# Patient Record
Sex: Female | Born: 1935 | Race: White | Hispanic: No | Smoking: Former smoker
Health system: Southern US, Community
[De-identification: ages and names within clinical notes are randomized; demographics above are authoritative.]

## PROBLEM LIST (undated history)

## (undated) DIAGNOSIS — T782XXA Anaphylactic shock, unspecified, initial encounter: Secondary | ICD-10-CM

## (undated) DIAGNOSIS — R339 Retention of urine, unspecified: Secondary | ICD-10-CM

## (undated) DIAGNOSIS — A0472 Enterocolitis due to Clostridium difficile, not specified as recurrent: Secondary | ICD-10-CM

## (undated) DIAGNOSIS — H9190 Unspecified hearing loss, unspecified ear: Secondary | ICD-10-CM

## (undated) DIAGNOSIS — J189 Pneumonia, unspecified organism: Secondary | ICD-10-CM

## (undated) DIAGNOSIS — I4892 Unspecified atrial flutter: Secondary | ICD-10-CM

## (undated) DIAGNOSIS — R5381 Other malaise: Secondary | ICD-10-CM

## (undated) DIAGNOSIS — I739 Peripheral vascular disease, unspecified: Secondary | ICD-10-CM

## (undated) DIAGNOSIS — G629 Polyneuropathy, unspecified: Secondary | ICD-10-CM

## (undated) DIAGNOSIS — K228 Other specified diseases of esophagus: Secondary | ICD-10-CM

## (undated) DIAGNOSIS — M8660 Other chronic osteomyelitis, unspecified site: Secondary | ICD-10-CM

## (undated) DIAGNOSIS — E875 Hyperkalemia: Secondary | ICD-10-CM

## (undated) DIAGNOSIS — I499 Cardiac arrhythmia, unspecified: Secondary | ICD-10-CM

## (undated) DIAGNOSIS — E871 Hypo-osmolality and hyponatremia: Secondary | ICD-10-CM

## (undated) DIAGNOSIS — R4701 Aphasia: Secondary | ICD-10-CM

## (undated) DIAGNOSIS — I5032 Chronic diastolic (congestive) heart failure: Secondary | ICD-10-CM

## (undated) DIAGNOSIS — Z794 Long term (current) use of insulin: Secondary | ICD-10-CM

## (undated) DIAGNOSIS — E1142 Type 2 diabetes mellitus with diabetic polyneuropathy: Secondary | ICD-10-CM

## (undated) DIAGNOSIS — I2510 Atherosclerotic heart disease of native coronary artery without angina pectoris: Secondary | ICD-10-CM

## (undated) DIAGNOSIS — D62 Acute posthemorrhagic anemia: Secondary | ICD-10-CM

## (undated) DIAGNOSIS — I219 Acute myocardial infarction, unspecified: Secondary | ICD-10-CM

## (undated) DIAGNOSIS — N184 Chronic kidney disease, stage 4 (severe): Secondary | ICD-10-CM

## (undated) DIAGNOSIS — I10 Essential (primary) hypertension: Secondary | ICD-10-CM

## (undated) DIAGNOSIS — I639 Cerebral infarction, unspecified: Secondary | ICD-10-CM

## (undated) DIAGNOSIS — L03115 Cellulitis of right lower limb: Secondary | ICD-10-CM

## (undated) DIAGNOSIS — Z8679 Personal history of other diseases of the circulatory system: Secondary | ICD-10-CM

## (undated) DIAGNOSIS — N390 Urinary tract infection, site not specified: Secondary | ICD-10-CM

## (undated) DIAGNOSIS — E039 Hypothyroidism, unspecified: Secondary | ICD-10-CM

## (undated) HISTORY — PX: EYE SURGERY: SHX253

## (undated) HISTORY — DX: Cellulitis of right lower limb: L03.115

## (undated) HISTORY — DX: Aphasia: R47.01

## (undated) HISTORY — DX: Hyperkalemia: E87.5

## (undated) HISTORY — PX: CARDIAC ELECTROPHYSIOLOGY MAPPING AND ABLATION: SHX1292

## (undated) HISTORY — DX: Hypo-osmolality and hyponatremia: E87.1

## (undated) HISTORY — DX: Chronic diastolic (congestive) heart failure: I50.32

## (undated) HISTORY — DX: Acute posthemorrhagic anemia: D62

## (undated) HISTORY — DX: Type 2 diabetes mellitus with diabetic polyneuropathy: E11.42

## (undated) HISTORY — PX: CHOLECYSTECTOMY: SHX55

## (undated) HISTORY — DX: Personal history of other diseases of the circulatory system: Z86.79

## (undated) HISTORY — PX: APPENDECTOMY: SHX54

## (undated) HISTORY — PX: SHOULDER SURGERY: SHX246

## (undated) HISTORY — DX: Other malaise: R53.81

## (undated) HISTORY — DX: Chronic kidney disease, stage 4 (severe): N18.4

## (undated) HISTORY — DX: Long term (current) use of insulin: Z79.4

## (undated) HISTORY — DX: Other chronic osteomyelitis, unspecified site: M86.60

## (undated) HISTORY — DX: Polyneuropathy, unspecified: G62.9

---

## 1997-06-27 ENCOUNTER — Other Ambulatory Visit: Admission: RE | Admit: 1997-06-27 | Discharge: 1997-06-27 | Payer: Self-pay | Admitting: *Deleted

## 1999-08-31 ENCOUNTER — Emergency Department (HOSPITAL_COMMUNITY): Admission: EM | Admit: 1999-08-31 | Discharge: 1999-09-01 | Payer: Self-pay

## 2000-08-08 ENCOUNTER — Encounter: Admission: RE | Admit: 2000-08-08 | Discharge: 2000-08-08 | Payer: Self-pay | Admitting: Internal Medicine

## 2000-08-08 ENCOUNTER — Encounter: Payer: Self-pay | Admitting: Internal Medicine

## 2000-10-05 ENCOUNTER — Encounter: Payer: Self-pay | Admitting: Internal Medicine

## 2000-10-05 ENCOUNTER — Encounter: Admission: RE | Admit: 2000-10-05 | Discharge: 2000-10-05 | Payer: Self-pay | Admitting: Internal Medicine

## 2001-01-25 DIAGNOSIS — I639 Cerebral infarction, unspecified: Secondary | ICD-10-CM

## 2001-01-25 HISTORY — DX: Cerebral infarction, unspecified: I63.9

## 2001-02-28 ENCOUNTER — Encounter: Admission: RE | Admit: 2001-02-28 | Discharge: 2001-05-29 | Payer: Self-pay | Admitting: Internal Medicine

## 2001-05-31 ENCOUNTER — Encounter: Admission: RE | Admit: 2001-05-31 | Discharge: 2001-08-29 | Payer: Self-pay | Admitting: Internal Medicine

## 2001-11-23 ENCOUNTER — Encounter: Admission: RE | Admit: 2001-11-23 | Discharge: 2002-02-21 | Payer: Self-pay | Admitting: Internal Medicine

## 2002-06-25 ENCOUNTER — Encounter: Payer: Self-pay | Admitting: Internal Medicine

## 2002-06-25 ENCOUNTER — Encounter: Admission: RE | Admit: 2002-06-25 | Discharge: 2002-06-25 | Payer: Self-pay | Admitting: Internal Medicine

## 2002-07-24 ENCOUNTER — Encounter: Admission: RE | Admit: 2002-07-24 | Discharge: 2002-10-22 | Payer: Self-pay | Admitting: Internal Medicine

## 2002-07-27 ENCOUNTER — Encounter: Payer: Self-pay | Admitting: Neurological Surgery

## 2002-07-31 ENCOUNTER — Inpatient Hospital Stay (HOSPITAL_COMMUNITY): Admission: RE | Admit: 2002-07-31 | Discharge: 2002-08-10 | Payer: Self-pay | Admitting: Neurological Surgery

## 2002-07-31 ENCOUNTER — Encounter: Payer: Self-pay | Admitting: Neurological Surgery

## 2002-08-06 ENCOUNTER — Encounter: Payer: Self-pay | Admitting: Neurological Surgery

## 2002-09-17 ENCOUNTER — Encounter: Admission: RE | Admit: 2002-09-17 | Discharge: 2002-12-16 | Payer: Self-pay | Admitting: Neurological Surgery

## 2003-01-04 ENCOUNTER — Encounter: Admission: RE | Admit: 2003-01-04 | Discharge: 2003-01-04 | Payer: Self-pay | Admitting: Neurological Surgery

## 2003-01-09 ENCOUNTER — Ambulatory Visit (HOSPITAL_COMMUNITY): Admission: RE | Admit: 2003-01-09 | Discharge: 2003-01-09 | Payer: Self-pay | Admitting: Orthopedic Surgery

## 2003-09-26 ENCOUNTER — Encounter: Admission: RE | Admit: 2003-09-26 | Discharge: 2003-09-26 | Payer: Self-pay | Admitting: Internal Medicine

## 2003-10-24 ENCOUNTER — Encounter: Admission: RE | Admit: 2003-10-24 | Discharge: 2003-10-24 | Payer: Self-pay | Admitting: Internal Medicine

## 2003-11-28 ENCOUNTER — Encounter: Admission: RE | Admit: 2003-11-28 | Discharge: 2003-11-28 | Payer: Self-pay | Admitting: Internal Medicine

## 2005-10-27 ENCOUNTER — Encounter: Admission: RE | Admit: 2005-10-27 | Discharge: 2005-10-27 | Payer: Self-pay | Admitting: Internal Medicine

## 2006-04-08 ENCOUNTER — Emergency Department (HOSPITAL_COMMUNITY): Admission: EM | Admit: 2006-04-08 | Discharge: 2006-04-08 | Payer: Self-pay | Admitting: Emergency Medicine

## 2006-04-10 ENCOUNTER — Emergency Department (HOSPITAL_COMMUNITY): Admission: EM | Admit: 2006-04-10 | Discharge: 2006-04-10 | Payer: Self-pay | Admitting: Emergency Medicine

## 2006-04-13 ENCOUNTER — Inpatient Hospital Stay (HOSPITAL_COMMUNITY): Admission: AD | Admit: 2006-04-13 | Discharge: 2006-04-19 | Payer: Self-pay | Admitting: Internal Medicine

## 2006-05-27 ENCOUNTER — Encounter: Payer: Self-pay | Admitting: Emergency Medicine

## 2006-05-28 ENCOUNTER — Inpatient Hospital Stay (HOSPITAL_COMMUNITY): Admission: EM | Admit: 2006-05-28 | Discharge: 2006-06-03 | Payer: Self-pay | Admitting: Internal Medicine

## 2006-07-07 ENCOUNTER — Ambulatory Visit: Payer: Self-pay | Admitting: *Deleted

## 2006-07-28 ENCOUNTER — Encounter (HOSPITAL_BASED_OUTPATIENT_CLINIC_OR_DEPARTMENT_OTHER): Admission: RE | Admit: 2006-07-28 | Discharge: 2006-10-18 | Payer: Self-pay | Admitting: Internal Medicine

## 2006-10-26 ENCOUNTER — Encounter (HOSPITAL_BASED_OUTPATIENT_CLINIC_OR_DEPARTMENT_OTHER): Admission: RE | Admit: 2006-10-26 | Discharge: 2007-01-24 | Payer: Self-pay | Admitting: Internal Medicine

## 2006-11-27 ENCOUNTER — Ambulatory Visit: Payer: Self-pay | Admitting: Cardiology

## 2006-11-27 ENCOUNTER — Inpatient Hospital Stay (HOSPITAL_COMMUNITY): Admission: EM | Admit: 2006-11-27 | Discharge: 2006-12-07 | Payer: Self-pay | Admitting: Emergency Medicine

## 2006-11-28 ENCOUNTER — Encounter (INDEPENDENT_AMBULATORY_CARE_PROVIDER_SITE_OTHER): Payer: Self-pay | Admitting: Internal Medicine

## 2006-11-29 ENCOUNTER — Encounter (INDEPENDENT_AMBULATORY_CARE_PROVIDER_SITE_OTHER): Payer: Self-pay | Admitting: *Deleted

## 2006-12-08 ENCOUNTER — Ambulatory Visit: Payer: Self-pay | Admitting: Family Medicine

## 2006-12-08 DIAGNOSIS — K559 Vascular disorder of intestine, unspecified: Secondary | ICD-10-CM | POA: Insufficient documentation

## 2006-12-08 DIAGNOSIS — L89609 Pressure ulcer of unspecified heel, unspecified stage: Secondary | ICD-10-CM | POA: Insufficient documentation

## 2006-12-08 DIAGNOSIS — H8109 Meniere's disease, unspecified ear: Secondary | ICD-10-CM | POA: Insufficient documentation

## 2006-12-08 DIAGNOSIS — E785 Hyperlipidemia, unspecified: Secondary | ICD-10-CM

## 2006-12-08 DIAGNOSIS — R1900 Intra-abdominal and pelvic swelling, mass and lump, unspecified site: Secondary | ICD-10-CM

## 2006-12-08 DIAGNOSIS — E1165 Type 2 diabetes mellitus with hyperglycemia: Secondary | ICD-10-CM

## 2006-12-08 DIAGNOSIS — E039 Hypothyroidism, unspecified: Secondary | ICD-10-CM | POA: Insufficient documentation

## 2006-12-08 DIAGNOSIS — N320 Bladder-neck obstruction: Secondary | ICD-10-CM | POA: Insufficient documentation

## 2006-12-08 DIAGNOSIS — Z950 Presence of cardiac pacemaker: Secondary | ICD-10-CM | POA: Insufficient documentation

## 2006-12-08 DIAGNOSIS — I2510 Atherosclerotic heart disease of native coronary artery without angina pectoris: Secondary | ICD-10-CM | POA: Insufficient documentation

## 2006-12-08 DIAGNOSIS — R197 Diarrhea, unspecified: Secondary | ICD-10-CM | POA: Insufficient documentation

## 2006-12-08 DIAGNOSIS — I10 Essential (primary) hypertension: Secondary | ICD-10-CM

## 2006-12-14 ENCOUNTER — Ambulatory Visit: Payer: Self-pay | Admitting: Family Medicine

## 2006-12-14 DIAGNOSIS — E1149 Type 2 diabetes mellitus with other diabetic neurological complication: Secondary | ICD-10-CM

## 2006-12-14 DIAGNOSIS — I8310 Varicose veins of unspecified lower extremity with inflammation: Secondary | ICD-10-CM | POA: Insufficient documentation

## 2007-01-30 ENCOUNTER — Encounter: Payer: Self-pay | Admitting: Family Medicine

## 2007-01-31 ENCOUNTER — Encounter (HOSPITAL_BASED_OUTPATIENT_CLINIC_OR_DEPARTMENT_OTHER): Admission: RE | Admit: 2007-01-31 | Discharge: 2007-05-01 | Payer: Self-pay | Admitting: Internal Medicine

## 2007-02-07 ENCOUNTER — Ambulatory Visit: Admission: RE | Admit: 2007-02-07 | Discharge: 2007-02-07 | Payer: Self-pay | Admitting: Gynecologic Oncology

## 2007-03-20 ENCOUNTER — Ambulatory Visit (HOSPITAL_COMMUNITY): Admission: RE | Admit: 2007-03-20 | Discharge: 2007-03-20 | Payer: Self-pay | Admitting: Gynecologic Oncology

## 2007-03-21 ENCOUNTER — Ambulatory Visit: Admission: RE | Admit: 2007-03-21 | Discharge: 2007-03-21 | Payer: Self-pay | Admitting: Gynecologic Oncology

## 2007-09-05 ENCOUNTER — Ambulatory Visit (HOSPITAL_COMMUNITY): Admission: RE | Admit: 2007-09-05 | Discharge: 2007-09-05 | Payer: Self-pay | Admitting: *Deleted

## 2007-09-05 ENCOUNTER — Encounter (INDEPENDENT_AMBULATORY_CARE_PROVIDER_SITE_OTHER): Payer: Self-pay | Admitting: *Deleted

## 2008-02-21 ENCOUNTER — Inpatient Hospital Stay (HOSPITAL_COMMUNITY): Admission: EM | Admit: 2008-02-21 | Discharge: 2008-02-26 | Payer: Self-pay | Admitting: Emergency Medicine

## 2008-02-21 ENCOUNTER — Encounter: Payer: Self-pay | Admitting: Family Medicine

## 2008-02-21 LAB — CONVERTED CEMR LAB
HDL: 67 mg/dL
HDL: 67 mg/dL
HDL: 67 mg/dL
HDL: 67 mg/dL
LDL Cholesterol: 68 mg/dL
LDL Cholesterol: 68 mg/dL
LDL Cholesterol: 68 mg/dL

## 2008-02-27 ENCOUNTER — Encounter: Payer: Self-pay | Admitting: Family Medicine

## 2008-02-27 DIAGNOSIS — G4700 Insomnia, unspecified: Secondary | ICD-10-CM | POA: Insufficient documentation

## 2008-02-27 DIAGNOSIS — R351 Nocturia: Secondary | ICD-10-CM | POA: Insufficient documentation

## 2008-02-27 DIAGNOSIS — Z9181 History of falling: Secondary | ICD-10-CM | POA: Insufficient documentation

## 2008-03-27 ENCOUNTER — Ambulatory Visit: Payer: Self-pay | Admitting: Family Medicine

## 2008-03-27 ENCOUNTER — Encounter (INDEPENDENT_AMBULATORY_CARE_PROVIDER_SITE_OTHER): Payer: Self-pay | Admitting: Family Medicine

## 2008-03-27 DIAGNOSIS — B3749 Other urogenital candidiasis: Secondary | ICD-10-CM | POA: Insufficient documentation

## 2008-03-27 DIAGNOSIS — R05 Cough: Secondary | ICD-10-CM

## 2008-03-27 DIAGNOSIS — R269 Unspecified abnormalities of gait and mobility: Secondary | ICD-10-CM | POA: Insufficient documentation

## 2008-03-27 DIAGNOSIS — R259 Unspecified abnormal involuntary movements: Secondary | ICD-10-CM | POA: Insufficient documentation

## 2008-04-01 ENCOUNTER — Encounter (INDEPENDENT_AMBULATORY_CARE_PROVIDER_SITE_OTHER): Payer: Self-pay | Admitting: Family Medicine

## 2008-05-15 ENCOUNTER — Encounter: Admission: RE | Admit: 2008-05-15 | Discharge: 2008-05-15 | Payer: Self-pay | Admitting: Internal Medicine

## 2008-06-04 ENCOUNTER — Ambulatory Visit: Payer: Self-pay | Admitting: Internal Medicine

## 2008-06-04 ENCOUNTER — Inpatient Hospital Stay (HOSPITAL_COMMUNITY): Admission: EM | Admit: 2008-06-04 | Discharge: 2008-06-19 | Payer: Self-pay | Admitting: Emergency Medicine

## 2008-06-07 ENCOUNTER — Encounter (INDEPENDENT_AMBULATORY_CARE_PROVIDER_SITE_OTHER): Payer: Self-pay | Admitting: Cardiology

## 2008-06-12 ENCOUNTER — Encounter (INDEPENDENT_AMBULATORY_CARE_PROVIDER_SITE_OTHER): Payer: Self-pay | Admitting: Cardiology

## 2008-06-14 ENCOUNTER — Encounter: Payer: Self-pay | Admitting: Internal Medicine

## 2008-06-20 ENCOUNTER — Encounter: Payer: Self-pay | Admitting: Internal Medicine

## 2008-06-25 ENCOUNTER — Encounter: Payer: Self-pay | Admitting: Internal Medicine

## 2008-06-26 ENCOUNTER — Encounter: Payer: Self-pay | Admitting: Internal Medicine

## 2008-06-27 ENCOUNTER — Encounter: Payer: Self-pay | Admitting: Internal Medicine

## 2008-06-28 ENCOUNTER — Encounter: Payer: Self-pay | Admitting: Internal Medicine

## 2008-07-01 ENCOUNTER — Encounter: Payer: Self-pay | Admitting: Internal Medicine

## 2008-07-02 ENCOUNTER — Encounter: Payer: Self-pay | Admitting: Internal Medicine

## 2008-07-03 ENCOUNTER — Encounter: Payer: Self-pay | Admitting: Internal Medicine

## 2008-07-04 ENCOUNTER — Encounter: Payer: Self-pay | Admitting: Internal Medicine

## 2008-07-11 ENCOUNTER — Inpatient Hospital Stay (HOSPITAL_COMMUNITY): Admission: EM | Admit: 2008-07-11 | Discharge: 2008-07-15 | Payer: Self-pay | Admitting: Internal Medicine

## 2008-07-11 ENCOUNTER — Encounter: Payer: Self-pay | Admitting: Emergency Medicine

## 2008-07-11 ENCOUNTER — Encounter: Payer: Self-pay | Admitting: Internal Medicine

## 2008-07-11 ENCOUNTER — Ambulatory Visit: Payer: Self-pay | Admitting: Diagnostic Radiology

## 2008-07-15 ENCOUNTER — Encounter: Payer: Self-pay | Admitting: Internal Medicine

## 2008-07-19 ENCOUNTER — Encounter: Payer: Self-pay | Admitting: Internal Medicine

## 2008-07-25 ENCOUNTER — Ambulatory Visit: Payer: Self-pay | Admitting: Internal Medicine

## 2008-07-25 DIAGNOSIS — I4892 Unspecified atrial flutter: Secondary | ICD-10-CM

## 2008-12-25 ENCOUNTER — Ambulatory Visit: Admission: RE | Admit: 2008-12-25 | Discharge: 2008-12-25 | Payer: Self-pay | Admitting: Gynecologic Oncology

## 2009-01-23 HISTORY — PX: NM MYOCAR PERF WALL MOTION: HXRAD629

## 2009-05-24 ENCOUNTER — Inpatient Hospital Stay (HOSPITAL_COMMUNITY): Admission: EM | Admit: 2009-05-24 | Discharge: 2009-05-30 | Payer: Self-pay | Admitting: Emergency Medicine

## 2009-08-19 ENCOUNTER — Ambulatory Visit (HOSPITAL_COMMUNITY): Admission: RE | Admit: 2009-08-19 | Discharge: 2009-08-19 | Payer: Self-pay | Admitting: Internal Medicine

## 2010-01-20 ENCOUNTER — Inpatient Hospital Stay (HOSPITAL_COMMUNITY)
Admission: EM | Admit: 2010-01-20 | Discharge: 2010-01-24 | Payer: Self-pay | Source: Home / Self Care | Attending: Internal Medicine | Admitting: Internal Medicine

## 2010-02-15 ENCOUNTER — Encounter: Payer: Self-pay | Admitting: Internal Medicine

## 2010-02-16 ENCOUNTER — Encounter: Payer: Self-pay | Admitting: Internal Medicine

## 2010-04-06 LAB — CBC
HCT: 30.9 % — ABNORMAL LOW (ref 36.0–46.0)
Hemoglobin: 11.2 g/dL — ABNORMAL LOW (ref 12.0–15.0)
Hemoglobin: 12.2 g/dL (ref 12.0–15.0)
Hemoglobin: 9.8 g/dL — ABNORMAL LOW (ref 12.0–15.0)
MCH: 28.9 pg (ref 26.0–34.0)
MCH: 29.3 pg (ref 26.0–34.0)
MCHC: 33.1 g/dL (ref 30.0–36.0)
MCHC: 33.4 g/dL (ref 30.0–36.0)
MCV: 87.1 fL (ref 78.0–100.0)
MCV: 87.5 fL (ref 78.0–100.0)
RBC: 3.5 MIL/uL — ABNORMAL LOW (ref 3.87–5.11)
RBC: 3.88 MIL/uL (ref 3.87–5.11)
RDW: 13.5 % (ref 11.5–15.5)
WBC: 8.7 10*3/uL (ref 4.0–10.5)

## 2010-04-06 LAB — BLOOD GAS, VENOUS
Acid-Base Excess: 0.7 mmol/L (ref 0.0–2.0)
O2 Saturation: 34.1 %
Patient temperature: 98.6
TCO2: 24.3 mmol/L (ref 0–100)

## 2010-04-06 LAB — GLUCOSE, CAPILLARY
Glucose-Capillary: 201 mg/dL — ABNORMAL HIGH (ref 70–99)
Glucose-Capillary: 221 mg/dL — ABNORMAL HIGH (ref 70–99)
Glucose-Capillary: 267 mg/dL — ABNORMAL HIGH (ref 70–99)
Glucose-Capillary: 269 mg/dL — ABNORMAL HIGH (ref 70–99)
Glucose-Capillary: 276 mg/dL — ABNORMAL HIGH (ref 70–99)
Glucose-Capillary: 295 mg/dL — ABNORMAL HIGH (ref 70–99)
Glucose-Capillary: 325 mg/dL — ABNORMAL HIGH (ref 70–99)
Glucose-Capillary: 398 mg/dL — ABNORMAL HIGH (ref 70–99)
Glucose-Capillary: 525 mg/dL — ABNORMAL HIGH (ref 70–99)

## 2010-04-06 LAB — URINALYSIS, ROUTINE W REFLEX MICROSCOPIC
Bilirubin Urine: NEGATIVE
Nitrite: NEGATIVE
Protein, ur: 30 mg/dL — AB
Specific Gravity, Urine: 1.021 (ref 1.005–1.030)
Urobilinogen, UA: 0.2 mg/dL (ref 0.0–1.0)

## 2010-04-06 LAB — URINE MICROSCOPIC-ADD ON

## 2010-04-06 LAB — BASIC METABOLIC PANEL
BUN: 14 mg/dL (ref 6–23)
BUN: 21 mg/dL (ref 6–23)
CO2: 24 mEq/L (ref 19–32)
CO2: 25 mEq/L (ref 19–32)
Calcium: 8.5 mg/dL (ref 8.4–10.5)
Chloride: 103 mEq/L (ref 96–112)
Chloride: 104 mEq/L (ref 96–112)
Chloride: 97 mEq/L (ref 96–112)
Creatinine, Ser: 1.14 mg/dL (ref 0.4–1.2)
Creatinine, Ser: 1.63 mg/dL — ABNORMAL HIGH (ref 0.4–1.2)
GFR calc Af Amer: 36 mL/min — ABNORMAL LOW (ref >60)
GFR calc Af Amer: 51 mL/min — ABNORMAL LOW (ref >60)
GFR calc non Af Amer: 42 mL/min — ABNORMAL LOW (ref >60)
Glucose, Bld: 399 mg/dL — ABNORMAL HIGH (ref 70–99)
Potassium: 4.7 mEq/L (ref 3.5–5.1)
Sodium: 130 mEq/L — ABNORMAL LOW (ref 135–145)
Sodium: 132 mEq/L — ABNORMAL LOW (ref 135–145)

## 2010-04-06 LAB — COMPREHENSIVE METABOLIC PANEL
Albumin: 2.8 g/dL — ABNORMAL LOW (ref 3.5–5.2)
Alkaline Phosphatase: 87 U/L (ref 39–117)
BUN: 38 mg/dL — ABNORMAL HIGH (ref 6–23)
CO2: 23 mEq/L (ref 19–32)
Chloride: 94 mEq/L — ABNORMAL LOW (ref 96–112)
Creatinine, Ser: 1.81 mg/dL — ABNORMAL HIGH (ref 0.4–1.2)
GFR calc non Af Amer: 26 mL/min — ABNORMAL LOW (ref >60)
Potassium: 5.2 mEq/L — ABNORMAL HIGH (ref 3.5–5.1)
Total Bilirubin: 0.6 mg/dL (ref 0.3–1.2)

## 2010-04-06 LAB — DIFFERENTIAL
Basophils Absolute: 0 10*3/uL (ref 0.0–0.1)
Basophils Relative: 0 % (ref 0–1)
Eosinophils Absolute: 0.6 10*3/uL (ref 0.0–0.7)
Eosinophils Relative: 5 % (ref 0–5)
Lymphocytes Relative: 14 % (ref 12–46)
Lymphs Abs: 0.7 10*3/uL (ref 0.7–4.0)
Lymphs Abs: 1.2 10*3/uL (ref 0.7–4.0)
Monocytes Absolute: 0.5 10*3/uL (ref 0.1–1.0)
Monocytes Absolute: 0.7 10*3/uL (ref 0.1–1.0)
Monocytes Relative: 5 % (ref 3–12)
Neutro Abs: 6.1 10*3/uL (ref 1.7–7.7)
Neutrophils Relative %: 84 % — ABNORMAL HIGH (ref 43–77)

## 2010-04-06 LAB — URINE CULTURE
Colony Count: >=100000
Culture  Setup Time: 201112290117
Special Requests: NEGATIVE

## 2010-04-06 LAB — POCT I-STAT, CHEM 8
BUN: 56 mg/dL — ABNORMAL HIGH (ref 6–23)
Calcium, Ion: 1.13 mmol/L (ref 1.12–1.32)
Chloride: 94 mEq/L — ABNORMAL LOW (ref 96–112)
Creatinine, Ser: 2.1 mg/dL — ABNORMAL HIGH (ref 0.4–1.2)
Glucose, Bld: 511 mg/dL — ABNORMAL HIGH (ref 70–99)
TCO2: 29 mmol/L (ref 0–100)

## 2010-04-06 LAB — OVA AND PARASITE EXAMINATION

## 2010-04-06 LAB — STOOL CULTURE

## 2010-04-06 LAB — PROTIME-INR: INR: 1.1 (ref 0.00–1.49)

## 2010-04-06 LAB — HEMOCCULT GUIAC POC 1CARD (OFFICE): Fecal Occult Bld: NEGATIVE

## 2010-04-06 LAB — HEMOGLOBIN A1C: Mean Plasma Glucose: 295 mg/dL — ABNORMAL HIGH (ref <117)

## 2010-04-14 LAB — CARDIAC PANEL(CRET KIN+CKTOT+MB+TROPI)
CK, MB: 1.2 ng/mL (ref 0.3–4.0)
Total CK: 35 U/L (ref 7–177)
Total CK: 42 U/L (ref 7–177)
Total CK: 72 U/L (ref 7–177)

## 2010-04-14 LAB — CBC
HCT: 32.8 % — ABNORMAL LOW (ref 36.0–46.0)
HCT: 36.2 % (ref 36.0–46.0)
Hemoglobin: 10.8 g/dL — ABNORMAL LOW (ref 12.0–15.0)
Hemoglobin: 11.2 g/dL — ABNORMAL LOW (ref 12.0–15.0)
MCHC: 33.1 g/dL (ref 30.0–36.0)
MCHC: 34.5 g/dL (ref 30.0–36.0)
MCHC: 33.6 g/dL (ref 30.0–36.0)
MCV: 86.3 fL (ref 78.0–100.0)
MCV: 85.7 fL (ref 78.0–100.0)
Platelets: 283 10*3/uL (ref 150–400)
Platelets: 287 10*3/uL (ref 150–400)
Platelets: 302 10*3/uL (ref 150–400)
Platelets: 316 10*3/uL (ref 150–400)
RBC: 3.66 MIL/uL — ABNORMAL LOW (ref 3.87–5.11)
RBC: 3.81 MIL/uL — ABNORMAL LOW (ref 3.87–5.11)
RDW: 14.1 % (ref 11.5–15.5)
RDW: 14.2 % (ref 11.5–15.5)
RDW: 14.3 % (ref 11.5–15.5)
WBC: 7.9 10*3/uL (ref 4.0–10.5)
WBC: 12.3 10*3/uL — ABNORMAL HIGH (ref 4.0–10.5)

## 2010-04-14 LAB — COMPREHENSIVE METABOLIC PANEL
ALT: 12 U/L (ref 0–35)
AST: 15 U/L (ref 0–37)
CO2: 22 mEq/L (ref 19–32)
Chloride: 98 mEq/L (ref 96–112)
Creatinine, Ser: 2.68 mg/dL — ABNORMAL HIGH (ref 0.4–1.2)
GFR calc Af Amer: 20 mL/min — ABNORMAL LOW (ref >60)
GFR calc non Af Amer: 17 mL/min — ABNORMAL LOW (ref >60)
Glucose, Bld: 141 mg/dL — ABNORMAL HIGH (ref 70–99)
Total Bilirubin: 0.7 mg/dL (ref 0.3–1.2)

## 2010-04-14 LAB — GLUCOSE, CAPILLARY
Glucose-Capillary: 157 mg/dL — ABNORMAL HIGH (ref 70–99)
Glucose-Capillary: 180 mg/dL — ABNORMAL HIGH (ref 70–99)
Glucose-Capillary: 193 mg/dL — ABNORMAL HIGH (ref 70–99)
Glucose-Capillary: 204 mg/dL — ABNORMAL HIGH (ref 70–99)
Glucose-Capillary: 204 mg/dL — ABNORMAL HIGH (ref 70–99)
Glucose-Capillary: 229 mg/dL — ABNORMAL HIGH (ref 70–99)
Glucose-Capillary: 231 mg/dL — ABNORMAL HIGH (ref 70–99)
Glucose-Capillary: 275 mg/dL — ABNORMAL HIGH (ref 70–99)
Glucose-Capillary: 293 mg/dL — ABNORMAL HIGH (ref 70–99)
Glucose-Capillary: 310 mg/dL — ABNORMAL HIGH (ref 70–99)
Glucose-Capillary: 327 mg/dL — ABNORMAL HIGH (ref 70–99)
Glucose-Capillary: 333 mg/dL — ABNORMAL HIGH (ref 70–99)
Glucose-Capillary: 377 mg/dL — ABNORMAL HIGH (ref 70–99)
Glucose-Capillary: 120 mg/dL — ABNORMAL HIGH (ref 70–99)
Glucose-Capillary: 131 mg/dL — ABNORMAL HIGH (ref 70–99)
Glucose-Capillary: 145 mg/dL — ABNORMAL HIGH (ref 70–99)

## 2010-04-14 LAB — DIFFERENTIAL
Basophils Absolute: 0 10*3/uL (ref 0.0–0.1)
Basophils Absolute: 0.1 10*3/uL (ref 0.0–0.1)
Eosinophils Absolute: 0.4 10*3/uL (ref 0.0–0.7)
Eosinophils Relative: 5 % (ref 0–5)
Lymphocytes Relative: 23 % (ref 12–46)
Lymphs Abs: 1.5 10*3/uL (ref 0.7–4.0)
Monocytes Absolute: 0.8 10*3/uL (ref 0.1–1.0)
Monocytes Absolute: 0.5 10*3/uL (ref 0.1–1.0)
Monocytes Relative: 4 % (ref 3–12)

## 2010-04-14 LAB — URINE CULTURE

## 2010-04-14 LAB — BASIC METABOLIC PANEL
BUN: 27 mg/dL — ABNORMAL HIGH (ref 6–23)
BUN: 28 mg/dL — ABNORMAL HIGH (ref 6–23)
BUN: 32 mg/dL — ABNORMAL HIGH (ref 6–23)
BUN: 37 mg/dL — ABNORMAL HIGH (ref 6–23)
CO2: 26 mEq/L (ref 19–32)
CO2: 26 mEq/L (ref 19–32)
CO2: 27 mEq/L (ref 19–32)
CO2: 27 mEq/L (ref 19–32)
CO2: 23 mEq/L (ref 19–32)
Calcium: 8.5 mg/dL (ref 8.4–10.5)
Calcium: 8.7 mg/dL (ref 8.4–10.5)
Calcium: 8.7 mg/dL (ref 8.4–10.5)
Chloride: 101 mEq/L (ref 96–112)
Creatinine, Ser: 1.45 mg/dL — ABNORMAL HIGH (ref 0.4–1.2)
Creatinine, Ser: 1.46 mg/dL — ABNORMAL HIGH (ref 0.4–1.2)
Creatinine, Ser: 1.6 mg/dL — ABNORMAL HIGH (ref 0.4–1.2)
Creatinine, Ser: 1.88 mg/dL — ABNORMAL HIGH (ref 0.4–1.2)
GFR calc Af Amer: 41 mL/min — ABNORMAL LOW (ref >60)
GFR calc Af Amer: 47 mL/min — ABNORMAL LOW (ref >60)
GFR calc non Af Amer: 33 mL/min — ABNORMAL LOW (ref >60)
GFR calc non Af Amer: 34 mL/min — ABNORMAL LOW (ref >60)
GFR calc non Af Amer: 34 mL/min — ABNORMAL LOW (ref >60)
GFR calc non Af Amer: 39 mL/min — ABNORMAL LOW (ref >60)
Glucose, Bld: 192 mg/dL — ABNORMAL HIGH (ref 70–99)
Glucose, Bld: 236 mg/dL — ABNORMAL HIGH (ref 70–99)
Glucose, Bld: 245 mg/dL — ABNORMAL HIGH (ref 70–99)
Glucose, Bld: 312 mg/dL — ABNORMAL HIGH (ref 70–99)
Glucose, Bld: 128 mg/dL — ABNORMAL HIGH (ref 70–99)
Potassium: 4.5 mEq/L (ref 3.5–5.1)
Potassium: 4.9 mEq/L (ref 3.5–5.1)
Potassium: 5 mEq/L (ref 3.5–5.1)
Potassium: 5.2 mEq/L — ABNORMAL HIGH (ref 3.5–5.1)
Sodium: 132 mEq/L — ABNORMAL LOW (ref 135–145)
Sodium: 136 mEq/L (ref 135–145)
Sodium: 137 mEq/L (ref 135–145)

## 2010-04-14 LAB — CULTURE, BLOOD (ROUTINE X 2): Culture: NO GROWTH

## 2010-04-14 LAB — HEMOGLOBIN A1C: Mean Plasma Glucose: 203 mg/dL — ABNORMAL HIGH (ref <117)

## 2010-04-14 LAB — URINALYSIS, ROUTINE W REFLEX MICROSCOPIC
Glucose, UA: NEGATIVE mg/dL
Nitrite: NEGATIVE
Specific Gravity, Urine: 1.01 (ref 1.005–1.030)
pH: 5.5 (ref 5.0–8.0)

## 2010-04-14 LAB — URINE MICROSCOPIC-ADD ON

## 2010-04-14 LAB — LIPID PANEL: Cholesterol: 152 mg/dL (ref 0–200)

## 2010-04-14 LAB — LACTIC ACID, PLASMA: Lactic Acid, Venous: 0.8 mmol/L (ref 0.5–2.2)

## 2010-04-14 LAB — CK TOTAL AND CKMB (NOT AT ARMC): Relative Index: INVALID (ref 0.0–2.5)

## 2010-05-04 LAB — BASIC METABOLIC PANEL
BUN: 30 mg/dL — ABNORMAL HIGH (ref 6–23)
BUN: 38 mg/dL — ABNORMAL HIGH (ref 6–23)
BUN: 39 mg/dL — ABNORMAL HIGH (ref 6–23)
CO2: 25 mEq/L (ref 19–32)
CO2: 22 mEq/L (ref 19–32)
Calcium: 8.6 mg/dL (ref 8.4–10.5)
Calcium: 9 mg/dL (ref 8.4–10.5)
Chloride: 102 mEq/L (ref 96–112)
Chloride: 104 mEq/L (ref 96–112)
Chloride: 105 mEq/L (ref 96–112)
Creatinine, Ser: 1.58 mg/dL — ABNORMAL HIGH (ref 0.4–1.2)
Creatinine, Ser: 1.6 mg/dL — ABNORMAL HIGH (ref 0.4–1.2)
GFR calc Af Amer: 37 mL/min — ABNORMAL LOW (ref >60)
GFR calc Af Amer: 39 mL/min — ABNORMAL LOW (ref >60)
GFR calc non Af Amer: 32 mL/min — ABNORMAL LOW (ref >60)
GFR calc non Af Amer: 30 mL/min — ABNORMAL LOW (ref >60)
Glucose, Bld: 198 mg/dL — ABNORMAL HIGH (ref 70–99)
Glucose, Bld: 309 mg/dL — ABNORMAL HIGH (ref 70–99)
Glucose, Bld: 292 mg/dL — ABNORMAL HIGH (ref 70–99)
Potassium: 4.2 mEq/L (ref 3.5–5.1)
Potassium: 4.6 mEq/L (ref 3.5–5.1)
Potassium: 5.8 mEq/L — ABNORMAL HIGH (ref 3.5–5.1)
Sodium: 130 mEq/L — ABNORMAL LOW (ref 135–145)
Sodium: 136 mEq/L (ref 135–145)

## 2010-05-04 LAB — GLUCOSE, CAPILLARY
Glucose-Capillary: 268 mg/dL — ABNORMAL HIGH (ref 70–99)
Glucose-Capillary: 302 mg/dL — ABNORMAL HIGH (ref 70–99)
Glucose-Capillary: 311 mg/dL — ABNORMAL HIGH (ref 70–99)
Glucose-Capillary: 334 mg/dL — ABNORMAL HIGH (ref 70–99)
Glucose-Capillary: 383 mg/dL — ABNORMAL HIGH (ref 70–99)
Glucose-Capillary: 406 mg/dL — ABNORMAL HIGH (ref 70–99)
Glucose-Capillary: 410 mg/dL — ABNORMAL HIGH (ref 70–99)
Glucose-Capillary: 411 mg/dL — ABNORMAL HIGH (ref 70–99)

## 2010-05-04 LAB — CBC
HCT: 29 % — ABNORMAL LOW (ref 36.0–46.0)
HCT: 30.7 % — ABNORMAL LOW (ref 36.0–46.0)
HCT: 32.3 % — ABNORMAL LOW (ref 36.0–46.0)
Hemoglobin: 10.3 g/dL — ABNORMAL LOW (ref 12.0–15.0)
Hemoglobin: 10.7 g/dL — ABNORMAL LOW (ref 12.0–15.0)
Hemoglobin: 9.7 g/dL — ABNORMAL LOW (ref 12.0–15.0)
MCHC: 33.6 g/dL (ref 30.0–36.0)
MCHC: 33.7 g/dL (ref 30.0–36.0)
MCHC: 32.8 g/dL (ref 30.0–36.0)
MCV: 85.7 fL (ref 78.0–100.0)
MCV: 86.2 fL (ref 78.0–100.0)
MCV: 86.5 fL (ref 78.0–100.0)
Platelets: 205 10*3/uL (ref 150–400)
Platelets: 231 10*3/uL (ref 150–400)
RBC: 3.58 MIL/uL — ABNORMAL LOW (ref 3.87–5.11)
RBC: 3.73 MIL/uL — ABNORMAL LOW (ref 3.87–5.11)
RBC: 3.97 MIL/uL (ref 3.87–5.11)
RDW: 16 % — ABNORMAL HIGH (ref 11.5–15.5)
RDW: 16 % — ABNORMAL HIGH (ref 11.5–15.5)
RDW: 16.2 % — ABNORMAL HIGH (ref 11.5–15.5)
RDW: 15.2 % (ref 11.5–15.5)
WBC: 7.9 10*3/uL (ref 4.0–10.5)
WBC: 9.6 10*3/uL (ref 4.0–10.5)

## 2010-05-04 LAB — DIFFERENTIAL
Basophils Absolute: 0.2 10*3/uL — ABNORMAL HIGH (ref 0.0–0.1)
Basophils Relative: 2 % — ABNORMAL HIGH (ref 0–1)
Eosinophils Absolute: 0.3 10*3/uL (ref 0.0–0.7)
Neutro Abs: 7.8 10*3/uL — ABNORMAL HIGH (ref 1.7–7.7)
Neutrophils Relative %: 71 % (ref 43–77)

## 2010-05-04 LAB — RENAL FUNCTION PANEL
BUN: 31 mg/dL — ABNORMAL HIGH (ref 6–23)
CO2: 25 mEq/L (ref 19–32)
Calcium: 8.2 mg/dL — ABNORMAL LOW (ref 8.4–10.5)
Chloride: 103 mEq/L (ref 96–112)
Creatinine, Ser: 1.69 mg/dL — ABNORMAL HIGH (ref 0.4–1.2)
GFR calc Af Amer: 35 mL/min — ABNORMAL LOW (ref >60)
GFR calc non Af Amer: 29 mL/min — ABNORMAL LOW (ref >60)
Glucose, Bld: 254 mg/dL — ABNORMAL HIGH (ref 70–99)
Glucose, Bld: 424 mg/dL — ABNORMAL HIGH (ref 70–99)
Phosphorus: 2.9 mg/dL (ref 2.3–4.6)
Potassium: 4.6 mEq/L (ref 3.5–5.1)
Sodium: 133 mEq/L — ABNORMAL LOW (ref 135–145)

## 2010-05-04 LAB — PROTIME-INR
INR: 3.4 — ABNORMAL HIGH (ref 0.00–1.49)
INR: 3.3 — ABNORMAL HIGH (ref 0.00–1.49)
Prothrombin Time: 18.3 seconds — ABNORMAL HIGH (ref 11.6–15.2)
Prothrombin Time: 22.3 seconds — ABNORMAL HIGH (ref 11.6–15.2)
Prothrombin Time: 37.2 seconds — ABNORMAL HIGH (ref 11.6–15.2)

## 2010-05-04 LAB — URINALYSIS, ROUTINE W REFLEX MICROSCOPIC
Bilirubin Urine: NEGATIVE
Ketones, ur: NEGATIVE mg/dL
Nitrite: NEGATIVE
Specific Gravity, Urine: 1.012 (ref 1.005–1.030)
Urobilinogen, UA: 0.2 mg/dL (ref 0.0–1.0)
pH: 5.5 (ref 5.0–8.0)

## 2010-05-04 LAB — GLUCOSE, RANDOM: Glucose, Bld: 434 mg/dL — ABNORMAL HIGH (ref 70–99)

## 2010-05-04 LAB — URINE MICROSCOPIC-ADD ON

## 2010-05-04 LAB — APTT
aPTT: 45 seconds — ABNORMAL HIGH (ref 24–37)
aPTT: 60 seconds — ABNORMAL HIGH (ref 24–37)

## 2010-05-04 LAB — URINE CULTURE: Culture: NO GROWTH

## 2010-05-05 LAB — URINALYSIS, ROUTINE W REFLEX MICROSCOPIC
Bilirubin Urine: NEGATIVE
Ketones, ur: NEGATIVE mg/dL
Nitrite: NEGATIVE
Protein, ur: NEGATIVE mg/dL
pH: 5 (ref 5.0–8.0)

## 2010-05-05 LAB — BASIC METABOLIC PANEL
BUN: 14 mg/dL (ref 6–23)
BUN: 14 mg/dL (ref 6–23)
BUN: 14 mg/dL (ref 6–23)
BUN: 15 mg/dL (ref 6–23)
BUN: 21 mg/dL (ref 6–23)
BUN: 25 mg/dL — ABNORMAL HIGH (ref 6–23)
CO2: 25 mEq/L (ref 19–32)
CO2: 28 mEq/L (ref 19–32)
CO2: 29 mEq/L (ref 19–32)
CO2: 30 mEq/L (ref 19–32)
CO2: 30 mEq/L (ref 19–32)
CO2: 32 mEq/L (ref 19–32)
CO2: 32 mEq/L (ref 19–32)
CO2: 25 mEq/L (ref 19–32)
CO2: 26 mEq/L (ref 19–32)
CO2: 27 mEq/L (ref 19–32)
Calcium: 10.1 mg/dL (ref 8.4–10.5)
Calcium: 9.1 mg/dL (ref 8.4–10.5)
Calcium: 8.4 mg/dL (ref 8.4–10.5)
Calcium: 9 mg/dL (ref 8.4–10.5)
Chloride: 101 mEq/L (ref 96–112)
Chloride: 91 mEq/L — ABNORMAL LOW (ref 96–112)
Chloride: 94 mEq/L — ABNORMAL LOW (ref 96–112)
Chloride: 99 mEq/L (ref 96–112)
Chloride: 98 mEq/L (ref 96–112)
Chloride: 98 mEq/L (ref 96–112)
Creatinine, Ser: 1.54 mg/dL — ABNORMAL HIGH (ref 0.4–1.2)
Creatinine, Ser: 1.59 mg/dL — ABNORMAL HIGH (ref 0.4–1.2)
Creatinine, Ser: 1.83 mg/dL — ABNORMAL HIGH (ref 0.4–1.2)
Creatinine, Ser: 1.86 mg/dL — ABNORMAL HIGH (ref 0.4–1.2)
Creatinine, Ser: 1.63 mg/dL — ABNORMAL HIGH (ref 0.4–1.2)
Creatinine, Ser: 1.67 mg/dL — ABNORMAL HIGH (ref 0.4–1.2)
Creatinine, Ser: 2.59 mg/dL — ABNORMAL HIGH (ref 0.4–1.2)
GFR calc Af Amer: 29 mL/min — ABNORMAL LOW (ref >60)
GFR calc Af Amer: 32 mL/min — ABNORMAL LOW (ref >60)
GFR calc Af Amer: 37 mL/min — ABNORMAL LOW (ref >60)
GFR calc Af Amer: 38 mL/min — ABNORMAL LOW (ref >60)
GFR calc Af Amer: 21 mL/min — ABNORMAL LOW (ref >60)
GFR calc Af Amer: 36 mL/min — ABNORMAL LOW (ref >60)
GFR calc non Af Amer: 26 mL/min — ABNORMAL LOW (ref >60)
GFR calc non Af Amer: 26 mL/min — ABNORMAL LOW (ref >60)
GFR calc non Af Amer: 31 mL/min — ABNORMAL LOW (ref >60)
GFR calc non Af Amer: 18 mL/min — ABNORMAL LOW (ref >60)
Glucose, Bld: 218 mg/dL — ABNORMAL HIGH (ref 70–99)
Glucose, Bld: 273 mg/dL — ABNORMAL HIGH (ref 70–99)
Glucose, Bld: 304 mg/dL — ABNORMAL HIGH (ref 70–99)
Glucose, Bld: 305 mg/dL — ABNORMAL HIGH (ref 70–99)
Glucose, Bld: 115 mg/dL — ABNORMAL HIGH (ref 70–99)
Glucose, Bld: 120 mg/dL — ABNORMAL HIGH (ref 70–99)
Glucose, Bld: 316 mg/dL — ABNORMAL HIGH (ref 70–99)
Potassium: 4.4 mEq/L (ref 3.5–5.1)
Potassium: 4.5 mEq/L (ref 3.5–5.1)
Potassium: 4.6 mEq/L (ref 3.5–5.1)
Potassium: 4.8 mEq/L (ref 3.5–5.1)
Potassium: 5.4 mEq/L — ABNORMAL HIGH (ref 3.5–5.1)
Potassium: 5.9 mEq/L — ABNORMAL HIGH (ref 3.5–5.1)
Potassium: 5.3 mEq/L — ABNORMAL HIGH (ref 3.5–5.1)
Sodium: 128 mEq/L — ABNORMAL LOW (ref 135–145)
Sodium: 132 mEq/L — ABNORMAL LOW (ref 135–145)
Sodium: 132 mEq/L — ABNORMAL LOW (ref 135–145)
Sodium: 134 mEq/L — ABNORMAL LOW (ref 135–145)
Sodium: 134 mEq/L — ABNORMAL LOW (ref 135–145)
Sodium: 129 mEq/L — ABNORMAL LOW (ref 135–145)

## 2010-05-05 LAB — CBC
HCT: 29.8 % — ABNORMAL LOW (ref 36.0–46.0)
HCT: 36.9 % (ref 36.0–46.0)
HCT: 37 % (ref 36.0–46.0)
HCT: 37.1 % (ref 36.0–46.0)
HCT: 37.3 % (ref 36.0–46.0)
HCT: 38 % (ref 36.0–46.0)
HCT: 38.9 % (ref 36.0–46.0)
HCT: 24.8 % — ABNORMAL LOW (ref 36.0–46.0)
HCT: 28.2 % — ABNORMAL LOW (ref 36.0–46.0)
HCT: 28.8 % — ABNORMAL LOW (ref 36.0–46.0)
HCT: 32.7 % — ABNORMAL LOW (ref 36.0–46.0)
Hemoglobin: 10.1 g/dL — ABNORMAL LOW (ref 12.0–15.0)
Hemoglobin: 12.3 g/dL (ref 12.0–15.0)
Hemoglobin: 12.4 g/dL (ref 12.0–15.0)
Hemoglobin: 12.4 g/dL (ref 12.0–15.0)
Hemoglobin: 12.5 g/dL (ref 12.0–15.0)
Hemoglobin: 12.7 g/dL (ref 12.0–15.0)
Hemoglobin: 12.8 g/dL (ref 12.0–15.0)
Hemoglobin: 10.7 g/dL — ABNORMAL LOW (ref 12.0–15.0)
Hemoglobin: 8.3 g/dL — ABNORMAL LOW (ref 12.0–15.0)
Hemoglobin: 9.5 g/dL — ABNORMAL LOW (ref 12.0–15.0)
Hemoglobin: 9.6 g/dL — ABNORMAL LOW (ref 12.0–15.0)
MCHC: 32.6 g/dL (ref 30.0–36.0)
MCHC: 33.5 g/dL (ref 30.0–36.0)
MCHC: 33.6 g/dL (ref 30.0–36.0)
MCHC: 33.6 g/dL (ref 30.0–36.0)
MCHC: 33.7 g/dL (ref 30.0–36.0)
MCHC: 34 g/dL (ref 30.0–36.0)
MCHC: 34 g/dL (ref 30.0–36.0)
MCHC: 34.1 g/dL (ref 30.0–36.0)
MCHC: 34.2 g/dL (ref 30.0–36.0)
MCHC: 33.1 g/dL (ref 30.0–36.0)
MCV: 85.8 fL (ref 78.0–100.0)
MCV: 86.4 fL (ref 78.0–100.0)
MCV: 86.7 fL (ref 78.0–100.0)
MCV: 86.8 fL (ref 78.0–100.0)
MCV: 87.2 fL (ref 78.0–100.0)
MCV: 87.4 fL (ref 78.0–100.0)
MCV: 87.6 fL (ref 78.0–100.0)
MCV: 87.6 fL (ref 78.0–100.0)
MCV: 84 fL (ref 78.0–100.0)
MCV: 84 fL (ref 78.0–100.0)
Platelets: 281 10*3/uL (ref 150–400)
Platelets: 318 10*3/uL (ref 150–400)
Platelets: 330 10*3/uL (ref 150–400)
Platelets: 412 10*3/uL — ABNORMAL HIGH (ref 150–400)
Platelets: 413 10*3/uL — ABNORMAL HIGH (ref 150–400)
Platelets: 424 10*3/uL — ABNORMAL HIGH (ref 150–400)
Platelets: 428 10*3/uL — ABNORMAL HIGH (ref 150–400)
Platelets: 492 10*3/uL — ABNORMAL HIGH (ref 150–400)
RBC: 3.41 MIL/uL — ABNORMAL LOW (ref 3.87–5.11)
RBC: 4.11 MIL/uL (ref 3.87–5.11)
RBC: 4.23 MIL/uL (ref 3.87–5.11)
RBC: 4.28 MIL/uL (ref 3.87–5.11)
RBC: 4.29 MIL/uL (ref 3.87–5.11)
RBC: 4.3 MIL/uL (ref 3.87–5.11)
RBC: 3.44 MIL/uL — ABNORMAL LOW (ref 3.87–5.11)
RBC: 3.84 MIL/uL — ABNORMAL LOW (ref 3.87–5.11)
RDW: 15.4 % (ref 11.5–15.5)
RDW: 15.5 % (ref 11.5–15.5)
RDW: 15.6 % — ABNORMAL HIGH (ref 11.5–15.5)
RDW: 15.6 % — ABNORMAL HIGH (ref 11.5–15.5)
RDW: 15.6 % — ABNORMAL HIGH (ref 11.5–15.5)
RDW: 15.3 % (ref 11.5–15.5)
RDW: 15.4 % (ref 11.5–15.5)
WBC: 11.1 10*3/uL — ABNORMAL HIGH (ref 4.0–10.5)
WBC: 7.9 10*3/uL (ref 4.0–10.5)
WBC: 8.2 10*3/uL (ref 4.0–10.5)
WBC: 9.1 10*3/uL (ref 4.0–10.5)
WBC: 18 10*3/uL — ABNORMAL HIGH (ref 4.0–10.5)
WBC: 20.8 10*3/uL — ABNORMAL HIGH (ref 4.0–10.5)
WBC: 8.2 10*3/uL (ref 4.0–10.5)

## 2010-05-05 LAB — TSH: TSH: 18.999 u[IU]/mL — ABNORMAL HIGH (ref 0.350–4.500)

## 2010-05-05 LAB — GLUCOSE, CAPILLARY
Glucose-Capillary: 108 mg/dL — ABNORMAL HIGH (ref 70–99)
Glucose-Capillary: 109 mg/dL — ABNORMAL HIGH (ref 70–99)
Glucose-Capillary: 115 mg/dL — ABNORMAL HIGH (ref 70–99)
Glucose-Capillary: 118 mg/dL — ABNORMAL HIGH (ref 70–99)
Glucose-Capillary: 119 mg/dL — ABNORMAL HIGH (ref 70–99)
Glucose-Capillary: 129 mg/dL — ABNORMAL HIGH (ref 70–99)
Glucose-Capillary: 153 mg/dL — ABNORMAL HIGH (ref 70–99)
Glucose-Capillary: 157 mg/dL — ABNORMAL HIGH (ref 70–99)
Glucose-Capillary: 217 mg/dL — ABNORMAL HIGH (ref 70–99)
Glucose-Capillary: 223 mg/dL — ABNORMAL HIGH (ref 70–99)
Glucose-Capillary: 230 mg/dL — ABNORMAL HIGH (ref 70–99)
Glucose-Capillary: 237 mg/dL — ABNORMAL HIGH (ref 70–99)
Glucose-Capillary: 251 mg/dL — ABNORMAL HIGH (ref 70–99)
Glucose-Capillary: 254 mg/dL — ABNORMAL HIGH (ref 70–99)
Glucose-Capillary: 257 mg/dL — ABNORMAL HIGH (ref 70–99)
Glucose-Capillary: 264 mg/dL — ABNORMAL HIGH (ref 70–99)
Glucose-Capillary: 280 mg/dL — ABNORMAL HIGH (ref 70–99)
Glucose-Capillary: 280 mg/dL — ABNORMAL HIGH (ref 70–99)
Glucose-Capillary: 284 mg/dL — ABNORMAL HIGH (ref 70–99)
Glucose-Capillary: 284 mg/dL — ABNORMAL HIGH (ref 70–99)
Glucose-Capillary: 319 mg/dL — ABNORMAL HIGH (ref 70–99)
Glucose-Capillary: 328 mg/dL — ABNORMAL HIGH (ref 70–99)
Glucose-Capillary: 114 mg/dL — ABNORMAL HIGH (ref 70–99)
Glucose-Capillary: 143 mg/dL — ABNORMAL HIGH (ref 70–99)
Glucose-Capillary: 162 mg/dL — ABNORMAL HIGH (ref 70–99)
Glucose-Capillary: 181 mg/dL — ABNORMAL HIGH (ref 70–99)
Glucose-Capillary: 199 mg/dL — ABNORMAL HIGH (ref 70–99)
Glucose-Capillary: 209 mg/dL — ABNORMAL HIGH (ref 70–99)
Glucose-Capillary: 221 mg/dL — ABNORMAL HIGH (ref 70–99)
Glucose-Capillary: 237 mg/dL — ABNORMAL HIGH (ref 70–99)
Glucose-Capillary: 289 mg/dL — ABNORMAL HIGH (ref 70–99)
Glucose-Capillary: 344 mg/dL — ABNORMAL HIGH (ref 70–99)
Glucose-Capillary: 51 mg/dL — ABNORMAL LOW (ref 70–99)

## 2010-05-05 LAB — PROTIME-INR
INR: 1.2 (ref 0.00–1.49)
INR: 2 — ABNORMAL HIGH (ref 0.00–1.49)
Prothrombin Time: 13.4 seconds (ref 11.6–15.2)

## 2010-05-05 LAB — MAGNESIUM: Magnesium: 1.1 mg/dL — ABNORMAL LOW (ref 1.5–2.5)

## 2010-05-05 LAB — IRON AND TIBC: UIBC: 248 ug/dL

## 2010-05-05 LAB — CROSSMATCH

## 2010-05-05 LAB — RETICULOCYTES
RBC.: 3.45 MIL/uL — ABNORMAL LOW (ref 3.87–5.11)
Retic Count, Absolute: 65.6 10*3/uL (ref 19.0–186.0)

## 2010-05-05 LAB — COMPREHENSIVE METABOLIC PANEL
ALT: 17 U/L (ref 0–35)
AST: 12 U/L (ref 0–37)
Albumin: 2.2 g/dL — ABNORMAL LOW (ref 3.5–5.2)
Alkaline Phosphatase: 148 U/L — ABNORMAL HIGH (ref 39–117)
GFR calc Af Amer: 38 mL/min — ABNORMAL LOW (ref >60)
Potassium: 6.5 mEq/L (ref 3.5–5.1)
Sodium: 128 mEq/L — ABNORMAL LOW (ref 135–145)
Total Protein: 6.6 g/dL (ref 6.0–8.3)

## 2010-05-05 LAB — T4, FREE: Free T4: 0.78 ng/dL — ABNORMAL LOW (ref 0.80–1.80)

## 2010-05-05 LAB — HEMOCCULT GUIAC POC 1CARD (OFFICE): Fecal Occult Bld: NEGATIVE

## 2010-05-05 LAB — CK TOTAL AND CKMB (NOT AT ARMC)
CK, MB: 2.1 ng/mL (ref 0.3–4.0)
Total CK: 23 U/L (ref 7–177)

## 2010-05-05 LAB — POCT I-STAT, CHEM 8
Calcium, Ion: 1.25 mmol/L (ref 1.12–1.32)
Chloride: 101 mEq/L (ref 96–112)
HCT: 36 % (ref 36.0–46.0)
TCO2: 23 mmol/L (ref 0–100)

## 2010-05-05 LAB — FERRITIN: Ferritin: 62 ng/mL (ref 10–291)

## 2010-05-05 LAB — POTASSIUM: Potassium: 5 mEq/L (ref 3.5–5.1)

## 2010-05-05 LAB — URINE MICROSCOPIC-ADD ON

## 2010-05-05 LAB — T3, FREE: T3, Free: 1.5 pg/mL — ABNORMAL LOW (ref 2.3–4.2)

## 2010-05-05 LAB — URINE CULTURE
Colony Count: NO GROWTH
Culture: NO GROWTH

## 2010-05-05 LAB — VITAMIN D 25 HYDROXY (VIT D DEFICIENCY, FRACTURES): Vit D, 25-Hydroxy: 20 ng/mL — ABNORMAL LOW (ref 30–89)

## 2010-05-05 LAB — DIFFERENTIAL
Eosinophils Relative: 4 % (ref 0–5)
Lymphocytes Relative: 4 % — ABNORMAL LOW (ref 12–46)
Lymphs Abs: 0.8 10*3/uL (ref 0.7–4.0)

## 2010-05-05 LAB — HEPARIN LEVEL (UNFRACTIONATED)
Heparin Unfractionated: 0.38 IU/mL (ref 0.30–0.70)
Heparin Unfractionated: 0.46 IU/mL (ref 0.30–0.70)
Heparin Unfractionated: 0.5 IU/mL (ref 0.30–0.70)
Heparin Unfractionated: 0.78 IU/mL — ABNORMAL HIGH (ref 0.30–0.70)
Heparin Unfractionated: 0.81 IU/mL — ABNORMAL HIGH (ref 0.30–0.70)

## 2010-05-05 LAB — POCT CARDIAC MARKERS: CKMB, poc: <1 ng/mL — ABNORMAL LOW (ref 1.0–8.0)

## 2010-05-05 LAB — CARDIAC PANEL(CRET KIN+CKTOT+MB+TROPI): Troponin I: 0.04 ng/mL (ref 0.00–0.06)

## 2010-05-05 LAB — BRAIN NATRIURETIC PEPTIDE
Pro B Natriuretic peptide (BNP): 210 pg/mL — ABNORMAL HIGH (ref 0.0–100.0)
Pro B Natriuretic peptide (BNP): 217 pg/mL — ABNORMAL HIGH (ref 0.0–100.0)
Pro B Natriuretic peptide (BNP): 393 pg/mL — ABNORMAL HIGH (ref 0.0–100.0)

## 2010-05-05 LAB — APTT: aPTT: >200 seconds (ref 24–37)

## 2010-05-05 LAB — PREPARE RBC (CROSSMATCH)

## 2010-05-05 LAB — FOLATE: Folate: 18 ng/mL

## 2010-05-11 LAB — COMPREHENSIVE METABOLIC PANEL
ALT: 13 U/L (ref 0–35)
AST: 14 U/L (ref 0–37)
Albumin: 3.2 g/dL — ABNORMAL LOW (ref 3.5–5.2)
Alkaline Phosphatase: 75 U/L (ref 39–117)
CO2: 26 mEq/L (ref 19–32)
Chloride: 103 mEq/L (ref 96–112)
GFR calc Af Amer: 47 mL/min — ABNORMAL LOW (ref >60)
GFR calc non Af Amer: 39 mL/min — ABNORMAL LOW (ref >60)
Potassium: 4.7 mEq/L (ref 3.5–5.1)
Total Bilirubin: 0.7 mg/dL (ref 0.3–1.2)

## 2010-05-11 LAB — GLUCOSE, CAPILLARY
Glucose-Capillary: 112 mg/dL — ABNORMAL HIGH (ref 70–99)
Glucose-Capillary: 133 mg/dL — ABNORMAL HIGH (ref 70–99)
Glucose-Capillary: 161 mg/dL — ABNORMAL HIGH (ref 70–99)
Glucose-Capillary: 179 mg/dL — ABNORMAL HIGH (ref 70–99)
Glucose-Capillary: 193 mg/dL — ABNORMAL HIGH (ref 70–99)
Glucose-Capillary: 257 mg/dL — ABNORMAL HIGH (ref 70–99)
Glucose-Capillary: 266 mg/dL — ABNORMAL HIGH (ref 70–99)
Glucose-Capillary: 274 mg/dL — ABNORMAL HIGH (ref 70–99)
Glucose-Capillary: 283 mg/dL — ABNORMAL HIGH (ref 70–99)
Glucose-Capillary: 320 mg/dL — ABNORMAL HIGH (ref 70–99)
Glucose-Capillary: 336 mg/dL — ABNORMAL HIGH (ref 70–99)
Glucose-Capillary: 409 mg/dL — ABNORMAL HIGH (ref 70–99)

## 2010-05-11 LAB — URINALYSIS, ROUTINE W REFLEX MICROSCOPIC
Glucose, UA: >1000 mg/dL — AB
Ketones, ur: NEGATIVE mg/dL
Protein, ur: 100 mg/dL — AB
pH: 5.5 (ref 5.0–8.0)

## 2010-05-11 LAB — BASIC METABOLIC PANEL
BUN: 19 mg/dL (ref 6–23)
Calcium: 8.1 mg/dL — ABNORMAL LOW (ref 8.4–10.5)
Calcium: 8.4 mg/dL (ref 8.4–10.5)
Creatinine, Ser: 1.18 mg/dL (ref 0.4–1.2)
Creatinine, Ser: 1.21 mg/dL — ABNORMAL HIGH (ref 0.4–1.2)
GFR calc non Af Amer: 42 mL/min — ABNORMAL LOW (ref >60)
GFR calc non Af Amer: 43 mL/min — ABNORMAL LOW (ref >60)
Glucose, Bld: 131 mg/dL — ABNORMAL HIGH (ref 70–99)
Glucose, Bld: 187 mg/dL — ABNORMAL HIGH (ref 70–99)
Sodium: 139 mEq/L (ref 135–145)

## 2010-05-11 LAB — DIFFERENTIAL
Basophils Absolute: 0.2 10*3/uL — ABNORMAL HIGH (ref 0.0–0.1)
Basophils Relative: 1 % (ref 0–1)
Lymphocytes Relative: 7 % — ABNORMAL LOW (ref 12–46)
Monocytes Absolute: 0.8 10*3/uL (ref 0.1–1.0)
Monocytes Relative: 5 % (ref 3–12)
Neutro Abs: 13.6 10*3/uL — ABNORMAL HIGH (ref 1.7–7.7)
Neutrophils Relative %: 87 % — ABNORMAL HIGH (ref 43–77)

## 2010-05-11 LAB — POCT CARDIAC MARKERS
CKMB, poc: 2.8 ng/mL (ref 1.0–8.0)
Myoglobin, poc: 181 ng/mL (ref 12–200)
Troponin i, poc: <0.05 ng/mL (ref 0.00–0.09)

## 2010-05-11 LAB — URINE MICROSCOPIC-ADD ON

## 2010-05-11 LAB — HEMOGLOBIN A1C: Mean Plasma Glucose: 249 mg/dL

## 2010-05-11 LAB — CBC
Hemoglobin: 11.1 g/dL — ABNORMAL LOW (ref 12.0–15.0)
Hemoglobin: 9.6 g/dL — ABNORMAL LOW (ref 12.0–15.0)
MCHC: 31.9 g/dL (ref 30.0–36.0)
MCHC: 32.3 g/dL (ref 30.0–36.0)
RBC: 4.1 MIL/uL (ref 3.87–5.11)
RDW: 15.2 % (ref 11.5–15.5)
RDW: 15.3 % (ref 11.5–15.5)

## 2010-05-11 LAB — TSH: TSH: 0.743 u[IU]/mL (ref 0.350–4.500)

## 2010-05-11 LAB — CK: Total CK: 52 U/L (ref 7–177)

## 2010-05-11 LAB — LIPID PANEL
Cholesterol: 161 mg/dL (ref 0–200)
LDL Cholesterol: 68 mg/dL (ref 0–99)
Triglycerides: 129 mg/dL (ref <150)

## 2010-05-11 LAB — GLUCOSE, RANDOM: Glucose, Bld: 422 mg/dL — ABNORMAL HIGH (ref 70–99)

## 2010-05-11 LAB — MAGNESIUM: Magnesium: 1.5 mg/dL (ref 1.5–2.5)

## 2010-05-12 LAB — GLUCOSE, CAPILLARY
Glucose-Capillary: 123 mg/dL — ABNORMAL HIGH (ref 70–99)
Glucose-Capillary: 190 mg/dL — ABNORMAL HIGH (ref 70–99)

## 2010-06-09 NOTE — Assessment & Plan Note (Signed)
[facility]   NAME:  CAYLEN, KUWAHARA NO.:  [id number]   MEDICAL RECORD NO.:  [id number]      DATE OF BIRTH:  05-Oct-1919   PHYSICIAN:  Maxwell Caul, M.D.      VISIT DATE:                                   OFFICE VISIT   LOCATION:  [facility].   Mrs. Rudge is a lady we have been following for Wegener's grade 2 right  heel ulceration.  She has been treated with antiseptic soap washes,  white cotton sock as dressing and offloading using a Prafo boot.  She  has been having no excessive drainage, malodor, pain.   PHYSICAL EXAMINATION:  Temperature is 97.6, pulse 70, respirations 16,  blood pressure 127/63.  CBG is 120.  Right heel wound measures 1 x 1 x  0.2.  This has a degree of epithelialization, and the wound has  contracted, as witnessed by the dimensions above.  The base of it  appears to be granulated.   IMPRESSION:  Improved Wegener's grade 2 wound.  I have not altered the  current treatment.  We will continue offloading with a Prafo and  antiseptic soap wash and a topical diabetic sock.  She appears to be  gradually improving in all areas.           ______________________________  Maxwell Caul, M.D.     MGR/MEDQ  D:  11/25/2006  T:  11/27/2006  Job:  [id number]

## 2010-06-09 NOTE — Assessment & Plan Note (Signed)
[facility]   [name], [name]               ACCOUNT NO.:  [id number]   MEDICAL RECORD NO.:  [id number]      DATE OF BIRTH:  05/22/1919   PHYSICIAN:  Maxwell Caul, M.D. VISIT DATE:  01/13/2007                                   OFFICE VISIT   LOCATION:  [facility].   [name] is a lady I have not seen in quite some time.  I believe she  was hospitalized sometime after last visit on October 31 with congestive  heart failure.  She ended up spending a protracted stay in [place]  nursing home.  There she received wound care, and I believe was  discharged on DuoDerm.  We had previously been treating her for  Wegener's grade 2 right heel ulceration which had been doing quite well.   PHYSICAL EXAMINATION:  VITAL SIGNS:  Temperature 97.6, pulse 70,  respirations 16, blood pressure 127/63.  WOUNDS:  The wound I am pleased to see actually looks remarkably well.  There is probably a small open area.  Most of this is epithelialized.  I  have told her I do not think the wound is ready for prime time activity.  However, I think this is close to being resolved.   IMPRESSION:  Wegener's grade 2 diabetic right heel wound.  This was  treated well in the nursing home and is close to resolving, although I  do not think it is completely ready for full activity.  I have advised  her to continue the DuoDerm which can be placed two times a week and to  continue her modified PRAFO boot for the next 2-3 weeks.  I will see her  back then.  She is to bring in her diabetic foot wear, and I think will  be completely resolved by that point.           ______________________________  Maxwell Caul, M.D.     MGR/MEDQ  D:  01/13/2007  T:  01/14/2007  Job:  [id number]

## 2010-06-09 NOTE — Discharge Summary (Signed)
NAMEHONOUR, [name] NO.:  [id number]   MEDICAL RECORD NO.:  [id number]          PATIENT TYPE:  INP   LOCATION:  2041                         FACILITY:  [facility]   PHYSICIAN:  Isidor Holts, M.D.  DATE OF BIRTH:  1919-06-13   DATE OF ADMISSION:  11/27/2006  DATE OF DISCHARGE:  12/07/2006                               DISCHARGE SUMMARY   PAST MEDICAL HISTORY:  Merlene Laughter. Renae Gloss, M.D.   DISCHARGE DIAGNOSES:  1. Acute ischemic colitis.  2. Syncopal episodes, vasovagal.  3. Adnexal masses per abdominal/pelvic CT scan of December 01, 2006.  4. Type 2 diabetes mellitus.  5. Hypothyroidism.  6. Dyslipidemia.  7. Hypertension.  8. Coronary artery disease.  9. Chronic right heel ulcer.   DISCHARGE MEDICATIONS:  1. Synthroid 150 mcg p.o. daily.  2. Norvasc 10 mg p.o. daily.  3. Protonix 40 mg p.o. b.i.d.  4. Lantus insulin 28 units subcutaneously q.12h.  5. Cymbalta 30 mg p.o. daily.  6. Vytorin (10/20) one p.o. daily.   Note:  Toprol-XL has been discontinued secondary to sinus bradycardia.  Aspirin has been held until reevaluated by gastroenterologist.   PROCEDURE:  1. Chest x-ray dated November 27, 2006 showed no focal lung disease.  2. Abdominal/pelvic CT scan dated December 01, 2006:  This showed      abnormal splenic flexure and descending colon demonstrating      circumferential wall thickening and pericolic edema/inflammation.      Differential considerations include infectious/inflammatory colitis      versus ischemia. CT pelvis showed a 4.3 x 3.8-cm left adnexal mass      contiguous with the gonadal vessels and possibly ovarian in origin.      Also a 6.4 x 6.0-cm right adnexal mass associated.  The uterus is      surgically absent. Primary ovarian neoplasm or metastatic would be      considerations.  3. Pelvic ultrasound scan dated December 03, 2006:  This was      nondiagnostic secondary to expansive bowel gas.  4. Upper/lower GI endoscopy dated  November 29, 2006 by Dr. Sabino Gasser.      EGD showed a hiatal hernia; otherwise was unremarkable. Flexible      sigmoidoscopy showed patchy colitis involving the rectum but      certainly diffuse, confluent colitis more proximal, and examination      limited by stopping at approximately 30 cm from the anal verge.      Biopsies were taken. Pathology report showed findings consistent      with ischemic colitis.   CONSULTATIONS:  1. Dr. Sabino Gasser, gastroenterologist.  2. Dr. Renaldo Fiddler, GYN.   ADMISSION HISTORY:  As in H&P note of November 27, 2006 dictated by Dr.  Jetty Duhamel. However, in brief, this is an 77 year old female, with  known history of chronic right heel ulcer [facility], type 2 diabetes mellitus, hypothyroidism,  dyslipidemia, hypertension, coronary artery disease, prior history of  right femoral fracture March 27, 2006 status post closed reduction with  percutaneous pinning, DJD, status post L4-5 decompression,  status post  total abdominal hysterectomy/bilateral salpingo-oophorectomy, chronic  watery diarrhea/irritable bowel syndrome, who presented following a  syncopal episode approximately one week after multiple dental  extractions. EMS reportedly found patient to have significant sinus  bradycardia with heart rate in the 40s and placed a transcutaneous  pacer. The patient was also intubated for airway protection. However, in  the emergency department, she was extubated, and transcutaneous pacer  was removed, as she appeared significantly better in the emergency  department. She had multiple episodes of projectile vomiting associated  with hematemesis. There was also fluctuation of heart rate from the 30s  to 50s. The patient was admitted for further evaluation, investigation  and management.   CLINICAL COURSE:  1. Syncopal episode.  This is deemed vasovagal secondary to volume      depletion and profound sinus bradycardia, against  a background of      beta blocker treatment. Beta blocker was discontinued. EKG      demonstrated a sinus bradycardia. Heart rates stabilized between 46      to 56. There were no recurrences of bradycardia during the rest of      patient's hospitalization, and as of December 06, 2006, heart rate      was normal at 78 per minute.   1. Acute ischemic colitis.  The patient has a known history of chronic      watery diarrhea ascribed to irritable bowel syndrome. She now      presents with vomiting which was initially projectile, associated      with hematemesis. The patient's stools were guaiac positive. GI      consultation was called which was kindly provided by Dr. Sabino Gasser.  Patient was managed with intravenous hydration, bowel rest      and commenced on a combination of Ciprofloxacin, Flagyl and      Questran. On November 29, 2006, she underwent upper GI endoscopy as      well as flexible sigmoidoscopy. For details, refer to procedure      list above. Upper GI endoscopy was unremarkable for acute      pathology, although she had a hiatal hernia. Sigmoidoscopy revealed      patchy colitis. Biopsies subsequently confirmed ischemic colitis.      Over the course of her hospitalization, the patient has done quite      well with complete amelioration of symptomatology.  On December 06, 2006, she was completely asymptomatic. Flagyl and Ciprofloxacin      have been discontinued, after a seven-day course of treatment. Of      note, stool studies for clostridium difficile toxin were negative.   1. Adnexal masses.  The patient's abdominal CT scan/pelvic CT scan on      December 01, 2006 revealed bilateral adnexal masses. For details of      findings, refer to procedure list above. These findings were      suspicious for possible neoplastic disease. CA125 tumor marker was      somewhat elevated at 56.4. GYN consultation was therefore called,      which was kindly provided by Dr. Renaldo Fiddler  who has evaluated the      patient and has recommended patient follow up with him on an      outpatient basis, following discharge - telephone number [phone].   1. Type 2 diabetes mellitus.  This was controlled with combination of      carbohydrate-modified diet,  sliding scale insulin coverage, and      Lantus insulin. The patient remained euglycemic during the course      of her hospitalization.   1. Hypothyroidism.  The patient continues on appropriate replacement      therapy.   1. Hypertension.  This was addressed with Norvasc. As noted above, the      patient's beta blocker was discontinued, secondary to profound      sinus bradycardia at the time of admission.   1. History of coronary artery disease.  The patient has remained      stable from this view point, throughout the course of her      hospitalization.   1. Chronic right heel ulcer.  This was managed by the wound management      care team with local care, during the course of patient's      hospitalization. Per wound management team, the patient's right      heel wound has undergone considerably healing, looks pink and dry      without any open areas. It is recommended that she obtain final      assessment at [facility] under the auspices of Dr.      Tanda Rockers and Dr. Baltazar Najjar following discharge, per prior      scheduled appointment.   1. History of dyslipidemia.  The patient continues on pre-admission      dosage of Vytorin.   DISPOSITION:  The patient has been evaluated by PT/OT during the course  of this hospitalization. She currently resides alone, and the feeling is  that she is somewhat deconditioned at present and will be unable to  return home in her current state of health. It is anticipated that she  will require short-term rehabilitation, possibly in a skilled nursing  facility. She was noted to have bilateral moderate lower extremity edema  during this hospitalization, and has been  recommended TED stockings as  well as p.r.n. elevation of both lower extremities. The patient is  deemed clinically stable for discharge and will likely be discharged on  December 07, 2006 provided no acute problems arise in the interim.   DIET:  Heart healthy/carbohydrate modified.   ACTIVITY:  As tolerated. Otherwise, per PT/OT.   WOUND CARE:  Neosporin and dry dressings daily. Recommended to float  heel to avoid pressure.   FOLLOWUP INSTRUCTIONS:  The patient is recommended to follow up with Dr.  Andi Devon, her primary M.D., within 1 to 2 weeks of discharge.  She is also recommended to follow up with Dr. Sabino Gasser,  gastroenterologist, within one week of discharge and Dr. Casimiro Needle  Robson/Dr. Mills Koller [facility] per prior scheduled  appointment.   SPECIAL INSTRUCTIONS:  The patient is recommended to continue  PT/OT/rehabilitation and needed equipment, including appropriate walker.  She is also recommended to follow up with Dr. Renaldo Fiddler, gynecologist -  telephone number [phone] to evaluate adnexal masses and obtain proper  recommendations.   Note:  Aspirin has been discontinued, until reevaluated by Dr. Sabino Gasser, gastroenterologist or alternatively Dr. Andi Devon.      Isidor Holts, M.D.  Electronically Signed     CO/MEDQ  D:  12/06/2006  T:  12/06/2006  Job:  [id number]   cc:   Merlene Laughter. Renae Gloss, M.D.  Georgiana Spinner, M.D.  Zelphia Cairo, MD

## 2010-06-09 NOTE — Assessment & Plan Note (Signed)
[facility]   NAME:  [name], NOBLE               ACCOUNT NO.:  [id number]   MEDICAL RECORD NO.:  [id number]      DATE OF BIRTH:  1919/07/10   PHYSICIAN:  Maxwell Caul, M.D.      VISIT DATE:                                   OFFICE VISIT   [name] returns today for followup of her Wagner's grade 2 wounds on  her bilateral heels.  We have been applying Iodosorb to the left heel, a  protective dressing and a healing sandal.  The right one is essentially  resolved and I declared it resolved last time.   On examination the right heel is unchanged.  The wound is largely closed  over.  On the left the wound remains small and dry.  It is free of  infection.   IMPRESSION:  Bilateral Wagner's grade 2 wounds.  We will continue the  Iodosorb to the left heel and continue with the healing sandal.  On the  right she is free to go back into her own footwear.  She could use the  heel protector which we talked to her about today.           ______________________________  Maxwell Caul, M.D.     MGR/MEDQ  D:  03/24/2007  T:  03/25/2007  Job:  [id number]

## 2010-06-09 NOTE — Assessment & Plan Note (Signed)
[facility]   NAME:  TERRIANN, [name]               ACCOUNT NO.:  [id number]   MEDICAL RECORD NO.:  [id number]      DATE OF BIRTH:  [birth year]   PHYSICIAN:  Maxwell Caul, M.D. VISIT DATE:  02/03/2007                                   OFFICE VISIT   The next patient is [name] who is a lady we have been following  here for Wagner's grade 2 wounds on her right heel.  I last saw her on  December 19.  We continued her in a PRAFO boot.  Applied DuoDerm,  changed by home health.  I had really thought that things would be  healed at this visit.   Unfortunately, things have not gone well.  She has developed not only a  deterioration of the wound on the right heel but a second pressure area  on the left.  This is equally in the same spot just over the area of  insertion of the Achilles tendon.  There has been no excessive pain,  drainage or fever.   EXAMINATION:  Temperature is 98, pulse 87, respirations 16, blood  pressure 137/67.  Unfortunately, there was a quarter-sized blister on  the left heel.  I opened this, there was a scant amount of fluid.  There  was no evidence of surrounding infection, nothing really needed  culturing.  The area on her right heel which was the area of the  previous wound is also extensively extending in terms of its dimensions.  Both of these would suggest inadequate pressure relief.   IMPRESSION:  Wagner's grade 2 wounds on her heels bilaterally.  I  removed some redundant callus from around both of these wounds.  There  is no evidence of infection here.  I think this is inadequate pressure  relief, I have moved to an antibacterial soap, Bactroban, protective  dressings and bilateral heel sandals.  I have counseled her about  keeping the pressure off these heels when she is in bed, she expressed  understanding.  I will review these again in a week secondary to the  deterioration.           ______________________________  Maxwell Caul, M.D.     MGR/MEDQ  D:  02/03/2007  T:  02/03/2007  Job:  [id number]

## 2010-06-09 NOTE — Assessment & Plan Note (Signed)
[facility]   NAMELURLEEN, [name]               ACCOUNT NO.:  [id number]   MEDICAL RECORD NO.:  [id number]      DATE OF BIRTH:  16-Nov-1919   PHYSICIAN:  Maxwell Caul, M.D. VISIT DATE:  04/21/2007                                   OFFICE VISIT   [name] is here for followup for what were diabetic Wagner's grade 2  ulcers on her bilateral heels.  The one on the right had healed up.  There is still a tiny area on the left heel which is well on its way to  resolving.  She has been using her own diabetic footwear and applying  topical Iodosorb.   On examination, temperature is 97.9.  The left heel wound is very small  and dry.  It is free of infection or drainage.   IMPRESSION:  Bilateral Wagner's grade 2 wounds.  I think these are well  on their way to resolving completely.  She has already been fitted for  custom inserts and extra-depth shoes.  We supplied her with an Allevyn  heel to further protect the left heel, but I think she can be safely  discharged at this point to her usual activity.           ______________________________  Maxwell Caul, M.D.     MGR/MEDQ  D:  04/21/2007  T:  04/22/2007  Job:  [id number]

## 2010-06-09 NOTE — Assessment & Plan Note (Signed)
[facility]   NAME:  ATAYA, MURDY               ACCOUNT NO.:  [id number]   MEDICAL RECORD NO.:  [id number]      DATE OF BIRTH:  [birth year]   PHYSICIAN:  Maxwell Caul, M.D. VISIT DATE:  07/29/2006                                   OFFICE VISIT   PURPOSE OF TODAY'S VISIT:  Ms. Lasser is kindly referred from the [facility].   Ms. Tindol tells me that she was hospitalized for surgery on her right  shoulder in March of this year.  During this hospitalization, she  developed a blister on her right heel, and ultimately this has led into  an ulceration.  She has also developed a blister from friction of her  foot wear over her left first metatarsal head.  These are occasionally  painful.  There are not draining.  She states she is applying a cream to  this area.  The note from [facility] on June 10 states that the she was  put on Augmentin and Levaquin for the possibility of cellulitis in the  right heel area.  She has since been applying a cream, the nature of  which I am uncertain.  She has been soaking her foot in Epsom salt.  She  apparently has diabetic foot wear at home.   She also tells me she has been to see vascular surgery although I do not  have any these notes.  She was told that the blood flow was not 100%,  but she has some.   PAST MEDICAL HISTORY:  1. She has been a diabetic for 34 years.  2. Hypertension.  3. Hypercholesterolemia.  4. History of urinary retention probably secondary to a diabetic      neuropathic bladder.  5. History of diabetic peripheral neuropathy.  6. History of gastroesophageal reflux disease.  7. History of hypothyroidism.   CURRENT MEDICATIONS:  1. Aspirin 81 daily.  2. Protonix 40 daily.  3. Senokot-S 1 p.o. every night.  4. Norvasc 5 daily.  5. Vytorin 10/20 one p.o. at bedtime.  6. Cymbalta 30 daily.  7. Toprol XL 100 daily.  8. Synthroid 115 mcg daily.  9. Lantus insulin 25 units  subcutaneous q.12h.  10.Flomax 0.4 daily.  11.Vicodin 5/500 one p.o. q.4h. p.r.n.  12.Ambien 5 p.o. every night p.r.n.   PHYSICAL EXAMINATION:  NEUROLOGIC:  The patient does not have reflexes.  She has absent light touch not only in her distal lower extremities but  in her hands.  She is diffusely areflexic compatible with diabetic  peripheral neuropathy.  CIRCULATION:  Her peripheral pulses were difficult to feel below the  femorals.  Her capillary refill time was probably prolonged on the left  greater than the right.   WOUND EXAM:  There were actually three areas that we looked at.  The  first was on her right first metatarsal head.  This measures 0.5 x 0.3 x  0.1.  This underwent a full-thickness debridement with EMLA for  anesthesia using a #15 blade.  Hemostasis was with direct pressure and a  silver nitrate pencil.  The wound bed looked clean, and there was no  evidence of cellulitis.  Over the right heel area was the original  ulcer.  She complained about measuring 0.5 x 1 x 0.2.  This had some  tissue overhang that underwent a full-thickness debridement, again with  EMLA for anesthesia.  We used a #15 blade.  Hemostasis was with direct  pressure.  The base of this looked like a clean wound without evidence  of cellulitis.  She also had a callus on the tip of her left second toe  which is a hammer deformity.  I did a paring of this callus with a #15  blade.   WOUND CARE PLAN AND FOLLOW-UP:  1. Diabetic Wegener's grade 3, two wounds which are mostly due to      pressure and inadequate foot wear.  She underwent debridements      noted above.  All of these ulcers appear clean and noninfected.  We      applied __________  and hydrogel and put her in a healing sandal.      I counseled the need to keep pressure off these areas.  There is no      need to change her dressings until we see her again next week.  I      think if she has diabetic foot wear she can certainly use it on  the      left foot which is really without wounds.  2. Probably diabetic peripheral vascular disease and diabetic      neuropathy.  The neuropathy here is very significant.  I believe      she had a vascular evaluation, and we will solicit these records,      specifically her ABIs.   We will see her again in 1 weeks' time.           ______________________________  Maxwell Caul, M.D.     MGR/MEDQ  D:  08/03/2006  T:  08/04/2006  Job:  [id number]

## 2010-06-09 NOTE — Discharge Summary (Signed)
NAMEGENOVA, KINER NO.:  [id number]   MEDICAL RECORD NO.:  [id number]          PATIENT TYPE:  INP   LOCATION:  2013                         FACILITY:  [facility]   PHYSICIAN:  Nicki Guadalajara, M.D.     DATE OF BIRTH:  1919-10-28   DATE OF ADMISSION:  06/04/2008  DATE OF DISCHARGE:  06/19/2008                               DISCHARGE SUMMARY   ADDENDUM   Ms. Kienast' TSH the day before discharge was 18.  We will have that  rechecked next week.  Her TSH on admission was 18.      Abelino Derrick, P.A.    ______________________________  Nicki Guadalajara, M.D.    Lenard Lance  D:  06/19/2008  T:  06/20/2008  Job:  [id number]

## 2010-06-09 NOTE — Assessment & Plan Note (Signed)
[facility]   NAMELOUCILE, [name]               ACCOUNT NO.:  [id number]   MEDICAL RECORD NO.:  [id number]      DATE OF BIRTH:  September 05, 1919   PHYSICIAN:  Theresia Majors. Tanda Rockers, M.D. VISIT DATE:  04/07/2007                                   OFFICE VISIT   SUBJECTIVE:  [name] is an 77 year old lady, who we have followed for  Wagner-2 heel ulcers.  In the interim, we have treated her with [place]-  [place] PRAFO equivalent boots as well as healing sandals and topical  Iodosorb.  There has been no recurrent drainage, there has been no  fever, she continues to be ambulatory.   OBJECTIVE:  VITAL SIGNS:  Blood pressure is 145/65, respirations 16,  pulse rate is 77, temperature is 97.7, capillary blood glucose is 124  mg%.   Inspection of the left and right heel shows minimum callus accumulation,  there is no ulceration, no drainage, no evidence of ascending infection,  both feet are warm, but they are not feverish, capillary refill is  moderate.   ASSESSMENT:  Resolved Wagner-2 diabetic foot ulcers.   PLAN:  We have given the patient a prescription for orthotic fitting for  custom inserts and extra-depth shoes.  We will reevaluate her in two  weeks.  We have instructed her in the interim to continue to practice  routine diabetic foot care.  We have given both the patient and her  attendant an opportunity to ask questions, they seem to understand and  indicate that they will be compliant.      Harold A. Tanda Rockers, M.D.  Electronically Signed     HAN/MEDQ  D:  04/07/2007  T:  04/07/2007  Job:  [id number]

## 2010-06-09 NOTE — Op Note (Signed)
[name], KLEBBA NO.:  [id number]   MEDICAL RECORD NO.:  [id number]          PATIENT TYPE:  AMB   LOCATION:  ENDO                         FACILITY:  [facility]   PHYSICIAN:  Georgiana Spinner, M.D.    DATE OF BIRTH:  07-29-1919   DATE OF PROCEDURE:  09/05/2007  DATE OF DISCHARGE:                               OPERATIVE REPORT   PROCEDURE:  Colonoscopy with biopsy.   INDICATIONS:  Colon polyp, colitis previously.   ANESTHESIA:  Fentanyl 100 mcg, Versed 8 mg.   PROCEDURE:  With the patient mildly sedated in the left lateral  decubitus position, the Pentax videoscopic pediatric colonoscope was  inserted in the rectum, passed under direct vision to the cecum,  identified by ileocecal valve at the base of cecum.  There was a  polypoid area noted in the cecum which did not look exactly like a  polyp.  It could have been an inverted diverticulum.  I was not certain,  so I elected only to cold biopsy the mucosa.  Subsequently then, the  colonoscope was then slowly withdrawn taking circumferential views of  the remaining colonic mucosa, suctioning fecal debris as we went, so the  prep was slightly suboptimal, but no gross lesions were seen other than  diverticula as we withdrew the colonoscope all the way to the rectum  which appeared normal on direct and showed hemorrhoids on retroflexed  view.  The endoscope was straightened and withdrawn.  The patient's  vital signs and pulse oximeter remained stable.  The patient tolerated  the procedure well and without apparent complications.   FINDINGS:  Diverticulosis of the sigmoid colon.  Internal hemorrhoids.  Polypoid area in the cecum, etiology of which is unclear.  Await biopsy  report.  The patient call me for results and follow-up with me as needed  as an outpatient.           ______________________________  Georgiana Spinner, M.D.     GMO/MEDQ  D:  09/05/2007  T:  09/05/2007  Job:  [id number]

## 2010-06-09 NOTE — Assessment & Plan Note (Signed)
[facility]   [name]               ACCOUNT NO.:  [id number]   MEDICAL RECORD NO.:  [id number]      DATE OF BIRTH:  Dec 16, 1919   PHYSICIAN:  Theresia Majors. Tanda Rockers, M.D. VISIT DATE:  11/16/2006                                   OFFICE VISIT   SUBJECTIVE:  [name] is an 77 year old lady who we have followed for a  Wagner 2 right heel ulceration.  We have treated her with antiseptic  soap washes, white cotton socks as dressings, and offloading using a  PRAFO boot.  There has been no excessive drainage, malodor, pain or  fever.   OBJECTIVE:  Blood pressure is 114/67, respirations 18, pulse rate 64,  temperature 97.1.  Capillary blood glucose is 140 mg%.  Inspection of  the right heel shows that there is contraction of the wound with scant  exudate.  No evidence of infection.  Capillary refill is brisk.  There  is no evidence of ischemia.   ASSESSMENT:  Continued improvement.   PLAN:  We will continue antiseptic soap washes, elevation, PRAFO boot  offloading protection, and a white sock as a dressing.  We will  reevaluate the patient in 2 weeks p.r.n.      Harold A. Tanda Rockers, M.D.  Electronically Signed     HAN/MEDQ  D:  11/16/2006  T:  11/17/2006  Job:  [id number]

## 2010-06-09 NOTE — Op Note (Signed)
NAMEAMARYAH, MALLEN NO.:  [id number]   MEDICAL RECORD NO.:  [id number]          PATIENT TYPE:  INP   LOCATION:  2917                         FACILITY:  [facility]   PHYSICIAN:  Georgiana Spinner, M.D.    DATE OF BIRTH:  03-Jan-1920   DATE OF PROCEDURE:  DATE OF DISCHARGE:                               OPERATIVE REPORT   PROCEDURE:  Flexible sigmoidoscopy with biopsy.   INDICATIONS:  Rectal bleeding.   ANESTHESIA:  None further given.   PROCEDURE:  With the patient mildly sedated in the left lateral  decubitus position, the Pentax videoscopic colonoscope was inserted into  the rectum.  It was noted as we did that that there was erythema changes  patchily distributed through the rectum.  When we were able to advance  the endoscope to approximately 30 cm from anal verge, it was clear that  the patient had a rather diffuse colitis and I elected to advance no  further feeling that this was probably ischemic colitis.  Therefore I  took photographs and took biopsies of the changes.  The endoscope was  then withdrawn taking circumferential views of the colonic mucosa  stopping only then in the rectum and not placing the endoscope in  retroflex view.  The patient's vital signs and pulse oximeter remained  stable.  The patient tolerated the procedure well without apparent  complications.   FINDINGS:  Patchy colitis involving the rectum, but certainly a diffuse  confluent colitis more proximal and exam limited by my stopping at  approximately 30 cm from anal verge.   PLAN:  Await biopsy report.  The patient will be allowed to resume  previous orders.  Diet will be limited to low-residue and will follow  clinically.           ______________________________  Georgiana Spinner, M.D.     GMO/MEDQ  D:  11/29/2006  T:  11/30/2006  Job:  [id number]

## 2010-06-09 NOTE — Discharge Summary (Signed)
[name], [name]               ACCOUNT NO.:  [id number]   MEDICAL RECORD NO.:  [id number]          PATIENT TYPE:  INP   LOCATION:  3730                         FACILITY:  [facility]   PHYSICIAN:  Richarda Overlie, MD       DATE OF BIRTH:  [birth year]   DATE OF ADMISSION:  07/11/2008  DATE OF DISCHARGE:  07/15/2008                               DISCHARGE SUMMARY   DISCHARGE DIAGNOSES:  1. Atrial flutter, anticoagulation discontinued due to recurrent      falls.  2. Urinary tract infection.  3. Hypothyroidism with elevated TSH.   SECONDARY DIAGNOSES:  1. Type 2 insulin dependent diabetes.  2. Hypertension.  3. Anemia.  4. Noncompliance with medications in the past.  5. History of falls and unsteady gait.  6. Hypothyroidism.  7. Chronic renal insufficiency.   STUDIES:  X-ray of the pelvis shows chronic degenerative changes from  fracture.   PERTINENT LABS:  Hyperkalemia of 6.2 on admission and a creatinine of  1.6.   TSH of 54.46.   SUBJECTIVE:  This is an 77 year old female currently residing in [facility] for rehab who presented to the ER after a fall.  The patient  sustained a bruise on her lower back.  X-rays ruled out a fracture.  Her  electrocardiogram initially showed no acute changes.  Her potassium was  elevated at 6.2.  She was treated with Kayexalate in the ER.  She did  not have any symptoms of CVA or gross sensory neurological deficits.  The patient denied any syncope, presyncope, lightheadedness, chest pain,  palpitations prior to her fall. She was ruled out for ACS. Because of  the patient's recurrent falls, the patient's Coumadin was discontinued.  The patient was also found to have a urinary tract infection  and a  slightly elevated TSH.  Her dose of Synthroid is being adjusted and  being increased to 200.  She would need a repeat TSH in 6 weeks.  The  patient has a history of atrial flutter with ambulation and was found to  be in normal sinus rhythm during  her admission.  The patient was also  found to be mildly hyponatremic on her general chemistry which was  thought to be secondary to her hypothyroidism.  The patient will be  discharged back to [facility] today since the patient symptomatically  feels much better.   FOLLOW-UP INSTRUCTIONS:  TSH in 6 weeks, BMET in 1 week.   DISCHARGE MEDICATIONS:  1. Levaquin 500 mg p.o. daily x5 days.  2. Regular insulin sliding scale, 200-250 two units, 250-300 four      units, 300-350 six units, 350-400 eight units, 400-450 ten units,      more then 450 twelve units.  3. Synthroid 200 mcg p.o. daily.  4. Cymbalta 60 mg p.o. nightly.  5. Protonix 40 mg p.o. daily.  6. Lasix 20 mg p.o. daily.  7. Questran 4 grams p.o. b.i.d.  8. Neurontin 300 mg p.o. nightly.  9. Flomax 0.4 mg p.o. daily.  10.Lantus 40 units subcu at bedtime.  11.Multivitamin 1 tablet p.o. daily.  12.Diltiazem 120 mg p.o. daily.  13.Pyridium 200 mg p.o. t.i.d. x2 days.  14.Aspirin 325 p.o. daily.      Richarda Overlie, MD  Electronically Signed     NA/MEDQ  D:  07/15/2008  T:  07/15/2008  Job:  [id number]

## 2010-06-09 NOTE — Consult Note (Signed)
[name], SAN NO.:  [id number]   MEDICAL RECORD NO.:  [id number]          PATIENT TYPE:  INP   LOCATION:  2013                         FACILITY:  [facility]   PHYSICIAN:  Hillis Range, MD       DATE OF BIRTH:  1919/06/20   DATE OF CONSULTATION:  DATE OF DISCHARGE:                                 CONSULTATION   REQUESTING PHYSICIAN:  Jay R. Jacinto Halim, MD   REASON FOR CONSULTATION:  Management of atrial flutter.   HISTORY OF PRESENT ILLNESS:  [name] is a pleasant 77 year old female  with a history of coronary artery disease, diabetes, and hypertension  who was admitted with symptomatic typical appearing atrial flutter.  She  presented on Jun 04, 2008, with symptoms of left arm pain, chest  discomfort and was found to have atrial flutter with rapid ventricular  rates.  She has also had episodes of dizziness and lightheadedness  without syncope.  She has been treated with rate control and initiated  on heparin.  A transesophageal echocardiogram has been performed which  has revealed no left atrial thrombus.  She is planned for cardioversion  later today.  The patient has also been initiated on amiodarone, but  continues to have atrial flutter. The patient has a history of falls and  also difficulties with noncompliance.  She also had a drop in her  hematocrit during this hospitalization for which she required  transfusion.  Though, she is felt to be able to tolerate short term  Coumadin.  She is felt not to be a candidate for Coumadin longterm.  Presently, she is resting comfortably and without complaint.  She denies  chest pain, shortness of breath, orthopnea, PND, palpitations,  presyncope, or syncope.   PAST MEDICAL HISTORY:  1. Coronary artery disease, though we do not have documentation of her      coronary anatomy.  2. Diabetes mellitus.  3. Hypertension.  4. Hypothyroidism.  5. Recurrent falls.  6. Preserved ejection fraction.  7. Meniere disease.  8. Typical appearing atrial flutter.  9. There is no documented atrial fibrillation.   ALLERGIES:  MORPHINE, EPINEPHRINE, and CONTRAST.   MEDICATIONS:  1. Neurontin 300 mg at bedtime.  2. Trazodone 25 mg at bedtime.  3. Lantus 40 units at bedtime.  4. Heparin drip.  5. Multivitamin daily.  6. Cymbalta 60 mg daily.  7. Protonix 40 mg daily.  8. Aspirin 325 mg daily.  9. Levothyroxine 125 mcg daily.  10.Amiodarone 200 mg b.i.d.  11.Digoxin 0.0625 mg daily.  12.Ferrous sulfate 325 mg daily.  13.MiraLax.  14.Colace.  15.Diltiazem 30 mg q.6 h.  16.Flomax.  17.Cholestyramine.   SOCIAL HISTORY:  The patient lives alone in [place] and is retired.  She has a daughter who is involved in her care.  She denies tobacco,  alcohol, or drug use.   FAMILY HISTORY:  Notable for coronary artery disease.   REVIEW OF SYSTEMS:  All systems are reviewed and negative except as  outlined in the HPI above.   PHYSICAL EXAMINATION:  Telemetry reveals atrial flutter with variable  ventricular rates  between 4 to 1 and 2 to 1 and conduction.  VITAL SIGNS:  Blood pressure 132/54, heart rate 84, respirations 20, and  sats 94% on 2 L, afebrile.  GENERAL:  The patient is a thin, elderly female in no acute distress.  She is alert and oriented x3.  HEENT:  Normocephalic and atraumatic.  Sclerae clear.  Conjunctivae  pink.  Oropharynx clear.  NECK:  Supple.  JVP 10 cm.  LUNGS:  Few bibasilar rales, otherwise clear.  HEART:  Irregularly regular rhythm.  No murmurs, rubs, or gallops.  GI:  Soft, nontender, and nondistended.  Positive bowel sounds.  EXTREMITIES:  Trace lower extremity bilaterally.  NEUROLOGIC:  Cranial nerves II through XII are intact.  Strength and  sensation are intact.  SKIN:  No ecchymosis or lacerations.  MUSCULOSKELETAL:  No deformity or diffuse muscle atrophy is noted.   EKG reveals typical appearing atrial flutter with nonspecific ST-T wave  changes.   LABORATORY DATA:   Sodium 132, potassium 4.5, BUN 12, creatinine 1.8, INR  1.1, hematocrit 36, platelet count 356, digoxin 0.9, and free T4 0.78.   A transesophageal echocardiogram from Jun 12, 2008, is reviewed.   A chest x-ray from Jun 13, 2008, is reviewed shows mild interstitial  prominence likely due to edema.   IMPRESSION:  [name] is a pleasant 77 year old female with multiple  comorbidities as outlined above including coronary artery disease with a  preserved ejection fraction, hypertension, diabetes, and symptomatic  typical appearing atrial flutter.  Her CHADS2 score is at least 3 and  therefore she should be chronically anticoagulated with Coumadin for  stroke prevention.  Unfortunately due to frequent falls and a prior drop  in her hematocrit, she may not be a candidate for Coumadin longterm.  I  agree that the short term cardioversion may be helpful to treat her  symptoms related to rapid ventricular rate associated with her atrial  flutter.  I think that the longterm catheter ablation for atrial flutter  would be advantageous as this would reduce her required length of  Coumadin to 30 days.  Risks, benefits, and alternatives, to EP study and  radiofrequency ablation for atrial flutter including but not limited to  stroke, bleeding and vascular damage, pericardial effusion with  tamponade and damage to the normal conduction  system requiring a pacemaker were discussed.  The patient wishes to  further contemplate this strategy  if the patent decides to proceed than  we would plan to proceed with catheter ablation at the next available  time, which will likely be on Monday.      Hillis Range, MD  Electronically Signed     JA/MEDQ  D:  06/14/2008  T:  06/15/2008  Job:  [id number]   cc:   Cristy Hilts. Jacinto Halim, MD

## 2010-06-09 NOTE — Discharge Summary (Signed)
NAMELOREE, SHEHATA NO.:  [id number]   MEDICAL RECORD NO.:  [id number]          PATIENT TYPE:  INP   LOCATION:  2013                         FACILITY:  [facility]   PHYSICIAN:  Nicki Guadalajara, M.D.     DATE OF BIRTH:  Aug 24, 75 1921   DATE OF ADMISSION:  06/04/2008  DATE OF DISCHARGE:                               DISCHARGE SUMMARY   DISCHARGE DIAGNOSES:  1. Atrial flutter, direct current cardioversion this admission with      eventual atrial flutter ablation by Dr. Johney Frame on Jun 17, 2008.  2. Coumadin therapy for 30 days and then discontinue.  3. Past history of coronary disease without details.  4. Type 2 insulin-dependent diabetes.  5. Treated hypertension.  6. Anemia, transfused this admission.  7. Noncompliance with medications in the past.  8. History of falls and unsteady gait.  9. Treated hypothyroidism, TSH was 18 when she was admitted, we      suspect this is a medicine noncompliance.  10.Renal insufficiency.  11.Preserved left ventricular function.   HOSPITAL COURSE:  The patient is an 77 year old female with a history of  coronary disease, diabetes and hypertension.  She was admitted with  atrial flutter.  She has had admissions in the past for falls and  actually has had fractures in the past.  She is not felt to be Coumadin  candidate.  We are not sure she takes her medicines at home.  She was  supposed to be on Synthroid but her TSH was 18 on admission.  She was  admitted to telemetry, started on IV heparin for atrial flutter.  TEE  had been done reportedly on the 11th that indicated a possible left  atrial thrombus.  The patient was treated with rate control.  We did not  think she was a long-term Coumadin candidate.  Creatinine was 1.9.  There was decided to proceed with a TEE cardioversion and continue  amiodarone in hopes that she would maintain sinus rhythm.  Unfortunately  TEE was not able to be done because of scheduling issues with  anesthesia.   We asked Dr. Johney Frame to see her in consult for possible  atrial flutter ablation which would require less time on Coumadin after  her conversion.  On Jun 14, 2008 the patient underwent DC cardioversion  by Dr. Tresa Endo.  A TEE done on Jun 12, 2008, did not show an LA thrombus  and showed good LV function.  On Jun 17, 2008, the patient underwent  successful atrial flutter ablation.  Plan is for Coumadin for 4 weeks  and then discontinue.  There had been talk of short-term nursing home  placement and will encourage this at discharge.  We feel the patient can  be discharged Jun 19, 2008.   DISCHARGE MEDICATIONS:  1. Synthroid 0.125 mg a day.  2. Cymbalta 60 mg q.h.s.  3. Protonix 40 mg a day.  4. Lasix 20 mg a day.  5. Norvasc has been discontinued.  6. Cholestyramine 2 scoops b.i.d.  7. Neurontin 300 mg h.s.  8. Flomax 0.4 mg a day.  9. Trazodone  1/2 tablet h.s.  10.Lantus 40 units h.s.  11.Lanoxin 0.0625 mg a day.  12.Diltiazem SR 120 mg a day.  13.Coumadin 5 mg a day or as directed.  14.Fosamax every week.  15.Multivitamins.   LABORATORY DATA:  White count 8.2, hemoglobin 12.7, hematocrit 38.9,  platelets 277.  INR is 2.0.  Sodium 128, potassium 4.8, BUN 18,  creatinine 1.8.  TSH on the 25th was 89.6.  Her amiodarone has been  discontinued.  Iron level was 34 with a percent saturation of 12, iron  was added at discharge, free T4 was 0.78, free T3 was 1.5, cholesterol  119, HDL 30, LDL 65.  CK-MB and troponins were negative.  EKG shows  sinus rhythm at 60.   DISPOSITION:  The patient is discharged in stable condition.  She will  need a pro time weekly for 4 weeks and then will discontinue her  Coumadin.  Her INR goal will be 2 to 3.  She is to follow up with Dr.  Johney Frame Thursday July 1,2010 at 9:45.  Dr. Landry Dyke office will contact  her for follow-up.  [facility] will also contact her for follow-  up pro time on Friday.      Abelino Derrick, P.A.     ______________________________  Nicki Guadalajara, M.D.    Lenard Lance  D:  06/19/2008  T:  06/19/2008  Job:  [id number]   cc:   Merlene Laughter. Renae Gloss, M.D.  Hillis Range, MD

## 2010-06-09 NOTE — Assessment & Plan Note (Signed)
[facility]   NAME:  [name], [name]               ACCOUNT NO.:  [id number]   MEDICAL RECORD NO.:  [id number]      DATE OF BIRTH:  Oct 02, 1919   PHYSICIAN:  Maxwell Caul, M.D. VISIT DATE:  03/10/2007                                   OFFICE VISIT   [name] returns today for her bilateral Wegoner's grade 2 wounds on  her bilateral heels.  She has been using antibacterial soap, topical  antibiotics, and a protective dressing.  She has continued in healing  sandals.   WOUND EXAM:  She is afebrile.  The right heel wound I have actually  declared resolved at this point. I think this is fully epithelialized.  I do not think the skin in this area is vibrant; however, right now I  think this has healed over.  On the left heel, there continues to be a  dry wound.  I did debride this. I did remove some eschar from this.  Underneath the granulation looks healthy.  There is epithelialization in  the rim around the lower part of the circumference of this wound.   IMPRESSION:  Continued bilateral live Wagoner's grade 2 wounds.  I have  declared the right one resolved.  The left heel, I have returned her to  Iodosorb, a protective dressing and the healing sandal.  All she needs  to do with the right heel is protect this area with Allevyn or similar  substance.           ______________________________  Maxwell Caul, M.D.     MGR/MEDQ  D:  03/10/2007  T:  03/12/2007  Job:  [id number]

## 2010-06-09 NOTE — Assessment & Plan Note (Signed)
[facility]   NAMEMARIANNA, [name]               ACCOUNT NO.:  [id number]   MEDICAL RECORD NO.:  [id number]      DATE OF BIRTH:  02-16-1919   PHYSICIAN:  Theresia Majors. Tanda Rockers, M.D. VISIT DATE:  08/10/2006                                   OFFICE VISIT   SUBJECTIVE:  Ms. Hook is an 77 year old female who was last seen in the  wound center on August 03, 2006 for diabetic foot ulcers.  In the interim  she was treated with Prisma hydrogel and healing sandals.  She injured  the anterior shin of the left lower extremity 3 days ago and  administered first aid at home.  Was not seen by a physician until her  visit to the wound center today.  There is been no interim fever,  malodor, or drainage.   OBJECTIVE:  Blood pressure is 109/49, respirations 18, pulse rate 67,  temperature 98.4.  She is accompanied by her daughter.  Capillary blood  glucoses 107 mg percent.  Inspection of the lower extremity shows bilateral 2+ edema.  Wound #1 at  the right first met head is clean with a minimum moist exudate.  No  debridement is needed.  Wound #2 on the right heel has a similar minor  exudate.  There is no evidence of active infection.  Wound #3 is the new  wound post-traumatic on the left anterior shin associated with 2+ edema  and a jagged laceration.  There is a moist serous drainage but no  evidence of ascending infection or abscess formation.  There is no  associated hematoma.  The capillary refill is normal.  Pedal pulses are  indeterminate in part due to the edema.   ASSESSMENT:  Clinical improvement of wounds #1 and #2, and #3 is a post-  traumatic stasis ulcer involving the left anterior shin.   PLAN:  We will change the dressing to incorporate Iodosorb gel over  wounds number 1 and 2.  We will continue the offloading healing sandal  for wounds 1 and 2.  For wound #3 we will place her in a Profore with a  Silverlon swath we have instructed the patient to contact her  primary  care physician to check the status of her tetanus booster and she may  need a tetanus shot if she has no documentation within the last 10  years.  We have given the patient and her daughter an opportunity to ask  questions.  They seem to understand the foregoing discussion and  indicate that they will be compliant.  We will reevaluate her in 1 week.      Harold A. Tanda Rockers, M.D.  Electronically Signed     HAN/MEDQ  D:  08/10/2006  T:  08/11/2006  Job:  [id number]   cc:   Merlene Laughter. Renae Gloss, M.D.

## 2010-06-09 NOTE — Op Note (Signed)
NAMEMIKYA, DON NO.:  [id number]   MEDICAL RECORD NO.:  [id number]          PATIENT TYPE:  INP   LOCATION:  2013                         FACILITY:  [facility]   PHYSICIAN:  Nicki Guadalajara, M.D.     DATE OF BIRTH:  21-Sep-1919   DATE OF PROCEDURE:  DATE OF DISCHARGE:                               OPERATIVE REPORT   INDICATIONS:  Ms. Tammy Wickliffe is an 77 year old female who has  developed recurrent episodes of atrial flutter.  She underwent TE  evaluation by Dr. Jacinto Halim, which showed enlarged left atrium with normal  LV function.  There was no evidence for any LA clot.  She has been  started on Coumadin.  She was seen by the EP Service and is scheduled  for an ablation next Monday.  She is now undergoing DC cardioversion in  attempt to restore sinus rhythm.   EKG confirms atrial flutter with variable AV block.  The patient's  heparin level was therapeutic at 0.71.  Anesthesia was provided by Dr.  Randa Evens.  The patient received 65 mg of propofol for sedation.  Anterior  and posterior pads were placed and the patient successfully converted to  normal sinus rhythm with biphasic synchronized DC cardioversion at 100  joules.  ECG confirmed normal sinus rhythm.  She tolerated the procedure  well.   IMPRESSION:  Successful DC cardioversion utilizing biphasic synchronized  countershock at 100 joules with restoration of normal sinus rhythm from  atrial flutter.           ______________________________  Nicki Guadalajara, M.D.     TK/MEDQ  D:  06/14/2008  T:  06/15/2008  Job:  [id number]   cc:   Sheliah Mends, MD  Cristy Hilts. Jacinto Halim, MD  Merlene Laughter Renae Gloss, M.D.

## 2010-06-09 NOTE — Op Note (Signed)
NAMEKINLIE, JANICE NO.:  [id number]   MEDICAL RECORD NO.:  [id number]          PATIENT TYPE:  INP   LOCATION:  2013                         FACILITY:  [facility]   PHYSICIAN:  Hillis Range, MD       DATE OF BIRTH:  [birth year]   DATE OF PROCEDURE:  DATE OF DISCHARGE:                               OPERATIVE REPORT   PREPROCEDURE DIAGNOSIS:  Typical-appearing isthmus-dependent right  atrial flutter.   POSTPROCEDURE DIAGNOSES:  Typical-appearing isthmus-dependent right  atrial flutter.   PROCEDURES:  1. Comprehensive EP study.  2. Coronary sinus pacing and recording.  3. Arrhythmia induction with pacing.  4. Mapping along the cavotricuspid isthmus.  5. Ablation of supraventricular tachycardia.   INTRODUCTION:  Ms. Fiallo is a pleasant 77 year old female who was  admitted with symptomatic atrial flutter with rapid ventricular rates.  She had an EKG performed, which was consistent with counterclockwise  isthmus-dependent right atrial flutter.  She underwent cardioversion to  sinus rhythm and was initiated on amiodarone.  Due to concerns for long-  term Coumadin administration and frequent falls, she now presents for EP  study and radiofrequency ablation of her atrial flutter.  She has no  prior documented atrial fibrillation.   DESCRIPTION OF PROCEDURE:  Informed written consent was obtained and the  patient was brought to the electrophysiology lab in a fasting state.  She was adequately sedated with intravenous Versed, Valium, and fentanyl  as outlined in the nursing report.  The patient's right groin was  prepped and draped in the usual sterile fashion by the EP lab staff.  Using a percutaneous Seldinger technique, two 6-French and one 8-French  hemostasis sheaths were placed into the right common femoral vein.  A 6-  French decapolar Polaris X catheter was introduced through the right  common femoral vein and advanced into the coronary sinus for recording  and  pacing from this location.  A 6-French quadripolar Josephson  catheter was introduced through the right common femoral vein and  advanced into the right ventricle for recording and pacing.  This  catheter was then pulled back to the His bundle location.  The patient  presented to the Electrophysiology Lab in normal sinus rhythm.  Her RR  interval measured 979 milliseconds with a PR interval of 205  milliseconds, QRS duration 98 milliseconds, and QT interval of 424  milliseconds.  Her AH interval measured 137 milliseconds with an HV  interval of 48 milliseconds.  Ventricular pacing was performed, which  revealed VA dissociation at baseline.  Atrial pacing was performed,  which revealed no evidence of PR greater than RR with an AV Wenckebach  cycle length of 470 milliseconds.  No sustained arrhythmias could be  induced with rapid atrial pacing down to the cycle length of 200  milliseconds.  This was felt to be as secondary to recently administered  amiodarone.  As the patient has a known history of classic-appearing  atrial flutter by EKG, I therefore elected to perform cavotricuspid  isthmus ablation.  A 7-French [place] scientific blazer IIXB 8-mm  ablation catheter was therefore introduced through the  right common  femoral vein and advanced into the right atrium.  The ablation catheter  was positioned along the usual cavotricuspid isthmus.  A series of  radiofrequency applications were delivered at 50 watts with a target  temperature of 60 degrees for 120 seconds each along the usual  cavotricuspid isthmus.  Following ablation, double potentials were  recorded along the cavotricuspid isthmus.  Additional mapping was  performed along the cavotricuspid isthmus and radiofrequency ablation  was applied at areas where atrial activation remained present.  Following ablation, the stimulus earliest atrial activation recorded  across the cavotricuspid isthmus measured 150 milliseconds.   Differential atrial pacing was performed from the low lateral right  atrium, which revealed complete bidirectional isthmus block.  The  patient was observed without return of conduction through her isthmus.  Rapid atrial pacing was again performed down to a cycle length of 250  milliseconds with no tachycardias induced.  Following ablation, the AH  interval measured 137 milliseconds with an HV interval of 48  milliseconds.  The procedure was therefore considered completed.  All  catheters were removed and the sheaths were aspirated and flushed.  The  sheaths were removed and hemostasis was assured.  There were no early  apparent complications.   CONCLUSIONS:  1. Sinus rhythm upon presentation.  2. No inducible atrial arrhythmias as the patient has been initiated      recently on amiodarone.  3. Successful cavotricuspid isthmus ablation with complete      bidirectional isthmus block achieved.  4. No inducible arrhythmias following ablation.  5. No early apparent complications.      Hillis Range, MD  Electronically Signed     JA/MEDQ  D:  06/17/2008  T:  06/18/2008  Job:  [id number]   cc:   Cristy Hilts. Jacinto Halim, MD

## 2010-06-09 NOTE — Assessment & Plan Note (Signed)
[facility]   NAMECOURTLYNN, [name]               ACCOUNT NO.:  [id number]   MEDICAL RECORD NO.:  [id number]      DATE OF BIRTH:  1919/01/31   PHYSICIAN:  Maxwell Caul, M.D. VISIT DATE:  02/24/2007                                   OFFICE VISIT   I have been following [name] for bilateral heel ulcers.  These were  initially Wagner's grade 2 wounds on her bilateral heels.  She has been  using antibacterial soap, Bactroban and protective dressing.  She has  been using healing sandals.   PHYSICAL EXAMINATION:  VITALS:  On examination her vitals are stable.  She is afebrile with a temperature of 97.9, both of these wounds  continue to be improving.  There was no evidence of infection.   Wagner's grade 2 wounds which have been refractory secondary to  inadequate pressure relief.  Currently the areas continue to be  improved.  I think these will probably be able to be declared resolved  in the next 2-3 weeks.           ______________________________  Maxwell Caul, M.D.     MGR/MEDQ  D:  02/24/2007  T:  02/25/2007  Job:  [id number]

## 2010-06-09 NOTE — H&P (Signed)
[name], [name] NO.:  [id number]   MEDICAL RECORD NO.:  [id number]          PATIENT TYPE:  INP   LOCATION:  1824                         FACILITY:  [facility]   PHYSICIAN:  Lonia Blood, M.D.DATE OF BIRTH:  Aug 29, 1919   DATE OF ADMISSION:  11/27/2006  DATE OF DISCHARGE:                              HISTORY & PHYSICAL   PRIMARY CARE PHYSICIAN:  Merlene Laughter. Renae Gloss, M.D.   CHIEF COMPLAINT:  Syncope.   HISTORY OF PRESENT ILLNESS:  [name] is a very pleasant 77-  year-old female with a past medical history as detailed below.  She has  recently undergone some oral surgery requiring apparently multiple teeth  to be pulled from her mouth with subsequently sutures intraorally.  This  was Monday of last week, and it is presently Sunday.  Apparently the  patient also has a history of chronic watery diarrhea and has been given  a diagnosis of irritable bowel syndrome.  The patient apparently was in  her usual state of health, however, until today while she was attending  church.  The patient apparently simply suffered a syncopal spell.  There  was no witnessed seizure-type activity.  There were no apparent  presyncopal warnings.  The patient was unconscious for quite some time.  EMS was consulted.  Upon arrival, EMS placed the patient on the monitor  and found her to be experiencing significant bradycardia.  The exact  heart rate is not documented but is reported to have been in the 40s.  The patient was then dosed with fentanyl and Versed, and a  transcutaneous pacer was placed.  Atropine apparently was not given at  any point.  The patient was brought emergently to the emergency room.  Upon arrival in the emergency room prior to rolling the patient into the  resuscitation room, apparently the patient was intubated.  Upon arrival  in the resuscitation room, the ER physician removed the ET tube and the  transcutaneous pacer.  The patient then actually  began to awaken.  Heart  rate was sustained in the 50s.  She did have significant bleeding from  the oral cavity about the area of her sutures.  During her stay in the  emergency room, she had 2 episodes of explosive watery diarrhea which  were mixed with a significant amount of bright red blood.  This was  guaiac tested and proved to be positive for confirmatory purposes.  The  patient was noted to have multiple episodes of projectile vomiting while  in the emergency room as well.  This was also marked with some bright  red blood.  Immediately prior to these episodes, the patient would brady  down, with heart rate dropping as low as the low 30s.  Immediately after  vomiting, the patient's heart rate would then climb back up to 50.  This  happened at least 2-3 times during the patient's emergency room visit.   At the time of my evaluation, the patient was resting comfortably on the  hospital stretcher.  She denies chest pain. She denies fevers or chills.  She does  report some pain in the mouth.  She does not specifically  remember passing out at church.  She does report that she has had  significant increase in watery stool above her baseline for the past  week.  She denies any previous history of hematochezia or melena.  She  denies headache, fevers, or chills.  She is alert and oriented at the  present time surprisingly despite fentanyl and Versed.   REVIEW OF SYSTEMS:  Comprehensive review of systems is carried out.  It  is entirely negative, with the exception of the positive elements noted  in the history of present illness above.   PAST MEDICAL HISTORY:  1. Chronic right heel ulcer with ongoing care [facility].  2. Diabetes mellitus, type 2.  3. Hypothyroidism.  4. Hyperlipidemia.  5. Hypertension.  6. Reported history of coronary artery disease but no quantification      of this anywhere in old records.  7. History of right humeral fracture in March of  2008 status post      closed reduction with percutaneous pinning.  8. Degenerative joint disease status post L4-L5 decompression by Dr.      Barnett Abu.  9. Status post total abdominal hysterectomy/bilateral salpingo-      oophorectomy.   OUTPATIENT MEDICATIONS (PER MOST RECENT OFFICE NOTE):  1. Aspirin 81 mg daily.  2. Protonix 40 mg daily.  3. Norvasc 5 mg daily.  4. Vytorin 10/20 q.h.s.  5. Cymbalta 30 mg daily.  6. Toprol-XL 100 mg daily.  7. Synthroid 115 mcg daily.  8. Lantus 25 units q.12h.  9. Possibly Flomax 0.4 mg daily.   ALLERGIES:  MORPHINE SULFATE.   FAMILY HISTORY:  Noncontributory secondary to age.   SOCIAL HISTORY:  The patient does not smoke, she does not drink.  She  lives alone.  She lives in [place].   DATA REVIEW:  Hemoglobin 12.  White count and platelet count are normal.  MCV is normal.  Electrolytes are all normal.  BUN is mildly elevated at  19, creatinine is elevated at 1.6, representing a significant decrease  in GFR in this patient.  Serum glucose is 86.  BNP is 39.  INR is  normal.  PTT is normal.  Urinalysis reveals greater than 1000 glucose  but is otherwise unremarkable.  Stools are guaiac-positive.   Chest x-ray reveals no acute disease.  12-lead EKG reveals a sinus  bradycardia with no other acute abnormalities appreciated.   PHYSICAL EXAMINATION:  VITAL SIGNS:  The patient is afebrile.  Heart  rate is 56-46.  Blood pressure is 108/39, respiratory rate 21, O2  saturation 100% on 2 liters per minute nasal cannula.  GENERAL:  Well-developed, well-nourished female in no acute respiratory  distress.  HEENT:  Normocephalic, atraumatic.  Pupils are pinpoint and consistent  with narcotic pain medication administration but are equal and round and  minimally reactive.  Oral cavity is edentulous with evidence of bleeding  from the lower jaw region/gum.  NECK:  No JVD, no lymphadenopathy.  LUNGS:  Clear to auscultation bilaterally without  wheezes or rhonchi.  CARDIOVASCULAR:  Bradycardic but regular without gallop or rub.  Normal  S1 and S2.  ABDOMEN:  Soft.  Bowel sounds present.  Mildly tympanic.  No  hepatosplenomegaly.  No rebound, no ascites.  EXTREMITIES:  Trace bilateral lower extremity edema with cyanosis or  clubbing.  NEUROLOGIC:  The patient is alert and oriented, though she is  somewhat  lethargic which is consistent with Versed and fentanyl.  She moves all 4  extremities spontaneously.  She displays 4+/5 strength in the bilateral  upper and lower extremities.  She is intact to sensation and touch  throughout.  Cranial nerves II-XII are intact bilaterally.  There is no  Babinski.   IMPRESSION AND PLAN:  1. Syncope.  The patient has displayed very clear-cut vasovagal      syncope on 2 distinct episodes during her emergency room stay.      This appears to be primarily a gastrointestinal issue that is      bringing about recurrent episodes of vasovagal syncope.      Monitoring, despite transcutaneous pacing that was initiated with      emergency medical services, has not revealed evidence of a complete      heart block or even a second-degree heart block.  We will hold the      patient's negative chronotropes, and we will follow her heart rate      closely on telemetry monitoring.  Further evaluation will be      carried out if the patient's syncope continues after her      gastrointestinal issues have resolved.  2. Gastroenteritis versus infectious colitis.  It does not appear that      the patient has been on antibiotics recently.  She appears to have      a history of chronic explosive diarrhea.  It does appear that this      recent bout has been worse than her baseline, however.  We will      check her for Clostridium difficile toxin x3 total.  We will      monitor her hemoglobin closely.  We will hydrate her and treat her      symptomatically at the present time.  3. Gastrointestinal bleeding.  The  patient is bleeding from her mouth      which is consistent with her recent oral surgery.  The patient is      also having some bright red blood per rectum.  This is likely      secondary to severe irritation due to the patient's diarrhea.  We      will monitor her hemoglobin closely, however, and consider further      evaluation as necessary.  It is not likely that a colonoscopy will      be appropriate at this time, however, given the patient's severe      vasovagal reaction and multiple episodes of bradycardia.  4. Diabetes mellitus, type 2.  We will place the patient on sliding      scale insulin.  Will follow her capillary blood glucoses very      closely.  We will keep her on a clear liquid diet for now because      of her potential need for gastrointestinal intervention.  5. Right heel ulcer.  The patient has a chronic right heel ulcer and      is undergoing continuous care per [facility].  We will keep her right heel elevated at all times,      and we will ask wound care to comment on the most appropriate      dressing to be used at this time.  6. Hyperlipidemia.  We will hold the patient's Vytorin for now until      we can assure that she is stable  from a medical standpoint.  7. Hypertension.  We will hold the patient's Norvasc as well as her      Toprol-XL for now, and we will follow her blood pressure and heart      rate closely.      Lonia Blood, M.D.  Electronically Signed     JTM/MEDQ  D:  11/27/2006  T:  11/27/2006  Job:  [id number]   cc:   Merlene Laughter. Renae Gloss, M.D.

## 2010-06-09 NOTE — Assessment & Plan Note (Signed)
[facility]   NAMEWILL, [name]               ACCOUNT NO.:  [id number]   MEDICAL RECORD NO.:  [id number]      DATE OF BIRTH:  [birth year]   PHYSICIAN:  Maxwell Caul, M.D. VISIT DATE:  10/28/2006                                   OFFICE VISIT   PURPOSE OF TODAY'S VISIT:  We have been following [name] for now 2  wounds, one on her right first metatarsal head and the other on the  point of insertion of her right Achilles tendon on her calcaneus.  She  has been washing these daily, applying Polysporin and protecting these  areas with a PRAFO boot.   WOUND EXAMINATION:  She has resolved the area on the left first  metatarsal head.  The area on her right calcaneus has contracted to 1.5  x 1.5 x 0.1, this is 26% -50% epithelialized.  I did a full-thickness  debridement of some remaining eschar with a #15 blade and EMLA for  anesthesia.  On the base of this there is good granulation and good  epithelialization.   IMPRESSION:  1. Wagner's grade 2 diabetic foot wounds.  She only has one remaining      wound here and this is on her heel.  I think this is going to heal      as well with the simple measures she has been doing combined with      pressure relief.  I have not changed the treatment regimen,  we      will see her again in 2 weeks.           ______________________________  Maxwell Caul, M.D.     MGR/MEDQ  D:  10/28/2006  T:  10/29/2006  Job:  [id number]

## 2010-06-09 NOTE — Assessment & Plan Note (Signed)
[facility]   NAMEARIE, GABLE               ACCOUNT NO.:  [id number]   MEDICAL RECORD NO.:  [id number]      DATE OF BIRTH:  Jun 28, 1919   PHYSICIAN:  Maxwell Caul, M.D. VISIT DATE:  10/07/2006                                   OFFICE VISIT   LOCATION:  [facility].   PURPOSE OF TODAY'S VISIT:  Followup of three separate wounds.  In the  interim, she has been using an Ace wrap on the left leg, Iodosorb and a  healing sandal to the 2 wounds on her right foot; that is her right  first metatarsal head and right heel.   EXAMINATION:  Temperature is 98.3, pulse 75, respirations 18, blood  pressure 126/61, blood glucose is 175.  The 2 wounds on the right first  metatarsal head and right heel, both required some removal of macerated  tissue from around the wound, especially the area on the left heel.  There was a small necrotic area in the center that also underwent a full-  thickness debridement with EMLA for anesthesia, and we used a #15 blade.  To the right heel, I removed a large amount of macerated tissue.  To the  right first metatarsal head, we also removed a large amount of callus  and macerated tissue.  The area underneath this looked fairly healthy.  The area on the right heel also appears to be healthy without  significant evidence of cellulitis.   WOUND CARE PLAN AND FOLLOWUP:  I have declared the wound #3 on the left  anterior lower leg resolved.  There are no open wounds on the left foot.  The healing sandal  is not necessary.  To the 2 wounds on the right  heel,  I have discontinued the Iodosorb, thinking that this may be  injuring surrounding skin.  We are simply going to apply Polysporin and  a nonadherent dressing to both of these wounds.  I have also changed her  to a PRAFO boot to allow more protection for the back of her heel, which  seems to be inadequate currently in the healing sandal.  Will see her  again in a  week.           ______________________________  Maxwell Caul, M.D.     MGR/MEDQ  D:  10/07/2006  T:  10/08/2006  Job:  [id number]

## 2010-06-09 NOTE — H&P (Signed)
[name], [name] NO.:  [id number]   MEDICAL RECORD NO.:  [id number]          PATIENT TYPE:  INP   LOCATION:  3730                         FACILITY:  [facility]   PHYSICIAN:  Eduard Clos, MDDATE OF BIRTH:  10/18/1919   DATE OF ADMISSION:  07/11/2008  DATE OF DISCHARGE:                              HISTORY & PHYSICAL   PRIMARY CARE PHYSICIAN:  Lenon Curt. Chilton Si, M.D.   CHIEF COMPLAINT:  The patient had a fall.   HISTORY OF PRESENT ILLNESS:  This is an 77 year old female who had  recent atrial flutter with ablation on Jun 19, 2008 who was brought into  the ER at [facility] but the patient had a fall and bruise was found on  the lower back.  The patient had x-rays done which did not show any  acute fracture but the patient was found to have hyperkalemia with no  EKG changes per ER physician.  Initial potassium level was 6.2.  Repeat  was 5.8.  The patient was giving 30 grams of Kayexalate and was admitted  for observation.  Presently the patient is having supper with no  complaints.  Denies any pain, chest pain or shortness of breath,  palpitations, dizziness or focal deficit.  Denies any abdominal pain,  nausea or vomiting, headaches.  Denies any neck pain  The patient has  been admitted for further observation for hyperkalemia.  The patient was  originally instructed to be on Coumadin for 30 days and then discontinue  and that would be around June 26 and continue on aspirin.  As the  patient has history of recurrent falls,  she will be discontinued on  Coumadin as the patient already has a bruise on her back  and this may  worsen.   PAST MEDICAL HISTORY:  1. Atrial flutter status post ablation.  2. Diabetes mellitus type 2.  3. Hypertension.  4. Hypothyroidism.  5. Chronic renal  insufficiency.  6. Preserved left ventricular function.   PAST SURGICAL HISTORY:  Ablation on Jun 17, 2008 for atrial flutter.   MEDICATIONS PRIOR TO ADMISSION:  1.  Synthroid 75 mcg p.o. daily.  2. Cymbalta 60 mg p.o. at bedtime.  3. Protonix 40 mg daily.  4. Lasix 20 mg daily.  5. Questran 4 grams p.o. b.i.d.  6. Neurontin 300 mg at bedtime.  7. Flomax 0.4 mg p.o. daily.  8. Lantus insulin 40 units subcutaneous at bedtime.  9. Coumadin 5 mg p.o. daily.  10.Multivitamin one tablet p.o. daily.   ALLERGIES:  MORPHINE SULFATE, CONTRAST AND EPHEDRINE.   SOCIAL HISTORY:  The patient denies smoking cigarettes, drinking alcohol  and using illegal drugs.   FAMILY HISTORY:  Noncontributory.   REVIEW OF SYSTEMS:  As in history of present illness, nothing else  significant.   PHYSICAL EXAMINATION:  The patient examined at bedside having supper,  not in acute distress.  VITAL SIGNS:  Blood pressure 170/59, pulse 76 per minute, temperature  98, respiratory rate  18 per minute.  O2 saturation 96% on room air.  HEENT:  Anicteric.  No pallor.  CHEST:  Bilateral air entry present. No rhonchi or crepitation.  HEART:  S1 and S2 heard.  ABDOMEN:  Soft, nontender.  Bowel sounds are heard.  CENTRAL NERVOUS SYSTEM:  Alert, awake and oriented to time, place and  person.  Moves upper and lower extremities 5/5.  EXTREMITIES:  There is a bruise on the lower back which is measuring  around 10 x 5 cm, nontender.  Peripheral pulses felt, no edema.  The  patient is able to move extremities without any difficulty and there is  no joint swelling and no obvious external injuries other than the bruise  which was explained already.   LABORATORY DATA:  EKG shows June 15:  pattern.  I did discuss with ER  physician who said EKG done today did not show any acute features.  We  do not have EKG today; we will repeat an EKG.   CBC - WBC 10.8, hemoglobin 13.4, hematocrit 40.9, platelets 218,  neutrophil 21%.  PT INR 36.6 and 3.3.  Basic metabolic profile -  2136, potassium 5.8, chloride 105, carbon dioxide 23, glucose 253, BUN  38, creatinine 1.6, calcium 8.6.  Urinalysis  shows  LE large and WBC too  numerous to count.   ASSESSMENT:  1. Hyperkalemia.  2. Status post mechanical fall with bruise.  3. Atrial flutter with ablation.  4. Diabetes mellitus type 2.  5. Hyperlipidemia.  6. History of depression.  7. History of hypothyroidism.   PLAN:  1. Will admit the patient to telemetry floor.  2. The patient has already got Kayexalate 30 grams p.o. in the ER  and      has had a good bowel movement.  Will repeat BMET in a.m.  Will also      check CBC.  3. Will hold Coumadin.  The patient is not a candidate for Coumadin.      Coumadin was to be discontinued on June 26- we will discontinue it      now.  If hemoglobin is stable, will discharge on aspirin full dose.  4. Will follow PT/INR daily until discharge.  5. Will get a PT / OT consult.  6. Will follow BMET carefully for her chronic kidney disease.   Further recommendation as condition evolves.      Eduard Clos, MD  Electronically Signed     ANK/MEDQ  D:  07/11/2008  T:  07/11/2008  Job:  [id number]   cc:   Lenon Curt. Chilton Si, M.D.

## 2010-06-09 NOTE — Consult Note (Signed)
[name], [name]               ACCOUNT NO.:  [id number]   MEDICAL RECORD NO.:  [id number]          PATIENT TYPE:  OUT   LOCATION:  GYN                          FACILITY:  [facility]   PHYSICIAN:  John T. Kyla Balzarine, M.D.    DATE OF BIRTH:  1919-10-05   DATE OF CONSULTATION:  02/07/2007  DATE OF DISCHARGE:                                 CONSULTATION   CHIEF COMPLAINT:  Adnexal masses, with elevated CA-125 value, seen at  the request of Dr. Renaldo Fiddler for recommendations regarding management.   HISTORY OF PRESENT ILLNESS:  This patient was admitted to [facility] in November 2009 after a syncopal episode associated with  bradycardia.  She has ischemic colitis and was evaluated with an  abdominopelvic CT scan.  She had bilateral adnexal masses noted, which  were approximately 4.3 cm on the left and 6.4 cm on the right.  There  was no evidence of ascites or carcinomatosis, but there was  circumferential wall thickening and pericolic edema/inflammation of the  descending colon and splenic flexure.  A CA-125 value was borderline  elevated at 56.4, and ultrasound during that hospitalization was  nondiagnostic secondary to marked bowel gas.  She was subsequently seen  by Dr. Renaldo Fiddler and pelvic ultrasound repeated in early December 2008.  Left adnexal mass measured 4.8 x 6.1 x 4.6 cm, but was difficult to  visualize because of the colon.  On the right, there was a 6.1 x 4.9 x 5  cm mass, with internal echoes and calcifications, with a small amount of  fluid noted in the cul-de-sac.  CA-125 value was to be repeated, but  unfortunately the wrong tumor markers were sent.  The patient does have  difficulty with transportation.  In the interval since she was discharge  from the hospital, she has had marked improvement in her symptoms of  diarrhea, being treated with daily Questran.  She currently is  nonambulatory because of diabetic foot ulcers.  She denies early  satiety.   PAST MEDICAL  HISTORY:  1. Acute ischemic colitis, confirmed radiographically and by      colonoscopy in November 2008.  2. Type II diabetes mellitus, insulin requiring.  3. Hypothyroidism.  4. Dyslipidemia.  5. Hypertension.  6. Coronary artery disease.  7. Distal peripheral neuropathy.  8. Chronic foot ulcers.   PAST SURGICAL HISTORY:  Abdominal hysterectomy and partial removal of  an ovary.  The patient is not clear with what exactly was performed,  and the ovary might have been completely removed.   MEDICATIONS:  Synthroid, Norvasc, Protonix, Lantus, Cymbalta, Vytorin,  and Questran.   She is undergoing topical wound care and is on enforced bedrest.   ALLERGIES:  None known. personal social history on sorry in addition to   FAMILY HISTORY:  Sister with postmenopausal breast cancer, but no known  ovarian cancer.   PERSONAL AND SOCIAL HISTORY:  Remote tobacco use, rare ethanol use.   REVIEW OF SYSTEMS:  The patient does have some urinary urgency, with no  incontinence.  She denies pelvic pain or vaginal bleeding, and other  than  noted above, negative 10-point review.   PHYSICAL EXAMINATION:  VITAL SIGNS:  Weight 166 pounds, blood pressure  129/62, pulse 85, temperature afebrile.  GENERAL:  The patient is alert and oriented x3, in no acute distress.  LYMPH NODE SURVEY:  No pathologic lymphadenopathy.  LUNGS:  Clear.  HEART:  Regular rate and rhythm, with no gallop or JVD.  ABDOMEN:  Soft and benign, with well-healed Pfannenstiel incision.  No  tenderness, ascites, mass, or hernia.  EXTREMITIES:  Diminished strength.  The feet are bandaged, and there is  trace pretibial edema, with stasis changes below the knee.  No cords or  Homans'.  PELVIC:  External genitalia and BUS are normal to inspection and  palpation.  The bladder and urethra are normal, and vagina is atrophic  but well supported.  No mucosal lesions.  Bimanual and rectovaginal  examinations disclose no mass or  nodularity.   LABORATORIES:  Scans and CA-125 results as above.   ASSESSMENT:  Complex adnexal masses, with low-grade elevation of CA-125  value in the setting of ischemic colitis.   RECOMMENDATIONS:  CA-125 value will be obtained today and, if elevated  persist significantly count 56 (doubling), we would pursue repeat  imaging and surgery.  However, if the CA-125 value is stable or falling,  I would repeat an ultrasound in approximately 6-8 weeks.  We plan  reevaluation after her ultrasound.      John T. Kyla Balzarine, M.D.  Electronically Signed     JTS/MEDQ  D:  02/07/2007  T:  02/08/2007  Job:  [id number]   cc:   Isidor Holts, M.D.   Merlene Laughter. Renae Gloss, M.D.  Fax: [phone]   Georgiana Spinner, M.D.  Fax: [phone]   Zelphia Cairo, MD  Fax: [phone]   Telford Nab, R.N.  [phone] [address]  [place], [place] [address]

## 2010-06-09 NOTE — Assessment & Plan Note (Signed)
[facility]   [name], [name]               ACCOUNT NO.:  [id number]   MEDICAL RECORD NO.:  [id number]      DATE OF BIRTH:  [birth year]   PHYSICIAN:  Maxwell Caul, M.D. VISIT DATE:  10/14/2006                                   OFFICE VISIT   [name] is a patient here we have been following for three separate  wounds.  However, a wound on her anterior left leg had healed.  She has  two remaining areas, a very tiny area on the lateral aspect of her first  metatarsal head and a wound on the posterior aspect of her right  calcaneus.  In the interim we have simply been applying Polysporin to  these wounds after daily antiseptic wash and protecting the area with a  Prafo boot.   WOUND CARE EXAMINATION:  She has two very tiny areas.  She has a tiny  area on her right first metatarsal head that measures 0.3 x 0.1 x 0.1.  This almost looks like it is going to heal.  I am not expecting any  further trouble.  The area on the posterior aspect of the right heel  measures 1.7 x 1.9 x 0.1.  There is some epithelialization present here.  The wound is generally dry and granulating.  There is a small amount of  eschar that I did not disturb today.   WOUND CARE PLAN AND FOLLOWUP:  I am going to continue the same approach  of antiseptic soap washes, Polysporin nonadherent dressing, and continue  her in the Prafo with aggressive pressure relief.  I think the area in  the right first metatarsal head will heal.  The area on the right heel  may need some debridement.  However, right now I think we are fine to  continue this and see what simple pressure relief will do to the healing  process.           ______________________________  Maxwell Caul, M.D.     MGR/MEDQ  D:  10/14/2006  T:  10/14/2006  Job:  [id number]

## 2010-06-09 NOTE — Assessment & Plan Note (Signed)
[facility]   NAME:  [name], [name]               ACCOUNT NO.:  [id number]   MEDICAL RECORD NO.:  [id number]      DATE OF BIRTH:  01-02-1920   PHYSICIAN:  Maxwell Caul, M.D. VISIT DATE:  02/10/2007                                   OFFICE VISIT   [name] is a lady with Wagner's grade 2 wounds on her bilateral  heels.  When I saw her last week, we transitioned her into healing  sandals.  She has been cleaning these areas with antibacterial soap,  applying Bactroban and protective dressings.  I counseled her about  pressure relief on this area.   PHYSICAL EXAMINATION:  On examination, her temperature is 97.8, pulse  76, respirations 16, blood pressure 120/60.  Both of these wounds appear  to be  contracting today, much better than last week.  There was no  evidence of infection.  Nothing needed culturing.   IMPRESSION:  Wagner's grade 2 wounds which have been refractory  secondary to  inadequate pressure relief.  Currently today these appear  better.  I continued the current regimen of Bactroban protective  dressing, some bilateral heel sandals.  I have counseled her again about  pressure maneuvers to keep her heels from receiving direct pressure,  especially in bed, etc.  I will see these again next week.   The patient again brought in a request for a power scooter; however, at  this point I am skeptical that this will be necessary.           ______________________________  Maxwell Caul, M.D.     MGR/MEDQ  D:  02/10/2007  T:  02/10/2007  Job:  [id number]

## 2010-06-09 NOTE — Discharge Summary (Signed)
NAMEJORDEN, MAHL               ACCOUNT NO.:  [id number]   MEDICAL RECORD NO.:  [id number]          PATIENT TYPE:  INP   LOCATION:  1311                         FACILITY:  [facility]   PHYSICIAN:  Charlestine Massed, MDDATE OF BIRTH:  [birth year]   DATE OF ADMISSION:  02/21/2008  DATE OF DISCHARGE:  02/26/2008                               DISCHARGE SUMMARY   PRIMARY CARE PHYSICIAN:  Massie Maroon, MD   REASON FOR ADMISSION:  Recurrent falls at home.   HOSPITAL COURSE:  Ms. Yanessa Hocevar is a sweet, 77 year old female who  lives alone at her home and with prior history of hypothyroidism,  coronary artery disease, dyslipidemia, type 2 diabetes.  She comes to  the emergency room after she fell at home.  She was going to the  bathroom when she tripped and fell and she had pain in the right ankle  and she did not hit her head.  She stated that she had fallen multiple  times in the past and recently she has been falling many times  repeatedly.  The patient was admitted to the hospital and was evaluated.   Her CT scan of the head did not reveal any acute issues.  Chest x-ray  and other imaging did not reveal any fracture.  Pelvis, there was no  pelvic fracture. There was swelling in the right ankle, where she  possibly had a sprain, but x-rays of ankle did not reveal any fracture.   The patient was seen by physical therapy and occupational therapy.  They  advised nursing home placement in view of her recurrent falls and  therapy at the nursing home.  This issue was discussed with the patient  and she agrees to go to the nursing home as soon as the placement is  found.  Once placement is ready, the patient will be discharged to the  nursing home.   Her medical conditions are otherwise stable and she is ready for  discharge.   DISCHARGE DIAGNOSES:  1. Recurrent falls with gait instability.  2. Coronary artery disease.  3. Dyslipidemia.  4. Diabetes mellitus type 2.  5.  Hypothyroidism.  6. Incontinence.   DISCHARGE MEDICATIONS:  1. Cholestyramine 2 scoops p.o. b.i.d. with meals, morning and      evening.  2. Cymbalta 60 mg p.o. daily for neuropathy.  3. Vytorin 10/20 mg one tablet p.o. daily.  4. Neurontin 300 mg p.o. q.h.s.  5. Lantus insulin 34 units subcutaneously q.h.s.  6. NovoLog insulin q.a.c. and q.h.s. coverage, sensitive scale.  7. Levothyroxine 300 mg p.o. daily on Monday, Wednesday and Friday and      200 mg p.o. on Saturday, Sunday, Tuesday and Thursday.  8. Flomax 0.4 mg p.o. q.h.s.   DISPOSITION:  The patient is discharged to a skilled nursing facility.   DISCHARGE INSTRUCTIONS:  Follow up with primary care physician, Dr.  [name], in one week and then as needed.   ALLERGIES:  Ephedrine AND MORPHINE SULFATE.   A total of 45 minutes was spent on this discharge.      Charlestine Massed, MD  Electronically  Signed     UT/MEDQ  D:  02/26/2008  T:  02/26/2008  Job:  [id number]   cc:   Massie Maroon, MD  Fax: [phone]

## 2010-06-09 NOTE — Op Note (Signed)
NAMEDIASHA, CASTLEMAN NO.:  [id number]   MEDICAL RECORD NO.:  [id number]          PATIENT TYPE:  INP   LOCATION:  2917                         FACILITY:  [facility]   PHYSICIAN:  Georgiana Spinner, M.D.    DATE OF BIRTH:  04/10/1919   DATE OF PROCEDURE:  DATE OF DISCHARGE:                               OPERATIVE REPORT   PROCEDURE:  Upper endoscopy.   INDICATIONS:  Questionable melena.   ANESTHESIA:  Fentanyl 50 mcg, Versed 5 mg.   PROCEDURE:  With the patient mildly sedated in the left lateral  decubitus position, the Pentax videoscopic endoscope was inserted in the  mouth, passed under direct vision through the esophagus which appeared  normal and through the stomach via hiatal hernia.  The fundus, body,  antrum, duodenal bulb, second portion of the duodenum were visualized.  From this point, the endoscope was slowly withdrawn, taking  circumferential views of the duodenal mucosa until the endoscope had  been pulled back into the stomach, placed in retroflexion to view  stomach from below.  The endoscope was straightened and withdrawn,  taking circumferential views in the remaining gastric and esophageal  mucosa.  The patient's vital signs and pulse oximetry remained stable.  The patient tolerated the procedure well without apparent complications.   FINDINGS:  Hiatal hernia, otherwise, an unremarkable examination.   PLAN:  Proceed to colonoscopy.           ______________________________  Georgiana Spinner, M.D.     GMO/MEDQ  D:  11/29/2006  T:  11/29/2006  Job:  [id number]

## 2010-06-09 NOTE — Assessment & Plan Note (Signed)
[facility]   [name], [name]               ACCOUNT NO.:  [id number]   MEDICAL RECORD NO.:  [id number]      DATE OF BIRTH:  [birth year]   PHYSICIAN:  Theresia Majors. Tanda Rockers, M.D. VISIT DATE:  09/09/2006                                   OFFICE VISIT   SUBJECTIVE:  [name] returns for follow up of a post traumatic stasis  ulcer on her left lower extremity and 2 separate foci of neuropathic  ulcers on her right foot.  She also had a skin a tear on her right  forearm.  She reports that all 4 wounds have improved significantly.  There has been no excessive drainage, malodor, pain, or fever.  She is  accompanied by her daughter.   OBJECTIVE:  Blood pressure is 120/55, respirations 18, pulse rate 66,  temperature 97.1.  Capillary blood glucose is 170 mg%.  Inspection of  wound #4 on the forearm shows that it is completely resolved.  Wound #3  on the anterior left lower extremity has 100% granulation with active  contraction and advance in epithelium from the perifory.  No debridement  was needed.  Wound #2 on the right heel shows significant contraction.  No evidence of infection with retained impacted Iodosorb.  Similarly,  wound #1 has impacted Iodosorb and no drainage, no evidence of  infection.  There is no evidence of associated vascular compromise,  abscess, lymphangitis, or cellulitis.   ASSESSMENT:  Clinical improvement.   PLAN:  We will return the patient to a compression wrap to her left  lower extremity.  Reevaluate her in 1 week.  She will continue to use  Iodosorb and local cleansing for wounds #1 and 2.      Harold A. Tanda Rockers, M.D.  Electronically Signed     HAN/MEDQ  D:  09/09/2006  T:  09/10/2006  Job:  [id number]

## 2010-06-09 NOTE — Assessment & Plan Note (Signed)
[facility]   NAME:  LADIAMOND, GALLINA NO.:  [id number]   MEDICAL RECORD NO.:  [id number]      DATE OF BIRTH:  1919-10-25   PHYSICIAN:  Maxwell Caul, M.D. VISIT DATE:  08/26/2006                                   OFFICE VISIT   LOCATION:  [facility]   Mrs. [name] is an 77 year old woman that we are following for several  different wounds.  Initially, she had 2 diabetic foot ulcers,  one on  the right first metatarsal head, the other on the right heel.  In the  interim,  she fell creating a linear ulceration on the left anterior  lower leg and a skin tear on the right forearm.  Since last visit, she  has continued with Iodosorb to the right first metatarsal head and the  right heel with a modified healing sandal.  The left anterior leg has  been receiving a Silverlon dressing was Profore and we have been simply  using topical antibiotics to the right forearm.   PHYSICAL EXAMINATION:  Temperature is 97.5, pulse 65, respirations 16,  blood pressure 110/47, blood glucose 107.  Both of the wounds, which we have labeled #1 and #2 on the right first  metatarsal head and the right heel, appear to be making satisfactory  progress.  Will continue the Iodosorb the left anterior lower leg we  will continue this with Silverlon and the Profore.  This wound required  a full-thickness debridement however with EMLA for anesthesia and  hemostasis with direct pressure.  We used a number 15 blade.  Her right  forearm will continue with topical antibiotics.   IMPRESSION:  1. Wagner's grade 2 diabetic foot wounds.  I think these are making      satisfactory progress.  We will continue the Iodosorb and the      healing sandal.  2. Traumatic wound on the left anterior lower leg. Continue with the      Silverlon and Profore dressing.  3. Wound #4 on the right forearm. We will continue with topical      antibiotics and a protective  dressing.   She will be seen again in 10 days.           ______________________________  Maxwell Caul, M.D.     MGR/MEDQ  D:  08/26/2006  T:  08/26/2006  Job:  [id number]

## 2010-06-09 NOTE — Assessment & Plan Note (Signed)
[facility]   [name], [name]               ACCOUNT NO.:  [id number]   MEDICAL RECORD NO.:  [id number]      DATE OF BIRTH:  [birth year]   PHYSICIAN:  Maxwell Caul, M.D. VISIT DATE:  10/14/2006                                   OFFICE VISIT   PURPOSE OF TODAY'S VISIT:  Review of remaining wounds on her right foot.   [name] is a lady we have actually been following for 3 wounds,  however the wound on her left foot has resolved.           ______________________________  Maxwell Caul, M.D.     MGR/MEDQ  D:  10/14/2006  T:  10/14/2006  Job:  [id number]

## 2010-06-09 NOTE — Assessment & Plan Note (Signed)
[facility]   NAMENANDA, BITTICK               ACCOUNT NO.:  [id number]   MEDICAL RECORD NO.:  [id number]      DATE OF BIRTH:  1919-03-07   PHYSICIAN:  Maxwell Caul, M.D. VISIT DATE:  08/19/2006                                   OFFICE VISIT   Mrs. Bronaugh today returns for diabetic foot ulcers which are Wagner's  grade 2.  She has been applying Iodosorb gel to the wound on her right  heel and right fifth metatarsal head.  She is continued in her healing  sandal.  Unfortunately the last time she was here she developed a large  traumatic wound in her left anterior shin.  She tried to treat this  herself for roughly 5 days.  There is associated edema around this but  no evidence of infection.  This might have been amenable to suturing  when it first happened, however unfortunately she did not seek medical  advice quickly enough.  Finally, she has a skin tear on the anterior  surface of her right arm.   WOUND EXAM:  The areas on her right foot actually look considerably  better.  The right first metatarsal head has largely closed over as has  her right heel.  None of these needed debriding.  The large linear area  on her left anterior leg measures 5 x 1 x 0.7 x 0.1, this has a  granulating base, however this is going to be a slow process to heal.   WOUND CARE PLAN AND FOLLOW-UP:  We have continued with the Iodosorb to  the wound in the right first metatarsal head and right heel.  She will  continue in her healing sandal.  We have continued with the Silverlon  and Profore to the wound on the left anterior leg and I will apply  topical antibiotics and a nonadherent dressing to the small skin tear on  her right forearm.  I am fairly confident that the wounds on the right  foot will heal, however the left anterior leg wound is liable to be a  slow process.  We will see her again in a week.           ______________________________  Maxwell Caul,  M.D.     MGR/MEDQ  D:  08/19/2006  T:  08/20/2006  Job:  [id number]

## 2010-06-09 NOTE — Assessment & Plan Note (Signed)
[facility]   NAMEANETRA, CZERWINSKI               ACCOUNT NO.:  [id number]   MEDICAL RECORD NO.:  [id number]      DATE OF BIRTH:  1919-03-30   PHYSICIAN:  Theresia Majors. Tanda Rockers, M.D. VISIT DATE:  09/19/2006                                   OFFICE VISIT   SUBJECTIVE:  [name] is an 77 year old female who we follow for three  separate wounds.  In the interim the patient has had unusual exertion  due to out-of-town visitors and entertaining.  She reports there has  been a second injury on her right heel.  There has been no interim  fever.  There has been some serous bloody drainage onto her white sock.  There has been no pain.   OBJECTIVE:  Blood pressure is 118/85, respirations 16, pulse rate 64,  temperature 98.1, capillary blood glucose is 150 mg%.  She is  accompanied by her daughter.  Inspection of the lower extremity shows  that there is trace edema.  There is no evidence of ischemia.  Capillary  refill is brisk.  The right first metatarsal head ulcer is dry.  No  evidence of inflammation or drainage.  The right heel shows some  evidence of granulation with scant drainage.  Juxtaposed to the wound #2  is an extension of the ulcer with a frank blister that was denuded and  the cornified epithelium was excised.  Wound #3 on the left anterior  lower leg has 100% eschar with no drainage.   ASSESSMENT:  Improvement of wounds #1 and #3 with an interim secondary  injury to the right heel.   PLAN:  We will resume Iodosorb on all three wounds in conjunction with  antiseptic soap wash.  We will reevaluate the patient in 2 weeks.  With  regard to edema control, the patient will avoid prolonged dependency.  We have explained this change in wound care to the daughter in terms  that they seem to understand.  They express gratitude for having been  seen and indicate that they will be compliant.      Harold A. Tanda Rockers, M.D.  Electronically Signed     HAN/MEDQ  D:   09/19/2006  T:  09/20/2006  Job:  [id number]

## 2010-06-09 NOTE — Consult Note (Signed)
NAMEACCALIA, [name]               ACCOUNT NO.:  [id number]   MEDICAL RECORD NO.:  [id number]          PATIENT TYPE:  OUT   LOCATION:  GYN                          FACILITY:  [facility]   PHYSICIAN:  John T. Kyla Balzarine, M.D.    DATE OF BIRTH:  10-21-1919   DATE OF CONSULTATION:  03/21/2007  DATE OF DISCHARGE:                                 CONSULTATION   CHIEF COMPLAINT:  Follow-up of an adnexal mass and elevated CA-125  value.   HISTORY OF PRESENT ILLNESS:  This patient was admitted to [facility] in November 2008, and eventually was found to have ischemic  colitis.  On CT scan, she had bilateral adnexal masses with CA-125 value  elevated at 56.4 and ultrasound during that hospitalization was  nondiagnostic.  Pelvic ultrasound repeated in early December revealed  left adnexal mass measuring 4.8 x 6.1 x 4.6 and on the right is 6.1 x  4.9 x 5 cm mass.  A small amount of fluid was noted in the cul-de-sac at  that time.  I saw the patient for initial consultation in January and at  that time her CA-125 value was 13.4.  She has noted an improvement in  her bowel function, but despite having formed bowel movements, has very  frequent bowel movements.  She denies overt hematochezia.   PAST MEDICAL HISTORY:  1. Acute ischemic colitis in November 2008.  2. Type 2 diabetes.  3. Hypothyroidism.  4. Dyslipidemia.  5. Hypertension.  6. Coronary artery disease.  7. Distal peripheral neuropathy.  8. Chronic foot ulcers.   PAST SURGICAL HISTORY:  The patient is status post abdominal  hysterectomy and partial removal of an ovary.   MEDICATIONS:  Synthroid, Norvasc, Protonix, Lantus, Cymbalta, Vytorin  and Questran.   ALLERGIES:  None known.   PERSONAL AND SOCIAL HISTORY:  Remote tobacco use and rare ethanol use.   FAMILY HISTORY:  Sister with postmenopausal breast cancer, but no known  ovarian cancer.   REVIEW OF SYSTEMS:  The patient is currently asymptomatic with rare  urinary  urgency but no incontinence.  Bowel symptoms as above, but  otherwise denies pelvic pain or other pelvic symptoms.  General 10-point  review is negative.   PHYSICAL EXAMINATION:  VITAL SIGNS:  Weight 165 pounds, blood pressure  135/61.  GENERAL APPEARANCE:  The patient is alert and oriented x3, in no acute  distress.  ABDOMEN:  Soft and benign with no ascites or tenderness.  EXTREMITIES:  Full strength and range of motion.  PELVIC:  External genitalia and BUS, bladder and urethra and vagina are  clear.  Bimanual and rectovaginal examinations revealed vague fullness  high in the right, but no left-sided mass.   LABORATORY DATA:  CA-125 value was obtained this week and was 10.8.   A follow-up pelvic ultrasound performed March 20, 2007, reveals 5. 5  x 5 x 6.7 cm largely solid right adnexal mass.  The left adnexal mass  was not able to be visualized and there was a small amount of free  pelvic fluid, status post hysterectomy.   ASSESSMENT:  Stable, likely benign, right adnexal mass, asymptomatic.   PLAN:  The patient is not enthusiastic about any surgical intervention.  I would recommend follow-up scans at approximately six-month intervals  and would recheck one further CA-125 value at the time of her next scan.  This follow-up could be carried out under the auspices of Dr. Renae Gloss.  We would be glad to see the patient back at any time on a p.r.n. basis.      John T. Kyla Balzarine, M.D.  Electronically Signed     JTS/MEDQ  D:  03/21/2007  T:  03/22/2007  Job:  [id number]   cc:   Isidor Holts, M.D.   Merlene Laughter. Renae Gloss, M.D.  Fax: [phone]   Georgiana Spinner, M.D.  Fax: [phone]   Zelphia Cairo, MD  Fax: [phone]   Telford Nab, R.N.  [phone] [address]  [place], [place] [address]

## 2010-06-09 NOTE — H&P (Signed)
NAMEFAITHLYNN, DEELEY NO.:  [id number]   MEDICAL RECORD NO.:  [id number]          PATIENT TYPE:  EMS   LOCATION:  ED                           FACILITY:  [facility]   PHYSICIAN:  Peggye Pitt, M.D. DATE OF BIRTH:  [birth year]   DATE OF ADMISSION:  02/21/2008  DATE OF DISCHARGE:                              HISTORY & PHYSICAL   PRIMARY CARE PHYSICIAN:  Massie Maroon, M.D.   CHIEF COMPLAINT:  A fall.   HISTORY OF PRESENT ILLNESS:  Ms. Drum is a pleasant 77 year old  Caucasian woman who has a history of hypothyroidism, coronary artery  disease, and hyperlipidemia as well as type 2 diabetes mellitus who  presents to the emergency department today after a fall that occurred  around 3 a.m. in the morning.  The patient states she had woke up to go  to the bathroom, tripped over her purse that was on the floor and fell.  She denies any loss of consciousness.  She states she did hit her pelvis  and that has been quite painful.  She denies any chest pain, shortness  of breath, palpitations, dizziness, or light-headedness prior to the  fall.  Again, she never lost consciousness.  However, she could not  stand up off the floor.  Finally, she was able to roll to her nightstand  telephone and called 9-1-1 and she was brought into the emergency  department.  Here, she was found to have a urinary tract infection as  well as a glucose of 450 and for that reason we are called for further  evaluation and management.   ALLERGIES:  She has no known drug allergies.   PAST MEDICAL HISTORY:  Significant for:  1. Type 2 diabetes mellitus.  2. Hypothyroidism.  3. Hyperlipidemia.  4. Coronary artery disease.  5. Hypertension.   MEDICATIONS:  Unknown at this time.  I have left a message for the  patient's daughter, Meriam Sprague, to bring [place] a list of her medications.   SOCIAL HISTORY:  No alcohol, tobacco or illicit drug use.  Is widowed  and lives by herself.   FAMILY HISTORY:   Noncontributory in this elderly lady.   REVIEW OF SYSTEMS:  Negative except as already mentioned on HPI.   PHYSICAL EXAM:  VITAL SIGNS:  Upon admission blood pressure 156/67,  heart rate 92, respirations 20, O2 sat is 97% on room air with a temp of  97.5.  GENERAL:  She is alert, awake, oriented x3, does not appear to be in any  distress.  HEENT:  Normocephalic, atraumatic.  Her pupils are equal and reactive to  light and accommodation with intact extraocular movements.  NECK:  Supple, no JVD, no lymphadenopathy, no bruits, no goiter.  LUNGS:  Clear to auscultation bilaterally.  HEART:  Regular rate and rhythm with no murmurs, rubs or gallops.  ABDOMEN:  Soft, nontender, nondistended with positive bowel sounds.  EXTREMITIES:  No edema, positive pulses.  NEUROLOGIC EXAM:  Although she does have some generalized weakness, it  is grossly intact and nonfocal.   LABS:  Upon admission sodium 137, potassium 4.7,  chloride 103, bicarb  26, BUN 29, creatinine 1.29 with a glucose of 428.  All of her LFTs are  within normal limits with the exception of an albumin of 3.2.  WBC 15.7  with an ANC of 13.6, hemoglobin of 11.1, platelets of 319, a CK level of  52, first set of point of care markers are negative.  A urinalysis shows  large leukocyte esterase with many bacteria, RBCs too numerous to count  and WBCs 3-6.  She had a chest x-ray that showed a questionable left 7th  rib fracture.  A CT of the head showed atrophy, no acute findings.  A CT  scan of her C-spine showed advanced degenerative joint disease but no  acute findings.  A pelvic x-ray shows no acute findings.   ASSESSMENT AND PLAN:  1. Fall:  Again, no loss of consciousness, believe this was just      likely mechanical in nature, probably with a component of      dehydration secondary to her hyperglycemia and her UTI.  2. For urinary tract infection:  Will check a urine culture and      meanwhile will place her on Cipro twice  daily.  3. For her type 2 diabetes mellitus:  Given her elevated glucose, will      start her on some IV fluids as well as place her on the glucose      stabilizer protocol.  4. For her acute renal failure which is likely secondary to a      combination of dehydration secondary to her UTI and her      hyperglycemia:  Will start her on IV fluid hydration.  If this does      not resolve, she may need a more aggressive workup to include a      renal ultrasound to rule out hydronephrosis or obstruction.  5. For leukocytosis which is secondary to her UTI:  Will follow with      antibiotics.  6. For her hypothyroidism:  Will check a TSH.  Given the fact that we      do not know what dose of Synthroid she is on, will hold Synthroid      until her daughter comes back with her medications.  7. For her hyperlipidemia:  Will check a fasting lipid panel and will      start her on her home medications once they become available to [place].  8. For prophylaxis while in the hospital, place on Protonix for GI      prophylaxis and Lovenox for DVT prophylaxis.      Peggye Pitt, M.D.  Electronically Signed     EH/MEDQ  D:  02/21/2008  T:  02/21/2008  Job:  [id number]   cc:   Massie Maroon, MD  Fax: [phone]

## 2010-06-12 NOTE — Discharge Summary (Signed)
[name], BECKOM               ACCOUNT NO.:  [id number]   MEDICAL RECORD NO.:  [id number]          PATIENT TYPE:  INP   LOCATION:  1443                         FACILITY:  [facility]   PHYSICIAN:  Isidor Holts, M.D.  DATE OF BIRTH:  1919/04/08   DATE OF ADMISSION:  05/28/2006  DATE OF DISCHARGE:                               DISCHARGE SUMMARY   DISCHARGE DIAGNOSES:  1. Severe uncontrolled type 2 diabetes mellitus.  2. Dehydration and acute renal failure.  3. Fungal/Enterococcal urinary tract infection.  4. Hypothyroidism.  5. Dyslipidemia.  6. Hypertension.  7. Coronary artery disease.  8. History of right humerus fracture in March 2008, status post closed      reduction and percutaneous pinning on April 06, 2006.   DISCHARGE MEDICATIONS:  1. Enteric-coated aspirin 81 mg p.o. daily.  2. Protonix 40 mg p.o. daily.  3. Senokot-S one p.o. q.h.s.  4. Amlodipine 5 mg p.o. daily.  5. Vytorin (10-20), one p.o. q.h.s.  6. Cymbalta 30 mg p.o. daily.  7. Toprol XL 100 mg p.o. daily.  8. Multivitamin one p.o. daily.  9. Synthroid 150 mcg p.o. daily.  (Was on 175 mcg p.o. daily.)  10.Lantus insulin 23 units subcutaneously q.12h.   This medication list may of course be updated at the time of actual  discharge, by the discharging M.D.  Note: Glucophage has been discontinued.   PROCEDURE:  A portable chest x-ray dated May 27, 2006:  This showed no  active lung disease.  Fixation of the right femoral neck fracture is  noted.   CONSULTATIONS:  Dr. Vania Rea. Supple, orthopedic surgeon.   HISTORY:  As in the H&P notes of May 28, 2006, dictated by Dr. Lonia Blood. However, in brief this is an 77 year old female, with known  history of type 2 diabetes mellitus, hypothyroidism, hypertension,  dyslipidemia, coronary artery disease, DJD, status post right femoral  fracture on March 27, 2006, treated with a closed reduction and  percutaneous pinning on April 06, 2006, who presents with a  markedly  elevated blood glucose.  In the emergency department high blood glucose  was 537, BUN 30, creatinine 1.4.  The pH was 7.34, pCO2 50, blood  acetone was negative.  The patient was admitted for further evaluation,  investigation and management.   HOSPITAL COURSE:  #1 - SEVERE UNCONTROLLED TYPE 2 DIABETES MELLITUS:  Fortunately no clinical evidence of DKA.  The patient was managed with  intravenous fluid hydration, and iv insulin per Glucommander protocol.  The patient responded to the above treatment measures and was  subsequently successfully transitioned to scheduled Lantus insulin.  She  had been on Glucophage prior to admission; however, given her advanced  age and propensity to lactic acidosis, this has been discontinued.   #2 - DEHYDRATION/ACUTE RENAL FAILURE:  As mentioned, the patient's BUN  on admission was 30 with a creatinine of 1.4.  The patient was managed  with aggressive intravenous fluid hydration, and we were pleased to note  that as of May 27, 2006, the BUN had normalized to 15 with a creatinine  of 1.10,  patient was clinically well-hydrated and intravenous fluids  were therefore discontinued, accordingly.   #3 - HYPOTHYROIDISM:  The patient is a known case of hypothyroidism, and  at the time of admission was on 175 mcg of Synthroid p.o. daily.  However, the TSH done during this admission was 0.514.  The Synthroid  dose has therefore being reduced to 150 mcg p.o. daily.  We expect the  patient's primary MD to continue to monitor her TSH levels, and adjust  medications as indicated.   #4 - FUNGAL/ENTEROCOCCAL URINARY TRACT INFECTION:  At the time of  presentation the patient was noted to have a positive urinary sediment.  Subsequent urine culture demonstrated over 100,000 colonies of yeast.  She has been treated for this, with a course of Diflucan.  In addition,  Enterococcus species was demonstrated and this has been adequately  addressed with a course of  Levaquin, to which sensitivity was shown.   #5 - HYPERTENSION:  The patient remained normotensive throughout her  hospitalization on a combination of Amlodipine and Toprol XL.  Being  diabetic, she will likely benefit from ACE inhibitor; however, we shall  defer this to the patient's primary MD.   #6 - RIGHT FEMORAL FRACTURE:  The patient has been seen by the  orthopedic surgeon, i.e. Dr. Francena Hanly.  She is scheduled to have the  removal of the pin on Jun 02, 2006, and follow up thereafter with the  orthopedic surgeon.   DISPOSITION:  The patient is considered clinically stable for discharge  within the next few days, i.e. following the removal of her orthopedic  hardware.  The details will be elucidated in an addendum at the  appropriate time, by the discharging physician.      Isidor Holts, M.D.  Electronically Signed     CO/MEDQ  D:  05/31/2006  T:  05/31/2006  Job:  [id number]   cc:   Merlene Laughter. Renae Gloss, M.D.  Fax: [phone]

## 2010-06-12 NOTE — Discharge Summary (Signed)
[name], [name] NO.:  [id number]   MEDICAL RECORD NO.:  [id number]          PATIENT TYPE:  INP   LOCATION:  5028                         FACILITY:  [facility]   PHYSICIAN:  Lonia Blood, M.D.DATE OF BIRTH:  07/03/1919   DATE OF ADMISSION:  04/13/2006  DATE OF DISCHARGE:  04/18/2006                               DISCHARGE SUMMARY   PRIMARY CARE PHYSICIAN:  Cala Bradford R. Renae Gloss, M.D.   DISCHARGE DIAGNOSES:  1. Severe dehydration, resolved.  2. Acute renal failure.      a.     Prerenal.      b.     Resolved with intravenous fluid administration.  3. Hypothyroidism.      a.     Thyroid stimulating hormone 24 this hospitalization.      b.     Synthroid dose increased.  4. Deconditioning secondary to above - nursing home placement.  5. Hypertension.  6. Right humeral fracture.      a.     Closed reduction and percutaneous pinning on April 15, 2006.      b.     Sling immobilization x6-8 weeks but may dangle twice a day       for hygiene.      c.     Follow up with Dr. Francena Hanly in two to three weeks.  7. Uncontrolled diabetes - medication titration ongoing.  8. Coronary artery disease.  9. Degenerative joint disease.  10.Hyperlipidemia.  11.Status post L4-L5 lumbar decompression.  12.Status post total abdominal hysterectomy/bilateral salpingo-      oophorectomy.   DISCHARGE MEDICATIONS:  1. Protonix 40 mg p.o. daily.  2. Senokot one tablet p.o. q.h.s.  3. Vytorin one tablet at 1800.  4. Cymbalta 30 mg p.o. daily.  5. Toprol-XL 100 mg p.o. daily.  6. Synthroid 175 mcg p.o. daily.  7. Lantus insulin 12 units b.i.d.  8. Percocet 5/325 one to two tablets p.o. q.4h. p.r.n. pain.  9. Norvasc 5 mg p.o. daily.  10.Glucophage 500 mg p.o. b.i.d.  11.Aspirin 81 mg p.o. daily.   FOLLOW UP:  Ongoing care will be provided by the attending of record at  the patient's nursing home of choice.  The patient's diabetes should be  followed very closely, as it is  highly likely that further titration of  her medication regimen will be required.  TSH should be reevaluated in  six to eight weeks to assure that her increased dose of Synthroid is  sufficient.  The patient should follow up with Dr. Francena Hanly from an  orthopedic standpoint in the timeframe discussed above.   PROCEDURE:  Closed reduction and percutaneous pinning of a two-part  right proximal humeral fracture on April 15, 2006.   CONSULTATIONS:  Vania Rea. Supple, M.D. with orthopedic surgery.   HOSPITAL COURSE:  [name] is a very pleasant 77 year old  female who was previously residing independently in the [place] area.  She presented to Dr. Currie Paris office on April 13, 2006 for  evaluation.  The patient had a history of having fallen on April 08, 2006 at  which time she suffered a right humeral fracture.  Since that  time, she had been doing poorly home.  Because of her decreased mobility  and decreased appetite, the patient had become severely dehydrated.  With this, she was suffering with a mild prerenal azotemia.  As a  result, the patient was directly admitted to [facility].  With IV fluid  administration, the patient's dehydration resolved.  Her appetite  improved.  Her acute renal insufficiency resolved.   During her hospital stay, severely uncontrolled diabetes was also  appreciated.  Diabetic medications were titrated during her hospital  stay.  An IV insulin drip via the Glucommander protocol was necessary in  the perioperative period to assure that her blood sugars were well-  controlled.  At the time of discharge, Glucophage is being resumed.  This was held during her hospital stay.  Otherwise, Lantus insulin needs  to be continued at the same dose administered during her hospital stay.  At the time of discharge, CBGs are consistently less than 200 but are  not yet at goal.  Further titration in the outpatient setting will be  required.   A TSH was  obtained at the time of the patient's admission.  This  returned markedly elevated at 24.  This suggested that the patient's  present Synthroid dose was inadequate.  Her Synthroid dose was therefore  increased.  Recheck of TSH is recommended in six to eight weeks.   It was decided that inpatient evaluation of the patient's humeral  fracture would be appropriate in order to maximize the patient's time  here in the hospital.  Dr. Francena Hanly was consulted for evaluation.  After medical clearance, Dr. Rennis Chris took the patient to the operating  room and was able to correct the patient's fracture via the procedure  detailed above.  Postoperative course in regard to rehabilitation and  treatment was managed by the orthopedic team.   By April 18, 2006, the patient was cleared medically for discharge from  the hospital.  She was not yet, however, felt to be stable for discharge  to live independently.  She did in fact desire transfer to a skilled  facility where she would have more assistance with her activities of  daily living.  At the present time, case management is being consulted  to arrange for this transfer.  At such time that arrangements can be  made, the patient is cleared medically for discharge.      Lonia Blood, M.D.  Electronically Signed     JTM/MEDQ  D:  04/18/2006  T:  04/18/2006  Job:  [id number]   cc:   Merlene Laughter. Renae Gloss, M.D.

## 2010-06-12 NOTE — Discharge Summary (Signed)
[name], [name]               ACCOUNT NO.:  [id number]   MEDICAL RECORD NO.:  [id number]          PATIENT TYPE:  INP   LOCATION:  1443                         [facility]ACILITY:  [facility]   PHYSICIAN:  Isidor Holts, M.D.  DATE OF BIRTH:  Aug 27, 1919   DATE OF ADMISSION:  05/27/2006  DATE OF DISCHARGE:                               DISCHARGE SUMMARY   DATE OF DISCHARGE:  To be determined.   DISCHARGE DIAGNOSES:  1. All controlled type 2 diabetes mellitus.  2. Dehydration and acute renal failure.  3. Fungal urinary tract infection.  4. Hypothyroidism.  5. Hypertension.  6. Right humeral fracture March 27, 2006, status post      reduction/percutaneous pinning of displaced right two part proximal      humeral fracture.  7. Dyslipidemia.  8. Coronary artery disease.   DISCHARGE MEDICATIONS:  1. Aspirin, enteric-coated 81 mg p.o. daily.  2. Protonix 40 mg p.o. daily.  3. Senokot-S, one p.o. q.h.s.  4. Amlodipine 5 mg p.o. daily.  5. Vytorin (10/20) one p.o. q.h.s.  6. Cymbalta 30 mg p.o. daily.  7. Toprol XL 100 mg p.o. daily.  8. Multivitamin 1 p.o. daily.  9. Synthroid 150 mcg p.o. daily (was 175 mcg p.o. daily).  10.Lantus insulin 23 units subcutaneously q.12 hourly.  This      medication list may of course be updated at the time of actual      discharge by discharging M.D.   PROCEDURES:  Portable chest x-ray dated May 27, 2006.  This showed no  active lung disease.  There was fixation of right humeral neck fracture.   CONSULTATIONS:  Dr. Francena Hanly, orthopedic surgeon.   ADMISSION HISTORY:  As in H&P note of May 27, 2006, dictated by Dr.  Jetty Duhamel.  However, in brief, this is a 76 year old female with  known history of hypothyroidism, hypertension, type 2 diabetes mellitus,  coronary artery disease, degenerative joint disease, dyslipidemia,  status post right humeral fracture March 27, 2006, status post  closed reduction with percutaneous pinning March 12/2006, 2008,  who  presents with markedly elevated glycemia of the order of   Dictation Ended At This Point.      Isidor Holts, M.D.     CO/MEDQ  D:  05/31/2006  T:  05/31/2006  Job:  [id number]

## 2010-06-12 NOTE — Op Note (Signed)
[name], [name]                         ACCOUNT NO.:  [id number]   MEDICAL RECORD NO.:  [id number]                   PATIENT TYPE:  INP   LOCATION:  3172                                 FACILITY:  [facility]   PHYSICIAN:  Stefani Dama, M.D.               DATE OF BIRTH:  07/14/1919   DATE OF PROCEDURE:  07/31/2002  DATE OF DISCHARGE:                                 OPERATIVE REPORT   PREOPERATIVE DIAGNOSIS:  L4-5 bilateral spondylosis and stenosis with  herniated nucleus pulposus, lumbar radiculopathy at L4-L5.   POSTOPERATIVE DIAGNOSIS:  L4-5 bilateral spondylosis and stenosis with  herniated nucleus pulposus, lumbar radiculopathy.   PROCEDURE:  Bilateral laminotomies and foraminotomies, microdiskectomy L4-5  with operating microscope and microdissection technique.   SURGEON:  Stefani Dama, M.D.   FIRST ASSISTANT:  Hilda Lias, M.D.   ANESTHESIA:  General endotracheal.   INDICATIONS:  The patient is an 77 year old individual who has had  significant back and bilateral leg pain with weakness that has been getting  worse since April.  She was previously noted to have spondylitic stenosis at  L4-5.  Now she has an acute disk herniation on top of this process causing  severe neuropathy.  She has been advised regarding surgical decompression.   DESCRIPTION OF PROCEDURE:  The patient was brought to the operating room  supine on the stretcher.  After the smooth induction of general endotracheal  anesthesia, she was placed on the table in the prone position.  The back was  shaved and prepped with DuraPrep and draped in a sterile fashion.  The bony  prominences were appropriately padded and protected.  Midline incision was  created in the lower lumbar spine and this was then dissected free to the  lumbar dorsal fascia.  The needle was placed in the interspace at the L4-L5  level which was confirmed by radiology.  The intralaminar space at L4-5 was  then created in the  subperiosteal fashion using dissectors and a self  retaining retractor was then placed in the wound.  Laminotomy was then  carried out, first on the right side at L4-5, removing the inferior margin  of the lamina of L4 out to the mesial wall of the facet.  Partial  fasciectomy was also performed.  This allowed for good decompression of the  central and spinal canal, removing large hypertrophic interspinous  ligamentous tissue of the yellow ligament and overgrowth of the facet.  The  common dural tube and the L5 nerve root  was identified.  The nerve root was  noted to be bowed dorsally over a significant mass.  This mass was found to  be the subligamentous disk herniation in this area.  Attention was then  turned to the opposite side where a similar laminotomy was created and the  common dural tube and the L5 nerve root on that side was similarly dissected  free and  protected again by retraction medially.  This central  subligamentous mass was again identified.  The microscope was draped and  brought into the field and then using a microdissection technique, the mass  was incised with a 15 blade on this lateral border and through this several  fragments of disk were retrieved from the subligamentous space for better  decompression of the central dural tube.  Attention was then turned to the  area of the disk space.  This was first opened on the right side.  It was  noted to be full of loose disk material partially in and partially out of  the disk space.  The space was cleared, and the disk space was entered and a  significant quantity of degenerated disk material including end-plate  material was removed from within the disk space.  After it was completed on  the right side, attention was turned to the left side where a similar  procedure was carried out.  Again, here the disk space was noted to be  somewhat loosened but a significant quantity of disk material severely  degenerating  including the endplates was removed.  In the end, care was  taken to protect the common dural tube and the L5 nerve root to make sure  that it was well decompressed.  Hemostasis in the epidural veins was  achieved with bipolar cautery.  Once the disk space was completely cleared,  care was taken to make sure that the L4 nerve root superiorly and the L5  nerve roots inferiorly were well decompressed.  The common dural tube was  decompressed centrally both above L4 and below L5 and once this was secured,  the microscope was removed from the field.  The area was copiously irrigated  with antibiotics irrigating solution.  Then the lumbar dorsal fascia was  closed carefully on either side by using #1 Vicryl suture; 2-0 Vicryl was  used in the subcutaneous tissue and 3-0 Vicryl subcuticular.  The patient  tolerated the procedure well and was returned to the recovery room in stable  condition.  Blood loss was estimated about 100 mL.                                               Stefani Dama, M.D.    Merla Riches  D:  07/31/2002  T:  07/31/2002  Job:  [id number]

## 2010-06-12 NOTE — Discharge Summary (Signed)
[name], [name]                         ACCOUNT NO.:  [id number]   MEDICAL RECORD NO.:  [id number]                   PATIENT TYPE:  INP   LOCATION:  3028                                 FACILITY:  [facility]   PHYSICIAN:  Stefani Dama, M.D.               DATE OF BIRTH:  11/10/1919   DATE OF ADMISSION:  07/31/2002  DATE OF DISCHARGE:  08/10/2002                                 DISCHARGE SUMMARY   ADMISSION DIAGNOSES:  1. Lumbar spondylosis and stenosis with lumbar radiculopathy.  2. Herniated nucleus pulposus.   DISCHARGE/FINAL DIAGNOSES:  1. Lumbar spondylosis and stenosis with lumbar radiculopathy.  2. Herniated nucleus pulposus.  3. Diabetes mellitus.  4. Intractable right lumbar radiculopathy with weakness of right lower     extremity.   HOSPITAL COURSE:  [name] is an 77 year old individual with advanced  diabetes and severe spondylosis and stenosis with lumbar radiculopathy and  weakness in her right lower extremity.  She was evaluated as an outpatient,  and advised that she would required surgical decompression of L4-5, where  she had an acute disk herniation on top of a very tight stenosis.  This was  performed on July 31, 2002.  Postoperatively, though she felt somewhat  improved, she continued to complain of severe right lower extremity pain in  addition to back pain.  She was treated with medications which made her very  unresponsive and difficult to arouse.  These included Neurontin.  The dose  of Neurontin was stopped initially.  Her pain returned, and then she was  started on smaller doses.  She also required a fair amount of narcotic  analgesic during this time.  Diabetes was also difficult to control, and  with the help of Dr. Kellie Shropshire from internal medicine, she was able to  achieve some better control of her diabetic medications.  At the current  time, she is finally ambulating independently, she is tolerating oral pain  medications in the form of  Percocet one or two q.6h. p.r.n. pain. She has  had resumption of her bowel movements, as she had severe constipation for  nearly a week's period of time.  She has further had good control of her  diabetes on her standard medications, and she is receiving some help of  control of her pain with Neurontin 100 mg b.i.d.  She is discharged home on  these medications.  She will be seen in the office in two weeks' time for  further followup.  Home health PT will be ordered for her.                                               Stefani Dama, M.D.    Merla Riches  D:  08/09/2002  T:  08/11/2002  Job:  [id number]  

## 2010-06-12 NOTE — Op Note (Signed)
NAMECHANNELL, QUATTRONE NO.:  [id number]   MEDICAL RECORD NO.:  [id number]          PATIENT TYPE:  INP   LOCATION:  5150                         FACILITY:  [facility]   PHYSICIAN:  Vania Rea. Supple, M.D.  DATE OF BIRTH:  07-25-1919   DATE OF PROCEDURE:  04/15/2006  DATE OF DISCHARGE:                               OPERATIVE REPORT   PREOPERATIVE DIAGNOSIS:  Displaced right two part proximal humerus  fracture.   POSTOPERATIVE DIAGNOSIS:  Displaced right two part proximal humerus  fracture.   PROCEDURE:  Closed reduction and percutaneous pinning of displaced right  two part proximal humerus fracture.   SURGEON:  Vania Rea. Supple, M.D.   Threasa HeadsFrench Ana A. Shuford, P.A.-C.   ANESTHESIA:  General endotracheal.   ESTIMATED BLOOD LOSS:  Minimal.   DRAINS:  None.   HISTORY:  Ms. Babineau is an 77 year old female who fell back on March 14  sustaining a displaced right proximal humerus fracture.  She had been  evaluated in a local emergency room, placed in a sling, and was  scheduled to follow up with a local orthopedist.  However, upon  returning home, she was unable to care for herself and subsequently  developed diabetes out of control and failure to thrive.  She was  admitted by the medicine service two days ago for stabilization of her  chronic medical problems including diabetes.  Consult to the orthopedic  service was made.  Ms. Hedgepeth x-rays were reviewed which confirm a  displaced two part proximal humerus fracture.  Examination shows diffuse  swelling and ecchymosis of the right upper extremity.  She is grossly  neurovascularly intact.  Due to the degree of displacement, it was felt  that she would benefit from attempted closed reduction versus open  reduction and internal fixation.   Preoperatively, we counseled Ms. Frisinger as well as her daughter, Annice Pih, regarding treatment options and risks versus benefits thereof.  Possible surgical complications  of bleeding, infection, neurovascular  injury, malunion, nonunion, loss of fixation, and possible need for  additional surgery are reviewed.  She understands and accepts and agrees  with our planned procedure.   PROCEDURE IN DETAIL:  After undergoing routine preop evaluation, the  patient received prophylactic antibiotics.  She was placed supine on the  operating table and underwent the smooth induction of a general  endotracheal anesthesia.  Under fluoroscopic guidance, a manipulation of  the right shoulder was performed which showed moderate instability of  the fracture site but we were able to obtain a good reduction.  The  right upper extremity and right shoulder girdle was then sterilely  prepped and draped in a standard fashion.  Once again, using manual  manipulation techniques, the proximal humeral fracture was reduced using  fluoroscopic images to confirm proper alignment.  We then placed a  series of four threaded tipped guide pins across the fracture site,  beginning distally on the humeral shaft and then extending proximally up  into the humeral head.  Overall good alignment was achieved and the  hardware was confirmed to be in good position.  We then maneuvered the  arm and the humeral head and shaft moved in conjunction and all hardware  was found to be in good position after using live fluoro to confirm  proper positioning.  At this point, all pins were clipped beneath the  skin and the skin was then allowed to release and relax over the tips of  the pins.  Steri-Strips and a bulky dry dressing was then placed over  the right shoulder and the right arm was placed in a sling immobilizer.  At this point, the patient was then extubated and taken to the recovery  room in stable condition.      Vania Rea. Supple, M.D.  Electronically Signed     KMS/MEDQ  D:  04/15/2006  T:  04/15/2006  Job:  [id number]

## 2010-06-12 NOTE — Op Note (Signed)
[name], SIGMAN               ACCOUNT NO.:  [id number]   MEDICAL RECORD NO.:  [id number]          PATIENT TYPE:  INP   LOCATION:  1443                         FACILITY:  [facility]   PHYSICIAN:  Vania Rea. Supple, M.D.  DATE OF BIRTH:  [birth year]   DATE OF PROCEDURE:  06/02/2006  DATE OF DISCHARGE:                               OPERATIVE REPORT   PREOPERATIVE DIAGNOSIS:  Retained hardware right proximal humerus.   POSTOPERATIVE DIAGNOSIS:  Retained hardware right proximal humerus.   PROCEDURE:  Hardware removal right proximal humerus constituting four  threaded tipped metal pins used in a previous pinning of a displaced  right proximal humerus fracture.   SURGEON:  Vania Rea. Supple, M.D.   ASSISTANTFrench Ana Shuford PA-C.   ANESTHESIA:  LMA general.   ESTIMATED BLOOD LOSS:  Minimal.   DRAINS:  None.   HISTORY:  [name] is an 76 year old female who sustained a right a  displaced right proximal humerus fracture and underwent a closed  reduction and percutaneous pinning almost seven weeks ago.  We had  planned to remove the pins one to two months postop.  She subsequently  was readmitted to the hospital with poorly controlled diabetes and  dehydration.  We were reconsulted and obtained repeat x-rays as an  inpatient.  This did show good consolidation at the fracture site and  overall good alignment.  Pins are ready for removal.  She is brought to  the operating room at this time for planned pin removal of the right  proximal humerus.   Preoperatively counseled [name] on treatment options as well as risks  versus benefits thereof.  Possible complications of bleeding, infection,  loss fixation, possible need for additional surgery were reviewed.  She  understands and accepts and agrees with the planned procedure.   DESCRIPTION OF PROCEDURE:  After undergoing routine preop evaluation,  the patient was brought to the operating room on her hospital bed and  underwent smooth  induction of an LMA general anesthesia.  The right  upper extremity was sterilely prepped and draped in standard fashion.  She did receive prophylactic antibiotics.  A total of four pins had been  placed and there were all palpable in the subcutaneous tissues.  We  reopened the previous stab wound incision approximately 2 cm in length  with a 15 blade.  Dissection then carried out bluntly with hemostats  down to the pins.  The first three pins were delivered through this  incision and removed without difficulty.  The fourth pin was anterior,  we made a separate stab wound anteriorly over the tip of the pin and  this was then removed  without difficulty.  The pin sites were then closed with 3-0 Monocryl  and a Steri-Strip.  Local anesthetic, 0.50% Marcaine with epinephrine  was instilled into the peri-incisional soft tissues.  The wounds were  then dressed.  The patient was then extubated and taken to the recovery  room in stable condition.      Vania Rea. Supple, M.D.  Electronically Signed     KMS/MEDQ  D:  06/02/2006  T:  06/02/2006  Job:  [id number]

## 2010-06-12 NOTE — H&P (Signed)
NAMEPEJA, ALLENDER NO.:  [id number]   MEDICAL RECORD NO.:  [id number]          PATIENT TYPE:  INP   LOCATION:  1829                         FACILITY:  [facility]   PHYSICIAN:  Lonia Blood, M.D.DATE OF BIRTH:  06-22-1919   DATE OF ADMISSION:  05/27/2006  DATE OF DISCHARGE:                              HISTORY & PHYSICAL   PRIMARY CARE PHYSICIAN:  Dr. Kellie Shropshire.   CHIEF COMPLAINT:  Hyperglycemia.   HISTORY OF PRESENT ILLNESS:  [name] is a very pleasant 77-  year-old female who lives independently.  She was admitted to the  hospital in late March, and had a prolonged hospitalization for  dehydration, acute renal failure and a right humeral fracture which  required surgical pinning.  She was discharged initially to a skilled  nursing facility because of deconditioned status.  She discharged  herself from the skilled nursing facility as she was unhappy with the  conditions there.  At home, her blood sugars have proven too erratic to  control.  Today, she is feeling weak in general, and had a poor  appetite.  When the home health nurse presented to evaluate her, a CBG  of greater than 600 was noted.  The patient was, therefore, sent via EMS  to the ER for evaluation where severe hyperglycemia was confirmed.  In  the emergency room, the patient is seen resting comfortably on the  hospital stretcher in an ER room.  She has no specific complaints at the  present time other than generalized weakness.  She specifically denies  chest pain, nausea, vomiting, fevers or chills.  She does state that she  has had some dysuria over the past 2-3 days.  There has been no melena  or hematochezia.  There has been no hematemesis.  There has been no  headache, dizziness or recurrent falls.  She feels that she is doing  well at home with home health assistance, although she does admit that  she has not much luck controlling her blood sugars.   REVIEW OF SYSTEMS:  A  comprehensive review of systems is accomplished,  and is negative with the exception of the positive elements noted in the  history of present illness above.   PAST MEDICAL HISTORY:  1. Hypothyroidism with adjustment of Synthroid dose in March of 2008.  2. Admission to [facility] in March of 2008 for severe dehydration and      acute renal failure and right humeral fracture.  3. Hypertension.  4. Right humeral fracture status post closed reduction with      percutaneous pinning on April 15, 2006 by Dr. Francena Hanly.  5. Uncontrolled diabetes mellitus.  6. Coronary artery disease.  7. Degenerative joint disease status post L4-L5 decompression by  Dr. Barnett Abu.  1. Hyperlipidemia.  2. Status post total abdominal hysterectomy/bilateral  salpingo-oophorectomy.   CURRENT MEDICATIONS:  1. Norvasc 5 mg daily.  2. Protonix 40 mg daily.  3. Glucophage 500 mg b.i.d.  4. Senokot q.h.s.  5. Aspirin 81 mg daily.  6. Vytorin daily.  7. Cymbalta 30 mg daily.  8. Toprol XL 100 mg daily.  9. Synthroid 175 mcg daily.  10.Lantus insulin 12 units b.i.d.  11.Percocet p.r.n.   ALLERGIES:  MORPHINE.   FAMILY HISTORY:  Family history is reviewed with the patient, but is not  significant to the admission history.   SOCIAL HISTORY:  The patient does not smoke.  She does not drink.  She  is presently living independently though she was referred to a skilled  nursing facility after her recent discharge.   DATA REVIEWED:  Sodium is low at 131; potassium is high at 5.3.  Chloride and bicarb are normal.  BUN is elevated at 30; creatinine is  elevated at 1.4.  Serum glucose is elevated at 537.  Calcium is normal.  White count is elevated at 11,000.  Hemoglobin is normal.  Platelet  count is normal.  MCV is normal.  The pH is 7.34; PCO2 is 50.  Blood  acetone is negative.  Chest x-ray reveals no acute disease.  Urinalysis  reveals 3-6 white blood cells, many yeast, moderate leukocyte esterase,   and greater than 1000 of glucose.   PHYSICAL EXAMINATION:  VITAL SIGNS:  Temperature 97.8, blood pressure  148/75, heart rate 97, respiratory rate 24, O2 sat 94% on room air.  GENERAL:  Well-developed, well-nourished though frail-appearing female  in no acute respiratory distress.  HEENT:  Normocephalic, atraumatic.  Pupils are equal, round and reactive  to light and accommodation.  Extraocular muscles intact bilaterally.  __________  clear.  NECK:  No JVD.  No lymphadenopathy.  LUNGS:  Clear to auscultation bilaterally without wheezes or rhonchi.  CARDIOVASCULAR:  Regular rate and rhythm without murmur, gallop or rub  with normal S1 and S2.  ABDOMEN:  Nontender, nondistended, soft.  Bowel sounds present.  No  hepatosplenomegaly.  No rebound.  No ascites.  There is some tenderness  to deep palpation in the suprapubic region.  EXTREMITIES:  No significant cyanosis, clubbing or edema in bilateral  lower extremities.  NEUROLOGIC:  Patient displays +4/5 strength in bilateral upper and lower  extremities.  She has intact sensation and touch throughout.  There is  no Babinski's.  Cranial nerves 2-12 are intact bilaterally, but the  right upper extremity is not fully tested due to the patient's sling  related to her recent orthopedic surgery.   IMPRESSION AND PLAN:  1. Severe uncontrolled diabetes mellitus - the patient is      significantly dehydrated as a result of her severe dehydration.      There is no evidence of DKA at the present time.  She will be      hydrated aggressively.  We will place her under Glucommander      protocol, and follow her in the stepdown unit until her CBGs are      stabilized.  2. Dehydration - this is most likely secondary to significant polyuria      related to severe hyperglycemia.  We will hydrate the patient, and      follow her renal function. 3. Renal insufficiency - this is essentially a repeat of the patient's      recent hospitalization with a  significant prerenal azotemia.  We      will hydrate her aggressively, and we will follow her renal      function afterward.  4. Pyelonephritis - the patient's urinalysis is, indeed, positive.      Findings are consistent with a fungal pyelonephritis.  We will      place the  patient on Diflucan.  We will check a urine culture.  5. Hypothyroidism - continue Synthroid.  6. Right humeral fracture - we will continue the patient's splint as      detailed at the time of her discharge with her being allowed to      dangle the extremity 2 times per day for hygienic purposes.      Lonia Blood, M.D.  Electronically Signed     JTM/MEDQ  D:  05/27/2006  T:  05/27/2006  Job:  [id number]

## 2010-06-12 NOTE — Discharge Summary (Signed)
[name], [name]               ACCOUNT NO.:  [id number]   MEDICAL RECORD NO.:  [id number]          PATIENT TYPE:  INP   LOCATION:  1443                         [facility]ACILITY:  [facility]   PHYSICIAN:  Hillery Aldo, M.D.   DATE OF BIRTH:  1919-07-13   DATE OF ADMISSION:  05/28/2006  DATE OF DISCHARGE:  06/03/2006                               DISCHARGE SUMMARY   ADDENDUM   PRIMARY CARE PHYSICIAN:  Dr. Andi Devon.   For complete list of the Discharge Diagnoses, Discharge Medications,  Procedures/Diagnostic Studies, Consultations, History of Present  Illness, and Hospital Course through May 31, 2006, please see the  previously dictated Discharge Summary done by Dr. Isidor Holts.   ADDITIONAL DISCHARGE DIAGNOSES:  1. Normocytic anemia postoperatively.  Follow up hemoglobin/hematocrit      recommended in one week.  2. Status post removal of pins to the right humeral fracture on Jun 02, 2006.   CHANGES TO THE DISCHARGE MEDICATIONS LIST:  1. NovoLog 8 units q.a.c. t.i.d. with meals.  2. Lantus insulin 25 units subcutaneously q.12 h.  3. Ampicillin 500 mg q.6 h. x5 days.   ADDITIONAL PROCEDURES AND DIAGNOSTIC STUDIES:  1. Right shoulder film on May 31, 2006 showed a healing surgical neck      fracture on the right, pins in place.  2. Removal of pins by orthopedic surgeon on Jun 01, 2006.   REMAINDER OF HOSPITAL COURSE:  The patient remained medically stable but  somewhat labile blood glucoses.  To address this her meal coverage  insulin was increased, and she was put on a higher dose of Lantus  insulin.  She will need close followup with regard to her blood sugar  control.  Her Lantus and meal coverage can be adjusted as needed to  achieve euglycemia.  The patient did develop a mild normocytic anemia  postoperatively.  Her hemoglobin was 12.1 on May 27, 2006.  We recommend  retesting of her hemoglobin in one week's time to ensure that it is  stable and returning to her  baseline.  She should also maintain a  carbohydrate modified diet.  Additionally she needs to follow up with  Dr. Rennis Chris in six weeks' time.      Hillery Aldo, M.D.  Electronically Signed    CR/MEDQ  D:  06/03/2006  T:  06/03/2006  Job:  [id number]   cc:   Merlene Laughter. Renae Gloss, M.D.  Fax: [phone]

## 2010-06-12 NOTE — Discharge Summary (Signed)
   [name], [name]                         ACCOUNT NO.:  [id number]   MEDICAL RECORD NO.:  [id number]                   PATIENT TYPE:  INP   LOCATION:  3028                                 FACILITY:  [facility]   PHYSICIAN:  Stefani Dama, M.D.               DATE OF BIRTH:  1919/09/03   DATE OF ADMISSION:  07/31/2002  DATE OF DISCHARGE:  08/10/2002                                 DISCHARGE SUMMARY   ADMISSION DIAGNOSES:  1. Lumbar spondylosis with stenosis and herniated nucleus pulposus at L4-5.  2. Lumbar radiculopathy.   SECONDARY DIAGNOSIS:  Intractable back pain.   ADDITIONAL DISCHARGE DIAGNOSIS:  Diabetes mellitus.   HOSPITAL COURSE:  Mrs. [name] is an 77 year old individual who has  had significant back and bilateral leg pain.  She was complaining of severe  weakness in her legs, such that she could not walk but a few feet.  MRI  demonstrated severe spondylitic stenosis with an acute rupture of a disk at  L4-5.  After careful consideration, she was advised regarding surgical  decompression, and this was performed on July 31, 2002, via bilateral  laminotomy and foraminotomy.  Postoperatively, the patient notes that she  had persistent right lower extremity pain.  This was quite severe and  unrelenting.  Because of rather advanced and complicated diabetes  management, we requested the help of internal medicine to help manage with  diabetic complications.  This was done with the help of her primary care  physician.                                               Stefani Dama, M.D.    Merla Riches  D:  08/09/2002  T:  08/11/2002  Job:  [id number]

## 2010-10-16 LAB — CA 125: CA 125: 10.8

## 2010-10-23 LAB — GLUCOSE, CAPILLARY: Glucose-Capillary: 214 — ABNORMAL HIGH

## 2010-11-03 LAB — COMPREHENSIVE METABOLIC PANEL
ALT: 12
ALT: 14
ALT: 16
AST: 14
AST: 26
AST: 30
AST: 39 — ABNORMAL HIGH
Albumin: 2.3 — ABNORMAL LOW
Albumin: 2.3 — ABNORMAL LOW
Alkaline Phosphatase: 56
Alkaline Phosphatase: 65
BUN: 13
CO2: 26
CO2: 27
CO2: 27
Calcium: 7.6 — ABNORMAL LOW
Calcium: 9.2
Chloride: 102
Chloride: 103
Chloride: 104
Creatinine, Ser: 1.25 — ABNORMAL HIGH
GFR calc Af Amer: 45 — ABNORMAL LOW
GFR calc Af Amer: 49 — ABNORMAL LOW
GFR calc Af Amer: 59 — ABNORMAL LOW
GFR calc non Af Amer: 49 — ABNORMAL LOW
GFR calc non Af Amer: 50 — ABNORMAL LOW
Glucose, Bld: 267 — ABNORMAL HIGH
Potassium: 3.6
Potassium: 4.2
Potassium: 4.4
Potassium: 5.8 — ABNORMAL HIGH
Sodium: 135
Sodium: 135
Sodium: 137
Total Bilirubin: 0.3
Total Bilirubin: 0.3
Total Bilirubin: 0.3
Total Protein: 5.1 — ABNORMAL LOW
Total Protein: 6.2
Total Protein: 6.3

## 2010-11-03 LAB — URINALYSIS, ROUTINE W REFLEX MICROSCOPIC
Leukocytes, UA: NEGATIVE
Nitrite: NEGATIVE
Specific Gravity, Urine: 1.018
Urobilinogen, UA: 0.2
pH: 6.5

## 2010-11-03 LAB — BASIC METABOLIC PANEL
BUN: 14
BUN: 19
BUN: 7
BUN: 8
CO2: 25
CO2: 25
Calcium: 10
Calcium: 8.3 — ABNORMAL LOW
Calcium: 8.3 — ABNORMAL LOW
Creatinine, Ser: 1.04
Creatinine, Ser: 1.11
Creatinine, Ser: 1.12
Creatinine, Ser: 1.56 — ABNORMAL HIGH
GFR calc non Af Amer: 32 — ABNORMAL LOW
GFR calc non Af Amer: 47 — ABNORMAL LOW
GFR calc non Af Amer: 49 — ABNORMAL LOW
GFR calc non Af Amer: 50 — ABNORMAL LOW
Glucose, Bld: 166 — ABNORMAL HIGH
Glucose, Bld: 167 — ABNORMAL HIGH
Glucose, Bld: 183 — ABNORMAL HIGH
Glucose, Bld: 196 — ABNORMAL HIGH
Glucose, Bld: 323 — ABNORMAL HIGH
Glucose, Bld: 86
Potassium: 3 — ABNORMAL LOW
Potassium: 3.3 — ABNORMAL LOW
Potassium: 3.4 — ABNORMAL LOW
Potassium: 3.7
Sodium: 134 — ABNORMAL LOW
Sodium: 134 — ABNORMAL LOW

## 2010-11-03 LAB — CBC
HCT: 29.5 — ABNORMAL LOW
HCT: 32 — ABNORMAL LOW
HCT: 32 — ABNORMAL LOW
HCT: 32.1 — ABNORMAL LOW
HCT: 32.3 — ABNORMAL LOW
HCT: 32.9 — ABNORMAL LOW
HCT: 33.6 — ABNORMAL LOW
HCT: 34.9 — ABNORMAL LOW
HCT: 35.1 — ABNORMAL LOW
HCT: 36.9
HCT: 38.5
Hemoglobin: 10.6 — ABNORMAL LOW
Hemoglobin: 10.7 — ABNORMAL LOW
Hemoglobin: 10.9 — ABNORMAL LOW
Hemoglobin: 11 — ABNORMAL LOW
Hemoglobin: 11.3 — ABNORMAL LOW
Hemoglobin: 11.5 — ABNORMAL LOW
Hemoglobin: 11.6 — ABNORMAL LOW
Hemoglobin: 11.8 — ABNORMAL LOW
Hemoglobin: 12.7
MCHC: 32.7
MCHC: 33.2
MCHC: 33.4
MCHC: 33.7
MCHC: 33.7
MCV: 83.5
MCV: 85.5
MCV: 85.5
MCV: 85.7
Platelets: 190
Platelets: 281
Platelets: 287
Platelets: 291
Platelets: 319
Platelets: 320
Platelets: 360
RBC: 3.31 — ABNORMAL LOW
RBC: 3.48 — ABNORMAL LOW
RBC: 3.59 — ABNORMAL LOW
RBC: 3.85 — ABNORMAL LOW
RBC: 4.5
RDW: 13.6
RDW: 13.8
RDW: 13.8
RDW: 13.8
RDW: 13.8
RDW: 13.9
RDW: 14
RDW: 14.1 — ABNORMAL HIGH
RDW: 14.1 — ABNORMAL HIGH
RDW: 14.1 — ABNORMAL HIGH
WBC: 12.5 — ABNORMAL HIGH
WBC: 13.6 — ABNORMAL HIGH
WBC: 13.7 — ABNORMAL HIGH
WBC: 15 — ABNORMAL HIGH
WBC: 15 — ABNORMAL HIGH
WBC: 16.9 — ABNORMAL HIGH
WBC: 17.5 — ABNORMAL HIGH
WBC: 8.6
WBC: 8.7

## 2010-11-03 LAB — URINE MICROSCOPIC-ADD ON

## 2010-11-03 LAB — POCT CARDIAC MARKERS: CKMB, poc: <1 — ABNORMAL LOW

## 2010-11-03 LAB — FECAL LACTOFERRIN, QUANT: Fecal Lactoferrin: POSITIVE

## 2010-11-03 LAB — CLOSTRIDIUM DIFFICILE EIA: C difficile Toxins A+B, EIA: NEGATIVE

## 2010-11-03 LAB — CROSSMATCH: ABO/RH(D): A POS

## 2010-11-03 LAB — ABO/RH: ABO/RH(D): A POS

## 2010-11-03 LAB — CARDIAC PANEL(CRET KIN+CKTOT+MB+TROPI)
CK, MB: 1.8
Relative Index: INVALID
Total CK: 91

## 2010-11-03 LAB — OCCULT BLOOD X 1 CARD TO LAB, STOOL: Fecal Occult Bld: NEGATIVE

## 2010-11-03 LAB — STOOL CULTURE

## 2010-11-03 LAB — PHOSPHORUS: Phosphorus: 4

## 2010-11-03 LAB — CANCER ANTIGEN 19-9: CA 19-9: <1.2 — ABNORMAL LOW (ref <35.0)

## 2010-11-03 LAB — CK TOTAL AND CKMB (NOT AT ARMC)
Relative Index: INVALID
Total CK: 51

## 2010-11-03 LAB — PROTIME-INR: Prothrombin Time: 13.9

## 2010-11-03 LAB — TROPONIN I: Troponin I: 0.02

## 2010-11-03 LAB — APTT: aPTT: 30

## 2011-01-15 ENCOUNTER — Other Ambulatory Visit: Payer: Self-pay | Admitting: Gastroenterology

## 2011-01-15 ENCOUNTER — Encounter (HOSPITAL_COMMUNITY)
Admission: RE | Disposition: A | Discharge status: Home / Self Care | Payer: Self-pay | Source: Ambulatory Visit | Attending: Gastroenterology

## 2011-01-15 ENCOUNTER — Ambulatory Visit (HOSPITAL_COMMUNITY)
Admission: RE | Admit: 2011-01-15 | Discharge: 2011-01-15 | Disposition: A | Discharge status: Home / Self Care | Payer: Medicare Other | Source: Ambulatory Visit | Attending: Gastroenterology | Admitting: Gastroenterology

## 2011-01-15 ENCOUNTER — Encounter (HOSPITAL_COMMUNITY): Payer: Self-pay | Admitting: *Deleted

## 2011-01-15 DIAGNOSIS — R159 Full incontinence of feces: Secondary | ICD-10-CM | POA: Insufficient documentation

## 2011-01-15 DIAGNOSIS — I739 Peripheral vascular disease, unspecified: Secondary | ICD-10-CM | POA: Insufficient documentation

## 2011-01-15 DIAGNOSIS — E119 Type 2 diabetes mellitus without complications: Secondary | ICD-10-CM | POA: Insufficient documentation

## 2011-01-15 DIAGNOSIS — R197 Diarrhea, unspecified: Secondary | ICD-10-CM | POA: Insufficient documentation

## 2011-01-15 DIAGNOSIS — Z794 Long term (current) use of insulin: Secondary | ICD-10-CM | POA: Insufficient documentation

## 2011-01-15 DIAGNOSIS — K644 Residual hemorrhoidal skin tags: Secondary | ICD-10-CM | POA: Insufficient documentation

## 2011-01-15 DIAGNOSIS — K648 Other hemorrhoids: Secondary | ICD-10-CM | POA: Insufficient documentation

## 2011-01-15 DIAGNOSIS — K5730 Diverticulosis of large intestine without perforation or abscess without bleeding: Secondary | ICD-10-CM | POA: Insufficient documentation

## 2011-01-15 DIAGNOSIS — Z79899 Other long term (current) drug therapy: Secondary | ICD-10-CM | POA: Insufficient documentation

## 2011-01-15 DIAGNOSIS — I10 Essential (primary) hypertension: Secondary | ICD-10-CM | POA: Insufficient documentation

## 2011-01-15 DIAGNOSIS — I252 Old myocardial infarction: Secondary | ICD-10-CM | POA: Insufficient documentation

## 2011-01-15 DIAGNOSIS — E039 Hypothyroidism, unspecified: Secondary | ICD-10-CM | POA: Insufficient documentation

## 2011-01-15 DIAGNOSIS — I2510 Atherosclerotic heart disease of native coronary artery without angina pectoris: Secondary | ICD-10-CM | POA: Insufficient documentation

## 2011-01-15 DIAGNOSIS — I499 Cardiac arrhythmia, unspecified: Secondary | ICD-10-CM | POA: Insufficient documentation

## 2011-01-15 DIAGNOSIS — D371 Neoplasm of uncertain behavior of stomach: Secondary | ICD-10-CM | POA: Insufficient documentation

## 2011-01-15 HISTORY — DX: Peripheral vascular disease, unspecified: I73.9

## 2011-01-15 HISTORY — DX: Cardiac arrhythmia, unspecified: I49.9

## 2011-01-15 HISTORY — DX: Hypothyroidism, unspecified: E03.9

## 2011-01-15 HISTORY — DX: Essential (primary) hypertension: I10

## 2011-01-15 HISTORY — DX: Acute myocardial infarction, unspecified: I21.9

## 2011-01-15 HISTORY — DX: Atherosclerotic heart disease of native coronary artery without angina pectoris: I25.10

## 2011-01-15 HISTORY — PX: COLONOSCOPY: SHX5424

## 2011-01-15 LAB — GLUCOSE, CAPILLARY: Glucose-Capillary: 129 mg/dL — ABNORMAL HIGH (ref 70–99)

## 2011-01-15 SURGERY — COLONOSCOPY
Anesthesia: Moderate Sedation

## 2011-01-15 MED ORDER — SODIUM CHLORIDE 0.9 % IV SOLN
INTRAVENOUS | Status: DC
  Administered 2011-01-15: 500 mL via INTRAVENOUS

## 2011-01-15 MED ORDER — FENTANYL CITRATE 0.05 MG/ML IJ SOLN
INTRAMUSCULAR | Status: AC
  Filled 2011-01-15: qty 2

## 2011-01-15 MED ORDER — MIDAZOLAM HCL 10 MG/2ML IJ SOLN
INTRAMUSCULAR | Status: AC
  Filled 2011-01-15: qty 2

## 2011-01-15 MED ORDER — FENTANYL NICU IV SYRINGE 50 MCG/ML
INJECTION | INTRAMUSCULAR | Status: DC | PRN
  Administered 2011-01-15 (×4): 12.5 ug via INTRAVENOUS

## 2011-01-15 MED ORDER — MIDAZOLAM HCL 10 MG/2ML IJ SOLN
INTRAMUSCULAR | Status: DC | PRN
  Administered 2011-01-15 (×4): 1 mg via INTRAVENOUS

## 2011-01-15 NOTE — Interval H&P Note (Signed)
History and Physical Interval Note:  01/15/2011 12:05 PM  [name]  has presented today for surgery, with the diagnosis of diarrhea  The various methods of treatment have been discussed with the patient and family. After consideration of risks, benefits and other options for treatment, the patient has consented to  Procedure(s): COLONOSCOPY as a surgical intervention .  The patients' history has been reviewed, patient examined, no change in status, stable for surgery.  I have reviewed the patients' chart and labs.  Questions were answered to the patient's satisfaction.     Valoree Agent D

## 2011-01-15 NOTE — Op Note (Addendum)
[facility] [address] [place], [place]  [address]  OPERATIVE PROCEDURE REPORT  PATIENT:  [name], [name]  MR#:  [id number] BIRTHDATE:  [birth year]  GENDER:  female ENDOSCOPIST:  Jeani Hawking, MD PROCEDURE DATE:  01/15/2011 PROCEDURE:  Colonoscopy with snare polypectomy ASA CLASS:  Class III INDICATIONS: MEDICATIONS:  Fentanyl 50 mcg IV, Versed 4 mg IV  DESCRIPTION OF PROCEDURE:   After the risks benefits and alternatives of the procedure were thoroughly explained, informed consent was obtained.  Digital rectal exam was performed and revealed no abnormalities.   The Pentax Ped Colon [id number] endoscope was introduced through the anus and advanced to the cecum, which was identified by both the appendix and ileocecal valve, without limitations.  The quality of the prep was good.. The instrument was then slowly withdrawn as the colon was fully examined.<<PROCEDUREIMAGES>>  FINDINGS:  Two 3-4 mm sessile polyps were removed with a cold snare. Scattered diverticula were noted throughout the descending and sigmoid colon. The sigmoid colon was very difficult to traverse. No other abnormalities identified. Cold biopsies were obtained throughout the entire colon.   Retroflexed views in the rectum revealed internal and external hemorrhoids.    The scope was then withdrawn from the patient and the procedure terminated.  COMPLICATIONS:  None  IMPRESSION:  1) Sessile polyp 2) Internal and external hemorrhoids 3) Diverticula RECOMMENDATIONS:  1) Await biopsy results.  ______________________________ Jeani Hawking, MD  n. Rosalie DoctorJeani Hawking at 01/15/2011 12:48 PM  [employer], [phone]

## 2011-01-15 NOTE — H&P (Signed)
[name] is an 77 y.o. female.   Chief Complaint: Diarrhea HPI: This is an 77 year-old female with complaints of diarrhea.  She reports having diarrhea since she was 77 years old, but within the past couple of years her symptoms have worsened.  She reports that she cannot hold the stool in and it can leak out of her.  In the past she had two vaginal deliveries and she thinks she had a tear with her son.  No reports of hematochezia or melena and her diarrhea is typically watery.  The only new medications that she tried are the anti-diarrheas, but they have not been of any benefit.  Past Medical History  Diagnosis Date  . Myocardial infarction   . Coronary artery disease   . Hypertension   . Dysrhythmia   . Peripheral vascular disease   . Hypothyroidism   . Diabetes mellitus     Past Surgical History  Procedure Date  . Cholecystectomy     History reviewed. No pertinent family history. Social History:  reports that she has quit smoking. Her smoking use included Cigarettes. She does not have any smokeless tobacco history on file. She reports that she drinks alcohol. She reports that she does not use illicit drugs.  Allergies:  Allergies  Allergen Reactions  . Morphine     Medications Prior to Admission  Medication Dose Route Frequency Provider Last Rate Last Dose  . 0.9 %  sodium chloride infusion   Intravenous Continuous Theda Belfast, MD       Medications Prior to Admission  Medication Sig Dispense Refill  . carvedilol (COREG) 6.25 MG tablet Take 6.25 mg by mouth 2 (two) times daily with a meal.        . diltiazem (CARDIZEM) 120 MG tablet Take 120 mg by mouth daily.        . furosemide (LASIX) 20 MG tablet Take 20 mg by mouth 2 (two) times daily.        Marland Kitchen gabapentin (NEURONTIN) 300 MG capsule Take 300 mg by mouth daily.        . insulin glargine (LANTUS) 100 UNIT/ML injection Inject 42 Units into the skin at bedtime.        . insulin glulisine (APIDRA) 100 UNIT/ML  injection Inject 12 Units into the skin 3 (three) times daily before meals.        . isosorbide mononitrate (IMDUR) 30 MG 24 hr tablet Take 30 mg by mouth daily.        . Tamsulosin HCl (FLOMAX) 0.4 MG CAPS Take 0.4 mg by mouth daily.        . traZODone (DESYREL) 50 MG tablet Take 50 mg by mouth at bedtime.          Results for orders placed during the hospital encounter of 01/15/11 (from the past 48 hour(s))  GLUCOSE, CAPILLARY     Status: Abnormal   Collection Time   01/15/11 11:47 AM      Component Value Range Comment   Glucose-Capillary 129 (*) 70 - 99 (mg/dL)    No results found.  ROS:  As stated above in the HPI, otherwise negative.  General appearance: alert and no distress Resp: clear to auscultation bilaterally GI: soft, non-tender; bowel sounds normal; no masses,  no organomegaly Extremities: extremities normal, atraumatic, no cyanosis or edema Blood pressure 120/59, temperature 97.6 F (36.4 C), temperature source Oral, resp. rate 24, height 5\' 8"  (1.727 m), weight 79.379 kg (175 lb), SpO2 93.00%.  Assessment/Plan 1) Diarrhea 2) Fecal incontinence   I cannot discern if her diarrhea is secondary to an inflammatory process versus fecal incontinence.  She has good resting anal sphincter tone, but her voluntary contraction is poor.  In 2008 and 2009 she had two colonoscopies by Dr. Virginia Rochester.  In 2008 she was identified to have an ischemic colitis.  I will pursue a colonoscopy at this time to see if she has a microscopic colitis.  Her case will be performed in the hospital.  Apparently she had a sudden cardiac death episode, per her report 2-3 years ago, but no problems since that time.  If she is negative for microscopic colitis I will focus treatment on fecal incontinence.  Lesta Limbert D 01/15/2011, 11:59 AM

## 2011-01-15 NOTE — Progress Notes (Signed)
1545 per  Order of Dr Elnoria Howard in&out cath preformed  Approx.  650cc obtained  Unable to palpate bladder. Tolerated very well   Home instructions given and signed by daughter Roswell Nickel.N.

## 2011-01-20 ENCOUNTER — Encounter (HOSPITAL_COMMUNITY): Payer: Self-pay | Admitting: Gastroenterology

## 2011-02-13 ENCOUNTER — Encounter (HOSPITAL_COMMUNITY): Payer: Self-pay | Admitting: Emergency Medicine

## 2011-02-13 ENCOUNTER — Emergency Department (HOSPITAL_COMMUNITY)
Admission: EM | Admit: 2011-02-13 | Discharge: 2011-02-13 | Disposition: A | Discharge status: Home / Self Care | Payer: Medicare Other | Attending: Emergency Medicine | Admitting: Emergency Medicine

## 2011-02-13 DIAGNOSIS — E119 Type 2 diabetes mellitus without complications: Secondary | ICD-10-CM | POA: Insufficient documentation

## 2011-02-13 DIAGNOSIS — R339 Retention of urine, unspecified: Secondary | ICD-10-CM

## 2011-02-13 DIAGNOSIS — N390 Urinary tract infection, site not specified: Secondary | ICD-10-CM

## 2011-02-13 DIAGNOSIS — Z794 Long term (current) use of insulin: Secondary | ICD-10-CM | POA: Insufficient documentation

## 2011-02-13 DIAGNOSIS — I739 Peripheral vascular disease, unspecified: Secondary | ICD-10-CM | POA: Insufficient documentation

## 2011-02-13 DIAGNOSIS — I252 Old myocardial infarction: Secondary | ICD-10-CM | POA: Insufficient documentation

## 2011-02-13 DIAGNOSIS — Z79899 Other long term (current) drug therapy: Secondary | ICD-10-CM | POA: Insufficient documentation

## 2011-02-13 DIAGNOSIS — I2510 Atherosclerotic heart disease of native coronary artery without angina pectoris: Secondary | ICD-10-CM | POA: Insufficient documentation

## 2011-02-13 DIAGNOSIS — I10 Essential (primary) hypertension: Secondary | ICD-10-CM | POA: Insufficient documentation

## 2011-02-13 DIAGNOSIS — E039 Hypothyroidism, unspecified: Secondary | ICD-10-CM | POA: Insufficient documentation

## 2011-02-13 LAB — URINE CULTURE
Colony Count: 50000
Special Requests: NORMAL

## 2011-02-13 LAB — URINE MICROSCOPIC-ADD ON

## 2011-02-13 LAB — URINALYSIS, ROUTINE W REFLEX MICROSCOPIC
Nitrite: NEGATIVE
Protein, ur: 30 mg/dL — AB
Specific Gravity, Urine: 1.01 (ref 1.005–1.030)
Urobilinogen, UA: 0.2 mg/dL (ref 0.0–1.0)

## 2011-02-13 MED ORDER — SULFAMETHOXAZOLE-TMP DS 800-160 MG PO TABS
1.0000 | ORAL_TABLET | Freq: Once | ORAL | Status: AC
  Administered 2011-02-13: 1 via ORAL

## 2011-02-13 MED ORDER — SULFAMETHOXAZOLE-TRIMETHOPRIM 800-160 MG PO TABS: 1.0000 | ORAL_TABLET | Freq: Two times a day (BID) | ORAL | Status: AC

## 2011-02-13 NOTE — ED Notes (Signed)
BMW:[id number]<GM> Expected date:02/13/11<BR> Expected time: 1:37 AM<BR> Means of arrival:Ambulance<BR> Comments:<BR> 89 yro

## 2011-02-13 NOTE — ED Provider Notes (Signed)
History     CSN: [id number]  Arrival date & time 02/13/11  0154   First MD Initiated Contact with Patient 02/13/11 0217      Chief Complaint  Patient presents with  . Urinary Retention    (Consider location/radiation/quality/duration/timing/severity/associated sxs/prior treatment) HPI This is an 78 year old white female with a seven-hour history of urinary retention. She has a history of similar episodes in the past, sometimes associated with urinary tract infection. She states her discomfort was severe when she arrived. A Foley catheter was placed by an EMT per protocol. She put out 1300 mL of urine with significant relief. She denies fever or chills. She denies nausea or vomiting.  Past Medical History  Diagnosis Date  . Myocardial infarction   . Coronary artery disease   . Hypertension   . Dysrhythmia   . Peripheral vascular disease   . Hypothyroidism   . Diabetes mellitus     Past Surgical History  Procedure Date  . Cholecystectomy   . Colonoscopy 01/15/2011    Procedure: COLONOSCOPY;  Surgeon: Theda Belfast, MD;  Location: [facility];  Service: Endoscopy;  Laterality: N/A;    No family history on file.  History  Substance Use Topics  . Smoking status: Former Smoker    Types: Cigarettes  . Smokeless tobacco: Not on file  . Alcohol Use: Yes     social    OB History    Grav Para Term Preterm Abortions TAB SAB Ect Mult Living                  Review of Systems  All other systems reviewed and are negative.    Allergies  Morphine  Home Medications   Current Outpatient Rx  Name Route Sig Dispense Refill  . CARVEDILOL 6.25 MG PO TABS Oral Take 6.25 mg by mouth 2 (two) times daily with a meal.      . DILTIAZEM HCL 120 MG PO TABS Oral Take 120 mg by mouth daily.      . FUROSEMIDE 20 MG PO TABS Oral Take 20 mg by mouth 2 (two) times daily.      Marland Kitchen GABAPENTIN 300 MG PO CAPS Oral Take 300 mg by mouth daily.      . INSULIN GLARGINE 100 UNIT/ML [place] SOLN  Subcutaneous Inject 42 Units into the skin at bedtime.      . INSULIN GLULISINE 100 UNIT/ML [place] SOLN Subcutaneous Inject 12 Units into the skin 3 (three) times daily before meals.      . ISOSORBIDE MONONITRATE ER 30 MG PO TB24 Oral Take 30 mg by mouth daily.      Marland Kitchen TAMSULOSIN HCL 0.4 MG PO CAPS Oral Take 0.4 mg by mouth daily.      . TRAZODONE HCL 50 MG PO TABS Oral Take 50 mg by mouth at bedtime.        BP 159/41  Pulse 58  Temp(Src) 97.7 F (36.5 C) (Oral)  Resp 18  SpO2 96%  Physical Exam General: Well-developed, well-nourished female in no acute distress; appears younger age of record HENT: normocephalic, atraumatic Eyes: pupils equal round and reactive to light; extraocular muscles intact Neck: supple Heart: regular rate and rhythm Lungs: clear to auscultation bilaterally Abdomen: soft; nondistended; nontender; no masses or hepatosplenomegaly; bowel sounds present GU: Foley catheter draining clear yellow urine Extremities: No deformity; full range of motion; pulses normal; +1 pitting edema of lower legs Neurologic: Awake, alert and oriented; motor function intact in all extremities and symmetric;  no facial droop; head of hearing Skin: Warm and dry Psychiatric: Normal mood and affect    ED Course  Procedures (including critical care time)    MDM   Nursing notes and vitals signs, including pulse oximetry, reviewed.  Summary of this visit's results, reviewed by myself:  Labs:  Results for orders placed during the hospital encounter of 02/13/11  URINALYSIS, ROUTINE W REFLEX MICROSCOPIC      Component Value Range   Color, Urine YELLOW  YELLOW    APPearance CLOUDY (*) CLEAR    Specific Gravity, Urine 1.010  1.005 - 1.030    pH 6.5  5.0 - 8.0    Glucose, UA >1000 (*) NEGATIVE (mg/dL)   Hgb urine dipstick NEGATIVE  NEGATIVE    Bilirubin Urine NEGATIVE  NEGATIVE    Ketones, ur NEGATIVE  NEGATIVE (mg/dL)   Protein, ur 30 (*) NEGATIVE (mg/dL)   Urobilinogen, UA 0.2  0.0  - 1.0 (mg/dL)   Nitrite NEGATIVE  NEGATIVE    Leukocytes, UA SMALL (*) NEGATIVE   URINE MICROSCOPIC-ADD ON      Component Value Range   Squamous Epithelial / LPF RARE  RARE    WBC, UA 7-10  <3 (WBC/hpf)   Bacteria, UA RARE  RARE    Casts HYALINE CASTS (*) NEGATIVE             Hanley Seamen, MD 02/13/11 [phone]

## 2011-02-13 NOTE — ED Notes (Signed)
Patient sts that she talked with her daughter and she will pick her up [phone]

## 2011-02-13 NOTE — Discharge Instructions (Signed)
Acute Urinary Retention, Female Urinary retention means you are unable to pee completely or at all (empty your bladder). HOME CARE  Drink enough fluids to keep your pee (urine) clear or pale yellow.   If you are sent home with a tube that drains the bladder (catheter), there will be a drainage bag attached to it.   Keep the drainage bag emptied.   Keep the drainage bag lower than the tube.   Only take medicine as told by your doctor.  GET HELP RIGHT AWAY IF:   You have chills.   You have a temperature by mouth above 102 F (38.9 C), not controlled by medicine.   You feel sick.   You develop belly (abdominal) or back pain.  MAKE SURE YOU:   Understand these instructions.   Will watch your condition.   Will get help right away if you are not doing well or get worse.  Document Released: 06/30/2007 Document Revised: 09/23/2010 Document Reviewed: 12/13/2008 [facility] Patient Information 2012 [place], [place].

## 2011-02-13 NOTE — ED Notes (Signed)
Per EMS patient sts  That she has been unable to void x 7 hrs.  Patient very uncomfortable w/ hx of the same

## 2011-02-13 NOTE — ED Notes (Signed)
Patient daughter unable to come get patient due to a handicapped child. Can come in am.  Patient elects to call daughter in am rather than PTAR

## 2011-02-13 NOTE — ED Notes (Signed)
Instruction on leg bag given to daughter as well as patient

## 2011-02-13 NOTE — ED Notes (Signed)
Family at bedside. 

## 2011-02-13 NOTE — ED Notes (Signed)
Foley inserted by michele edt .  W/o difficulty. To BSD o/p 1500cc cloudy urine.  Dr Laure Kidney in to see.  Patient sts that this happened just a month ago

## 2011-02-14 ENCOUNTER — Encounter (HOSPITAL_COMMUNITY): Payer: Self-pay | Admitting: Emergency Medicine

## 2011-02-16 NOTE — ED Notes (Addendum)
+   urine Chart sent to EDP office for review. Chart returned from EDP office . Chart reviewed by Scarlett Presto and she wants patient to start Ciprofloxacin 500 mg BID x 7 days.

## 2011-02-17 NOTE — ED Notes (Addendum)
Patient wants rx called to [facility] -[phone]. Prescription called in to [facility]. Called in by [facility].

## 2011-04-10 ENCOUNTER — Emergency Department (HOSPITAL_COMMUNITY)
Admission: EM | Admit: 2011-04-10 | Discharge: 2011-04-10 | Disposition: A | Discharge status: Home / Self Care | Payer: Medicare Other | Attending: Emergency Medicine | Admitting: Emergency Medicine

## 2011-04-10 ENCOUNTER — Encounter (HOSPITAL_COMMUNITY): Payer: Self-pay | Admitting: *Deleted

## 2011-04-10 DIAGNOSIS — E039 Hypothyroidism, unspecified: Secondary | ICD-10-CM | POA: Insufficient documentation

## 2011-04-10 DIAGNOSIS — R197 Diarrhea, unspecified: Secondary | ICD-10-CM | POA: Insufficient documentation

## 2011-04-10 DIAGNOSIS — E119 Type 2 diabetes mellitus without complications: Secondary | ICD-10-CM | POA: Insufficient documentation

## 2011-04-10 DIAGNOSIS — I252 Old myocardial infarction: Secondary | ICD-10-CM | POA: Insufficient documentation

## 2011-04-10 DIAGNOSIS — R109 Unspecified abdominal pain: Secondary | ICD-10-CM | POA: Insufficient documentation

## 2011-04-10 DIAGNOSIS — E871 Hypo-osmolality and hyponatremia: Secondary | ICD-10-CM | POA: Insufficient documentation

## 2011-04-10 DIAGNOSIS — I10 Essential (primary) hypertension: Secondary | ICD-10-CM | POA: Insufficient documentation

## 2011-04-10 DIAGNOSIS — I739 Peripheral vascular disease, unspecified: Secondary | ICD-10-CM | POA: Insufficient documentation

## 2011-04-10 DIAGNOSIS — R339 Retention of urine, unspecified: Secondary | ICD-10-CM | POA: Insufficient documentation

## 2011-04-10 DIAGNOSIS — I2510 Atherosclerotic heart disease of native coronary artery without angina pectoris: Secondary | ICD-10-CM | POA: Insufficient documentation

## 2011-04-10 LAB — DIFFERENTIAL
Basophils Absolute: 0 10*3/uL (ref 0.0–0.1)
Eosinophils Absolute: 0.1 10*3/uL (ref 0.0–0.7)
Eosinophils Relative: 1 % (ref 0–5)
Lymphs Abs: 0.9 10*3/uL (ref 0.7–4.0)
Neutrophils Relative %: 86 % — ABNORMAL HIGH (ref 43–77)

## 2011-04-10 LAB — URINALYSIS, ROUTINE W REFLEX MICROSCOPIC
Hgb urine dipstick: NEGATIVE
Leukocytes, UA: NEGATIVE
Nitrite: NEGATIVE
Protein, ur: NEGATIVE mg/dL
Specific Gravity, Urine: 1.013 (ref 1.005–1.030)
Urobilinogen, UA: 0.2 mg/dL (ref 0.0–1.0)

## 2011-04-10 LAB — BASIC METABOLIC PANEL
Calcium: 9.7 mg/dL (ref 8.4–10.5)
GFR calc Af Amer: 32 mL/min — ABNORMAL LOW (ref >90)
GFR calc non Af Amer: 27 mL/min — ABNORMAL LOW (ref >90)
Potassium: 5.1 mEq/L (ref 3.5–5.1)
Sodium: 126 mEq/L — ABNORMAL LOW (ref 135–145)

## 2011-04-10 LAB — CBC
MCH: 29.1 pg (ref 26.0–34.0)
MCV: 85 fL (ref 78.0–100.0)
Platelets: 277 10*3/uL (ref 150–400)
RBC: 4.67 MIL/uL (ref 3.87–5.11)
RDW: 13.2 % (ref 11.5–15.5)
WBC: 11.1 10*3/uL — ABNORMAL HIGH (ref 4.0–10.5)

## 2011-04-10 MED ORDER — SODIUM CHLORIDE 0.9 % IV BOLUS (SEPSIS)
500.0000 mL | Freq: Once | INTRAVENOUS | Status: AC
  Administered 2011-04-10: 500 mL via INTRAVENOUS

## 2011-04-10 NOTE — ED Notes (Signed)
Per EMS- pt in c/o urinary retention since last night, lower abd pain today and diarrhea, history of urinary retention

## 2011-04-10 NOTE — Discharge Instructions (Signed)
Hyponatremia  Hyponatremia is when the amount of salt (sodium) in your blood is too low. When sodium levels are low, your cells will absorb extra water and swell. The swelling happens throughout the body, but it mostly affects the brain. Severe brain swelling (cerebral edema), seizures, or coma can happen.  CAUSES   Heart, kidney, or liver problems.   Thyroid problems.   Adrenal gland problems.   Severe vomiting and diarrhea.   Certain medicines or illegal drugs.   Dehydration.   Drinking too much water.   Low-sodium diet.  SYMPTOMS   Nausea and vomiting.   Confusion.   Lethargy.   Agitation.   Headache.   Twitching or shaking (seizures).   Unconsciousness.   Appetite loss.   Muscle weakness and cramping.  DIAGNOSIS  Hyponatremia is identified by a simple blood test. Your caregiver will perform a history and physical exam to try to find the cause and type of hyponatremia. Other tests may be needed to measure the amount of sodium in your blood and urine. TREATMENT  Treatment will depend on the cause.   Fluids may be given through the vein (IV).   Medicines may be used to correct the sodium imbalance. If medicines are causing the problem, they will need to be adjusted.   Water or fluid intake may be restricted to restore proper balance.  The speed of correcting the sodium problem is very important. If the problem is corrected too fast, nerve damage (sometimes unchangeable) can happen. HOME CARE INSTRUCTIONS   Only take medicines as directed by your caregiver. Many medicines can make hyponatremia worse. Discuss all your medicines with your caregiver.   Carefully follow any recommended diet, including any fluid restrictions.   You may be asked to repeat lab tests. Follow these directions.   Avoid alcohol and recreational drugs.  SEEK MEDICAL CARE IF:   You develop worsening nausea, fatigue, headache, confusion, or weakness.   Your original hyponatremia  symptoms return.   You have problems following the recommended diet.  SEEK IMMEDIATE MEDICAL CARE IF:   You have a seizure.   You faint.   You have ongoing diarrhea or vomiting.  MAKE SURE YOU:   Understand these instructions.   Will watch your condition.   Will get help right away if you are not doing well or get worse.  Document Released: 01/01/2002 Document Revised: 12/31/2010 Document Reviewed: 06/28/2010 [facility] Patient Information 2012 [place], [place].Acute Urinary Retention, Female Urinary retention means you are unable to pee completely or at all (empty your bladder). HOME CARE  Drink enough fluids to keep your pee (urine) clear or pale yellow.   If you are sent home with a tube that drains the bladder (catheter), there will be a drainage bag attached to it.   Keep the drainage bag emptied.   Keep the drainage bag lower than the tube.   Only take medicine as told by your doctor.  GET HELP RIGHT AWAY IF:   You have chills.   You have a temperature by mouth above 102 F (38.9 C), not controlled by medicine.   You feel sick.   You develop belly (abdominal) or back pain.  MAKE SURE YOU:   Understand these instructions.   Will watch your condition.   Will get help right away if you are not doing well or get worse.  Document Released: 06/30/2007 Document Revised: 12/31/2010 Document Reviewed: 12/13/2008 [facility] Patient Information 2012 [place], [place].

## 2011-04-10 NOTE — ED Notes (Signed)
ZOX:[id number]<AV> Expected date:04/10/11<BR> Expected time:12:43 PM<BR> Means of arrival:Ambulance<BR> Comments:<BR> EMS M31 GC -- Unable to Urinate

## 2011-04-10 NOTE — ED Provider Notes (Signed)
History     CSN: [id number]  Arrival date & time 04/10/11  1252   First MD Initiated Contact with Patient 04/10/11 1325      Chief Complaint  Patient presents with  . Urinary Retention   HPI The patient presents to the emergency room with complaints of urinary retention. Patient states she's not been able to urinate since last night. She has been having severe lower abdominal pain associated with that. Patient states she's also been having diarrhea but this is a chronic problem for her. She's had it since she's a teenager. She has had to take paregoric in the past for her frequent diarrhea. She recently saw a gastroenterologist and had a colonoscopy. Patient states he has history of urinary retention as well and she's not sure what causes it. She previously has had Foley catheters and used to see Dr. Vonita Moss from urology. Patient otherwise denies any fevers, although she has had some nausea. The symptoms are getting better after having a Foley catheter placed. Past Medical History  Diagnosis Date  . Myocardial infarction   . Coronary artery disease   . Hypertension   . Dysrhythmia   . Peripheral vascular disease   . Hypothyroidism   . Diabetes mellitus     Past Surgical History  Procedure Date  . Cholecystectomy   . Colonoscopy 01/15/2011    Procedure: COLONOSCOPY;  Surgeon: Theda Belfast, MD;  Location: [facility];  Service: Endoscopy;  Laterality: N/A;    History reviewed. No pertinent family history.  History  Substance Use Topics  . Smoking status: Former Smoker    Types: Cigarettes  . Smokeless tobacco: Not on file  . Alcohol Use: Yes     social    OB History    Grav Para Term Preterm Abortions TAB SAB Ect Mult Living                  Review of Systems  All other systems reviewed and are negative.    Allergies  Morphine  Home Medications   Current Outpatient Rx  Name Route Sig Dispense Refill  . CARVEDILOL 6.25 MG PO TABS Oral Take 6.25 mg by  mouth 2 (two) times daily with a meal.      . DILTIAZEM HCL 120 MG PO TABS Oral Take 120 mg by mouth daily.      . FUROSEMIDE 20 MG PO TABS Oral Take 20 mg by mouth 2 (two) times daily.      Marland Kitchen GABAPENTIN 300 MG PO CAPS Oral Take 300 mg by mouth daily.      . INSULIN GLARGINE 100 UNIT/ML [place] SOLN Subcutaneous Inject 42 Units into the skin at bedtime.      . INSULIN GLULISINE 100 UNIT/ML [place] SOLN Subcutaneous Inject 12 Units into the skin 3 (three) times daily before meals.      . ISOSORBIDE MONONITRATE ER 30 MG PO TB24 Oral Take 30 mg by mouth daily.      Marland Kitchen TAMSULOSIN HCL 0.4 MG PO CAPS Oral Take 0.4 mg by mouth daily.      . TRAZODONE HCL 50 MG PO TABS Oral Take 50 mg by mouth at bedtime.        BP 189/109  Pulse 60  Temp 98 F (36.7 C)  Resp 20  SpO2 97%  Physical Exam  Nursing note and vitals reviewed. Constitutional: No distress.  HENT:  Head: Normocephalic and atraumatic.  Right Ear: External ear normal.  Left Ear: External ear normal.  Mouth/Throat: No oropharyngeal exudate.       Mucous membranes dry  Eyes: Conjunctivae are normal. Right eye exhibits no discharge. Left eye exhibits no discharge. No scleral icterus.  Neck: Neck supple. No tracheal deviation present.  Cardiovascular: Normal rate, regular rhythm and intact distal pulses.   Pulmonary/Chest: Effort normal and breath sounds normal. No stridor. No respiratory distress. She has no wheezes. She has no rales.  Abdominal: Soft. Bowel sounds are normal. She exhibits mass. She exhibits no distension. There is no tenderness. There is no rebound and no guarding.  Musculoskeletal: She exhibits no edema and no tenderness.  Neurological: She is alert. She has normal strength. Abnormal reflex: fullness suprapubic region. No sensory deficit. Cranial nerve deficit:  no gross defecits noted. She exhibits normal muscle tone. She displays no seizure activity. Coordination normal.  Skin: Skin is warm and dry. No rash noted. She is not  diaphoretic.  Psychiatric: She has a normal mood and affect.    ED Course  Procedures (including critical care time)  Labs Reviewed  CBC - Abnormal; Notable for the following:    WBC 11.1 (*)    All other components within normal limits  DIFFERENTIAL - Abnormal; Notable for the following:    Neutrophils Relative 86 (*)    Neutro Abs 9.5 (*)    Lymphocytes Relative 8 (*)    All other components within normal limits  BASIC METABOLIC PANEL - Abnormal; Notable for the following:    Sodium 126 (*)    Chloride 87 (*)    Glucose, Bld 186 (*)    BUN 24 (*)    Creatinine, Ser 1.60 (*)    GFR calc non Af Amer 27 (*)    GFR calc Af Amer 32 (*)    All other components within normal limits  URINALYSIS, ROUTINE W REFLEX MICROSCOPIC  URINE CULTURE    1. Urinary retention   2. Hyponatremia    MDM  Patient feels much better after her Foley catheter placement. She is no longer having any abdominal discomfort. Her urinalysis does not suggest infection. Incidentally she is to be somewhat hyponatremic and hypochloremic. She has been given IV fluids while she's been here. I will have her followup with her primary doctor to get that rechecked. She does have some mild increase in her renal insufficiency and I suspect it is related to her urinary obstruction. I discussed the results with the patient and her daughter. She will be discharged home with Foley catheter in place and will follow up with her urologist and primary Dr. this coming week.       Celene Kras, MD 04/10/11 [phone]

## 2011-04-10 NOTE — ED Notes (Signed)
MD at bedside. 

## 2011-04-11 LAB — URINE CULTURE: Culture: NO GROWTH

## 2011-04-21 ENCOUNTER — Emergency Department (HOSPITAL_COMMUNITY): Payer: Medicare Other

## 2011-04-21 ENCOUNTER — Other Ambulatory Visit: Payer: Self-pay

## 2011-04-21 ENCOUNTER — Encounter (HOSPITAL_COMMUNITY): Payer: Self-pay | Admitting: *Deleted

## 2011-04-21 ENCOUNTER — Inpatient Hospital Stay (HOSPITAL_COMMUNITY)
Admission: EM | Admit: 2011-04-21 | Discharge: 2011-04-27 | DRG: 689 | Disposition: A | Discharge status: Skilled Nursing Facility | Payer: Medicare Other | Attending: Internal Medicine | Admitting: Internal Medicine

## 2011-04-21 DIAGNOSIS — E039 Hypothyroidism, unspecified: Secondary | ICD-10-CM | POA: Diagnosis present

## 2011-04-21 DIAGNOSIS — L03119 Cellulitis of unspecified part of limb: Secondary | ICD-10-CM | POA: Diagnosis present

## 2011-04-21 DIAGNOSIS — I10 Essential (primary) hypertension: Secondary | ICD-10-CM | POA: Diagnosis present

## 2011-04-21 DIAGNOSIS — K2289 Other specified disease of esophagus: Secondary | ICD-10-CM | POA: Diagnosis present

## 2011-04-21 DIAGNOSIS — I509 Heart failure, unspecified: Secondary | ICD-10-CM | POA: Diagnosis present

## 2011-04-21 DIAGNOSIS — R197 Diarrhea, unspecified: Secondary | ICD-10-CM | POA: Diagnosis present

## 2011-04-21 DIAGNOSIS — Z8679 Personal history of other diseases of the circulatory system: Secondary | ICD-10-CM

## 2011-04-21 DIAGNOSIS — G92 Toxic encephalopathy: Secondary | ICD-10-CM | POA: Diagnosis present

## 2011-04-21 DIAGNOSIS — R234 Changes in skin texture: Secondary | ICD-10-CM | POA: Diagnosis present

## 2011-04-21 DIAGNOSIS — E1029 Type 1 diabetes mellitus with other diabetic kidney complication: Secondary | ICD-10-CM | POA: Diagnosis present

## 2011-04-21 DIAGNOSIS — F0390 Unspecified dementia without behavioral disturbance: Secondary | ICD-10-CM

## 2011-04-21 DIAGNOSIS — R0902 Hypoxemia: Secondary | ICD-10-CM | POA: Diagnosis not present

## 2011-04-21 DIAGNOSIS — R062 Wheezing: Secondary | ICD-10-CM | POA: Diagnosis present

## 2011-04-21 DIAGNOSIS — I5033 Acute on chronic diastolic (congestive) heart failure: Secondary | ICD-10-CM | POA: Diagnosis present

## 2011-04-21 DIAGNOSIS — G929 Unspecified toxic encephalopathy: Secondary | ICD-10-CM | POA: Diagnosis present

## 2011-04-21 DIAGNOSIS — E871 Hypo-osmolality and hyponatremia: Secondary | ICD-10-CM | POA: Diagnosis present

## 2011-04-21 DIAGNOSIS — A498 Other bacterial infections of unspecified site: Secondary | ICD-10-CM | POA: Diagnosis present

## 2011-04-21 DIAGNOSIS — N058 Unspecified nephritic syndrome with other morphologic changes: Secondary | ICD-10-CM | POA: Diagnosis present

## 2011-04-21 DIAGNOSIS — E860 Dehydration: Secondary | ICD-10-CM | POA: Diagnosis present

## 2011-04-21 DIAGNOSIS — T361X5A Adverse effect of cephalosporins and other beta-lactam antibiotics, initial encounter: Secondary | ICD-10-CM | POA: Diagnosis not present

## 2011-04-21 DIAGNOSIS — D649 Anemia, unspecified: Secondary | ICD-10-CM | POA: Diagnosis present

## 2011-04-21 DIAGNOSIS — R112 Nausea with vomiting, unspecified: Secondary | ICD-10-CM | POA: Diagnosis present

## 2011-04-21 DIAGNOSIS — R4702 Dysphasia: Secondary | ICD-10-CM | POA: Diagnosis present

## 2011-04-21 DIAGNOSIS — I2510 Atherosclerotic heart disease of native coronary artery without angina pectoris: Secondary | ICD-10-CM | POA: Diagnosis present

## 2011-04-21 DIAGNOSIS — E1065 Type 1 diabetes mellitus with hyperglycemia: Secondary | ICD-10-CM | POA: Diagnosis present

## 2011-04-21 DIAGNOSIS — N390 Urinary tract infection, site not specified: Secondary | ICD-10-CM | POA: Diagnosis present

## 2011-04-21 DIAGNOSIS — R339 Retention of urine, unspecified: Secondary | ICD-10-CM | POA: Diagnosis present

## 2011-04-21 DIAGNOSIS — N179 Acute kidney failure, unspecified: Secondary | ICD-10-CM | POA: Diagnosis present

## 2011-04-21 DIAGNOSIS — K228 Other specified diseases of esophagus: Secondary | ICD-10-CM | POA: Diagnosis present

## 2011-04-21 DIAGNOSIS — L02419 Cutaneous abscess of limb, unspecified: Secondary | ICD-10-CM | POA: Diagnosis present

## 2011-04-21 DIAGNOSIS — E875 Hyperkalemia: Secondary | ICD-10-CM | POA: Diagnosis present

## 2011-04-21 DIAGNOSIS — R05 Cough: Secondary | ICD-10-CM

## 2011-04-21 DIAGNOSIS — N183 Chronic kidney disease, stage 3 unspecified: Secondary | ICD-10-CM | POA: Diagnosis present

## 2011-04-21 DIAGNOSIS — R4789 Other speech disturbances: Secondary | ICD-10-CM | POA: Diagnosis present

## 2011-04-21 DIAGNOSIS — T782XXA Anaphylactic shock, unspecified, initial encounter: Secondary | ICD-10-CM | POA: Diagnosis not present

## 2011-04-21 DIAGNOSIS — I129 Hypertensive chronic kidney disease with stage 1 through stage 4 chronic kidney disease, or unspecified chronic kidney disease: Secondary | ICD-10-CM | POA: Diagnosis present

## 2011-04-21 HISTORY — DX: Urinary tract infection, site not specified: N39.0

## 2011-04-21 HISTORY — DX: Other specified diseases of esophagus: K22.8

## 2011-04-21 HISTORY — DX: Anaphylactic shock, unspecified, initial encounter: T78.2XXA

## 2011-04-21 HISTORY — DX: Retention of urine, unspecified: R33.9

## 2011-04-21 HISTORY — DX: Chronic diastolic (congestive) heart failure: I50.32

## 2011-04-21 LAB — BASIC METABOLIC PANEL
BUN: 33 mg/dL — ABNORMAL HIGH (ref 6–23)
CO2: 29 mEq/L (ref 19–32)
Chloride: 92 mEq/L — ABNORMAL LOW (ref 96–112)
GFR calc Af Amer: 27 mL/min — ABNORMAL LOW (ref >90)
Potassium: 5.3 mEq/L — ABNORMAL HIGH (ref 3.5–5.1)

## 2011-04-21 LAB — URINALYSIS, ROUTINE W REFLEX MICROSCOPIC
Bilirubin Urine: NEGATIVE
Glucose, UA: 250 mg/dL — AB
Nitrite: NEGATIVE
Specific Gravity, Urine: 1.016 (ref 1.005–1.030)
pH: 6 (ref 5.0–8.0)

## 2011-04-21 LAB — GLUCOSE, CAPILLARY: Glucose-Capillary: 249 mg/dL — ABNORMAL HIGH (ref 70–99)

## 2011-04-21 LAB — CBC
HCT: 36.4 % (ref 36.0–46.0)
MCHC: 32.4 g/dL (ref 30.0–36.0)
RDW: 13.5 % (ref 11.5–15.5)

## 2011-04-21 LAB — LACTIC ACID, PLASMA: Lactic Acid, Venous: 2.3 mmol/L — ABNORMAL HIGH (ref 0.5–2.2)

## 2011-04-21 LAB — CREATININE, SERUM: GFR calc non Af Amer: 20 mL/min — ABNORMAL LOW (ref >90)

## 2011-04-21 LAB — URINE MICROSCOPIC-ADD ON

## 2011-04-21 MED ORDER — ACETAMINOPHEN 650 MG RE SUPP: 650.0000 mg | Freq: Four times a day (QID) | RECTAL | Status: DC | PRN

## 2011-04-21 MED ORDER — INSULIN ASPART 100 UNIT/ML ~~LOC~~ SOLN
0.0000 [IU] | Freq: Every day | SUBCUTANEOUS | Status: DC
  Administered 2011-04-21: 2 [IU] via SUBCUTANEOUS
  Administered 2011-04-22: 5 [IU] via SUBCUTANEOUS

## 2011-04-21 MED ORDER — SODIUM CHLORIDE 0.9 % IV SOLN
INTRAVENOUS | Status: DC
  Administered 2011-04-21 – 2011-04-24 (×3): via INTRAVENOUS

## 2011-04-21 MED ORDER — PIPERACILLIN-TAZOBACTAM 3.375 G IVPB
3.3750 g | Freq: Three times a day (TID) | INTRAVENOUS | Status: DC
  Administered 2011-04-21 – 2011-04-23 (×5): 3.375 g via INTRAVENOUS
  Filled 2011-04-21 (×6): qty 50

## 2011-04-21 MED ORDER — INSULIN GLARGINE 100 UNIT/ML ~~LOC~~ SOLN
20.0000 [IU] | Freq: Every day | SUBCUTANEOUS | Status: DC
  Administered 2011-04-21: 20 [IU] via SUBCUTANEOUS

## 2011-04-21 MED ORDER — LEVOTHYROXINE SODIUM 25 MCG PO TABS
25.0000 ug | ORAL_TABLET | Freq: Every day | ORAL | Status: DC
  Administered 2011-04-22: 25 ug via ORAL
  Filled 2011-04-21 (×2): qty 1

## 2011-04-21 MED ORDER — ENOXAPARIN SODIUM 30 MG/0.3ML ~~LOC~~ SOLN
30.0000 mg | SUBCUTANEOUS | Status: DC
  Administered 2011-04-21 – 2011-04-26 (×6): 30 mg via SUBCUTANEOUS
  Filled 2011-04-21 (×7): qty 0.3

## 2011-04-21 MED ORDER — GABAPENTIN 300 MG PO CAPS
300.0000 mg | ORAL_CAPSULE | Freq: Every day | ORAL | Status: DC
  Administered 2011-04-22 – 2011-04-27 (×6): 300 mg via ORAL
  Filled 2011-04-21 (×6): qty 1

## 2011-04-21 MED ORDER — ACETAMINOPHEN 325 MG PO TABS
650.0000 mg | ORAL_TABLET | Freq: Four times a day (QID) | ORAL | Status: DC | PRN
  Administered 2011-04-23: 650 mg via ORAL
  Filled 2011-04-21: qty 2

## 2011-04-21 MED ORDER — DEXTROSE 5 % IV SOLN
1.0000 g | Freq: Once | INTRAVENOUS | Status: AC
  Administered 2011-04-21: 1 g via INTRAVENOUS
  Filled 2011-04-21: qty 10

## 2011-04-21 MED ORDER — SODIUM CHLORIDE 0.9 % IV SOLN: INTRAVENOUS | Status: DC

## 2011-04-21 MED ORDER — ADULT MULTIVITAMIN W/MINERALS CH
1.0000 | ORAL_TABLET | Freq: Every day | ORAL | Status: DC
  Administered 2011-04-22 – 2011-04-27 (×6): 1 via ORAL
  Filled 2011-04-21 (×6): qty 1

## 2011-04-21 MED ORDER — OXYBUTYNIN CHLORIDE 5 MG PO TABS
10.0000 mg | ORAL_TABLET | Freq: Every day | ORAL | Status: DC
  Administered 2011-04-21 – 2011-04-26 (×6): 10 mg via ORAL
  Filled 2011-04-21 (×8): qty 2

## 2011-04-21 MED ORDER — VITAMIN B-12 1000 MCG PO TABS
1000.0000 ug | ORAL_TABLET | Freq: Every day | ORAL | Status: DC
  Administered 2011-04-22 – 2011-04-27 (×6): 1000 ug via ORAL
  Filled 2011-04-21 (×6): qty 1

## 2011-04-21 MED ORDER — HYDROMORPHONE HCL PF 1 MG/ML IJ SOLN: 0.5000 mg | INTRAMUSCULAR | Status: DC | PRN

## 2011-04-21 MED ORDER — ONDANSETRON HCL 4 MG PO TABS: 4.0000 mg | ORAL_TABLET | Freq: Four times a day (QID) | ORAL | Status: DC | PRN

## 2011-04-21 MED ORDER — CARVEDILOL 6.25 MG PO TABS
6.2500 mg | ORAL_TABLET | Freq: Two times a day (BID) | ORAL | Status: DC
  Administered 2011-04-22 – 2011-04-27 (×11): 6.25 mg via ORAL
  Filled 2011-04-21 (×12): qty 1

## 2011-04-21 MED ORDER — LEVOTHYROXINE SODIUM 200 MCG PO TABS
200.0000 ug | ORAL_TABLET | Freq: Every day | ORAL | Status: DC
  Administered 2011-04-22: 200 ug via ORAL
  Filled 2011-04-21 (×2): qty 1

## 2011-04-21 MED ORDER — PANTOPRAZOLE SODIUM 40 MG IV SOLR
40.0000 mg | INTRAVENOUS | Status: DC
  Administered 2011-04-21 – 2011-04-26 (×6): 40 mg via INTRAVENOUS
  Filled 2011-04-21 (×7): qty 40

## 2011-04-21 MED ORDER — VITAMIN E 45 MG (100 UNIT) PO CAPS
100.0000 [IU] | ORAL_CAPSULE | Freq: Every day | ORAL | Status: DC
  Administered 2011-04-22 – 2011-04-27 (×6): 100 [IU] via ORAL
  Filled 2011-04-21 (×6): qty 1

## 2011-04-21 MED ORDER — ACETAMINOPHEN 325 MG PO TABS
650.0000 mg | ORAL_TABLET | Freq: Once | ORAL | Status: AC
  Administered 2011-04-21: 650 mg via ORAL
  Filled 2011-04-21: qty 2

## 2011-04-21 MED ORDER — TAMSULOSIN HCL 0.4 MG PO CAPS
0.4000 mg | ORAL_CAPSULE | Freq: Every day | ORAL | Status: DC
  Administered 2011-04-22 – 2011-04-27 (×6): 0.4 mg via ORAL
  Filled 2011-04-21 (×6): qty 1

## 2011-04-21 MED ORDER — ONDANSETRON HCL 4 MG/2ML IJ SOLN: 4.0000 mg | Freq: Four times a day (QID) | INTRAMUSCULAR | Status: DC | PRN

## 2011-04-21 MED ORDER — ISOSORBIDE MONONITRATE ER 30 MG PO TB24
30.0000 mg | ORAL_TABLET | Freq: Every day | ORAL | Status: DC
  Administered 2011-04-22 – 2011-04-27 (×6): 30 mg via ORAL
  Filled 2011-04-21 (×6): qty 1

## 2011-04-21 MED ORDER — INSULIN ASPART 100 UNIT/ML ~~LOC~~ SOLN
0.0000 [IU] | Freq: Three times a day (TID) | SUBCUTANEOUS | Status: DC
  Administered 2011-04-22: 11 [IU] via SUBCUTANEOUS
  Administered 2011-04-22: 5 [IU] via SUBCUTANEOUS
  Administered 2011-04-22: 8 [IU] via SUBCUTANEOUS

## 2011-04-21 MED ORDER — CALCIUM CARBONATE 1250 (500 CA) MG PO TABS
1.0000 | ORAL_TABLET | Freq: Every day | ORAL | Status: DC
  Administered 2011-04-22 – 2011-04-27 (×6): 500 mg via ORAL
  Filled 2011-04-21 (×6): qty 1

## 2011-04-21 MED ORDER — TRAZODONE HCL 50 MG PO TABS
50.0000 mg | ORAL_TABLET | Freq: Every day | ORAL | Status: DC
  Administered 2011-04-22 – 2011-04-26 (×5): 50 mg via ORAL
  Filled 2011-04-21 (×7): qty 1

## 2011-04-21 MED ORDER — VANCOMYCIN HCL 1000 MG IV SOLR
750.0000 mg | INTRAVENOUS | Status: DC
  Administered 2011-04-21 – 2011-04-22 (×2): 750 mg via INTRAVENOUS
  Filled 2011-04-21 (×2): qty 750

## 2011-04-21 MED ORDER — SODIUM CHLORIDE 0.9 % IV SOLN
INTRAVENOUS | Status: DC
  Administered 2011-04-21 (×2): via INTRAVENOUS

## 2011-04-21 NOTE — Progress Notes (Signed)
ANTIBIOTIC CONSULT NOTE - INITIAL  Pharmacy Consult for Vancomycin & Zosyn Indication: RLE cellulitis  Allergies  Allergen Reactions  . Ephedrine   . Morphine     Patient Measurements: Height: 5\' 8"  (172.7 cm) Weight: 176 lb 9.4 oz (80.1 kg) IBW/kg (Calculated) : 63.9  Adjusted Body Weight: 70.4 kg  Vital Signs: Temp: 97.5 F (36.4 C) (03/27 2133) Temp src: Oral (03/27 2133) BP: 139/[name] mmHg (03/27 2133) Pulse Rate: 68  (03/27 2133) Intake/Output from previous day:   Intake/Output from this shift: Total I/O In: -  Out: 700 [Urine:700]  Labs:  [facility] 04/21/11 1341  WBC --  HGB --  PLT --  LABCREA --  CREATININE 1.85*   Estimated Creatinine Clearance: 22.9 ml/min (by C-G formula based on Cr of 1.85).   Microbiology: Recent Results (from the past 720 hour(s))  URINE CULTURE     Status: Normal   Collection Time   04/10/11  1:40 PM      Component Value Range Status Comment   Specimen Description URINE, CATHETERIZED   Final    Special Requests NONE   Final    Culture  Setup Time [id number]   Final    Colony Count NO GROWTH   Final    Culture NO GROWTH   Final    Report Status 04/11/2011 FINAL   Final    3/27 Blood x2: pending 3/27 Urine: pending  Medical History: Past Medical History  Diagnosis Date  . Myocardial infarction   . Coronary artery disease   . Hypertension   . Dysrhythmia   . Peripheral vascular disease   . Hypothyroidism   . Diabetes mellitus     Medications:  Scheduled:    . acetaminophen  650 mg Oral Once  . calcium carbonate  1 tablet Oral Daily  . carvedilol  6.25 mg Oral BID WC  . cefTRIAXone (ROCEPHIN)  IV  1 g Intravenous Once  . enoxaparin  30 mg Subcutaneous Q24H  . gabapentin  300 mg Oral Daily  . insulin aspart  0-15 Units Subcutaneous TID WC  . insulin aspart  0-5 Units Subcutaneous QHS  . insulin glargine  20 Units Subcutaneous QHS  . isosorbide mononitrate  30 mg Oral Daily  . levothyroxine  200 mcg Oral QAC  breakfast  . levothyroxine  25 mcg Oral QAC breakfast  . mulitivitamin with minerals  1 tablet Oral Daily  . oxybutynin  10 mg Oral QHS  . pantoprazole (PROTONIX) IV  40 mg Intravenous Q24H  . Tamsulosin HCl  0.4 mg Oral Daily  . vitamin B-12  1,000 mcg Oral Daily  . vitamin E  100 Units Oral Daily   Assessment:  [name] with multiple medical problems presents to ER with nausea & vomiting, presumed UTI  Beginning vanc/zosyn for cellulitis of the right lower leg  Goal of Therapy:  Vancomycin trough level 10-15 mcg/ml  Plan:   Vancomycin 750mg  IV q24h  Check trough at steady state Zosyn 3.375gm IV q8h (4hr extended infusions) Follow renal function & cultures   Loralee Pacas, PharmD, BCPS Pager: [phone] 04/21/2011,9:51 PM

## 2011-04-21 NOTE — ED Notes (Signed)
Report called to 4th floor. 

## 2011-04-21 NOTE — ED Notes (Signed)
LEX:NT72<YF> Expected date:04/21/11<BR> Expected time: 1:16 PM<BR> Means of arrival:Ambulance<BR> Comments:<BR> General wkness

## 2011-04-21 NOTE — ED Notes (Signed)
MD at bedside. 

## 2011-04-21 NOTE — ED Provider Notes (Signed)
History     CSN: [id number]  Arrival date & time 04/21/11  1315   First MD Initiated Contact with Patient 04/21/11 1328      Chief Complaint  Patient presents with  . Hyperglycemia    (Consider location/radiation/quality/duration/timing/severity/associated sxs/prior treatment) The history is provided by the patient and medical records. The history is limited by the condition of the patient.   the patient is an 78 year old, female, who presents to emergency department complaining of nausea, vomiting, no energy.  Since last night.  She states that she was not able to eat last night because her nausea and vomiting.  She went to sleep around 6:30 and slept all through the night.  She said she did want to get out of bed today, because she feels so bad.  She denies a cough, fevers, chills, diarrhea, or urinary tract symptoms.  She denies pain anywhere.  She has not missed her insulin until this morning.  Level V caveat applies for severe illness  Past Medical History  Diagnosis Date  . Myocardial infarction   . Coronary artery disease   . Hypertension   . Dysrhythmia   . Peripheral vascular disease   . Hypothyroidism   . Diabetes mellitus     Past Surgical History  Procedure Date  . Cholecystectomy   . Colonoscopy 01/15/2011    Procedure: COLONOSCOPY;  Surgeon: Theda Belfast, MD;  Location: [facility];  Service: Endoscopy;  Laterality: N/A;    No family history on file.  History  Substance Use Topics  . Smoking status: Former Smoker    Types: Cigarettes  . Smokeless tobacco: Not on file  . Alcohol Use: Yes     social    OB History    Grav Para Term Preterm Abortions TAB SAB Ect Mult Living                  Review of Systems  Constitutional: Negative for fever and chills.  Respiratory: Negative for cough and chest tightness.   Cardiovascular: Negative for chest pain.  Gastrointestinal: Positive for nausea and vomiting. Negative for abdominal pain and diarrhea.    Genitourinary: Negative for dysuria.  Musculoskeletal: Negative for back pain.  Neurological: Positive for weakness. Negative for headaches.  Hematological: Does not bruise/bleed easily.  Psychiatric/Behavioral: Negative for confusion.  All other systems reviewed and are negative.    Allergies  Ephedrine and Morphine  Home Medications   Current Outpatient Rx  Name Route Sig Dispense Refill  . CARVEDILOL 6.25 MG PO TABS Oral Take 6.25 mg by mouth 2 (two) times daily with a meal.      . DILTIAZEM HCL 120 MG PO TABS Oral Take 120 mg by mouth daily.      . FUROSEMIDE 20 MG PO TABS Oral Take 20 mg by mouth 2 (two) times daily.      Marland Kitchen GABAPENTIN 300 MG PO CAPS Oral Take 300 mg by mouth daily.      . INSULIN GLARGINE 100 UNIT/ML [place] SOLN Subcutaneous Inject 42 Units into the skin at bedtime.      . INSULIN GLULISINE 100 UNIT/ML [place] SOLN Subcutaneous Inject 12 Units into the skin 3 (three) times daily before meals.      . ISOSORBIDE MONONITRATE ER 30 MG PO TB24 Oral Take 30 mg by mouth daily.      Marland Kitchen TAMSULOSIN HCL 0.4 MG PO CAPS Oral Take 0.4 mg by mouth daily.      . TRAZODONE HCL 50 MG  PO TABS Oral Take 50 mg by mouth at bedtime.        BP 180/51  Pulse 90  Temp(Src) 101 F (38.3 C) (Oral)  SpO2 92%  Physical Exam  Vitals reviewed. Constitutional: She appears well-developed and well-nourished. No distress.       Listless  HENT:  Head: Normocephalic and atraumatic.  Eyes: Conjunctivae are normal.  Neck: Normal range of motion. Neck supple.  Cardiovascular: Normal rate.   No murmur heard. Pulmonary/Chest: Effort normal. No respiratory distress. She has no rales.  Abdominal: Soft. There is no tenderness.  Musculoskeletal: Normal range of motion. She exhibits no edema.  Neurological: No cranial nerve deficit.       Arousable  Skin: Skin is warm and dry.  Psychiatric: Thought content normal.    ED Course  Procedures (including critical care time) 78 year old  insulin-dependent diabetic, presents emergency department with a fever, hyperglycemia, and listless behavior, associated with nausea and vomiting since last night.  We will establish an IV perform laboratory testing, and chest x-ray, for further evaluation to find a source of her fever, and degree of illness  Labs Reviewed  GLUCOSE, CAPILLARY - Abnormal; Notable for the following:    Glucose-Capillary 210 (*)    All other components within normal limits  BASIC METABOLIC PANEL  URINALYSIS, ROUTINE W REFLEX MICROSCOPIC   No results found.   No diagnosis found.  ED ECG REPORT   Date: 04/21/2011  EKG Time: 3:40 PM  Rate: 85  Rhythm: normal sinus rhythm,    Axis: nl  Intervals:none  ST&T Change: none  Narrative Interpretation: nsr            MDM  Fever Hyperglycemia ams        Cheri Guppy, MD 04/23/11 [id number]

## 2011-04-21 NOTE — H&P (Addendum)
PCP:   [name], MD, MD   Chief Complaint:  Vomiting, feeling unwell.  HPI: This is an 78 year old female, with known history of CAD, s/p MI, Atrial flutter, s/p ablation in May 2010, not anticoagulation candidate secondary to fall risk, HTN, dyslipidemia, hypothyroidism, ischemic colitis 2008, overactive bladder, s/p bladder tack x 2, recurrent UTIs, CRI, baseline creatinine of 1.2-1.4 in 2011, s/p Cholecystectomy, s/p Hysterectomy, s/p ORIF right humeral fracture, followed by subsequent hardware removal. Patient is a poor historian, and appears somewhat confused, so history is gleaned from ED MD. Reportedly, patient has had nausea and vomiting for one day, and was brought to ED by EMS. She denies fever, abdominal pain, chest pain or diarrhea.  Allergies:   Allergies  Allergen Reactions  . Ephedrine   . Morphine       Past Medical History  Diagnosis Date  . Myocardial infarction   . Coronary artery disease   . Hypertension   . Dysrhythmia   . Peripheral vascular disease   . Hypothyroidism   . Diabetes mellitus     Past Surgical History  Procedure Date  . Cholecystectomy   . Colonoscopy 01/15/2011    Procedure: COLONOSCOPY;  Surgeon: Theda Belfast, MD;  Location: [facility];  Service: Endoscopy;  Laterality: N/A;    Prior to Admission medications   Medication Sig Start Date End Date Taking? Authorizing Provider  acetaminophen (TYLENOL) 500 MG tablet Take 500 mg by mouth every 6 (six) hours as needed.   Yes Historical Provider, MD  [place] POWD 1 capsule by Does not apply route as needed.   Yes Historical Provider, MD  calcium carbonate (OS-CAL - DOSED IN MG OF ELEMENTAL CALCIUM) 1250 MG tablet Take 1 tablet by mouth daily.   Yes Historical Provider, MD  carvedilol (COREG) 6.25 MG tablet Take 6.25 mg by mouth 2 (two) times daily with a meal.     Yes Historical Provider, MD  diltiazem (CARDIZEM) 120 MG tablet Take 120 mg by mouth daily.     Yes Historical Provider, MD    furosemide (LASIX) 20 MG tablet Take 20 mg by mouth 2 (two) times daily.     Yes Historical Provider, MD  gabapentin (NEURONTIN) 300 MG capsule Take 300 mg by mouth daily.     Yes Historical Provider, MD  insulin glargine (LANTUS) 100 UNIT/ML injection Inject 42 Units into the skin at bedtime.     Yes Historical Provider, MD  insulin glulisine (APIDRA) 100 UNIT/ML injection Inject 12 Units into the skin 3 (three) times daily before meals.     Yes Historical Provider, MD  isosorbide mononitrate (IMDUR) 30 MG 24 hr tablet Take 30 mg by mouth daily.     Yes Historical Provider, MD  levothyroxine (SYNTHROID, LEVOTHROID) 200 MCG tablet Take 200 mcg by mouth daily.   Yes Historical Provider, MD  levothyroxine (SYNTHROID, LEVOTHROID) 25 MCG tablet Take 25 mcg by mouth daily.   Yes Historical Provider, MD  Multiple Vitamin (MULITIVITAMIN WITH MINERALS) TABS Take 1 tablet by mouth daily.   Yes Historical Provider, MD  oxybutynin (DITROPAN) 5 MG tablet Take 10 mg by mouth at bedtime.   Yes Historical Provider, MD  paregoric 2 MG/5ML solution Take 10 mLs by mouth 4 (four) times daily as needed. For bowel movement   Yes Historical Provider, MD  Tamsulosin HCl (FLOMAX) 0.4 MG CAPS Take 0.4 mg by mouth daily.     Yes Historical Provider, MD  traZODone (DESYREL) 50 MG tablet Take  50 mg by mouth at bedtime.     Yes Historical Provider, MD  vitamin B-12 (CYANOCOBALAMIN) 1000 MCG tablet Take 1,000 mcg by mouth daily.   Yes Historical Provider, MD  vitamin E 100 UNIT capsule Take 100 Units by mouth daily.   Yes Historical Provider, MD    Social History:  reports that she quit smoking about 30 years ago. Her smoking use included Cigarettes. She has never used smokeless tobacco. She reports that she drinks alcohol. She reports that she does not use illicit drugs.  History reviewed. No pertinent family history.  Review of Systems:  As per HPI and chief complaint. Patent denies fatigue, diminished appetite, weight  loss, fever, chills, headache, blurred vision, difficulty in speaking, dysphagia, chest pain, cough, shortness of breath, orthopnea, paroxysmal nocturnal dyspnea, diaphoresis, abdominal pain, diarrhea, belching, heartburn, hematemesis, melena, hematochezia. She has had a sore of her right great toe for about a year. The rest of the systems review is negative.  Physical Exam:  General:  Patient does not appear to be in obvious acute distress. Alert, communicative, somewhat confused, but following simple commands accurately, talking in complete sentences, not short of breath at rest.  HEENT:  No clinical pallor, no jaundice, no conjunctival injection or discharge. Visible buccal mucosae appear "dry". NECK:  Supple, JVP not seen, no carotid bruits, no palpable lymphadenopathy, no palpable goiter. CHEST:  Clinically clear to auscultation, no wheezes, no crackles. HEART:  Sounds 1 and 2 heard, normal, regular, no murmurs. ABDOMEN:  Obese, soft, old surgical scars noted, non-tender, no palpable organomegaly, no palpable masses, normal bowel sounds. GENITALIA:  Not examined. LOWER EXTREMITIES:  Mild pitting edema, palpable peripheral pulses. Has redness lower third of right lower leg, with tenderness and increased local temperature. Also wounds plantar aspect right great toe, and right foot, which do not appear frankly purulent. MUSCULOSKELETAL SYSTEM:  Generalized osteoarthritic changes, otherwise, normal. CENTRAL NERVOUS SYSTEM:  No focal neurologic deficit on gross examination.  Labs on Admission:  Results for orders placed during the hospital encounter of 04/21/11 (from the past 48 hour(s))  GLUCOSE, CAPILLARY     Status: Abnormal   Collection Time   04/21/11  1:38 PM      Component Value Range Comment   Glucose-Capillary 210 (*) 70 - 99 (mg/dL)    Comment 1 Documented in Chart      Comment 2 Notify RN     BASIC METABOLIC PANEL     Status: Abnormal   Collection Time   04/21/11  1:41 PM       Component Value Range Comment   Sodium 131 (*) 135 - 145 (mEq/L)    Potassium 5.3 (*) 3.5 - 5.1 (mEq/L) MODERATE HEMOLYSIS   Chloride 92 (*) 96 - 112 (mEq/L)    CO2 29  19 - 32 (mEq/L)    Glucose, Bld 232 (*) 70 - 99 (mg/dL)    BUN 33 (*) 6 - 23 (mg/dL)    Creatinine, Ser 1.61 (*) 0.50 - 1.10 (mg/dL)    Calcium 8.9  8.4 - 10.5 (mg/dL)    GFR calc non Af Amer 23 (*) >90 (mL/min)    GFR calc Af Amer 27 (*) >90 (mL/min)   TROPONIN I     Status: Normal   Collection Time   04/21/11  1:41 PM      Component Value Range Comment   Troponin I <0.30  <0.30 (ng/mL)   LACTIC ACID, PLASMA     Status: Abnormal   Collection  Time   04/21/11  2:15 PM      Component Value Range Comment   Lactic Acid, Venous 2.3 (*) 0.5 - 2.2 (mmol/L)   URINALYSIS, ROUTINE W REFLEX MICROSCOPIC     Status: Abnormal   Collection Time   04/21/11  5:52 PM      Component Value Range Comment   Color, Urine YELLOW  YELLOW     APPearance TURBID (*) CLEAR     Specific Gravity, Urine 1.016  1.005 - 1.030     pH 6.0  5.0 - 8.0     Glucose, UA 250 (*) NEGATIVE (mg/dL)    Hgb urine dipstick MODERATE (*) NEGATIVE     Bilirubin Urine NEGATIVE  NEGATIVE     Ketones, ur NEGATIVE  NEGATIVE (mg/dL)    Protein, ur 161 (*) NEGATIVE (mg/dL)    Urobilinogen, UA 0.2  0.0 - 1.0 (mg/dL)    Nitrite NEGATIVE  NEGATIVE     Leukocytes, UA LARGE (*) NEGATIVE    URINE MICROSCOPIC-ADD ON     Status: Abnormal   Collection Time   04/21/11  5:52 PM      Component Value Range Comment   Squamous Epithelial / LPF RARE  RARE     WBC, UA TOO NUMEROUS TO COUNT  <3 (WBC/hpf)    RBC / HPF 7-10  <3 (RBC/hpf)    Bacteria, UA FEW (*) RARE     Casts HYALINE CASTS (*) NEGATIVE      Radiological Exams on Admission: *RADIOLOGY REPORT*  Clinical Data: Weakness and shortness of breath.  PORTABLE CHEST - 1 VIEW  Comparison: Fourth 32,011.  Findings: The heart is mildly enlarged but stable. There is tortuosity and calcification of the thoracic aorta.  There is moderate vascular congestion and possible mild interstitial edema. No definite pleural effusions or focal infiltrates. The bony thorax is intact. Remote AVN involving the right humeral head.  IMPRESSION: Cardiac enlargement with vascular congestion and probable mild interstitial edema.  Original Report Authenticated By: P. Loralie Champagne, M.D.   Assessment/Plan Principal Problem:  *UTI (lower urinary tract infection): Patient has a positive urinary sediment, with marked pyuria, and known history of recurrent UTIs. As she is diabetic and elderly, this must be considered a complicated UTI. She has already been administered iv rocephin in the Ed, but we shall broaden coverage with Zosyn, and await cultures and sensitivities. Active Problems:  1. Cellulitis of leg, except foot: Patient appears to have cellulitis, affecting the lower third of her right lower leg. We shall manage with Zosyn as described above, but add Vancomycin, to cover MRSA.  2. Dehydration: Patient is clinically detydrated, ar evidenced by elevated BUN/Creatinine ratio, against a background of vomiting, reduced oral intake, and continued diuretic therapy. Management will be instituted with iv fluids, albeit gently, given known history of CAD,and CXR findings, suggestive of pulmonary vascular congestion and even possibly, edema. Diuretics, are of course, on hold.  3. Altered mental status: This is likely to be mild delirium,secondary to acute medical problems. Improvement is anticipated.  4. Hypothyroidism: We shall continue pre-admission synthroid, and check TSH.  5. Acute renal injury: Patient has a known base-line creatinine of 1.2-1.4 in 2011. Likely she has now presented with ARI on CKD, due to dehydration. We shall rehydrate as described above, and follow renal indices.  6. Wound eschar of foot: Chronic wounds on planter aspect to right great toe and foot. WOC consult will be needed, for appropriate care.  7. CAD  (coronary artery  disease: Details are not known, but patient has a previous history of MI. Cardiac enzymes are un-elevated so far., and patient has no complaints of chest pain or dyspnea. We shall complete cycling CEs, and place on telemetry.  8. History of atrial flutter: Patient is s/p ablation in May 2010. She is currently in sinus rhythm, and EKG shows no acute ischemic changes. 9. DM 2: This is insulin-requiring, and is uncontrolled, based on random blood glucose. We shall manage with appropriate diet, SSI, and scheduled Lantus insulin. As oral intake is unreliable at present, will halve home Lantus dose.  Further management will depend on clinical course.  Note: Patient is too confused to discuss code status at this time, and my attempt to reach her Daughter Annice Pih on Tel: [phone] and Tel: [phone], were unsuccessful. She is therefore FULL CODE, for now.  Time Spent on Admission: 1 hour.  Elen Acero,CHRISTOPHER 04/21/2011, 9:06 PM

## 2011-04-21 NOTE — ED Provider Notes (Addendum)
4:56 PM Lab workup reviewed--we still do not have a urinalysis result.  In and out cath ordered.  Carleene Cooper III, MD 04/21/11 1656  Carleene Cooper III, MD 04/21/11 1701  6:12 PM Pt told RN she has incomplete empyting.  Advised foley catheter, measure residual.   Carleene Cooper III, MD 04/21/11 1812  8:00 PM Lab workup showed a UTI.  Case discussed with Dr. Brien Few, admit to [facility], Dr. Darnelle Catalan.  Rx UTI with Rocephin.   Carleene Cooper III, MD 04/21/11 2001  8:52 PM Apparently Dr. Brien Few wants pt to be admitted to a telemetry floor.  Called Flow Manager to change this bed assignment.   Carleene Cooper III, MD 04/21/11 2052

## 2011-04-21 NOTE — ED Notes (Signed)
Nausea, vomitting since early this am...BG [id number]

## 2011-04-21 NOTE — ED Notes (Signed)
Pt. Arrived to TCU.

## 2011-04-22 ENCOUNTER — Inpatient Hospital Stay (HOSPITAL_COMMUNITY): Payer: Medicare Other

## 2011-04-22 DIAGNOSIS — R4702 Dysphasia: Secondary | ICD-10-CM | POA: Diagnosis present

## 2011-04-22 DIAGNOSIS — D649 Anemia, unspecified: Secondary | ICD-10-CM | POA: Diagnosis present

## 2011-04-22 DIAGNOSIS — E875 Hyperkalemia: Secondary | ICD-10-CM | POA: Diagnosis present

## 2011-04-22 DIAGNOSIS — N183 Chronic kidney disease, stage 3 unspecified: Secondary | ICD-10-CM | POA: Diagnosis present

## 2011-04-22 DIAGNOSIS — E871 Hypo-osmolality and hyponatremia: Secondary | ICD-10-CM | POA: Diagnosis present

## 2011-04-22 LAB — CBC
HCT: 33 % — ABNORMAL LOW (ref 36.0–46.0)
Hemoglobin: 10.7 g/dL — ABNORMAL LOW (ref 12.0–15.0)
MCH: 28.6 pg (ref 26.0–34.0)
MCHC: 32.4 g/dL (ref 30.0–36.0)
MCV: 88.2 fL (ref 78.0–100.0)
Platelets: 208 10*3/uL (ref 150–400)
RBC: 3.74 MIL/uL — ABNORMAL LOW (ref 3.87–5.11)
RDW: 13.7 % (ref 11.5–15.5)
WBC: 19.5 10*3/uL — ABNORMAL HIGH (ref 4.0–10.5)

## 2011-04-22 LAB — COMPREHENSIVE METABOLIC PANEL WITH GFR
ALT: 11 U/L (ref 0–35)
AST: 17 U/L (ref 0–37)
Albumin: 2.5 g/dL — ABNORMAL LOW (ref 3.5–5.2)
Alkaline Phosphatase: 74 U/L (ref 39–117)
BUN: 38 mg/dL — ABNORMAL HIGH (ref 6–23)
CO2: 28 meq/L (ref 19–32)
Calcium: 8.4 mg/dL (ref 8.4–10.5)
Chloride: 96 meq/L (ref 96–112)
Creatinine, Ser: 2.22 mg/dL — ABNORMAL HIGH (ref 0.50–1.10)
GFR calc Af Amer: 21 mL/min — ABNORMAL LOW
GFR calc non Af Amer: 18 mL/min — ABNORMAL LOW
Glucose, Bld: 235 mg/dL — ABNORMAL HIGH (ref 70–99)
Potassium: 3.8 meq/L (ref 3.5–5.1)
Sodium: 133 meq/L — ABNORMAL LOW (ref 135–145)
Total Bilirubin: 0.3 mg/dL (ref 0.3–1.2)
Total Protein: 6.1 g/dL (ref 6.0–8.3)

## 2011-04-22 LAB — GLUCOSE, CAPILLARY
Glucose-Capillary: 206 mg/dL — ABNORMAL HIGH (ref 70–99)
Glucose-Capillary: 319 mg/dL — ABNORMAL HIGH (ref 70–99)
Glucose-Capillary: 298 mg/dL — ABNORMAL HIGH (ref 70–99)

## 2011-04-22 LAB — TSH: TSH: 2.844 u[IU]/mL (ref 0.350–4.500)

## 2011-04-22 LAB — HEMOGLOBIN A1C: Hgb A1c MFr Bld: 8 % — ABNORMAL HIGH (ref <5.7)

## 2011-04-22 MED ORDER — GLUCERNA SHAKE PO LIQD
237.0000 mL | Freq: Every day | ORAL | Status: DC
  Administered 2011-04-22 – 2011-04-26 (×4): 237 mL via ORAL
  Filled 2011-04-22 (×8): qty 237

## 2011-04-22 MED ORDER — INSULIN GLARGINE 100 UNIT/ML ~~LOC~~ SOLN
30.0000 [IU] | Freq: Every day | SUBCUTANEOUS | Status: DC
  Administered 2011-04-22: 30 [IU] via SUBCUTANEOUS

## 2011-04-22 NOTE — Progress Notes (Signed)
PROGRESS NOTE  [name] NWG:[id number] DOB: [birth year] DOA: 04/21/2011 PCP: [name], MD, MD  Brief narrative: [name] is an 78 year old female with a past medical history of coronary artery disease, diabetes, atrial flutter, hypertension, dyslipidemia, hypothyroidism, and ischemic colitis who presented to the hospital on 04/21/2011 with nausea, vomiting, and feeling unwell. Upon initial evaluation emergency department, the patient was found to have evidence of both a urinary tract infection and right lower extremity cellulitis. She was also dehydrated. She was placed on broad-spectrum antibiotic therapy.  Assessment/Plan: Principal Problem:  *UTI (lower urinary tract infection) Patient was admitted and a urinalysis was done which showed a large amount of leukocytes and too numerous to count white blood cells on microscopy. There are also 7-10 red blood cells and a few bacteria. Nitrites were negative. Patient has been placed on broad-spectrum antibiotic, and cultures are pending at this time. Active Problems:  Cellulitis and abscess of leg, except foot / wound eschar of foot The patient does have some asymmetric erythema of her right lower extremity consistent with possible cellulitis. She has been placed on broad-spectrum antibiotics as noted above. The wound care nurse has been consulted for evaluation of her foot eschar.  Dehydration / Acute renal injury /  Stage III chronic kidney disease The patient has been placed on IV fluids and her diuretics have been placed on hold. Her creatinine has come down with IV fluid hydration. The patient does have significant renal impairment with her GFR ranging from 18 up to 45. This corresponds with stage III-4 chronic kidney disease. We will continue to hydrate her and monitor her renal function until it is back to baseline. Her baseline creatinine is as low as 1.14 (01/23/10) but most recently, her creatinine was 1.60 on 04/10/2011. Her underlying  chronic kidney disease is likely secondary to diabetic nephropathy.  Altered mental status / toxic encephalopathy Patient likely suffered from toxic encephalopathy related to infections. This point, her mental status is clear.  Hypothyroidism The patient's TSH was checked and was found to be within normal limits. Continue usual dose of Synthroid.  CAD (coronary artery disease) Patient denies chest pain though she is having palpitations. One troponin was checked in the emergency department, and it was negative. Continue Coreg, Imdur.  History of atrial flutter Patient is status post ablation and is on rate controlling medications. Her 12-lead EKG showed normal sinus rhythm. Patient has subjective palpitations but her heart sounds regular on exam. Continue Coreg, Cardizem. TSH was normal.  DM (diabetes mellitus) type I uncontrolled with renal manifestation Continue insulin therapy. CBGs 206-249 over the past 24 hours, so will increase Lantus to 30 units nightly.  Hyponatremia May be from hypothyroidism versus pseudo-hyponatremia from elevated blood glucoses. Mild. We'll continue to monitor.  Hyperkalemia Likely from acute kidney injury in the setting of advanced chronic kidney disease. Patient's potassium has normalized with hydration and improvement in her renal function.  Normocytic anemia Likely anemia of chronic kidney disease. Hemoglobin is stable.  Dysphasia Patient complains of subacute regurgitation of food consistent with dysphasia. Will ask for speech therapy evaluation.  Questionable pulmonary edema on chest radiography Patient does not have any clinical signs suggestive of decompensated heart failure. A two-dimensional echocardiogram was ordered by the admitting physician. Patient's last echocardiogram was done on 06/12/2008 and had a normal EF.   Code Status: Full Family Communication: Daughter Annice Pih  Tel: [phone] and Tel: [phone] Disposition Plan: Home, when  stable  Consultants:  Wound care nurse  Speech  therapy  Physical therapy  Occupational therapy  Procedures:  2 D echocardiogram pending  12-lead EKG 04/21/2011: SINUS RHYTHM ~ normal P axis, V-rate 50- 99  Antibiotics:  Zosyn 04/21/11--->  Vancomycin 04/21/11--->  Rocephin 04/21/2011---> 04/21/2011   Subjective  [name] complains of several months of regurgitation of food as opposed to frank nausea and vomiting.  She denies pain, but has a "fluttering" sensation in her chest.  Denies dyspnea.  Complains of LE edema.   Objective    Interim History: Stable overnight.   Objective: Filed Vitals:   04/21/11 2001 04/21/11 2013 04/21/11 2133 04/22/11 0546  BP: 128/40 130/48 139/69 106/62  Pulse: 74 69 68 81  Temp: 98.3 F (36.8 C) 98.3 F (36.8 C) 97.5 F (36.4 C) 97.8 F (36.6 C)  TempSrc: Oral Oral Oral Oral  Resp: 20  18 20   Height:   5\' 8"  (1.727 m)   Weight:   80.1 kg (176 lb 9.4 oz)   SpO2: 94% 95% 100% 91%    Intake/Output Summary (Last 24 hours) at 04/22/11 4540 Last data filed at 04/22/11 0600  Gross per 24 hour  Intake 612.33 ml  Output    900 ml  Net -287.67 ml    Exam: Gen:  NAD Cardiovascular:  RRR, No M/R/G Respiratory: Lungs CTAB Gastrointestinal: Abdomen soft, NT/ND with normal active bowel sounds. Extremities: 2+ edema bilaterally. Right greater than left lower leg erythema  Data Reviewed: Basic Metabolic Panel:  Lab 04/22/11 9811 04/21/11 2305 04/21/11 1341  NA 133* -- 131*  K 3.8 -- 5.3*  CL 96 -- 92*  CO2 28 -- 29  GLUCOSE 235* -- 232*  BUN 38* -- 33*  CREATININE 2.22* 2.07* 1.85*  CALCIUM 8.4 -- 8.9  MG -- -- --  PHOS -- -- --   GFR Estimated Creatinine Clearance: 19.1 ml/min (by C-G formula based on Cr of 2.22). Liver Function Tests:  Lab 04/22/11 0506  AST 17  ALT 11  ALKPHOS 74  BILITOT 0.3  PROT 6.1  ALBUMIN 2.5*    CBC:  Lab 04/22/11 0506 04/21/11 2305  WBC 19.5* 18.6*  NEUTROABS -- --  HGB 10.7*  11.8*  HCT 33.0* 36.4  MCV 88.2 88.3  PLT 208 233   Cardiac Enzymes:  Lab 04/21/11 1341  CKTOTAL --  CKMB --  CKMBINDEX --  TROPONINI <0.30   CBG:  Lab 04/22/11 0748 04/21/11 2145 04/21/11 1338  GLUCAP 206* 249* 210*   Hgb A1c  Basename 04/21/11 2305  HGBA1C 8.0*   Thyroid function studies  Basename 04/21/11 2305  TSH 2.844  T4TOTAL --  T3FREE --  THYROIDAB --   Microbiology No results found for this or any previous visit (from the past 240 hour(s)).  Studies:  Dg Chest Port 1 View 04/21/2011  *RADIOLOGY REPORT*  Clinical Data: Weakness and shortness of breath.  PORTABLE CHEST - 1 VIEW  Comparison: Fourth 32,011.  Findings: The heart is mildly enlarged but stable.  There is tortuosity and calcification of the thoracic aorta.  There is moderate vascular congestion and possible mild interstitial edema. No definite pleural effusions or focal infiltrates.  The bony thorax is intact.  Remote AVN involving the right humeral head.  IMPRESSION: Cardiac enlargement with vascular congestion and probable mild interstitial edema.  Original Report Authenticated By: P. Loralie Champagne, M.D.    Scheduled Meds:    . acetaminophen  650 mg Oral Once  . calcium carbonate  1 tablet Oral Daily  . carvedilol  6.25 mg Oral BID WC  . cefTRIAXone (ROCEPHIN)  IV  1 g Intravenous Once  . enoxaparin  30 mg Subcutaneous Q24H  . gabapentin  300 mg Oral Daily  . insulin aspart  0-15 Units Subcutaneous TID WC  . insulin aspart  0-5 Units Subcutaneous QHS  . insulin glargine  20 Units Subcutaneous QHS  . isosorbide mononitrate  30 mg Oral Daily  . levothyroxine  200 mcg Oral QAC breakfast  . levothyroxine  25 mcg Oral QAC breakfast  . mulitivitamin with minerals  1 tablet Oral Daily  . oxybutynin  10 mg Oral QHS  . pantoprazole (PROTONIX) IV  40 mg Intravenous Q24H  . piperacillin-tazobactam (ZOSYN)  IV  3.375 g Intravenous Q8H  . Tamsulosin HCl  0.4 mg Oral Daily  . traZODone  50 mg Oral QHS   . vancomycin  750 mg Intravenous Q24H  . vitamin B-12  1,000 mcg Oral Daily  . vitamin E  100 Units Oral Daily   Continuous Infusions:    . sodium chloride 50 mL/hr at 04/21/11 2250  . DISCONTD: sodium chloride 125 mL/hr at 04/21/11 2029  . DISCONTD: sodium chloride    . DISCONTD: sodium chloride        LOS: 1 day   Hillery Aldo, MD Pager [phone]  04/22/2011, 8:22 AM

## 2011-04-22 NOTE — Consult Note (Signed)
WOC consult Note Reason for Consult:chronic wounds on right foot Wound type:neuropathic vs traumatic Pressure Ulcer POA: No Measurement:right great toe .2cm round, no appreciable depth; righ tmedial foot, bunion area, 1.5 x 1cm callus with .4cm round wound in center (partial thickness) Drainage (amount, consistency, odor) None Periwound: intact, some edema Dressing procedure/placement/frequency:Patient has been closely followed by a podiatrist and has had a skin graft to this area (described by patient as a pig's penis, I am assuming that she is referring to a bovine graft similar to if not OASIS).  After complete healing, she returned to wearing her previous shoes and the ulcers are re-emerging.  I will implement conservative therapy and recommend that she continue consultation with her podiatrist post discharge. I will not follow, but will remain available as needed to this patient and her medical team.  Please re-consult if needed. Thanks, Ladona Mow, MSN, RN, [facility], CWOCN [phone])

## 2011-04-22 NOTE — Evaluation (Signed)
Clinical/Bedside Swallow Evaluation Patient Details  Name: [name] MRN: [id number] DOB: 09/17/1919 Today's Date: 04/22/2011   Past Medical History:  Past Medical History  Diagnosis Date  . Myocardial infarction   . Coronary artery disease   . Hypertension   . Dysrhythmia   . Peripheral vascular disease   . Hypothyroidism   . Diabetes mellitus    Past Surgical History:  Past Surgical History  Procedure Date  . Cholecystectomy   . Colonoscopy 01/15/2011    Procedure: COLONOSCOPY;  Surgeon: Theda Belfast, MD;  Location: [facility];  Service: Endoscopy;  Laterality: N/A;   HPI:  [name] an 78 yo female adm to [facility] with weakness and shortness of breath.  Pt found to have a UTI and was dehydrated- acute renal injury.  CXR 04/21/11 vascular congestion -mild interstital edema with cardiac enlargement.  Pt with h/o HTN, DM, GERD, sepsis and UTI 05/29/2009.  Pt currently reports problems with swallowing- regurgitation worsening (in incidence and severity) in the last 6 months.     Assessment/Recommendations/Treatment Plan Suspected Esophageal Findings Suspected Esophageal Findings: Belching;Other (comment) (pt with h/o GERD, takes Nexium and Tums)  SLP Assessment Clinical Impression Statement: Pt appears to present with a functional oropharyngeal swallow, Given pt report of symptoms of immediate regurgitation with oatmeal, pt's issues appear consistent with possible progression of questionable zenker's diverticulum seen on MBS 07/2009.  Pt observed drinking water, eating cracker with peanut butter and taking pills (approx 7 at one time with water) without evidence of difficulties.  Pt with empty breakfast tray at bedside (pt consumed oatmeal, juice, and banana).  Regurgtiation of oatmeal again reported today, which pt reports occurs daily.  Pt acknowledges she doesn't eat meat and tolerates soups well without regurgiation.  Recommend consider ordering a dedicated esophageal including  assessment of CP region.  SLP  will follow up x1 for education.   Risk for Aspiration: Moderate (d/t suspected primary esoph issues) Other Related Risk Factors: History of dysphagia;History of GERD;History of pneumonia  Swallow Evaluation Recommendations Recommended Consults: Consider esophageal assessment Diet Recommendations: Regular;Thin liquid Follow up Recommendations: None  Treatment Plan Speech Therapy Frequency: min 1 x/week (for education/tips to maximize airway protection)    Swallowing Goals  SLP Swallowing Goals Patient will utilize recommended strategies during swallow to increase swallowing safety with: Moderate assistance  General  Date of Onset: 04/22/11 HPI: [name] an 78 yo female adm to [facility] with weakness and shortness of breath.  Pt found to have a UTI and was dehydrated- acute renal injury.  CXR 04/21/11 vascular congestion -mild interstital edema with cardiac enlargement.  Pt with h/o HTN, DM, GERD, sepsis and UTI 05/29/2009.  Pt currently reports problems with swallowing- regurgitation worsening (in incidence and severity) in the last 6 months.   Type of Study: Bedside swallow evaluation Previous Swallow Assessment: MBS 08/19/2009 mild oropharyngeal dysphagia, suspected primary esophageal deficits, no aspiration or penetration, pt reports  Diet Prior to this Study: Regular;Thin liquids Temperature Spikes Noted: No Respiratory Status: Supplemental O2 delivered via (comment) Behavior/Cognition: Alert Oral Cavity - Dentition: Adequate natural dentition Vision: Functional for self-feeding Patient Positioning: Upright in bed Baseline Vocal Quality: Clear Volitional Cough: Strong Volitional Swallow: Able to elicit  Oral Motor/Sensory Function  Overall Oral Motor/Sensory Function: Appears within functional limits for tasks assessed  Consistency Results  Ice Chips Ice chips: Not tested  Thin Liquid Thin Liquid: Within functional limits Presentation: Self  Fed;Straw  Nectar Thick Liquid Nectar Thick Liquid: Not tested  Honey  Thick Liquid Honey Thick Liquid: Not tested  Puree Puree: Not tested  Solid Solid: Within functional limits Other Comments: graham cracker with peanut butter:     [name] 04/22/2011,10:20 AM

## 2011-04-22 NOTE — Progress Notes (Signed)
UR complete 

## 2011-04-22 NOTE — Evaluation (Signed)
Physical Therapy Evaluation Patient Details Name: [name] MRN: [id number] DOB: Apr 27, 1919 Today's Date: 04/22/2011  Problem List:  Patient Active Problem List  Diagnoses  . CANDIDIASIS OF OTHER UROGENITAL SITES  . HYPOTHYROIDISM  . DIABETES MELLITUS, TYPE II, UNCONTROLLED  . DIABETIC PERIPHERAL NEUROPATHY  . DYSLIPIDEMIA  . INSOMNIA, CHRONIC  . MENIERE'S DISEASE  . HYPERTENSION, BENIGN ESSENTIAL  . CORONARY ARTERY DISEASE  . ATRIAL FLUTTER  . STASIS DERMATITIS  . ISCHEMIC COLITIS  . BLADDER OUTLET OBSTRUCTION  . PRESSURE ULCER HEEL  . TREMOR  . GAIT DISTURBANCE  . COUGH  . DIARRHEA, CHRONIC  . NOCTURIA  . PELVIC MASS  . FALL, HX OF  . PACEMAKER, PERMANENT  . UTI (lower urinary tract infection)  . Cellulitis and abscess of leg, except foot  . Dehydration  . Toxic encephalopathy  . Hypothyroidism  . Acute renal injury  . Wound eschar of foot  . CAD (coronary artery disease)  . History of atrial flutter  . DM (diabetes mellitus) type I uncontrolled with renal manifestation  . Hyponatremia  . Hyperkalemia  . Stage III chronic kidney disease  . Normocytic anemia  . Dysphasia    Past Medical History:  Past Medical History  Diagnosis Date  . Myocardial infarction   . Coronary artery disease   . Hypertension   . Dysrhythmia   . Peripheral vascular disease   . Hypothyroidism   . Diabetes mellitus    Past Surgical History:  Past Surgical History  Procedure Date  . Cholecystectomy   . Colonoscopy 01/15/2011    Procedure: COLONOSCOPY;  Surgeon: Theda Belfast, MD;  Location: [facility];  Service: Endoscopy;  Laterality: N/A;    PT Assessment/Plan/Recommendation PT Assessment Clinical Impression Statement: Pt admitted for UTI.  Pt would benefit from acute PT services in order to improve independence with transfers and ambulation as well as increase activity tolerance.  Pt reports she would not feel comfortable going home alone if d/c today and would like  [facility] for SNF prior to d/c home.  Pt SaO2 95% room air upon arrival and 93% post ambulation on room air. PT Recommendation/Assessment: Patient will need skilled PT in the acute care venue PT Problem List: Decreased activity tolerance;Decreased mobility;Decreased knowledge of use of DME Barriers to Discharge: Decreased caregiver support PT Plan PT Frequency: Min 3X/week PT Treatment/Interventions: DME instruction;Gait training;Functional mobility training;Therapeutic activities;Therapeutic exercise;Patient/family education PT Recommendation Follow Up Recommendations: Skilled nursing facility (may progress to HHPT) Equipment Recommended: None recommended by PT PT Goals  Acute Rehab PT Goals PT Goal Formulation: With patient Time For Goal Achievement: 7 days Pt will go Supine/Side to Sit: with supervision PT Goal: Supine/Side to Sit - Progress: Goal set today Pt will go Sit to Stand: with supervision PT Goal: Sit to Stand - Progress: Goal set today Pt will go Stand to Sit: with supervision PT Goal: Stand to Sit - Progress: Goal set today Pt will Ambulate: >150 feet;with supervision;with least restrictive assistive device PT Goal: Ambulate - Progress: Goal set today Pt will Perform Home Exercise Program: with supervision, verbal cues required/provided PT Goal: Perform Home Exercise Program - Progress: Goal set today  PT Evaluation Precautions/Restrictions  Precautions Precautions: Fall Prior Functioning  Home Living Lives With: Alone Receives Help From: Other (Comment) (4 hr a day 5 days a week pt has private assist) Type of Home: House Home Layout: One level Home Access: Stairs to enter Entrance Stairs-Rails: Can reach both Entrance Stairs-Number of Steps: 3 Home  Adaptive Equipment: Other (comment);Walker - rolling (rollator) Prior Function Level of Independence: Needs assistance with homemaking;Independent with basic ADLs;Requires assistive device for  independence Cognition Cognition Arousal/Alertness: Awake/alert Overall Cognitive Status: Appears within functional limits for tasks assessed Sensation/Coordination   Extremity Assessment RLE Assessment RLE Assessment: Within Functional Limits (per observation) LLE Assessment LLE Assessment: Within Functional Limits (per observation) Mobility (including Balance) Bed Mobility Bed Mobility: Yes (pt up in recliner on arrival) Sit to Supine: HOB flat;4: Min assist Sit to Supine - Details (indicate cue type and reason): slight assist for LEs onto bed Transfers Transfers: Yes Sit to Stand: 4: Min assist;With upper extremity assist;From chair/3-in-1 Sit to Stand Details (indicate cue type and reason): min/guard, verbal cue for hand placement Stand to Sit: 4: Min assist;With upper extremity assist;To chair/3-in-1;To bed Stand to Sit Details: assist for uncontrolled descent Ambulation/Gait Ambulation/Gait: Yes Ambulation/Gait Assistance: 4: Min assist Ambulation/Gait Assistance Details (indicate cue type and reason): min/guard, verbal cues for posture Ambulation Distance (Feet): 160 Feet Assistive device: Rolling walker Gait Pattern: Step-through pattern;Trunk flexed Gait velocity: slow cadence    Exercise    End of Session PT - End of Session Equipment Utilized During Treatment: Gait belt Activity Tolerance: Patient tolerated treatment well Patient left: in bed;with call bell in reach General Behavior During Session: [facility] for tasks performed Cognition: [facility] for tasks performed  Brice Kossman,[name] 04/22/2011, 12:30 PM Pager: [phone]

## 2011-04-22 NOTE — Progress Notes (Signed)
INITIAL ADULT NUTRITION ASSESSMENT Date: 04/22/2011   Time: 3:32 PM Reason for Assessment: Screened for nutrition risk, dysphagia  ASSESSMENT: Female 78 y.o.  Dx: UTI (lower urinary tract infection)  Hx:  Past Medical History  Diagnosis Date  . Myocardial infarction   . Coronary artery disease   . Hypertension   . Dysrhythmia   . Peripheral vascular disease   . Hypothyroidism   . Diabetes mellitus     Related Meds:  Scheduled Meds:   . calcium carbonate  1 tablet Oral Daily  . carvedilol  6.25 mg Oral BID WC  . cefTRIAXone (ROCEPHIN)  IV  1 g Intravenous Once  . enoxaparin  30 mg Subcutaneous Q24H  . gabapentin  300 mg Oral Daily  . insulin aspart  0-15 Units Subcutaneous TID WC  . insulin aspart  0-5 Units Subcutaneous QHS  . insulin glargine  30 Units Subcutaneous QHS  . isosorbide mononitrate  30 mg Oral Daily  . levothyroxine  200 mcg Oral QAC breakfast  . levothyroxine  25 mcg Oral QAC breakfast  . mulitivitamin with minerals  1 tablet Oral Daily  . oxybutynin  10 mg Oral QHS  . pantoprazole (PROTONIX) IV  40 mg Intravenous Q24H  . piperacillin-tazobactam (ZOSYN)  IV  3.375 g Intravenous Q8H  . Tamsulosin HCl  0.4 mg Oral Daily  . traZODone  50 mg Oral QHS  . vancomycin  750 mg Intravenous Q24H  . vitamin B-12  1,000 mcg Oral Daily  . vitamin E  100 Units Oral Daily  . DISCONTD: insulin glargine  20 Units Subcutaneous QHS   Continuous Infusions:   . sodium chloride 50 mL/hr at 04/21/11 2250  . DISCONTD: sodium chloride 125 mL/hr at 04/21/11 2029  . DISCONTD: sodium chloride    . DISCONTD: sodium chloride     PRN Meds:.acetaminophen, acetaminophen, HYDROmorphone, ondansetron (ZOFRAN) IV, ondansetron   Ht: 5\' 8"  (172.7 cm)  Wt: 176 lb 9.4 oz (80.1 kg)  Ideal Wt: 63.63 kg % Ideal Wt: 125.7%  Usual Wt: 166 lb. % Usual Wt: 106%  Body mass index is 26.85 kg/(m^2).  Food/Nutrition Related Hx: Patient reported appetite poor. Patient on carb modified  diet with PO intake documented 100%. Patietn reported swallowing difficulties. Noted patient with wound, L.E. Cellulitis.   Labs:  CMP     Component Value Date/Time   NA 133* 04/22/2011 0506   K 3.8 04/22/2011 0506   CL 96 04/22/2011 0506   CO2 28 04/22/2011 0506   GLUCOSE 235* 04/22/2011 0506   BUN 38* 04/22/2011 0506   CREATININE 2.22* 04/22/2011 0506   CALCIUM 8.4 04/22/2011 0506   PROT 6.1 04/22/2011 0506   ALBUMIN 2.5* 04/22/2011 0506   AST 17 04/22/2011 0506   ALT 11 04/22/2011 0506   ALKPHOS 74 04/22/2011 0506   BILITOT 0.3 04/22/2011 0506   GFRNONAA 18* 04/22/2011 0506   GFRAA 21* 04/22/2011 0506    Intake/Output Summary (Last 24 hours) at 04/22/11 1536 Last data filed at 04/22/11 0900  Gross per 24 hour  Intake 732.33 ml  Output   1350 ml  Net -617.67 ml     Diet Order: Carb Control  Supplements/Tube Feeding: none at this time  IVF:    sodium chloride Last Rate: 50 mL/hr at 04/21/11 2250  DISCONTD: sodium chloride Last Rate: 125 mL/hr at 04/21/11 2029  DISCONTD: sodium chloride   DISCONTD: sodium chloride     Estimated Nutritional Needs:   Kcal: 1360-1760 Protein: 96-120 grams Fluid:  1 ml per kcal   NUTRITION DIAGNOSIS: -Increased nutrient needs  -Swallowing difficulty. Status: Ongoing  Related to foods get stuck in the throat as evidenced by patient reports foods get stuck in the throat unpon swallowing.   RELATED TO: wound healing  AS EVIDENCE BY: Partial thickness wound to L.E.   MONITORING/EVALUATION(Goals): Skin, PO intake, Labs, Weight trends 1. PO intake >75% at meals and supplements. 2. Ease of chewing upon down grade of diet to dysphagia 3.   EDUCATION NEEDS: -No education needs identified at this time  INTERVENTION: 1. Will down grade diet to dysphagia 3 carb modified diet. 2. Will order glucerna once daily. Provides 220 kcal and 909 grams of protein daily.  3. RD to follow for nutrition needs.   Dietitian [phone]  DOCUMENTATION  CODES Per approved criteria  -Not Applicable    Iven Finn [facility] 04/22/2011, 3:32 PM

## 2011-04-22 NOTE — Progress Notes (Signed)
Inpatient Diabetes Program Recommendations  AACE/ADA: New Consensus Statement on Inpatient Glycemic Control (2009)  Target Ranges:  Prepandial:   less than 140 mg/dL      Peak postprandial:   less than 180 mg/dL (1-2 hours)      Critically ill patients:  140 - 180 mg/dL   Reason for Visit: Hyperglycemia  Results for [name], [name] (MRN [id number]) as of 04/22/2011 12:02  Ref. Range 04/21/2011 23:05  Hemoglobin A1C Latest Range: <5.7 % 8.0 (H)  Results for [name], MANTIA (MRN [id number]) as of 04/22/2011 12:02  Ref. Range 01/15/2011 11:47 04/21/2011 13:38 04/21/2011 21:45 04/22/2011 07:48 04/22/2011 11:57  Glucose-Capillary Latest Range: 70-99 mg/dL 782 (H) 956 (H) 213 (H) 206 (H) 319 (H)    Inpatient Diabetes Program Recommendations Insulin - Basal: Titrate Lantus until FBS > 140 mg/dL.  Home dose is Lantus 42 units QHS Insulin - Meal Coverage: Add meal coverage insulin - Novolog 4 units tidwc  Note: Will follow.

## 2011-04-22 NOTE — Evaluation (Signed)
Occupational Therapy Evaluation Patient Details Name: [name] MRN: [id number] DOB: [birth year] Today's Date: 04/22/2011 8:27-9:09  evII Problem List:  Patient Active Problem List  Diagnoses  . CANDIDIASIS OF OTHER UROGENITAL SITES  . HYPOTHYROIDISM  . DIABETES MELLITUS, TYPE II, UNCONTROLLED  . DIABETIC PERIPHERAL NEUROPATHY  . DYSLIPIDEMIA  . INSOMNIA, CHRONIC  . MENIERE'S DISEASE  . HYPERTENSION, BENIGN ESSENTIAL  . CORONARY ARTERY DISEASE  . ATRIAL FLUTTER  . STASIS DERMATITIS  . ISCHEMIC COLITIS  . BLADDER OUTLET OBSTRUCTION  . PRESSURE ULCER HEEL  . TREMOR  . GAIT DISTURBANCE  . COUGH  . DIARRHEA, CHRONIC  . NOCTURIA  . PELVIC MASS  . FALL, HX OF  . PACEMAKER, PERMANENT  . UTI (lower urinary tract infection)  . Cellulitis and abscess of leg, except foot  . Dehydration  . Toxic encephalopathy  . Hypothyroidism  . Acute renal injury  . Wound eschar of foot  . CAD (coronary artery disease)  . History of atrial flutter  . DM (diabetes mellitus) type I uncontrolled with renal manifestation  . Hyponatremia  . Hyperkalemia  . Stage III chronic kidney disease  . Normocytic anemia  . Dysphasia    Past Medical History:  Past Medical History  Diagnosis Date  . Myocardial infarction   . Coronary artery disease   . Hypertension   . Dysrhythmia   . Peripheral vascular disease   . Hypothyroidism   . Diabetes mellitus    Past Surgical History:  Past Surgical History  Procedure Date  . Cholecystectomy   . Colonoscopy 01/15/2011    Procedure: COLONOSCOPY;  Surgeon: Theda Belfast, MD;  Location: [facility];  Service: Endoscopy;  Laterality: N/A;    OT Assessment/Plan/Recommendation OT Assessment Clinical Impression Statement: Pleasant 78 yr old female admitted with confusion and UTI.  Confusion now resolved but presents with increased fatigue and decreased balance for functional selfcare tasks.  Feel she will benefit from acute OT services to address  these issues in order to increase independence for return home.  Feel she will benefit from short term SNF for further rehab prior to retrun home alone at a modified independent. OT Recommendation/Assessment: Patient will need skilled OT in the acute care venue OT Problem List: Decreased strength;Decreased activity tolerance;Impaired balance (sitting and/or standing) OT Therapy Diagnosis : Generalized weakness OT Plan OT Frequency: Min 1X/week OT Treatment/Interventions: Self-care/ADL training;Therapeutic activities;DME and/or AE instruction;Balance training;Patient/family education OT Recommendation Follow Up Recommendations: Skilled nursing facility Equipment Recommended: Defer to next venue Individuals Consulted Consulted and Agree with Results and Recommendations: Patient OT Goals Acute Rehab OT Goals OT Goal Formulation: With patient Time For Goal Achievement: 2 weeks ADL Goals Pt Will Perform Grooming: with supervision;Standing at sink ADL Goal: Grooming - Progress: Goal set today Pt Will Perform Lower Body Bathing: with supervision;Sit to stand from bed;Sit to stand from chair ADL Goal: Lower Body Bathing - Progress: Goal set today Pt Will Perform Lower Body Dressing: Sit to stand from bed;with supervision ADL Goal: Lower Body Dressing - Progress: Goal set today Pt Will Transfer to Toilet: with supervision;with DME;3-in-1 ADL Goal: Toilet Transfer - Progress: Goal set today Pt Will Perform Toileting - Clothing Manipulation: with supervision;Sitting on 3-in-1 or toilet;Standing ADL Goal: Toileting - Clothing Manipulation - Progress: Goal set today Pt Will Perform Toileting - Hygiene: with supervision;Sit to stand from 3-in-1/toilet ADL Goal: Toileting - Hygiene - Progress: Goal set today Miscellaneous OT Goals Miscellaneous OT Goal #1: Pt will tolerate at least 4 mins of  standing during grooming tasks without the need for rest break. OT Goal: Miscellaneous Goal #1 - Progress:  Goal set today  OT Evaluation Precautions/Restrictions  Precautions Precautions: Fall Required Braces or Orthoses: No Restrictions Weight Bearing Restrictions: No Prior Functioning Home Living Lives With: Alone Receives Help From: Other (Comment) (4 hr a day 5 days a week pt has private assist) Type of Home: House Home Layout: One level Home Access: Stairs to enter Entrance Stairs-Rails: Can reach both Entrance Stairs-Number of Steps: 3 Bathroom Shower/Tub: Health visitor: Standard Home Adaptive Equipment: Other (comment);Walker - rolling;Bedside commode/3-in-1 (rollator) Prior Function Level of Independence: Needs assistance with homemaking;Independent with basic ADLs;Requires assistive device for independence Vocation: Retired ADL ADL Eating/Feeding: Performed;Independent Where Assessed - Eating/Feeding: Bed level Grooming: Performed;Wash/dry hands;Minimal assistance Where Assessed - Grooming: Standing at sink Upper Body Bathing: Simulated;Set up Where Assessed - Upper Body Bathing: Sitting, bed;Unsupported Lower Body Bathing: Simulated Where Assessed - Lower Body Bathing: Sit to stand from bed Upper Body Dressing: Minimal assistance Where Assessed - Upper Body Dressing: Sitting, bed;Unsupported Lower Body Dressing: Simulated;Minimal assistance Where Assessed - Lower Body Dressing: Sit to stand from bed Toilet Transfer: Performed;Minimal assistance Toilet Transfer Method: Ambulating Toilet Transfer Equipment: Comfort height toilet;Grab bars Toileting - Clothing Manipulation: Performed;Minimal assistance Where Assessed - Toileting Clothing Manipulation: Sit to stand from 3-in-1 or toilet Toileting - Hygiene: Performed;Minimal assistance Where Assessed - Toileting Hygiene: Sit to stand from 3-in-1 or toilet Tub/Shower Transfer: Not assessed Tub/Shower Transfer Method: Not assessed Ambulation Related to ADLs: Pt required min assist for ambulation to and  from the bathroom for toileting and grooming tasks at the sink. ADL Comments: Pt with decreased dynamic standing balance for ADLs.  Needs overall min assist for safety.  Pt also with increased fatigue and slight dyspnea with activity as well.  O2 sats decreased to 87 % after ambulation to the bathroom and return to bedside chair. Vision/Perception  Vision - History Baseline Vision: Wears glasses all the time Patient Visual Report: No change from baseline Perception Perception: Within Functional Limits Praxis Praxis: Intact Cognition Cognition Arousal/Alertness: Awake/alert Overall Cognitive Status: Appears within functional limits for tasks assessed Sensation/Coordination Sensation Light Touch: Appears Intact (For bilateral UEs) Stereognosis: Not tested Hot/Cold: Not tested Proprioception: Not tested Coordination Gross Motor Movements are Fluid and Coordinated: Yes Fine Motor Movements are Fluid and Coordinated: Yes Extremity Assessment RUE Assessment RUE Assessment: Within Functional Limits LUE Assessment LUE Assessment: Within Functional Limits Mobility  Bed Mobility Bed Mobility: Yes Supine to Sit: HOB elevated (Comment degrees);5: Supervision Supine to Sit Details (indicate cue type and reason): Increased time to transition to sitting. Sit to Supine: HOB flat;4: Min assist Sit to Supine - Details (indicate cue type and reason): slight assist for LEs onto bed Transfers Transfers: Yes Sit to Stand: 4: Min assist;With upper extremity assist;From bed;From toilet Sit to Stand Details (indicate cue type and reason): min/guard, verbal cue for hand placement Stand to Sit: 4: Min assist;With upper extremity assist;To chair/3-in-1;To bed Stand to Sit Details: assist for uncontrolled descent  End of Session OT - End of Session Activity Tolerance: Patient limited by fatigue Patient left: in chair;with call bell in reach General Behavior During Session: [facility] for tasks  performed Cognition: [facility] for tasks performed   Tasneem Cormier OTR/L 04/22/2011, 12:57 PM  Pager number [phone]

## 2011-04-23 ENCOUNTER — Encounter (HOSPITAL_COMMUNITY): Payer: Self-pay | Admitting: Internal Medicine

## 2011-04-23 DIAGNOSIS — R339 Retention of urine, unspecified: Secondary | ICD-10-CM

## 2011-04-23 DIAGNOSIS — K2289 Other specified disease of esophagus: Secondary | ICD-10-CM | POA: Diagnosis present

## 2011-04-23 DIAGNOSIS — K228 Other specified diseases of esophagus: Secondary | ICD-10-CM | POA: Diagnosis present

## 2011-04-23 HISTORY — DX: Other specified disease of esophagus: K22.89

## 2011-04-23 HISTORY — DX: Retention of urine, unspecified: R33.9

## 2011-04-23 LAB — BASIC METABOLIC PANEL
BUN: 35 mg/dL — ABNORMAL HIGH (ref 6–23)
CO2: 28 mEq/L (ref 19–32)
Chloride: 94 mEq/L — ABNORMAL LOW (ref 96–112)
Creatinine, Ser: 1.97 mg/dL — ABNORMAL HIGH (ref 0.50–1.10)
Glucose, Bld: 294 mg/dL — ABNORMAL HIGH (ref 70–99)
Potassium: 3.9 mEq/L (ref 3.5–5.1)

## 2011-04-23 LAB — URINE CULTURE: Colony Count: >=100000

## 2011-04-23 LAB — GLUCOSE, CAPILLARY
Glucose-Capillary: 316 mg/dL — ABNORMAL HIGH (ref 70–99)
Glucose-Capillary: 164 mg/dL — ABNORMAL HIGH (ref 70–99)
Glucose-Capillary: 176 mg/dL — ABNORMAL HIGH (ref 70–99)

## 2011-04-23 LAB — CBC
HCT: 30.9 % — ABNORMAL LOW (ref 36.0–46.0)
Hemoglobin: 10 g/dL — ABNORMAL LOW (ref 12.0–15.0)
MCH: 28.5 pg (ref 26.0–34.0)
MCHC: 32.4 g/dL (ref 30.0–36.0)
MCV: 88 fL (ref 78.0–100.0)
RDW: 13.6 % (ref 11.5–15.5)

## 2011-04-23 MED ORDER — FLORA-Q PO CAPS
1.0000 | ORAL_CAPSULE | Freq: Every day | ORAL | Status: DC
  Administered 2011-04-23 – 2011-04-27 (×5): 1 via ORAL
  Filled 2011-04-23 (×6): qty 1

## 2011-04-23 MED ORDER — PAREGORIC 2 MG/5ML PO TINC: 10.0000 mL | Freq: Four times a day (QID) | ORAL | Status: DC | PRN

## 2011-04-23 MED ORDER — LEVOTHYROXINE SODIUM 25 MCG PO TABS
225.0000 ug | ORAL_TABLET | Freq: Every day | ORAL | Status: DC
  Administered 2011-04-23 – 2011-04-27 (×5): 225 ug via ORAL
  Filled 2011-04-23 (×5): qty 1

## 2011-04-23 MED ORDER — INSULIN ASPART 100 UNIT/ML ~~LOC~~ SOLN
0.0000 [IU] | Freq: Every day | SUBCUTANEOUS | Status: DC
  Administered 2011-04-24: 5 [IU] via SUBCUTANEOUS
  Administered 2011-04-26: 3 [IU] via SUBCUTANEOUS

## 2011-04-23 MED ORDER — INSULIN ASPART 100 UNIT/ML ~~LOC~~ SOLN
4.0000 [IU] | Freq: Three times a day (TID) | SUBCUTANEOUS | Status: DC
  Administered 2011-04-23 (×3): 4 [IU] via SUBCUTANEOUS

## 2011-04-23 MED ORDER — INSULIN GLARGINE 100 UNIT/ML ~~LOC~~ SOLN
42.0000 [IU] | Freq: Every day | SUBCUTANEOUS | Status: DC
  Administered 2011-04-23: 42 [IU] via SUBCUTANEOUS

## 2011-04-23 MED ORDER — DEXTROSE 5 % IV SOLN
1.0000 g | INTRAVENOUS | Status: DC
  Administered 2011-04-23 – 2011-04-24 (×2): 1 g via INTRAVENOUS
  Filled 2011-04-23 (×3): qty 10

## 2011-04-23 MED ORDER — INSULIN ASPART 100 UNIT/ML ~~LOC~~ SOLN
0.0000 [IU] | Freq: Three times a day (TID) | SUBCUTANEOUS | Status: DC
  Administered 2011-04-23: 11 [IU] via SUBCUTANEOUS
  Administered 2011-04-23: 8 [IU] via SUBCUTANEOUS
  Administered 2011-04-23 – 2011-04-24 (×2): 3 [IU] via SUBCUTANEOUS
  Administered 2011-04-24: 5 [IU] via SUBCUTANEOUS
  Administered 2011-04-24: 2 [IU] via SUBCUTANEOUS
  Administered 2011-04-25: 5 [IU] via SUBCUTANEOUS
  Administered 2011-04-25 (×2): 8 [IU] via SUBCUTANEOUS
  Administered 2011-04-26 – 2011-04-27 (×3): 5 [IU] via SUBCUTANEOUS
  Administered 2011-04-27: 3 [IU] via SUBCUTANEOUS

## 2011-04-23 NOTE — Progress Notes (Signed)
CSW spoke with patient re: discharge planning. Patient states that she has been to [facility] in the past and would like to return there for rehab. CSW completed FL2 and faxed information to [facility], made [place] [facility] [place] [facility] aware. Anticipating discharge Monday.   Patient's primary contact is her daughterAnnice Pih (home#: [phone] cell#: [phone])  Unice Bailey, LCSWA [phone]

## 2011-04-23 NOTE — Progress Notes (Signed)
Speech Language Pathology Dysphagia Treatment  Patient Details Name: [name] MRN: [id number] DOB: 1919-06-01 Today's Date: 04/23/2011 No pain voiced or evident.   SLP Assessment/Plan/Recommendation Assessment / Recommendations / Plan Clinical Impression Statement: Pt presented with overall functional oral pharyngeal swallow today with PO's (water and crackers) and while taking pills with RN (took all pills at once with water, no overt difficulty.)  Pt had esophagram yesterday afternoon. Results indicated:  No stricture, mass or evidence inflammatory change is identified in the esophagus. There were some tertiary contractions. Impression was mild presbyesophagus. Pt did continue to have belching during PO today and stating regurgitation was less at meal this am than yesterday.                                                                                                                                                                                                                                                                      Continue with Current Diet: Dysphagia 3 (mechanical soft);Thin liquid Compensations: Slow rate;Small sips/bites Postural Changes and/or Swallow Maneuvers: Seated upright 90 degrees;Upright 30-60 min after meal Oral Care Recommendations: Oral care BID Plan: Continue with current plan of care Swallowing Goals  SLP Swallowing Goals Patient will utilize recommended strategies during swallow to increase swallowing safety with: Moderate assistance Swallow Study Goal #2 - Progress: Progressing toward goal  General Temperature Spikes Noted: Yes (slight spike, Tmax 99.5) Respiratory Status: Supplemental O2 delivered via (comment) (95% on 2l [place]; exp. wheezes ) Behavior/Cognition: Alert;Cooperative (states she is tired) Oral Cavity - Dentition: Dentures, top;Dentures, bottom (c/o dentures not fitting. Pt called daughter, bring paste   ) Patient Positioning: Upright in  bed  Oral Cavity - Oral Hygiene Does patient have any of the following "at risk" factors?: Oxygen therapy - cannula, mask, simple oxygen devices;Lips - dry, cracked   Dysphagia Treatment Treatment focused on: Skilled observation of diet tolerance;Patient/family/caregiver education Family/Caregiver Educated: Pt educated regarding upright position for PO's  Treatment Methods/Modalities: Skilled observation;Differential diagnosis Patient observed directly with PO's: Yes Type of PO's observed: Dysphagia 3 (soft);Thin liquids Feeding: Able to feed self Liquids provided via: Cup;Straw Type of cueing: Verbal (to slow rate while drinking liquids ) Amount of cueing: Minimal   [name] 04/23/2011, 11:08  AM

## 2011-04-23 NOTE — Progress Notes (Signed)
PROGRESS NOTE  [name] ZOX:[id number] DOB: [birth year] DOA: 04/21/2011 PCP: [name], MD, MD  Brief narrative: [name] is an 78 year old female with a past medical history of coronary artery disease, diabetes, atrial flutter, hypertension, dyslipidemia, hypothyroidism, and ischemic colitis who presented to the hospital on 04/21/2011 with nausea, vomiting, and feeling unwell. Upon initial evaluation emergency department, the patient was found to have evidence of both a urinary tract infection and right lower extremity cellulitis. She was also dehydrated. She was placed on broad-spectrum antibiotic therapy, which is being narrowed today.  Assessment/Plan: Principal Problem:  *UTI (lower urinary tract infection) / Urinary retention Patient was admitted and a urinalysis was done which showed a large amount of leukocytes and too numerous to count white blood cells on microscopy. There were also 7-10 red blood cells and a few bacteria. Nitrites were negative. The patient had urinary retention on admission, as well, prompting [place] to place a foley catheter.  She reports that she has an outpatient appointment with a urologist because she has this issue chronically, which likely precipitated her UTI.  Cultures growing E. Coli.  Will narrow antibiotics to Rocephin.   Active Problems:  Cellulitis and abscess of leg, except foot / wound eschar of foot The patient does have some asymmetric erythema of her right lower extremity consistent with possible cellulitis. She was initially placed on broad-spectrum antibiotics as noted. Given her diarrhea, and the low suspicion for clinically significant cellulitis, will narrow antibiotics.    Dehydration / Acute renal injury /  Stage III chronic kidney disease The patient has been placed on IV fluids and her diuretics have been placed on hold. Her creatinine has come down with IV fluid hydration. The patient does have significant renal impairment with her GFR ranging  from 18 up to 45. This corresponds with stage III-4 chronic kidney disease. We will continue to hydrate her and monitor her renal function until it is back to baseline. Her baseline creatinine is as low as 1.14 (01/23/10) but most recently, her creatinine was 1.60 on 04/10/2011. Her underlying chronic kidney disease is likely secondary to diabetic nephropathy.  Altered mental status / toxic encephalopathy Patient likely suffered from toxic encephalopathy related to infections. This point, her mental status is clear.  Hypothyroidism The patient's TSH was checked and was found to be within normal limits. Continue usual dose of Synthroid.  CAD (coronary artery disease) Patient denies chest pain though she is having palpitations. One troponin was checked in the emergency department, and it was negative. Continue Coreg, Imdur.  History of atrial flutter Patient is status post ablation and is on rate controlling medications. Her 12-lead EKG showed normal sinus rhythm. Patient has subjective palpitations but her heart sounds regular on exam. Continue Coreg, Cardizem. TSH was normal.  DM (diabetes mellitus) type I uncontrolled with renal manifestation Continue insulin therapy. CBGs 206-381 over the past 24 hours, despite increase in Lantus to 30 units nightly.  Home dose is 42 units, so will increase to home dose.  Add 4 units of Novolog meal coverage per DM coordinator recommendations.  Hyponatremia May be from hypothyroidism versus pseudo-hyponatremia from elevated blood glucoses. Mild. We'll continue to monitor.  Hyperkalemia Likely from acute kidney injury in the setting of advanced chronic kidney disease. Patient's potassium has normalized with hydration and improvement in her renal function.  Normocytic anemia Likely anemia of chronic kidney disease. Hemoglobin is stable.  Dysphasia secondary to presbyesophagus Patient complains of subacute regurgitation of food consistent with dysphasia. We  asked  speech therapy to evaluate her and they recommended an esophogram, which was done on 04/22/11 and showed mild presbyesophagus.  Continue dysphagia 3 diet.  Questionable pulmonary edema on chest radiography Patient does not have any clinical signs suggestive of decompensated heart failure. A two-dimensional echocardiogram was ordered by the admitting physician. Patient's last echocardiogram was done on 06/12/2008 and had a normal EF.  No further evidence of CHF.  No dyspnea or findings on lung exam to suggest CHF.  Will d/c the Echo.  Chronic diarrhea The patient normally takes paregoric for this.  It was stopped on admission.  Stool PCR for clostridium difficile was ordered, but she reports having a formed stool today.  Will re-order paregoric PRN.   Code Status: Full Family Communication: Daughter Annice Pih  Tel: [phone] and Tel: [phone] Disposition Plan: Home, when stable  Consultants:  Wound care nurse: F/U podiatrist post discharge for toe ulcer  Dietician: Dysphagia 3 diet for swallowing difficulty  Speech therapy: Esophogram ordered per recs.  Physical therapy: SNF vs HHPT if improves  Occupational therapy  Diabetes Coordinator: Titrate basal insulin until FBS > 140.  Procedures:  12-lead EKG 04/21/2011: SINUS RHYTHM ~ normal P axis, V-rate 50- 99  Antibiotics:  Zosyn 04/21/11--->04/23/11  Vancomycin 04/21/11--->04/23/11  Rocephin 04/21/2011---> 04/21/2011, 04/23/11   Subjective  [name] reports some diarrhea yesterday, but had a formed stool today.  No nausea or vomiting today.  No dyspnea or chest discomfort.  Agreeable to SNF for rehabilitation.     Objective    Interim History: Developed diarrhea overnight.  C. Diff for PCR ordered by cross covering M.D.     Objective: Filed Vitals:   04/22/11 0830 04/22/11 1415 04/22/11 2122 04/23/11 0500  BP:  134/65 137/70 154/71  Pulse:  78 74 79  Temp:  98.6 F (37 C) 98.4 F (36.9 C) 99.5 F (37.5 C)    TempSrc:  Oral Oral Oral  Resp:  20 18 18   Height:      Weight:      SpO2: 96% 92% 95% 95%    Intake/Output Summary (Last 24 hours) at 04/23/11 0715 Last data filed at 04/23/11 2130  Gross per 24 hour  Intake 1832.5 ml  Output   1650 ml  Net  182.5 ml    Exam: Gen:  NAD Cardiovascular:  RRR, No M/R/G Respiratory: Lungs CTAB Gastrointestinal: Abdomen soft, NT/ND with normal active bowel sounds. Extremities: 1+ edema bilaterally. Mild bilateral erythema.  Very tiny ulcer right great toe with scab  Data Reviewed: Basic Metabolic Panel:  Lab 04/23/11 8657 04/22/11 0506 04/21/11 2305 04/21/11 1341  NA 131* 133* -- 131*  K 3.9 3.8 -- --  CL 94* 96 -- 92*  CO2 28 28 -- 29  GLUCOSE 294* 235* -- 232*  BUN 35* 38* -- 33*  CREATININE 1.97* 2.22* 2.07* 1.85*  CALCIUM 8.2* 8.4 -- 8.9  MG -- -- -- --  PHOS -- -- -- --   GFR Estimated Creatinine Clearance: 21.5 ml/min (by C-G formula based on Cr of 1.97). Liver Function Tests:  Lab 04/22/11 0506  AST 17  ALT 11  ALKPHOS 74  BILITOT 0.3  PROT 6.1  ALBUMIN 2.5*    CBC:  Lab 04/23/11 0505 04/22/11 0506 04/21/11 2305  WBC 16.1* 19.5* 18.6*  NEUTROABS -- -- --  HGB 10.0* 10.7* 11.8*  HCT 30.9* 33.0* 36.4  MCV 88.0 88.2 88.3  PLT 207 208 233   Cardiac Enzymes:  Lab 04/21/11 1341  CKTOTAL --  CKMB --  CKMBINDEX --  TROPONINI <0.30   CBG:  Lab 04/23/11 0517 04/22/11 2127 04/22/11 1709 04/22/11 1157 04/22/11 0748  GLUCAP 260* 381* 298* 319* 206*   Hgb A1c  Basename 04/21/11 2305  HGBA1C 8.0*   Thyroid function studies  Basename 04/21/11 2305  TSH 2.844  T4TOTAL --  T3FREE --  THYROIDAB --   Microbiology Recent Results (from the past 240 hour(s))  URINE CULTURE     Status: Normal (Preliminary result)   Collection Time   04/21/11  5:52 PM      Component Value Range Status Comment   Specimen Description URINE, CATHETERIZED   Final    Special Requests NONE   Final    Culture  Setup Time [id number]    Final    Colony Count >=100,000 COLONIES/ML   Final    Culture ESCHERICHIA COLI   Final    Report Status PENDING   Incomplete     Studies:  Dg Chest Port 1 View 04/21/2011  *RADIOLOGY REPORT*  Clinical Data: Weakness and shortness of breath.  PORTABLE CHEST - 1 VIEW  Comparison: Fourth 32,011.  Findings: The heart is mildly enlarged but stable.  There is tortuosity and calcification of the thoracic aorta.  There is moderate vascular congestion and possible mild interstitial edema. No definite pleural effusions or focal infiltrates.  The bony thorax is intact.  Remote AVN involving the right humeral head.  IMPRESSION: Cardiac enlargement with vascular congestion and probable mild interstitial edema.  Original Report Authenticated By: P. Loralie Champagne, M.D.    Dg Esophagus 04/22/2011  IMPRESSION: Mild presbyesophagus.  Negative for stricture or focal abnormality.  Original Report Authenticated By: Bernadene Bell. D'ALESSIO, M.D.     Scheduled Meds:    . calcium carbonate  1 tablet Oral Daily  . carvedilol  6.25 mg Oral BID WC  . enoxaparin  30 mg Subcutaneous Q24H  . feeding supplement  237 mL Oral QPC supper  . gabapentin  300 mg Oral Daily  . insulin aspart  0-15 Units Subcutaneous TID WC  . insulin aspart  0-5 Units Subcutaneous QHS  . insulin glargine  30 Units Subcutaneous QHS  . isosorbide mononitrate  30 mg Oral Daily  . levothyroxine  200 mcg Oral QAC breakfast  . levothyroxine  25 mcg Oral QAC breakfast  . mulitivitamin with minerals  1 tablet Oral Daily  . oxybutynin  10 mg Oral QHS  . pantoprazole (PROTONIX) IV  40 mg Intravenous Q24H  . piperacillin-tazobactam (ZOSYN)  IV  3.375 g Intravenous Q8H  . Tamsulosin HCl  0.4 mg Oral Daily  . traZODone  50 mg Oral QHS  . vancomycin  750 mg Intravenous Q24H  . vitamin B-12  1,000 mcg Oral Daily  . vitamin E  100 Units Oral Daily  . DISCONTD: insulin glargine  20 Units Subcutaneous QHS   Continuous Infusions:    . sodium  chloride 50 mL/hr at 04/23/11 0333      LOS: 2 days   Hillery Aldo, MD Pager [phone]  04/23/2011, 7:15 AM

## 2011-04-24 ENCOUNTER — Inpatient Hospital Stay (HOSPITAL_COMMUNITY): Payer: Medicare Other

## 2011-04-24 DIAGNOSIS — R0902 Hypoxemia: Secondary | ICD-10-CM | POA: Diagnosis present

## 2011-04-24 DIAGNOSIS — T782XXA Anaphylactic shock, unspecified, initial encounter: Secondary | ICD-10-CM

## 2011-04-24 DIAGNOSIS — I10 Essential (primary) hypertension: Secondary | ICD-10-CM | POA: Diagnosis present

## 2011-04-24 DIAGNOSIS — N390 Urinary tract infection, site not specified: Principal | ICD-10-CM

## 2011-04-24 DIAGNOSIS — L03119 Cellulitis of unspecified part of limb: Secondary | ICD-10-CM

## 2011-04-24 DIAGNOSIS — R062 Wheezing: Secondary | ICD-10-CM | POA: Diagnosis present

## 2011-04-24 LAB — GLUCOSE, CAPILLARY
Glucose-Capillary: 128 mg/dL — ABNORMAL HIGH (ref 70–99)
Glucose-Capillary: 249 mg/dL — ABNORMAL HIGH (ref 70–99)
Glucose-Capillary: 341 mg/dL — ABNORMAL HIGH (ref 70–99)

## 2011-04-24 LAB — CARDIAC PANEL(CRET KIN+CKTOT+MB+TROPI)
CK, MB: 2.3 ng/mL (ref 0.3–4.0)
Total CK: 85 U/L (ref 7–177)

## 2011-04-24 LAB — MRSA PCR SCREENING: MRSA by PCR: NEGATIVE

## 2011-04-24 LAB — BASIC METABOLIC PANEL
Calcium: 8.4 mg/dL (ref 8.4–10.5)
Creatinine, Ser: 1.55 mg/dL — ABNORMAL HIGH (ref 0.50–1.10)
GFR calc non Af Amer: 29 mL/min — ABNORMAL LOW (ref >90)
Glucose, Bld: 165 mg/dL — ABNORMAL HIGH (ref 70–99)
Sodium: 133 mEq/L — ABNORMAL LOW (ref 135–145)

## 2011-04-24 LAB — CBC
MCH: 28.2 pg (ref 26.0–34.0)
Platelets: 204 10*3/uL (ref 150–400)
RBC: 3.54 MIL/uL — ABNORMAL LOW (ref 3.87–5.11)
RDW: 13.4 % (ref 11.5–15.5)
WBC: 14.5 10*3/uL — ABNORMAL HIGH (ref 4.0–10.5)

## 2011-04-24 MED ORDER — DIPHENHYDRAMINE HCL 50 MG/ML IJ SOLN
INTRAMUSCULAR | Status: AC
  Administered 2011-04-24: 50 mg via INTRAVENOUS
  Filled 2011-04-24: qty 1

## 2011-04-24 MED ORDER — METHYLPREDNISOLONE SODIUM SUCC 125 MG IJ SOLR
60.0000 mg | Freq: Once | INTRAMUSCULAR | Status: AC
  Administered 2011-04-24: 60 mg via INTRAVENOUS
  Filled 2011-04-24: qty 0.96

## 2011-04-24 MED ORDER — HYDRALAZINE HCL 20 MG/ML IJ SOLN
10.0000 mg | Freq: Four times a day (QID) | INTRAMUSCULAR | Status: DC | PRN
  Administered 2011-04-24: 10 mg via INTRAVENOUS
  Filled 2011-04-24: qty 1

## 2011-04-24 MED ORDER — EPINEPHRINE 0.3 MG/0.3ML IJ DEVI
0.3000 mg | Freq: Once | INTRAMUSCULAR | Status: AC
  Administered 2011-04-24: 0.3 mg via INTRAMUSCULAR
  Filled 2011-04-24: qty 0.6

## 2011-04-24 MED ORDER — BETHANECHOL CHLORIDE 5 MG PO TABS
5.0000 mg | ORAL_TABLET | Freq: Four times a day (QID) | ORAL | Status: DC
  Administered 2011-04-24 – 2011-04-25 (×8): 5 mg via ORAL
  Filled 2011-04-24 (×12): qty 1

## 2011-04-24 MED ORDER — DILTIAZEM HCL ER COATED BEADS 180 MG PO CP24
180.0000 mg | ORAL_CAPSULE | Freq: Every day | ORAL | Status: DC
  Administered 2011-04-24 – 2011-04-27 (×4): 180 mg via ORAL
  Filled 2011-04-24 (×5): qty 1

## 2011-04-24 MED ORDER — INSULIN GLARGINE 100 UNIT/ML ~~LOC~~ SOLN
45.0000 [IU] | Freq: Every day | SUBCUTANEOUS | Status: DC
  Administered 2011-04-24 – 2011-04-25 (×2): 45 [IU] via SUBCUTANEOUS

## 2011-04-24 MED ORDER — GUAIFENESIN-CODEINE 100-10 MG/5ML PO SOLN
5.0000 mL | Freq: Four times a day (QID) | ORAL | Status: DC | PRN
  Administered 2011-04-24: 5 mL via ORAL
  Filled 2011-04-24: qty 5

## 2011-04-24 MED ORDER — HYDRALAZINE HCL 20 MG/ML IJ SOLN
10.0000 mg | Freq: Once | INTRAMUSCULAR | Status: AC
  Administered 2011-04-24: 10 mg via INTRAVENOUS
  Filled 2011-04-24: qty 1

## 2011-04-24 MED ORDER — DILTIAZEM HCL 60 MG PO TABS: 120.0000 mg | ORAL_TABLET | Freq: Every day | ORAL | Status: DC

## 2011-04-24 MED ORDER — FAMOTIDINE IN NACL 20-0.9 MG/50ML-% IV SOLN
20.0000 mg | INTRAVENOUS | Status: AC
  Administered 2011-04-24: 20 mg via INTRAVENOUS
  Filled 2011-04-24: qty 50

## 2011-04-24 MED ORDER — INSULIN ASPART 100 UNIT/ML ~~LOC~~ SOLN: 5.0000 [IU] | Freq: Three times a day (TID) | SUBCUTANEOUS | Status: DC

## 2011-04-24 MED ORDER — DIPHENHYDRAMINE HCL 50 MG/ML IJ SOLN
50.0000 mg | Freq: Once | INTRAMUSCULAR | Status: AC
  Administered 2011-04-24: 50 mg via INTRAVENOUS

## 2011-04-24 MED ORDER — FUROSEMIDE 10 MG/ML IJ SOLN
40.0000 mg | INTRAMUSCULAR | Status: AC
  Administered 2011-04-24: 40 mg via INTRAVENOUS
  Filled 2011-04-24: qty 4

## 2011-04-24 MED ORDER — FAMOTIDINE IN NACL 20-0.9 MG/50ML-% IV SOLN
20.0000 mg | Freq: Two times a day (BID) | INTRAVENOUS | Status: DC
  Administered 2011-04-24 – 2011-04-26 (×4): 20 mg via INTRAVENOUS
  Filled 2011-04-24 (×8): qty 50

## 2011-04-24 NOTE — Progress Notes (Signed)
Pt's BP elevated to 206/105 (lying with dinamap).  Notified MD on call, M. Lynch.  Received orders for Hydralazine 10 mg IV.  Will continue to monitor pt.

## 2011-04-24 NOTE — Consult Note (Signed)
Name: [name] MRN: [id number] DOB: 1919-09-27    LOS: 3  PCCM ADMISSION NOTE  History of Present Illness: 78 year old female with PMH of CAD and diabetes who presented to the hospital with cellulitis and UTI.  Grew E. Coli and was narrowed from vanc/zosyn to rocephin.  After the second dose of rocephin the patient developed rigors and concern for anaphylactic reaction.  Patient was transferred to the ICU for closer monitoring and PCCM was called on consultation.  Lines / Drains: PIV  Cultures: Urine 3/27>>>E coli MRSA Neg C. Diff neg Blood 3/27>>>Neg  Antibiotics: Vanc 3/30>>> Zosyn 3/30>>>  Tests / Events: Acute reaction to rocephin 3/30 with transfer to the ICU.  Past Medical History  Diagnosis Date  . Myocardial infarction   . Coronary artery disease   . Hypertension   . Dysrhythmia   . Peripheral vascular disease   . Hypothyroidism   . Diabetes mellitus   . Presbyesophagus 04/23/2011  . Urinary retention with incomplete bladder emptying 04/23/2011  . UTI (lower urinary tract infection) 04/21/2011   Past Surgical History  Procedure Date  . Cholecystectomy   . Colonoscopy 01/15/2011    Procedure: COLONOSCOPY;  Surgeon: Theda Belfast, MD;  Location: [facility];  Service: Endoscopy;  Laterality: N/A;   Prior to Admission medications   Medication Sig Start Date End Date Taking? Authorizing Provider  acetaminophen (TYLENOL) 500 MG tablet Take 500 mg by mouth every 6 (six) hours as needed.   Yes Historical Provider, MD  [place] POWD 1 capsule by Does not apply route as needed.   Yes Historical Provider, MD  calcium carbonate (OS-CAL - DOSED IN MG OF ELEMENTAL CALCIUM) 1250 MG tablet Take 1 tablet by mouth daily.   Yes Historical Provider, MD  carvedilol (COREG) 6.25 MG tablet Take 6.25 mg by mouth 2 (two) times daily with a meal.     Yes Historical Provider, MD  diltiazem (CARDIZEM CD) 180 MG 24 hr capsule Take 180 mg by mouth daily.   Yes Historical Provider,  MD  furosemide (LASIX) 20 MG tablet Take 20 mg by mouth 2 (two) times daily.     Yes Historical Provider, MD  gabapentin (NEURONTIN) 300 MG capsule Take 300 mg by mouth daily.     Yes Historical Provider, MD  insulin glargine (LANTUS) 100 UNIT/ML injection Inject 42 Units into the skin at bedtime.     Yes Historical Provider, MD  insulin glulisine (APIDRA) 100 UNIT/ML injection Inject 12 Units into the skin 3 (three) times daily before meals.     Yes Historical Provider, MD  isosorbide mononitrate (IMDUR) 30 MG 24 hr tablet Take 30 mg by mouth daily.     Yes Historical Provider, MD  levothyroxine (SYNTHROID, LEVOTHROID) 200 MCG tablet Take 200 mcg by mouth daily.   Yes Historical Provider, MD  levothyroxine (SYNTHROID, LEVOTHROID) 25 MCG tablet Take 25 mcg by mouth daily.   Yes Historical Provider, MD  Multiple Vitamin (MULITIVITAMIN WITH MINERALS) TABS Take 1 tablet by mouth daily.   Yes Historical Provider, MD  oxybutynin (DITROPAN) 5 MG tablet Take 10 mg by mouth at bedtime.   Yes Historical Provider, MD  paregoric 2 MG/5ML solution Take 10 mLs by mouth 4 (four) times daily as needed. For bowel movement   Yes Historical Provider, MD  Tamsulosin HCl (FLOMAX) 0.4 MG CAPS Take 0.4 mg by mouth daily.     Yes Historical Provider, MD  traZODone (DESYREL) 50 MG tablet Take 50 mg by  mouth at bedtime.     Yes Historical Provider, MD  vitamin B-12 (CYANOCOBALAMIN) 1000 MCG tablet Take 1,000 mcg by mouth daily.   Yes Historical Provider, MD  vitamin E 100 UNIT capsule Take 100 Units by mouth daily.   Yes Historical Provider, MD    Allergies Allergies  Allergen Reactions  . Ephedrine   . Morphine   . Rocephin (Ceftriaxone Sodium In Dextrose)     Developed acute resp distress, wheezing after 2nd dose.    Family History History reviewed. No pertinent family history.  Social History  reports that she quit smoking about 30 years ago. Her smoking use included Cigarettes. She has never used smokeless  tobacco. She reports that she drinks alcohol. She reports that she does not use illicit drugs.  Review Of Systems  11 points review of systems is negative with an exception of listed in HPI.  Vital Signs: Filed Vitals:   04/24/11 0900  BP: 169/76  Pulse: 81  Temp:   Resp:     Intake/Output Summary (Last 24 hours) at 04/24/11 1120 Last data filed at 04/24/11 0900  Gross per 24 hour  Intake   2240 ml  Output   1453 ml  Net    787 ml    Physical Examination: General:  Obese elderly female with moderate respiratory distress. Neuro:  In respiratory distress but moving all ext to commands and answering questions appropriately.   HEENT:  [place]/AT, PERRL, EOM-I and DMM. Neck:  Supple, -LAN and -thyromegally.   Cardiovascular:  RRR, Nl S1/S2, -M/R/G. Lungs:  Diffuse wheezes audible more at the neck. Abdomen:  Soft, NT, ND and +BS. Musculoskeletal:  -edema and -tenderness. Skin:  Intact.  Labs and Imaging:   Labs: CBC    Component Value Date/Time   WBC 14.5* 04/24/2011 0424   RBC 3.54* 04/24/2011 0424   HGB 10.0* 04/24/2011 0424   HCT 31.0* 04/24/2011 0424   PLT 204 04/24/2011 0424   MCV 87.6 04/24/2011 0424   MCH 28.2 04/24/2011 0424   MCHC 32.3 04/24/2011 0424   RDW 13.4 04/24/2011 0424   LYMPHSABS 0.9 04/10/2011 1353   MONOABS 0.6 04/10/2011 1353   EOSABS 0.1 04/10/2011 1353   BASOSABS 0.0 04/10/2011 1353   BMET    Component Value Date/Time   NA 133* 04/24/2011 0424   K 3.9 04/24/2011 0424   CL 98 04/24/2011 0424   CO2 26 04/24/2011 0424   GLUCOSE 165* 04/24/2011 0424   BUN 25* 04/24/2011 0424   CREATININE 1.55* 04/24/2011 0424   CALCIUM 8.4 04/24/2011 0424   GFRNONAA 29* 04/24/2011 0424   GFRAA 33* 04/24/2011 0424   ABG    Component Value Date/Time   HCO3 26.3* 01/20/2010 1830   TCO2 29 01/20/2010 2002   O2SAT 34.1 01/20/2010 1830    No results found for this basename: MG in the last 168 hours Lab Results  Component Value Date   CALCIUM 8.4 04/24/2011   PHOS 3.0 01/21/2010     Assessment and Plan: 78 year old female with wheezing after being given rocephin.  Steroids, epi, benadryl and H2 blockers given with adequate response.  Will transfer to SDU for closer observation.  KVO IVF and give one dose of lasix.  Change abx to vanc and zosyn and D/C rocephin.  Titrate down O2 and use IS.  Best practices / Disposition: -->ICU status under [facility] -->full code -->Heparin for DVT Px -->Protonix for GI Px -->ventilator bundle -->diet -->family updated at bedside  [facility]  Molli Knock, M.D. Pulmonary and Critical Care Medicine [facility] [phone]  04/24/2011, 11:20 AM

## 2011-04-24 NOTE — Progress Notes (Signed)
Re-checked pt's BP after administering Hydralazine 10 mg IV.  BP lying in bed was 170/70 with dinamap.  Will continue to monitor pt.

## 2011-04-24 NOTE — Progress Notes (Addendum)
Addendum:  Addendum to previously written progress note.  Called by RN due to respiratory distress and new onset wheeze.  Came to bedside to find patient in respiratory distress, upper airway wheezes.  STAT PCXR, ABG, Pro-BNP, cardiac markers ordered.  Patient given 0.3 mL of epinephrine, 50 mg benadryl, 60 mg Solumedrol (already on Pepcid) and Lasix 40 mg IV.  PCXR consistent with possible pulmonary edema.  PCCM consulted and the patient was examined at the bedside with Dr. Molli Knock.  Patient is being transferred to the SDU.  Daughter updated of plan of care at bedside.  The patient is critically ill with multiple systems failure.  I spent a total of 45 minutes in direct critical care of the patient.  Lawanna Cecere 04/24/2011 11:14 AM

## 2011-04-24 NOTE — Progress Notes (Signed)
PROGRESS NOTE  [name] ZOX:[id number] DOB: 1920/01/17 DOA: 04/21/2011 PCP: [name], MD, MD  Brief narrative: [name] is an 78 year old female with a past medical history of coronary artery disease, diabetes, atrial flutter, hypertension, dyslipidemia, hypothyroidism, and ischemic colitis who presented to the hospital on 04/21/2011 with nausea, vomiting, and feeling unwell. Upon initial evaluation emergency department, the patient was found to have evidence of both a urinary tract infection and right lower extremity cellulitis. She was also dehydrated. She was placed on broad-spectrum antibiotic therapy, which was narrowed 04/23/11.  Assessment/Plan: Principal Problem:  *UTI (lower urinary tract infection) / Urinary retention Patient was admitted and a urinalysis was done which showed a large amount of leukocytes and too numerous to count white blood cells on microscopy. There were also 7-10 red blood cells and a few bacteria. Nitrites were negative. The patient had urinary retention on admission, as well, prompting [place] to place a foley catheter.  She reports that she has an outpatient appointment with a urologist because she has this issue chronically, which likely precipitated her UTI.  Cultures growing E. Coli, rocephin sensitive, which the patient was put on 04/23/11.  Will d/c the foley, start Urecholine, and check post void residuals.  If ongoing retention, will replace the foley until she follows up with her urologist in May. Active Problems:  Cellulitis and abscess of leg, except foot / wound eschar of foot The patient does have some asymmetric erythema of her right lower extremity consistent with possible cellulitis. She was initially placed on broad-spectrum antibiotics as noted. Given her diarrhea, and the low suspicion for clinically significant cellulitis, will narrow antibiotics.    Dehydration / Acute renal injury /  Stage III chronic kidney disease The patient has been placed on  IV fluids and her diuretics have been placed on hold. Her creatinine has come down with IV fluid hydration. The patient does have significant renal impairment with her GFR ranging from 18 up to 45. This corresponds with stage III-4 chronic kidney disease. We will continue to hydrate her and monitor her renal function until it is back to baseline. Her baseline creatinine is as low as 1.14 (01/23/10) but most recently, her creatinine was 1.60 on 04/10/2011, and 1.55 today indicating recovery of renal function. Her underlying chronic kidney disease is likely secondary to diabetic nephropathy.  Altered mental status / toxic encephalopathy Patient likely suffered from toxic encephalopathy related to infections. This point, her mental status is clear.  Hypothyroidism The patient's TSH was checked and was found to be within normal limits. Continue usual dose of Synthroid.  CAD (coronary artery disease) Patient denies chest pain though she is having palpitations. One troponin was checked in the emergency department, and it was negative. Continue Coreg, Imdur.  History of atrial flutter Patient is status post ablation and is on rate controlling medications. Her 12-lead EKG showed normal sinus rhythm. Patient has subjective palpitations but her heart sounds regular on exam. Continue Coreg, Cardizem. TSH was normal.  D/C tele.  DM (diabetes mellitus) type I uncontrolled with renal manifestation Continue insulin therapy. CBGs 164-316 over the past 24 hours, despite increase in Lantus to 42 units nightly and the  Addition of 4 units of Novolog meal coverage per DM coordinator recommendations.  Will increase Lantus to 45 units and meal coverage to 5 units.  Hyponatremia May be from hypothyroidism versus pseudo-hyponatremia from elevated blood glucoses. Mild. We'll continue to monitor.  Hyperkalemia Likely from acute kidney injury in the setting of  advanced chronic kidney disease. Patient's potassium has normalized  with hydration and improvement in her renal function.  Normocytic anemia Likely anemia of chronic kidney disease. Hemoglobin is stable.  Dysphasia secondary to presbyesophagus Patient complains of subacute regurgitation of food consistent with dysphasia. We asked speech therapy to evaluate her and they recommended an esophogram, which was done on 04/22/11 and showed mild presbyesophagus.  Continue dysphagia 3 diet.  Questionable pulmonary edema on chest radiography Patient does not have any clinical signs suggestive of decompensated heart failure. A two-dimensional echocardiogram was ordered by the admitting physician. Patient's last echocardiogram was done on 06/12/2008 and had a normal EF.  No further evidence of CHF.  No dyspnea or findings on lung exam to suggest CHF.  Will d/c the Echo.  Chronic diarrhea The patient normally takes paregoric for this.  It was stopped on admission.  Stool PCR for clostridium difficile was ordered, but she reports having a formed stool today.  Will re-order paregoric PRN.  Malignant HTN BP malignantly high today.  May reflect emotional state.  Given Hydralazine x 2 for this.  Will resume Cardizem.  Continue to hold lasix for now, given ongoing diarrhea.   Code Status: Full Family Communication: [name] Tel: [phone] Disposition Plan: Home, when stable  Consultants:  Wound care nurse: F/U podiatrist post discharge for toe ulcer  Dietician: Dysphagia 3 diet for swallowing difficulty  Speech therapy: Esophogram ordered per recs.  Physical therapy: SNF vs HHPT if improves  Occupational therapy  Diabetes Coordinator: Titrate basal insulin until FBS > 140.  Procedures:  12-lead EKG 04/21/2011: SINUS RHYTHM ~ normal P axis, V-rate 50- 99  Antibiotics:  Zosyn 04/21/11--->04/23/11  Vancomycin 04/21/11--->04/23/11  Rocephin 04/21/2011---> 04/21/2011, 04/23/11   Subjective  [name] reports 4 diarrheal stools yesterday.  States she has  not received any paragoric yet.  Didn't sleep well.    No nausea or vomiting.  No dyspnea or chest discomfort.  Upset this morning, couldn't get to her phone when it was ringing, and feels frustrated from this and lack of sleep.     Objective    Interim History: Stable overnight.   Objective: Filed Vitals:   04/23/11 0500 04/23/11 1444 04/23/11 2208 04/24/11 0600  BP: 154/71 147/65 177/72 206/105  Pulse: 79 66 74 82  Temp: 99.5 F (37.5 C) 98.9 F (37.2 C) 98.4 F (36.9 C) 99.9 F (37.7 C)  TempSrc: Oral Oral Oral Oral  Resp: 18 16 18 20   Height:      Weight:    84.6 kg (186 lb 8.2 oz)  SpO2: 95% 96% 97% 92%    Intake/Output Summary (Last 24 hours) at 04/24/11 0981 Last data filed at 04/24/11 0140  Gross per 24 hour  Intake   1600 ml  Output   1454 ml  Net    146 ml    Exam: Gen:  NAD Cardiovascular:  RRR, No M/R/G Respiratory: Lungs CTAB Gastrointestinal: Abdomen soft, NT/ND with normal active bowel sounds. Extremities: 1+ edema bilaterally. Mild bilateral erythema.  Very tiny ulcer right great toe with scab  Data Reviewed: Basic Metabolic Panel:  Lab 04/24/11 1914 04/23/11 0505 04/22/11 0506 04/21/11 2305 04/21/11 1341  NA 133* 131* 133* -- 131*  K 3.9 3.9 -- -- --  CL 98 94* 96 -- 92*  CO2 26 28 28  -- 29  GLUCOSE 165* 294* 235* -- 232*  BUN 25* 35* 38* -- 33*  CREATININE 1.55* 1.97* 2.22* 2.07* 1.85*  CALCIUM 8.4 8.2*  8.4 -- 8.9  MG -- -- -- -- --  PHOS -- -- -- -- --   GFR Estimated Creatinine Clearance: 28 ml/min (by C-G formula based on Cr of 1.55). Liver Function Tests:  Lab 04/22/11 0506  AST 17  ALT 11  ALKPHOS 74  BILITOT 0.3  PROT 6.1  ALBUMIN 2.5*    CBC:  Lab 04/24/11 0424 04/23/11 0505 04/22/11 0506 04/21/11 2305  WBC 14.5* 16.1* 19.5* 18.6*  NEUTROABS -- -- -- --  HGB 10.0* 10.0* 10.7* 11.8*  HCT 31.0* 30.9* 33.0* 36.4  MCV 87.6 88.0 88.2 88.3  PLT 204 207 208 233   Cardiac Enzymes:  Lab 04/21/11 1341  CKTOTAL --  CKMB  --  CKMBINDEX --  TROPONINI <0.30   CBG:  Lab 04/23/11 2208 04/23/11 1628 04/23/11 1131 04/23/11 0734 04/23/11 0517  GLUCAP 176* 164* 316* 296* 260*   Hgb A1c  Basename 04/21/11 2305  HGBA1C 8.0*   Thyroid function studies  Basename 04/21/11 2305  TSH 2.844  T4TOTAL --  T3FREE --  THYROIDAB --   Microbiology Recent Results (from the past 240 hour(s))  URINE CULTURE     Status: Normal   Collection Time   04/21/11  5:52 PM      Component Value Range Status Comment   Specimen Description URINE, CATHETERIZED   Final    Special Requests NONE   Final    Culture  Setup Time [id number]   Final    Colony Count >=100,000 COLONIES/ML   Final    Culture ESCHERICHIA COLI   Final    Report Status 04/23/2011 FINAL   Final    Organism ID, Bacteria ESCHERICHIA COLI   Final   CULTURE, BLOOD (ROUTINE X 2)     Status: Normal (Preliminary result)   Collection Time   04/21/11 11:05 PM      Component Value Range Status Comment   Specimen Description BLOOD RIGHT HAND   Final    Special Requests BOTTLES DRAWN AEROBIC AND ANAEROBIC 3CC   Final    Culture  Setup Time [id number]   Final    Culture     Final    Value:        BLOOD CULTURE RECEIVED NO GROWTH TO DATE CULTURE WILL BE HELD FOR 5 DAYS BEFORE ISSUING A FINAL NEGATIVE REPORT   Report Status PENDING   Incomplete   CULTURE, BLOOD (ROUTINE X 2)     Status: Normal (Preliminary result)   Collection Time   04/21/11 11:05 PM      Component Value Range Status Comment   Specimen Description BLOOD L HAND   Final    Special Requests BOTTLES DRAWN AEROBIC AND ANAEROBIC 2CC   Final    Culture  Setup Time [id number]   Final    Culture     Final    Value:        BLOOD CULTURE RECEIVED NO GROWTH TO DATE CULTURE WILL BE HELD FOR 5 DAYS BEFORE ISSUING A FINAL NEGATIVE REPORT   Report Status PENDING   Incomplete   CLOSTRIDIUM DIFFICILE BY PCR     Status: Normal   Collection Time   04/23/11  7:17 AM      Component Value Range Status Comment   C  difficile by pcr NEGATIVE  NEGATIVE  Final     Studies:  Dg Chest Port 1 View 04/21/2011  *RADIOLOGY REPORT*  Clinical Data: Weakness and shortness of breath.  PORTABLE CHEST - 1 VIEW  Comparison:  Fourth 32,011.  Findings: The heart is mildly enlarged but stable.  There is tortuosity and calcification of the thoracic aorta.  There is moderate vascular congestion and possible mild interstitial edema. No definite pleural effusions or focal infiltrates.  The bony thorax is intact.  Remote AVN involving the right humeral head.  IMPRESSION: Cardiac enlargement with vascular congestion and probable mild interstitial edema.  Original Report Authenticated By: P. Loralie Champagne, M.D.    Dg Esophagus 04/22/2011  IMPRESSION: Mild presbyesophagus.  Negative for stricture or focal abnormality.  Original Report Authenticated By: Bernadene Bell. D'ALESSIO, M.D.     Scheduled Meds:    . calcium carbonate  1 tablet Oral Daily  . carvedilol  6.25 mg Oral BID WC  . cefTRIAXone (ROCEPHIN)  IV  1 g Intravenous Q24H  . enoxaparin  30 mg Subcutaneous Q24H  . feeding supplement  237 mL Oral QPC supper  . [place]-Q  1 capsule Oral Daily  . gabapentin  300 mg Oral Daily  . insulin aspart  0-15 Units Subcutaneous TID WC  . insulin aspart  0-5 Units Subcutaneous QHS  . insulin aspart  4 Units Subcutaneous TID WC  . insulin glargine  42 Units Subcutaneous QHS  . isosorbide mononitrate  30 mg Oral Daily  . levothyroxine  225 mcg Oral QAC breakfast  . mulitivitamin with minerals  1 tablet Oral Daily  . oxybutynin  10 mg Oral QHS  . pantoprazole (PROTONIX) IV  40 mg Intravenous Q24H  . Tamsulosin HCl  0.4 mg Oral Daily  . traZODone  50 mg Oral QHS  . vitamin B-12  1,000 mcg Oral Daily  . vitamin E  100 Units Oral Daily  . DISCONTD: levothyroxine  200 mcg Oral QAC breakfast  . DISCONTD: levothyroxine  25 mcg Oral QAC breakfast  . DISCONTD: piperacillin-tazobactam (ZOSYN)  IV  3.375 g Intravenous Q8H  . DISCONTD:  vancomycin  750 mg Intravenous Q24H   Continuous Infusions:    . sodium chloride 50 mL/hr at 04/24/11 0015      LOS: 3 days   Hillery Aldo, MD Pager [phone]  04/24/2011, 7:22 AM

## 2011-04-24 NOTE — Progress Notes (Signed)
OT Cancellation Note  Treatment cancelled today due to medical issues with patient which prohibited therapy - pt awaiting cardiac enzymes and blood gasses STAT.  Will re-attempt as medically appropriate  Boykin Reaper [phone] 04/24/2011, 11:00 AM

## 2011-04-25 DIAGNOSIS — R05 Cough: Secondary | ICD-10-CM

## 2011-04-25 DIAGNOSIS — T782XXA Anaphylactic shock, unspecified, initial encounter: Secondary | ICD-10-CM

## 2011-04-25 HISTORY — DX: Anaphylactic shock, unspecified, initial encounter: T78.2XXA

## 2011-04-25 LAB — BASIC METABOLIC PANEL
BUN: 27 mg/dL — ABNORMAL HIGH (ref 6–23)
Calcium: 8.7 mg/dL (ref 8.4–10.5)
GFR calc Af Amer: 36 mL/min — ABNORMAL LOW (ref >90)
GFR calc non Af Amer: 31 mL/min — ABNORMAL LOW (ref >90)
Glucose, Bld: 266 mg/dL — ABNORMAL HIGH (ref 70–99)
Sodium: 131 mEq/L — ABNORMAL LOW (ref 135–145)

## 2011-04-25 LAB — GLUCOSE, CAPILLARY
Glucose-Capillary: 243 mg/dL — ABNORMAL HIGH (ref 70–99)
Glucose-Capillary: 271 mg/dL — ABNORMAL HIGH (ref 70–99)
Glucose-Capillary: 189 mg/dL — ABNORMAL HIGH (ref 70–99)

## 2011-04-25 LAB — CBC
MCH: 28.7 pg (ref 26.0–34.0)
MCHC: 33.3 g/dL (ref 30.0–36.0)
Platelets: 212 10*3/uL (ref 150–400)
RBC: 3.63 MIL/uL — ABNORMAL LOW (ref 3.87–5.11)

## 2011-04-25 MED ORDER — FUROSEMIDE 20 MG PO TABS
20.0000 mg | ORAL_TABLET | Freq: Two times a day (BID) | ORAL | Status: DC
  Administered 2011-04-25 – 2011-04-27 (×5): 20 mg via ORAL
  Filled 2011-04-25 (×6): qty 1

## 2011-04-25 MED ORDER — CIPROFLOXACIN HCL 250 MG PO TABS
250.0000 mg | ORAL_TABLET | Freq: Two times a day (BID) | ORAL | Status: DC
  Administered 2011-04-25 – 2011-04-27 (×5): 250 mg via ORAL
  Filled 2011-04-25 (×6): qty 1

## 2011-04-25 MED ORDER — LORAZEPAM 0.5 MG PO TABS
0.5000 mg | ORAL_TABLET | Freq: Once | ORAL | Status: AC
  Administered 2011-04-25: 0.5 mg via ORAL
  Filled 2011-04-25: qty 1

## 2011-04-25 MED ORDER — INSULIN ASPART 100 UNIT/ML ~~LOC~~ SOLN
6.0000 [IU] | Freq: Three times a day (TID) | SUBCUTANEOUS | Status: DC
  Administered 2011-04-25 (×2): 6 [IU] via SUBCUTANEOUS

## 2011-04-25 NOTE — Progress Notes (Addendum)
Report called to 4th floor. Patient to be moved to room 1406. When this RN received report on the patient, I was told that after the patient voids she was to have a bladder scan done each time. If the residual volume was greater than x3 occurrences the patient was to have a foley reinserted. The RN admitting the patient to the fourth floor was given this information in report and told that 2 occurences had been completed and the residual was greater than 250 ml each time.

## 2011-04-25 NOTE — Progress Notes (Addendum)
PROGRESS NOTE  [name] ZOX:[id number] DOB: 1919/07/08 DOA: 04/21/2011 PCP: [name] Grippe, MD, MD  Brief narrative: [name] is an 78 year old female with a past medical history of coronary artery disease, diabetes, atrial flutter, hypertension, dyslipidemia, hypothyroidism, and ischemic colitis who presented to the hospital on 04/21/2011 with nausea, vomiting, and feeling unwell. Upon initial evaluation emergency department, the patient was found to have evidence of both a urinary tract infection and right lower extremity cellulitis. She was also dehydrated. She was placed on broad-spectrum antibiotic therapy, which was narrowed 04/23/11.  On 04/24/11, she was transferred to the SDU due to an anaphylactic reaction to Rocephin requiring treatment with epinephrine, Benadryl, Solumedrol and Pepcid.    Assessment/Plan: Principal Problem:  *UTI (lower urinary tract infection) / Urinary retention / Anaphylaxis (Presumed Rocephin induced) Patient was admitted and a urinalysis was done which showed a large amount of leukocytes and too numerous to count white blood cells on microscopy. There were also 7-10 red blood cells and a few bacteria. Nitrites were negative. The patient had urinary retention on admission, as well, prompting [place] to place a foley catheter.  She reports that she has an outpatient appointment with a urologist because she has this issue chronically, which likely precipitated her UTI.  Cultures growing E. Coli, rocephin sensitive, which the patient was put on 04/23/11.  The patient's condition deteriorated with acute respiratory distress and throat swelling, thought to be an anaphylactic reaction to Rocephin requiring emergent treatment and transfer to the SDU, so the foley was left in place.  The Rocephin was discontinued.  Her condition rapidly improved with the administration of epinephrine, Benadryl, Solumedrol and Pepcid.  Will order Cipro based on sensitivities. Active Problems:  Cellulitis  and abscess of leg, except foot / wound eschar of foot The patient does have some asymmetric erythema of her right lower extremity consistent with possible cellulitis. She was initially placed on broad-spectrum antibiotics as noted. Given her diarrhea, and the low suspicion for clinically significant cellulitis, we narrowed antibiotics to Rocephin on 04/21/11.  No further treatment felt to be needed for cellulitis.  Dehydration / Acute renal injury /  Stage III chronic kidney disease The patient has been placed on IV fluids and her diuretics have been placed on hold. Her creatinine has come down with IV fluid hydration. The patient does have significant renal impairment with her GFR ranging from 18 up to 45. This corresponds with stage III-4 chronic kidney disease. We continued to hydrate her until 04/24/11, when she developed acute respiratory distress from anaphylaxis and possible pulmonary edema.   Her baseline creatinine is as low as 1.14 (01/23/10) but most recently, her creatinine was 1.60 on 04/10/2011, and 1.44 today indicating recovery of renal function. Her underlying chronic kidney disease is likely secondary to diabetic nephropathy.  Will resume home diuretic therapy.  Altered mental status / toxic encephalopathy Patient likely suffered from toxic encephalopathy related to infections. This point, her mental status is clear.  Hypothyroidism The patient's TSH was checked and was found to be within normal limits. Continue usual dose of Synthroid.  CAD (coronary artery disease) Patient denies chest pain though she is having palpitations. One troponin was checked in the emergency department, and it was negative. Continue Coreg, Imdur.  History of atrial flutter Patient is status post ablation and is on rate controlling medications. Her 12-lead EKG showed normal sinus rhythm. Patient has subjective palpitations but her heart sounds regular on exam. Continue Coreg, Cardizem. TSH was normal.  DM  (diabetes mellitus) type I uncontrolled with renal manifestation Continue insulin therapy. CBGs 128-341 over the past 24 hours, despite increase in Lantus to 45 units nightly and the  Addition of 5 units of Novolog meal coverage.  Will increase meal coverage to 6 units.  Hyponatremia May be from hypothyroidism versus pseudo-hyponatremia from elevated blood glucoses. Mild. We'll continue to monitor.  Hyperkalemia Likely from acute kidney injury in the setting of advanced chronic kidney disease. Patient's potassium has normalized with hydration and improvement in her renal function.  Normocytic anemia Likely anemia of chronic kidney disease. Hemoglobin is stable.  Dysphasia secondary to presbyesophagus Patient complains of subacute regurgitation of food consistent with dysphasia. We asked speech therapy to evaluate her and they recommended an esophogram, which was done on 04/22/11 and showed mild presbyesophagus.  Continue dysphagia 3 diet.  Questionable pulmonary edema on chest radiography Patient initially did not have any clinical signs suggestive of decompensated heart failure. A two-dimensional echocardiogram was ordered by the admitting physician, but was cancelled. Patient's last echocardiogram was done on 06/12/2008 and she had a normal EF at that time. The patient developed acute respiratory distress thought to be secondary to anaphylaxis and pulmonary edema on 04/24/11.  She was given a dose of Lasix, her IVF were decreased to [facility], and her anaphylaxis was appropriately treated, resulting in stabilization of her respiratory function.  Will re-order the 2 D Echo to evaluate LV function. Chronic diarrhea The patient normally takes paregoric for this.  It was stopped on admission.  Stool PCR for clostridium difficile was ordered, which was negative.  Will re-order paregoric PRN (Note: not available in our pharmacy, patient was instructed to have family bring from home).  Malignant HTN BP malignantly  high 04/24/11.  Given Hydralazine x 2 for this.  Resumed Cardizem on 04/24/11 and was given a dose of IV Lasix as well.  Will resume oral Lasix today.  BP better today.  Code Status: Full Family Communication: Daughter Annice Pih Tel: [phone], left message. Disposition Plan: Home, when stable  Medical Consultants:  Dr. Koren Bound, PCCM  Consultants:  Wound care nurse: F/U podiatrist post discharge for toe ulcer  Dietician: Dysphagia 3 diet for swallowing difficulty  Speech therapy: Esophogram ordered per recs.  Physical therapy: SNF vs HHPT if improves  Occupational therapy  Diabetes Coordinator: Titrate basal insulin until FBS > 140.  Procedures:  12-lead EKG 04/21/2011: SINUS RHYTHM ~ normal P axis, V-rate 50- 99  Antibiotics:  Zosyn 04/21/11--->04/23/11  Vancomycin 04/21/11--->04/23/11  Rocephin 04/21/2011---> 04/21/2011, 04/23/11--->04/24/11  Cipro 04/25/11--->    Subjective  Mrs. Olivo was restless last night and needed low dose Ativan for sleep.  She denies dyspnea although she still has some hypoxia and has required a higher concentration of oxygen to maintain her oxygen saturations > 90%.  No nausea or vomiting.   Objective    Interim History: Hypoxic overnight requiring 50% non-rebreather mask to maintain her oxygen saturations.   Objective: Filed Vitals:   04/25/11 0100 04/25/11 0300 04/25/11 0400 04/25/11 0500  BP:    139/49  Pulse: 60 55  61  Temp:   97.5 F (36.4 C)   TempSrc:   Oral   Resp: 23 21  18   Height:      Weight:    81.9 kg (180 lb 8.9 oz)  SpO2: 95% 94%  94%    Intake/Output Summary (Last 24 hours) at 04/25/11 0724 Last data filed at 04/25/11 0700  Gross per 24 hour  Intake    645 ml  Output   4601 ml  Net  -3956 ml    Exam: Gen:  NAD Cardiovascular:  RRR, No M/R/G Respiratory: Lungs CTAB upper, diminished in the bases Gastrointestinal: Abdomen soft, NT/ND with normal active bowel sounds. Extremities: 1+ edema  bilaterally. Mild bilateral erythema.  Very tiny ulcer right great toe with scab  Data Reviewed: Basic Metabolic Panel:  Lab 04/25/11 0865 04/24/11 0424 04/23/11 0505 04/22/11 0506 04/21/11 2305 04/21/11 1341  NA 131* 133* 131* 133* -- 131*  K 4.0 3.9 -- -- -- --  CL 94* 98 94* 96 -- 92*  CO2 30 26 28 28  -- 29  GLUCOSE 266* 165* 294* 235* -- 232*  BUN 27* 25* 35* 38* -- 33*  CREATININE 1.44* 1.55* 1.97* 2.22* 2.07* --  CALCIUM 8.7 8.4 8.2* 8.4 -- 8.9  MG -- -- -- -- -- --  PHOS -- -- -- -- -- --   GFR Estimated Creatinine Clearance: 29.7 ml/min (by C-G formula based on Cr of 1.44). Liver Function Tests:  Lab 04/22/11 0506  AST 17  ALT 11  ALKPHOS 74  BILITOT 0.3  PROT 6.1  ALBUMIN 2.5*    CBC:  Lab 04/25/11 0542 04/24/11 0424 04/23/11 0505 04/22/11 0506 04/21/11 2305  WBC 10.5 14.5* 16.1* 19.5* 18.6*  NEUTROABS -- -- -- -- --  HGB 10.4* 10.0* 10.0* 10.7* 11.8*  HCT 31.2* 31.0* 30.9* 33.0* 36.4  MCV 86.0 87.6 88.0 88.2 88.3  PLT 212 204 207 208 233   Cardiac Enzymes:  Lab 04/24/11 1106 04/21/11 1341  CKTOTAL 85 --  CKMB 2.3 --  CKMBINDEX -- --  TROPONINI <0.30 <0.30    Ref. Range 04/23/2011 05:05 04/24/2011 11:06  Pro B Natriuretic peptide (BNP) Latest Range: 0-450 pg/mL 4651.0 (H) 8616.0 (H)   CBG:  Lab 04/24/11 2158 04/24/11 1633 04/24/11 1138 04/24/11 0732 04/23/11 2208  GLUCAP 341* 249* 173* 128* 176*     Ref. Range 04/21/2011 14:15  Lactic Acid, Venous Latest Range: 0.5-2.2 mmol/L 2.3 (H)   Microbiology Recent Results (from the past 240 hour(s))  URINE CULTURE     Status: Normal   Collection Time   04/21/11  5:52 PM      Component Value Range Status Comment   Specimen Description URINE, CATHETERIZED   Final    Special Requests NONE   Final    Culture  Setup Time [id number]   Final    Colony Count >=100,000 COLONIES/ML   Final    Culture ESCHERICHIA COLI   Final    Report Status 04/23/2011 FINAL   Final    Organism ID, Bacteria ESCHERICHIA COLI    Final   CULTURE, BLOOD (ROUTINE X 2)     Status: Normal (Preliminary result)   Collection Time   04/21/11 11:05 PM      Component Value Range Status Comment   Specimen Description BLOOD RIGHT HAND   Final    Special Requests BOTTLES DRAWN AEROBIC AND ANAEROBIC 3CC   Final    Culture  Setup Time [id number]   Final    Culture     Final    Value:        BLOOD CULTURE RECEIVED NO GROWTH TO DATE CULTURE WILL BE HELD FOR 5 DAYS BEFORE ISSUING A FINAL NEGATIVE REPORT   Report Status PENDING   Incomplete   CULTURE, BLOOD (ROUTINE X 2)     Status: Normal (Preliminary result)   Collection Time   04/21/11 11:05  PM      Component Value Range Status Comment   Specimen Description BLOOD L HAND   Final    Special Requests BOTTLES DRAWN AEROBIC AND ANAEROBIC 2CC   Final    Culture  Setup Time [id number]   Final    Culture     Final    Value:        BLOOD CULTURE RECEIVED NO GROWTH TO DATE CULTURE WILL BE HELD FOR 5 DAYS BEFORE ISSUING A FINAL NEGATIVE REPORT   Report Status PENDING   Incomplete   CLOSTRIDIUM DIFFICILE BY PCR     Status: Normal   Collection Time   04/23/11  7:17 AM      Component Value Range Status Comment   C difficile by pcr NEGATIVE  NEGATIVE  Final   MRSA PCR SCREENING     Status: Normal   Collection Time   04/24/11 11:52 AM      Component Value Range Status Comment   MRSA by PCR NEGATIVE  NEGATIVE  Final     Studies:  Dg Chest Port 1 View 04/21/2011  *RADIOLOGY REPORT*  Clinical Data: Weakness and shortness of breath.  PORTABLE CHEST - 1 VIEW  Comparison: Fourth 32,011.  Findings: The heart is mildly enlarged but stable.  There is tortuosity and calcification of the thoracic aorta.  There is moderate vascular congestion and possible mild interstitial edema. No definite pleural effusions or focal infiltrates.  The bony thorax is intact.  Remote AVN involving the right humeral head.  IMPRESSION: Cardiac enlargement with vascular congestion and probable mild interstitial edema.   Original Report Authenticated By: P. Loralie Champagne, M.D.    Dg Esophagus 04/22/2011  IMPRESSION: Mild presbyesophagus.  Negative for stricture or focal abnormality.  Original Report Authenticated By: Bernadene Bell. D'ALESSIO, M.D.    Dg Chest Port 1 View 04/24/2011  IMPRESSION: Slight interval increase in degree of pulmonary edema and the size of small bilateral effusions.  Original Report Authenticated By: Brandon Melnick, M.D.    Scheduled Meds:    . bethanechol  5 mg Oral QID  . calcium carbonate  1 tablet Oral Daily  . carvedilol  6.25 mg Oral BID WC  . diltiazem  180 mg Oral Daily  . diphenhydrAMINE  50 mg Intravenous Once  . enoxaparin  30 mg Subcutaneous Q24H  . EPINEPHrine  0.3 mg Intramuscular Once  . famotidine (PEPCID) IV  20 mg Intravenous STAT  . famotidine (PEPCID) IV  20 mg Intravenous Q12H  . feeding supplement  237 mL Oral QPC supper  . Flora-Q  1 capsule Oral Daily  . furosemide  40 mg Intravenous STAT  . gabapentin  300 mg Oral Daily  . hydrALAZINE  10 mg Intravenous Once  . insulin aspart  0-15 Units Subcutaneous TID WC  . insulin aspart  0-5 Units Subcutaneous QHS  . insulin aspart  5 Units Subcutaneous TID WC  . insulin glargine  45 Units Subcutaneous QHS  . isosorbide mononitrate  30 mg Oral Daily  . levothyroxine  225 mcg Oral QAC breakfast  . LORazepam  0.5 mg Oral Once  . methylPREDNISolone (SOLU-MEDROL) injection  60 mg Intravenous Once  . mulitivitamin with minerals  1 tablet Oral Daily  . oxybutynin  10 mg Oral QHS  . pantoprazole (PROTONIX) IV  40 mg Intravenous Q24H  . Tamsulosin HCl  0.4 mg Oral Daily  . traZODone  50 mg Oral QHS  . vitamin B-12  1,000 mcg Oral Daily  .  vitamin E  100 Units Oral Daily  . DISCONTD: cefTRIAXone (ROCEPHIN)  IV  1 g Intravenous Q24H  . DISCONTD: diltiazem  120 mg Oral Daily  . DISCONTD: insulin aspart  4 Units Subcutaneous TID WC  . DISCONTD: insulin glargine  42 Units Subcutaneous QHS   Continuous Infusions:    .  DISCONTD: sodium chloride 50 mL/hr at 04/24/11 0015      LOS: 4 days   Hillery Aldo, MD Pager [phone]  04/25/2011, 7:24 AM

## 2011-04-25 NOTE — Progress Notes (Signed)
HPI:  78 year old female with PMH of CAD and diabetes who presented to the hospital with cellulitis and UTI. Grew E. Coli and was narrowed from vanc/zosyn to rocephin. After the second dose of rocephin the patient developed rigors and concern for anaphylactic reaction. Patient was transferred to the ICU for closer monitoring and PCCM was called on consultation.  Antibiotics:   Vanc 3/30>>> 3/31 Zosyn 3/30>>>3/31 Cipro 3/31>>>  Cultures/Sepsis Markers:   Urine 3/27>>>E coli  MRSA Neg  C. Diff neg  Blood 3/27>>>Neg  Access/Protocols:  PIV  Best Practice: DVT: SQ Hep GI: Protonix  Subjective: Feels much better this AM and in no distress at all.  Physical Exam: Filed Vitals:   04/25/11 0839  BP: 142/51  Pulse: 60  Temp:   Resp: 18    Intake/Output Summary (Last 24 hours) at 04/25/11 0937 Last data filed at 04/25/11 0700  Gross per 24 hour  Intake    285 ml  Output   4601 ml  Net  -4316 ml   Neuro: Alert, oriented, moving all ext to command. Cardiac: RRR, Nl S1/S2, -M/R/G. Pulmonary: Bibasilar rales. GI: Soft, NT, ND and +BS. Extremities: -edema and -tenderness.  Labs: CBC    Component Value Date/Time   WBC 10.5 04/25/2011 0542   RBC 3.63* 04/25/2011 0542   HGB 10.4* 04/25/2011 0542   HCT 31.2* 04/25/2011 0542   PLT 212 04/25/2011 0542   MCV 86.0 04/25/2011 0542   MCH 28.7 04/25/2011 0542   MCHC 33.3 04/25/2011 0542   RDW 13.3 04/25/2011 0542   LYMPHSABS 0.9 04/10/2011 1353   MONOABS 0.6 04/10/2011 1353   EOSABS 0.1 04/10/2011 1353   BASOSABS 0.0 04/10/2011 1353   BMET    Component Value Date/Time   NA 131* 04/25/2011 0542   K 4.0 04/25/2011 0542   CL 94* 04/25/2011 0542   CO2 30 04/25/2011 0542   GLUCOSE 266* 04/25/2011 0542   BUN 27* 04/25/2011 0542   CREATININE 1.44* 04/25/2011 0542   CALCIUM 8.7 04/25/2011 0542   GFRNONAA 31* 04/25/2011 0542   GFRAA 36* 04/25/2011 0542   ABG    Component Value Date/Time   HCO3 26.3* 01/20/2010 1830   TCO2 29 01/20/2010 2002   O2SAT 34.1 01/20/2010 1830   No results found for this basename: MG in the last 168 hours Lab Results  Component Value Date   CALCIUM 8.7 04/25/2011   PHOS 3.0 01/21/2010    Chest Xray:   Assessment & Plan: 78 year old female with wheezing after being given rocephin. Steroids, epi, benadryl and H2 blockers given with adequate response.  Now changed to cipro, afebrile and doing well.  Responded very nicely to diureses and is stable. Recommendations: - Agree with cipro.    - Transfer to Tele, will defer to primary.    - No further need for steroids.    - Echo ordered, will f/u.    - Reduce lasix to 20 PO BID.    - Limit narcotics as tolerated.    - IS per RT protocol.    - Titrate O2 for sat of 88-92%.    - PT/OT evaluation.    - F/U BMET.  Koren Bound, MD [phone]

## 2011-04-25 NOTE — Progress Notes (Signed)
Pt voided 125 ml of cloudy pale yellow urine. PVR measured = per bladder scanner. Pt denies pressure or discomfort. Will cont to monitor.

## 2011-04-25 NOTE — Progress Notes (Signed)
  Echocardiogram 2D Echocardiogram has been performed.  Cathie Beams Deneen 04/25/2011, 9:40 AM

## 2011-04-25 NOTE — Progress Notes (Signed)
Brief Transfer Note  Subjective: Interval History: stable, no alert, oriented, no respiratory distress.   Objective: Vital signs in last 24 hours: Temp:  [97.5 F (36.4 C)-98.5 F (36.9 C)] 98.3 F (36.8 C) (03/31 2000) Pulse Rate:  [55-68] 60  (03/31 1609) Resp:  [17-23] 22  (03/31 1609) BP: (109-150)/(45-55) 150/45 mmHg (03/31 1609) SpO2:  [92 %-98 %] 96 % (03/31 1609) Weight:  [81.9 kg (180 lb 8.9 oz)] 81.9 kg (180 lb 8.9 oz) (03/31 0500)  Intake/Output from previous day: 03/30 0701 - 03/31 0700 In: 645 [P.O.:585] Out: 4601 [Urine:4600] Intake/Output this shift: Total I/O In: -  Out: 700 [Urine:700]    Results for orders placed during the hospital encounter of 04/21/11 (from the past 24 hour(s))  BASIC METABOLIC PANEL     Status: Abnormal   Collection Time   04/25/11  5:42 AM      Component Value Range   Sodium 131 (*) 135 - 145 (mEq/L)   Potassium 4.0  3.5 - 5.1 (mEq/L)   Chloride 94 (*) 96 - 112 (mEq/L)   CO2 30  19 - 32 (mEq/L)   Glucose, Bld 266 (*) 70 - 99 (mg/dL)   BUN 27 (*) 6 - 23 (mg/dL)   Creatinine, Ser 4.09 (*) 0.50 - 1.10 (mg/dL)   Calcium 8.7  8.4 - 81.1 (mg/dL)   GFR calc non Af Amer 31 (*) >90 (mL/min)   GFR calc Af Amer 36 (*) >90 (mL/min)  CBC     Status: Abnormal   Collection Time   04/25/11  5:42 AM      Component Value Range   WBC 10.5  4.0 - 10.5 (K/uL)   RBC 3.63 (*) 3.87 - 5.11 (MIL/uL)   Hemoglobin 10.4 (*) 12.0 - 15.0 (g/dL)   HCT 91.4 (*) 78.2 - 46.0 (%)   MCV 86.0  78.0 - 100.0 (fL)   MCH 28.7  26.0 - 34.0 (pg)   MCHC 33.3  30.0 - 36.0 (g/dL)   RDW 95.6  21.3 - 08.6 (%)   Platelets 212  150 - 400 (K/uL)  GLUCOSE, CAPILLARY     Status: Abnormal   Collection Time   04/25/11  8:02 AM      Component Value Range   Glucose-Capillary 243 (*) 70 - 99 (mg/dL)  GLUCOSE, CAPILLARY     Status: Abnormal   Collection Time   04/25/11 12:03 PM      Component Value Range   Glucose-Capillary 272 (*) 70 - 99 (mg/dL)  GLUCOSE, CAPILLARY      Status: Abnormal   Collection Time   04/25/11  3:39 PM      Component Value Range   Glucose-Capillary 271 (*) 70 - 99 (mg/dL)    Studies/Results: Dg Esophagus  04/22/2011  *RADIOLOGY REPORT*  Clinical Data: Occulta swallowing.  Vomiting.  ESOPHOGRAM/BARIUM SWALLOW  Technique:  Single contrast examination was performed using thin barium.  Fluoroscopy time:  0.49 minutes.  Comparison:  Plain film chest 04/21/2011.  Findings:  No stricture, mass or evidence inflammatory change is identified the esophagus.  There were some tertiary contractions present.  No hiatal hernia is visualized.  A 13 mm barium tablet passed easily into the stomach.  IMPRESSION: Mild presbyesophagus.  Negative for stricture or focal abnormality.  Original Report Authenticated By: Bernadene Bell. Maricela Curet, M.D.   Dg Chest Port 1 View  04/24/2011  *RADIOLOGY REPORT*  Clinical Data: Respiratory distress  PORTABLE CHEST - 1 VIEW  Comparison: April 21, 2011  Findings:  Cardiomegaly with pulmonary edema has increased slightly. There are small bilateral pleural effusions now, as well.  IMPRESSION: Slight interval increase in degree of pulmonary edema and the size of small bilateral effusions.  Original Report Authenticated By: Brandon Melnick, M.D.   Dg Chest Port 1 View  04/21/2011  *RADIOLOGY REPORT*  Clinical Data: Weakness and shortness of breath.  PORTABLE CHEST - 1 VIEW  Comparison: Fourth 32,011.  Findings: The heart is mildly enlarged but stable.  There is tortuosity and calcification of the thoracic aorta.  There is moderate vascular congestion and possible mild interstitial edema. No definite pleural effusions or focal infiltrates.  The bony thorax is intact.  Remote AVN involving the right humeral head.  IMPRESSION: Cardiac enlargement with vascular congestion and probable mild interstitial edema.  Original Report Authenticated By: P. Loralie Champagne, M.D.    Scheduled Meds:   . bethanechol  5 mg Oral QID  . calcium carbonate  1  tablet Oral Daily  . carvedilol  6.25 mg Oral BID WC  . ciprofloxacin  250 mg Oral BID  . diltiazem  180 mg Oral Daily  . enoxaparin  30 mg Subcutaneous Q24H  . famotidine (PEPCID) IV  20 mg Intravenous Q12H  . feeding supplement  237 mL Oral QPC supper  . Flora-Q  1 capsule Oral Daily  . furosemide  20 mg Oral BID  . gabapentin  300 mg Oral Daily  . insulin aspart  0-15 Units Subcutaneous TID WC  . insulin aspart  0-5 Units Subcutaneous QHS  . insulin aspart  6 Units Subcutaneous TID WC  . insulin glargine  45 Units Subcutaneous QHS  . isosorbide mononitrate  30 mg Oral Daily  . levothyroxine  225 mcg Oral QAC breakfast  . LORazepam  0.5 mg Oral Once  . mulitivitamin with minerals  1 tablet Oral Daily  . oxybutynin  10 mg Oral QHS  . pantoprazole (PROTONIX) IV  40 mg Intravenous Q24H  . Tamsulosin HCl  0.4 mg Oral Daily  . traZODone  50 mg Oral QHS  . vitamin B-12  1,000 mcg Oral Daily  . vitamin E  100 Units Oral Daily  . DISCONTD: insulin aspart  5 Units Subcutaneous TID WC   Continuous Infusions:  PRN Meds:acetaminophen, acetaminophen, guaiFENesin-codeine, HYDROmorphone, ondansetron (ZOFRAN) IV, ondansetron, paregoric  Assessment/Plan: Respiratory distress: resolved, oxygen saturation 95-98% on 4L [place] - transfer to telemetry See Dr. York Ram progress note 04/25/2011 for details of management of active problems.   LOS: 4 days   Shiryl Ruddy A.

## 2011-04-26 ENCOUNTER — Inpatient Hospital Stay (HOSPITAL_COMMUNITY): Payer: Medicare Other

## 2011-04-26 DIAGNOSIS — J9600 Acute respiratory failure, unspecified whether with hypoxia or hypercapnia: Secondary | ICD-10-CM

## 2011-04-26 LAB — CBC
MCH: 28 pg (ref 26.0–34.0)
Platelets: 293 10*3/uL (ref 150–400)
RBC: 3.5 MIL/uL — ABNORMAL LOW (ref 3.87–5.11)
WBC: 11.9 10*3/uL — ABNORMAL HIGH (ref 4.0–10.5)

## 2011-04-26 LAB — BASIC METABOLIC PANEL
Calcium: 8.7 mg/dL (ref 8.4–10.5)
GFR calc non Af Amer: 29 mL/min — ABNORMAL LOW (ref >90)
Glucose, Bld: 47 mg/dL — ABNORMAL LOW (ref 70–99)
Sodium: 136 mEq/L (ref 135–145)

## 2011-04-26 LAB — GLUCOSE, CAPILLARY
Glucose-Capillary: 204 mg/dL — ABNORMAL HIGH (ref 70–99)
Glucose-Capillary: 234 mg/dL — ABNORMAL HIGH (ref 70–99)

## 2011-04-26 MED ORDER — GLUCOSE 40 % PO GEL
ORAL | Status: AC
  Administered 2011-04-26: 07:00:00
  Filled 2011-04-26: qty 1

## 2011-04-26 MED ORDER — GLUCOSE 40 % PO GEL
ORAL | Status: AC
  Administered 2011-04-26: 08:00:00
  Filled 2011-04-26: qty 1

## 2011-04-26 MED ORDER — INSULIN GLARGINE 100 UNIT/ML ~~LOC~~ SOLN
40.0000 [IU] | Freq: Every day | SUBCUTANEOUS | Status: DC
  Administered 2011-04-26: 40 [IU] via SUBCUTANEOUS

## 2011-04-26 NOTE — Progress Notes (Signed)
Physical Therapy Treatment Patient Details Name: [name] MRN: [id number] DOB: December 02, 1919 Today's Date: 04/26/2011  PT Assessment/Plan  PT - Assessment/Plan Comments on Treatment Session: Pt with anaphylaxis over weekend requiring step down transfer but back on telemetry today.  Pt reports she was confused this morning however correctly answered all orientation questions.  Pt ambulated in hallway on 4L oxygen with SaO2 99% upon return to room. PT Plan: Discharge plan remains appropriate;Frequency remains appropriate Follow Up Recommendations: Skilled nursing facility Equipment Recommended: Defer to next venue PT Goals  Acute Rehab PT Goals PT Goal: Supine/Side to Sit - Progress: Met PT Goal: Sit to Stand - Progress: Progressing toward goal PT Goal: Stand to Sit - Progress: Progressing toward goal PT Goal: Ambulate - Progress: Progressing toward goal  PT Treatment Precautions/Restrictions  Precautions Precautions: Fall Required Braces or Orthoses: No Restrictions Weight Bearing Restrictions: No Mobility (including Balance) Bed Mobility Bed Mobility: Yes Supine to Sit: HOB elevated (Comment degrees);5: Supervision Sit to Supine: 5: Supervision;HOB elevated (comment degrees) (60) Transfers Transfers: Yes Sit to Stand: 4: Min assist;With upper extremity assist;From bed Sit to Stand Details (indicate cue type and reason): min/guard, verbal cues for hand placement Stand to Sit: 4: Min assist;With upper extremity assist;To chair/3-in-1 Stand to Sit Details: verbal cues for armrests to control descent Ambulation/Gait Ambulation/Gait: Yes Ambulation/Gait Assistance: 4: Min assist Ambulation/Gait Assistance Details (indicate cue type and reason): min/guard, verbal cue for posture, very slow cadence Ambulation Distance (Feet): 200 Feet Assistive device: Rolling walker Gait Pattern: Step-through pattern;Trunk flexed    Exercise    End of Session PT - End of Session Equipment  Utilized During Treatment: Gait belt Activity Tolerance: Patient tolerated treatment well Patient left: in chair;with call bell in reach General Behavior During Session: [facility] for tasks performed Cognition: [facility] for tasks performed  [name],[name] 04/26/2011, 1:38 PM Pager: [phone]

## 2011-04-26 NOTE — Progress Notes (Signed)
CBG =42 after 1 tube instant glucose. Pt remains drousy but arousable. 1 more tube of instant glucose administered. Report given to Verlon Au, RN to obtain follow-up CBG.

## 2011-04-26 NOTE — Progress Notes (Signed)
PROGRESS NOTE  [name] JXB:[id number] DOB: [birth year] DOA: 04/21/2011 PCP: [name], MD, MD  Brief narrative: [name] is an 78 year old female with a past medical history of coronary artery disease, diabetes, atrial flutter, hypertension, dyslipidemia, hypothyroidism, and ischemic colitis who presented to the hospital on 04/21/2011 with nausea, vomiting, and feeling unwell. Upon initial evaluation emergency department, the patient was found to have evidence of both a urinary tract infection and right lower extremity cellulitis. She was also dehydrated. She was placed on broad-spectrum antibiotic therapy, which was narrowed 04/23/11.  On 04/24/11, she was transferred to the SDU due to an anaphylactic reaction to Rocephin requiring treatment with epinephrine, Benadryl, Solumedrol and Pepcid.    Assessment/Plan: Principal Problem:  *UTI (lower urinary tract infection) / Urinary retention / Anaphylaxis (Presumed Rocephin induced) Patient was admitted and a urinalysis was done which showed a large amount of leukocytes and too numerous to count white blood cells on microscopy. There were also 7-10 red blood cells and a few bacteria. Nitrites were negative. The patient had urinary retention on admission, as well, prompting [place] to place a foley catheter.  She reports that she has an outpatient appointment with a urologist because she has this issue chronically, which likely precipitated her UTI.  Cultures grew E. Coli, rocephin sensitive, which the patient was put on 04/23/11.  The patient's condition deteriorated with acute respiratory distress and throat swelling, thought to be an anaphylactic reaction to Rocephin requiring emergent treatment and transfer to the SDU, so the foley was left in place.  The Rocephin was discontinued.  Her condition rapidly improved with the administration of epinephrine, Benadryl, Solumedrol and Pepcid.  She was then placed on Cipro based on sensitivities.  The patient  continued to have problems with bladder emptying with PVR's > 250 cc, so the foley was replaced 04/26/11, and will likely need to be left in place until the patient can follow up with her urologist post discharge. Active Problems:  Cellulitis and abscess of leg, except foot / wound eschar of foot The patient does have some asymmetric erythema of her right lower extremity consistent with possible cellulitis. She was initially placed on broad-spectrum antibiotics as noted. Given her diarrhea, and the low suspicion for clinically significant cellulitis, we narrowed antibiotics to Rocephin on 04/21/11.  No further treatment felt to be needed for cellulitis.  Dehydration / Acute renal injury /  Stage III chronic kidney disease The patient has been placed on IV fluids and her diuretics have been placed on hold. Her creatinine has come down with IV fluid hydration. The patient does have significant renal impairment with her GFR ranging from 18 up to 45. This corresponds with stage III-4 chronic kidney disease. We continued to hydrate her until 04/24/11, when she developed acute respiratory distress from anaphylaxis and possible pulmonary edema.   Her baseline creatinine is as low as 1.14 (01/23/10) but most recently, her creatinine was 1.60 on 04/10/2011, and 1.44 today indicating recovery of renal function. Her underlying chronic kidney disease is likely secondary to diabetic nephropathy.  Home diuretic therapy was resumed on 04/25/11.    Altered mental status / toxic encephalopathy Patient likely suffered from toxic encephalopathy related to infections. This point, her mental status is clear.  Hypothyroidism The patient's TSH was checked and was found to be within normal limits. Continue usual dose of Synthroid.  CAD (coronary artery disease) Patient denies chest pain though she is having palpitations. One troponin was checked in the emergency department, and  it was negative. Continue Coreg, Imdur.  History of  atrial flutter Patient is status post ablation and is on rate controlling medications. Her 12-lead EKG showed normal sinus rhythm. Patient has subjective palpitations but her heart sounds regular on exam. Continue Coreg, Cardizem. TSH was normal.    DM (diabetes mellitus) type I uncontrolled with renal manifestation / Hypoglycemia Continue insulin therapy. Insulin initially titrated up based on uncontrolled blood glucoses.  Required glucose for low readings yesterday.  CBGs being monitored closely, and insulin adjusted based on readings.  Hyponatremia May be from hypothyroidism versus pseudo-hyponatremia from elevated blood glucoses. Mild. Resolved.  Hyperkalemia Likely from acute kidney injury in the setting of advanced chronic kidney disease. Patient's potassium has normalized with hydration and improvement in her renal function.  Normocytic anemia Likely anemia of chronic kidney disease. Hemoglobin is stable.  Dysphasia secondary to presbyesophagus Patient complains of subacute regurgitation of food consistent with dysphasia. We asked speech therapy to evaluate her and they recommended an esophogram, which was done on 04/22/11 and showed mild presbyesophagus.  Continue dysphagia 3 diet.  Questionable pulmonary edema on chest radiography Patient initially did not have any clinical signs suggestive of decompensated heart failure. A two-dimensional echocardiogram was ordered by the admitting physician, but was cancelled. Patient's last echocardiogram was done on 06/12/2008 and she had a normal EF at that time. The patient developed acute respiratory distress thought to be secondary to anaphylaxis and pulmonary edema on 04/24/11.  She was given a dose of Lasix, her IVF were decreased to [facility], and her anaphylaxis was appropriately treated, resulting in stabilization of her respiratory function.  2 D Echo results pending.  I&O balance significantly in the negative with resumption of lasix. Chronic  diarrhea The patient normally takes paregoric for this.  It was stopped on admission.  Stool PCR for clostridium difficile was ordered, which was negative.  Will re-order paregoric PRN (Note: not available in our pharmacy, patient was instructed to have family bring from home).  Malignant HTN BP malignantly high 04/24/11.  Given Hydralazine x 2 for this.  Resumed Cardizem on 04/24/11 and was given a dose of IV Lasix as well.  Will resume oral Lasix today.  BP better today.  Code Status: Full Family Communication: Daughter Annice Pih Tel: [phone], left message. Disposition Plan: Home, when stable  Medical Consultants:  Dr. Koren Bound, PCCM  Consultants:  Wound care nurse: F/U podiatrist post discharge for toe ulcer  Dietician: Dysphagia 3 diet for swallowing difficulty  Speech therapy: Esophogram ordered per recs.  Physical therapy: SNF vs HHPT if improves  Occupational therapy  Diabetes Coordinator: Titrate basal insulin until FBS > 140.  Procedures:  12-lead EKG 04/21/2011: SINUS RHYTHM ~ normal P axis, V-rate 50- 99  2 D Echocardiogram 04/25/11: Results pending  Antibiotics:  Zosyn 04/21/11--->04/23/11  Vancomycin 04/21/11--->04/23/11  Rocephin 04/21/2011---> 04/21/2011, 04/23/11--->04/24/11  Cipro 04/25/11--->    Subjective  [name] feels better today.  She does not remember the nursing staff replacing the foley last night.  No significant dyspnea.   Objective    Interim History: Transferred out of SDU last night.   Objective: Filed Vitals:   04/25/11 2200 04/25/11 2245 04/25/11 2333 04/26/11 0546  BP: 129/105 137/96 153/73 150/69  Pulse:  57 58 56  Temp:   98.1 F (36.7 C) 98.6 F (37 C)  TempSrc:   Oral Oral  Resp:  22 20 20   Height:    5\' 8"  (1.727 m)  Weight:    80.7  kg (177 lb 14.6 oz)  SpO2:  99% 99% 96%    Intake/Output Summary (Last 24 hours) at 04/26/11 0752 Last data filed at 04/26/11 9811  Gross per 24 hour  Intake    590 ml   Output   4388 ml  Net  -3798 ml    Exam: Gen:  NAD Cardiovascular:  RRR, No M/R/G Respiratory: Lungs CTAB upper, diminished in the bases Gastrointestinal: Abdomen soft, NT/ND with normal active bowel sounds. Extremities: 1+ edema bilaterally. Mild bilateral erythema.  Very tiny ulcer right great toe with scab  Data Reviewed: Basic Metabolic Panel:  Lab 04/26/11 9147 04/25/11 0542 04/24/11 0424 04/23/11 0505 04/22/11 0506  NA 136 131* 133* 131* 133*  K 3.5 4.0 -- -- --  CL 96 94* 98 94* 96  CO2 33* 30 26 28 28   GLUCOSE 47* 266* 165* 294* 235*  BUN 33* 27* 25* 35* 38*  CREATININE 1.52* 1.44* 1.55* 1.97* 2.22*  CALCIUM 8.7 8.7 8.4 8.2* 8.4  MG -- -- -- -- --  PHOS -- -- -- -- --   GFR Estimated Creatinine Clearance: 28 ml/min (by C-G formula based on Cr of 1.52). Liver Function Tests:  Lab 04/22/11 0506  AST 17  ALT 11  ALKPHOS 74  BILITOT 0.3  PROT 6.1  ALBUMIN 2.5*    CBC:  Lab 04/26/11 0455 04/25/11 0542 04/24/11 0424 04/23/11 0505 04/22/11 0506  WBC 11.9* 10.5 14.5* 16.1* 19.5*  NEUTROABS -- -- -- -- --  HGB 9.8* 10.4* 10.0* 10.0* 10.7*  HCT 29.8* 31.2* 31.0* 30.9* 33.0*  MCV 85.1 86.0 87.6 88.0 88.2  PLT 293 212 204 207 208   Cardiac Enzymes:  Lab 04/24/11 1106 04/21/11 1341  CKTOTAL 85 --  CKMB 2.3 --  CKMBINDEX -- --  TROPONINI <0.30 <0.30    Ref. Range 04/23/2011 05:05 04/24/2011 11:06  Pro B Natriuretic peptide (BNP) Latest Range: 0-450 pg/mL 4651.0 (H) 8616.0 (H)   CBG:  Lab 04/25/11 2213 04/25/11 1539 04/25/11 1203 04/25/11 0802 04/24/11 2158  GLUCAP 189* 271* 272* 243* 341*     Ref. Range 04/21/2011 14:15  Lactic Acid, Venous Latest Range: 0.5-2.2 mmol/L 2.3 (H)   Microbiology Recent Results (from the past 240 hour(s))  URINE CULTURE     Status: Normal   Collection Time   04/21/11  5:52 PM      Component Value Range Status Comment   Specimen Description URINE, CATHETERIZED   Final    Special Requests NONE   Final    Culture  Setup  Time [id number]   Final    Colony Count >=100,000 COLONIES/ML   Final    Culture ESCHERICHIA COLI   Final    Report Status 04/23/2011 FINAL   Final    Organism ID, Bacteria ESCHERICHIA COLI   Final   CULTURE, BLOOD (ROUTINE X 2)     Status: Normal (Preliminary result)   Collection Time   04/21/11 11:05 PM      Component Value Range Status Comment   Specimen Description BLOOD RIGHT HAND   Final    Special Requests BOTTLES DRAWN AEROBIC AND ANAEROBIC 3CC   Final    Culture  Setup Time [id number]   Final    Culture     Final    Value:        BLOOD CULTURE RECEIVED NO GROWTH TO DATE CULTURE WILL BE HELD FOR 5 DAYS BEFORE ISSUING A FINAL NEGATIVE REPORT   Report Status PENDING   Incomplete  CULTURE, BLOOD (ROUTINE X 2)     Status: Normal (Preliminary result)   Collection Time   04/21/11 11:05 PM      Component Value Range Status Comment   Specimen Description BLOOD L HAND   Final    Special Requests BOTTLES DRAWN AEROBIC AND ANAEROBIC 2CC   Final    Culture  Setup Time [id number]   Final    Culture     Final    Value:        BLOOD CULTURE RECEIVED NO GROWTH TO DATE CULTURE WILL BE HELD FOR 5 DAYS BEFORE ISSUING A FINAL NEGATIVE REPORT   Report Status PENDING   Incomplete   CLOSTRIDIUM DIFFICILE BY PCR     Status: Normal   Collection Time   04/23/11  7:17 AM      Component Value Range Status Comment   C difficile by pcr NEGATIVE  NEGATIVE  Final   MRSA PCR SCREENING     Status: Normal   Collection Time   04/24/11 11:52 AM      Component Value Range Status Comment   MRSA by PCR NEGATIVE  NEGATIVE  Final     Studies:  Dg Chest Port 1 View 04/21/2011  *RADIOLOGY REPORT*  Clinical Data: Weakness and shortness of breath.  PORTABLE CHEST - 1 VIEW  Comparison: Fourth 32,011.  Findings: The heart is mildly enlarged but stable.  There is tortuosity and calcification of the thoracic aorta.  There is moderate vascular congestion and possible mild interstitial edema. No definite pleural  effusions or focal infiltrates.  The bony thorax is intact.  Remote AVN involving the right humeral head.  IMPRESSION: Cardiac enlargement with vascular congestion and probable mild interstitial edema.  Original Report Authenticated By: P. Loralie Champagne, M.D.    Dg Esophagus 04/22/2011  IMPRESSION: Mild presbyesophagus.  Negative for stricture or focal abnormality.  Original Report Authenticated By: Bernadene Bell. D'ALESSIO, M.D.    Dg Chest Port 1 View 04/24/2011  IMPRESSION: Slight interval increase in degree of pulmonary edema and the size of small bilateral effusions.  Original Report Authenticated By: Brandon Melnick, M.D.    Scheduled Meds:    . bethanechol  5 mg Oral QID  . calcium carbonate  1 tablet Oral Daily  . carvedilol  6.25 mg Oral BID WC  . ciprofloxacin  250 mg Oral BID  . dextrose      . dextrose      . diltiazem  180 mg Oral Daily  . enoxaparin  30 mg Subcutaneous Q24H  . famotidine (PEPCID) IV  20 mg Intravenous Q12H  . feeding supplement  237 mL Oral QPC supper  . Flora-Q  1 capsule Oral Daily  . furosemide  20 mg Oral BID  . gabapentin  300 mg Oral Daily  . insulin aspart  0-15 Units Subcutaneous TID WC  . insulin aspart  0-5 Units Subcutaneous QHS  . insulin aspart  6 Units Subcutaneous TID WC  . insulin glargine  45 Units Subcutaneous QHS  . isosorbide mononitrate  30 mg Oral Daily  . levothyroxine  225 mcg Oral QAC breakfast  . mulitivitamin with minerals  1 tablet Oral Daily  . oxybutynin  10 mg Oral QHS  . pantoprazole (PROTONIX) IV  40 mg Intravenous Q24H  . Tamsulosin HCl  0.4 mg Oral Daily  . traZODone  50 mg Oral QHS  . vitamin B-12  1,000 mcg Oral Daily  . vitamin E  100 Units Oral Daily  Continuous Infusions:      LOS: 5 days   Hillery Aldo, MD Pager [phone]  04/26/2011, 7:52 AM

## 2011-04-26 NOTE — Progress Notes (Addendum)
CBG 41, Pt diaphoretic and lethargic, but arousable. 1 tube glucose gel given. Will monitor.

## 2011-04-26 NOTE — Progress Notes (Signed)
HPI:  78 year old female with PMH of CAD and diabetes who presented to the hospital with cellulitis and UTI. Grew E. Coli and was narrowed from vanc/zosyn to rocephin. After the second dose of rocephin the patient developed rigors and concern for anaphylactic reaction. Patient was transferred to the ICU for closer monitoring and PCCM was called on consultation.  Antibiotics:   Vanc 3/30>>> 3/31 Zosyn 3/30>>>3/31 Cipro 3/31>>>  Cultures/Sepsis Markers:   Urine 3/27>>>E coli  MRSA Neg  C. Diff neg  Blood 3/27>>>Neg  Access/Protocols:  PIV  Best Practice: DVT: SQ Hep GI: Protonix  Subjective: Sitting up in chair , denies cough or dyspnea but still on 02 at 4lpm  Physical Exam: BP 185/70  Pulse 61  Temp(Src) 98.1 F (36.7 C) (Oral)  Resp 20  Ht 5\' 8"  (1.727 m)  Wt 177 lb 14.6 oz (80.7 kg)  BMI 27.05 kg/m2  SpO2 98%  4lpm  Intake/Output Summary (Last 24 hours) at 04/26/11 1240 Last data filed at 04/26/11 1010  Gross per 24 hour  Intake    950 ml  Output   4838 ml  Net  -3888 ml   Neuro: Alert, oriented, moving all ext to command. Cardiac: RRR, Nl S1/S2, -M/R/G. Pulmonary: coarse BS  GI: Soft, NT, ND and +BS. Extremities: -edema and -tenderness.  Labs:  Lab 04/26/11 0455 04/25/11 0542 04/24/11 0424  NA 136 131* 133*  K 3.5 4.0 3.9  CL 96 94* 98  CO2 33* 30 26  BUN 33* 27* 25*  CREATININE 1.52* 1.44* 1.55*  GLUCOSE 47* 266* 165*    Lab 04/26/11 0455 04/25/11 0542 04/24/11 0424  HGB 9.8* 10.4* 10.0*  HCT 29.8* 31.2* 31.0*  WBC 11.9* 10.5 14.5*  PLT 293 212 204       Assessment & Plan: 78 year old female with wheezing after being given rocephin. Steroids, epi, benadryl and H2 blockers given with adequate response.  Now changed to cipro, afebrile and doing well.  Responded very nicely to diureses and is improved though still 02 dep with ali pattern on cxr    Recommendations:     - Agree with cipro.      - Limit narcotics as tolerated.    - IS per RT  protocol.    - Titrate O2 off for sat of 88-92%.    - PT/OT                                     - Pulmonary/CCM f/u prn   PARRETT,TAMMY, NP-C   Pt independently  seen and examined and available cxr's reviewed and I agree with above findings/ imp/ plan   Sandrea Hughs, MD Pulmonary and Critical Care Medicine [facility] Cell [phone]

## 2011-04-26 NOTE — Progress Notes (Signed)
Occupational Therapy Treatment Patient Details Name: [name] MRN: [id number] DOB: 05-Oct-1919 Today's Date: 04/26/2011  OT Assessment/Plan OT Assessment/Plan Comments on Treatment Session: pt has girl 4 hours day/pt was independent with adls pta OT Frequency: Min 1X/week Follow Up Recommendations: Skilled nursing facility Equipment Recommended: Defer to next venue OT Goals ADL Goals Pt Will Perform Lower Body Bathing: with supervision;Sit to stand from bed;Sit to stand from chair ADL Goal: Lower Body Bathing - Progress: Progressing toward goals  OT Treatment Precautions/Restrictions  Precautions Precautions: Fall Restrictions Weight Bearing Restrictions: No   ADL ADL Lower Body Bathing: Performed;Minimal assistance Where Assessed - Lower Body Bathing: Sit to stand from bed ADL Comments: pt didn't sleep well.  Agreeable to bathing but didn't want to get up; no toileting needs Mobility  Bed Mobility Bed Mobility: Yes Supine to Sit: HOB elevated (Comment degrees);5: Supervision;Other (comment) (60) Sit to Supine: 5: Supervision;HOB elevated (comment degrees) (60) Transfers Sit to Stand: 4: Min assist;Other (comment) (min guard) Exercises    End of Session OT - End of Session Activity Tolerance: Patient limited by fatigue Patient left: in bed;with call bell in reach General Behavior During Session: [facility] for tasks performed Cognition: [facility] for tasks performed [facility], OTR/L [phone] 04/26/2011 [name]  04/26/2011, 11:05 AM

## 2011-04-27 ENCOUNTER — Encounter (HOSPITAL_COMMUNITY): Payer: Self-pay | Admitting: Internal Medicine

## 2011-04-27 DIAGNOSIS — I5033 Acute on chronic diastolic (congestive) heart failure: Secondary | ICD-10-CM | POA: Diagnosis present

## 2011-04-27 LAB — CBC
MCH: 29.2 pg (ref 26.0–34.0)
MCHC: 33.5 g/dL (ref 30.0–36.0)
MCV: 87.2 fL (ref 78.0–100.0)
Platelets: 305 10*3/uL (ref 150–400)
RDW: 13.5 % (ref 11.5–15.5)

## 2011-04-27 LAB — BASIC METABOLIC PANEL
BUN: 32 mg/dL — ABNORMAL HIGH (ref 6–23)
Calcium: 8.5 mg/dL (ref 8.4–10.5)
Creatinine, Ser: 1.46 mg/dL — ABNORMAL HIGH (ref 0.50–1.10)
GFR calc non Af Amer: 31 mL/min — ABNORMAL LOW (ref >90)
Glucose, Bld: 250 mg/dL — ABNORMAL HIGH (ref 70–99)
Sodium: 134 mEq/L — ABNORMAL LOW (ref 135–145)

## 2011-04-27 LAB — PRO B NATRIURETIC PEPTIDE: Pro B Natriuretic peptide (BNP): 2796 pg/mL — ABNORMAL HIGH (ref 0–450)

## 2011-04-27 MED ORDER — FLORA-Q PO CAPS: 1.0000 | ORAL_CAPSULE | Freq: Every day | ORAL | Status: DC

## 2011-04-27 MED ORDER — LEVOTHYROXINE SODIUM 75 MCG PO TABS: 225.0000 ug | ORAL_TABLET | Freq: Every day | ORAL | Status: DC

## 2011-04-27 MED ORDER — CIPROFLOXACIN HCL 250 MG PO TABS: 250.0000 mg | ORAL_TABLET | Freq: Two times a day (BID) | ORAL | Status: AC

## 2011-04-27 MED ORDER — INSULIN ASPART 100 UNIT/ML ~~LOC~~ SOLN: 0.0000 [IU] | Freq: Three times a day (TID) | SUBCUTANEOUS | Status: DC

## 2011-04-27 MED ORDER — INSULIN ASPART 100 UNIT/ML ~~LOC~~ SOLN: 0.0000 [IU] | Freq: Every day | SUBCUTANEOUS | Status: DC

## 2011-04-27 MED ORDER — INSULIN GLARGINE 100 UNIT/ML ~~LOC~~ SOLN: 40.0000 [IU] | Freq: Every day | SUBCUTANEOUS | Status: DC

## 2011-04-27 MED ORDER — PANTOPRAZOLE SODIUM 40 MG PO TBEC
40.0000 mg | DELAYED_RELEASE_TABLET | Freq: Every day | ORAL | Status: DC
  Administered 2011-04-27: 40 mg via ORAL

## 2011-04-27 NOTE — Progress Notes (Signed)
Ms. Vitelli will go to [facility] via caregiver. The dc summary and med list were faxed prior to her leaving the hospital.   Gretta Cool, LCSW, Assistant Director [facility]

## 2011-04-27 NOTE — Discharge Summary (Signed)
Physician Discharge Summary  Patient ID: [name] MRN: [id number] DOB/AGE: 09/28/1919 78 y.o.  Admit date: 04/21/2011 Discharge date: 04/27/2011  Primary Care Physician:  [name], MD, MD Urologist: Dr. Heloise Purpura, MD   Discharge Diagnoses:    Present on Admission:  .UTI (lower urinary tract infection) .Cellulitis and abscess of leg, except foot .Dehydration .Toxic encephalopathy .Hypothyroidism .Acute renal injury .CAD (coronary artery disease) .DM (diabetes mellitus) type I uncontrolled with renal manifestation .Hyponatremia .Hyperkalemia .Stage III chronic kidney disease .Normocytic anemia .Dysphasia .Wound eschar of foot .Presbyesophagus .DIARRHEA, CHRONIC .Urinary retention with incomplete bladder emptying .HTN (hypertension), malignant .Anaphylactic reaction .Hypoxemia .Wheezing .Anaphylaxis .Diastolic CHF, acute on chronic  Discharge Medications:  Medication List  As of 04/27/2011 10:12 AM   STOP taking these medications         insulin glulisine 100 UNIT/ML injection      levothyroxine 25 MCG tablet         TAKE these medications         acetaminophen 500 MG tablet   Commonly known as: TYLENOL   Take 500 mg by mouth every 6 (six) hours as needed.      [place] Powd   1 capsule by Does not apply route as needed.      calcium carbonate 1250 MG tablet   Commonly known as: OS-CAL - dosed in mg of elemental calcium   Take 1 tablet by mouth daily.      carvedilol 6.25 MG tablet   Commonly known as: COREG   Take 6.25 mg by mouth 2 (two) times daily with a meal.      ciprofloxacin 250 MG tablet   Commonly known as: CIPRO   Take 1 tablet (250 mg total) by mouth 2 (two) times daily. Take twice a day through 04/28/11, then once a day until she follows up with her urologist for UTI supression      diltiazem 180 MG 24 hr capsule   Commonly known as: CARDIZEM CD   Take 180 mg by mouth daily.      Flora-Q Caps   Take 1 capsule by mouth daily.        furosemide 20 MG tablet   Commonly known as: LASIX   Take 20 mg by mouth 2 (two) times daily.      gabapentin 300 MG capsule   Commonly known as: NEURONTIN   Take 300 mg by mouth daily.      insulin aspart 100 UNIT/ML injection   Commonly known as: novoLOG   Inject 0-15 Units into the skin 3 (three) times daily with meals. Use moderate scale.      insulin aspart 100 UNIT/ML injection   Commonly known as: novoLOG   Inject 0-5 Units into the skin at bedtime.      insulin glargine 100 UNIT/ML injection   Commonly known as: LANTUS   Inject 40 Units into the skin at bedtime. Use moderate scale.      isosorbide mononitrate 30 MG 24 hr tablet   Commonly known as: IMDUR   Take 30 mg by mouth daily.      levothyroxine 75 MCG tablet   Commonly known as: SYNTHROID, LEVOTHROID   Take 3 tablets (225 mcg total) by mouth daily before breakfast.      mulitivitamin with minerals Tabs   Take 1 tablet by mouth daily.      oxybutynin 5 MG tablet   Commonly known as: DITROPAN   Take 10 mg by mouth at bedtime.  paregoric 2 MG/5ML solution   Take 10 mLs by mouth 4 (four) times daily as needed. For bowel movement      Tamsulosin HCl 0.4 MG Caps   Commonly known as: FLOMAX   Take 0.4 mg by mouth daily.      traZODone 50 MG tablet   Commonly known as: DESYREL   Take 50 mg by mouth at bedtime.      vitamin B-12 1000 MCG tablet   Commonly known as: CYANOCOBALAMIN   Take 1,000 mcg by mouth daily.      vitamin E 100 UNIT capsule   Take 100 Units by mouth daily.             Disposition and Follow-up: The patient is being discharged to [facility] for rehabilitation.  She should follow up with her PCP in 2 weeks, and with her urologist at her scheduled appointment 06/02/11 at 12:45 pm.   Medical Consults:  Dr. Koren Bound, PCCM  Other Consults:  Wound care nurse: F/U podiatrist post discharge for toe ulcer   Dietician: Dysphagia 3 diet for swallowing difficulty    Speech therapy: Esophogram ordered per recs.   Physical therapy: SNF vs HHPT if improves   Occupational therapy   Diabetes Coordinator: Titrate basal insulin until FBS > 140.   Procedures and Diagnostic Studies:    12-lead EKG 04/21/2011: SINUS RHYTHM ~ normal P axis, V-rate 50- 99  Dg Chest Port 1 View 04/21/2011 *RADIOLOGY REPORT* Clinical Data: Weakness and shortness of breath. PORTABLE CHEST - 1 VIEW Comparison: Fourth 32,011. Findings: The heart is mildly enlarged but stable. There is tortuosity and calcification of the thoracic aorta. There is moderate vascular congestion and possible mild interstitial edema. No definite pleural effusions or focal infiltrates. The bony thorax is intact. Remote AVN involving the right humeral head. IMPRESSION: Cardiac enlargement with vascular congestion and probable mild interstitial edema. Original Report Authenticated By: P. Loralie Champagne, M.D.   Dg Esophagus 04/22/2011 IMPRESSION: Mild presbyesophagus. Negative for stricture or focal abnormality. Original Report Authenticated By: Bernadene Bell. D'ALESSIO, M.D.   Dg Chest Port 1 View 04/24/2011 IMPRESSION: Slight interval increase in degree of pulmonary edema and the size of small bilateral effusions. Original Report Authenticated By: Brandon Melnick, M.D.   2 D Echocardiogram 04/25/11: Study Conclusions - Left ventricle: The cavity size was normal. There was mild concentric hypertrophy. Systolic function was normal. The estimated ejection fraction was in the range of 55% to 60%. Wall motion was normal; there were no regional wall motion abnormalities. Doppler parameters are consistent with abnormal left ventricular relaxation (grade 1 diastolic dysfunction). The E/e' ratio is >15, suggesting markedly elevated LV filling pressure. - Aortic valve: Sclerosis without stenosis. - Aorta: The aorta was mildly calcified. - Mitral valve: Calcified annulus. Trivial regurgitation. - Right atrium: Pacemaker  leads were not visualized. The atrium was mildly dilated. - Atrial septum: There was increased thickness of the septum, consistent with lipomatous hypertrophy. - Tricuspid valve: Moderate regurgitation. - Pulmonary arteries: PA peak pressure: 41mm Hg (S). - Pericardium, extracardiac: There was no pericardial Effusion.  Dg Chest 1 View 04/26/2011 IMPRESSION: No significant interval change. Original Report Authenticated By: Cyndie Chime, M.D.   Discharge Laboratory Values: Basic Metabolic Panel:  Lab 04/27/11 1610 04/26/11 0455 04/25/11 0542 04/24/11 0424 04/23/11 0505  NA 134* 136 131* 133* 131*  K 3.9 3.5 -- -- --  CL 94* 96 94* 98 94*  CO2 31 33* 30 26 28   GLUCOSE 250*  47* 266* 165* 294*  BUN 32* 33* 27* 25* 35*  CREATININE 1.46* 1.52* 1.44* 1.55* 1.97*  CALCIUM 8.5 8.7 8.7 8.4 8.2*  MG -- -- -- -- --  PHOS -- -- -- -- --   GFR Estimated Creatinine Clearance: 29 ml/min (by C-G formula based on Cr of 1.46). Liver Function Tests:  Lab 04/22/11 0506  AST 17  ALT 11  ALKPHOS 74  BILITOT 0.3  PROT 6.1  ALBUMIN 2.5*    CBC:  Lab 04/27/11 0450 04/26/11 0455 04/25/11 0542 04/24/11 0424 04/23/11 0505  WBC 11.7* 11.9* 10.5 14.5* 16.1*  NEUTROABS -- -- -- -- --  HGB 10.5* 9.8* 10.4* 10.0* 10.0*  HCT 31.3* 29.8* 31.2* 31.0* 30.9*  MCV 87.2 85.1 86.0 87.6 88.0  PLT 305 293 212 204 207   Cardiac Enzymes:  Lab 04/24/11 1106 04/21/11 1341  CKTOTAL 85 --  CKMB 2.3 --  CKMBINDEX -- --  TROPONINI <0.30 <0.30   Pro BNP:   Ref. Range  04/23/2011 05:05  04/24/2011 11:06  04/27/2011 04:50   Pro B Natriuretic peptide (BNP)  Latest Range: 0-450 pg/mL  4651.0 (H)  8616.0 (H)  2796.0 (   CBG:  Lab 04/27/11 0757 04/26/11 2111 04/26/11 1646 04/26/11 1129 04/26/11 0751  GLUCAP 183* 259* 234* 204* 88   Microbiology Recent Results (from the past 240 hour(s))  URINE CULTURE     Status: Normal   Collection Time   04/21/11  5:52 PM      Component Value Range Status Comment   Specimen  Description URINE, CATHETERIZED   Final    Special Requests NONE   Final    Culture  Setup Time [id number]   Final    Colony Count >=100,000 COLONIES/ML   Final    Culture ESCHERICHIA COLI   Final    Report Status 04/23/2011 FINAL   Final    Organism ID, Bacteria ESCHERICHIA COLI   Final   CULTURE, BLOOD (ROUTINE X 2)     Status: Normal (Preliminary result)   Collection Time   04/21/11 11:05 PM      Component Value Range Status Comment   Specimen Description BLOOD RIGHT HAND   Final    Special Requests BOTTLES DRAWN AEROBIC AND ANAEROBIC 3CC   Final    Culture  Setup Time [id number]   Final    Culture     Final    Value:        BLOOD CULTURE RECEIVED NO GROWTH TO DATE CULTURE WILL BE HELD FOR 5 DAYS BEFORE ISSUING A FINAL NEGATIVE REPORT   Report Status PENDING   Incomplete   CULTURE, BLOOD (ROUTINE X 2)     Status: Normal (Preliminary result)   Collection Time   04/21/11 11:05 PM      Component Value Range Status Comment   Specimen Description BLOOD L HAND   Final    Special Requests BOTTLES DRAWN AEROBIC AND ANAEROBIC 2CC   Final    Culture  Setup Time [id number]   Final    Culture     Final    Value:        BLOOD CULTURE RECEIVED NO GROWTH TO DATE CULTURE WILL BE HELD FOR 5 DAYS BEFORE ISSUING A FINAL NEGATIVE REPORT   Report Status PENDING   Incomplete   CLOSTRIDIUM DIFFICILE BY PCR     Status: Normal   Collection Time   04/23/11  7:17 AM      Component Value Range Status Comment  C difficile by pcr NEGATIVE  NEGATIVE  Final   MRSA PCR SCREENING     Status: Normal   Collection Time   04/24/11 11:52 AM      Component Value Range Status Comment   MRSA by PCR NEGATIVE  NEGATIVE  Final      Brief H and P: For complete details please refer to admission H and P, but in brief, Mrs. Weimann is an 78 year old female with a past medical history of coronary artery disease, diabetes, atrial flutter, hypertension, dyslipidemia, hypothyroidism, and ischemic colitis who presented to  the hospital on 04/21/2011 with nausea, vomiting, and feeling unwell. Upon initial evaluation emergency department, the patient was found to have evidence of both a urinary tract infection and right lower extremity cellulitis. She was also dehydrated.   Physical Exam at Discharge: BP 156/68  Pulse 57  Temp(Src) 97.2 F (36.2 C) (Oral)  Resp 20  Ht 5\' 8"  (1.727 m)  Wt 79.9 kg (176 lb 2.4 oz)  BMI 26.78 kg/m2  SpO2 98% Gen: NAD  Cardiovascular: RRR, No M/R/G  Respiratory: Lungs CTAB upper, diminished in the bases  Gastrointestinal: Abdomen soft, NT/ND with normal active bowel sounds.  Extremities: 1+ edema bilaterally. Mild bilateral erythema. Very tiny ulcer right great toe with callous, and ventral foot ulcer with callous.  Hospital Course:  Principal Problem:  *UTI (lower urinary tract infection) / Urinary retention / Anaphylaxis (Presumed Rocephin induced)  Patient was admitted and a urinalysis was done which showed a large amount of leukocytes and too numerous to count white blood cells on microscopy. There were also 7-10 red blood cells and a few bacteria. Nitrites were negative. The patient had urinary retention on admission, as well, prompting [place] to place a foley catheter. She reports that she has an outpatient appointment with a urologist because she has this issue chronically, which likely precipitated her UTI. Cultures grew E. Coli, rocephin sensitive, which the patient was put on 04/23/11. The patient's condition deteriorated with acute respiratory distress and throat swelling, thought to be an anaphylactic reaction to Rocephin requiring emergent treatment and transfer to the SDU, so the foley was left in place. The Rocephin was discontinued. Her condition rapidly improved with the administration of epinephrine, Benadryl, Solumedrol and Pepcid. She was then placed on Cipro based on sensitivities. The patient continued to have problems with bladder emptying with PVR's > 250 cc, so the  foley was replaced 04/26/11, and will likely need to be left in place until the patient can follow up with her urologist post discharge. She is currently on day #6 of antibiotic therapy, which will be continued at full dosing x 1 more day, then changed to prophylactic dosing until she follows up with her urologist, given the patient's problems with urinary retention, fecal incontinence and need for chronic indwelling catheter  Active Problems:  Cellulitis and abscess of leg, except foot / wound eschar of foot  The patient does have some asymmetric erythema of her right lower extremity consistent with possible cellulitis. She was initially placed on broad-spectrum antibiotics as noted. Given her diarrhea, and the low suspicion for clinically significant cellulitis, we narrowed antibiotics to Rocephin on 04/21/11. No further treatment felt to be needed for cellulitis.  Dehydration / Acute renal injury / Stage III chronic kidney disease  The patient has been placed on IV fluids and her diuretics have been placed on hold. Her creatinine has come down with IV fluid hydration. The patient does have significant renal impairment with  her GFR ranging from 18 up to 45. This corresponds with stage III-4 chronic kidney disease. We continued to hydrate her until 04/24/11, when she developed acute respiratory distress from anaphylaxis and possible pulmonary edema. Her baseline creatinine is as low as 1.14 (01/23/10) but most recently, her creatinine was 1.60 on 04/10/2011, and 1.44 today indicating recovery of renal function. Her underlying chronic kidney disease is likely secondary to diabetic nephropathy. Home diuretic therapy was resumed on 04/25/11, and her kidney function remains stable on diuretic therapy.  Altered mental status / toxic encephalopathy  Patient likely suffered from toxic encephalopathy related to infections. At this point, her mental status is clear.  Hypothyroidism  The patient's TSH was checked and was  found to be within normal limits. Continue usual dose of Synthroid.  CAD (coronary artery disease)  Patient denies chest pain though she reported having palpitations on the date of admission. One troponin was checked in the emergency department, and it was negative. Continue Coreg, Imdur.  History of atrial flutter  Patient is status post ablation and is on rate controlling medications. Her 12-lead EKG showed normal sinus rhythm. Patient had subjective palpitations but her heart sounded regular on exam. Continue Coreg, Cardizem. TSH was normal.  DM (diabetes mellitus) type I uncontrolled with renal manifestation / Hypoglycemia  Continue insulin therapy. Insulin initially titrated up based on uncontrolled blood glucoses. Required glucose for low readings yesterday. CBGs being monitored closely, and insulin adjusted based on readings, which have ranged from 42-259 over past 24 hours. No lows since insulin adjusted. Recommend close follow up of CBGs and titration of insulin to achieve better glycemic control. Hyponatremia  May be from hypothyroidism versus pseudo-hyponatremia from elevated blood glucoses. Mild.  Hyperkalemia  Likely from acute kidney injury in the setting of advanced chronic kidney disease. Patient's potassium has normalized with hydration and improvement in her renal function.  Normocytic anemia  Likely anemia of chronic kidney disease. Hemoglobin is stable.  Dysphasia secondary to presbyesophagus  Patient complains of subacute regurgitation of food consistent with dysphasia. We asked speech therapy to evaluate her and they recommended an esophogram, which was done on 04/22/11 and showed mild presbyesophagus. Continue dysphagia 3 diet.  Questionable pulmonary edema on chest radiography / Acute on chronic diastolic CHF  Patient initially did not have any clinical signs suggestive of decompensated heart failure. A two-dimensional echocardiogram was ordered by the admitting physician, but  was cancelled. Patient's last echocardiogram was done on 06/12/2008 and she had a normal EF at that time. The patient developed acute respiratory distress thought to be secondary to anaphylaxis and pulmonary edema on 04/24/11. She was given a dose of Lasix, her IVF were decreased to [facility], and her anaphylaxis was appropriately treated, resulting in stabilization of her respiratory function. 2 D Echo results show no significant change from prior echo in 2010. Suspect acute pulmonary edema secondary to anaphylaxis in the setting of chronic diastolic CHF. Continue lasix, I&O balance continues to be in the negative with resumption of lasix, and pro BNP levels improved.  Chronic diarrhea  The patient normally takes paregoric for this. It was stopped on admission. Stool PCR for clostridium difficile was ordered, which was negative. Will re-order paregoric PRN (Note: not available in our pharmacy, patient was instructed to have family bring from home).  Malignant HTN  BP malignantly high 04/24/11. Given Hydralazine x 2 for this. Resumed Cardizem on 04/24/11 and was given a dose of IV Lasix as well. Will resume oral Lasix today. BP  better today.    Recommendations for hospital follow-up: 1.  Leave foley in place until she follows up with Dr. Laverle Patter on 06/02/11 at 12:45 pm. 2.  Continue to monitor and adjust insulin based on CBG values.  Diet:  Carbohydrate modified  Activity:  Increase activity slowly, up with assistance, with walker  Condition at Discharge:   Improved  Time spent on Discharge:  40 minutes  Signed: Dr. Trula Ore Tamira Ryland Pager [phone] 04/27/2011, 10:12 AM

## 2011-04-27 NOTE — Progress Notes (Signed)
The patient is receiving Protonix by the intravenous route.  Based on criteria approved by the Pharmacy and Therapeutics Committee and the Medical Executive Committee, the medication is being converted to the equivalent oral dose form.  These criteria include: -No Active GI bleeding -Able to tolerate diet of full liquids (or better) or tube feeding -Able to tolerate other medications by the oral or enteral route  If you have any questions about this conversion, please contact the Pharmacy Department (ext [phone]).  Thank you.  Lynann Beaver PharmD, BCPS Pager [phone] 04/27/2011 9:26 AM

## 2011-04-27 NOTE — Discharge Instructions (Signed)

## 2011-04-27 NOTE — Progress Notes (Addendum)
PROGRESS NOTE  [name] NWG:[id number] DOB: 1919-01-29 DOA: 04/21/2011 PCP: [name], MD, MD  Brief narrative: [name] is an 78 year old female with a past medical history of coronary artery disease, diabetes, atrial flutter, hypertension, dyslipidemia, hypothyroidism, and ischemic colitis who presented to the hospital on 04/21/2011 with nausea, vomiting, and feeling unwell. Upon initial evaluation emergency department, the patient was found to have evidence of both a urinary tract infection and right lower extremity cellulitis. She was also dehydrated. She was placed on broad-spectrum antibiotic therapy, which was narrowed 04/23/11.  On 04/24/11, she was transferred to the SDU due to an anaphylactic reaction to Rocephin requiring treatment with epinephrine, Benadryl, Solumedrol and Pepcid.    Assessment/Plan: Principal Problem:  *UTI (lower urinary tract infection) / Urinary retention / Anaphylaxis (Presumed Rocephin induced) Patient was admitted and a urinalysis was done which showed a large amount of leukocytes and too numerous to count white blood cells on microscopy. There were also 7-10 red blood cells and a few bacteria. Nitrites were negative. The patient had urinary retention on admission, as well, prompting [place] to place a foley catheter.  She reports that she has an outpatient appointment with a urologist because she has this issue chronically, which likely precipitated her UTI.  Cultures grew E. Coli, rocephin sensitive, which the patient was put on 04/23/11.  The patient's condition deteriorated with acute respiratory distress and throat swelling, thought to be an anaphylactic reaction to Rocephin requiring emergent treatment and transfer to the SDU, so the foley was left in place.  The Rocephin was discontinued.  Her condition rapidly improved with the administration of epinephrine, Benadryl, Solumedrol and Pepcid.  She was then placed on Cipro based on sensitivities.  The patient  continued to have problems with bladder emptying with PVR's > 250 cc, so the foley was replaced 04/26/11, and will likely need to be left in place until the patient can follow up with her urologist post discharge.  She is currently on day #6 of antibiotic therapy, which will be continued at full dosing x 1 more day, then changed to prophylaxis dosing until she follows up with her urologist, given the patient's problems with urinary retention, fecal incontinence and need for chronic indwelling catheter Active Problems:  Cellulitis and abscess of leg, except foot / wound eschar of foot The patient does have some asymmetric erythema of her right lower extremity consistent with possible cellulitis. She was initially placed on broad-spectrum antibiotics as noted. Given her diarrhea, and the low suspicion for clinically significant cellulitis, we narrowed antibiotics to Rocephin on 04/21/11.  No further treatment felt to be needed for cellulitis.  Dehydration / Acute renal injury /  Stage III chronic kidney disease The patient has been placed on IV fluids and her diuretics have been placed on hold. Her creatinine has come down with IV fluid hydration. The patient does have significant renal impairment with her GFR ranging from 18 up to 45. This corresponds with stage III-4 chronic kidney disease. We continued to hydrate her until 04/24/11, when she developed acute respiratory distress from anaphylaxis and possible pulmonary edema.   Her baseline creatinine is as low as 1.14 (01/23/10) but most recently, her creatinine was 1.60 on 04/10/2011, and 1.44 today indicating recovery of renal function. Her underlying chronic kidney disease is likely secondary to diabetic nephropathy.  Home diuretic therapy was resumed on 04/25/11, and her kidney function remains stable on diuretic therapy.    Altered mental status / toxic encephalopathy Patient likely  suffered from toxic encephalopathy related to infections. This point, her  mental status is clear.  Hypothyroidism The patient's TSH was checked and was found to be within normal limits. Continue usual dose of Synthroid.  CAD (coronary artery disease) Patient denies chest pain though she is having palpitations. One troponin was checked in the emergency department, and it was negative. Continue Coreg, Imdur.  History of atrial flutter Patient is status post ablation and is on rate controlling medications. Her 12-lead EKG showed normal sinus rhythm. Patient has subjective palpitations but her heart sounds regular on exam. Continue Coreg, Cardizem. TSH was normal.    DM (diabetes mellitus) type I uncontrolled with renal manifestation / Hypoglycemia Continue insulin therapy. Insulin initially titrated up based on uncontrolled blood glucoses.  Required glucose for low readings yesterday.  CBGs being monitored closely, and insulin adjusted based on readings, which have ranged from 42-259 over past 24 hours.  No lows since insulin adjusted.  Hyponatremia May be from hypothyroidism versus pseudo-hyponatremia from elevated blood glucoses. Mild. Resolved.  Hyperkalemia Likely from acute kidney injury in the setting of advanced chronic kidney disease. Patient's potassium has normalized with hydration and improvement in her renal function.  Normocytic anemia Likely anemia of chronic kidney disease. Hemoglobin is stable.  Dysphasia secondary to presbyesophagus Patient complains of subacute regurgitation of food consistent with dysphasia. We asked speech therapy to evaluate her and they recommended an esophogram, which was done on 04/22/11 and showed mild presbyesophagus.  Continue dysphagia 3 diet.  Questionable pulmonary edema on chest radiography / Acute on chronic diastolic CHF Patient initially did not have any clinical signs suggestive of decompensated heart failure. A two-dimensional echocardiogram was ordered by the admitting physician, but was cancelled. Patient's last  echocardiogram was done on 06/12/2008 and she had a normal EF at that time. The patient developed acute respiratory distress thought to be secondary to anaphylaxis and pulmonary edema on 04/24/11.  She was given a dose of Lasix, her IVF were decreased to [facility], and her anaphylaxis was appropriately treated, resulting in stabilization of her respiratory function.  2 D Echo results show no significant change from prior echo in 2010.  Suspect acute pulmonary edema secondary to anaphylaxis in the setting of chronic diastolic CHF.  Continue lasix, I&O balance continues to be in the negative with resumption of lasix, and pro BNP levels improved. Chronic diarrhea The patient normally takes paregoric for this.  It was stopped on admission.  Stool PCR for clostridium difficile was ordered, which was negative.  Will re-order paregoric PRN (Note: not available in our pharmacy, patient was instructed to have family bring from home).  Malignant HTN BP malignantly high 04/24/11.  Given Hydralazine x 2 for this.  Resumed Cardizem on 04/24/11 and was given a dose of IV Lasix as well.  Will resume oral Lasix today.  BP better today.  Code Status: Full Family Communication: Daughter Annice Pih Tel: [phone], left message on answering machine. Disposition Plan: Home, when stable  Medical Consultants:  Dr. Koren Bound, PCCM  Consultants:  Wound care nurse: F/U podiatrist post discharge for toe ulcer  Dietician: Dysphagia 3 diet for swallowing difficulty  Speech therapy: Esophogram ordered per recs.  Physical therapy: SNF vs HHPT if improves  Occupational therapy  Diabetes Coordinator: Titrate basal insulin until FBS > 140.  Procedures:  12-lead EKG 04/21/2011: SINUS RHYTHM ~ normal P axis, V-rate 50- 99  2 D Echocardiogram 04/25/11: Study Conclusions - Left ventricle: The cavity size was normal. There was  mild concentric hypertrophy. Systolic function was normal. The estimated ejection fraction was  in the range of 55% to 60%. Wall motion was normal; there were no regional wall motion abnormalities. Doppler parameters are consistent with abnormal left ventricular relaxation (grade 1 diastolic dysfunction). The E/e' ratio is >15, suggesting markedly elevated LV filling pressure. - Aortic valve: Sclerosis without stenosis. - Aorta: The aorta was mildly calcified. - Mitral valve: Calcified annulus. Trivial regurgitation. - Right atrium: Pacemaker leads were not visualized. The atrium was mildly dilated. - Atrial septum: There was increased thickness of the septum, consistent with lipomatous hypertrophy. - Tricuspid valve: Moderate regurgitation. - Pulmonary arteries: PA peak pressure: 41mm Hg (S). - Pericardium, extracardiac: There was no pericardial effusion.  Antibiotics:  Zosyn 04/21/11--->04/23/11  Vancomycin 04/21/11--->04/23/11  Rocephin 04/21/2011---> 04/21/2011, 04/23/11--->04/24/11  Cipro 04/25/11--->    Subjective  Mrs. Reagor feels well.  She has been incontinent of feces, but denies SOB, cough, pain.   Objective    Interim History: Stable overnight.   Objective: Filed Vitals:   04/26/11 1300 04/26/11 2115 04/26/11 2300 04/27/11 0530  BP: 185/70 186/72 148/49 156/68  Pulse: 61 62  57  Temp: 98.1 F (36.7 C) 98.7 F (37.1 C)  97.2 F (36.2 C)  TempSrc: Oral Oral  Oral  Resp: 20 19  20   Height:      Weight:   79.9 kg (176 lb 2.4 oz)   SpO2: 98% 100%  98%    Intake/Output Summary (Last 24 hours) at 04/27/11 0920 Last data filed at 04/27/11 0900  Gross per 24 hour  Intake   1080 ml  Output   2550 ml  Net  -1470 ml    Exam: Gen:  NAD Cardiovascular:  RRR, No M/R/G Respiratory: Lungs CTAB upper, diminished in the bases Gastrointestinal: Abdomen soft, NT/ND with normal active bowel sounds. Extremities: 1+ edema bilaterally. Mild bilateral erythema.  Very tiny ulcer right great toe with callous, and ventral foot ulcer with callous.  Data  Reviewed: Basic Metabolic Panel:  Lab 04/27/11 1610 04/26/11 0455 04/25/11 0542 04/24/11 0424 04/23/11 0505  NA 134* 136 131* 133* 131*  K 3.9 3.5 -- -- --  CL 94* 96 94* 98 94*  CO2 31 33* 30 26 28   GLUCOSE 250* 47* 266* 165* 294*  BUN 32* 33* 27* 25* 35*  CREATININE 1.46* 1.52* 1.44* 1.55* 1.97*  CALCIUM 8.5 8.7 8.7 8.4 8.2*  MG -- -- -- -- --  PHOS -- -- -- -- --   GFR Estimated Creatinine Clearance: 29 ml/min (by C-G formula based on Cr of 1.46). Liver Function Tests:  Lab 04/22/11 0506  AST 17  ALT 11  ALKPHOS 74  BILITOT 0.3  PROT 6.1  ALBUMIN 2.5*    CBC:  Lab 04/27/11 0450 04/26/11 0455 04/25/11 0542 04/24/11 0424 04/23/11 0505  WBC 11.7* 11.9* 10.5 14.5* 16.1*  NEUTROABS -- -- -- -- --  HGB 10.5* 9.8* 10.4* 10.0* 10.0*  HCT 31.3* 29.8* 31.2* 31.0* 30.9*  MCV 87.2 85.1 86.0 87.6 88.0  PLT 305 293 212 204 207   Cardiac Enzymes:  Lab 04/24/11 1106 04/21/11 1341  CKTOTAL 85 --  CKMB 2.3 --  CKMBINDEX -- --  TROPONINI <0.30 <0.30   Pro BNP:  Ref. Range 04/23/2011 05:05 04/24/2011 11:06 04/27/2011 04:50  Pro B Natriuretic peptide (BNP) Latest Range: 0-450 pg/mL 4651.0 (H) 8616.0 (H) 2796.0 (H)   CBG:  Lab 04/26/11 2111 04/26/11 1646 04/26/11 1129 04/26/11 0751 04/26/11 0718  GLUCAP 259* 234*  204* 88 42*     Ref. Range 04/21/2011 14:15  Lactic Acid, Venous Latest Range: 0.5-2.2 mmol/L 2.3 (H)   Microbiology Recent Results (from the past 240 hour(s))  URINE CULTURE     Status: Normal   Collection Time   04/21/11  5:52 PM      Component Value Range Status Comment   Specimen Description URINE, CATHETERIZED   Final    Special Requests NONE   Final    Culture  Setup Time [id number]   Final    Colony Count >=100,000 COLONIES/ML   Final    Culture ESCHERICHIA COLI   Final    Report Status 04/23/2011 FINAL   Final    Organism ID, Bacteria ESCHERICHIA COLI   Final   CULTURE, BLOOD (ROUTINE X 2)     Status: Normal (Preliminary result)   Collection Time    04/21/11 11:05 PM      Component Value Range Status Comment   Specimen Description BLOOD RIGHT HAND   Final    Special Requests BOTTLES DRAWN AEROBIC AND ANAEROBIC 3CC   Final    Culture  Setup Time [id number]   Final    Culture     Final    Value:        BLOOD CULTURE RECEIVED NO GROWTH TO DATE CULTURE WILL BE HELD FOR 5 DAYS BEFORE ISSUING A FINAL NEGATIVE REPORT   Report Status PENDING   Incomplete   CULTURE, BLOOD (ROUTINE X 2)     Status: Normal (Preliminary result)   Collection Time   04/21/11 11:05 PM      Component Value Range Status Comment   Specimen Description BLOOD L HAND   Final    Special Requests BOTTLES DRAWN AEROBIC AND ANAEROBIC 2CC   Final    Culture  Setup Time [id number]   Final    Culture     Final    Value:        BLOOD CULTURE RECEIVED NO GROWTH TO DATE CULTURE WILL BE HELD FOR 5 DAYS BEFORE ISSUING A FINAL NEGATIVE REPORT   Report Status PENDING   Incomplete   CLOSTRIDIUM DIFFICILE BY PCR     Status: Normal   Collection Time   04/23/11  7:17 AM      Component Value Range Status Comment   C difficile by pcr NEGATIVE  NEGATIVE  Final   MRSA PCR SCREENING     Status: Normal   Collection Time   04/24/11 11:52 AM      Component Value Range Status Comment   MRSA by PCR NEGATIVE  NEGATIVE  Final     Studies:  Dg Chest Port 1 View 04/21/2011  *RADIOLOGY REPORT*  Clinical Data: Weakness and shortness of breath.  PORTABLE CHEST - 1 VIEW  Comparison: Fourth 32,011.  Findings: The heart is mildly enlarged but stable.  There is tortuosity and calcification of the thoracic aorta.  There is moderate vascular congestion and possible mild interstitial edema. No definite pleural effusions or focal infiltrates.  The bony thorax is intact.  Remote AVN involving the right humeral head.  IMPRESSION: Cardiac enlargement with vascular congestion and probable mild interstitial edema.  Original Report Authenticated By: P. Loralie Champagne, M.D.    Dg Esophagus 04/22/2011  IMPRESSION:  Mild presbyesophagus.  Negative for stricture or focal abnormality.  Original Report Authenticated By: Bernadene Bell. D'ALESSIO, M.D.    Dg Chest Port 1 View 04/24/2011  IMPRESSION: Slight interval increase in degree of pulmonary edema and the  size of small bilateral effusions.  Original Report Authenticated By: Brandon Melnick, M.D.    Dg Chest 1 View 04/26/2011 IMPRESSION: No significant interval change.  Original Report Authenticated By: Cyndie Chime, M.D.    Scheduled Meds:    . calcium carbonate  1 tablet Oral Daily  . carvedilol  6.25 mg Oral BID WC  . ciprofloxacin  250 mg Oral BID  . diltiazem  180 mg Oral Daily  . enoxaparin  30 mg Subcutaneous Q24H  . feeding supplement  237 mL Oral QPC supper  . Flora-Q  1 capsule Oral Daily  . furosemide  20 mg Oral BID  . gabapentin  300 mg Oral Daily  . insulin aspart  0-15 Units Subcutaneous TID WC  . insulin aspart  0-5 Units Subcutaneous QHS  . insulin glargine  40 Units Subcutaneous QHS  . isosorbide mononitrate  30 mg Oral Daily  . levothyroxine  225 mcg Oral QAC breakfast  . mulitivitamin with minerals  1 tablet Oral Daily  . oxybutynin  10 mg Oral QHS  . pantoprazole (PROTONIX) IV  40 mg Intravenous Q24H  . Tamsulosin HCl  0.4 mg Oral Daily  . traZODone  50 mg Oral QHS  . vitamin B-12  1,000 mcg Oral Daily  . vitamin E  100 Units Oral Daily  . DISCONTD: famotidine (PEPCID) IV  20 mg Intravenous Q12H   Continuous Infusions:      LOS: 6 days   Hillery Aldo, MD Pager [phone]  04/27/2011, 9:20 AM

## 2011-04-28 LAB — CULTURE, BLOOD (ROUTINE X 2)
Culture  Setup Time: 201303280359
Culture: NO GROWTH

## 2011-07-23 ENCOUNTER — Encounter (HOSPITAL_BASED_OUTPATIENT_CLINIC_OR_DEPARTMENT_OTHER): Payer: Medicare Other | Attending: General Surgery

## 2011-07-23 DIAGNOSIS — L84 Corns and callosities: Secondary | ICD-10-CM | POA: Insufficient documentation

## 2011-07-23 DIAGNOSIS — E1069 Type 1 diabetes mellitus with other specified complication: Secondary | ICD-10-CM | POA: Insufficient documentation

## 2011-07-23 DIAGNOSIS — Z79899 Other long term (current) drug therapy: Secondary | ICD-10-CM | POA: Insufficient documentation

## 2011-07-23 DIAGNOSIS — L97509 Non-pressure chronic ulcer of other part of unspecified foot with unspecified severity: Secondary | ICD-10-CM | POA: Insufficient documentation

## 2011-07-23 DIAGNOSIS — E039 Hypothyroidism, unspecified: Secondary | ICD-10-CM | POA: Insufficient documentation

## 2011-07-23 DIAGNOSIS — I10 Essential (primary) hypertension: Secondary | ICD-10-CM | POA: Insufficient documentation

## 2011-07-23 NOTE — Progress Notes (Signed)
[facility]  NAME:  NAOKO, DIPERNA NO.:  [id number]  MEDICAL RECORD NO.:  [id number]      DATE OF BIRTH:  Feb 11, 1919  PHYSICIAN:  Ardath Sax, M.D.           VISIT DATE:                                  OFFICE VISIT   Mrs. Smolinski is a very alert, very pleasant 78 year old lady who has a 11- year history of having diabetes and mild hypertension.  She takes several medicines including Lasix 20 mg a day.  She also takes about 15 units of NovoLog about every 8 hours for control of her sugar.  She also takes levothyroxine for hypothyroidism, isosorbide for hypertension, and she takes trazodone for sleep, and other than that, she just takes vitamins.  She has been under the care of a podiatrist for a diabetic foot ulcer on her right first metatarsal area on the plantar aspect of her foot.  [facility] for treatment of this.  Her vital signs are normal.  Her temperature is normal.  She had a blood pressure of 150/90, and while she was here, I debrided the callus and it is about a cm in diameter, and 3 mm deep. Today, we put silver collagen in the ulcer and she has got a boot that offloads this, but not well enough I do not think.  When she comes back next week, we will plan on continuing the collagen and I would like to put a total contact cast on.  So her diagnoses is: 1. Diabetic foot ulcer. 2. Type 1 diabetes. 3. Hypertension. 4. Hypothyroidism.     Ardath Sax, M.D.     PP/MEDQ  D:  07/23/2011  T:  07/23/2011  Job:  [id number]

## 2011-07-26 ENCOUNTER — Ambulatory Visit (HOSPITAL_COMMUNITY)
Admission: RE | Admit: 2011-07-26 | Discharge: 2011-07-26 | Disposition: A | Discharge status: Home / Self Care | Payer: Medicare Other | Source: Ambulatory Visit | Attending: General Surgery | Admitting: General Surgery

## 2011-07-26 ENCOUNTER — Other Ambulatory Visit (HOSPITAL_BASED_OUTPATIENT_CLINIC_OR_DEPARTMENT_OTHER): Payer: Self-pay | Admitting: General Surgery

## 2011-07-26 DIAGNOSIS — Y849 Medical procedure, unspecified as the cause of abnormal reaction of the patient, or of later complication, without mention of misadventure at the time of the procedure: Secondary | ICD-10-CM | POA: Insufficient documentation

## 2011-07-26 DIAGNOSIS — T8189XA Other complications of procedures, not elsewhere classified, initial encounter: Secondary | ICD-10-CM | POA: Insufficient documentation

## 2011-07-30 ENCOUNTER — Encounter (HOSPITAL_BASED_OUTPATIENT_CLINIC_OR_DEPARTMENT_OTHER): Payer: Medicare Other | Attending: General Surgery

## 2011-07-30 DIAGNOSIS — L97509 Non-pressure chronic ulcer of other part of unspecified foot with unspecified severity: Secondary | ICD-10-CM | POA: Insufficient documentation

## 2011-07-30 DIAGNOSIS — Z794 Long term (current) use of insulin: Secondary | ICD-10-CM | POA: Insufficient documentation

## 2011-07-30 DIAGNOSIS — E039 Hypothyroidism, unspecified: Secondary | ICD-10-CM | POA: Insufficient documentation

## 2011-07-30 DIAGNOSIS — E1069 Type 1 diabetes mellitus with other specified complication: Secondary | ICD-10-CM | POA: Insufficient documentation

## 2011-07-30 DIAGNOSIS — Z79899 Other long term (current) drug therapy: Secondary | ICD-10-CM | POA: Insufficient documentation

## 2011-07-30 DIAGNOSIS — I10 Essential (primary) hypertension: Secondary | ICD-10-CM | POA: Insufficient documentation

## 2011-08-20 ENCOUNTER — Encounter (HOSPITAL_BASED_OUTPATIENT_CLINIC_OR_DEPARTMENT_OTHER): Payer: Medicare Other

## 2011-08-27 ENCOUNTER — Encounter (HOSPITAL_BASED_OUTPATIENT_CLINIC_OR_DEPARTMENT_OTHER): Payer: Medicare Other

## 2011-08-27 ENCOUNTER — Encounter (HOSPITAL_BASED_OUTPATIENT_CLINIC_OR_DEPARTMENT_OTHER): Payer: Medicare Other | Attending: General Surgery

## 2011-08-27 DIAGNOSIS — L97509 Non-pressure chronic ulcer of other part of unspecified foot with unspecified severity: Secondary | ICD-10-CM | POA: Insufficient documentation

## 2011-08-27 DIAGNOSIS — E1169 Type 2 diabetes mellitus with other specified complication: Secondary | ICD-10-CM | POA: Insufficient documentation

## 2011-08-30 ENCOUNTER — Encounter (HOSPITAL_BASED_OUTPATIENT_CLINIC_OR_DEPARTMENT_OTHER): Payer: Medicare Other

## 2011-10-01 ENCOUNTER — Encounter (HOSPITAL_BASED_OUTPATIENT_CLINIC_OR_DEPARTMENT_OTHER): Payer: Medicare Other | Attending: General Surgery

## 2011-10-01 DIAGNOSIS — L97509 Non-pressure chronic ulcer of other part of unspecified foot with unspecified severity: Secondary | ICD-10-CM | POA: Insufficient documentation

## 2011-10-01 DIAGNOSIS — E1169 Type 2 diabetes mellitus with other specified complication: Secondary | ICD-10-CM | POA: Insufficient documentation

## 2011-10-02 NOTE — Progress Notes (Signed)
[facility]  NAME:  [name], [name]               ACCOUNT NO.:  [id number]  MEDICAL RECORD NO.:  [id number]      DATE OF BIRTH:  12/19/1919  PHYSICIAN:  Ardath Sax, M.D.           VISIT DATE:                                  OFFICE VISIT   [name] is an amazingly alert and active 78 year old lady who has a diabetic foot ulcer on her right foot that we have gotten it from about 1.5 cm down to 4 mm in diameter by the use of 8 Dermagrafts. Because of the amazing a decrease in size and healing that has gone on with her diabetic foot ulcer, we would like to get permission from her insurance to continue with a Dermagrafts.  I really think if we could do this 4-5 more times we could get this ulcer healed.     Ardath Sax, M.D.     PP/MEDQ  D:  10/01/2011  T:  10/02/2011  Job:  [id number]

## 2011-11-12 ENCOUNTER — Encounter (HOSPITAL_BASED_OUTPATIENT_CLINIC_OR_DEPARTMENT_OTHER): Payer: Medicare Other | Attending: General Surgery

## 2011-11-12 DIAGNOSIS — E1169 Type 2 diabetes mellitus with other specified complication: Secondary | ICD-10-CM | POA: Insufficient documentation

## 2011-11-12 DIAGNOSIS — L97509 Non-pressure chronic ulcer of other part of unspecified foot with unspecified severity: Secondary | ICD-10-CM | POA: Insufficient documentation

## 2011-11-26 ENCOUNTER — Encounter (HOSPITAL_BASED_OUTPATIENT_CLINIC_OR_DEPARTMENT_OTHER): Payer: Medicare Other | Attending: General Surgery

## 2011-11-26 DIAGNOSIS — L97509 Non-pressure chronic ulcer of other part of unspecified foot with unspecified severity: Secondary | ICD-10-CM | POA: Insufficient documentation

## 2011-11-26 DIAGNOSIS — E1169 Type 2 diabetes mellitus with other specified complication: Secondary | ICD-10-CM | POA: Insufficient documentation

## 2011-12-10 ENCOUNTER — Encounter (HOSPITAL_BASED_OUTPATIENT_CLINIC_OR_DEPARTMENT_OTHER): Payer: Medicare Other

## 2011-12-24 ENCOUNTER — Inpatient Hospital Stay (HOSPITAL_COMMUNITY)
Admission: EM | Admit: 2011-12-24 | Discharge: 2011-12-27 | DRG: 690 | Disposition: A | Discharge status: Skilled Nursing Facility | Payer: Medicare Other | Attending: Internal Medicine | Admitting: Internal Medicine

## 2011-12-24 ENCOUNTER — Emergency Department (HOSPITAL_COMMUNITY): Payer: Medicare Other

## 2011-12-24 ENCOUNTER — Encounter (HOSPITAL_COMMUNITY): Payer: Self-pay | Admitting: Emergency Medicine

## 2011-12-24 DIAGNOSIS — R5381 Other malaise: Secondary | ICD-10-CM | POA: Diagnosis present

## 2011-12-24 DIAGNOSIS — E1065 Type 1 diabetes mellitus with hyperglycemia: Secondary | ICD-10-CM

## 2011-12-24 DIAGNOSIS — I10 Essential (primary) hypertension: Secondary | ICD-10-CM | POA: Diagnosis present

## 2011-12-24 DIAGNOSIS — I5032 Chronic diastolic (congestive) heart failure: Secondary | ICD-10-CM | POA: Diagnosis present

## 2011-12-24 DIAGNOSIS — I252 Old myocardial infarction: Secondary | ICD-10-CM

## 2011-12-24 DIAGNOSIS — N058 Unspecified nephritic syndrome with other morphologic changes: Secondary | ICD-10-CM

## 2011-12-24 DIAGNOSIS — I509 Heart failure, unspecified: Secondary | ICD-10-CM | POA: Diagnosis present

## 2011-12-24 DIAGNOSIS — I2510 Atherosclerotic heart disease of native coronary artery without angina pectoris: Secondary | ICD-10-CM

## 2011-12-24 DIAGNOSIS — Z794 Long term (current) use of insulin: Secondary | ICD-10-CM

## 2011-12-24 DIAGNOSIS — E1029 Type 1 diabetes mellitus with other diabetic kidney complication: Secondary | ICD-10-CM

## 2011-12-24 DIAGNOSIS — R627 Adult failure to thrive: Secondary | ICD-10-CM | POA: Diagnosis present

## 2011-12-24 DIAGNOSIS — N179 Acute kidney failure, unspecified: Secondary | ICD-10-CM | POA: Diagnosis present

## 2011-12-24 DIAGNOSIS — E860 Dehydration: Secondary | ICD-10-CM

## 2011-12-24 DIAGNOSIS — E1142 Type 2 diabetes mellitus with diabetic polyneuropathy: Secondary | ICD-10-CM | POA: Diagnosis present

## 2011-12-24 DIAGNOSIS — IMO0001 Reserved for inherently not codable concepts without codable children: Secondary | ICD-10-CM | POA: Diagnosis present

## 2011-12-24 DIAGNOSIS — N189 Chronic kidney disease, unspecified: Secondary | ICD-10-CM

## 2011-12-24 DIAGNOSIS — I739 Peripheral vascular disease, unspecified: Secondary | ICD-10-CM | POA: Diagnosis present

## 2011-12-24 DIAGNOSIS — Z79899 Other long term (current) drug therapy: Secondary | ICD-10-CM

## 2011-12-24 DIAGNOSIS — I129 Hypertensive chronic kidney disease with stage 1 through stage 4 chronic kidney disease, or unspecified chronic kidney disease: Secondary | ICD-10-CM | POA: Diagnosis present

## 2011-12-24 DIAGNOSIS — N390 Urinary tract infection, site not specified: Principal | ICD-10-CM | POA: Diagnosis present

## 2011-12-24 DIAGNOSIS — K5900 Constipation, unspecified: Secondary | ICD-10-CM | POA: Diagnosis present

## 2011-12-24 DIAGNOSIS — E039 Hypothyroidism, unspecified: Secondary | ICD-10-CM | POA: Diagnosis present

## 2011-12-24 DIAGNOSIS — E871 Hypo-osmolality and hyponatremia: Secondary | ICD-10-CM | POA: Diagnosis present

## 2011-12-24 DIAGNOSIS — E1149 Type 2 diabetes mellitus with other diabetic neurological complication: Secondary | ICD-10-CM | POA: Diagnosis present

## 2011-12-24 LAB — BASIC METABOLIC PANEL
Calcium: 9.5 mg/dL (ref 8.4–10.5)
Creatinine, Ser: 2.81 mg/dL — ABNORMAL HIGH (ref 0.50–1.10)
GFR calc Af Amer: 16 mL/min — ABNORMAL LOW (ref >90)
GFR calc non Af Amer: 14 mL/min — ABNORMAL LOW (ref >90)

## 2011-12-24 LAB — GLUCOSE, CAPILLARY
Glucose-Capillary: 240 mg/dL — ABNORMAL HIGH (ref 70–99)
Glucose-Capillary: 219 mg/dL — ABNORMAL HIGH (ref 70–99)

## 2011-12-24 LAB — URINALYSIS, ROUTINE W REFLEX MICROSCOPIC
Bilirubin Urine: NEGATIVE
Ketones, ur: NEGATIVE mg/dL
Nitrite: NEGATIVE
Protein, ur: 30 mg/dL — AB

## 2011-12-24 LAB — CBC
HCT: 35.2 % — ABNORMAL LOW (ref 36.0–46.0)
Hemoglobin: 11.9 g/dL — ABNORMAL LOW (ref 12.0–15.0)
Platelets: 303 10*3/uL (ref 150–400)
RBC: 4.11 MIL/uL (ref 3.87–5.11)
RDW: 13.5 % (ref 11.5–15.5)

## 2011-12-24 LAB — MAGNESIUM: Magnesium: 2.3 mg/dL (ref 1.5–2.5)

## 2011-12-24 LAB — HEPATIC FUNCTION PANEL
Bilirubin, Direct: <0.1 mg/dL (ref 0.0–0.3)
Total Bilirubin: 0.2 mg/dL — ABNORMAL LOW (ref 0.3–1.2)

## 2011-12-24 LAB — URINE MICROSCOPIC-ADD ON

## 2011-12-24 MED ORDER — ISOSORBIDE MONONITRATE ER 30 MG PO TB24
30.0000 mg | ORAL_TABLET | Freq: Every day | ORAL | Status: DC
  Administered 2011-12-25 – 2011-12-27 (×3): 30 mg via ORAL
  Filled 2011-12-24 (×4): qty 1

## 2011-12-24 MED ORDER — ADULT MULTIVITAMIN W/MINERALS CH
1.0000 | ORAL_TABLET | Freq: Every day | ORAL | Status: DC
  Administered 2011-12-25 – 2011-12-27 (×3): 1 via ORAL
  Filled 2011-12-24 (×5): qty 1

## 2011-12-24 MED ORDER — VITAMIN B-12 1000 MCG PO TABS
1000.0000 ug | ORAL_TABLET | Freq: Every day | ORAL | Status: DC
  Administered 2011-12-25 – 2011-12-27 (×3): 1000 ug via ORAL
  Filled 2011-12-24 (×4): qty 1

## 2011-12-24 MED ORDER — CARVEDILOL 6.25 MG PO TABS
6.2500 mg | ORAL_TABLET | Freq: Two times a day (BID) | ORAL | Status: DC
  Administered 2011-12-24 – 2011-12-27 (×6): 6.25 mg via ORAL
  Filled 2011-12-24 (×9): qty 1

## 2011-12-24 MED ORDER — PAREGORIC 2 MG/5ML PO TINC: 10.0000 mL | Freq: Four times a day (QID) | ORAL | Status: DC | PRN

## 2011-12-24 MED ORDER — INSULIN GLARGINE 100 UNIT/ML ~~LOC~~ SOLN
15.0000 [IU] | Freq: Every day | SUBCUTANEOUS | Status: DC
  Administered 2011-12-24 – 2011-12-26 (×3): 15 [IU] via SUBCUTANEOUS

## 2011-12-24 MED ORDER — DILTIAZEM HCL ER COATED BEADS 180 MG PO CP24
180.0000 mg | ORAL_CAPSULE | Freq: Every day | ORAL | Status: DC
  Administered 2011-12-25 – 2011-12-27 (×3): 180 mg via ORAL
  Filled 2011-12-24 (×4): qty 1

## 2011-12-24 MED ORDER — PANTOPRAZOLE SODIUM 40 MG PO TBEC: 40.0000 mg | DELAYED_RELEASE_TABLET | Freq: Every day | ORAL | Status: DC

## 2011-12-24 MED ORDER — ONDANSETRON HCL 4 MG PO TABS: 4.0000 mg | ORAL_TABLET | Freq: Four times a day (QID) | ORAL | Status: DC | PRN

## 2011-12-24 MED ORDER — GABAPENTIN 300 MG PO CAPS
300.0000 mg | ORAL_CAPSULE | Freq: Two times a day (BID) | ORAL | Status: DC
  Administered 2011-12-24 – 2011-12-27 (×6): 300 mg via ORAL
  Filled 2011-12-24 (×7): qty 1

## 2011-12-24 MED ORDER — PANTOPRAZOLE SODIUM 40 MG IV SOLR
40.0000 mg | Freq: Every day | INTRAVENOUS | Status: DC
  Administered 2011-12-24 – 2011-12-25 (×2): 40 mg via INTRAVENOUS
  Filled 2011-12-24 (×3): qty 40

## 2011-12-24 MED ORDER — SODIUM CHLORIDE 0.9 % IV SOLN
INTRAVENOUS | Status: DC
  Administered 2011-12-24 – 2011-12-25 (×3): via INTRAVENOUS
  Administered 2011-12-26: 1000 mL via INTRAVENOUS
  Administered 2011-12-26: 01:00:00 via INTRAVENOUS

## 2011-12-24 MED ORDER — HEPARIN SODIUM (PORCINE) 5000 UNIT/ML IJ SOLN
5000.0000 [IU] | Freq: Three times a day (TID) | INTRAMUSCULAR | Status: DC
  Administered 2011-12-24 – 2011-12-27 (×8): 5000 [IU] via SUBCUTANEOUS
  Filled 2011-12-24 (×12): qty 1

## 2011-12-24 MED ORDER — INSULIN ASPART 100 UNIT/ML ~~LOC~~ SOLN
0.0000 [IU] | Freq: Three times a day (TID) | SUBCUTANEOUS | Status: DC
  Administered 2011-12-24 – 2011-12-25 (×2): 3 [IU] via SUBCUTANEOUS
  Administered 2011-12-25: 9 [IU] via SUBCUTANEOUS
  Administered 2011-12-25: 5 [IU] via SUBCUTANEOUS
  Administered 2011-12-26: 2 [IU] via SUBCUTANEOUS

## 2011-12-24 MED ORDER — ACETAMINOPHEN 650 MG RE SUPP: 650.0000 mg | Freq: Four times a day (QID) | RECTAL | Status: DC | PRN

## 2011-12-24 MED ORDER — VITAMIN E 45 MG (100 UNIT) PO CAPS
100.0000 [IU] | ORAL_CAPSULE | Freq: Every day | ORAL | Status: DC
  Administered 2011-12-25 – 2011-12-27 (×3): 100 [IU] via ORAL
  Filled 2011-12-24 (×4): qty 1

## 2011-12-24 MED ORDER — LEVOTHYROXINE SODIUM 25 MCG PO TABS
225.0000 ug | ORAL_TABLET | Freq: Every day | ORAL | Status: DC
  Filled 2011-12-24 (×3): qty 1

## 2011-12-24 MED ORDER — CIPROFLOXACIN IN D5W 400 MG/200ML IV SOLN
400.0000 mg | Freq: Two times a day (BID) | INTRAVENOUS | Status: DC
  Administered 2011-12-24 – 2011-12-27 (×6): 400 mg via INTRAVENOUS
  Filled 2011-12-24 (×7): qty 200

## 2011-12-24 MED ORDER — ACETAMINOPHEN 325 MG PO TABS
650.0000 mg | ORAL_TABLET | Freq: Four times a day (QID) | ORAL | Status: DC | PRN
  Administered 2011-12-26: 650 mg via ORAL
  Filled 2011-12-24: qty 2

## 2011-12-24 MED ORDER — ONDANSETRON HCL 4 MG/2ML IJ SOLN
4.0000 mg | Freq: Four times a day (QID) | INTRAMUSCULAR | Status: DC | PRN
  Administered 2011-12-24: 4 mg via INTRAVENOUS
  Filled 2011-12-24: qty 2

## 2011-12-24 MED ORDER — TRAZODONE HCL 50 MG PO TABS
50.0000 mg | ORAL_TABLET | Freq: Every day | ORAL | Status: DC
  Administered 2011-12-24 – 2011-12-26 (×3): 50 mg via ORAL
  Filled 2011-12-24 (×4): qty 1

## 2011-12-24 NOTE — Progress Notes (Signed)
CARE MANAGEMENT ED NOTE 12/24/2011  Patient:  [name], [name]   Account Number:  [id number]  Date Initiated:  12/24/2011  Documentation initiated by:  Edd Arbour  Subjective/Objective Assessment:   78 yr old female medicare patient from home - lives alone with a caregiver, sharon,  per pt coming in from 9am to 1 pm qd and a Engineer, water, [employer], from [employer]     Subjective/Objective Assessment Detail:   pt reports Rubin Payor number is [phone] who calls her qd at 4 pm Pt noted by Cm with some confusion with time     Action/Plan:   cm did not offered home health list to pt at this time (confusion) Giving pt home health list pending mental status Cm discussed chs therapy evaluation pending for recommendations for possible home services   Action/Plan Detail:   Anticipated DC Date:  12/27/2011     Status Recommendation to Physician:   Result of Recommendation:    Other ED Services  Consult Working Plan    DC Planning Services  Other    Choice offered to / List presented to:            Status of service:  Completed, signed off  ED Comments:   ED Comments Detail:

## 2011-12-24 NOTE — ED Notes (Signed)
RN to obtain labs with start of IV 

## 2011-12-24 NOTE — ED Notes (Signed)
[id number]<AV> Expected date:<BR> Expected time:<BR> Means of arrival:<BR> Comments:<BR> 89yoF fall

## 2011-12-24 NOTE — H&P (Addendum)
[facility] History and Physical  DEONE OMAHONEY WUJ:[id number] DOB: 23-Apr-1919 DOA: 12/24/2011  Referring physician: Dr. Lynelle Doctor.  PCP: [name], MD    Chief Complaint: Generalized Weakness.   HPI: [name] is a 78 y.o. female with PMH significant for Diabetes, hypothyroidism, Diastolic Hearth failure, Chronic renal failure Cr baseline at 1.4 who presents to ED complaining of generalize weakness. Patient relates that she was trying to go to the bathroom when she fell and hit her elbow. Doesn't recall lost of consciousness. She relates she had dysuria. She denies pain left elbow. She has chronic diarrhea for which she takes medications. She feels weak and tire. She denies chest pain or dyspnea.    Review of Systems: The patient denies anorexia, fever, weight loss,, vision loss, decreased hearing, hoarseness, chest pain, syncope, dyspnea on exertion, peripheral edema, balance deficits, hemoptysis, abdominal pain, melena, hematochezia, severe indigestion/heartburn, hematuria, incontinence, genital sores, , suspicious skin lesions, transient blindness,  depression, unusual weight change, abnormal bleeding, enlarged lymph nodes, angioedema, and breast masses.    Past Medical History  Diagnosis Date  . Myocardial infarction   . Coronary artery disease   . Hypertension   . Dysrhythmia   . Peripheral vascular disease   . Hypothyroidism   . Diabetes mellitus   . Presbyesophagus 04/23/2011  . Urinary retention with incomplete bladder emptying 04/23/2011  . UTI (lower urinary tract infection) 04/21/2011  . Diastolic CHF, chronic     Grade I diastolic dysfunction on Echo 04/26/11  . Anaphylaxis 04/25/2011    Thought to be from Rocephin   Past Surgical History  Procedure Date  . Cholecystectomy   . Colonoscopy 01/15/2011    Procedure: COLONOSCOPY;  Surgeon: Theda Belfast, MD;  Location: [facility];  Service: Endoscopy;  Laterality: N/A;   Social History:  reports that she quit  smoking about 30 years ago. Her smoking use included Cigarettes. She has never used smokeless tobacco. She reports that she drinks alcohol. She reports that she does not use illicit drugs. Patient live at home, has caregiver for 4 hours in the morning.    Allergies  Allergen Reactions  . Ephedrine   . Morphine Other (See Comments)    Difficulty waking up & talking after surgery  . Rocephin (Ceftriaxone Sodium In Dextrose)     Developed acute resp distress, wheezing after 2nd dose.   Family history: Parents died, she doesn't remember cause.   Prior to Admission medications   Medication Sig Start Date End Date Taking? Authorizing Provider  calcium carbonate (OS-CAL - DOSED IN MG OF ELEMENTAL CALCIUM) 1250 MG tablet Take 1 tablet by mouth daily.   Yes Historical Provider, MD  carvedilol (COREG) 6.25 MG tablet Take 6.25 mg by mouth 2 (two) times daily with a meal.     Yes Historical Provider, MD  diltiazem (CARDIZEM CD) 180 MG 24 hr capsule Take 180 mg by mouth daily.   Yes Historical Provider, MD  furosemide (LASIX) 20 MG tablet Take 20 mg by mouth 2 (two) times daily.     Yes Historical Provider, MD  gabapentin (NEURONTIN) 300 MG capsule Take 300 mg by mouth 2 (two) times daily.    Yes Historical Provider, MD  ibuprofen (ADVIL,MOTRIN) 200 MG tablet Take 200 mg by mouth every 6 (six) hours as needed. pain   Yes Historical Provider, MD  insulin glargine (LANTUS) 100 UNIT/ML injection Inject 30-42 Units into the skin at bedtime. 04/27/11  Yes Maryruth Bun Rama, MD  insulin lispro (  HUMALOG) 100 UNIT/ML injection Inject 12 Units into the skin 3 (three) times daily as needed. Depends on blood sugar   Yes Historical Provider, MD  isosorbide mononitrate (IMDUR) 30 MG 24 hr tablet Take 30 mg by mouth daily.     Yes Historical Provider, MD  levothyroxine (SYNTHROID, LEVOTHROID) 75 MCG tablet Take 3 tablets (225 mcg total) by mouth daily before breakfast. 04/27/11 04/26/12 Yes Maryruth Bun Rama, MD  Multiple  Vitamin (MULITIVITAMIN WITH MINERALS) TABS Take 1 tablet by mouth daily.   Yes Historical Provider, MD  Tamsulosin HCl (FLOMAX) 0.4 MG CAPS Take 0.4 mg by mouth daily.     Yes Historical Provider, MD  traZODone (DESYREL) 50 MG tablet Take 50 mg by mouth at bedtime.     Yes Historical Provider, MD  vitamin B-12 (CYANOCOBALAMIN) 1000 MCG tablet Take 1,000 mcg by mouth daily.   Yes Historical Provider, MD  vitamin E 100 UNIT capsule Take 100 Units by mouth daily.   Yes Historical Provider, MD   Physical Exam: Filed Vitals:   12/24/11 1314  BP: 150/53  Pulse: 71  Temp: 97.8 F (36.6 C)  TempSrc: Oral  Resp: 16  SpO2: 93%     General: elderly lady in no acute distress. Ill appearing.   Eyes: PERLA, an icteric.   ENT: No tonsillar enlargement, dry tongue and lips.   Neck: Supple, No JVD.   Cardiovascular:   Respiratory: respiration unlabored, No Wheezes, no crackles.  Abdomen: Bs present, soft, no rigidity, no tender.   Skin: Skin tear left elbow.   Musculoskeletal: no deformity.  Neurologic: Non Focal.  Labs on Admission:  Basic Metabolic Panel:  Lab 12/24/11 1610  NA 127*  K 5.1  CL 87*  CO2 28  GLUCOSE 249*  BUN 54*  CREATININE 2.81*  CALCIUM 9.5  MG --  PHOS --   Liver Function Tests:  Lab 12/24/11 1352  AST 25  ALT 18  ALKPHOS 86  BILITOT 0.2*  PROT 7.5  ALBUMIN 3.3*   CBC:  Lab 12/24/11 1352  WBC 10.3  NEUTROABS --  HGB 11.9*  HCT 35.2*  MCV 85.6  PLT 303   Cardiac Enzymes:  Lab 12/24/11 1352  CKTOTAL --  CKMB --  CKMBINDEX --  TROPONINI <0.30    BNP (last 3 results)  Basename 04/27/11 0450 04/24/11 1106 04/23/11 0505  PROBNP 2796.0* 8616.0* 4651.0*   CBG:  Lab 12/24/11 1400  GLUCAP 240*    Radiological Exams on Admission: Dg Chest 2 View  12/24/2011  *RADIOLOGY REPORT*  Clinical Data: Altered mental status and shortness of breath  CHEST - 2 VIEW  Comparison: Multiple prior chest radiographs, most recent 04/26/2011.   Lumbar spine radiographs 01/13/2010.  Findings: Stable borderline/mild cardiomegaly. There is mild pulmonary vascular congestion.  There is bronchial wall thickening. Small focal opacity at the left lung base could reflect atelectasis or a small focus of airspace disease.  No definite pulmonary edema. Negative for pleural effusion or pneumothorax.  There is a remote healed fracture deformity of the proximal right humerus, stable.  There are remote healed left-sided rib fractures. Remote fracture remote compression fractures T9, T11, and T12 are stable.  No acute thoracic spine fracture is identified.  There is atherosclerotic calcification of the abdominal aorta.  IMPRESSION:  1.  Borderline cardiomegaly with pulmonary vascular congestion. 2.  Small focal opacity the left lung base could reflect atelectasis or small focus of airspace disease. 3.  Stable chronic compression fractures of T9, T11,  and T12.   Original Report Authenticated By: Britta Mccreedy, M.D.     EKG: Sinus Rhythm.  Assessment/Plan Active Problems:  DIABETES MELLITUS, TYPE II, UNCONTROLLED  UTI (lower urinary tract infection)  Hypothyroidism  Acute renal injury  Hyponatremia  1. UTI: Patient presents with FTT, UA with 21 to 50 WBC. I will continue with Ciprofloxacin. Urine culture ordered.  2. FTT: Treat for infection, correct electrolytes abnormalities. Check TSH.  3. Acute on Chronic renal failure: Patient cr baseline 1.4. Worsening renal function likely pre-renal, secondary to decrease volume, secondary to infection. I will start IV fluids. Strict I and O. Will consider renal US if no improvement.  4. Generalize weakness/ Fall: multifactorial, secondary to dehydration, infection.  5. Hyponatremia: In the setting of decrease volume, renal failure. Continue with IV fluids.   6. Chest X ray with atelectasis, vs air space diseases: Likely atelectasis.  7. Hypothyroidism: Continue with Synthroid. Check TSH. 8. Diabetes: SSI. Continue  with Lantus, low dose.    Code Status: Unable to contact Daughter. Will need to be address.  Family Communication: No family at bedside, try to contact family without luck.  Disposition Plan: Admit for IV fluids, and IV antibiotics.   Time spent: more than 60 minutes.   Retha Bither [facility] Pager [phone]  If 7PM-7AM, please contact night-coverage [email or web address] Password [email or web address] 12/24/2011, 4:02 PM

## 2011-12-24 NOTE — ED Notes (Signed)
Pt had curd like discharge from in/out tube at end of urinary catheterization

## 2011-12-24 NOTE — ED Provider Notes (Signed)
History    CSN: [id number] Arrival date & time 12/24/11  1307 First MD Initiated Contact with Patient 12/24/11 1310     Chief Complaint  Patient presents with  . Weakness   HPI Patient presents to the emergency room with complaints of general weakness. She lives at home alone but has someone that comes and checks on her every day. The caregiver apparently found her today to be weaker and less active than usual. Patient states her caregiver had to wake her up when normally she is already up and awaken out of bed. She feels fatigued and weak all over. She denies any focal deficits. She may have fallen out of bed this morning she has a slight skin tear to her left elbow. The patient has history of chronic back pain she states from an old surgery but denies any new back pain today.  She denies any trouble with nausea vomiting or diarrhea. She denies any chest pain or abdominal pain. She denies any headache.  Past Medical History  Diagnosis Date  . Myocardial infarction   . Coronary artery disease   . Hypertension   . Dysrhythmia   . Peripheral vascular disease   . Hypothyroidism   . Diabetes mellitus   . Presbyesophagus 04/23/2011  . Urinary retention with incomplete bladder emptying 04/23/2011  . UTI (lower urinary tract infection) 04/21/2011  . Diastolic CHF, chronic     Grade I diastolic dysfunction on Echo 04/26/11  . Anaphylaxis 04/25/2011    Thought to be from Rocephin    Past Surgical History  Procedure Date  . Cholecystectomy   . Colonoscopy 01/15/2011    Procedure: COLONOSCOPY;  Surgeon: Theda Belfast, MD;  Location: [facility];  Service: Endoscopy;  Laterality: N/A;    No family history on file.  History  Substance Use Topics  . Smoking status: Former Smoker    Types: Cigarettes    Quit date: 04/20/1981  . Smokeless tobacco: Never Used  . Alcohol Use: Yes     Comment: social    OB History    Grav Para Term Preterm Abortions TAB SAB Ect Mult Living                  Review of Systems  All other systems reviewed and are negative.    Allergies  Ephedrine; Morphine; and Rocephin  Home Medications   Current Outpatient Rx  Name  Route  Sig  Dispense  Refill  . ACETAMINOPHEN 500 MG PO TABS   Oral   Take 500 mg by mouth every 6 (six) hours as needed.         . BENTONITE POWD   Does not apply   1 capsule by Does not apply route as needed.         Marland Kitchen CALCIUM CARBONATE 1250 MG PO TABS   Oral   Take 1 tablet by mouth daily.         Marland Kitchen CARVEDILOL 6.25 MG PO TABS   Oral   Take 6.25 mg by mouth 2 (two) times daily with a meal.           . DILTIAZEM HCL ER COATED BEADS 180 MG PO CP24   Oral   Take 180 mg by mouth daily.         Marland Kitchen FLORA-Q PO CAPS   Oral   Take 1 capsule by mouth daily.         . FUROSEMIDE 20 MG PO TABS   Oral  Take 20 mg by mouth 2 (two) times daily.           Marland Kitchen GABAPENTIN 300 MG PO CAPS   Oral   Take 300 mg by mouth daily.           . INSULIN ASPART 100 UNIT/ML [place] SOLN   Subcutaneous   Inject 0-15 Units into the skin 3 (three) times daily with meals.   1 vial      . INSULIN ASPART 100 UNIT/ML [place] SOLN   Subcutaneous   Inject 0-5 Units into the skin at bedtime.   1 vial      . INSULIN GLARGINE 100 UNIT/ML [place] SOLN   Subcutaneous   Inject 40 Units into the skin at bedtime.   10 mL      . ISOSORBIDE MONONITRATE ER 30 MG PO TB24   Oral   Take 30 mg by mouth daily.           Marland Kitchen LEVOTHYROXINE SODIUM 75 MCG PO TABS   Oral   Take 3 tablets (225 mcg total) by mouth daily before breakfast.         . ADULT MULTIVITAMIN W/MINERALS CH   Oral   Take 1 tablet by mouth daily.         . OXYBUTYNIN CHLORIDE 5 MG PO TABS   Oral   Take 10 mg by mouth at bedtime.         Marland Kitchen PAREGORIC 2 MG/5ML PO TINC   Oral   Take 10 mLs by mouth 4 (four) times daily as needed. For bowel movement         . TAMSULOSIN HCL 0.4 MG PO CAPS   Oral   Take 0.4 mg by mouth daily.           . TRAZODONE HCL  50 MG PO TABS   Oral   Take 50 mg by mouth at bedtime.           Marland Kitchen VITAMIN B-12 1000 MCG PO TABS   Oral   Take 1,000 mcg by mouth daily.         Marland Kitchen VITAMIN E 100 UNITS PO CAPS   Oral   Take 100 Units by mouth daily.           BP 150/53  Pulse 71  Temp 97.8 F (36.6 C) (Oral)  Resp 16  SpO2 93%  Physical Exam  Nursing note and vitals reviewed. Constitutional: No distress.  HENT:  Head: Normocephalic and atraumatic.  Right Ear: External ear normal.  Left Ear: External ear normal.  Mouth/Throat: Mucous membranes are dry. No posterior oropharyngeal erythema.  Eyes: Conjunctivae normal are normal. Right eye exhibits no discharge. Left eye exhibits no discharge. No scleral icterus.  Neck: Neck supple. No tracheal deviation present.  Cardiovascular: Normal rate, regular rhythm and intact distal pulses.   Pulmonary/Chest: Effort normal and breath sounds normal. No stridor. No respiratory distress. She has no wheezes. She has no rales.  Abdominal: Soft. Bowel sounds are normal. She exhibits no distension. There is no tenderness. There is no rebound and no guarding.  Musculoskeletal: She exhibits no edema and no tenderness.       Cervical back: She exhibits no tenderness, no swelling and no edema.       Thoracic back: She exhibits no tenderness, no swelling and no edema.       Lumbar back: She exhibits no tenderness, no swelling and no edema.       Small  skin tear left elbow, full ROM, no ttp; able to sit up in bed, well healed scar lumbar  Neurological: She is alert. She has normal strength. No sensory deficit. Cranial nerve deficit:  no gross defecits noted. She exhibits normal muscle tone. She displays no seizure activity. Coordination normal.       Barely able to lift both hands off the bed, unable to move both legs off the bed but able to move both legs equally, no facial drooping noted,  Skin: Skin is warm and dry. No rash noted.  Psychiatric: She has a normal mood and  affect.    ED Course  Procedures (including critical care time)  Rate: 70  Rhythm: normal sinus rhythm  QRS Axis: normal  Intervals: normal  ST/T Wave abnormalities: normal  Conduction Disutrbances:none  Narrative Interpretation:   Old EKG Reviewed: none available  Labs Reviewed  CBC - Abnormal; Notable for the following:    Hemoglobin 11.9 (*)     HCT 35.2 (*)     All other components within normal limits  BASIC METABOLIC PANEL - Abnormal; Notable for the following:    Sodium 127 (*)     Chloride 87 (*)     Glucose, Bld 249 (*)     BUN 54 (*)     Creatinine, Ser 2.81 (*)     GFR calc non Af Amer 14 (*)     GFR calc Af Amer 16 (*)     All other components within normal limits  URINALYSIS, ROUTINE W REFLEX MICROSCOPIC - Abnormal; Notable for the following:    APPearance CLOUDY (*)     Glucose, UA 100 (*)     Hgb urine dipstick SMALL (*)     Protein, ur 30 (*)     Leukocytes, UA LARGE (*)     All other components within normal limits  GLUCOSE, CAPILLARY - Abnormal; Notable for the following:    Glucose-Capillary 240 (*)     All other components within normal limits  URINE MICROSCOPIC-ADD ON - Abnormal; Notable for the following:    Bacteria, UA FEW (*)     All other components within normal limits  TROPONIN I  HEPATIC FUNCTION PANEL  URINE CULTURE   Dg Chest 2 View  12/24/2011  *RADIOLOGY REPORT*  Clinical Data: Altered mental status and shortness of breath  CHEST - 2 VIEW  Comparison: Multiple prior chest radiographs, most recent 04/26/2011.  Lumbar spine radiographs 01/13/2010.  Findings: Stable borderline/mild cardiomegaly. There is mild pulmonary vascular congestion.  There is bronchial wall thickening. Small focal opacity at the left lung base could reflect atelectasis or a small focus of airspace disease.  No definite pulmonary edema. Negative for pleural effusion or pneumothorax.  There is a remote healed fracture deformity of the proximal right humerus, stable.   There are remote healed left-sided rib fractures. Remote fracture remote compression fractures T9, T11, and T12 are stable.  No acute thoracic spine fracture is identified.  There is atherosclerotic calcification of the abdominal aorta.  IMPRESSION:  1.  Borderline cardiomegaly with pulmonary vascular congestion. 2.  Small focal opacity the left lung base could reflect atelectasis or small focus of airspace disease. 3.  Stable chronic compression fractures of T9, T11, and T12.   Original Report Authenticated By: Britta Mccreedy, M.D.      1. UTI (lower urinary tract infection)   2. Dehydration   3. Hyponatremia       MDM  The patient appears to have  urinary tract infection associated with dehydration and acute on chronic renal failure. I suspect this is the cause of her generalized weakness and lethargy. Currently she remains hemodynamically stable and is afebrile. At this time I have low suspicion for sepsis. She'll be started on IV antibiotics and IV fluids. I will consult the hospitalist service regarding admission.        Celene Kras, MD 12/24/11 [phone]

## 2011-12-24 NOTE — ED Notes (Signed)
Per EMS: Pt fell out of bed this morning.  Denies LOC.  Denies hitting head.  Has 1 inch skin tear to left elbow.  Pt has no other complaints other than being weak.  [name] A&O x 4 but caregiver states that she is usually more active/alert than she is now.

## 2011-12-25 ENCOUNTER — Inpatient Hospital Stay (HOSPITAL_COMMUNITY): Payer: Medicare Other

## 2011-12-25 DIAGNOSIS — K5900 Constipation, unspecified: Secondary | ICD-10-CM | POA: Diagnosis present

## 2011-12-25 DIAGNOSIS — I2510 Atherosclerotic heart disease of native coronary artery without angina pectoris: Secondary | ICD-10-CM

## 2011-12-25 LAB — CBC
Platelets: 296 10*3/uL (ref 150–400)
RBC: 4.11 MIL/uL (ref 3.87–5.11)
RDW: 13.4 % (ref 11.5–15.5)
WBC: 9 10*3/uL (ref 4.0–10.5)

## 2011-12-25 LAB — BASIC METABOLIC PANEL
Calcium: 8.9 mg/dL (ref 8.4–10.5)
Creatinine, Ser: 1.92 mg/dL — ABNORMAL HIGH (ref 0.50–1.10)
GFR calc Af Amer: 25 mL/min — ABNORMAL LOW (ref >90)
Sodium: 127 mEq/L — ABNORMAL LOW (ref 135–145)

## 2011-12-25 LAB — GLUCOSE, CAPILLARY
Glucose-Capillary: 293 mg/dL — ABNORMAL HIGH (ref 70–99)
Glucose-Capillary: 235 mg/dL — ABNORMAL HIGH (ref 70–99)

## 2011-12-25 LAB — TSH: TSH: 0.548 u[IU]/mL (ref 0.350–4.500)

## 2011-12-25 MED ORDER — POLYETHYLENE GLYCOL 3350 17 G PO PACK
17.0000 g | PACK | Freq: Two times a day (BID) | ORAL | Status: DC
  Filled 2011-12-25 (×7): qty 1

## 2011-12-25 MED ORDER — BISACODYL 10 MG RE SUPP: 10.0000 mg | Freq: Every day | RECTAL | Status: DC

## 2011-12-25 MED ORDER — DOCUSATE SODIUM 100 MG PO CAPS
100.0000 mg | ORAL_CAPSULE | Freq: Two times a day (BID) | ORAL | Status: DC
  Administered 2011-12-25 – 2011-12-27 (×2): 100 mg via ORAL
  Filled 2011-12-25 (×6): qty 1

## 2011-12-25 MED ORDER — LEVOTHYROXINE SODIUM 25 MCG PO TABS
225.0000 ug | ORAL_TABLET | Freq: Every day | ORAL | Status: DC
  Administered 2011-12-25 – 2011-12-27 (×3): 225 ug via ORAL
  Filled 2011-12-25 (×3): qty 1

## 2011-12-25 NOTE — Progress Notes (Signed)
Patient has had 2 episodes of vomiting tonight.  It seems to be undigested food from 6:30 pm last night per patient. She states that she has regurgitation really bad and throws up some at home but not quite this bad.   After giving PM medications patient began vomiting and provider on call was notified.  New orders for portable abdominal xray were given.

## 2011-12-25 NOTE — Progress Notes (Addendum)
[facility] PROGRESS NOTE  [name] ZOX:[id number] DOB: May 26, 1919 DOA: 12/24/2011 PCP: [name], MD  Brief narrative: 78 year old female with PMH significant for Diabetes, Hypothyroidism, Diastolic Hearth failure, CKD with baseline creatinine 1.4 who presents to ED complaining of generalize weakness. ED evaluation revealed stable compression fractures at T9, T11 and T12 and based on abdominal x ray large stool burden.  Assessment/Plan:  Principal Problem: *Generalized weakness  Perhaps due to multiple co-morbidities, deconditioning due to age  Need PT evaluation  Active Problems:  DIABETES MELLITUS, TYPE II, UNCONTROLLED  Will continue current insulin regimen with insulin 3 units TID and Glargine 15 units at bedtime  Diabetic neuropathy  Continue gabapentin Constipation  Start miralax, colace and biscodyl UTI (lower urinary tract infection)  Continue Cipro 400 mg IV  Hypothyroidism  Continue levothyroxine Hypertension  Continue Cardizem and imdur  Continue coreg   Acute kidney injury  Secondary to dehydration and UTI  Continue current IV fluids  Creatinine trending down  Hyponatremia  Secondary to dehydration  Continue IV fluids  Code Status: full code Family Communication: no family at bedside Disposition Plan: needs PT evaluation  Manson Passey, MD  [facility] Pager [phone]  If 7PM-7AM, please contact night-coverage [email or web address] Password [email or web address] 12/25/2011, 7:09 AM   LOS: 1 day   Consultants:  Physical therapy  Procedures:  None   Antibiotics:  Cipro 12/24/2011 -->  HPI/Subjective: No acute overnight events.  Objective: Filed Vitals:   12/24/11 1700 12/24/11 2151 12/24/11 2155 12/25/11 0554  BP: 156/60 155/65  156/67  Pulse: 72 69  64  Temp: 97.6 F (36.4 C) 97.5 F (36.4 C)  97.7 F (36.5 C)  TempSrc: Oral Oral  Oral  Resp: 16 18  18   Height:      Weight:    81.2 kg (179 lb 0.2 oz)  SpO2: 95% 88% 99% 98%     Intake/Output Summary (Last 24 hours) at 12/25/11 0709 Last data filed at 12/25/11 0620  Gross per 24 hour  Intake   1364 ml  Output    800 ml  Net    564 ml    Exam:   General:  [name] alert, not in acute distress  Cardiovascular: Regular rate and rhythm, S1/S2  Respiratory: Clear to auscultation bilaterally, no wheezing  Abdomen: Soft, non tender, non distended, bowel sounds present, no guarding  Extremities: Pulses DP and PT palpable bilaterally  Neuro: Grossly nonfocal  Data Reviewed: Basic Metabolic Panel:  Lab 12/25/11 8295 12/24/11 1352  NA 127* 127*  K 4.7 5.1  CL 90* 87*  CO2 29 28  GLUCOSE 293* 249*  BUN 41* 54*  CREATININE 1.92* 2.81*  CALCIUM 8.9 9.5   Liver Function Tests:  Lab 12/24/11 1352  AST 25  ALT 18  ALKPHOS 86  BILITOT 0.2*  PROT 7.5  ALBUMIN 3.3*   CBC:  Lab 12/25/11 0405 12/24/11 1352  WBC 9.0 10.3  HGB 11.8* 11.9*  HCT 35.4* 35.2*  MCV 86.1 85.6  PLT 296 303   Cardiac Enzymes:  Lab 12/24/11 1352  TROPONINI <0.30   CBG:  Lab 12/24/11 2155 12/24/11 1738 12/24/11 1400  GLUCAP 198* 219* 240*    Studies: Dg Chest 2 View 12/24/2011  *  IMPRESSION:  1.  Borderline cardiomegaly with pulmonary vascular congestion. 2.  Small focal opacity the left lung base could reflect atelectasis or small focus of airspace disease. 3.  Stable chronic compression fractures of T9, T11, and T12.  Dg Abd Portable 1v 12/25/2011  *  IMPRESSION:  1.  Large amount of stool noted throughout the abdomen and pelvis, though the rectum appears empty.  This is concerning for significant constipation.  No free intra-abdominal air seen. 2.  Nonspecific focal apparent calcific density superior to the right side of the sacrum; this may simply reflect stool.  Follow-up abdominal radiograph could be considered after treatment for the patient's constipation, to ensure resolution.      Scheduled Meds:   . carvedilol  6.25 mg Oral BID WC  . ciprofloxacin   400 mg Intravenous Q12H  . diltiazem  180 mg Oral Daily  . gabapentin  300 mg Oral BID  . heparin  5,000 Units Subcutaneous Q8H  . insulin aspart  0-9 Units Subcutaneous TID WC  . insulin glargine  15 Units Subcutaneous QHS  . isosorbide mononitrate  30 mg Oral Daily  . levothyroxine  225 mcg Oral QAC breakfast  . multivitamin   1 tablet Oral Daily  . pantoprazole   40 mg Intravenous QHS  . traZODone  50 mg Oral QHS  . vitamin B-12  1,000 mcg Oral Daily  . vitamin E  100 Units Oral Daily   Continuous Infusions:   . sodium chloride 100 mL/hr at 12/25/11 0305

## 2011-12-26 ENCOUNTER — Inpatient Hospital Stay (HOSPITAL_COMMUNITY): Payer: Medicare Other

## 2011-12-26 DIAGNOSIS — K5900 Constipation, unspecified: Secondary | ICD-10-CM

## 2011-12-26 LAB — GLUCOSE, CAPILLARY: Glucose-Capillary: 317 mg/dL — ABNORMAL HIGH (ref 70–99)

## 2011-12-26 LAB — BASIC METABOLIC PANEL
GFR calc Af Amer: 29 mL/min — ABNORMAL LOW (ref >90)
GFR calc non Af Amer: 25 mL/min — ABNORMAL LOW (ref >90)
Glucose, Bld: 206 mg/dL — ABNORMAL HIGH (ref 70–99)
Potassium: 4.5 mEq/L (ref 3.5–5.1)
Sodium: 130 mEq/L — ABNORMAL LOW (ref 135–145)

## 2011-12-26 LAB — CBC
Hemoglobin: 11 g/dL — ABNORMAL LOW (ref 12.0–15.0)
MCHC: 32.7 g/dL (ref 30.0–36.0)
RDW: 13.7 % (ref 11.5–15.5)

## 2011-12-26 MED ORDER — INSULIN ASPART 100 UNIT/ML ~~LOC~~ SOLN
0.0000 [IU] | Freq: Every day | SUBCUTANEOUS | Status: DC
  Administered 2011-12-26: 2 [IU] via SUBCUTANEOUS

## 2011-12-26 MED ORDER — FUROSEMIDE 10 MG/ML IJ SOLN
40.0000 mg | Freq: Once | INTRAMUSCULAR | Status: AC
  Administered 2011-12-26: 40 mg via INTRAVENOUS
  Filled 2011-12-26: qty 4

## 2011-12-26 MED ORDER — INSULIN ASPART 100 UNIT/ML ~~LOC~~ SOLN
0.0000 [IU] | Freq: Three times a day (TID) | SUBCUTANEOUS | Status: DC
  Administered 2011-12-26: 7 [IU] via SUBCUTANEOUS
  Administered 2011-12-26: 15 [IU] via SUBCUTANEOUS
  Administered 2011-12-27: 4 [IU] via SUBCUTANEOUS
  Administered 2011-12-27: 11 [IU] via SUBCUTANEOUS

## 2011-12-26 MED ORDER — ALBUTEROL SULFATE (5 MG/ML) 0.5% IN NEBU
2.5000 mg | INHALATION_SOLUTION | RESPIRATORY_TRACT | Status: DC | PRN
  Administered 2011-12-26: 2.5 mg via RESPIRATORY_TRACT
  Filled 2011-12-26: qty 0.5

## 2011-12-26 MED ORDER — HYDRALAZINE HCL 20 MG/ML IJ SOLN
10.0000 mg | Freq: Four times a day (QID) | INTRAMUSCULAR | Status: DC | PRN
  Administered 2011-12-26: 10 mg via INTRAVENOUS
  Filled 2011-12-26: qty 1

## 2011-12-26 MED ORDER — PANTOPRAZOLE SODIUM 40 MG PO TBEC
40.0000 mg | DELAYED_RELEASE_TABLET | Freq: Every day | ORAL | Status: DC
  Administered 2011-12-26 – 2011-12-27 (×2): 40 mg via ORAL
  Filled 2011-12-26: qty 1

## 2011-12-26 NOTE — Progress Notes (Signed)
Clinical Social Work Department BRIEF PSYCHOSOCIAL ASSESSMENT 12/26/2011  Patient:  [name], [name]     Account Number:  [id number]     Admit date:  12/24/2011  Clinical Social Worker:  Leron Croak, CLINICAL SOCIAL WORKER  Date/Time:  12/26/2011 07:28 PM  Referred by:  Physician  Date Referred:  12/26/2011 Referred for  SNF Placement   Other Referral:   Interview type:  Patient Other interview type:    PSYCHOSOCIAL DATA Living Status:  ALONE Admitted from facility:   Level of care:   Primary support name:  [facility] Primary support relationship to patient:  CHILD, ADULT Degree of support available:   Pt has support from daughter and home RN    CURRENT CONCERNS Current Concerns  Post-Acute Placement   Other Concerns:    SOCIAL WORK ASSESSMENT / PLAN CSW met with the Pt at the bedside. Pt was alert and aware of consult for rehabilitation (SNF) placement. Pt has been at [facility] on numerous occassions and would like to go to that facility again. Pt would like to only send her information to above facility and if they were to say no bed was available then Pt gives permission to search other facility SNF options.   Assessment/plan status:  Information/Referral to [facility] Other assessment/ plan:   Information/referral to community resources:   CSW attempted to provide a SNF listing, however Pt does not want to entertain other facilities at this time and declined listing at this time.    PATIENT'S/FAMILY'S RESPONSE TO PLAN OF CARE: Pt was appreciative for assistance and hopeful for [facility] SNF placement.   Leron Croak, LCSWA [employer] [phone]

## 2011-12-26 NOTE — Progress Notes (Signed)
Pt OOB with assist of one, transfers well with assist to [facility]. Had BM x3, soft and brown. She has expiratory wheezes in her RT. Upper and lower quadrant. Pt. Becomes SOB when she gets OOB to the bedside commode. She desat. To 78%, O2 applied @ 2L via [place]-Sat back up to 100%.

## 2011-12-26 NOTE — Plan of Care (Signed)
Problem: Phase II Progression Outcomes Goal: Voiding independently Outcome: Progressing Assist of one

## 2011-12-26 NOTE — Evaluation (Signed)
Physical Therapy Evaluation Patient Details Name: [name] MRN: [id number] DOB: 11/15/1919 Today's Date: 12/26/2011 Time: 4098-1191 PT Time Calculation (min): 29 min  PT Assessment / Plan / Recommendation Clinical Impression  Patient is a 78 yo female admitted with weakness and UTI.  Patient with general weakness impacting functional mobility.  Patient does not have 24 hour assist.  Recommend ST-SNF for continued therapy at discharge.  Patient will benefit from acute PT to maximize independence and increased activity tolerance prior to discharge to next level of care.    PT Assessment  Patient needs continued PT services    Follow Up Recommendations  SNF    Does the patient have the potential to tolerate intense rehabilitation      Barriers to Discharge Decreased caregiver support      Equipment Recommendations  None recommended by PT    Recommendations for Other Services     Frequency Min 3X/week    Precautions / Restrictions Precautions Precautions: Fall Restrictions Weight Bearing Restrictions: No   Pertinent Vitals/Pain       Mobility  Bed Mobility Bed Mobility: Supine to Sit;Sit to Supine;Sitting - Scoot to Edge of Bed Supine to Sit: 4: Min assist;With rails;HOB flat Sitting - Scoot to [place] of Bed: 4: Min guard Sit to Supine: 4: Min guard;HOB flat Details for Bed Mobility Assistance: verbal cues for technique.  Assist to raise trunk off bed to come to sitting. Transfers Transfers: Sit to Stand;Stand to Sit Sit to Stand: 4: Min assist;With upper extremity assist;From bed Stand to Sit: 4: Min guard;With upper extremity assist;To bed Details for Transfer Assistance: Verbal cues for hand placement and safety.  Assist to initiate standing from bed.  Assist for balance. Ambulation/Gait Ambulation/Gait Assistance: 4: Min assist Ambulation Distance (Feet): 72 Feet Assistive device: Rolling walker Ambulation/Gait Assistance Details: Verbal cues to stand upright  and stay close to RW during gait.  Noted instability of bil. knees - buckling.  Gait Pattern: Step-through pattern;Decreased stride length;Right flexed knee in stance;Left flexed knee in stance;Trunk flexed Gait velocity: Slow gait speed           PT Diagnosis: Difficulty walking;Abnormality of gait;Generalized weakness;Altered mental status  PT Problem List: Decreased strength;Decreased activity tolerance;Decreased balance;Decreased mobility;Decreased cognition;Decreased safety awareness;Cardiopulmonary status limiting activity PT Treatment Interventions: DME instruction;Gait training;Functional mobility training;Balance training;Patient/family education;Cognitive remediation   PT Goals Acute Rehab PT Goals PT Goal Formulation: With patient Time For Goal Achievement: 01/09/12 Potential to Achieve Goals: Good Pt will go Supine/Side to Sit: Independently;with HOB 0 degrees PT Goal: Supine/Side to Sit - Progress: Goal set today Pt will go Sit to Stand: with modified independence;with upper extremity assist PT Goal: Sit to Stand - Progress: Goal set today Pt will go Stand to Sit: with modified independence;with upper extremity assist PT Goal: Stand to Sit - Progress: Goal set today Pt will Ambulate: 51 - 150 feet;with modified independence;with rolling walker (without loss of balance) PT Goal: Ambulate - Progress: Goal set today  Visit Information  Last PT Received On: 12/26/11 Assistance Needed: +1    Subjective Data  Subjective: "I've been to [facility] before - they know me there" Patient Stated Goal: To get back home eventually   Prior Functioning  Home Living Lives With: Alone Available Help at Discharge: Personal care attendant (4 hours/day, 5 days/week) Type of Home: House Home Access: Stairs to enter [employer] of Steps: 2 Entrance Stairs-Rails: Right;Left Home Layout: One level Bathroom Shower/Tub: Health visitor: Administrator  Accessibility: Yes How Accessible: Accessible via walker Home Adaptive Equipment: Grab bars in shower;Walker - rolling;Straight cane;Shower chair with back;Bedside commode/3-in-1;Wheelchair - manual Prior Function Level of Independence: Independent with assistive device(s);Needs assistance Needs Assistance: Light Housekeeping;Meal Prep Meal Prep: Moderate Light Housekeeping: Maximal Able to Take Stairs?: Yes Driving: No Vocation: Retired Musician: HOH Dominant Hand: Right    Cognition  Overall Cognitive Status: Impaired Area of Impairment: Safety/judgement;Problem solving Arousal/Alertness: Awake/alert Orientation Level: Appears intact for tasks assessed Behavior During Session: [facility] for tasks performed Safety/Judgement: Decreased safety judgement for tasks assessed    Extremity/Trunk Assessment Right Upper Extremity Assessment RUE ROM/Strength/Tone: WFL for tasks assessed (General weakness throughout) RUE Sensation: WFL - Light Touch Left Upper Extremity Assessment LUE ROM/Strength/Tone: WFL for tasks assessed LUE Sensation: WFL - Light Touch Right Lower Extremity Assessment RLE ROM/Strength/Tone: Deficits RLE ROM/Strength/Tone Deficits: Strength 4/5 RLE Sensation: WFL - Light Touch Left Lower Extremity Assessment LLE ROM/Strength/Tone: Deficits LLE ROM/Strength/Tone Deficits: Strength 4/5 LLE Sensation: WFL - Light Touch   Balance Balance Balance Assessed: Yes Static Sitting Balance Static Sitting - Balance Support: Right upper extremity supported;Feet supported Static Sitting - Level of Assistance: 5: Stand by assistance;4: Min assist Static Sitting - Comment/# of Minutes: Patient sat on EOB x 6 minutes.  Patient with flexed posture.  Lost balance posteriorly x2 requiring min assist to regain upright posture.  End of Session PT - End of Session Equipment Utilized During Treatment: Gait belt Activity Tolerance: Patient limited by fatigue Patient left:  in bed;with call bell/phone within reach Nurse Communication: Mobility status  GP     Vena Austria 12/26/2011, 4:46 PM Durenda Hurt. Renaldo Fiddler, [facility] Acute Rehab Services Pager [phone]

## 2011-12-26 NOTE — Progress Notes (Signed)
[facility] PROGRESS NOTE  [name] ZOX:[id number] DOB: 06/23/1919 DOA: 12/24/2011 PCP: [name], MD  Brief narrative: 78 year old female with PMH significant for Diabetes, Hypothyroidism, Diastolic Hearth failure, CKD with baseline creatinine 1.4 who presents to ED complaining of generalize weakness. ED evaluation revealed stable compression fractures at T9, T11 and T12 and based on abdominal x ray large stool burden. We have started bowel regimen with miralax, colace and bisacodyl. Patient did have 1 BM since then.  Assessment/Plan:   Principal Problem:  *Generalized weakness  Perhaps due to multiple co-morbidities, deconditioning due to age  Need PT evaluation and likely placement to SNF short term  Active Problems:  DIABETES MELLITUS, TYPE II, UNCONTROLLED  Will continue current insulin regimen with insulin 3 units TID and Glargine 15 units at bedtime Diabetic neuropathy  Continue gabapentin PT evaluation Constipation  Started miralax, colace and biscodyl UTI (lower urinary tract infection)  Continue Cipro 400 mg IV Hypothyroidism  Continue Levothyroxine Hypertension  Continue Cardizem and imdur  Continue Coreg  Acute kidney injury  Secondary to dehydration and UTI  Creatinine trending down with IV fluids Will continue the same regimen Hyponatremia  Secondary to dehydration  Sodium stable at 127 Continue IV fluids  Code Status: full code  Family Communication: no family at bedside  Disposition Plan: needs PT evaluation and likely SNF placement  Manson Passey, MD  [facility]  Pager [phone]   Consultants:  Physical therapy SW for nursing home placement  Procedures:  None  Antibiotics:  Cipro 12/24/2011 -->  If 7PM-7AM, please contact night-coverage [email or web address] Password [facility] 12/26/2011, 7:18 AM   LOS: 2 days   HPI/Subjective: No acute overnight events. No nausea and/or vomiting.  Objective: Filed Vitals:   12/25/11 2211 12/26/11 0458  12/26/11 0505 12/26/11 0548  BP: 138/60  128/47 118/62  Pulse: 66  65   Temp: 99.1 F (37.3 C)  98.4 F (36.9 C)   TempSrc: Oral  Oral   Resp: 16  18   Height:      Weight:      SpO2: 90% 94% 94%     Intake/Output Summary (Last 24 hours) at 12/26/11 0718 Last data filed at 12/26/11 0549  Gross per 24 hour  Intake   2194 ml  Output   1526 ml  Net    668 ml    Exam:   General:  [name] alert, follows commands appropriately, not in acute distress  Cardiovascular: Regular rate and rhythm, S1/S2, no murmurs, no rubs, no gallops  Respiratory: Clear to auscultation bilaterally, no wheezing, no crackles, no rhonchi  Abdomen: Soft, non tender, non distended, bowel sounds present, no guarding  Extremities: No edema, pulses DP and PT palpable bilaterally  Neuro: Grossly nonfocal  Data Reviewed: Basic Metabolic Panel:  Lab 12/25/11 8295 12/24/11 1352  NA 127* 127*  K 4.7 5.1  CL 90* 87*  CO2 29 28  GLUCOSE 293* 249*  BUN 41* 54*  CREATININE 1.92* 2.81*  CALCIUM 8.9 9.5   Liver Function Tests:  Lab 12/24/11 1352  AST 25  ALT 18  ALKPHOS 86  BILITOT 0.2*  PROT 7.5  ALBUMIN 3.3*   CBC:  Lab 12/25/11 0405 12/24/11 1352  WBC 9.0 10.3  HGB 11.8* 11.9*  HCT 35.4* 35.2*  MCV 86.1 85.6  PLT 296 303   Cardiac Enzymes:  Lab 12/24/11 1352  CKTOTAL --  CKMB --  TROPONINI <0.30   CBG:  Lab 12/25/11 2126 12/25/11 1633 12/25/11 1141 12/25/11  1610 12/24/11 2155  GLUCAP 235* 365* 293* 211* 198*    URINE CULTURE     Status: Normal (Preliminary result)   Collection Time   12/24/11  1:43 PM      Component Value Range Status Comment   Specimen Description URINE, CATH  Final    Colony Count 45,000 COLONIES/ML   Final    Culture GRAM NEGATIVE RODS   Final    Report Status PENDING   Incomplete      Studies: Dg Chest 2 View 12/24/2011  * IMPRESSION:  1.  Borderline cardiomegaly with pulmonary vascular congestion. 2.  Small focal opacity the left lung base could  reflect atelectasis or small focus of airspace disease. 3.  Stable chronic compression fractures of T9, T11, and T12.   Original Report Authenticated By: Britta Mccreedy, M.D.    Dg Abd Portable 1v 12/25/2011  *  IMPRESSION:  1.  Large amount of stool noted throughout the abdomen and pelvis, though the rectum appears empty.  This is concerning for significant constipation.  No free intra-abdominal air seen. 2.  Nonspecific focal apparent calcific density superior to the right side of the sacrum; this may simply reflect stool.  Follow-up abdominal radiograph could be considered after treatment for the patient's constipation, to ensure resolution.   Original Report Authenticated By: Tonia Ghent, M.D.     Scheduled Meds:   . bisacodyl  10 mg Rectal Daily  . carvedilol  6.25 mg Oral BID WC  . ciprofloxacin  400 mg Intravenous Q12H  . diltiazem  180 mg Oral Daily  . docusate sodium  100 mg Oral BID  . gabapentin  300 mg Oral BID  . heparin  5,000 Units Subcutaneous Q8H  . insulin aspart  0-9 Units Subcutaneous TID WC  . insulin glargine  15 Units Subcutaneous QHS  . isosorbide mononitrate  30 mg Oral Daily  . levothyroxine  225 mcg Oral QAC breakfast  . multivitamin   1 tablet Oral Daily  . pantoprazole   40 mg Intravenous QHS  . polyethylene glycol  17 g Oral BID  . traZODone  50 mg Oral QHS  . vitamin B-12  1,000 mcg Oral Daily  . vitamin E  100 Units Oral Daily   Continuous Infusions:   . sodium chloride 100 mL/hr at 12/26/11 0045

## 2011-12-26 NOTE — Progress Notes (Signed)
PHARMACIST - PHYSICIAN COMMUNICATION CONCERNING: IV to Oral Route Change Policy  RECOMMENDATION: This patient is receiving Protonix by the intravenous route.  Based on criteria approved by the Pharmacy and Therapeutics Committee, this drug is/are being converted to the equivalent oral dose form(s):  . The patient is eating (either orally or per tube) and/or has been taking other orally administered medications for at least 24 hours.  . This patient has no evidence of active gastrointestinal bleeding or impaired GI absorption (gastrectomy, short bowel, patient on TNA or NPO).   If you have questions about this conversion, please contact the pharmacy department.  If you have questions about this conversion, please contact the Pharmacy Department  []  ( [phone] )  [facility] []  ( [phone] )    []  ( [phone] )  [facility] [x]  ( [phone])  [facility]  

## 2011-12-26 NOTE — Progress Notes (Signed)
Clinical Social Work Department CLINICAL SOCIAL WORK PLACEMENT NOTE 12/26/2011  Patient:  [name], [name]  Account Number:  [id number] Admit date:  12/24/2011  Clinical Social Worker:  Leron Croak, CLINICAL SOCIAL WORKER  Date/time:  12/26/2011 07:57 PM  Clinical Social Work is seeking post-discharge placement for this patient at the following level of care:   SKILLED NURSING   (*CSW will update this form in Epic as items are completed)   12/26/2011  Patient/family provided with [facility] Department of Clinical Social Work's list of facilities offering this level of care within the geographic area requested by the patient (or if unable, by the patient's family).  12/26/2011  Patient/family informed of their freedom to choose among providers that offer the needed level of care, that participate in Medicare, Medicaid or managed care program needed by the patient, have an available bed and are willing to accept the patient.  12/26/2011  Patient/family informed of [facility]' ownership interest in [facility], as well as of the fact that they are under no obligation to receive care at this facility.  PASARR submitted to EDS on 12/26/2011 PASARR number received from EDS on 12/26/2011  FL2 transmitted to all facilities in geographic area requested by pt/family on  12/26/2011 FL2 transmitted to all facilities within larger geographic area on 12/26/2011  Patient informed that his/her managed care company has contracts with or will negotiate with  certain facilities, including the following:   Pt only wants to pursue [facility] at this time. Pt has been to preferred facility on multiple occassions.     Patient/family informed of bed offers received:   Patient chooses bed at  Physician recommends and patient chooses bed at    Patient to be transferred to  on   Patient to be transferred to facility by   The following physician request were entered in  Epic:   Additional Comments:  Venancio Poisson Weekend Coverage [phone]

## 2011-12-27 LAB — URINE CULTURE

## 2011-12-27 LAB — GLUCOSE, CAPILLARY: Glucose-Capillary: 196 mg/dL — ABNORMAL HIGH (ref 70–99)

## 2011-12-27 MED ORDER — ALBUTEROL SULFATE (5 MG/ML) 0.5% IN NEBU: 2.5000 mg | INHALATION_SOLUTION | RESPIRATORY_TRACT | Status: DC | PRN

## 2011-12-27 MED ORDER — BISACODYL 10 MG RE SUPP: 10.0000 mg | Freq: Every day | RECTAL | Status: DC

## 2011-12-27 MED ORDER — ACETAMINOPHEN 325 MG PO TABS: 650.0000 mg | ORAL_TABLET | Freq: Four times a day (QID) | ORAL | Status: DC | PRN

## 2011-12-27 MED ORDER — TRAZODONE HCL 50 MG PO TABS: 50.0000 mg | ORAL_TABLET | Freq: Every day | ORAL | Status: DC

## 2011-12-27 MED ORDER — DSS 100 MG PO CAPS: 100.0000 mg | ORAL_CAPSULE | Freq: Two times a day (BID) | ORAL | Status: DC

## 2011-12-27 MED ORDER — PANTOPRAZOLE SODIUM 40 MG PO TBEC: 40.0000 mg | DELAYED_RELEASE_TABLET | Freq: Every day | ORAL | Status: DC

## 2011-12-27 MED ORDER — CIPROFLOXACIN HCL 500 MG PO TABS: 500.0000 mg | ORAL_TABLET | Freq: Two times a day (BID) | ORAL | Status: DC

## 2011-12-27 MED ORDER — POLYETHYLENE GLYCOL 3350 17 G PO PACK: 17.0000 g | PACK | Freq: Two times a day (BID) | ORAL | Status: DC

## 2011-12-27 NOTE — Progress Notes (Signed)
Pts bp at 2319 was 186/78, provider on call notified and ordered hydralazine 10mg  IV.  B/P was rechecked and was 158/66.  Will continue to monitor.

## 2011-12-27 NOTE — Progress Notes (Signed)
Pt. Discharged to [facility], daughter here to bring her mother there. Report Called to [place] at [facility]. Pt denies any pain. VS stable. Paper work sent with pt.

## 2011-12-27 NOTE — Progress Notes (Signed)
Inpatient Diabetes Program Recommendations  AACE/ADA: New Consensus Statement on Inpatient Glycemic Control (2013)  Target Ranges:  Prepandial:   less than 140 mg/dL      Peak postprandial:   less than 180 mg/dL (1-2 hours)      Critically ill patients:  140 - 180 mg/dL   Reason for Visit: Hyperglycemia  Results for [name], [name] (MRN [id number]) as of 12/27/2011 09:43  Ref. Range 12/26/2011 07:25 12/26/2011 11:38 12/26/2011 16:47 12/26/2011 21:31 12/27/2011 07:53  Glucose-Capillary Latest Range: 70-99 mg/dL 130 (H) 865 (H) 784 (H) 232 (H) 196 (H)  Results for [name], [name] (MRN [id number]) as of 12/27/2011 09:43  Ref. Range 04/21/2011 23:05  Hemoglobin A1C Latest Range: <5.7 % 8.0 (H)  Results for [name], [name] (MRN [id number]) as of 12/27/2011 09:43  Ref. Range 12/26/2011 08:24  Sodium Latest Range: 135-145 mEq/L 130 (L)  Potassium Latest Range: 3.5-5.1 mEq/L 4.5  Chloride Latest Range: 96-112 mEq/L 95 (L)  CO2 Latest Range: 19-32 mEq/L 29  BUN Latest Range: 6-23 mg/dL 31 (H)  Creatinine Latest Range: 0.50-1.10 mg/dL 7.25 (H)  Calcium Latest Range: 8.4-10.5 mg/dL 8.9  GFR calc non Af Amer Latest Range: >90 mL/min 25 (L)  GFR calc Af Amer Latest Range: >90 mL/min 29 (L)  Glucose Latest Range: 70-99 mg/dL 366 (H)    Inpatient Diabetes Program Recommendations Insulin - Basal: Increase Lantus to 20 units QHS (On 30 - 42 units at home) Insulin - Meal Coverage: Add meal coverage insulin - Novolog 4 units tidwc  (On 12 units tid at home) HgbA1C: Last HgbA1C was 04/21/2011 - 8.0% - Please update.  Note: Will continue to follow.

## 2011-12-27 NOTE — Plan of Care (Signed)
Problem: Discharge Progression Outcomes Goal: Barriers To Progression Addressed/Resolved Outcome: Progressing Discharge to [facility] for Pt before being able to go home

## 2011-12-27 NOTE — Discharge Summary (Signed)
Physician Discharge Summary  [name] ZOX:[id number] DOB: February 01, 1919 DOA: 12/24/2011  PCP: [name], MD  Admit date: 12/24/2011 Discharge date: 12/27/2011  Recommendations for Outpatient Follow-up:  1. Take ciprofloxacin for next 5 days upon discharge for Klebsiella UTI  Discharge Diagnoses:  Principal Problem:  *UTI (lower urinary tract infection) Active Problems:  DIABETES MELLITUS, TYPE II, UNCONTROLLED  DIABETIC PERIPHERAL NEUROPATHY  HYPERTENSION, BENIGN ESSENTIAL  Dehydration  Hypothyroidism  Acute renal injury  Hyponatremia  Constipation  Discharge Condition: Medically stable for discharge to skilled nursing facility today  Diet recommendation: Carb modified diet or as tolerated  History of present illness:  78 year old female with PMH significant for Diabetes, Hypothyroidism, Diastolic Hearth failure, CKD with baseline creatinine 1.4 who presents to ED complaining of generalize weakness. ED evaluation revealed stable compression fractures at T9, T11 and T12 and based on abdominal x ray large stool burden. We have started bowel regimen with miralax, colace and bisacodyl. Patient did have BM since then.   Assessment/Plan:   Principal Problem:  *Generalized weakness  Perhaps due to multiple co-morbidities, deconditioning due to age  Per physical therapy evaluation recommendation was for skilled nursing facility and the patient and family agreed  Active Problems:  DIABETES MELLITUS, TYPE II, UNCONTROLLED  Will continue current home insulin regimen Diabetic neuropathy  Continue gabapentin  Constipation  Started miralax, colace and biscodyl and this regimen will be continued even in the nursing home UTI (lower urinary tract infection)  Secondary to Klebsiella pneumonia sensitive to ciprofloxacin Continue Cipro 500 mg twice daily for 5 days Hypothyroidism  Continue Levothyroxine Hypertension  Continue Cardizem and imdur  Continue Coreg  Acute kidney injury   Secondary to dehydration and UTI  Creatinine trending down Hyponatremia  Secondary to dehydration  Sodium stable at 127   Code Status: full code  Family Communication: no family at bedside  Disposition Plan: To skilled nursing facility today  Manson Passey, MD  [facility]  Pager [phone]   Consultants:  Physical therapy  SW for nursing home placement  Procedures:  None  Antibiotics:  Cipro 12/24/2011 --> we will continue ciprofloxacin 500 mg twice daily for next 5 days on discharge  Discharge Exam: Filed Vitals:   12/27/11 1001  BP: 153/72  Pulse:   Temp:   Resp:    Filed Vitals:   12/27/11 0014 12/27/11 0241 12/27/11 0638 12/27/11 1001  BP: 158/66 149/54 153/72 153/72  Pulse:  71    Temp:   98.2 F (36.8 C)   TempSrc:   Oral   Resp:   16   Height:      Weight:   81 kg (178 lb 9.2 oz)   SpO2:  93% 96%     General: [name] alert, follows commands appropriately, not in acute distress Cardiovascular: Regular rate and rhythm, S1/S2 +, no murmurs, no rubs, no gallops Respiratory: Clear to auscultation bilaterally, no wheezing, no crackles, no rhonchi Abdominal: Soft, non tender, non distended, bowel sounds +, no guarding Extremities: no edema, no cyanosis, pulses palpable bilaterally DP and PT Neuro: Grossly nonfocal  Discharge Instructions  Discharge Orders    Future Appointments: Provider: Department: Dept Phone: Center:   12/31/2011 1:15 PM [facility] [phone] [facility]   01/28/2012 8:00 AM [facility] [phone] [facility]     Future Orders Please Complete By Expires   Diet - low sodium heart healthy  Increase activity slowly      Call MD for:  persistant nausea and vomiting      Call MD for:  severe uncontrolled pain      Call MD for:  difficulty breathing, headache or visual disturbances      Call MD for:  persistant dizziness or light-headedness            Medication List     As of 12/27/2011 12:31 PM    STOP taking these medications         paregoric 2 MG/5ML solution      TAKE these medications         acetaminophen 325 MG tablet   Commonly known as: TYLENOL   Take 2 tablets (650 mg total) by mouth every 6 (six) hours as needed for pain or fever (or Fever >/= 101).      albuterol (5 MG/ML) 0.5% nebulizer solution   Commonly known as: PROVENTIL   Take 0.5 mLs (2.5 mg total) by nebulization every 2 (two) hours as needed for wheezing or shortness of breath.      bisacodyl 10 MG suppository   Commonly known as: DULCOLAX   Place 1 suppository (10 mg total) rectally daily.      calcium carbonate 1250 MG tablet   Commonly known as: OS-CAL - dosed in mg of elemental calcium   Take 1 tablet by mouth daily.      carvedilol 6.25 MG tablet   Commonly known as: COREG   Take 6.25 mg by mouth 2 (two) times daily with a meal.      ciprofloxacin 500 MG tablet   Commonly known as: CIPRO   Take 1 tablet (500 mg total) by mouth 2 (two) times daily.      diltiazem 180 MG 24 hr capsule   Commonly known as: CARDIZEM CD   Take 180 mg by mouth daily.      DSS 100 MG Caps   Take 100 mg by mouth 2 (two) times daily.      furosemide 20 MG tablet   Commonly known as: LASIX   Take 20 mg by mouth 2 (two) times daily.      gabapentin 300 MG capsule   Commonly known as: NEURONTIN   Take 300 mg by mouth 2 (two) times daily.      ibuprofen 200 MG tablet   Commonly known as: ADVIL,MOTRIN   Take 200 mg by mouth every 6 (six) hours as needed. pain      insulin glargine 100 UNIT/ML injection   Commonly known as: LANTUS   Inject 30-42 Units into the skin at bedtime.      insulin lispro 100 UNIT/ML injection   Commonly known as: HUMALOG   Inject 12 Units into the skin 3 (three) times daily as needed. Depends on blood sugar      isosorbide mononitrate 30 MG 24 hr tablet   Commonly known as: IMDUR   Take 30 mg by mouth daily.       levothyroxine 75 MCG tablet   Commonly known as: SYNTHROID, LEVOTHROID   Take 3 tablets (225 mcg total) by mouth daily before breakfast.      multivitamin with minerals Tabs   Take 1 tablet by mouth daily.      pantoprazole 40 MG tablet   Commonly known as: PROTONIX   Take 1 tablet (40 mg total) by mouth daily.      polyethylene glycol packet   Commonly known as: MIRALAX /  GLYCOLAX   Take 17 g by mouth 2 (two) times daily.      Tamsulosin HCl 0.4 MG Caps   Commonly known as: FLOMAX   Take 0.4 mg by mouth daily.      traZODone 50 MG tablet   Commonly known as: DESYREL   Take 1 tablet (50 mg total) by mouth at bedtime.      vitamin B-12 1000 MCG tablet   Commonly known as: CYANOCOBALAMIN   Take 1,000 mcg by mouth daily.      vitamin E 100 UNIT capsule   Take 100 Units by mouth daily.           Follow-up Information    Follow up with [name], MD. In 2 weeks. (If symptoms worsen)    Contact information:   [address] [place] [address] [phone]           The results of significant diagnostics from this hospitalization (including imaging, microbiology, ancillary and laboratory) are listed below for reference.    Significant Diagnostic Studies: Dg Chest 1 View  12/26/2011  *RADIOLOGY REPORT*  Clinical Data: Shortness of breath.  Wheezing.  CHEST - 1 VIEW  Comparison: 11/29, 04/01, and 04/24/2011  Findings: There is chronic cardiomegaly. There is recurrent pulmonary vascular congestion with interstitial accentuation which may represent mild edema.  No consolidative infiltrates or effusions.  No acute osseous abnormality.   IMPRESSION: Recurrent pulmonary vascular congestion and possible mild interstitial edema.   Original Report Authenticated By: Francene Boyers, M.D.    Dg Chest 2 View  12/24/2011  *RADIOLOGY REPORT*  Clinical Data: Altered mental status and shortness of breath  CHEST - 2 VIEW  Comparison: Multiple prior chest radiographs,  most recent 04/26/2011.  Lumbar spine radiographs 01/13/2010.  Findings: Stable borderline/mild cardiomegaly. There is mild pulmonary vascular congestion.  There is bronchial wall thickening. Small focal opacity at the left lung base could reflect atelectasis or a small focus of airspace disease.  No definite pulmonary edema. Negative for pleural effusion or pneumothorax.  There is a remote healed fracture deformity of the proximal right humerus, stable.  There are remote healed left-sided rib fractures. Remote fracture remote compression fractures T9, T11, and T12 are stable.  No acute thoracic spine fracture is identified.  There is atherosclerotic calcification of the abdominal aorta.  IMPRESSION:  1.  Borderline cardiomegaly with pulmonary vascular congestion. 2.  Small focal opacity the left lung base could reflect atelectasis or small focus of airspace disease. 3.  Stable chronic compression fractures of T9, T11, and T12.   Original Report Authenticated By: Britta Mccreedy, M.D.    Dg Abd Portable 1v  12/25/2011  *RADIOLOGY REPORT*  Clinical Data: Vomiting and abdominal distension; history of diabetes.  PORTABLE ABDOMEN - 1 VIEW  Comparison: Abdominal radiograph performed 01/20/2010  Findings: There appears to be a large amount of stool throughout the abdomen and pelvis, though the rectum appears empty.  This is concerning for constipation.  A small amount of air is noted within the stomach.  No definite free intra-abdominal air is identified, though evaluation for free air is suboptimal on a single supine view.  Evaluation of the osseous structures is suboptimal due to overlying stool.  Degenerative change is noted along the lower thoracic and lumbar spine.  Focal density superior to the right side of the sacrum is nonspecific in appearance.  IMPRESSION:  1.  Large amount of stool noted throughout the abdomen and pelvis, though the rectum appears  empty.  This is concerning for significant constipation.  No  free intra-abdominal air seen. 2.  Nonspecific focal apparent calcific density superior to the right side of the sacrum; this may simply reflect stool.  Follow-up abdominal radiograph could be considered after treatment for the patient's constipation, to ensure resolution.   Original Report Authenticated By: Tonia Ghent, M.D.     Microbiology: Recent Results (from the past 240 hour(s))  URINE CULTURE     Status: Normal   Collection Time   12/24/11  1:43 PM      Component Value Range Status Comment   Specimen Description URINE, CATHETERIZED   Final    Special Requests NONE   Final    Culture  Setup Time 12/25/2011 01:31   Final    Colony Count 45,000 COLONIES/ML   Final    Culture KLEBSIELLA PNEUMONIAE   Final    Report Status 12/27/2011 FINAL   Final    Organism ID, Bacteria KLEBSIELLA PNEUMONIAE   Final      Labs: Basic Metabolic Panel:  Lab 12/26/11 4098 12/25/11 0405 12/24/11 1830 12/24/11 1352  NA 130* 127* -- 127*  K 4.5 4.7 -- 5.1  CL 95* 90* -- 87*  CO2 29 29 -- 28  GLUCOSE 206* 293* -- 249*  BUN 31* 41* -- 54*  CREATININE 1.70* 1.92* -- 2.81*  CALCIUM 8.9 8.9 -- 9.5  MG -- -- 2.3 --  PHOS -- -- -- --   Liver Function Tests:  Lab 12/24/11 1352  AST 25  ALT 18  ALKPHOS 86  BILITOT 0.2*  PROT 7.5  ALBUMIN 3.3*   CBC:  Lab 12/26/11 0824 12/25/11 0405 12/24/11 1352  WBC 9.2 9.0 10.3  HGB 11.0* 11.8* 11.9*  HCT 33.6* 35.4* 35.2*  MCV 88.0 86.1 85.6  PLT 297 296 303   Cardiac Enzymes:  Lab 12/24/11 1352  CKTOTAL --  CKMB --  TROPONINI <0.30   BNP: BNP (last 3 results)  Basename 04/27/11 0450 04/24/11 1106 04/23/11 0505  PROBNP 2796.0* 8616.0* 4651.0*   CBG:  Lab 12/27/11 1156 12/27/11 0753 12/26/11 2131 12/26/11 1647 12/26/11 1138  GLUCAP 253* 196* 232* 241* 317*    Time coordinating discharge: Over 30 minutes  Signed:  Manson Passey, MD  [facility] 12/27/2011, 12:31 PM  Pager #: [phone]

## 2011-12-27 NOTE — Plan of Care (Signed)
Problem: Discharge Progression Outcomes Goal: Discharge plan in place and appropriate Outcome: Adequate for Discharge To [facility]

## 2011-12-28 NOTE — Progress Notes (Signed)
Clinical Social Work Department CLINICAL SOCIAL WORK PLACEMENT NOTE 12/28/2011  Patient:  [name], [name]  Account Number:  [id number] Admit date:  12/24/2011  Clinical Social Worker:  Leron Croak, CLINICAL SOCIAL WORKER  Date/time:  12/26/2011 07:57 PM  Clinical Social Work is seeking post-discharge placement for this patient at the following level of care:   SKILLED NURSING   (*CSW will update this form in Epic as items are completed)   12/26/2011  Patient/family provided with [facility] Department of Clinical Social Work's list of facilities offering this level of care within the geographic area requested by the patient (or if unable, by the patient's family).  12/26/2011  Patient/family informed of their freedom to choose among providers that offer the needed level of care, that participate in Medicare, Medicaid or managed care program needed by the patient, have an available bed and are willing to accept the patient.  12/26/2011  Patient/family informed of [facility]' ownership interest in [facility], as well as of the fact that they are under no obligation to receive care at this facility.  PASARR submitted to EDS on 12/26/2011 PASARR number received from EDS on 12/26/2011  FL2 transmitted to all facilities in geographic area requested by pt/family on  12/26/2011 FL2 transmitted to all facilities within larger geographic area on 12/26/2011  Patient informed that his/her managed care company has contracts with or will negotiate with  certain facilities, including the following:   Pt only wants to pursue [facility] at this time. Pt has been to preferred facility on multiple occassions.     Patient/family informed of bed offers received:  12/27/2011 Patient chooses bed at [facility] Physician recommends and patient chooses bed at    Patient to be transferred to [facility] on  12/27/2011 Patient to be transferred to facility by FAMILY  The following  physician request were entered in Epic:   Additional Comments:  Cori Razor LCSW [phone]

## 2011-12-31 ENCOUNTER — Encounter (HOSPITAL_BASED_OUTPATIENT_CLINIC_OR_DEPARTMENT_OTHER): Payer: Medicare Other | Attending: General Surgery

## 2011-12-31 DIAGNOSIS — E1169 Type 2 diabetes mellitus with other specified complication: Secondary | ICD-10-CM | POA: Insufficient documentation

## 2011-12-31 DIAGNOSIS — L97509 Non-pressure chronic ulcer of other part of unspecified foot with unspecified severity: Secondary | ICD-10-CM | POA: Insufficient documentation

## 2012-01-28 ENCOUNTER — Encounter (HOSPITAL_BASED_OUTPATIENT_CLINIC_OR_DEPARTMENT_OTHER): Payer: Medicare Other

## 2012-02-03 ENCOUNTER — Inpatient Hospital Stay (HOSPITAL_COMMUNITY)
Admission: EM | Admit: 2012-02-03 | Discharge: 2012-02-08 | DRG: 057 | Disposition: A | Discharge status: Rehabilitation Facility | Payer: Medicare Other | Attending: Internal Medicine | Admitting: Internal Medicine

## 2012-02-03 ENCOUNTER — Emergency Department (HOSPITAL_COMMUNITY): Payer: Medicare Other

## 2012-02-03 ENCOUNTER — Encounter (HOSPITAL_COMMUNITY): Payer: Self-pay | Admitting: Emergency Medicine

## 2012-02-03 DIAGNOSIS — M4802 Spinal stenosis, cervical region: Secondary | ICD-10-CM | POA: Diagnosis present

## 2012-02-03 DIAGNOSIS — E039 Hypothyroidism, unspecified: Secondary | ICD-10-CM

## 2012-02-03 DIAGNOSIS — Z7982 Long term (current) use of aspirin: Secondary | ICD-10-CM

## 2012-02-03 DIAGNOSIS — R471 Dysarthria and anarthria: Secondary | ICD-10-CM | POA: Diagnosis present

## 2012-02-03 DIAGNOSIS — N390 Urinary tract infection, site not specified: Secondary | ICD-10-CM | POA: Diagnosis present

## 2012-02-03 DIAGNOSIS — K5900 Constipation, unspecified: Secondary | ICD-10-CM

## 2012-02-03 DIAGNOSIS — R4182 Altered mental status, unspecified: Secondary | ICD-10-CM

## 2012-02-03 DIAGNOSIS — R531 Weakness: Secondary | ICD-10-CM

## 2012-02-03 DIAGNOSIS — E1142 Type 2 diabetes mellitus with diabetic polyneuropathy: Secondary | ICD-10-CM | POA: Diagnosis present

## 2012-02-03 DIAGNOSIS — I639 Cerebral infarction, unspecified: Secondary | ICD-10-CM | POA: Diagnosis present

## 2012-02-03 DIAGNOSIS — I509 Heart failure, unspecified: Secondary | ICD-10-CM | POA: Diagnosis present

## 2012-02-03 DIAGNOSIS — R627 Adult failure to thrive: Secondary | ICD-10-CM | POA: Diagnosis present

## 2012-02-03 DIAGNOSIS — M8448XA Pathological fracture, other site, initial encounter for fracture: Secondary | ICD-10-CM | POA: Diagnosis present

## 2012-02-03 DIAGNOSIS — R197 Diarrhea, unspecified: Secondary | ICD-10-CM | POA: Diagnosis present

## 2012-02-03 DIAGNOSIS — D649 Anemia, unspecified: Secondary | ICD-10-CM | POA: Diagnosis present

## 2012-02-03 DIAGNOSIS — R4789 Other speech disturbances: Secondary | ICD-10-CM | POA: Diagnosis present

## 2012-02-03 DIAGNOSIS — G8190 Hemiplegia, unspecified affecting unspecified side: Principal | ICD-10-CM | POA: Diagnosis present

## 2012-02-03 DIAGNOSIS — R1310 Dysphagia, unspecified: Secondary | ICD-10-CM | POA: Diagnosis present

## 2012-02-03 DIAGNOSIS — I739 Peripheral vascular disease, unspecified: Secondary | ICD-10-CM | POA: Diagnosis present

## 2012-02-03 DIAGNOSIS — N179 Acute kidney failure, unspecified: Secondary | ICD-10-CM | POA: Diagnosis present

## 2012-02-03 DIAGNOSIS — I129 Hypertensive chronic kidney disease with stage 1 through stage 4 chronic kidney disease, or unspecified chronic kidney disease: Secondary | ICD-10-CM | POA: Diagnosis present

## 2012-02-03 DIAGNOSIS — I252 Old myocardial infarction: Secondary | ICD-10-CM

## 2012-02-03 DIAGNOSIS — E1149 Type 2 diabetes mellitus with other diabetic neurological complication: Secondary | ICD-10-CM | POA: Diagnosis present

## 2012-02-03 DIAGNOSIS — Z79899 Other long term (current) drug therapy: Secondary | ICD-10-CM

## 2012-02-03 DIAGNOSIS — E875 Hyperkalemia: Secondary | ICD-10-CM | POA: Diagnosis present

## 2012-02-03 DIAGNOSIS — I5032 Chronic diastolic (congestive) heart failure: Secondary | ICD-10-CM | POA: Diagnosis present

## 2012-02-03 DIAGNOSIS — Z794 Long term (current) use of insulin: Secondary | ICD-10-CM

## 2012-02-03 DIAGNOSIS — E871 Hypo-osmolality and hyponatremia: Secondary | ICD-10-CM | POA: Diagnosis present

## 2012-02-03 DIAGNOSIS — I2510 Atherosclerotic heart disease of native coronary artery without angina pectoris: Secondary | ICD-10-CM | POA: Diagnosis present

## 2012-02-03 DIAGNOSIS — N189 Chronic kidney disease, unspecified: Secondary | ICD-10-CM | POA: Diagnosis present

## 2012-02-03 DIAGNOSIS — E860 Dehydration: Secondary | ICD-10-CM

## 2012-02-03 DIAGNOSIS — L97509 Non-pressure chronic ulcer of other part of unspecified foot with unspecified severity: Secondary | ICD-10-CM | POA: Diagnosis present

## 2012-02-03 DIAGNOSIS — I10 Essential (primary) hypertension: Secondary | ICD-10-CM | POA: Diagnosis present

## 2012-02-03 LAB — GLUCOSE, CAPILLARY
Glucose-Capillary: 103 mg/dL — ABNORMAL HIGH (ref 70–99)
Glucose-Capillary: 149 mg/dL — ABNORMAL HIGH (ref 70–99)

## 2012-02-03 LAB — CBC WITH DIFFERENTIAL/PLATELET
Basophils Absolute: 0.1 10*3/uL (ref 0.0–0.1)
Basophils Relative: 1 % (ref 0–1)
Eosinophils Relative: 3 % (ref 0–5)
Lymphocytes Relative: 12 % (ref 12–46)
Neutro Abs: 10.8 10*3/uL — ABNORMAL HIGH (ref 1.7–7.7)
Platelets: 285 10*3/uL (ref 150–400)
RDW: 13.4 % (ref 11.5–15.5)
WBC: 13.8 10*3/uL — ABNORMAL HIGH (ref 4.0–10.5)

## 2012-02-03 LAB — LACTIC ACID, PLASMA: Lactic Acid, Venous: 1.5 mmol/L (ref 0.5–2.2)

## 2012-02-03 LAB — URINE MICROSCOPIC-ADD ON

## 2012-02-03 LAB — URINALYSIS, ROUTINE W REFLEX MICROSCOPIC
Ketones, ur: NEGATIVE mg/dL
Nitrite: NEGATIVE
Specific Gravity, Urine: 1.014 (ref 1.005–1.030)
Urobilinogen, UA: 0.2 mg/dL (ref 0.0–1.0)
pH: 5 (ref 5.0–8.0)

## 2012-02-03 LAB — BASIC METABOLIC PANEL
Chloride: 93 mEq/L — ABNORMAL LOW (ref 96–112)
Creatinine, Ser: 2.16 mg/dL — ABNORMAL HIGH (ref 0.50–1.10)
GFR calc Af Amer: 22 mL/min — ABNORMAL LOW (ref >90)
GFR calc non Af Amer: 19 mL/min — ABNORMAL LOW (ref >90)

## 2012-02-03 LAB — TROPONIN I: Troponin I: <0.3 ng/mL (ref <0.30)

## 2012-02-03 MED ORDER — TRAZODONE HCL 50 MG PO TABS
50.0000 mg | ORAL_TABLET | Freq: Every day | ORAL | Status: DC
  Administered 2012-02-03 – 2012-02-07 (×5): 50 mg via ORAL
  Filled 2012-02-03 (×6): qty 1

## 2012-02-03 MED ORDER — ASPIRIN 81 MG PO CHEW
324.0000 mg | CHEWABLE_TABLET | Freq: Once | ORAL | Status: AC
  Administered 2012-02-03: 324 mg via ORAL
  Filled 2012-02-03: qty 4

## 2012-02-03 MED ORDER — ADULT MULTIVITAMIN W/MINERALS CH
1.0000 | ORAL_TABLET | Freq: Every day | ORAL | Status: DC
  Administered 2012-02-03 – 2012-02-08 (×6): 1 via ORAL
  Filled 2012-02-03 (×6): qty 1

## 2012-02-03 MED ORDER — DILTIAZEM HCL ER COATED BEADS 180 MG PO CP24
180.0000 mg | ORAL_CAPSULE | Freq: Every day | ORAL | Status: DC
  Administered 2012-02-03 – 2012-02-07 (×5): 180 mg via ORAL
  Filled 2012-02-03 (×7): qty 1

## 2012-02-03 MED ORDER — ENOXAPARIN SODIUM 30 MG/0.3ML ~~LOC~~ SOLN
30.0000 mg | SUBCUTANEOUS | Status: DC
  Administered 2012-02-03: 30 mg via SUBCUTANEOUS
  Filled 2012-02-03 (×2): qty 0.3

## 2012-02-03 MED ORDER — SODIUM CHLORIDE 0.9 % IV SOLN
INTRAVENOUS | Status: AC
  Administered 2012-02-03 – 2012-02-04 (×2): via INTRAVENOUS

## 2012-02-03 MED ORDER — CALCIUM CARBONATE 1250 (500 CA) MG PO TABS
1.0000 | ORAL_TABLET | Freq: Every day | ORAL | Status: DC
  Administered 2012-02-04 – 2012-02-08 (×5): 500 mg via ORAL
  Filled 2012-02-03 (×5): qty 1

## 2012-02-03 MED ORDER — ISOSORBIDE MONONITRATE ER 30 MG PO TB24
30.0000 mg | ORAL_TABLET | Freq: Every day | ORAL | Status: DC
  Administered 2012-02-04 – 2012-02-08 (×5): 30 mg via ORAL
  Filled 2012-02-03 (×5): qty 1

## 2012-02-03 MED ORDER — SODIUM CHLORIDE 0.9 % IV BOLUS (SEPSIS)
1000.0000 mL | Freq: Once | INTRAVENOUS | Status: AC
  Administered 2012-02-03: 1000 mL via INTRAVENOUS

## 2012-02-03 MED ORDER — ONDANSETRON HCL 4 MG/2ML IJ SOLN: 4.0000 mg | Freq: Four times a day (QID) | INTRAMUSCULAR | Status: DC | PRN

## 2012-02-03 MED ORDER — ONDANSETRON HCL 4 MG PO TABS: 4.0000 mg | ORAL_TABLET | Freq: Four times a day (QID) | ORAL | Status: DC | PRN

## 2012-02-03 MED ORDER — TAMSULOSIN HCL 0.4 MG PO CAPS
0.4000 mg | ORAL_CAPSULE | Freq: Every morning | ORAL | Status: DC
  Administered 2012-02-04 – 2012-02-08 (×5): 0.4 mg via ORAL
  Filled 2012-02-03 (×5): qty 1

## 2012-02-03 MED ORDER — SODIUM CHLORIDE 0.9 % IV SOLN
INTRAVENOUS | Status: AC
  Administered 2012-02-03: 22:00:00 via INTRAVENOUS

## 2012-02-03 MED ORDER — LEVOTHYROXINE SODIUM 200 MCG PO TABS
200.0000 ug | ORAL_TABLET | Freq: Every morning | ORAL | Status: DC
  Administered 2012-02-04: 200 ug via ORAL
  Filled 2012-02-03: qty 1

## 2012-02-03 MED ORDER — ACETAMINOPHEN 325 MG PO TABS: 650.0000 mg | ORAL_TABLET | Freq: Four times a day (QID) | ORAL | Status: DC | PRN

## 2012-02-03 MED ORDER — ACETAMINOPHEN 650 MG RE SUPP: 650.0000 mg | Freq: Four times a day (QID) | RECTAL | Status: DC | PRN

## 2012-02-03 MED ORDER — CARVEDILOL 6.25 MG PO TABS
6.2500 mg | ORAL_TABLET | Freq: Two times a day (BID) | ORAL | Status: DC
  Administered 2012-02-04 – 2012-02-08 (×9): 6.25 mg via ORAL
  Filled 2012-02-03 (×11): qty 1

## 2012-02-03 MED ORDER — PANTOPRAZOLE SODIUM 40 MG PO TBEC
40.0000 mg | DELAYED_RELEASE_TABLET | Freq: Every day | ORAL | Status: DC
  Administered 2012-02-03 – 2012-02-08 (×6): 40 mg via ORAL
  Filled 2012-02-03 (×6): qty 1

## 2012-02-03 MED ORDER — INSULIN ASPART 100 UNIT/ML ~~LOC~~ SOLN
0.0000 [IU] | Freq: Three times a day (TID) | SUBCUTANEOUS | Status: DC
  Administered 2012-02-04: 3 [IU] via SUBCUTANEOUS
  Administered 2012-02-04 (×2): 2 [IU] via SUBCUTANEOUS

## 2012-02-03 MED ORDER — GABAPENTIN 300 MG PO CAPS
300.0000 mg | ORAL_CAPSULE | Freq: Two times a day (BID) | ORAL | Status: DC
  Administered 2012-02-03 – 2012-02-08 (×10): 300 mg via ORAL
  Filled 2012-02-03 (×11): qty 1

## 2012-02-03 MED ORDER — SODIUM CHLORIDE 0.9 % IV SOLN: INTRAVENOUS | Status: AC

## 2012-02-03 NOTE — ED Notes (Signed)
Per patient was hospitalized for high CBG/dehydration/bladder infection day before Thanksgiving-went to rehab for 3 weeks, discharged day before Xmas-has had right sided weakness since returning home-family member states speech slurred, unsteady gait-per family, nothing has changed since being d/ced form hospital/rehab-CBG has been unstable as well

## 2012-02-03 NOTE — ED Notes (Addendum)
Notified RN, Ariza CBG 149.

## 2012-02-03 NOTE — H&P (Signed)
History and Physical  [name] NWG:[id number] DOB: April 03, 1919 DOA: 02/03/2012  Referring physician: ER at [facility] PCP: [name], MD   Chief Complaint: Generalized weakness  HPI:  [name] is a 79 y.o. female with PMH significant for Diabetes, hypothyroidism, Diastolic Hearth failure, Chronic renal failure Cr baseline at 1.4 who presents to ED complaining of generalized weakness. Patient said that she noticed some drooling from her mouth this morning. She also felt weak on her right lower leg and right arm but this weakness has been present for many weeks.  She relates she has dysuria and frequency wets herself as she is unable to control her urine. Patient told me that she had yogurt and CT was for breakfast and pie for lunch. She denies any choking while having her breakfast or lunch today. Patient also denies any abdominal pain, fever, chills. Patient lives by herself and and has aid come in in the morning only for a few hours. She has chronic diarrhea for which she takes medications. She feels weak and tire. She denies chest pain or dyspnea.    Review of Systems: The patient denies anorexia, fever, weight loss, vision loss, decreased hearing, hoarseness, chest pain, syncope, dyspnea on exertion, peripheral edema, balance deficits, hemoptysis, abdominal pain, melena, hematochezia, severe indigestion/heartburn, hematuria, genital sores, suspicious skin lesions, transient blindness, depression, unusual weight change, abnormal bleeding, enlarged lymph nodes, angioedema, and breast masses.    Past Medical History  Diagnosis Date  . Myocardial infarction   . Coronary artery disease   . Hypertension   . Dysrhythmia   . Peripheral vascular disease   . Hypothyroidism   . Diabetes mellitus   . Presbyesophagus 04/23/2011  . Urinary retention with incomplete bladder emptying 04/23/2011  . UTI (lower urinary tract infection) 04/21/2011  . Diastolic CHF, chronic     Grade I diastolic  dysfunction on Echo 06/01/82  . Anaphylaxis 04/25/2011    Thought to be from Rocephin    Past Surgical History  Procedure Date  . Cholecystectomy   . Colonoscopy 01/15/2011    Procedure: COLONOSCOPY;  Surgeon: Theda Belfast, MD;  Location: [facility] ENDOSCOPY;  Service: Endoscopy;  Laterality: N/A;    Social History:  reports that she quit smoking about 30 years ago. Her smoking use included Cigarettes. She has never used smokeless tobacco. She reports that she drinks alcohol. She reports that she does not use illicit drugs.  Allergies  Allergen Reactions  . Ephedrine   . Morphine Other (See Comments)    Difficulty waking up & talking after surgery  . Rocephin (Ceftriaxone Sodium In Dextrose)     Developed acute resp distress, wheezing after 2nd dose.    No family history on file.   Prior to Admission medications   Medication Sig Start Date End Date Taking? Authorizing Provider  aspirin 81 MG chewable tablet Chew 324 mg by mouth once.   Yes Historical Provider, MD  calcium carbonate (OS-CAL - DOSED IN MG OF ELEMENTAL CALCIUM) 1250 MG tablet Take 1 tablet by mouth daily.   Yes Historical Provider, MD  carvedilol (COREG) 6.25 MG tablet Take 6.25 mg by mouth 2 (two) times daily with a meal.     Yes Historical Provider, MD  diltiazem (CARDIZEM CD) 180 MG 24 hr capsule Take 180 mg by mouth at bedtime.    Yes Historical Provider, MD  furosemide (LASIX) 20 MG tablet Take 20 mg by mouth 2 (two) times daily.     Yes Historical Provider,  MD  gabapentin (NEURONTIN) 300 MG capsule Take 300 mg by mouth 2 (two) times daily.    Yes Historical Provider, MD  insulin glargine (LANTUS) 100 UNIT/ML injection Inject 35 Units into the skin at bedtime.  04/27/11  Yes Christina P Rama, MD  insulin lispro (HUMALOG) 100 UNIT/ML injection Inject 14 Units into the skin 3 (three) times daily as needed. Depends on blood sugar   Yes Historical Provider, MD  isosorbide mononitrate (IMDUR) 30 MG 24 hr tablet Take 30 mg by  mouth daily.     Yes Historical Provider, MD  levothyroxine (SYNTHROID, LEVOTHROID) 200 MCG tablet Take 200 mcg by mouth every morning. Takes along with 25mg  tablet together   Yes Historical Provider, MD  levothyroxine (SYNTHROID, LEVOTHROID) 25 MCG tablet Take 25 mcg by mouth every morning. Takes along with 200mg  tablet together   Yes Historical Provider, MD  Multiple Vitamin (MULITIVITAMIN WITH MINERALS) TABS Take 1 tablet by mouth daily.   Yes Historical Provider, MD  pantoprazole (PROTONIX) 40 MG tablet Take 1 tablet (40 mg total) by mouth daily. 12/27/11  Yes Alison Murray, MD  Tamsulosin HCl (FLOMAX) 0.4 MG CAPS Take 0.4 mg by mouth every morning.    Yes Historical Provider, MD  traZODone (DESYREL) 50 MG tablet Take 1 tablet (50 mg total) by mouth at bedtime. 12/27/11  Yes Alison Murray, MD   Physical Exam: Filed Vitals:   02/03/12 1811 02/03/12 1812 02/03/12 1818 02/03/12 1928  BP: 120/66 134/60 126/54 117/60  Pulse: 70 71 73 63  Temp:    98 F (36.7 C)  TempSrc:    Oral  Resp:    15  SpO2:    95%  General: elderly lady in no acute distress. Ill appearing.  Eyes: PERLA, an icteric.  ENT: No tonsillar enlargement, dry tongue and lips.  Neck: Supple, No JVD.  Cardiovascular: S1 and S2 normal, regular rate and rhythm, no murmurs rubs or gallops Respiratory: respiration unlabored, No Wheezes, no crackles.  Abdomen: Bs present, soft, no rigidity, no tender.  Skin: Skin tear left elbow.  Musculoskeletal: no deformity.  Neurologic: Alert oriented x4, 5 out of 5 strength in both upper extremities, strength is 4/5 in right lower extremity and 5 out of 5 in left lower extremity, cranial notes 2-12 intact, reflexes are brisk in both upper and lower extremities, Babinski sign not noted on bilateral leg, no incoordination noted while doing finger to nose, no sensory deficits noted.   Wt Readings from Last 3 Encounters:  12/27/11 178 lb 9.2 oz (81 kg)  04/26/11 176 lb 2.4 oz (79.9 kg)    01/15/11 175 lb (79.379 kg)    Labs on Admission:  Basic Metabolic Panel:  Lab 02/03/12 1610  NA 129*  K 5.5*  CL 93*  CO2 26  GLUCOSE 105*  BUN 44*  CREATININE 2.16*  CALCIUM 9.3  MG --  PHOS --    CBC:  Lab 02/03/12 1840  WBC 13.8*  NEUTROABS 10.8*  HGB 12.0  HCT 36.6  MCV 88.2  PLT 285    Cardiac Enzymes:  Lab 02/03/12 1840  CKTOTAL --  CKMB --  CKMBINDEX --  TROPONINI <0.30    BNP (last 3 results)  Basename 04/27/11 0450 04/24/11 1106 04/23/11 0505  PROBNP 2796.0* 8616.0* 4651.0*    CBG:  Lab 02/03/12 1659  GLUCAP 103*     Radiological Exams on Admission: Ct Head Wo Contrast  02/03/2012  *RADIOLOGY REPORT*  Clinical Data: Slurred speech.  Unsteady gait.  Right-sided weakness.  CT HEAD WITHOUT CONTRAST  Technique:  Contiguous axial images were obtained from the base of the skull through the vertex without contrast.  Comparison: 02/21/2008  Findings: There is no evidence for acute infarction, intracranial hemorrhage, mass lesion, hydrocephalus, or extra-axial fluid. Moderate to severe atrophy is present.  Advanced chronic microvascular ischemic change is present in the periventricular and subcortical white matter.  Chronic hypodensity left insular cortex.  No CT signs of proximal vascular thrombosis in the left MCA territory.  No evidence for chronic subdural hematoma.  Calvarium intact.  Advanced carotid and vertebral atherosclerosis.  Clear sinuses and mastoids.  IMPRESSION: Stable exam.  Atrophy and chronic microvascular ischemic change without visible acute stroke or hemorrhage.   Original Report Authenticated By: Davonna Belling, M.D.     EKG: Independently reviewed. 63 beats per minute, normal axis, no ST or T wave changes noted   Principal Problem:  *UTI (lower urinary tract infection) Active Problems:  DIABETES MELLITUS, TYPE II, UNCONTROLLED  DIABETIC PERIPHERAL NEUROPATHY  HYPERTENSION, BENIGN ESSENTIAL  Dehydration  Hypothyroidism  Acute  renal injury  Hyponatremia   Assessment/Plan  UTI: Patient presents with FTT and generalized weakness, UA with many bacteria, too numerous to count WBCs and positive leukocytes. I will start Levaquin at this time. Urine culture ordered.   FTT: Treat for infection, correct electrolytes abnormalities. Consult physical therapy and occupational therapy and possibly place the patient in an assisted living facility.  Acute on Chronic renal failure: Patient cr baseline 1.4. Worsening renal function likely pre-renal, secondary to decrease volume, secondary to infection. I will start IV fluids. Strict I and O. Will consider renal US if no improvement.  -hold Lasix.  Generalize weakness/ Fall: multifactorial, secondary to dehydration, infection, deconditioning. Although there was a concern for right-sided weakness and possible stroke by the ER PA, I could not find any signs, symptoms or physical exam features consistent with stroke. Patient will be monitored in telemetry overnight but maybe transfer to a MedSurg bed by the morning MD.  Hyponatremia: This is chronic and was present on previous admission as well. In the setting of decrease volume. Continue with IV fluids.   Hypothyroidism: Continue with Synthroid.  Last TSH 0.548 on 11/29  Diabetes: SSI. Check Hba1c. Last checked 3/27 and was 8.0.   Code Status: full Family Communication: Patient updated about the plan, no family present at bedside Disposition Plan/Anticipated LOS: Patient will likely go to assisted living facility/will depend on evaluation by physical therapy  Time spent: 70 minutes  Lars Mage, MD  [facility] Team 8 If 7PM-7AM, please contact night-coverage at [email or web address], password [facility] 02/03/2012, 9:19 PM

## 2012-02-03 NOTE — ED Provider Notes (Signed)
History     CSN: [id number]  Arrival date & time 02/03/12  1646   First MD Initiated Contact with Patient 02/03/12 1743      Chief Complaint  Patient presents with  . right sided weakness     (Consider location/radiation/quality/duration/timing/severity/associated sxs/prior treatment) HPI Comments: [name] is a 79 y.o. female with a hx of CAD, DM, and PVD presents to the ER with her daughter who most of the history is obtained from. [name] here for change in mental status, slurred speech, generalized weakness (R>L), and inability to care for self at home. The daughter reports that her mother had a UTI that she was admitted for last month and ever since discharge her health has continued to decline. She was evaluated by her PCP on Tuesday for follow up at which point he reccommended she come to hospital for stroke r/o. No other complaints at this time  The history is provided by the patient, a relative and medical records.    Past Medical History  Diagnosis Date  . Myocardial infarction   . Coronary artery disease   . Hypertension   . Dysrhythmia   . Peripheral vascular disease   . Hypothyroidism   . Diabetes mellitus   . Presbyesophagus 04/23/2011  . Urinary retention with incomplete bladder emptying 04/23/2011  . UTI (lower urinary tract infection) 04/21/2011  . Diastolic CHF, chronic     Grade I diastolic dysfunction on Echo 04/26/11  . Anaphylaxis 04/25/2011    Thought to be from Rocephin    Past Surgical History  Procedure Date  . Cholecystectomy   . Colonoscopy 01/15/2011    Procedure: COLONOSCOPY;  Surgeon: Theda Belfast, MD;  Location: [facility];  Service: Endoscopy;  Laterality: N/A;    No family history on file.  History  Substance Use Topics  . Smoking status: Former Smoker    Types: Cigarettes    Quit date: 04/20/1981  . Smokeless tobacco: Never Used  . Alcohol Use: Yes     Comment: social    OB History    Grav Para Term Preterm Abortions TAB  SAB Ect Mult Living                  Review of Systems  Constitutional: Positive for fatigue. Negative for fever, diaphoresis and activity change.  HENT: Negative for congestion and neck pain.   Respiratory: Negative for cough.   Genitourinary: Negative for dysuria.  Musculoskeletal: Negative for myalgias.  Skin: Negative for color change and wound.  Neurological: Positive for speech difficulty and weakness. Negative for headaches.  All other systems reviewed and are negative.    Allergies  Ephedrine; Morphine; and Rocephin  Home Medications   Current Outpatient Rx  Name  Route  Sig  Dispense  Refill  . ASPIRIN 81 MG PO CHEW   Oral   Chew 324 mg by mouth once.         Marland Kitchen CALCIUM CARBONATE 1250 MG PO TABS   Oral   Take 1 tablet by mouth daily.         Marland Kitchen CARVEDILOL 6.25 MG PO TABS   Oral   Take 6.25 mg by mouth 2 (two) times daily with a meal.           . DILTIAZEM HCL ER COATED BEADS 180 MG PO CP24   Oral   Take 180 mg by mouth at bedtime.          . FUROSEMIDE 20 MG PO  TABS   Oral   Take 20 mg by mouth 2 (two) times daily.           Marland Kitchen GABAPENTIN 300 MG PO CAPS   Oral   Take 300 mg by mouth 2 (two) times daily.          . INSULIN GLARGINE 100 UNIT/ML [name] SOLN   Subcutaneous   Inject 35 Units into the skin at bedtime.          . INSULIN LISPRO (HUMAN) 100 UNIT/ML [name] SOLN   Subcutaneous   Inject 14 Units into the skin 3 (three) times daily as needed. Depends on blood sugar         . ISOSORBIDE MONONITRATE ER 30 MG PO TB24   Oral   Take 30 mg by mouth daily.           Marland Kitchen LEVOTHYROXINE SODIUM 200 MCG PO TABS   Oral   Take 200 mcg by mouth every morning. Takes along with 25mg  tablet together         . LEVOTHYROXINE SODIUM 25 MCG PO TABS   Oral   Take 25 mcg by mouth every morning. Takes along with 200mg  tablet together         . ADULT MULTIVITAMIN W/MINERALS CH   Oral   Take 1 tablet by mouth daily.         Marland Kitchen PANTOPRAZOLE SODIUM  40 MG PO TBEC   Oral   Take 1 tablet (40 mg total) by mouth daily.   30 tablet   0   . TAMSULOSIN HCL 0.4 MG PO CAPS   Oral   Take 0.4 mg by mouth every morning.          . TRAZODONE HCL 50 MG PO TABS   Oral   Take 1 tablet (50 mg total) by mouth at bedtime.   30 tablet   0     BP 117/60  Pulse 63  Temp 98 F (36.7 C) (Oral)  Resp 15  SpO2 95%  Physical Exam  Nursing note and vitals reviewed. Constitutional: She is oriented to person, place, and time. She appears well-developed and well-nourished. No distress.  HENT:  Head: Normocephalic and atraumatic.  Mouth/Throat: Oropharynx is clear and moist. No oropharyngeal exudate.  Eyes: Conjunctivae normal and EOM are normal. Pupils are equal, round, and reactive to light. No scleral icterus.  Neck: Normal range of motion. Neck supple. No tracheal deviation present. No thyromegaly present.  Cardiovascular: Normal rate, regular rhythm, normal heart sounds and intact distal pulses.   Pulmonary/Chest: Effort normal and breath sounds normal. No stridor. No respiratory distress. She has no wheezes.  Abdominal: Soft.  Musculoskeletal: Normal range of motion. She exhibits no edema and no tenderness.  Neurological: She is alert and oriented to person, place, and time.       Poor coordination of right hand, decreased sensation of right lower extremity & right upper extremity with weakness and + pronator drift.   Skin: Skin is warm and dry. No rash noted. She is not diaphoretic. No erythema. No pallor.  Psychiatric: She has a normal mood and affect. Her behavior is normal.    ED Course  Procedures (including critical care time)  Labs Reviewed  GLUCOSE, CAPILLARY - Abnormal; Notable for the following:    Glucose-Capillary 103 (*)     All other components within normal limits  CBC WITH DIFFERENTIAL - Abnormal; Notable for the following:    WBC 13.8 (*)  Neutrophils Relative 78 (*)     Neutro Abs 10.8 (*)     All other  components within normal limits  URINALYSIS, ROUTINE W REFLEX MICROSCOPIC - Abnormal; Notable for the following:    APPearance TURBID (*)     Hgb urine dipstick LARGE (*)     Protein, ur 100 (*)     Leukocytes, UA LARGE (*)     All other components within normal limits  BASIC METABOLIC PANEL - Abnormal; Notable for the following:    Sodium 129 (*)     Potassium 5.5 (*)     Chloride 93 (*)     Glucose, Bld 105 (*)     BUN 44 (*)     Creatinine, Ser 2.16 (*)     GFR calc non Af Amer 19 (*)     GFR calc Af Amer 22 (*)     All other components within normal limits  URINE MICROSCOPIC-ADD ON - Abnormal; Notable for the following:    Bacteria, UA MANY (*)     All other components within normal limits  TROPONIN I  URINE CULTURE   Ct Head Wo Contrast  02/03/2012  *RADIOLOGY REPORT*  Clinical Data: Slurred speech.  Unsteady gait.  Right-sided weakness.  CT HEAD WITHOUT CONTRAST  Technique:  Contiguous axial images were obtained from the base of the skull through the vertex without contrast.  Comparison: 02/21/2008  Findings: There is no evidence for acute infarction, intracranial hemorrhage, mass lesion, hydrocephalus, or extra-axial fluid. Moderate to severe atrophy is present.  Advanced chronic microvascular ischemic change is present in the periventricular and subcortical white matter.  Chronic hypodensity left insular cortex.  No CT signs of proximal vascular thrombosis in the left MCA territory.  No evidence for chronic subdural hematoma.  Calvarium intact.  Advanced carotid and vertebral atherosclerosis.  Clear sinuses and mastoids.  IMPRESSION: Stable exam.  Atrophy and chronic microvascular ischemic change without visible acute stroke or hemorrhage.   Original Report Authenticated By: Davonna Belling, M.D.     Date: 02/03/2012  Rate: 63  Rhythm: normal sinus rhythm  QRS Axis: normal  Intervals: normal  ST/T Wave abnormalities: normal  Conduction Disutrbances: none  Narrative Interpretation:    Old EKG Reviewed: No significant changes noted     No diagnosis found.    MDM  MATISYN CABEZA is a 79 y.o. female that presented to ER for alt mental status change and generalized weakness (Right side > left). Labs and imaging reviewed, hyperkalemia without peak T changes on EKG, acute on chronic kidney disease (started on IV fluids), & urosepsis started on Levaquin IV, urine culture pending. The patient appears reasonably stabilized for admission considering the current resources, flow, and capabilities available in the ED at this time, and I doubt any other [facility] requiring further screening and/or treatment in the ED prior to admission. Neuro evaluation recommended in morning.          Jaci Carrel, [place] 02/03/12 2019

## 2012-02-03 NOTE — ED Provider Notes (Signed)
Medical screening examination/treatment/procedure(s) were performed by non-physician practitioner and as supervising physician I was immediately available for consultation/collaboration.  Juliet Rude. Rubin Payor, MD 02/03/12 2355

## 2012-02-04 ENCOUNTER — Inpatient Hospital Stay (HOSPITAL_COMMUNITY): Payer: Medicare Other

## 2012-02-04 DIAGNOSIS — I639 Cerebral infarction, unspecified: Secondary | ICD-10-CM | POA: Diagnosis present

## 2012-02-04 DIAGNOSIS — E875 Hyperkalemia: Secondary | ICD-10-CM

## 2012-02-04 DIAGNOSIS — I6350 Cerebral infarction due to unspecified occlusion or stenosis of unspecified cerebral artery: Secondary | ICD-10-CM

## 2012-02-04 DIAGNOSIS — I369 Nonrheumatic tricuspid valve disorder, unspecified: Secondary | ICD-10-CM

## 2012-02-04 DIAGNOSIS — N390 Urinary tract infection, site not specified: Secondary | ICD-10-CM

## 2012-02-04 DIAGNOSIS — N179 Acute kidney failure, unspecified: Secondary | ICD-10-CM

## 2012-02-04 DIAGNOSIS — N189 Chronic kidney disease, unspecified: Secondary | ICD-10-CM

## 2012-02-04 LAB — GLUCOSE, CAPILLARY
Glucose-Capillary: 229 mg/dL — ABNORMAL HIGH (ref 70–99)
Glucose-Capillary: 189 mg/dL — ABNORMAL HIGH (ref 70–99)
Glucose-Capillary: 218 mg/dL — ABNORMAL HIGH (ref 70–99)

## 2012-02-04 LAB — LIPID PANEL
HDL: 55 mg/dL (ref >39)
LDL Cholesterol: 79 mg/dL (ref 0–99)
Total CHOL/HDL Ratio: 3 RATIO

## 2012-02-04 LAB — BASIC METABOLIC PANEL
BUN: 38 mg/dL — ABNORMAL HIGH (ref 6–23)
Chloride: 100 mEq/L (ref 96–112)
GFR calc Af Amer: 25 mL/min — ABNORMAL LOW (ref >90)
GFR calc non Af Amer: 22 mL/min — ABNORMAL LOW (ref >90)
Potassium: 4.6 mEq/L (ref 3.5–5.1)
Sodium: 135 mEq/L (ref 135–145)

## 2012-02-04 LAB — HEMOGLOBIN A1C
Mean Plasma Glucose: 226 mg/dL — ABNORMAL HIGH (ref <117)
Mean Plasma Glucose: 229 mg/dL — ABNORMAL HIGH (ref <117)

## 2012-02-04 MED ORDER — ENOXAPARIN SODIUM 30 MG/0.3ML ~~LOC~~ SOLN
30.0000 mg | SUBCUTANEOUS | Status: DC
  Administered 2012-02-04 – 2012-02-07 (×4): 30 mg via SUBCUTANEOUS
  Filled 2012-02-04 (×5): qty 0.3

## 2012-02-04 MED ORDER — INSULIN GLARGINE 100 UNIT/ML ~~LOC~~ SOLN
20.0000 [IU] | Freq: Every day | SUBCUTANEOUS | Status: DC
  Administered 2012-02-04 – 2012-02-07 (×4): 20 [IU] via SUBCUTANEOUS

## 2012-02-04 MED ORDER — LEVOTHYROXINE SODIUM 200 MCG PO TABS
200.0000 ug | ORAL_TABLET | Freq: Every day | ORAL | Status: DC
  Administered 2012-02-05 – 2012-02-08 (×4): 200 ug via ORAL
  Filled 2012-02-04 (×6): qty 1

## 2012-02-04 MED ORDER — BACITRACIN-NEOMYCIN-POLYMYXIN 400-5-5000 EX OINT
TOPICAL_OINTMENT | Freq: Every day | CUTANEOUS | Status: DC
  Administered 2012-02-05: 1 via TOPICAL
  Administered 2012-02-06: 10:00:00 via TOPICAL
  Administered 2012-02-07 – 2012-02-08 (×2): 1 via TOPICAL

## 2012-02-04 MED ORDER — RESOURCE THICKENUP CLEAR PO POWD
ORAL | Status: DC | PRN
  Filled 2012-02-04: qty 125

## 2012-02-04 MED ORDER — LEVOTHYROXINE SODIUM 25 MCG PO TABS
25.0000 ug | ORAL_TABLET | Freq: Every day | ORAL | Status: DC
  Administered 2012-02-05 – 2012-02-08 (×4): 25 ug via ORAL
  Filled 2012-02-04 (×5): qty 1

## 2012-02-04 MED ORDER — ASPIRIN 300 MG RE SUPP
300.0000 mg | Freq: Every day | RECTAL | Status: DC
  Administered 2012-02-04: 300 mg via RECTAL
  Filled 2012-02-04 (×5): qty 1

## 2012-02-04 MED ORDER — SENNOSIDES-DOCUSATE SODIUM 8.6-50 MG PO TABS
1.0000 | ORAL_TABLET | Freq: Every evening | ORAL | Status: DC | PRN
  Filled 2012-02-04: qty 1

## 2012-02-04 MED ORDER — CIPROFLOXACIN IN D5W 400 MG/200ML IV SOLN
400.0000 mg | INTRAVENOUS | Status: DC
  Administered 2012-02-04 – 2012-02-05 (×2): 400 mg via INTRAVENOUS
  Filled 2012-02-04 (×3): qty 200

## 2012-02-04 MED ORDER — ASPIRIN 325 MG PO TABS
325.0000 mg | ORAL_TABLET | Freq: Every day | ORAL | Status: DC
  Administered 2012-02-05 – 2012-02-08 (×4): 325 mg via ORAL
  Filled 2012-02-04 (×5): qty 1

## 2012-02-04 NOTE — Progress Notes (Signed)
OT Cancellation Note  Patient Details Name: [name] MRN: [id number] DOB: 01-12-1920   Cancelled Treatment:    Reason Eval/Treat Not Completed: Other (comment) (pt getting ready to eat breakfast and note pt for tests and pending neuro eval today. )  [name] [phone] 02/04/2012, 12:24 PM

## 2012-02-04 NOTE — Evaluation (Addendum)
Clinical/Bedside Swallow Evaluation Patient Details  Name: [name] MRN: [id number] Date of Birth: 1919/05/19  Today's Date: 02/04/2012 Time: 5621-3086 SLP Time Calculation (min): 65 min  Past Medical History:  Past Medical History  Diagnosis Date  . Myocardial infarction   . Coronary artery disease   . Hypertension   . Dysrhythmia   . Peripheral vascular disease   . Hypothyroidism   . Diabetes mellitus   . Presbyesophagus 04/23/2011  . Urinary retention with incomplete bladder emptying 04/23/2011  . UTI (lower urinary tract infection) 04/21/2011  . Diastolic CHF, chronic     Grade I diastolic dysfunction on Echo 04/26/11  . Anaphylaxis 04/25/2011    Thought to be from Rocephin   Past Surgical History:  Past Surgical History  Procedure Date  . Cholecystectomy   . Colonoscopy 01/15/2011    Procedure: COLONOSCOPY;  Surgeon: Theda Belfast, MD;  Location: [facility];  Service: Endoscopy;  Laterality: N/A;   HPI:  79 yo female adm to [facility] with AMS, slurred speech, right sided weakness.  Pt MRI 02/04/12 was negative for acute changes.  PMH + for HTN, chronic renal disease, UTIs, DM, and pt had a fall in November 2013.  Pt does have h/o dysphagia and has undergone MBS in July 2011 and esophagram March 2013.   Pt admits to "regurgitation" with  intake at times but daughter states this has been ongoing for years .  Daughter also reports pt "coughs all the time" and eats at rapid rate.  Daughter also states pt will pocket food in the right side of her mouth and continue to eat even after coughing episode. Pt and daughter deny pt with weight loss or pulmonary infections.    Assessment / Plan / Recommendation Clinical Impression  Pt presents with clinical indicators of oropharyngeal dysphagia and probable overt aspiration of thin liquid characterized by cough.  Excessive coughing noted when pt swished and expectorated water (after oral care) likely from premature spillage of water into  larynx/trachea.  Aspiration/excessive cough with thin resulted in pt's face turning red and dyspnea, but pt and daughter were not alarmed by this occurence, stating this is normal.   Right facial, labial sensorimotor deficits noted and daughter reports pt drooling recently from right side of mouth possible indicating CN deficit.  Pt with known h/o mild oropharyngeal dysphagia dating back to 07/2009 showing prominent cp and questionable developing diverticulum.  [name] more dysarthric and has decreased phonatory strength since this clinician saw her in 2011- pt and daughter agree.    Suspect pt's baseline known multifactorial dysphagia has worsened with this acute event and progressed over the last few months from deconditioning with recent hospitalization (after fall/UTI) that required her to stay at rehab facility short term.       Rec modified diet and MBS to assess swallow function given concerns for aspiration/worsening dysphagia and h/o sensorimotor oropharyngeal deficits.    Suspect pt episodically aspirating PTA with intake based on pt and daughter report and this will likely be ongoing and has been tolerating her aspiration.  MBS will aid in determining strategies/diet to maximize her airway protection but may not fully eliminate aspiration.    Pt and daughter educated to aspiration precautions, suspicions of episodic aspiration, diet modification and compensatory strategies.  Pt will require full assist with meals due to swallowing deficits.  Pt and daughter agree to plan.      Aspiration Risk  Moderate    Diet Recommendation Dysphagia 3 (Mechanical Soft);Nectar-thick  liquid   Liquid Administration via: Cup Medication Administration: Crushed with puree Supervision: Full supervision/cueing for compensatory strategies Compensations: Slow rate;Small sips/bites;Check for pocketing;Multiple dry swallows after each bite/sip;Follow solids with liquid Postural Changes and/or Swallow Maneuvers:  Seated upright 90 degrees;Upright 30-60 min after meal    Other  Recommendations Oral Care Recommendations: Staff/trained caregiver to provide oral care Other Recommendations: Order thickener from pharmacy   Follow Up Recommendations  Home health SLP;Skilled Nursing facility    Frequency and Duration min 2x/week  2 weeks   Pertinent Vitals/Pain Afebrile, decreased    SLP Swallow Goals Patient will utilize recommended strategies during swallow to increase swallowing safety with: Total assistance   Swallow Study Prior Functional Status   regular/thin diet    General Date of Onset: 02/04/12 HPI: 79 yo female adm to [facility] with AMS, slurred speech, right sided weakness.  Pt MRI 02/04/12 was negative for acute changes.  PMH + for HTN, chronic renal disease, UTIs, DM, and pt had a fall in November 2013.  Pt does have h/o dysphagia and has undergone MBS in July 2011 and esophagram March 2013.   Pt admits to "regurgitation" with  intake at times but daughter states this has been ongoing for years .  Daughter also reports pt "coughs all the time" and eats at rapid rate.  Daughter also states pt will pocket food in the right side of her mouth and continue to eat even after coughing episode. Pt and daughter deny pt with weight loss or pulmonary infections.  Type of Study: Bedside swallow evaluation Previous Swallow Assessment: esophagram 03/2011 mild presybesophagus, tertiary contractions, no hiatal hernia  MBS August 19, 2009 (previous study normal, according to patient's daughter) Diet Prior to this Study: NPO Temperature Spikes Noted: No Respiratory Status: Room air History of Recent Intubation: No Behavior/Cognition: Requires cueing;Alert;Cooperative;Pleasant mood Oral Cavity - Dentition: Dentures, bottom;Dentures, top (ill fitting) Self-Feeding Abilities: Able to feed self Patient Positioning: Upright in bed Baseline Vocal Quality: Clear Volitional Cough: Strong Volitional Swallow: Able to  elicit    Oral/Motor/Sensory Function Overall Oral Motor/Sensory Function: Impaired Labial ROM: Within Functional Limits Labial Symmetry: Within Functional Limits Labial Strength: Reduced Labial Sensation: Within Functional Limits Lingual ROM: Within Functional Limits Lingual Symmetry: Within Functional Limits Lingual Strength: Reduced Lingual Sensation: Within Functional Limits Facial ROM: Reduced right Facial Symmetry: Right droop Facial Strength: Reduced Facial Sensation: Within Functional Limits Velum: Within Functional Limits Mandible: Within Functional Limits   Ice Chips Ice chips: Not tested Other Comments:  (While trying to sip from cup, the water leaked from the mout)   Thin Liquid Thin Liquid: Impaired Presentation: Cup;Straw;Self Fed Oral Phase Impairments: Reduced lingual movement/coordination;Impaired anterior to posterior transit Oral Phase Functional Implications: Prolonged oral transit Pharyngeal  Phase Impairments: Suspected delayed Swallow;Decreased hyoid-laryngeal movement;Multiple swallows;Throat Clearing - Immediate;Cough - Immediate Other Comments: overt cough when rinsing and expectorating water after pt brushiing her dentures/mouth and when consuming thin water, suspect aspiration    Nectar Thick Nectar Thick Liquid: Impaired Presentation: Cup;Straw Oral Phase Impairments: Reduced lingual movement/coordination Oral phase functional implications: Prolonged oral transit Pharyngeal Phase Impairments: Suspected delayed Swallow;Multiple swallows;Decreased hyoid-laryngeal movement   Honey Thick Honey Thick Liquid: Not tested   Puree Puree: Impaired Presentation: Self Fed;Spoon Oral Phase Impairments: Impaired anterior to posterior transit;Reduced lingual movement/coordination;Poor awareness of bolus;Reduced labial seal Oral Phase Functional Implications: Oral residue;Right anterior spillage;Right lateral sulci pocketing (oral residual noted & pt continued to put  more food in mouth) Pharyngeal Phase Impairments: Suspected delayed Swallow;Decreased hyoid-laryngeal  movement Other Comments: pt needed assist to clear min amount of applesauce retained on right side of labia, indicating decr labial sensation   Solid   GO           Donavan Burnet, MS [facility] SLP [phone]

## 2012-02-04 NOTE — Progress Notes (Signed)
Inpatient Diabetes Program Recommendations  AACE/ADA: New Consensus Statement on Inpatient Glycemic Control (2013)  Target Ranges:  Prepandial:   less than 140 mg/dL      Peak postprandial:   less than 180 mg/dL (1-2 hours)      Critically ill patients:  140 - 180 mg/dL   Reason for Visit: Hyperglycemia and High HgbA1C  Results for [name], [name] (MRN [id number]) as of 02/04/2012 12:52  Ref. Range 02/03/2012 16:59 02/03/2012 21:54 02/04/2012 07:55 02/04/2012 11:52  Glucose-Capillary Latest Range: 70-99 mg/dL 413 (H) 244 (H) 010 (H) 189 (H)  Results for [name], [name] (MRN [id number]) as of 02/04/2012 12:52  Ref. Range 02/03/2012 18:40  Hemoglobin A1C Latest Range: <5.7 % 9.5 (H)    Inpatient Diabetes Program Recommendations Insulin - Basal: Add Lantus 15 units QHS Insulin - Meal Coverage: Add meal coverage insulin - Novolog 4 units tidwc  Note: Will follow.  Thank you. Ailene Ards, RD, LDN, CDE Inpatient Diabetes Coordinator

## 2012-02-04 NOTE — Progress Notes (Signed)
Follow-up to Bedside Swallow Screen from the Doc Flowsheet section.  This RN introduced to patient and explained the process of the bedside swallow screen. Raised patient to a 90 degree angle and assessed respiratory sounds. Upper and lower lobes were clear bilaterally prior to starting test. Due to patient's lethargy, this RN prompted the patient to kindly open her eyes and observe the cup before her. She gave eye contact to the cup as it was being brought to her lips. As she placed her lips to the cup, this RN slowly tilted a small sip of water. Immediately, water started leaking from both corners of the mouth. Then, a straw was inserted in the cup, and after some prompting to place the patient's lips around the straw, she was able to take a small sip of water. However, it took 3 attempts to gain enough strength to allow the water to proceed all the way up the straw and into her mouth. The patient did not cough or gag when she took a sip from the straw, and this RN did hear her swallow it.  Next, this RN introduced a half of a graham cracker. The patient took a bite off the end, but she had a weak bite, as she rquired assistance to break off the small piece of cracker. She slowly maneuvered the cracker piece in her mouth with her tongue, and then pushed it to the front teeth (dentures) to break it down into crumbs. Then, the crumbs were being pocketed in the right anterior and lateral side of the cheek and gums. This RN placed a gloved finger into the patient's mouth to remove the softened graham cracker piece.   This RN offered a small sip of water with a straw to assist in clearing the mouth. The patient took one small swallow of water, and did not cough or gag while doing so.  While she was sitting at a 90 degree angle, this RN re-assessed all lung sounds, and heard clear sounds in both upper and lower bilateral lungs. This RN kept patient in upright position for 20 minutes before the Radiology tech came  to transport the patient for an MRI.

## 2012-02-04 NOTE — Progress Notes (Signed)
PHARMACY BRIEF NOTE - Antibiotic Renal Dose Adjustment  Pharmacy Consult for:  Adjust Cipro dose for renal insufficiency Indication:  UTI  Patient Measurements: Height: 5\' 8"  (172.7 cm) Weight: 175 lb (79.379 kg) IBW/kg (Calculated) : 63.9    Labs:  Basename 02/04/12 0540 02/03/12 1840  WBC -- 13.8*  HGB -- 12.0  PLT -- 285  LABCREA -- --  CREATININE 1.92* 2.16*   Estimated Creatinine Clearance: 21.6 ml/min (by C-G formula based on Cr of 1.92).  Microbiology: Urine culture of 02/03/12 is pending.  Assessment:  Asked to review the dose of IV Cipro ordered for this 79 year-old female and adjust the dose for renal insufficiency.  There is a history of CKD with baseline serum creatinine of 1.7.  The creatinine clearance is estimated as 21.6 ml/min using SCr 1.92 on 02/04/12.  Goal of Therapy:   Dose appropriate for renal function  Eradication of infection  Plan:   Cipro 400 mg IV every 24 hours, as recommended when  CrCl in range 5 to 29 ml/min.  Follow for changes in renal function.  Polo Riley R.Ph. 02/04/2012 4:17 PM

## 2012-02-04 NOTE — Progress Notes (Signed)
Bilateral:  No evidence of hemodynamically significant internal carotid artery stenosis.   Vertebral artery flow is antegrade.     

## 2012-02-04 NOTE — Progress Notes (Signed)
Clinical Social Work Department CLINICAL SOCIAL WORK PLACEMENT NOTE 02/04/2012  Patient:  [name], [name]  Account Number:  [id number] Admit date:  02/03/2012  Clinical Social Worker:  Doroteo Glassman  Date/time:  02/04/2012 03:56 PM  Clinical Social Work is seeking post-discharge placement for this patient at the following level of care:   SKILLED NURSING   (*CSW will update this form in Epic as items are completed)   02/04/2012  Patient/family provided with [facility] Department of Clinical Social Work's list of facilities offering this level of care within the geographic area requested by the patient (or if unable, by the patient's family).  02/04/2012  Patient/family informed of their freedom to choose among providers that offer the needed level of care, that participate in Medicare, Medicaid or managed care program needed by the patient, have an available bed and are willing to accept the patient.    Patient/family informed of [facility]' ownership interest in [facility], as well as of the fact that they are under no obligation to receive care at this facility.  PASARR submitted to EDS on existing   PASARR number received from EDS on existing  FL2 transmitted to all facilities in geographic area requested by pt/family on  02/04/2012 FL2 transmitted to all facilities within larger geographic area on   Patient informed that his/her managed care company has contracts with or will negotiate with  certain facilities, including the following:     Patient/family informed of bed offers received:   Patient chooses bed at  Physician recommends and patient chooses bed at    Patient to be transferred to  on   Patient to be transferred to facility by   The following physician request were entered in Epic:   Additional Comments:  Providence Crosby, Theresia Majors Clinical Social Work [phone]

## 2012-02-04 NOTE — Progress Notes (Signed)
PT Cancellation Note  Patient Details Name: [name] MRN: [id number] DOB: 03/22/1919   Cancelled Treatment:    Reason Eval/Treat Not Completed: Medical issues which prohibited therapy  Pt pending imaging. Order cancelled from yesterday and new order for  PT evaluation dated tomorrow received.   Oryn Casanova,[name] 02/04/2012, 1:28 PM Pager: [phone]

## 2012-02-04 NOTE — Consult Note (Signed)
WOC consult Note Reason for Consult: Consult requested for BLE.  Bedside nurse states pt had stage 1 to bilat heels upon admission.  She applied foam dressings and floated heels on pillow and they are no longer red.   Left heel with intact skin.  Right heel with dry peeling skin surrounding previous pressure ulcer which has healed and is now pink dry scar tissue.   Right plantar foot with chronic full thickness wound .2x.2X.1cm, currently now dry red woundbed without odor or drainage.  Has dry callous appearance. Another callous located on third toe anterior area; .3X.3cm dry yellow without odor or drainage.   Pt states she was been using silver wound gel prior to admission.    This is not available in the [facility] formulary.  Neosporin can be used to promote moist healing unitl pt is discharged and can resume previous plan of care. Will not plan to follow further unless re-consulted.  [address] RN, MSN, [employer]  [phone]

## 2012-02-04 NOTE — Progress Notes (Addendum)
[facility] PROGRESS NOTE  [name] AVW:[id number] DOB: 09-Mar-1919 DOA: 02/03/2012 PCP: [name], MD  Brief narrative 79 year old female with history of DM 2, hypothyroidism, chronic diastolic CHF, CKD with baseline creatinine 1.7, CAD, HTN was admitted on 02/03/12 with history of generalized weakness. Patient gives at least 2 week history of slurred speech, right-sided weakness, unable to right with right hand and dropping things from right hand. She had dysuria, urinary frequency and incontinence. She lives alone. Has chronic diarrhea. She was admitted for further evaluation and management   Assessment/Plan:  Dysarthria/right hemiparesis Initiated stroke workup. PT OT and ST evaluation. Bedside swallow screen by RN. Aspirin. MRI head does not show acute stroke. Unclear etiology- ? Parkinsons.  UTI Urine culture pending. Start IV Cipro.  Acute on chronic kidney disease Secondary to dehydration. Creatinine improving. Monitor daily BMP. Temporarily hold Lasix. Continue gentle IV fluids for additional 24 hours.  Hyperkalemia Likely secondary to acute renal failure. Resolved  Hypertension Reasonably controlled. Continue Cardizem and carvedilol. Monitor  Type 2 diabetes mellitus Will place on reduced dose of Lantus and sliding scale insulin until swallowing cleared by ST. Then consider mealtime NovoLog and adjusting Lantus based on CBGs.  Hypothyroidism Continue Synthroid.  Feet ulcers Wound care consulted. Likely secondary to diabetic neuropathy.  Chronic diastolic CHF/CAD  Compensated. Continue Imdur, ASA, Coreg    Code Status: Full  Family Communication: Discussed with patient and daughter Ms Annice Pih. Disposition Plan: To be determined-PT and OT eval   Consultants:  None  Procedures:  None  Antibiotics:  IV Cipro 1/10 >  HPI/Subjective: 2 week history of slurred speech, right-sided weakness with difficulty holding objects in right hand or  writing. Denies difficulty swallowing. Says feeling better.   Objective: Filed Vitals:   02/03/12 2309 02/03/12 2338 02/04/12 0942 02/04/12 1317  BP: 126/48 140/53 123/50 141/51  Pulse: 61 64  66  Temp: 97.8 F (36.6 C) 97.9 F (36.6 C)  98.6 F (37 C)  TempSrc: Oral Oral  Oral  Resp: 15 20  18   Height:  5\' 8"  (1.727 m)    Weight:  79.379 kg (175 lb)    SpO2: 96% 98%  98%    Intake/Output Summary (Last 24 hours) at 02/04/12 1914 Last data filed at 02/04/12 1525  Gross per 24 hour  Intake 2236.25 ml  Output      0 ml  Net 2236.25 ml   Filed Weights   02/03/12 2338  Weight: 79.379 kg (175 lb)    Exam:   General exam: Appears comfortable.  Respiratory system: Clear to auscultation. No increased work of breathing.  Cardiovascular system: First and second heart sounds heard, regular rate and rhythm. No JVD, murmurs or pedal edema. Telemetry shows sinus bradycardia in the 50s to sinus rhythm in the 60s.  Gastrointestinal system: Abdomen is nondistended, soft and nontender. Normal bowel sounds heard.  Central nervous system: Alert and oriented.? Subtle facial asymmetry. Dysarthria +. No other cranial nerve deficits.  Extremities: Right pronator drift and grade 4/5 power right upper extremity. Otherwise symmetrical 5 x 5 power. Peripheral pulses symmetrically felt. Dry small ulcer on the ball of the right great toe and dorsum of right fourth toe.  Data Reviewed: Basic Metabolic Panel:  Lab 02/04/12 8295 02/03/12 1840  NA 135 129*  K 4.6 5.5*  CL 100 93*  CO2 26 26  GLUCOSE 230* 105*  [name] 38* 44*  CREATININE 1.92* 2.16*  CALCIUM 8.5 9.3  MG -- --  PHOS -- --  Liver Function Tests: No results found for this basename: AST:5,ALT:5,ALKPHOS:5,BILITOT:5,PROT:5,ALBUMIN:5 in the last 168 hours No results found for this basename: LIPASE:5,AMYLASE:5 in the last 168 hours No results found for this basename: AMMONIA:5 in the last 168 hours CBC:  Lab 02/03/12 1840  WBC  13.8*  NEUTROABS 10.8*  HGB 12.0  HCT 36.6  MCV 88.2  PLT 285   Cardiac Enzymes:  Lab 02/03/12 1840  CKTOTAL --  CKMB --  CKMBINDEX --  TROPONINI <0.30   BNP (last 3 results)  Basename 04/27/11 0450 04/24/11 1106 04/23/11 0505  PROBNP 2796.0* 8616.0* 4651.0*   CBG:  Lab 02/04/12 1636 02/04/12 1152 02/04/12 0755 02/03/12 2154 02/03/12 1659  GLUCAP 152* 189* 229* 149* 103*    No results found for this or any previous visit (from the past 240 hour(s)).   Studies: Dg Chest 2 View  02/04/2012  *RADIOLOGY REPORT*  Clinical Data: Stroke  CHEST - 2 VIEW  Comparison: 12/26/2011  Findings: Mild vascular congestion is present but this has improved.  The heart is mildly enlarged.  Bibasilar atelectasis. No new consolidation or mass.  No Kerley B lines to suggest interstitial edema. Sclerotic changes in the proximal right humerus are stable.  IMPRESSION: Improving vascular congestion.   Original Report Authenticated By: Jolaine Click, M.D.    Ct Head Wo Contrast  02/03/2012  *RADIOLOGY REPORT*  Clinical Data: Slurred speech.  Unsteady gait.  Right-sided weakness.  CT HEAD WITHOUT CONTRAST  Technique:  Contiguous axial images were obtained from the base of the skull through the vertex without contrast.  Comparison: 02/21/2008  Findings: There is no evidence for acute infarction, intracranial hemorrhage, mass lesion, hydrocephalus, or extra-axial fluid. Moderate to severe atrophy is present.  Advanced chronic microvascular ischemic change is present in the periventricular and subcortical white matter.  Chronic hypodensity left insular cortex.  No CT signs of proximal vascular thrombosis in the left MCA territory.  No evidence for chronic subdural hematoma.  Calvarium intact.  Advanced carotid and vertebral atherosclerosis.  Clear sinuses and mastoids.  IMPRESSION: Stable exam.  Atrophy and chronic microvascular ischemic change without visible acute stroke or hemorrhage.   Original Report Authenticated  By: Davonna Belling, M.D.    Mr [facility] Wo Contrast  02/04/2012  *RADIOLOGY REPORT*  Clinical Data:  Diabetic hypertensive with chronic renal disease presenting with right-sided weakness and difficulty speaking.  MRI BRAIN WITHOUT CONTRAST MRA HEAD WITHOUT CONTRAST  Technique: Multiplanar, multiecho pulse sequences of the brain and surrounding structures were obtained according to standard protocol without intravenous contrast.  Angiographic images of the head were obtained using MRA technique without contrast.  Comparison: 02/03/2012 head CT.  No comparison brain MR.  MRI HEAD  Findings:  No acute infarct.  No intracranial hemorrhage.  Small vessel disease type changes.  Global atrophy.  Ventricular prominence felt to be related to atrophy rather hydrocephalus.  No intracranial mass lesion detected on this unenhanced exam.  Partial opacification inferior aspect left mastoid air cells.  No obstructing lesion posterior-superior nasopharynx.  Paranasal sinus mild mucosal thickening.  Cervical spondylotic changes with spinal stenosis most prominent C2- 3.  Transverse ligament hypertrophy.  Major intracranial vascular structures are patent.  Cervical medullary junction, pituitary region, pineal region and orbital structures unremarkable.  IMPRESSION: No acute infarct.  Please see above.  MRA HEAD  Findings: Mild irregularity of the cavernous segment of the internal carotid artery bilaterally.  Anterior circulation without medium or large size vessel significant stenosis or occlusion.  Middle cerebral  artery and A2 segment anterior cerebral artery mild irregularity bilaterally.  Left vertebral artery is dominant size.  Mild narrowing portions of the right vertebral artery.  Nonvisualization right PICA.  Mild irregularity of the basilar artery without high-grade stenosis.  Small left AICA.  Moderate narrowing proximal right and distal left superior cerebellar artery.  High-grade focal stenosis of the right posterior  cerebral artery P1- 2 junction.  Marked narrowing distal right posterior cerebral artery superior branch.  No aneurysm noted.  IMPRESSION: Intracranial atherosclerotic type changes most notable involving the right posterior cerebral artery as detailed above.   Original Report Authenticated By: Lacy Duverney, M.D.    Mr Brain Wo Contrast  02/04/2012  *RADIOLOGY REPORT*  Clinical Data:  Diabetic hypertensive with chronic renal disease presenting with right-sided weakness and difficulty speaking.  MRI BRAIN WITHOUT CONTRAST MRA HEAD WITHOUT CONTRAST  Technique: Multiplanar, multiecho pulse sequences of the brain and surrounding structures were obtained according to standard protocol without intravenous contrast.  Angiographic images of the head were obtained using MRA technique without contrast.  Comparison: 02/03/2012 head CT.  No comparison brain MR.  MRI HEAD  Findings:  No acute infarct.  No intracranial hemorrhage.  Small vessel disease type changes.  Global atrophy.  Ventricular prominence felt to be related to atrophy rather hydrocephalus.  No intracranial mass lesion detected on this unenhanced exam.  Partial opacification inferior aspect left mastoid air cells.  No obstructing lesion posterior-superior nasopharynx.  Paranasal sinus mild mucosal thickening.  Cervical spondylotic changes with spinal stenosis most prominent C2- 3.  Transverse ligament hypertrophy.  Major intracranial vascular structures are patent.  Cervical medullary junction, pituitary region, pineal region and orbital structures unremarkable.  IMPRESSION: No acute infarct.  Please see above.  MRA HEAD  Findings: Mild irregularity of the cavernous segment of the internal carotid artery bilaterally.  Anterior circulation without medium or large size vessel significant stenosis or occlusion.  Middle cerebral artery and A2 segment anterior cerebral artery mild irregularity bilaterally.  Left vertebral artery is dominant size.  Mild narrowing  portions of the right vertebral artery.  Nonvisualization right PICA.  Mild irregularity of the basilar artery without high-grade stenosis.  Small left AICA.  Moderate narrowing proximal right and distal left superior cerebellar artery.  High-grade focal stenosis of the right posterior cerebral artery P1- 2 junction.  Marked narrowing distal right posterior cerebral artery superior branch.  No aneurysm noted.  IMPRESSION: Intracranial atherosclerotic type changes most notable involving the right posterior cerebral artery as detailed above.   Original Report Authenticated By: Lacy Duverney, M.D.     Scheduled Meds:    . aspirin  300 mg Rectal Daily   Or  . aspirin  325 mg Oral Daily  . calcium carbonate  1 tablet Oral Daily  . carvedilol  6.25 mg Oral BID WC  . ciprofloxacin  400 mg Intravenous Q24H  . diltiazem  180 mg Oral QHS  . enoxaparin  30 mg Subcutaneous Q24H  . gabapentin  300 mg Oral BID  . insulin aspart  0-9 Units Subcutaneous TID WC  . insulin glargine  20 Units Subcutaneous QHS  . isosorbide mononitrate  30 mg Oral Daily  . levothyroxine  200 mcg Oral QAC breakfast  . levothyroxine  25 mcg Oral QAC breakfast  . multivitamin with minerals  1 tablet Oral Daily  . neomycin-bacitracin-polymyxin   Topical Daily  . pantoprazole  40 mg Oral Daily  . Tamsulosin HCl  0.4 mg Oral q morning - 10a  .  traZODone  50 mg Oral QHS   Continuous Infusions:    . sodium chloride 50 mL/hr at 02/04/12 1525    Principal Problem:  *CVA (cerebral infarction) Active Problems:  DIABETES MELLITUS, TYPE II, UNCONTROLLED  DIABETIC PERIPHERAL NEUROPATHY  HYPERTENSION, BENIGN ESSENTIAL  UTI (lower urinary tract infection)  Dehydration  Hypothyroidism  Hyponatremia  Renal failure (ARF), acute on chronic  Hyperkalemia    Time spent: 45 minutes    [facility]  [facility] Pager [phone]. If 8PM-8AM, please contact night-coverage at [email or web address], password [facility] 02/04/2012, 7:14  PM  LOS: 1 day

## 2012-02-04 NOTE — Care Management Note (Signed)
    Page 1 of 2   02/08/2012     3:50:45 PM   CARE MANAGEMENT NOTE 02/08/2012  Patient:  [name], [name]   Account Number:  [id number]  Date Initiated:  02/04/2012  Documentation initiated by:  Lanier Clam  Subjective/Objective Assessment:   ADMITTED W/GEN'L WEAKNESS.UTI,RENAL FAILURE.?STROKE     Action/Plan:   FROM HOME,ALONE.HAS PCP,PHARMACY.   Anticipated DC Date:  02/08/2012   Anticipated DC Plan:  IP REHAB FACILITY      DC Planning Services  CM consult      Choice offered to / List presented to:             Status of service:  Completed, signed off Medicare Important Message given?   (If response is "NO", the following Medicare IM given date fields will be blank) Date Medicare IM given:   Date Additional Medicare IM given:    Discharge Disposition:  IP REHAB FACILITY  Per UR Regulation:  Reviewed for med. necessity/level of care/duration of stay  If discussed at Long Length of Stay Meetings, dates discussed:    Comments:  02/08/12 Zain Lankford RN,BSN NCM [phone] PATIENT GOING TO RM#[id number],NURSE TO CALL REPORT TO #[phone],& [facility] #[phone] ONCE D/C SUMMARY,&D/C ORDER IN PLACE.MD AWARE OF D/C SUMMARY,& D/C ORDER.  ANNE-INPT REHAB COORDIN FOLLOWING.QUALIFIES FOR INPT REHAB,AWAITING A BED.RECEIVING MD-DR. KIRSTENS.COBRA FORM ON CHART.[facility] TRANSFER INFO IN [place].NURSE AWARE TO CALL [facility]-[phone], ONCE WE HAVE A RM#.WILL NEED D/C SUMMARY,& D/C ORDER.MD UPDATED.  02/07/12 Cam Harnden RN,BSN NCM [phone] PT/OT-CIR.CIR MD-INPT REHAB.INPT REHAB COORDIN FOLLOWING.MD UPDATED.  02/04/12 Walfred Bettendorf RN,BSN NCM [phone] STROKE UP,WILL AWAIT PT/OT RECOMMENDATIONS.

## 2012-02-04 NOTE — Progress Notes (Signed)
SLP Cancellation Note  Patient Details Name: HOLLEY KOCUREK MRN: [id number] DOB: Mar 03, 1919   Cancelled treatment:       Reason Eval/Treat Not Completed: Other (comment) (order for eval 02/05/12)   Donavan Burnet, MS [facility] SLP [phone]

## 2012-02-04 NOTE — Progress Notes (Signed)
Clinical Social Work Department BRIEF PSYCHOSOCIAL ASSESSMENT 02/04/2012  Patient:  [name], [name]     Account Number:  [id number]     Admit date:  02/03/2012  Clinical Social Worker:  Doroteo Glassman  Date/Time:  02/04/2012 03:47 PM  Referred by:  Physician  Date Referred:  02/04/2012 Referred for  SNF Placement   Other Referral:   Interview type:  Other - See comment Other interview type:   Pt's daughter, Mrs. Leary Roca    PSYCHOSOCIAL DATA Living Status:  ALONE Admitted from facility:   Level of care:   Primary support name:  Mrs. Mahoney Primary support relationship to patient:  CHILD, ADULT Degree of support available:   adequate    CURRENT CONCERNS Current Concerns  Post-Acute Placement   Other Concerns:    SOCIAL WORK ASSESSMENT / PLAN Unable to meet with Pt, as Pt having an MRI.    Spoke with Pt's daughter via phone.    Pt's daughter stated that she feels that it's becoming clear to Pt that the lady that stays with her mom from 9-1 daily is not sufficient.    Pt's daughter reported that Pt has been to [facility] numerous times and that this is the only facility that Pt will consider.  CSW answered questions re: ALF and private-caregiver costs.    Pt's daughter gave CSW permission to send Pt's information to [facility] for possible SNF placement.    CSW thanked Pt's daughter for her time.   Assessment/plan status:  Psychosocial Support/Ongoing Assessment of Needs Other assessment/ plan:   Information/referral to community resources:   declined...only wants [facility]    PATIENT'S/FAMILY'S RESPONSE TO PLAN OF CARE: Pt's daughter thanked CSW for time and assistance.   CSW to continue to follow.  Providence Crosby, LCSWA Clinical Social Work [phone]

## 2012-02-04 NOTE — Progress Notes (Signed)
  Echocardiogram 2D Echocardiogram has been performed.  Cathie Beams 02/04/2012, 4:05 PM

## 2012-02-05 ENCOUNTER — Inpatient Hospital Stay (HOSPITAL_COMMUNITY): Payer: Medicare Other

## 2012-02-05 DIAGNOSIS — E039 Hypothyroidism, unspecified: Secondary | ICD-10-CM

## 2012-02-05 DIAGNOSIS — E871 Hypo-osmolality and hyponatremia: Secondary | ICD-10-CM

## 2012-02-05 DIAGNOSIS — R5381 Other malaise: Secondary | ICD-10-CM

## 2012-02-05 DIAGNOSIS — E1149 Type 2 diabetes mellitus with other diabetic neurological complication: Secondary | ICD-10-CM

## 2012-02-05 LAB — CBC
HCT: 34 % — ABNORMAL LOW (ref 36.0–46.0)
Hemoglobin: 10.7 g/dL — ABNORMAL LOW (ref 12.0–15.0)
MCH: 28.2 pg (ref 26.0–34.0)
RBC: 3.79 MIL/uL — ABNORMAL LOW (ref 3.87–5.11)

## 2012-02-05 LAB — BASIC METABOLIC PANEL
CO2: 25 mEq/L (ref 19–32)
Glucose, Bld: 289 mg/dL — ABNORMAL HIGH (ref 70–99)
Potassium: 4.5 mEq/L (ref 3.5–5.1)
Sodium: 136 mEq/L (ref 135–145)

## 2012-02-05 LAB — GLUCOSE, CAPILLARY
Glucose-Capillary: 423 mg/dL — ABNORMAL HIGH (ref 70–99)
Glucose-Capillary: 251 mg/dL — ABNORMAL HIGH (ref 70–99)
Glucose-Capillary: 210 mg/dL — ABNORMAL HIGH (ref 70–99)

## 2012-02-05 LAB — URINE CULTURE

## 2012-02-05 LAB — CLOSTRIDIUM DIFFICILE BY PCR: Toxigenic C. Difficile by PCR: NEGATIVE

## 2012-02-05 MED ORDER — INSULIN ASPART 100 UNIT/ML ~~LOC~~ SOLN
0.0000 [IU] | Freq: Three times a day (TID) | SUBCUTANEOUS | Status: DC
  Administered 2012-02-05: 3 [IU] via SUBCUTANEOUS
  Administered 2012-02-05: 8 [IU] via SUBCUTANEOUS
  Administered 2012-02-06: 3 [IU] via SUBCUTANEOUS
  Administered 2012-02-06: 8 [IU] via SUBCUTANEOUS
  Administered 2012-02-07: 11 [IU] via SUBCUTANEOUS
  Administered 2012-02-07 (×2): 2 [IU] via SUBCUTANEOUS
  Administered 2012-02-08: 3 [IU] via SUBCUTANEOUS
  Administered 2012-02-08: 2 [IU] via SUBCUTANEOUS

## 2012-02-05 MED ORDER — INSULIN ASPART 100 UNIT/ML ~~LOC~~ SOLN
6.0000 [IU] | Freq: Once | SUBCUTANEOUS | Status: AC
  Administered 2012-02-05: 6 [IU] via SUBCUTANEOUS

## 2012-02-05 MED ORDER — INSULIN ASPART 100 UNIT/ML ~~LOC~~ SOLN
4.0000 [IU] | Freq: Three times a day (TID) | SUBCUTANEOUS | Status: DC
  Administered 2012-02-05 – 2012-02-08 (×9): 4 [IU] via SUBCUTANEOUS

## 2012-02-05 MED ORDER — INSULIN ASPART 100 UNIT/ML ~~LOC~~ SOLN
0.0000 [IU] | Freq: Every day | SUBCUTANEOUS | Status: DC
  Administered 2012-02-05: 2 [IU] via SUBCUTANEOUS
  Administered 2012-02-07: 3 [IU] via SUBCUTANEOUS

## 2012-02-05 NOTE — Progress Notes (Signed)
Pt having multiple episodes of loose stool throughout night. Pt has history of Chronic diarrhea and states she takes paragoric every night. On-call MD notified, orders to send C.Diff sample at this time.  Earnest Conroy. Clelia Croft, RN

## 2012-02-05 NOTE — Evaluation (Signed)
Physical Therapy Evaluation Patient Details Name: [name] MRN: [id number] DOB: [birth year] Today's Date: 02/05/2012 Time: 4098-1191 PT Time Calculation (min): 31 min  PT Assessment / Plan / Recommendation Clinical Impression  79 yo female admitted with generalized weakness and right sided  weakness she has noticed for a few weeks. She will need 24/7 care and follow up PT at D/C to impove safety and independence. She would benefit from CIR vs SNF    PT Assessment  Patient needs continued PT services    Follow Up Recommendations  CIR;SNF    Does the patient have the potential to tolerate intense rehabilitation      Barriers to Discharge Decreased caregiver support      Equipment Recommendations  None recommended by PT    Recommendations for Other Services OT consult   Frequency Min 4X/week    Precautions / Restrictions Precautions Precautions: Fall   Pertinent Vitals/Pain No c/o pain      Mobility  Bed Mobility Bed Mobility: Supine to Sit;Rolling Right;Rolling Left;Sit to Supine Rolling Right: 4: Min assist;With rail Rolling Left: 4: Min assist;With rail Supine to Sit: 1: +2 Total assist Supine to Sit: Patient Percentage: 60% Details for Bed Mobility Assistance: pt anxious to move, needs verbal cues for safety Transfers Transfers: Sit to Stand;Stand to Sit Sit to Stand: 4: Min assist Stand to Sit: 4: Min assist Details for Transfer Assistance: pt uses arms to push up on armrests ,  Pt transferred to bedside commode during treatment Ambulation/Gait Ambulation/Gait Assistance: 3: Mod assist Ambulation Distance (Feet): 25 Feet Assistive device: Rolling walker Ambulation/Gait Assistance Details: pt needs more assist with balance and safety as she continues to walk Gait Pattern: Step-through pattern;Decreased step length - right;Decreased step length - left Gait velocity: decreased General Gait Details: pt noted to have more diffculty with heel strike and swing  though as she fatigues Stairs: No Wheelchair Mobility Wheelchair Mobility: No    Shoulder Instructions     Exercises     PT Diagnosis: Difficulty walking;Generalized weakness;Hemiplegia dominant side  PT Problem List: Decreased strength;Decreased activity tolerance;Decreased mobility;Decreased safety awareness;Obesity PT Treatment Interventions: DME instruction;Gait training;Functional mobility training;Therapeutic activities;Therapeutic exercise;Balance training;Patient/family education   PT Goals Acute Rehab PT Goals PT Goal Formulation: With patient Time For Goal Achievement: 02/19/12 Potential to Achieve Goals: Good Pt will go Supine/Side to Sit: with min assist PT Goal: Supine/Side to Sit - Progress: Goal set today Pt will go Sit to Supine/Side: Independently;with min assist Pt will go Sit to Stand: Independently;with supervision PT Goal: Sit to Stand - Progress: Goal set today Pt will go Stand to Sit: with supervision PT Goal: Stand to Sit - Progress: Goal set today Pt will Ambulate: >150 feet;with supervision;with least restrictive assistive device PT Goal: Ambulate - Progress: Goal set today  Visit Information  Last PT Received On: 02/05/12 Assistance Needed: +2    Subjective Data  Subjective: I have a girl from 9-5:30 every day Patient Stated Goal: I want to walk!   Prior Functioning  Home Living Lives With: Alone (per patient, assist every am ) Available Help at Discharge: Available PRN/intermittently ("i have a girl from 9-5:30 every day") Home Layout: One level Home Adaptive Equipment: Walker - rolling Prior Function Level of Independence: Independent with assistive device(s) Communication Communication: Expressive difficulties (some dysarthria, but speech intelligible)    Cognition  Overall Cognitive Status: Appears within functional limits for tasks assessed/performed Arousal/Alertness: Awake/alert Orientation Level: Appears intact for tasks  assessed Behavior During Session: [facility]  for tasks performed    Extremity/Trunk Assessment Right Lower Extremity Assessment RLE ROM/Strength/Tone: Deficits RLE ROM/Strength/Tone Deficits: pt had increasing deficits as she moves her leg, with increasing difficulty in bringing leg through in gait.   RLE Sensation:  (difficulty to get full assessment of sensation from patient) RLE Coordination: Deficits RLE Coordination Deficits: increasing problems with coordination as pt uses leg Left Lower Extremity Assessment LLE ROM/Strength/Tone: [facility] for tasks assessed   Balance Balance Balance Assessed: Yes Static Sitting Balance Static Sitting - Balance Support: Bilateral upper extremity supported Static Sitting - Level of Assistance: 6: Modified independent (Device/Increase time) Static Standing Balance Static Standing - Balance Support: Bilateral upper extremity supported;During functional activity Static Standing - Level of Assistance: 5: Stand by assistance  End of Session PT - End of Session Equipment Utilized During Treatment: Gait belt Activity Tolerance: Patient limited by fatigue Patient left: in chair;with call bell/phone within reach;with chair alarm set Nurse Communication: Mobility status  GP     Bayard Hugger. [place], [place] [phone] 02/05/2012, 1:27 PM

## 2012-02-05 NOTE — Consult Note (Signed)
Reason for Consult: Dysarthria and right sided weakness Referring Physician: Dr Isidoro Donning  CC: Dysarthria and right sided weakness  HPI: [name] is a very pleasant  79 y.o. female who has been living alone and independently. She has a daughter who has POA and is very supportive. Sometime after Thanksgiving the patient noted some difficulties with her speech which mainly presented as slurring. This was also noted by her daughter and other family members. The symptoms appear to wax and wane throughout the day but are more apparent when the patient is tired. She also has some mild hoarseness and decreased volume recently while speaking.  The patient has generalized weakness which has been present for many years. She occasionally drops objects especially in the kitchen. Recently, again sometime after Thanksgiving, the patient noted some mild right sided weakness in addition to her generalized weakness. The patient's daughter noted that at times the patient would drank her right foot. The patient has a history of multiple falls which is been going on for years. She had recently been evaluated for one of these falls and was after that time that the patient's daughter noted that she was dragging her foot. She was recently seen in the emergency department at [facility] on 02/03/2012 14 generalized weakness. The patient's daughter noted that she also at times appears to to rule. It was felt that the patient had a urinary tract infection; although, she was admitted for further evaluation. A neurology consult has been requested for further evaluation of the dysarthria and the right-sided weakness.  Past Medical History  Diagnosis Date  . Myocardial infarction   . Coronary artery disease   . Hypertension   . Dysrhythmia   . Peripheral vascular disease   . Hypothyroidism   . Diabetes mellitus   . Presbyesophagus 04/23/2011  . Urinary retention with incomplete bladder emptying 04/23/2011  . UTI  (lower urinary tract infection) 04/21/2011  . Diastolic CHF, chronic     Grade I diastolic dysfunction on Echo 04/26/11  . Anaphylaxis 04/25/2011    Thought to be from Rocephin    Past Surgical History  Procedure Date  . Cholecystectomy   . Colonoscopy 01/15/2011    Procedure: COLONOSCOPY;  Surgeon: Theda Belfast, MD;  Location: [facility];  Service: Endoscopy;  Laterality: N/A;    No family history on file.  Social History:  reports that she quit smoking about 30 years ago. Her smoking use included Cigarettes. She has never used smokeless tobacco. She reports that she drinks alcohol. She reports that she does not use illicit drugs. The patient lives alone. She has a supportive daughter. She had a son who is now deceased. She had 2 husbands both of whom are deceased. She has an occasional alcoholic beverage on a social basis.  Allergies  Allergen Reactions  . Ephedrine   . Morphine Other (See Comments)    Difficulty waking up & talking after surgery  . Rocephin (Ceftriaxone Sodium In Dextrose)     Developed acute resp distress, wheezing after 2nd dose.    Medications: Current facility-administered medications:acetaminophen (TYLENOL) suppository 650 mg, 650 mg, Rectal, Q6H PRN, Lars Mage, MD;  acetaminophen (TYLENOL) tablet 650 mg, 650 mg, Oral, Q6H PRN, Lars Mage, MD;  aspirin suppository 300 mg, 300 mg, Rectal, Daily, Elease Etienne, MD, 300 mg at 02/04/12 1259;  aspirin tablet 325 mg, 325 mg, Oral, Daily, Elease Etienne, MD, 325 mg at 02/05/12 1010 calcium carbonate (OS-CAL - dosed in mg of  elemental calcium) tablet 500 mg of elemental calcium, 1 tablet, Oral, Daily, Lars Mage, MD, 500 mg of elemental calcium at 02/05/12 1008;  carvedilol (COREG) tablet 6.25 mg, 6.25 mg, Oral, BID WC, Ankit Garg, MD, 6.25 mg at 02/05/12 0900;  ciprofloxacin (CIPRO) IVPB 400 mg, 400 mg, Intravenous, Q24H, Elease Etienne, MD, 400 mg at 02/04/12 1633 diltiazem (CARDIZEM CD) 24 hr capsule 180 mg,  180 mg, Oral, QHS, Lars Mage, MD, 180 mg at 02/04/12 2210;  enoxaparin (LOVENOX) injection 30 mg, 30 mg, Subcutaneous, Q24H, Elease Etienne, MD, 30 mg at 02/04/12 2208;  gabapentin (NEURONTIN) capsule 300 mg, 300 mg, Oral, BID, Lars Mage, MD, 300 mg at 02/05/12 1009;  insulin aspart (novoLOG) injection 0-15 Units, 0-15 Units, Subcutaneous, TID WC, Ripudeep K Rai, MD, 8 Units at 02/05/12 1355 insulin aspart (novoLOG) injection 0-5 Units, 0-5 Units, Subcutaneous, QHS, Ripudeep K Rai, MD;  insulin aspart (novoLOG) injection 4 Units, 4 Units, Subcutaneous, TID WC, Ripudeep K Rai, MD, 4 Units at 02/05/12 1355;  insulin glargine (LANTUS) injection 20 Units, 20 Units, Subcutaneous, QHS, Elease Etienne, MD, 20 Units at 02/04/12 2209;  isosorbide mononitrate (IMDUR) 24 hr tablet 30 mg, 30 mg, Oral, Daily, Lars Mage, MD, 30 mg at 02/05/12 1010 levothyroxine (SYNTHROID, LEVOTHROID) tablet 200 mcg, 200 mcg, Oral, QAC breakfast, Gwen Her, PHARMD, 200 mcg at 02/05/12 0900;  levothyroxine (SYNTHROID, LEVOTHROID) tablet 25 mcg, 25 mcg, Oral, QAC breakfast, Elease Etienne, MD, 25 mcg at 02/05/12 0900;  multivitamin with minerals tablet 1 tablet, 1 tablet, Oral, Daily, Lars Mage, MD, 1 tablet at 02/05/12 1006 neomycin-bacitracin-polymyxin (NEOSPORIN) ointment, , Topical, Daily, Elease Etienne, MD;  ondansetron (ZOFRAN) injection 4 mg, 4 mg, Intravenous, Q6H PRN, Lars Mage, MD;  ondansetron (ZOFRAN) tablet 4 mg, 4 mg, Oral, Q6H PRN, Lars Mage, MD;  pantoprazole (PROTONIX) EC tablet 40 mg, 40 mg, Oral, Daily, Lars Mage, MD, 40 mg at 02/05/12 1010;  RESOURCE THICKENUP CLEAR, , Oral, PRN, Elease Etienne, MD senna-docusate (Senokot-S) tablet 1 tablet, 1 tablet, Oral, QHS PRN, Elease Etienne, MD;  Tamsulosin HCl (FLOMAX) capsule 0.4 mg, 0.4 mg, Oral, q morning - 10a, Lars Mage, MD, 0.4 mg at 02/05/12 1010;  traZODone (DESYREL) tablet 50 mg, 50 mg, Oral, QHS, Lars Mage, MD, 50 mg at 02/04/12  2208    ROS: History obtained from the patient and her daughter  General ROS: negative for - chills, fatigue, fever, night sweats, weight gain or weight loss. The patient has a long history of generalized weakness. She has occasional falls. She often drops objects. Psychological ROS: negative for - behavioral disorder, hallucinations, memory difficulties, mood swings or suicidal ideation Ophthalmic ROS: negative for - blurry vision, double vision, eye pain or loss of vision ENT ROS: negative for - epistaxis, nasal discharge, oral lesions, sore throat, tinnitus or vertigo Allergy and Immunology ROS: negative for - hives or itchy/watery eyes Hematological and Lymphatic ROS: negative for - bleeding problems, bruising or swollen lymph nodes Endocrine ROS: negative for - galactorrhea, hair pattern changes, polydipsia/polyuria or temperature intolerance Respiratory ROS: negative for - cough, hemoptysis, shortness of breath or wheezing Cardiovascular ROS: negative for - chest pain, dyspnea on exertion, edema or irregular heartbeat. She has chronic lower extremity edema bilaterally. Gastrointestinal ROS: negative for - abdominal pain, hematemesis, nausea/vomiting or stool incontinence. She has a long history of diarrhea which is been going on for many years. She's been evaluated by gastroenterologist. She recently had a colonoscopy. No etiology has been  found. Genito-Urinary ROS: negative for - dysuria, hematuria, incontinence or urinary frequency/urgency Musculoskeletal ROS: negative for - joint swelling or muscular weakness. Neurological ROS: as noted in HPI Dermatological ROS: negative for rash and skin lesion changes  Family History - both parents died from cardiac issues. She has a sister with Parkinson's disease  Physical Examination: Blood pressure 113/62, pulse 75, temperature 98.6 F (37 C), temperature source Axillary, resp. rate 18, height 5\' 8"  (1.727 m), weight 79.379 kg (175 lb), SpO2  94.00%.  General - Pleasant 79 yo f in NAD Heart - Regular rate and rhythm  Lungs - Clear to auscultation Extremities - Distal pulses weak to absent. There is 2+ pitting edema bilaterally. Skin - Warm and dry   Neurologic Examination Mental Status: Alert, oriented, thought content appropriate.  Speech fluent without evidence of aphasia.  Able to follow 3 step commands without difficulty. Cranial Nerves: II: Visual fields grossly normal, pupils equal, round, reactive to light and accommodation III,IV, VI: ptosis not present, extra-ocular motions intact bilaterally V,VII: smile symmetric, facial light touch sensation normal bilaterally VIII: hearing normal bilaterally IX,X: gag reflex present XI: bilateral shoulder shrug XII: midline tongue extension Motor: Right : Upper extremity   4/5    Left:     Upper extremity   5/5  Lower extremity   4+/5                          Lower extremity   5/5 Tone and bulk:normal tone throughout; no atrophy noted Sensory: Pinprick and light touch intact throughout, bilaterally Deep Tendon Reflexes: 2+ and symmetric throughout Plantars: Right: downgoing   Left: downgoing Cerebellar: Mild difficulty with finger to nose testing on the right. Mild difficulty with rapid alternating movements on the right and fine motor movements. Heel to [facility] testing normal bilaterally. Gait: Did not attempt to ambulate for safety reasons.   Laboratory Studies:   Basic Metabolic Panel:  Lab 02/05/12 6578 02/04/12 0540 02/03/12 1840  NA 136 135 129*  K 4.5 4.6 5.5*  CL 103 100 93*  CO2 25 26 26   GLUCOSE 289* 230* 105*  BUN 29* 38* 44*  CREATININE 1.69* 1.92* 2.16*  CALCIUM 8.7 8.5 9.3  MG -- -- --  PHOS -- -- --    Liver Function Tests: No results found for this basename: AST:5,ALT:5,ALKPHOS:5,BILITOT:5,PROT:5,ALBUMIN:5 in the last 168 hours No results found for this basename: LIPASE:5,AMYLASE:5 in the last 168 hours No results found for this basename:  AMMONIA:3 in the last 168 hours  CBC:  Lab 02/05/12 0500 02/03/12 1840  WBC 7.9 13.8*  NEUTROABS -- 10.8*  HGB 10.7* 12.0  HCT 34.0* 36.6  MCV 89.7 88.2  PLT 238 285    Cardiac Enzymes:  Lab 02/03/12 1840  CKTOTAL --  CKMB --  CKMBINDEX --  TROPONINI <0.30    BNP: No components found with this basename: POCBNP:5  CBG:  Lab 02/05/12 1135 02/05/12 0844 02/05/12 0530 02/05/12 0348 02/04/12 2153  GLUCAP 251* 174* 318* 423* 218*    Microbiology: Results for orders placed during the hospital encounter of 02/03/12  URINE CULTURE     Status: Normal   Collection Time   02/03/12  6:18 PM      Component Value Range Status Comment   Specimen Description URINE, CATHETERIZED   Final    Special Requests NONE   Final    Culture  Setup Time 02/04/2012 02:42   Final    Colony Count  NO GROWTH   Final    Culture NO GROWTH   Final    Report Status 02/05/2012 FINAL   Final   CLOSTRIDIUM DIFFICILE BY PCR     Status: Normal   Collection Time   02/05/12  4:05 AM      Component Value Range Status Comment   C difficile by pcr NEGATIVE  NEGATIVE Final     Coagulation Studies: No results found for this basename: LABPROT:5,INR:5 in the last 72 hours  Urinalysis:  Lab 02/03/12 1818  COLORURINE YELLOW  LABSPEC 1.014  PHURINE 5.0  GLUCOSEU NEGATIVE  HGBUR LARGE*  BILIRUBINUR NEGATIVE  KETONESUR NEGATIVE  PROTEINUR 100*  UROBILINOGEN 0.2  NITRITE NEGATIVE  LEUKOCYTESUR LARGE*    Lipid Panel:     Component Value Date/Time   CHOL 164 02/04/2012 0540   TRIG 150* 02/04/2012 0540   HDL 55 02/04/2012 0540   CHOLHDL 3.0 02/04/2012 0540   VLDL 30 02/04/2012 0540   LDLCALC 79 02/04/2012 0540    HgbA1C:  Lab Results  Component Value Date   HGBA1C 10.3* 02/05/2012    Urine Drug Screen:   No results found for this basename: labopia, cocainscrnur, labbenz, amphetmu, thcu, labbarb    Alcohol Level: No results found for this basename: ETH:2 in the last 168 hours  Other results: EKG:  normal NSR rate 63  Imaging: Dg Chest 2 View  02/04/2012  *RADIOLOGY REPORT*  Clinical Data: Stroke  CHEST - 2 VIEW  Comparison: 12/26/2011  Findings: Mild vascular congestion is present but this has improved.  The heart is mildly enlarged.  Bibasilar atelectasis. No new consolidation or mass.  No Kerley B lines to suggest interstitial edema. Sclerotic changes in the proximal right humerus are stable.  IMPRESSION: Improving vascular congestion.   Original Report Authenticated By: Jolaine Click, M.D.    Ct Head Wo Contrast  02/03/2012  *RADIOLOGY REPORT*  Clinical Data: Slurred speech.  Unsteady gait.  Right-sided weakness.  CT HEAD WITHOUT CONTRAST  Technique:  Contiguous axial images were obtained from the base of the skull through the vertex without contrast.  Comparison: 02/21/2008  Findings: There is no evidence for acute infarction, intracranial hemorrhage, mass lesion, hydrocephalus, or extra-axial fluid. Moderate to severe atrophy is present.  Advanced chronic microvascular ischemic change is present in the periventricular and subcortical white matter.  Chronic hypodensity left insular cortex.  No CT signs of proximal vascular thrombosis in the left MCA territory.  No evidence for chronic subdural hematoma.  Calvarium intact.  Advanced carotid and vertebral atherosclerosis.  Clear sinuses and mastoids.  IMPRESSION: Stable exam.  Atrophy and chronic microvascular ischemic change without visible acute stroke or hemorrhage.   Original Report Authenticated By: Davonna Belling, M.D.    Mr [facility] Wo Contrast  02/04/2012  *RADIOLOGY REPORT*  Clinical Data:  Diabetic hypertensive with chronic renal disease presenting with right-sided weakness and difficulty speaking.  MRI BRAIN WITHOUT CONTRAST MRA HEAD WITHOUT CONTRAST  Technique: Multiplanar, multiecho pulse sequences of the brain and surrounding structures were obtained according to standard protocol without intravenous contrast.  Angiographic images of the  head were obtained using MRA technique without contrast.  Comparison: 02/03/2012 head CT.  No comparison brain MR.  MRI HEAD  Findings:  No acute infarct.  No intracranial hemorrhage.  Small vessel disease type changes.  Global atrophy.  Ventricular prominence felt to be related to atrophy rather hydrocephalus.  No intracranial mass lesion detected on this unenhanced exam.  Partial opacification inferior aspect left  mastoid air cells.  No obstructing lesion posterior-superior nasopharynx.  Paranasal sinus mild mucosal thickening.  Cervical spondylotic changes with spinal stenosis most prominent C2- 3.  Transverse ligament hypertrophy.  Major intracranial vascular structures are patent.  Cervical medullary junction, pituitary region, pineal region and orbital structures unremarkable.  IMPRESSION: No acute infarct.  Please see above.  MRA HEAD  Findings: Mild irregularity of the cavernous segment of the internal carotid artery bilaterally.  Anterior circulation without medium or large size vessel significant stenosis or occlusion.  Middle cerebral artery and A2 segment anterior cerebral artery mild irregularity bilaterally.  Left vertebral artery is dominant size.  Mild narrowing portions of the right vertebral artery.  Nonvisualization right PICA.  Mild irregularity of the basilar artery without high-grade stenosis.  Small left AICA.  Moderate narrowing proximal right and distal left superior cerebellar artery.  High-grade focal stenosis of the right posterior cerebral artery P1- 2 junction.  Marked narrowing distal right posterior cerebral artery superior branch.  No aneurysm noted.  IMPRESSION: Intracranial atherosclerotic type changes most notable involving the right posterior cerebral artery as detailed above.   Original Report Authenticated By: Lacy Duverney, M.D.    Mr Brain Wo Contrast  02/04/2012  *RADIOLOGY REPORT*  Clinical Data:  Diabetic hypertensive with chronic renal disease presenting with  right-sided weakness and difficulty speaking.  MRI BRAIN WITHOUT CONTRAST MRA HEAD WITHOUT CONTRAST  Technique: Multiplanar, multiecho pulse sequences of the brain and surrounding structures were obtained according to standard protocol without intravenous contrast.  Angiographic images of the head were obtained using MRA technique without contrast.  Comparison: 02/03/2012 head CT.  No comparison brain MR.  MRI HEAD  Findings:  No acute infarct.  No intracranial hemorrhage.  Small vessel disease type changes.  Global atrophy.  Ventricular prominence felt to be related to atrophy rather hydrocephalus.  No intracranial mass lesion detected on this unenhanced exam.  Partial opacification inferior aspect left mastoid air cells.  No obstructing lesion posterior-superior nasopharynx.  Paranasal sinus mild mucosal thickening.  Cervical spondylotic changes with spinal stenosis most prominent C2- 3.  Transverse ligament hypertrophy.  Major intracranial vascular structures are patent.  Cervical medullary junction, pituitary region, pineal region and orbital structures unremarkable.  IMPRESSION: No acute infarct.  Please see above.  MRA HEAD  Findings: Mild irregularity of the cavernous segment of the internal carotid artery bilaterally.  Anterior circulation without medium or large size vessel significant stenosis or occlusion.  Middle cerebral artery and A2 segment anterior cerebral artery mild irregularity bilaterally.  Left vertebral artery is dominant size.  Mild narrowing portions of the right vertebral artery.  Nonvisualization right PICA.  Mild irregularity of the basilar artery without high-grade stenosis.  Small left AICA.  Moderate narrowing proximal right and distal left superior cerebellar artery.  High-grade focal stenosis of the right posterior cerebral artery P1- 2 junction.  Marked narrowing distal right posterior cerebral artery superior branch.  No aneurysm noted.  IMPRESSION: Intracranial atherosclerotic  type changes most notable involving the right posterior cerebral artery as detailed above.   Original Report Authenticated By: Lacy Duverney, M.D.    Dg Swallowing [facility] Pathology  02/05/2012  Vivi Ferns McCoy, CCC-SLP     02/05/2012  9:49 AM Objective Swallowing Evaluation: Modified Barium Swallowing Study   Patient Details  Name: AKYIA BORELLI MRN: [id number] Date of Birth: 17-Oct-1919  Today's Date: 02/05/2012 Time: 0900-0930 SLP Time Calculation (min): 30 min  Past Medical History:  Past Medical History  Diagnosis Date  .  Myocardial infarction   . Coronary artery disease   . Hypertension   . Dysrhythmia   . Peripheral vascular disease   . Hypothyroidism   . Diabetes mellitus   . Presbyesophagus 04/23/2011  . Urinary retention with incomplete bladder emptying 04/23/2011  . UTI (lower urinary tract infection) 04/21/2011  . Diastolic CHF, chronic     Grade I diastolic dysfunction on Echo 04/26/11  . Anaphylaxis 04/25/2011    Thought to be from Rocephin   Past Surgical History:  Past Surgical History  Procedure Date  . Cholecystectomy   . Colonoscopy 01/15/2011    Procedure: COLONOSCOPY;  Surgeon: Theda Belfast, MD;   Location: [facility];  Service: Endoscopy;  Laterality: N/A;   HPI:  79 yo female adm to [facility] with AMS, slurred speech, right sided  weakness.  Pt MRI 02/04/12 was negative for acute changes.  PMH +  for HTN, chronic renal disease, UTIs, DM, and pt had a fall in  November 2013.  Pt does have h/o dysphagia and has undergone MBS  in July 2011 and esophagram March 2013.   Pt admits to  "regurgitation" with  intake at times but daughter states this  has been ongoing for years .  Daughter also reports pt "coughs  all the time" and eats at rapid rate.  Daughter also states pt  will pocket food in the right side of her mouth and continue to  eat even after coughing episode. Pt and daughter deny pt with  weight loss or pulmonary infections.      Assessment / Plan / Recommendation Clinical Impression  Dysphagia  Diagnosis: Mild oral phase dysphagia;Moderate oral  phase dysphagia (combined esophageal dysphagia) Clinical impression: Patient presents with a mild-moderate  oropharyngeal dysphagia, likely exacerbated baseline dysphagia  given previous MBS report. Patient impulsive with po intake  during self-feeding, consuming large consecutive sips of liquids,  which when combined with mild weakness and intermittently delayed  swallow reflex, results in penetration and aspiration of thin  liquids. Aspiration however occurred in only one episode when  combined with pill, and penetrates were trace, clearing mostly  during the swallowing and consistently post swallow with  spontaneous dry swallow. Additionally, mild tongue base weakness  and decreased hyo-laryngeal elevation/excursion results in mild  pharyngeal residuals which additionally clear with dry swallow.  Patient does endorse regurgitation with po intake which SLP  suspects is related to h/o esophageal deficits, particularly in  light of today's esophageal sweep revealing what appeared to be  solid food stasis in mid esophagus post swallow. Therapeutic  intervention,  in addition to providing verbal and tactile cueing  for small single sips and intermittent dry swallow, included use  of liquid wash which appeared effective at facilitating  esophageal clearance. Recommend a dysphagia 3 (mechanical soft)  diet with thin liquids at this time, utilizing the above  precautions to decrease risk of aspiration which remains  moderate.     Treatment Recommendation  Therapy as outlined in treatment plan below    Diet Recommendation Dysphagia 3 (Mechanical Soft);Thin liquid   Liquid Administration via: Cup;Straw Medication Administration: Whole meds with puree Supervision: Patient able to self feed;Full supervision/cueing  for compensatory strategies Compensations: Slow rate;Small sips/bites;Check for  pocketing;Multiple dry swallows after each bite/sip;Follow solids  with liquid  Postural Changes and/or Swallow Maneuvers: Seated upright 90  degrees;Upright 30-60 min after meal    Other  Recommendations Oral Care Recommendations: Oral care BID   Follow Up Recommendations  Home health SLP;Skilled  Nursing facility    Frequency and Duration min 2x/week  2 weeks       SLP Swallow Goals Patient will utilize recommended strategies during swallow to  increase swallowing safety with: Total assistance   General Date of Onset: 02/04/12 HPI: 79 yo female adm to [facility] with AMS, slurred speech, right  sided weakness.  Pt MRI 02/04/12 was negative for acute changes.   PMH + for HTN, chronic renal disease, UTIs, DM, and pt had a fall  in November 2013.  Pt does have h/o dysphagia and has undergone  MBS in July 2011 and esophagram March 2013.   Pt admits to  "regurgitation" with  intake at times but daughter states this  has been ongoing for years .  Daughter also reports pt "coughs  all the time" and eats at rapid rate.  Daughter also states pt  will pocket food in the right side of her mouth and continue to  eat even after coughing episode. Pt and daughter deny pt with  weight loss or pulmonary infections.  Type of Study: Modified Barium Swallowing Study Reason for Referral: Objectively evaluate swallowing function Previous Swallow Assessment: Bedside swallow evaluation complete  1/10 indicated s/s of aspiration and need for MBS to evaluate  swallow objectively. Previous MBS complete 08/19/09 recommended  regular diet, thin liquids and esophageal w/u.  Esophagram 03/2011  mild presybesophagus, tertiary contractions, no hiatal hernia   Diet Prior to this Study: Dysphagia 3 (soft);Nectar-thick liquids Temperature Spikes Noted: No Respiratory Status: Room air History of Recent Intubation: No Behavior/Cognition: Requires cueing;Alert;Cooperative;Pleasant  mood Oral Cavity - Dentition: Dentures, bottom;Dentures, top Oral Motor / Sensory Function: Impaired - see Bedside swallow  eval Self-Feeding Abilities: Able to  feed self;Needs assist Patient Positioning: Upright in chair Baseline Vocal Quality: Clear Volitional Cough: Strong Volitional Swallow: Able to elicit Anatomy: Other (Comment) (? prominent CP and possible CP bar) Pharyngeal Secretions: Not observed secondary MBS    Reason for Referral Objectively evaluate swallowing function   Oral Phase Oral Preparation/Oral Phase Oral Phase: Impaired Oral - Nectar Oral - Nectar Cup: Lingual/palatal residue Oral - Thin Oral - Thin Cup: Lingual/palatal residue Oral - Thin Straw: Lingual/palatal residue Oral - Solids Oral - Puree: Lingual/palatal residue Oral - Mechanical Soft: Lingual/palatal residue Oral - Pill: Delayed oral transit (provided with thin liquids)   Pharyngeal Phase Pharyngeal Phase Pharyngeal Phase: Impaired Pharyngeal - Nectar Pharyngeal - Nectar Cup: Reduced tongue base retraction;Reduced  laryngeal elevation;Reduced anterior laryngeal  mobility;Pharyngeal residue - valleculae;Pharyngeal residue -  pyriform sinuses Pharyngeal - Thin Pharyngeal - Thin Cup: Reduced tongue base retraction;Reduced  laryngeal elevation;Reduced anterior laryngeal  mobility;Pharyngeal residue - valleculae;Pharyngeal residue -  pyriform sinuses;Penetration/Aspiration during swallow;Reduced  airway/laryngeal closure Penetration/Aspiration details (thin cup): Material enters  airway, remains ABOVE vocal cords and not ejected out (ejected  however with spontaneous dry swallow) Pharyngeal - Thin Straw: Reduced tongue base retraction;Reduced  laryngeal elevation;Reduced anterior laryngeal  mobility;Pharyngeal residue - valleculae;Pharyngeal residue -  pyriform sinuses;Penetration/Aspiration after swallow (aspiration  occurring when combined with pill only) Penetration/Aspiration details (thin straw): Material enters  airway, passes BELOW cords and not ejected out despite cough  attempt by patient Pharyngeal - Solids Pharyngeal - Puree: Reduced tongue base retraction;Reduced  laryngeal  elevation;Reduced anterior laryngeal  mobility;Pharyngeal residue - valleculae Pharyngeal - Mechanical Soft: Reduced tongue base  retraction;Reduced laryngeal elevation;Reduced anterior laryngeal  mobility;Pharyngeal residue - valleculae Pharyngeal - Pill: Within functional limits (although thin  liquids aspirated )  Cervical Esophageal Phase  GO    Cervical Esophageal Phase Cervical Esophageal Phase: Impaired Cervical Esophageal Phase - Nectar Nectar Cup: Prominent cricopharyngeal segment Cervical Esophageal Phase - Thin Thin Cup: Prominent cricopharyngeal segment Thin Straw: Prominent cricopharyngeal segment Cervical Esophageal Phase - Solids Puree: Prominent cricopharyngeal segment Mechanical Soft: Prominent cricopharyngeal segment Pill: Prominent cricopharyngeal segment Cervical Esophageal Phase - Comment Cervical Esophageal Comment: ? of CP bar also noted however  difficult to view and did not impede clearance of bolus through  upper esophagus. Esophageal sweep did reveal what appeared to be  solid food stais post swallow, clearing with clinician  facilitated liquid wash.         Ferdinand Lango MA, CCC-SLP [phone]  McCoy Leah Meryl 02/05/2012, 9:45 AM       Assessment/Plan:   Mild right sided weakness present approximately 2 months without progression. Generalized weakness for many years.  Mild dysarthria which appears to wax and wane. Dysarthria is worse when the patient is tired.  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  Multiple medical problems as listed above; including diabetes, hypertension, and peripheral vascular disease.  Aspirin 81 mg daily prior to admission. The patient is now on aspirin 325 mg daily.  UTI  Chronic diarrhea. Cdiff study pending.  Plan: MRI of the C-spine without contrast to further evaluate the patient's symptoms.  Delton See, PA-C [facility]  I personally participated this patient's evaluation and  management and approved the above clinical assessment and management recommendations.  Venetia Maxon M.D. [facility] [phone]

## 2012-02-05 NOTE — Evaluation (Signed)
Speech Language Pathology Evaluation Patient Details Name: [name] MRN: [id number] DOB: 1919/10/10 Today's Date: 02/05/2012 Time: 0900-0930 SLP Time Calculation (min): 30 min  Problem List:  Patient Active Problem List  Diagnosis  . DIABETES MELLITUS, TYPE II, UNCONTROLLED  . DIABETIC PERIPHERAL NEUROPATHY  . HYPERTENSION, BENIGN ESSENTIAL  . UTI (lower urinary tract infection)  . Dehydration  . Hypothyroidism  . Hyponatremia  . Constipation  . CVA (cerebral infarction)  . Renal failure (ARF), acute on chronic  . Hyperkalemia   Past Medical History:  Past Medical History  Diagnosis Date  . Myocardial infarction   . Coronary artery disease   . Hypertension   . Dysrhythmia   . Peripheral vascular disease   . Hypothyroidism   . Diabetes mellitus   . Presbyesophagus 04/23/2011  . Urinary retention with incomplete bladder emptying 04/23/2011  . UTI (lower urinary tract infection) 04/21/2011  . Diastolic CHF, chronic     Grade I diastolic dysfunction on Echo 04/26/11  . Anaphylaxis 04/25/2011    Thought to be from Rocephin   Past Surgical History:  Past Surgical History  Procedure Date  . Cholecystectomy   . Colonoscopy 01/15/2011    Procedure: COLONOSCOPY;  Surgeon: Theda Belfast, MD;  Location: [facility];  Service: Endoscopy;  Laterality: N/A;   HPI:  79 yo female adm to [facility] with AMS, slurred speech, right sided weakness.  Pt MRI 02/04/12 was negative for acute changes.  PMH + for HTN, chronic renal disease, UTIs, DM, and pt had a fall in November 2013.  Pt does have h/o dysphagia and has undergone MBS in July 2011 and esophagram March 2013.   Pt admits to "regurgitation" with  intake at times but daughter states this has been ongoing for years .  Daughter also reports pt "coughs all the time" and eats at rapid rate.  Daughter also states pt will pocket food in the right side of her mouth and continue to eat even after coughing episode. Pt and daughter deny pt with  weight loss or pulmonary infections.    Assessment / Plan / Recommendation Clinical Impression  Cogntiive-linguistic evaluation complete. Cognitive status suspected to be at baseline with mild impulsivity and decreased short term recall of new information however overall, appearing functional for return home. Patient does however present with continued moderate-severe dysarthria effecting speech intelligibility. Unknown origin at this time of right sided weakness (improving) as MRI negative. Discussed with MD who plans to consult neurology. Therapuetic intervention at bedside included moderate verbal and visual cueing for use of overarticulation, increased volume, and slowed rate which minimally improved intelligibility at the phrase level. Patient will benefit from skilled SLP intervention in the acute care setting with focus on speech intelligibility in order to maximize functional communication.     SLP Assessment  Patient needs continued Speech Lanaguage Pathology Services    Follow Up Recommendations  Home health SLP;Skilled Nursing facility    Frequency and Duration min 2x/week  2 weeks   Pertinent Vitals/Pain n/a   SLP Goals  SLP Goals Potential to Achieve Goals: Fair Potential Considerations:  (? origin of difficulty) Progress/Goals/Alternative treatment plan discussed with pt/caregiver and they: Agree SLP Goal #1: Patient will utilize speech intelligibility strategies at the conversation level with moderate SLP.  SLP Goal #1 - Progress: Not met  SLP Evaluation Prior Functioning  Cognitive/Linguistic Baseline:  (unknown) Type of Home: House Lives With: Alone (per patient, assist every am )   Cognition  Overall Cognitive Status: Appears  within functional limits for tasks assessed (suspect at baseline) Arousal/Alertness: Awake/alert Orientation Level: Oriented X4 Attention: Focused;Sustained Focused Attention: Appears intact Sustained Attention: Appears intact Memory:  Impaired Memory Impairment: Decreased recall of new information;Decreased short term memory (suspect baseline) Decreased Short Term Memory: Verbal complex;Functional complex Awareness: Appears intact Problem Solving: Appears intact (for basic ADLs) Behaviors: Impulsive (with self feeding) Safety/Judgment: Appears intact    Comprehension  Auditory Comprehension Overall Auditory Comprehension: Appears within functional limits for tasks assessed Visual Recognition/Discrimination Discrimination: Within Function Limits Reading Comprehension Reading Status: Impaired (appears related to visual deficits)    Expression Expression Primary Mode of Expression: Verbal Verbal Expression Overall Verbal Expression: Appears within functional limits for tasks assessed   Oral / Motor Oral Motor/Sensory Function Overall Oral Motor/Sensory Function: Impaired Labial ROM: Within Functional Limits Labial Symmetry: Within Functional Limits Labial Strength: Reduced Labial Sensation: Within Functional Limits Lingual ROM: Within Functional Limits Lingual Symmetry:  (mild deviation to the right initially on exam) Lingual Strength: Reduced Lingual Sensation: Within Functional Limits Facial ROM: Reduced right Facial Symmetry: Right droop (slight) Facial Strength: Reduced Facial Sensation: Within Functional Limits Velum: Within Functional Limits Mandible: Within Functional Limits Motor Speech Overall Motor Speech: Impaired Respiration: Impaired Level of Impairment: Phrase Phonation: Breathy (occassionally) Resonance: Within functional limits Articulation: Impaired Level of Impairment: Word Intelligibility: Intelligibility reduced Word: 50-74% accurate Phrase: 50-74% accurate Sentence: 50-74% accurate Conversation: 50-74% accurate Motor Planning: Witnin functional limits Motor Speech Errors: Not applicable Effective Techniques: Slow rate;Increased vocal intensity;Over-articulate   GO    Ferdinand Lango MA, CCC-SLP [phone]  Ferdinand Lango Meryl 02/05/2012, 9:45 AM

## 2012-02-05 NOTE — Evaluation (Signed)
Occupational Therapy Evaluation Patient Details Name: [name] MRN: [id number] DOB: 1919-11-19 Today's Date: 02/05/2012 Time: 5366-4403 OT Time Calculation (min): 41 min  OT Assessment / Plan / Recommendation Clinical Impression  Pt presents with RUE/RLE weakness and incoordination, dysarthria. MRI negative for acute stroke. Pt lives alone and was very independent prior to hospitalization per daughter. Skilled OT indicated to maximize independence with BADLs to supervision/setup/min A level in prep for d/c to next venue of care.    OT Assessment  Patient needs continued OT Services    Follow Up Recommendations  CIR;SNF    Barriers to Discharge Decreased caregiver support    Equipment Recommendations  3 in 1 bedside comode    Recommendations for Other Services Rehab consult  Frequency  Min 2X/week    Precautions / Restrictions Precautions Precautions: Fall   Pertinent Vitals/Pain Pt denied pain    ADL  Grooming: Denture care;Brushing hair;Minimal assistance Where Assessed - Grooming: Unsupported sitting Upper Body Bathing: Minimal assistance Where Assessed - Upper Body Bathing: Unsupported sitting Lower Body Bathing: Maximal assistance Where Assessed - Lower Body Bathing: Supported sit to stand Upper Body Dressing: Minimal assistance Where Assessed - Upper Body Dressing: Unsupported sitting Lower Body Dressing: Maximal assistance Where Assessed - Lower Body Dressing: Supported sit to Pharmacist, hospital: Moderate assistance Toilet Transfer Method: Stand pivot Toilet Transfer Equipment: Bedside commode Toileting - Clothing Manipulation and Hygiene: Moderate assistance Where Assessed - Toileting Clothing Manipulation and Hygiene: Sit to stand from 3-in-1 or toilet Equipment Used: Rolling walker ADL Comments: Pt completed SPT from chair>bsc>chair. MAX cues for safety and hand placement. Pt has tendency to abandon RW before reaching destination. Pt required A to wipe  back peri area following toileting and for thoroughness when cleaning dentures.    OT Diagnosis: Generalized weakness  OT Problem List: Decreased strength;Decreased coordination;Decreased safety awareness;Decreased activity tolerance;Decreased knowledge of use of DME or AE;Impaired UE functional use OT Treatment Interventions: Self-care/ADL training;Therapeutic activities;DME and/or AE instruction;Patient/family education;Balance training   OT Goals Acute Rehab OT Goals OT Goal Formulation: With patient/family Time For Goal Achievement: 02/19/12 Potential to Achieve Goals: Good ADL Goals Pt Will Perform Grooming: with supervision;Standing at sink ADL Goal: Grooming - Progress: Goal set today Pt Will Transfer to Toilet: with supervision;Ambulation;with DME;Stand pivot transfer ADL Goal: Toilet Transfer - Progress: Goal set today Pt Will Perform Toileting - Clothing Manipulation: with supervision;Sitting on 3-in-1 or toilet;Standing ADL Goal: Toileting - Clothing Manipulation - Progress: Goal set today Pt Will Perform Toileting - Hygiene: with supervision;Sit to stand from 3-in-1/toilet ADL Goal: Toileting - Hygiene - Progress: Goal set today Additional ADL Goal #1: Pt will complete all aspects of bathing and dressing at supervision/min A level. ADL Goal: Additional Goal #1 - Progress: Goal set today  Visit Information  Last OT Received On: 02/05/12 Assistance Needed: +2 (for ambulation)    Subjective Data  Subjective: I just recently left [place].. Patient Stated Goal: To get stronger.   Prior Functioning     Home Living Lives With: Alone Available Help at Discharge: Personal care attendant (from 9-530 daily.) Type of Home: House Home Layout: One level Home Adaptive Equipment: Walker - rolling Prior Function Level of Independence: Independent with assistive device(s) Able to Take Stairs?: Yes Driving: No Vocation: Retired Musician: Expressive  difficulties         Vision/Perception Vision - Assessment Eye Alignment: Within Functional Limits Vision Assessment: Vision tested Tracking/Visual Pursuits: Decreased smoothness of eye movement to RIGHT inferior field;Decreased smoothness of  eye movement to RIGHT superior field Saccades: Additional head turns occurred during testing Diplopia Assessment: Other (comment) Additional Comments: No diplopia noted by this therapist. Pt states it occurs after reading. Perception Perception: Within Functional Limits Praxis Praxis: Intact   Cognition  Overall Cognitive Status: Appears within functional limits for tasks assessed/performed Arousal/Alertness: Awake/alert Orientation Level: Appears intact for tasks assessed Behavior During Session: [facility] for tasks performed    Extremity/Trunk Assessment Right Upper Extremity Assessment RUE ROM/Strength/Tone: Deficits RUE ROM/Strength/Tone Deficits: Demo's only about 90* shoulder flex/abd. Strength at shoulder 3+/5. Pt reports an old shoulder fx about 73yrs ago. WFL at elbow and distally. RUE Sensation: WFL - Light Touch;WFL - Proprioception RUE Coordination: WFL - gross motor;Deficits RUE Coordination Deficits: Pt frequently drops small objects such as toothbrush and toothpaste. Pt was also unable to successfully complete thumb to finger opposition. Left Upper Extremity Assessment LUE ROM/Strength/Tone: WFL for tasks assessed      Mobility  Transfers Sit to Stand: 4: Min assist;With upper extremity assist;With armrests;From chair/3-in-1 Stand to Sit: 4: Min assist;With upper extremity assist;1: +2 Total assist Details for Transfer Assistance: Mod cues for hand placement and safety.     Shoulder Instructions     Exercise     Balance Balance Balance Assessed: Yes Static Sitting Balance Static Sitting - Balance Support: No upper extremity supported;Feet supported Static Sitting - Level of Assistance: 5: Stand by assistance Static  Standing Balance Static Standing - Balance Support: Bilateral upper extremity supported;During functional activity Static Standing - Level of Assistance: 5: Stand by assistance   End of Session OT - End of Session Activity Tolerance: Patient tolerated treatment well Patient left: in chair;with call bell/phone within reach;with family/visitor present;with chair alarm set  GO     Tyonna Talerico A OTR/L [phone] 02/05/2012, 3:13 PM

## 2012-02-05 NOTE — Progress Notes (Signed)
Patient ID: [name]  female  ZOX:[id number]    DOB: [birth year]    DOA: 02/03/2012  PCP: [name], MD  Assessment/Plan: Principal Problem: Dysarthria/right hemiparesis  - Discussed with patient's daughter, Ms Annice Pih, patient's dysarthria started after Christmas suddenly. She was not brought to the hospital at that time. On examination, right hemiparesis appears to have somewhat improved (? Baseline vs on admission). Per daughter, patient does not have a prior history of stroke. - Will do PT OT evaluation today to assess the weakness. - Swallow evaluation done today advance diet to dysphagia 3 with a liquids - MRI head does not show acute stroke. Unclear etiology- ? Parkinsons. Continue aspirin. - I discussed with neurology, Dr. Roseanne Reno, who recommended continuing aspirin as there is no clear indication of stroke on the MRI and patient could have dysarthria due to overall generalized weakness in UTI.  Active problems: UTI  - Urine culture showed no growth on Cipro.   Acute on chronic kidney disease  Secondary to dehydration. Creatinine improving. Monitor daily BMP. Temporarily hold Lasix. Continue gentle IV fluids for additional 24 hours.  - Continue to hold  Hyperkalemia  Likely secondary to acute renal failure. Resolved   Hypertension  Reasonably controlled. Continue Cardizem and carvedilol. Monitor   Type 2 diabetes mellitus: Uncontrolled  - Continue Lantus 20 units, placed on meal coverage, change sliding scale to moderate  Hypothyroidism  Continue Synthroid.   Feet ulcers  Wound care consulted. Likely secondary to diabetic neuropathy.   Chronic diastolic CHF/CAD  Compensated. Continue Imdur, ASA, Coreg. Hold Lasix until the renal function is stabilized  DVT Prophylaxis: lovenox  Code Status:  Disposition:    Subjective: Significant dysarthria, right-sided weakness improving  Objective: Weight change:   Intake/Output Summary (Last 24 hours) at  02/05/12 0947 Last data filed at 02/05/12 0604  Gross per 24 hour  Intake 2066.25 ml  Output      0 ml  Net 2066.25 ml   Blood pressure 135/50, pulse 65, temperature 98.2 F (36.8 C), temperature source Oral, resp. rate 16, height 5\' 8"  (1.727 m), weight 79.379 kg (175 lb), SpO2 91.00%.  Physical Exam: General: Alert and awake, oriented x3, not in any acute distress, dysarthria HEENT: anicteric sclera, pupils reactive to light and accommodation, EOMI CVS: S1-S2 clear, no murmur rubs or gallops Chest: clear to auscultation bilaterally, no wheezing, rales or rhonchi Abdomen: soft nontender, nondistended, normal bowel sounds, no organomegaly Extremities: no cyanosis, clubbing or edema noted bilaterally Neuro: Dysarthria, strength 5/5 bilaterally upper and lower extremities  Lab Results: Basic Metabolic Panel:  Lab 02/05/12 8119 02/04/12 0540  NA 136 135  K 4.5 4.6  CL 103 100  CO2 25 26  GLUCOSE 289* 230*  BUN 29* 38*  CREATININE 1.69* 1.92*  CALCIUM 8.7 8.5  MG -- --  PHOS -- --   Liver Function Tests: No results found for this basename: AST:2,ALT:2,ALKPHOS:2,BILITOT:2,PROT:2,ALBUMIN:2 in the last 168 hours No results found for this basename: LIPASE:2,AMYLASE:2 in the last 168 hours No results found for this basename: AMMONIA:2 in the last 168 hours CBC:  Lab 02/05/12 0500 02/03/12 1840  WBC 7.9 13.8*  NEUTROABS -- 10.8*  HGB 10.7* 12.0  HCT 34.0* 36.6  MCV 89.7 88.2  PLT 238 285   Cardiac Enzymes:  Lab 02/03/12 1840  CKTOTAL --  CKMB --  CKMBINDEX --  TROPONINI <0.30   BNP: No components found with this basename: POCBNP:2 CBG:  Lab 02/05/12 0844 02/05/12 0530 02/05/12 0348  02/04/12 2153 02/04/12 1636  GLUCAP 174* 318* 423* 218* 152*     Micro Results: Recent Results (from the past 240 hour(s))  URINE CULTURE     Status: Normal   Collection Time   02/03/12  6:18 PM      Component Value Range Status Comment   Specimen Description URINE, CATHETERIZED    Final    Special Requests NONE   Final    Culture  Setup Time 02/04/2012 02:42   Final    Colony Count NO GROWTH   Final    Culture NO GROWTH   Final    Report Status 02/05/2012 FINAL   Final     Studies/Results: Dg Chest 2 View  02/04/2012  *RADIOLOGY REPORT*  Clinical Data: Stroke  CHEST - 2 VIEW  Comparison: 12/26/2011  Findings: Mild vascular congestion is present but this has improved.  The heart is mildly enlarged.  Bibasilar atelectasis. No new consolidation or mass.  No Kerley B lines to suggest interstitial edema. Sclerotic changes in the proximal right humerus are stable.  IMPRESSION: Improving vascular congestion.   Original Report Authenticated By: Jolaine Click, M.D.    Ct Head Wo Contrast  02/03/2012  *RADIOLOGY REPORT*  Clinical Data: Slurred speech.  Unsteady gait.  Right-sided weakness.  CT HEAD WITHOUT CONTRAST  Technique:  Contiguous axial images were obtained from the base of the skull through the vertex without contrast.  Comparison: 02/21/2008  Findings: There is no evidence for acute infarction, intracranial hemorrhage, mass lesion, hydrocephalus, or extra-axial fluid. Moderate to severe atrophy is present.  Advanced chronic microvascular ischemic change is present in the periventricular and subcortical white matter.  Chronic hypodensity left insular cortex.  No CT signs of proximal vascular thrombosis in the left MCA territory.  No evidence for chronic subdural hematoma.  Calvarium intact.  Advanced carotid and vertebral atherosclerosis.  Clear sinuses and mastoids.  IMPRESSION: Stable exam.  Atrophy and chronic microvascular ischemic change without visible acute stroke or hemorrhage.   Original Report Authenticated By: Davonna Belling, M.D.    Mr [facility] Wo Contrast  02/04/2012  *RADIOLOGY REPORT*  Clinical Data:  Diabetic hypertensive with chronic renal disease presenting with right-sided weakness and difficulty speaking.  MRI BRAIN WITHOUT CONTRAST MRA HEAD WITHOUT CONTRAST   Technique: Multiplanar, multiecho pulse sequences of the brain and surrounding structures were obtained according to standard protocol without intravenous contrast.  Angiographic images of the head were obtained using MRA technique without contrast.  Comparison: 02/03/2012 head CT.  No comparison brain MR.  MRI HEAD  Findings:  No acute infarct.  No intracranial hemorrhage.  Small vessel disease type changes.  Global atrophy.  Ventricular prominence felt to be related to atrophy rather hydrocephalus.  No intracranial mass lesion detected on this unenhanced exam.  Partial opacification inferior aspect left mastoid air cells.  No obstructing lesion posterior-superior nasopharynx.  Paranasal sinus mild mucosal thickening.  Cervical spondylotic changes with spinal stenosis most prominent C2- 3.  Transverse ligament hypertrophy.  Major intracranial vascular structures are patent.  Cervical medullary junction, pituitary region, pineal region and orbital structures unremarkable.  IMPRESSION: No acute infarct.  Please see above.  MRA HEAD  Findings: Mild irregularity of the cavernous segment of the internal carotid artery bilaterally.  Anterior circulation without medium or large size vessel significant stenosis or occlusion.  Middle cerebral artery and A2 segment anterior cerebral artery mild irregularity bilaterally.  Left vertebral artery is dominant size.  Mild narrowing portions of the right vertebral artery.  Nonvisualization right PICA.  Mild irregularity of the basilar artery without high-grade stenosis.  Small left AICA.  Moderate narrowing proximal right and distal left superior cerebellar artery.  High-grade focal stenosis of the right posterior cerebral artery P1- 2 junction.  Marked narrowing distal right posterior cerebral artery superior branch.  No aneurysm noted.  IMPRESSION: Intracranial atherosclerotic type changes most notable involving the right posterior cerebral artery as detailed above.   Original  Report Authenticated By: Lacy Duverney, M.D.    Mr Brain Wo Contrast  02/04/2012  *RADIOLOGY REPORT*  Clinical Data:  Diabetic hypertensive with chronic renal disease presenting with right-sided weakness and difficulty speaking.  MRI BRAIN WITHOUT CONTRAST MRA HEAD WITHOUT CONTRAST  Technique: Multiplanar, multiecho pulse sequences of the brain and surrounding structures were obtained according to standard protocol without intravenous contrast.  Angiographic images of the head were obtained using MRA technique without contrast.  Comparison: 02/03/2012 head CT.  No comparison brain MR.  MRI HEAD  Findings:  No acute infarct.  No intracranial hemorrhage.  Small vessel disease type changes.  Global atrophy.  Ventricular prominence felt to be related to atrophy rather hydrocephalus.  No intracranial mass lesion detected on this unenhanced exam.  Partial opacification inferior aspect left mastoid air cells.  No obstructing lesion posterior-superior nasopharynx.  Paranasal sinus mild mucosal thickening.  Cervical spondylotic changes with spinal stenosis most prominent C2- 3.  Transverse ligament hypertrophy.  Major intracranial vascular structures are patent.  Cervical medullary junction, pituitary region, pineal region and orbital structures unremarkable.  IMPRESSION: No acute infarct.  Please see above.  MRA HEAD  Findings: Mild irregularity of the cavernous segment of the internal carotid artery bilaterally.  Anterior circulation without medium or large size vessel significant stenosis or occlusion.  Middle cerebral artery and A2 segment anterior cerebral artery mild irregularity bilaterally.  Left vertebral artery is dominant size.  Mild narrowing portions of the right vertebral artery.  Nonvisualization right PICA.  Mild irregularity of the basilar artery without high-grade stenosis.  Small left AICA.  Moderate narrowing proximal right and distal left superior cerebellar artery.  High-grade focal stenosis of the  right posterior cerebral artery P1- 2 junction.  Marked narrowing distal right posterior cerebral artery superior branch.  No aneurysm noted.  IMPRESSION: Intracranial atherosclerotic type changes most notable involving the right posterior cerebral artery as detailed above.   Original Report Authenticated By: Lacy Duverney, M.D.     Medications: Scheduled Meds:   . aspirin  300 mg Rectal Daily   Or  . aspirin  325 mg Oral Daily  . calcium carbonate  1 tablet Oral Daily  . carvedilol  6.25 mg Oral BID WC  . ciprofloxacin  400 mg Intravenous Q24H  . diltiazem  180 mg Oral QHS  . enoxaparin  30 mg Subcutaneous Q24H  . gabapentin  300 mg Oral BID  . insulin aspart  0-9 Units Subcutaneous TID WC  . insulin glargine  20 Units Subcutaneous QHS  . isosorbide mononitrate  30 mg Oral Daily  . levothyroxine  200 mcg Oral QAC breakfast  . levothyroxine  25 mcg Oral QAC breakfast  . multivitamin with minerals  1 tablet Oral Daily  . neomycin-bacitracin-polymyxin   Topical Daily  . pantoprazole  40 mg Oral Daily  . Tamsulosin HCl  0.4 mg Oral q morning - 10a  . traZODone  50 mg Oral QHS      LOS: 2 days   Ramie Palladino M.D. [facility] 02/05/2012, 9:47 AM Pager:  [phone]  If 7PM-7AM, please contact night-coverage [email or web address] Password [email or web address]

## 2012-02-05 NOTE — Procedures (Signed)
Objective Swallowing Evaluation: Modified Barium Swallowing Study  Patient Details  Name: [name] MRN: [id number] Date of Birth: 1919-02-27  Today's Date: 02/05/2012 Time: 0900-0930 SLP Time Calculation (min): 30 min  Past Medical History:  Past Medical History  Diagnosis Date  . Myocardial infarction   . Coronary artery disease   . Hypertension   . Dysrhythmia   . Peripheral vascular disease   . Hypothyroidism   . Diabetes mellitus   . Presbyesophagus 04/23/2011  . Urinary retention with incomplete bladder emptying 04/23/2011  . UTI (lower urinary tract infection) 04/21/2011  . Diastolic CHF, chronic     Grade I diastolic dysfunction on Echo 04/26/11  . Anaphylaxis 04/25/2011    Thought to be from Rocephin   Past Surgical History:  Past Surgical History  Procedure Date  . Cholecystectomy   . Colonoscopy 01/15/2011    Procedure: COLONOSCOPY;  Surgeon: Theda Belfast, MD;  Location: [facility];  Service: Endoscopy;  Laterality: N/A;   HPI:  79 yo female adm to [facility] with AMS, slurred speech, right sided weakness.  Pt MRI 02/04/12 was negative for acute changes.  PMH + for HTN, chronic renal disease, UTIs, DM, and pt had a fall in November 2013.  Pt does have h/o dysphagia and has undergone MBS in July 2011 and esophagram March 2013.   Pt admits to "regurgitation" with  intake at times but daughter states this has been ongoing for years .  Daughter also reports pt "coughs all the time" and eats at rapid rate.  Daughter also states pt will pocket food in the right side of her mouth and continue to eat even after coughing episode. Pt and daughter deny pt with weight loss or pulmonary infections.      Assessment / Plan / Recommendation Clinical Impression  Dysphagia Diagnosis: Mild oral phase dysphagia;Moderate oral phase dysphagia (combined esophageal dysphagia) Clinical impression: Patient presents with a mild-moderate oropharyngeal dysphagia, likely exacerbated baseline dysphagia  given previous MBS report. Patient impulsive with po intake during self-feeding, consuming large consecutive sips of liquids, which when combined with mild weakness and intermittently delayed swallow reflex, results in penetration and aspiration of thin liquids. Aspiration however occurred in only one episode when combined with pill, and penetrates were trace, clearing mostly during the swallowing and consistently post swallow with spontaneous dry swallow. Additionally, mild tongue base weakness and decreased hyo-laryngeal elevation/excursion results in mild pharyngeal residuals which additionally clear with dry swallow. Patient does endorse regurgitation with po intake which SLP suspects is related to h/o esophageal deficits, particularly in light of today's esophageal sweep revealing what appeared to be solid food stasis in mid esophagus post swallow. Therapeutic intervention,  in addition to providing verbal and tactile cueing for small single sips and intermittent dry swallow, included use of liquid wash which appeared effective at facilitating esophageal clearance. Recommend a dysphagia 3 (mechanical soft) diet with thin liquids at this time, utilizing the above precautions to decrease risk of aspiration which remains moderate.     Treatment Recommendation  Therapy as outlined in treatment plan below    Diet Recommendation Dysphagia 3 (Mechanical Soft);Thin liquid   Liquid Administration via: Cup;Straw Medication Administration: Whole meds with puree Supervision: Patient able to self feed;Full supervision/cueing for compensatory strategies Compensations: Slow rate;Small sips/bites;Check for pocketing;Multiple dry swallows after each bite/sip;Follow solids with liquid Postural Changes and/or Swallow Maneuvers: Seated upright 90 degrees;Upright 30-60 min after meal    Other  Recommendations Oral Care Recommendations: Oral care BID  Follow Up Recommendations  Home health SLP;Skilled Nursing  facility    Frequency and Duration min 2x/week  2 weeks       SLP Swallow Goals Patient will utilize recommended strategies during swallow to increase swallowing safety with: Total assistance   General Date of Onset: 02/04/12 HPI: 79 yo female adm to [facility] with AMS, slurred speech, right sided weakness.  Pt MRI 02/04/12 was negative for acute changes.  PMH + for HTN, chronic renal disease, UTIs, DM, and pt had a fall in November 2013.  Pt does have h/o dysphagia and has undergone MBS in July 2011 and esophagram March 2013.   Pt admits to "regurgitation" with  intake at times but daughter states this has been ongoing for years .  Daughter also reports pt "coughs all the time" and eats at rapid rate.  Daughter also states pt will pocket food in the right side of her mouth and continue to eat even after coughing episode. Pt and daughter deny pt with weight loss or pulmonary infections.  Type of Study: Modified Barium Swallowing Study Reason for Referral: Objectively evaluate swallowing function Previous Swallow Assessment: Bedside swallow evaluation complete 1/10 indicated s/s of aspiration and need for MBS to evaluate swallow objectively. Previous MBS complete 08/19/09 recommended regular diet, thin liquids and esophageal w/u.  Esophagram 03/2011 mild presybesophagus, tertiary contractions, no hiatal hernia   Diet Prior to this Study: Dysphagia 3 (soft);Nectar-thick liquids Temperature Spikes Noted: No Respiratory Status: Room air History of Recent Intubation: No Behavior/Cognition: Requires cueing;Alert;Cooperative;Pleasant mood Oral Cavity - Dentition: Dentures, bottom;Dentures, top Oral Motor / Sensory Function: Impaired - see Bedside swallow eval Self-Feeding Abilities: Able to feed self;Needs assist Patient Positioning: Upright in chair Baseline Vocal Quality: Clear Volitional Cough: Strong Volitional Swallow: Able to elicit Anatomy: Other (Comment) (? prominent CP and possible CP  bar) Pharyngeal Secretions: Not observed secondary MBS    Reason for Referral Objectively evaluate swallowing function   Oral Phase Oral Preparation/Oral Phase Oral Phase: Impaired Oral - Nectar Oral - Nectar Cup: Lingual/palatal residue Oral - Thin Oral - Thin Cup: Lingual/palatal residue Oral - Thin Straw: Lingual/palatal residue Oral - Solids Oral - Puree: Lingual/palatal residue Oral - Mechanical Soft: Lingual/palatal residue Oral - Pill: Delayed oral transit (provided with thin liquids)   Pharyngeal Phase Pharyngeal Phase Pharyngeal Phase: Impaired Pharyngeal - Nectar Pharyngeal - Nectar Cup: Reduced tongue base retraction;Reduced laryngeal elevation;Reduced anterior laryngeal mobility;Pharyngeal residue - valleculae;Pharyngeal residue - pyriform sinuses Pharyngeal - Thin Pharyngeal - Thin Cup: Reduced tongue base retraction;Reduced laryngeal elevation;Reduced anterior laryngeal mobility;Pharyngeal residue - valleculae;Pharyngeal residue - pyriform sinuses;Penetration/Aspiration during swallow;Reduced airway/laryngeal closure Penetration/Aspiration details (thin cup): Material enters airway, remains ABOVE vocal cords and not ejected out (ejected however with spontaneous dry swallow) Pharyngeal - Thin Straw: Reduced tongue base retraction;Reduced laryngeal elevation;Reduced anterior laryngeal mobility;Pharyngeal residue - valleculae;Pharyngeal residue - pyriform sinuses;Penetration/Aspiration after swallow (aspiration occurring when combined with pill only) Penetration/Aspiration details (thin straw): Material enters airway, passes BELOW cords and not ejected out despite cough attempt by patient Pharyngeal - Solids Pharyngeal - Puree: Reduced tongue base retraction;Reduced laryngeal elevation;Reduced anterior laryngeal mobility;Pharyngeal residue - valleculae Pharyngeal - Mechanical Soft: Reduced tongue base retraction;Reduced laryngeal elevation;Reduced anterior laryngeal  mobility;Pharyngeal residue - valleculae Pharyngeal - Pill: Within functional limits (although thin liquids aspirated )  Cervical Esophageal Phase    GO    Cervical Esophageal Phase Cervical Esophageal Phase: Impaired Cervical Esophageal Phase - Nectar Nectar Cup: Prominent cricopharyngeal segment Cervical Esophageal Phase - Thin Thin Cup: Prominent  cricopharyngeal segment Thin Straw: Prominent cricopharyngeal segment Cervical Esophageal Phase - Solids Puree: Prominent cricopharyngeal segment Mechanical Soft: Prominent cricopharyngeal segment Pill: Prominent cricopharyngeal segment Cervical Esophageal Phase - Comment Cervical Esophageal Comment: ? of CP bar also noted however difficult to view and did not impede clearance of bolus through upper esophagus. Esophageal sweep did reveal what appeared to be solid food stais post swallow, clearing with clinician facilitated liquid wash.         Ferdinand Lango MA, CCC-SLP [phone]  Ferdinand Lango Meryl 02/05/2012, 9:45 AM

## 2012-02-05 NOTE — Progress Notes (Signed)
0300 CBG 423, MD notified, new orders received for 6u Novolog. (Pt did eat entire dinner tray around 10pm.) Will conitnue to monitor.  Earnest Conroy. Clelia Croft, RN

## 2012-02-06 ENCOUNTER — Inpatient Hospital Stay (HOSPITAL_COMMUNITY): Payer: Medicare Other

## 2012-02-06 DIAGNOSIS — I10 Essential (primary) hypertension: Secondary | ICD-10-CM

## 2012-02-06 DIAGNOSIS — R471 Dysarthria and anarthria: Secondary | ICD-10-CM | POA: Diagnosis present

## 2012-02-06 DIAGNOSIS — E860 Dehydration: Secondary | ICD-10-CM

## 2012-02-06 MED ORDER — HYDRALAZINE HCL 20 MG/ML IJ SOLN
5.0000 mg | Freq: Four times a day (QID) | INTRAMUSCULAR | Status: DC | PRN
  Administered 2012-02-07: 5 mg via INTRAVENOUS
  Filled 2012-02-06: qty 1

## 2012-02-06 NOTE — Progress Notes (Signed)
Patient ID: [name]  female  ZOX:[id number]    DOB: 1919-05-28    DOA: 02/03/2012  PCP: [name], MD  Assessment/Plan: Principal Problem:  Dysarthria/right hemiparesis  - Dr. Isidoro Donning discussed with patient's daughter on 1/11, [name], patient's dysarthria started after Christmas suddenly. She was not brought to the hospital at that time. On examination, right hemiparesis appears to have somewhat improved (? Baseline vs on admission). Per daughter, patient does not have a prior history of stroke. - Swallow evaluation done- advance diet to dysphagia 3 with a liquids - MRI head does not show acute stroke. Unclear etiology of presentation- ? Parkinsons versus neurology ruling out C-spine issues. Followup MRI C-spine. Continue aspirin. - Dr. Isidoro Donning discussed with neurology, Dr. Roseanne Reno, who recommended continuing aspirin as there is no clear indication of stroke on the MRI and patient could have dysarthria due to overall generalized weakness in UTI.  Active problems: No UTI  UA was obtained prior to antibiotics. Urine culture showed no growth. Discontinue Cipro.   Acute on chronic kidney disease  Secondary to dehydration. Temporarily hold Lasix.  Creatinine improving. Discontinue IV fluids and follow BMP in a.m.  Hyperkalemia  Likely secondary to acute renal failure. Resolved   Hypertension  Reasonably controlled. Continue Cardizem and carvedilol. Monitor   Type 2 diabetes mellitus: Uncontrolled  Continue Lantus 20 units, placed on meal coverage, change sliding scale to moderate. Fluctuating but reasonable.   Hypothyroidism  Continue Synthroid.   Feet ulcers  Wound care consulted. Likely secondary to diabetic neuropathy.   Chronic diastolic CHF/CAD  Compensated. Continue Imdur, ASA, Coreg. Hold Lasix until the renal function is stabilized  Anemia Follow CBC in a.m. No bleeding.  DVT Prophylaxis: lovenox Code Status: Full Family communication: Discussed with  patient Disposition: to be determined. PT recommends CIR/SNF. Will consult CIR.   Subjective:  Patient indicates that her slurred speech is better. Denies any other complaints.   Objective: Weight change:   Intake/Output Summary (Last 24 hours) at 02/06/12 1404 Last data filed at 02/06/12 0900  Gross per 24 hour  Intake    820 ml  Output      0 ml  Net    820 ml   Blood pressure 159/42, pulse 74, temperature 98 F (36.7 C), temperature source Oral, resp. rate 20, height 5\' 8"  (1.727 m), weight 79.379 kg (175 lb), SpO2 93.00%.  Physical Exam: General: Alert and awake, oriented x3, not in any acute distress, dysarthria CVS: S1-S2 clear, no murmur rubs or gallops. Telemetry shows sinus rhythm.  Chest: clear to auscultation bilaterally, no wheezing, rales or rhonchi Abdomen: soft nontender, nondistended, normal bowel sounds, no organomegaly Extremities: no cyanosis, clubbing or edema noted bilaterally.? 4+/5 power right upper extremity  Neuro: Dysarthria,  no other cranial nerve deficits.   Lab Results: Basic Metabolic Panel:  Lab 02/05/12 8119 02/04/12 0540  NA 136 135  K 4.5 4.6  CL 103 100  CO2 25 26  GLUCOSE 289* 230*  BUN 29* 38*  CREATININE 1.69* 1.92*  CALCIUM 8.7 8.5  MG -- --  PHOS -- --   Liver Function Tests: No results found for this basename: AST:2,ALT:2,ALKPHOS:2,BILITOT:2,PROT:2,ALBUMIN:2 in the last 168 hours No results found for this basename: LIPASE:2,AMYLASE:2 in the last 168 hours No results found for this basename: AMMONIA:2 in the last 168 hours CBC:  Lab 02/05/12 0500 02/03/12 1840  WBC 7.9 13.8*  NEUTROABS -- 10.8*  HGB 10.7* 12.0  HCT 34.0* 36.6  MCV 89.7 88.2  PLT 238 285   Cardiac Enzymes:  Lab 02/03/12 1840  CKTOTAL --  CKMB --  CKMBINDEX --  TROPONINI <0.30   BNP: No components found with this basename: POCBNP:2 CBG:  Lab 02/06/12 0726 02/05/12 2224 02/05/12 1744 02/05/12 1135 02/05/12 0844  GLUCAP 171* 210* 172* 251* 174*      Micro Results: Recent Results (from the past 240 hour(s))  URINE CULTURE     Status: Normal   Collection Time   02/03/12  6:18 PM      Component Value Range Status Comment   Specimen Description URINE, CATHETERIZED   Final    Special Requests NONE   Final    Culture  Setup Time 02/04/2012 02:42   Final    Colony Count NO GROWTH   Final    Culture NO GROWTH   Final    Report Status 02/05/2012 FINAL   Final   CLOSTRIDIUM DIFFICILE BY PCR     Status: Normal   Collection Time   02/05/12  4:05 AM      Component Value Range Status Comment   C difficile by pcr NEGATIVE  NEGATIVE Final     Studies/Results: Dg Chest 2 View  02/04/2012  *RADIOLOGY REPORT*  Clinical Data: Stroke  CHEST - 2 VIEW  Comparison: 12/26/2011  Findings: Mild vascular congestion is present but this has improved.  The heart is mildly enlarged.  Bibasilar atelectasis. No new consolidation or mass.  No Kerley B lines to suggest interstitial edema. Sclerotic changes in the proximal right humerus are stable.  IMPRESSION: Improving vascular congestion.   Original Report Authenticated By: Jolaine Click, M.D.    Ct Head Wo Contrast  02/03/2012  *RADIOLOGY REPORT*  Clinical Data: Slurred speech.  Unsteady gait.  Right-sided weakness.  CT HEAD WITHOUT CONTRAST  Technique:  Contiguous axial images were obtained from the base of the skull through the vertex without contrast.  Comparison: 02/21/2008  Findings: There is no evidence for acute infarction, intracranial hemorrhage, mass lesion, hydrocephalus, or extra-axial fluid. Moderate to severe atrophy is present.  Advanced chronic microvascular ischemic change is present in the periventricular and subcortical white matter.  Chronic hypodensity left insular cortex.  No CT signs of proximal vascular thrombosis in the left MCA territory.  No evidence for chronic subdural hematoma.  Calvarium intact.  Advanced carotid and vertebral atherosclerosis.  Clear sinuses and mastoids.  IMPRESSION:  Stable exam.  Atrophy and chronic microvascular ischemic change without visible acute stroke or hemorrhage.   Original Report Authenticated By: Davonna Belling, M.D.    Mr [facility] Wo Contrast  02/04/2012  *RADIOLOGY REPORT*  Clinical Data:  Diabetic hypertensive with chronic renal disease presenting with right-sided weakness and difficulty speaking.  MRI BRAIN WITHOUT CONTRAST MRA HEAD WITHOUT CONTRAST  Technique: Multiplanar, multiecho pulse sequences of the brain and surrounding structures were obtained according to standard protocol without intravenous contrast.  Angiographic images of the head were obtained using MRA technique without contrast.  Comparison: 02/03/2012 head CT.  No comparison brain MR.  MRI HEAD  Findings:  No acute infarct.  No intracranial hemorrhage.  Small vessel disease type changes.  Global atrophy.  Ventricular prominence felt to be related to atrophy rather hydrocephalus.  No intracranial mass lesion detected on this unenhanced exam.  Partial opacification inferior aspect left mastoid air cells.  No obstructing lesion posterior-superior nasopharynx.  Paranasal sinus mild mucosal thickening.  Cervical spondylotic changes with spinal stenosis most prominent C2- 3.  Transverse ligament hypertrophy.  Major intracranial  vascular structures are patent.  Cervical medullary junction, pituitary region, pineal region and orbital structures unremarkable.  IMPRESSION: No acute infarct.  Please see above.  MRA HEAD  Findings: Mild irregularity of the cavernous segment of the internal carotid artery bilaterally.  Anterior circulation without medium or large size vessel significant stenosis or occlusion.  Middle cerebral artery and A2 segment anterior cerebral artery mild irregularity bilaterally.  Left vertebral artery is dominant size.  Mild narrowing portions of the right vertebral artery.  Nonvisualization right PICA.  Mild irregularity of the basilar artery without high-grade stenosis.  Small left  AICA.  Moderate narrowing proximal right and distal left superior cerebellar artery.  High-grade focal stenosis of the right posterior cerebral artery P1- 2 junction.  Marked narrowing distal right posterior cerebral artery superior branch.  No aneurysm noted.  IMPRESSION: Intracranial atherosclerotic type changes most notable involving the right posterior cerebral artery as detailed above.   Original Report Authenticated By: Lacy Duverney, M.D.    Mr Brain Wo Contrast  02/04/2012  *RADIOLOGY REPORT*  Clinical Data:  Diabetic hypertensive with chronic renal disease presenting with right-sided weakness and difficulty speaking.  MRI BRAIN WITHOUT CONTRAST MRA HEAD WITHOUT CONTRAST  Technique: Multiplanar, multiecho pulse sequences of the brain and surrounding structures were obtained according to standard protocol without intravenous contrast.  Angiographic images of the head were obtained using MRA technique without contrast.  Comparison: 02/03/2012 head CT.  No comparison brain MR.  MRI HEAD  Findings:  No acute infarct.  No intracranial hemorrhage.  Small vessel disease type changes.  Global atrophy.  Ventricular prominence felt to be related to atrophy rather hydrocephalus.  No intracranial mass lesion detected on this unenhanced exam.  Partial opacification inferior aspect left mastoid air cells.  No obstructing lesion posterior-superior nasopharynx.  Paranasal sinus mild mucosal thickening.  Cervical spondylotic changes with spinal stenosis most prominent C2- 3.  Transverse ligament hypertrophy.  Major intracranial vascular structures are patent.  Cervical medullary junction, pituitary region, pineal region and orbital structures unremarkable.  IMPRESSION: No acute infarct.  Please see above.  MRA HEAD  Findings: Mild irregularity of the cavernous segment of the internal carotid artery bilaterally.  Anterior circulation without medium or large size vessel significant stenosis or occlusion.  Middle cerebral  artery and A2 segment anterior cerebral artery mild irregularity bilaterally.  Left vertebral artery is dominant size.  Mild narrowing portions of the right vertebral artery.  Nonvisualization right PICA.  Mild irregularity of the basilar artery without high-grade stenosis.  Small left AICA.  Moderate narrowing proximal right and distal left superior cerebellar artery.  High-grade focal stenosis of the right posterior cerebral artery P1- 2 junction.  Marked narrowing distal right posterior cerebral artery superior branch.  No aneurysm noted.  IMPRESSION: Intracranial atherosclerotic type changes most notable involving the right posterior cerebral artery as detailed above.   Original Report Authenticated By: Lacy Duverney, M.D.     Medications: Scheduled Meds:    . aspirin  300 mg Rectal Daily   Or  . aspirin  325 mg Oral Daily  . calcium carbonate  1 tablet Oral Daily  . carvedilol  6.25 mg Oral BID WC  . ciprofloxacin  400 mg Intravenous Q24H  . diltiazem  180 mg Oral QHS  . enoxaparin  30 mg Subcutaneous Q24H  . gabapentin  300 mg Oral BID  . insulin aspart  0-15 Units Subcutaneous TID WC  . insulin aspart  0-5 Units Subcutaneous QHS  . insulin aspart  4 Units Subcutaneous TID WC  . insulin glargine  20 Units Subcutaneous QHS  . isosorbide mononitrate  30 mg Oral Daily  . levothyroxine  200 mcg Oral QAC breakfast  . levothyroxine  25 mcg Oral QAC breakfast  . multivitamin with minerals  1 tablet Oral Daily  . neomycin-bacitracin-polymyxin   Topical Daily  . pantoprazole  40 mg Oral Daily  . Tamsulosin HCl  0.4 mg Oral q morning - 10a  . traZODone  50 mg Oral QHS      LOS: 3 days   [facility] M.D. [facility] 02/06/2012, 2:04 PM Pager: [phone]  If 7PM-7AM, please contact night-coverage [email or web address] Password [email or web address]

## 2012-02-07 ENCOUNTER — Encounter (HOSPITAL_COMMUNITY): Payer: Self-pay | Admitting: Physical Medicine and Rehabilitation

## 2012-02-07 DIAGNOSIS — R5381 Other malaise: Secondary | ICD-10-CM

## 2012-02-07 DIAGNOSIS — R269 Unspecified abnormalities of gait and mobility: Secondary | ICD-10-CM

## 2012-02-07 DIAGNOSIS — I6330 Cerebral infarction due to thrombosis of unspecified cerebral artery: Secondary | ICD-10-CM

## 2012-02-07 LAB — BASIC METABOLIC PANEL
BUN: 18 mg/dL (ref 6–23)
Creatinine, Ser: 1.27 mg/dL — ABNORMAL HIGH (ref 0.50–1.10)
GFR calc Af Amer: 42 mL/min — ABNORMAL LOW (ref >90)
GFR calc non Af Amer: 36 mL/min — ABNORMAL LOW (ref >90)

## 2012-02-07 LAB — GLUCOSE, CAPILLARY
Glucose-Capillary: 130 mg/dL — ABNORMAL HIGH (ref 70–99)
Glucose-Capillary: 326 mg/dL — ABNORMAL HIGH (ref 70–99)
Glucose-Capillary: 265 mg/dL — ABNORMAL HIGH (ref 70–99)

## 2012-02-07 LAB — CBC
HCT: 34 % — ABNORMAL LOW (ref 36.0–46.0)
MCH: 28.9 pg (ref 26.0–34.0)
MCHC: 32.9 g/dL (ref 30.0–36.0)
MCV: 87.9 fL (ref 78.0–100.0)
RDW: 13.4 % (ref 11.5–15.5)

## 2012-02-07 NOTE — Consult Note (Signed)
Physical Medicine and Rehabilitation Consult Reason for Consult: Right sided weakness with intermittent speech difficulties.  Referring Physician:  Dr. Isidoro Donning.   HPI: [name] is a 79 y.o. female with history of DM, chronic diarrhes, gait disorder with multiple falls, who was admitted 02/03/12 with increasing weakness, speech difficulties with right sided weakness after Thanksgiving and tendency to drag right foot. MRI/MRA brain without acute changes. Neurology consulted and MRI C-spine with multifactorial cervical stenosis from C2/C3 to C6/C7 with moderate to severe foraminal stenosis   Review of Systems  HENT: Negative for hearing loss.   Eyes: Negative for blurred vision and double vision.  Respiratory: Negative for shortness of breath.   Cardiovascular: Negative for chest pain and palpitations.  Gastrointestinal: Negative for heartburn and nausea.  Neurological: Negative for sensory change, speech change, focal weakness and headaches.  Psychiatric/Behavioral: The patient is not nervous/anxious.    Past Medical History  Diagnosis Date  . Myocardial infarction   . Coronary artery disease   . Hypertension   . Dysrhythmia   . Peripheral vascular disease   . Hypothyroidism   . Diabetes mellitus   . Presbyesophagus 04/23/2011  . Urinary retention with incomplete bladder emptying 04/23/2011  . UTI (lower urinary tract infection) 04/21/2011  . Diastolic CHF, chronic     Grade I diastolic dysfunction on Echo 04/26/11  . Anaphylaxis 04/25/2011    Thought to be from Rocephin   Past Surgical History  Procedure Date  . Cholecystectomy   . Colonoscopy 01/15/2011    Procedure: COLONOSCOPY;  Surgeon: Theda Belfast, MD;  Location: [facility];  Service: Endoscopy;  Laterality: N/A;   Family History  Problem Relation Age of Onset  . Cancer Sister   . Diabetes Mother   . Heart disease Father     Social History: Lives alone. Has an aide 4 hours/day to help with "cats, housework".  Daughter does the cooking.  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 She reports that she quit smoking about 30 years ago. Her smoking use included Cigarettes. She has never used smokeless tobacco. She reports that she drinks alcohol. She reports that she does not use illicit drugs.   Allergies  Allergen Reactions  . Ephedrine   . Morphine Other (See Comments)    Difficulty waking up & talking after surgery  . Rocephin (Ceftriaxone Sodium In Dextrose)     Developed acute resp distress, wheezing after 2nd dose.   Medications Prior to Admission  Medication Sig Dispense Refill  . aspirin 81 MG chewable tablet Chew 324 mg by mouth once.      . calcium carbonate (OS-CAL - DOSED IN MG OF ELEMENTAL CALCIUM) 1250 MG tablet Take 1 tablet by mouth daily.      . carvedilol (COREG) 6.25 MG tablet Take 6.25 mg by mouth 2 (two) times daily with a meal.        . diltiazem (CARDIZEM CD) 180 MG 24 hr capsule Take 180 mg by mouth at bedtime.       . furosemide (LASIX) 20 MG tablet Take 20 mg by mouth 2 (two) times daily.        Marland Kitchen gabapentin (NEURONTIN) 300 MG capsule Take 300 mg by mouth 2 (two) times daily.       . insulin glargine (LANTUS) 100 UNIT/ML injection Inject 35 Units into the skin at bedtime.       . insulin lispro (HUMALOG) 100 UNIT/ML injection Inject 14 Units into the skin 3 (three)  times daily as needed. Depends on blood sugar      . isosorbide mononitrate (IMDUR) 30 MG 24 hr tablet Take 30 mg by mouth daily.        Marland Kitchen levothyroxine (SYNTHROID, LEVOTHROID) 200 MCG tablet Take 200 mcg by mouth every morning. Takes along with 25mg  tablet together      . levothyroxine (SYNTHROID, LEVOTHROID) 25 MCG tablet Take 25 mcg by mouth every morning. Takes along with 200mg  tablet together      . Multiple Vitamin (MULITIVITAMIN WITH MINERALS) TABS Take 1 tablet by mouth daily.      . pantoprazole (PROTONIX) 40 MG tablet Take 1 tablet (40 mg total) by mouth daily.  30 tablet  0  . Tamsulosin  HCl (FLOMAX) 0.4 MG CAPS Take 0.4 mg by mouth every morning.       . traZODone (DESYREL) 50 MG tablet Take 1 tablet (50 mg total) by mouth at bedtime.  30 tablet  0    Home: Home Living Lives With: Alone Available Help at Discharge: Personal care attendant (from 9-530 daily.) Type of Home: House Home Layout: One level Home Adaptive Equipment: Walker - rolling  Functional History: Prior Function Able to Take Stairs?: Yes Driving: No Vocation: Retired Functional Status:  Mobility: Bed Mobility Bed Mobility: Supine to Sit;Rolling Right;Rolling Left;Sit to Supine Rolling Right: 4: Min assist;With rail Rolling Left: 4: Min assist;With rail Supine to Sit: 1: +2 Total assist Supine to Sit: Patient Percentage: 60% Transfers Transfers: Sit to Stand;Stand to Sit Sit to Stand: 4: Min assist;With upper extremity assist;With armrests;From chair/3-in-1 Stand to Sit: 4: Min assist;With upper extremity assist;1: +2 Total assist Ambulation/Gait Ambulation/Gait Assistance: 3: Mod assist Ambulation Distance (Feet): 25 Feet Assistive device: Rolling walker Ambulation/Gait Assistance Details: pt needs more assist with balance and safety as she continues to walk Gait Pattern: Step-through pattern;Decreased step length - right;Decreased step length - left Gait velocity: decreased General Gait Details: pt noted to have more diffculty with heel strike and swing though as she fatigues Stairs: No Wheelchair Mobility Wheelchair Mobility: No  ADL: ADL Grooming: Denture care;Brushing hair;Minimal assistance Where Assessed - Grooming: Unsupported sitting Upper Body Bathing: Minimal assistance Where Assessed - Upper Body Bathing: Unsupported sitting Lower Body Bathing: Maximal assistance Where Assessed - Lower Body Bathing: Supported sit to stand Upper Body Dressing: Minimal assistance Where Assessed - Upper Body Dressing: Unsupported sitting Lower Body Dressing: Maximal assistance Where Assessed  - Lower Body Dressing: Supported sit to Pharmacist, hospital: Moderate assistance Toilet Transfer Method: Stand pivot Toilet Transfer Equipment: Bedside commode Equipment Used: Rolling walker ADL Comments: Pt completed SPT from chair>bsc>chair. MAX cues for safety and hand placement. Pt has tendency to abandon RW before reaching destination. Pt required A to wipe back peri area following toileting and for thoroughness when cleaning dentures.  Cognition: Cognition Overall Cognitive Status: Appears within functional limits for tasks assessed (suspect at baseline) Arousal/Alertness: Awake/alert Orientation Level: Oriented X4 Attention: Focused;Sustained Focused Attention: Appears intact Sustained Attention: Appears intact Memory: Impaired Memory Impairment: Decreased recall of new information;Decreased short term memory (suspect baseline) Decreased Short Term Memory: Verbal complex;Functional complex Awareness: Appears intact Problem Solving: Appears intact (for basic ADLs) Behaviors: Impulsive (with self feeding) Safety/Judgment: Appears intact Cognition Overall Cognitive Status: Appears within functional limits for tasks assessed/performed Arousal/Alertness: Awake/alert Orientation Level: Appears intact for tasks assessed Behavior During Session: [facility] for tasks performed  Blood pressure 153/59, pulse 70, temperature 97.8 F (36.6 C), temperature source Oral, resp. rate 20, height 5\' 8"  (  1.727 m), weight 79.379 kg (175 lb), SpO2 95.00%.  Physical Exam  Constitutional: She is oriented to person, place, and time. She appears well-developed and well-nourished.  HENT:  Head: Normocephalic and atraumatic.  Eyes: Pupils are equal, round, and reactive to light.  Neck: Normal range of motion.  Cardiovascular: Normal rate and regular rhythm.   Pulmonary/Chest: Effort normal and breath sounds normal.  Abdominal: Soft. Bowel sounds are normal.  Neurological: She is alert and oriented to  person, place, and time. No cranial nerve deficit. She exhibits abnormal muscle tone. Coordination normal.       Mild dysphonia. Right sided weakness with sensory changes. RUE 4/5. RLE is 2 prox to 3-4 distally. Sensory exam 1+ /2 RLE, lesser involved RUE.   Skin: Skin is warm and dry.       Bilateral heel ulcers.   Psychiatric: She has a normal mood and affect. Her behavior is normal. Judgment and thought content normal.    Results for orders placed during the hospital encounter of 02/03/12 (from the past 24 hour(s))  GLUCOSE, CAPILLARY     Status: Abnormal   Collection Time   02/06/12 11:23 AM      Component Value Range   Glucose-Capillary 228 (*) 70 - 99 mg/dL  GLUCOSE, CAPILLARY     Status: Normal   Collection Time   02/06/12  4:36 PM      Component Value Range   Glucose-Capillary 85  70 - 99 mg/dL  GLUCOSE, CAPILLARY     Status: Abnormal   Collection Time   02/06/12  9:44 PM      Component Value Range   Glucose-Capillary 174 (*) 70 - 99 mg/dL  GLUCOSE, CAPILLARY     Status: Abnormal   Collection Time   02/07/12  3:01 AM      Component Value Range   Glucose-Capillary 171 (*) 70 - 99 mg/dL   Comment 1 Notify RN    CBC     Status: Abnormal   Collection Time   02/07/12  4:50 AM      Component Value Range   WBC 9.1  4.0 - 10.5 K/uL   RBC 3.87  3.87 - 5.11 MIL/uL   Hemoglobin 11.2 (*) 12.0 - 15.0 g/dL   HCT 16.1 (*) 09.6 - 04.5 %   MCV 87.9  78.0 - 100.0 fL   MCH 28.9  26.0 - 34.0 pg   MCHC 32.9  30.0 - 36.0 g/dL   RDW 40.9  81.1 - 91.4 %   Platelets 263  150 - 400 K/uL  BASIC METABOLIC PANEL     Status: Abnormal   Collection Time   02/07/12  4:50 AM      Component Value Range   Sodium 136  135 - 145 mEq/L   Potassium 4.2  3.5 - 5.1 mEq/L   Chloride 101  96 - 112 mEq/L   CO2 27  19 - 32 mEq/L   Glucose, Bld 159 (*) 70 - 99 mg/dL   BUN 18  6 - 23 mg/dL   Creatinine, Ser 7.82 (*) 0.50 - 1.10 mg/dL   Calcium 9.0  8.4 - 95.6 mg/dL   GFR calc non Af Amer 36 (*) >90 mL/min    GFR calc Af Amer 42 (*) >90 mL/min  GLUCOSE, CAPILLARY     Status: Abnormal   Collection Time   02/07/12  7:34 AM      Component Value Range   Glucose-Capillary 130 (*) 70 - 99 mg/dL  Comment 1 Notify RN     Comment 2 Documented in Chart     Mr Cervical Spine W Wo Contrast  02/06/2012  *RADIOLOGY REPORT*  Clinical Data: 79 year old female with right side weakness.  The patient refused postcontrast imaging.  MRI CERVICAL SPINE WITHOUT CONTRAST  Technique:  Multiplanar and multiecho pulse sequences of the cervical spine, to include the craniocervical junction and cervicothoracic junction, were obtained according to standard protocol without intravenous contrast.  Contrast:  None.  Comparison:  Brain MRI 02/04/2012.  Cervical radiographs 05/20/2009.  Findings:  Cervicomedullary junction is within normal limits.  Cervical vertebral height and alignment appears overall stable since 2011, including trace anterolisthesis of C4 on C5.  Chronic disc space loss and probable interbody ankylosis at C5-C6.  Chronic T3 mild to moderate compression fracture.  Less pronounced chronic T2 compression fracture but with more anterior wedging. No marrow edema or evidence of acute osseous abnormality.    Visualized paraspinal soft tissues are within normal limits.  Small left mastoid effusion re-identified.  Detail of the spinal cord is intermittently degraded by motion artifact.  No definite spinal cord signal abnormality in the cervical spine or visualized upper thoracic spine.  C2-C3:  Borderline spinal stenosis without definite spinal cord mass effect related to small disc protrusion and severe ligament flavum hypertrophy. Moderate bilateral C3 foraminal stenosis.  C3-C4:  Mild spinal stenosis with perhaps mild spinal cord mass effect related to circumferential disc osteophyte complex and moderate ligament flavum hypertrophy.  Severe left and moderate right C4 foraminal stenosis.  C4-C5:  Mild spinal stenosis related to  circumferential disc osteophyte complex and moderate ligament flavum hypertrophy.  No definite spinal cord mass effect.  Moderate to severe facet hypertrophy on the left.  Severe bilateral C5 foraminal stenosis.  C5-C6:  Suspected interbody ankylosis.  Right greater than left uncovertebral and facet hypertrophy.  No spinal stenosis.  Severe right and mild left C6 foraminal stenosis.  C6-C7:  Circumferential disc osteophyte complex and moderate to severe ligament flavum hypertrophy results in borderline spinal stenosis without mass effect on the spinal cord.  No significant foraminal stenosis.  C7-T1:  Negative disc.  Moderate ligament flavum hypertrophy.  Mild to moderate facet hypertrophy.  Moderate right C8 foraminal stenosis.  Mild chronic retropulsion of bone at the T3 compression fracture site, but no significant upper thoracic spinal or foraminal stenosis is identified.  IMPRESSION: 1.  Degenerative multifactorial cervical spinal stenosis from C2-C3 to C6-C7, most pronounced at C3-C4 and C4-C5.  There may be mild spinal cord mass effect, but there is no definite spinal cord signal abnormality. 2.  Degenerative moderate or severe neural foraminal stenosis at the bilateral C3, C4, C5, right C6, and right C8 nerve levels. 3.  Chronic T2 and T3 compression fractures.   Original Report Authenticated By: Erskine Speed, M.D.     Assessment/Plan: Diagnosis: ?small left subcortical/brainstem infarct. 1. Does the need for close, 24 hr/day medical supervision in concert with the patient's rehab needs make it unreasonable for this patient to be served in a less intensive setting? Yes 2. Co-Morbidities requiring supervision/potential complications: dm, renal failure, hyponatremia 3. Due to bladder management, bowel management, safety, skin/wound care, disease management, medication administration, pain management and patient education, does the patient require 24 hr/day rehab nursing? Yes 4. Does the patient require  coordinated care of a physician, rehab nurse, PT (1-2 hrs/day, 5 days/week) and OT (1-2 hrs/day, 5 days/week) to address physical and functional deficits in the context of the above  medical diagnosis(es)? Yes Addressing deficits in the following areas: balance, endurance, locomotion, strength, transferring, bowel/bladder control, bathing, dressing, feeding, grooming, toileting, cognition and psychosocial support 5. Can the patient actively participate in an intensive therapy program of at least 3 hrs of therapy per day at least 5 days per week? Yes 6. The potential for patient to make measurable gains while on inpatient rehab is good 7. Anticipated functional outcomes upon discharge from inpatient rehab are supervision  with PT, supervision with OT, n/a with SLP. 8. Estimated rehab length of stay to reach the above functional goals is: 10-14 days 9. Does the patient have adequate social supports to accommodate these discharge functional goals? Potentially 10. Anticipated D/C setting: Home 11. Anticipated post D/C treatments: HH therapy 12. Overall Rehab/Functional Prognosis: good  RECOMMENDATIONS: This patient's condition is appropriate for continued rehabilitative care in the following setting: CIR Patient has agreed to participate in recommended program. Yes Note that insurance prior authorization may be required for reimbursement for recommended care.  Comment: will need some assistance at home upon dicharge. Rehab RN to follow up.   Ivory Broad, MD     02/07/2012

## 2012-02-07 NOTE — Progress Notes (Signed)
Physical Therapy Treatment Patient Details Name: [name] MRN: [id number] DOB: 1919/02/20 Today's Date: 02/07/2012 Time: 2956-2130 PT Time Calculation (min): 27 min  PT Assessment / Plan / Recommendation Comments on Treatment Session  Pt ambulated into bathroom and then hallway.  Pt fatigues quickly with ambulation however once back in supine was able to rest then perform a few exercises.  Pt requiring assist for R LE eccentric control during exercises.    Follow Up Recommendations  CIR     Does the patient have the potential to tolerate intense rehabilitation     Barriers to Discharge        Equipment Recommendations  None recommended by PT    Recommendations for Other Services    Frequency     Plan Discharge plan remains appropriate;Frequency remains appropriate    Precautions / Restrictions Precautions Precautions: Fall   Pertinent Vitals/Pain No pain Pt difficult to arouse (SaO2 and HR WNL) however once standing pt more talkative and alert and able to participate in session.    Mobility  Bed Mobility Bed Mobility: Sit to Supine Sit to Supine: 4: Min assist;With rail;HOB flat Details for Bed Mobility Assistance: assist for LEs, verbal cues for technique Transfers Transfers: Sit to Stand;Stand to Sit Sit to Stand: 4: Min assist;With upper extremity assist;From chair/3-in-1;From toilet Stand to Sit: 4: Min assist;With upper extremity assist;To bed;To toilet Details for Transfer Assistance: multimodal cues for hand placement and safety, assist to rise and steady and then control descent Ambulation/Gait Ambulation/Gait Assistance: 4: Min assist Ambulation Distance (Feet): 80 Feet Assistive device: Rolling walker Ambulation/Gait Assistance Details: assist for LOB x1, pt fatigues quickly, manual assist for negotiating RW as well Gait Pattern: Step-through pattern;Decreased stride length Gait velocity: decreased    Exercises General Exercises - Lower  Extremity Ankle Circles/Pumps: AROM;15 reps;Both Gluteal Sets: AROM;Both;15 reps Short Arc Quad: AROM;Both;15 reps;AAROM (assist for L LE eccentric control) Heel Slides: AROM;AAROM;Both;15 reps Hip ABduction/ADduction: AROM;Both;15 reps Straight Leg Raises: AROM;AAROM;Both;15 reps (assist for L LE eccentric control)   PT Diagnosis:    PT Problem List:   PT Treatment Interventions:     PT Goals Acute Rehab PT Goals PT Goal: Sit to Supine/Side - Progress: Progressing toward goal PT Goal: Sit to Stand - Progress: Progressing toward goal PT Goal: Stand to Sit - Progress: Progressing toward goal PT Goal: Ambulate - Progress: Progressing toward goal  Visit Information  Last PT Received On: 02/07/12 Assistance Needed: +2 (safety)    Subjective Data  Subjective: I had a drink an hour ago.  (humor after LOB x1 during ambulation)   Cognition  Overall Cognitive Status: Difficult to assess Difficult to assess due to: Level of arousal Arousal/Alertness: Lethargic Orientation Level: Disoriented to;Time Behavior During Session: Lethargic Cognition - Other Comments: Pt did start to become more and more alert as session progressed.    Balance  Static Sitting Balance Static Sitting - Balance Support: No upper extremity supported;Feet supported Static Sitting - Level of Assistance: 5: Stand by assistance  End of Session PT - End of Session Activity Tolerance: Patient limited by fatigue Patient left: in bed;with call bell/phone within reach;with bed alarm set   GP     Justan Gaede,[name] 02/07/2012, 4:45 PM Pager: [phone]

## 2012-02-07 NOTE — Progress Notes (Signed)
Plan to see pt Tues am to discuss CIR.  Have spoken w/ pt's daughter who wants CIR if pt appropriate.  Daughter made aware that pt will need 24/7 care [at best supervision] after d/c from CIR or SNF.  [phone]

## 2012-02-07 NOTE — Progress Notes (Signed)
Occupational Therapy Treatment Patient Details Name: [name] MRN: [id number] DOB: September 09, 1919 Today's Date: 02/07/2012 Time: 4098-1191 OT Time Calculation (min): 25 min  OT Assessment / Plan / Recommendation Comments on Treatment Session Pt sound asleep upon therapy's arrival and very difficult to arouse even with sternal rub and cold washcloth. Pt was able to fully awaken but was confused as to what time it was stating she needed to eat breakfast. Pt 's slurred speech was much more pronounced (likely 2* lethargy.)    Follow Up Recommendations  CIR;SNF    Barriers to Discharge       Equipment Recommendations  3 in 1 bedside comode    Recommendations for Other Services Rehab consult  Frequency Min 2X/week   Plan Discharge plan remains appropriate    Precautions / Restrictions     Pertinent Vitals/Pain Vitals stable on RA. No c/o pain    ADL  Grooming: Brushing hair;Maximal assistance (2* lethargy) Lower Body Dressing: Maximal assistance Where Assessed - Lower Body Dressing: Unsupported sitting (to don socks (pt too lethargic.)) Toilet Transfer: Minimal assistance Toilet Transfer Method: Sit to stand Toilet Transfer Equipment: Comfort height toilet;Grab bars Toileting - Clothing Manipulation and Hygiene: Minimal assistance (max cues for safety ) Where Assessed - Toileting Clothing Manipulation and Hygiene: Sit to stand from 3-in-1 or toilet Transfers/Ambulation Related to ADLs: Pt ambulated to the bathroom with min A after several minutes of trying to arouse pt. Max cueing for safety manipulating RW in the bathroom and around obstacles.    OT Diagnosis:    OT Problem List:   OT Treatment Interventions:     OT Goals ADL Goals ADL Goal: Toilet Transfer - Progress: Progressing toward goals ADL Goal: Toileting - Clothing Manipulation - Progress: Progressing toward goals ADL Goal: Toileting - Hygiene - Progress: Progressing toward goals  Visit Information  Last OT  Received On: 02/07/12 Assistance Needed: +2 (for safety) PT/OT Co-Evaluation/Treatment: Yes    Subjective Data  Subjective: Don't I have to have breakfast first?   Prior Functioning       Cognition  Overall Cognitive Status: Difficult to assess Difficult to assess due to: Level of arousal Arousal/Alertness: Lethargic Orientation Level: Disoriented to;Time Behavior During Session: Lethargic Cognition - Other Comments: Pt did start to become more and more alert as session progressed.    Mobility  Shoulder Instructions Bed Mobility Bed Mobility: Sit to Supine Sit to Supine: 4: Min assist;With rail;HOB flat Details for Bed Mobility Assistance: Min A needed for LEs. min cues for technique. Transfers Sit to Stand: 4: Min assist;With upper extremity assist;From chair/3-in-1;From toilet Stand to Sit: 4: Min assist;With upper extremity assist;To bed;To toilet Details for Transfer Assistance: Mod cues for hand placement and safety.       Exercises      Balance Static Sitting Balance Static Sitting - Balance Support: No upper extremity supported;Feet supported Static Sitting - Level of Assistance: 5: Stand by assistance   End of Session OT - End of Session Activity Tolerance: Patient limited by fatigue Patient left: in bed;with call bell/phone within reach (with PT present.)  GO     Joury Allcorn A OTR/L [phone] 02/07/2012, 3:52 PM

## 2012-02-07 NOTE — Progress Notes (Signed)
Patient ID: [name]  female  WUJ:[id number]    DOB: 26-Mar-1919    DOA: 02/03/2012  PCP: [name], MD  Assessment/Plan: Principal Problem:  Dysarthria/right hemiparesis  - Dr. Isidoro Donning discussed with patient's daughter on 1/11, [name], patient's dysarthria started after Christmas suddenly. She was not brought to the hospital at that time. On examination, right hemiparesis appears to have somewhat improved (? Baseline vs on admission). Per daughter, patient does not have a prior history of stroke. - Swallow evaluation done- advance diet to dysphagia 3 with a liquids - MRI head does not show acute stroke. Unclear etiology of presentation- ? Parkinsons versus neurology ruling out C-spine issues. Continue aspirin. - Dr. Isidoro Donning discussed with neurology, Dr. Roseanne Reno, who recommended continuing aspirin even though no clear indication of stroke on the MRI and patient could have dysarthria due to overall generalized weakness - MRI C-spine shows degenerative cervical spinal stenosis-do not think contributing to her presentation  Active problems: No UTI  UA was obtained prior to antibiotics. Urine culture showed no growth. Discontinue Cipro.   Acute on chronic kidney disease  Secondary to dehydration. Temporarily hold Lasix and IV fluids discontinued.  Creatinine improving.   Hyperkalemia  Likely secondary to acute renal failure. Resolved   Hypertension  Reasonably controlled. Continue Cardizem and carvedilol. Monitor   Type 2 diabetes mellitus: Uncontrolled  Continue Lantus 20 units, placed on meal coverage, change sliding scale to moderate. Fluctuating but reasonable. Hemoglobin A1c 10.3 indicating poor outpatient control  Hypothyroidism  Continue Synthroid.   Feet ulcers  Wound care consulted. Likely secondary to diabetic neuropathy.   Chronic diastolic CHF/CAD  Compensated. Continue Imdur, ASA, Coreg. Hold Lasix until the renal function is stabilized  Anemia Stable  DVT  Prophylaxis: lovenox Code Status: Full Family communication: Discussed with patient Disposition: to be determined. Rehab MD indicates CIR appropriate- await bed.   Subjective:  Patient indicates that her slurred speech is better. Denies any other complaints.   Objective: Weight change:   Intake/Output Summary (Last 24 hours) at 02/07/12 1802 Last data filed at 02/07/12 1735  Gross per 24 hour  Intake   1200 ml  Output    803 ml  Net    397 ml   Blood pressure 120/39, pulse 64, temperature 98.6 F (37 C), temperature source Oral, resp. rate 18, height 5\' 8"  (1.727 m), weight 79.379 kg (175 lb), SpO2 100.00%.  Physical Exam: General: Alert and awake, oriented x3, not in any acute distress, dysarthria-seems better CVS: S1-S2 clear, no murmur rubs or gallops. Telemetry shows sinus rhythm.  Chest: clear to auscultation bilaterally, no wheezing, rales or rhonchi Abdomen: soft nontender, nondistended, normal bowel sounds, no organomegaly Extremities: no cyanosis, clubbing or edema noted bilaterally.? 4+/5 power right upper extremity  Neuro: Dysarthria,  no other cranial nerve deficits.   Lab Results: Basic Metabolic Panel:  Lab 02/07/12 9562 02/05/12 0500  NA 136 136  K 4.2 4.5  CL 101 103  CO2 27 25  GLUCOSE 159* 289*  BUN 18 29*  CREATININE 1.27* 1.69*  CALCIUM 9.0 8.7  MG -- --  PHOS -- --   Liver Function Tests: No results found for this basename: AST:2,ALT:2,ALKPHOS:2,BILITOT:2,PROT:2,ALBUMIN:2 in the last 168 hours No results found for this basename: LIPASE:2,AMYLASE:2 in the last 168 hours No results found for this basename: AMMONIA:2 in the last 168 hours CBC:  Lab 02/07/12 0450 02/05/12 0500 02/03/12 1840  WBC 9.1 7.9 --  NEUTROABS -- -- 10.8*  HGB 11.2*  10.7* --  HCT 34.0* 34.0* --  MCV 87.9 89.7 --  PLT 263 238 --   Cardiac Enzymes:  Lab 02/03/12 1840  CKTOTAL --  CKMB --  CKMBINDEX --  TROPONINI <0.30   BNP: No components found with this  basename: POCBNP:2 CBG:  Lab 02/07/12 1657 02/07/12 1158 02/07/12 0734 02/07/12 0301 02/06/12 2144  GLUCAP 144* 326* 130* 171* 174*     Micro Results: Recent Results (from the past 240 hour(s))  URINE CULTURE     Status: Normal   Collection Time   02/03/12  6:18 PM      Component Value Range Status Comment   Specimen Description URINE, CATHETERIZED   Final    Special Requests NONE   Final    Culture  Setup Time 02/04/2012 02:42   Final    Colony Count NO GROWTH   Final    Culture NO GROWTH   Final    Report Status 02/05/2012 FINAL   Final   CLOSTRIDIUM DIFFICILE BY PCR     Status: Normal   Collection Time   02/05/12  4:05 AM      Component Value Range Status Comment   C difficile by pcr NEGATIVE  NEGATIVE Final     Studies/Results: Dg Chest 2 View  02/04/2012  *RADIOLOGY REPORT*  Clinical Data: Stroke  CHEST - 2 VIEW  Comparison: 12/26/2011  Findings: Mild vascular congestion is present but this has improved.  The heart is mildly enlarged.  Bibasilar atelectasis. No new consolidation or mass.  No Kerley B lines to suggest interstitial edema. Sclerotic changes in the proximal right humerus are stable.  IMPRESSION: Improving vascular congestion.   Original Report Authenticated By: Jolaine Click, M.D.    Ct Head Wo Contrast  02/03/2012  *RADIOLOGY REPORT*  Clinical Data: Slurred speech.  Unsteady gait.  Right-sided weakness.  CT HEAD WITHOUT CONTRAST  Technique:  Contiguous axial images were obtained from the base of the skull through the vertex without contrast.  Comparison: 02/21/2008  Findings: There is no evidence for acute infarction, intracranial hemorrhage, mass lesion, hydrocephalus, or extra-axial fluid. Moderate to severe atrophy is present.  Advanced chronic microvascular ischemic change is present in the periventricular and subcortical white matter.  Chronic hypodensity left insular cortex.  No CT signs of proximal vascular thrombosis in the left MCA territory.  No evidence for  chronic subdural hematoma.  Calvarium intact.  Advanced carotid and vertebral atherosclerosis.  Clear sinuses and mastoids.  IMPRESSION: Stable exam.  Atrophy and chronic microvascular ischemic change without visible acute stroke or hemorrhage.   Original Report Authenticated By: Davonna Belling, M.D.    Mr [facility] Wo Contrast  02/04/2012  *RADIOLOGY REPORT*  Clinical Data:  Diabetic hypertensive with chronic renal disease presenting with right-sided weakness and difficulty speaking.  MRI BRAIN WITHOUT CONTRAST MRA HEAD WITHOUT CONTRAST  Technique: Multiplanar, multiecho pulse sequences of the brain and surrounding structures were obtained according to standard protocol without intravenous contrast.  Angiographic images of the head were obtained using MRA technique without contrast.  Comparison: 02/03/2012 head CT.  No comparison brain MR.  MRI HEAD  Findings:  No acute infarct.  No intracranial hemorrhage.  Small vessel disease type changes.  Global atrophy.  Ventricular prominence felt to be related to atrophy rather hydrocephalus.  No intracranial mass lesion detected on this unenhanced exam.  Partial opacification inferior aspect left mastoid air cells.  No obstructing lesion posterior-superior nasopharynx.  Paranasal sinus mild mucosal thickening.  Cervical spondylotic changes  with spinal stenosis most prominent C2- 3.  Transverse ligament hypertrophy.  Major intracranial vascular structures are patent.  Cervical medullary junction, pituitary region, pineal region and orbital structures unremarkable.  IMPRESSION: No acute infarct.  Please see above.  MRA HEAD  Findings: Mild irregularity of the cavernous segment of the internal carotid artery bilaterally.  Anterior circulation without medium or large size vessel significant stenosis or occlusion.  Middle cerebral artery and A2 segment anterior cerebral artery mild irregularity bilaterally.  Left vertebral artery is dominant size.  Mild narrowing portions of the  right vertebral artery.  Nonvisualization right PICA.  Mild irregularity of the basilar artery without high-grade stenosis.  Small left AICA.  Moderate narrowing proximal right and distal left superior cerebellar artery.  High-grade focal stenosis of the right posterior cerebral artery P1- 2 junction.  Marked narrowing distal right posterior cerebral artery superior branch.  No aneurysm noted.  IMPRESSION: Intracranial atherosclerotic type changes most notable involving the right posterior cerebral artery as detailed above.   Original Report Authenticated By: Lacy Duverney, M.D.    Mr Brain Wo Contrast  02/04/2012  *RADIOLOGY REPORT*  Clinical Data:  Diabetic hypertensive with chronic renal disease presenting with right-sided weakness and difficulty speaking.  MRI BRAIN WITHOUT CONTRAST MRA HEAD WITHOUT CONTRAST  Technique: Multiplanar, multiecho pulse sequences of the brain and surrounding structures were obtained according to standard protocol without intravenous contrast.  Angiographic images of the head were obtained using MRA technique without contrast.  Comparison: 02/03/2012 head CT.  No comparison brain MR.  MRI HEAD  Findings:  No acute infarct.  No intracranial hemorrhage.  Small vessel disease type changes.  Global atrophy.  Ventricular prominence felt to be related to atrophy rather hydrocephalus.  No intracranial mass lesion detected on this unenhanced exam.  Partial opacification inferior aspect left mastoid air cells.  No obstructing lesion posterior-superior nasopharynx.  Paranasal sinus mild mucosal thickening.  Cervical spondylotic changes with spinal stenosis most prominent C2- 3.  Transverse ligament hypertrophy.  Major intracranial vascular structures are patent.  Cervical medullary junction, pituitary region, pineal region and orbital structures unremarkable.  IMPRESSION: No acute infarct.  Please see above.  MRA HEAD  Findings: Mild irregularity of the cavernous segment of the internal  carotid artery bilaterally.  Anterior circulation without medium or large size vessel significant stenosis or occlusion.  Middle cerebral artery and A2 segment anterior cerebral artery mild irregularity bilaterally.  Left vertebral artery is dominant size.  Mild narrowing portions of the right vertebral artery.  Nonvisualization right PICA.  Mild irregularity of the basilar artery without high-grade stenosis.  Small left AICA.  Moderate narrowing proximal right and distal left superior cerebellar artery.  High-grade focal stenosis of the right posterior cerebral artery P1- 2 junction.  Marked narrowing distal right posterior cerebral artery superior branch.  No aneurysm noted.  IMPRESSION: Intracranial atherosclerotic type changes most notable involving the right posterior cerebral artery as detailed above.   Original Report Authenticated By: Lacy Duverney, M.D.     Medications: Scheduled Meds:    . aspirin  300 mg Rectal Daily   Or  . aspirin  325 mg Oral Daily  . calcium carbonate  1 tablet Oral Daily  . carvedilol  6.25 mg Oral BID WC  . diltiazem  180 mg Oral QHS  . enoxaparin  30 mg Subcutaneous Q24H  . gabapentin  300 mg Oral BID  . insulin aspart  0-15 Units Subcutaneous TID WC  . insulin aspart  0-5 Units Subcutaneous  QHS  . insulin aspart  4 Units Subcutaneous TID WC  . insulin glargine  20 Units Subcutaneous QHS  . isosorbide mononitrate  30 mg Oral Daily  . levothyroxine  200 mcg Oral QAC breakfast  . levothyroxine  25 mcg Oral QAC breakfast  . multivitamin with minerals  1 tablet Oral Daily  . neomycin-bacitracin-polymyxin   Topical Daily  . pantoprazole  40 mg Oral Daily  . Tamsulosin HCl  0.4 mg Oral q morning - 10a  . traZODone  50 mg Oral QHS      LOS: 4 days   [facility] M.D. [facility] 02/07/2012, 6:02 PM Pager: [phone]  If 7PM-7AM, please contact night-coverage [email or web address] Password [email or web address]

## 2012-02-07 NOTE — Progress Notes (Signed)
Rehab Admissions Coordinator Note:  Patient was screened by Clois Dupes for appropriateness for an Inpatient Acute Rehab Consult.   Rehab consult is pending today. Melanee Spry will follow up [phone].  Clois Dupes, RN 02/07/2012, 8:21 AM  I can be reached at [phone].

## 2012-02-08 ENCOUNTER — Encounter (HOSPITAL_COMMUNITY): Payer: Self-pay | Admitting: *Deleted

## 2012-02-08 ENCOUNTER — Inpatient Hospital Stay (HOSPITAL_COMMUNITY)
Admission: AD | Admit: 2012-02-08 | Discharge: 2012-02-17 | DRG: 945 | Disposition: A | Discharge status: Home Health | Payer: Medicare Other | Source: Intra-hospital | Attending: Physical Medicine & Rehabilitation | Admitting: Physical Medicine & Rehabilitation

## 2012-02-08 DIAGNOSIS — I509 Heart failure, unspecified: Secondary | ICD-10-CM

## 2012-02-08 DIAGNOSIS — E1149 Type 2 diabetes mellitus with other diabetic neurological complication: Secondary | ICD-10-CM | POA: Diagnosis present

## 2012-02-08 DIAGNOSIS — L97509 Non-pressure chronic ulcer of other part of unspecified foot with unspecified severity: Secondary | ICD-10-CM

## 2012-02-08 DIAGNOSIS — E1169 Type 2 diabetes mellitus with other specified complication: Secondary | ICD-10-CM

## 2012-02-08 DIAGNOSIS — Z79899 Other long term (current) drug therapy: Secondary | ICD-10-CM

## 2012-02-08 DIAGNOSIS — M47812 Spondylosis without myelopathy or radiculopathy, cervical region: Secondary | ICD-10-CM

## 2012-02-08 DIAGNOSIS — R269 Unspecified abnormalities of gait and mobility: Secondary | ICD-10-CM

## 2012-02-08 DIAGNOSIS — R29898 Other symptoms and signs involving the musculoskeletal system: Secondary | ICD-10-CM

## 2012-02-08 DIAGNOSIS — I2510 Atherosclerotic heart disease of native coronary artery without angina pectoris: Secondary | ICD-10-CM

## 2012-02-08 DIAGNOSIS — Z9181 History of falling: Secondary | ICD-10-CM

## 2012-02-08 DIAGNOSIS — E1142 Type 2 diabetes mellitus with diabetic polyneuropathy: Secondary | ICD-10-CM

## 2012-02-08 DIAGNOSIS — I10 Essential (primary) hypertension: Secondary | ICD-10-CM | POA: Diagnosis present

## 2012-02-08 DIAGNOSIS — Z5189 Encounter for other specified aftercare: Principal | ICD-10-CM

## 2012-02-08 DIAGNOSIS — I129 Hypertensive chronic kidney disease with stage 1 through stage 4 chronic kidney disease, or unspecified chronic kidney disease: Secondary | ICD-10-CM

## 2012-02-08 DIAGNOSIS — R5381 Other malaise: Secondary | ICD-10-CM

## 2012-02-08 DIAGNOSIS — N189 Chronic kidney disease, unspecified: Secondary | ICD-10-CM

## 2012-02-08 DIAGNOSIS — R32 Unspecified urinary incontinence: Secondary | ICD-10-CM

## 2012-02-08 DIAGNOSIS — Z87891 Personal history of nicotine dependence: Secondary | ICD-10-CM

## 2012-02-08 DIAGNOSIS — I5032 Chronic diastolic (congestive) heart failure: Secondary | ICD-10-CM

## 2012-02-08 DIAGNOSIS — K599 Functional intestinal disorder, unspecified: Secondary | ICD-10-CM

## 2012-02-08 DIAGNOSIS — E039 Hypothyroidism, unspecified: Secondary | ICD-10-CM | POA: Diagnosis present

## 2012-02-08 DIAGNOSIS — N179 Acute kidney failure, unspecified: Secondary | ICD-10-CM | POA: Diagnosis present

## 2012-02-08 DIAGNOSIS — Z794 Long term (current) use of insulin: Secondary | ICD-10-CM

## 2012-02-08 DIAGNOSIS — Z7982 Long term (current) use of aspirin: Secondary | ICD-10-CM

## 2012-02-08 DIAGNOSIS — E860 Dehydration: Secondary | ICD-10-CM

## 2012-02-08 DIAGNOSIS — I252 Old myocardial infarction: Secondary | ICD-10-CM

## 2012-02-08 DIAGNOSIS — I739 Peripheral vascular disease, unspecified: Secondary | ICD-10-CM

## 2012-02-08 DIAGNOSIS — R2689 Other abnormalities of gait and mobility: Secondary | ICD-10-CM

## 2012-02-08 DIAGNOSIS — N3289 Other specified disorders of bladder: Secondary | ICD-10-CM

## 2012-02-08 DIAGNOSIS — R471 Dysarthria and anarthria: Secondary | ICD-10-CM

## 2012-02-08 DIAGNOSIS — IMO0001 Reserved for inherently not codable concepts without codable children: Secondary | ICD-10-CM | POA: Diagnosis present

## 2012-02-08 LAB — BASIC METABOLIC PANEL
BUN: 18 mg/dL (ref 6–23)
CO2: 28 mEq/L (ref 19–32)
Chloride: 102 mEq/L (ref 96–112)
Creatinine, Ser: 1.38 mg/dL — ABNORMAL HIGH (ref 0.50–1.10)

## 2012-02-08 LAB — GLUCOSE, CAPILLARY: Glucose-Capillary: 194 mg/dL — ABNORMAL HIGH (ref 70–99)

## 2012-02-08 MED ORDER — CARVEDILOL 6.25 MG PO TABS
6.2500 mg | ORAL_TABLET | Freq: Two times a day (BID) | ORAL | Status: DC
Start: 2012-02-08 — End: 2012-02-17
  Administered 2012-02-08 – 2012-02-17 (×18): 6.25 mg via ORAL
  Filled 2012-02-08 (×20): qty 1

## 2012-02-08 MED ORDER — PROCHLORPERAZINE EDISYLATE 5 MG/ML IJ SOLN
5.0000 mg | Freq: Four times a day (QID) | INTRAMUSCULAR | Status: DC | PRN
  Filled 2012-02-08: qty 2

## 2012-02-08 MED ORDER — TRAZODONE HCL 50 MG PO TABS
25.0000 mg | ORAL_TABLET | Freq: Every evening | ORAL | Status: DC | PRN
  Administered 2012-02-13: 50 mg via ORAL
  Filled 2012-02-08 (×2): qty 1

## 2012-02-08 MED ORDER — INSULIN ASPART 100 UNIT/ML ~~LOC~~ SOLN: 0.0000 [IU] | Freq: Every day | SUBCUTANEOUS | Status: DC

## 2012-02-08 MED ORDER — SACCHAROMYCES BOULARDII 250 MG PO CAPS
250.0000 mg | ORAL_CAPSULE | Freq: Two times a day (BID) | ORAL | Status: DC
  Administered 2012-02-08 – 2012-02-16 (×16): 250 mg via ORAL
  Filled 2012-02-08 (×18): qty 1

## 2012-02-08 MED ORDER — ISOSORBIDE MONONITRATE ER 30 MG PO TB24
30.0000 mg | ORAL_TABLET | Freq: Every day | ORAL | Status: DC
  Administered 2012-02-09 – 2012-02-17 (×9): 30 mg via ORAL
  Filled 2012-02-08 (×11): qty 1

## 2012-02-08 MED ORDER — BACITRACIN-NEOMYCIN-POLYMYXIN 400-5-5000 EX OINT: TOPICAL_OINTMENT | CUTANEOUS | Status: DC

## 2012-02-08 MED ORDER — PROCHLORPERAZINE MALEATE 5 MG PO TABS
5.0000 mg | ORAL_TABLET | Freq: Four times a day (QID) | ORAL | Status: DC | PRN
  Filled 2012-02-08: qty 2

## 2012-02-08 MED ORDER — FLEET ENEMA 7-19 GM/118ML RE ENEM
1.0000 | ENEMA | Freq: Once | RECTAL | Status: AC | PRN
  Filled 2012-02-08: qty 1

## 2012-02-08 MED ORDER — LEVOTHYROXINE SODIUM 25 MCG PO TABS
25.0000 ug | ORAL_TABLET | Freq: Every day | ORAL | Status: DC
  Administered 2012-02-09 – 2012-02-17 (×9): 25 ug via ORAL
  Filled 2012-02-08 (×10): qty 1

## 2012-02-08 MED ORDER — DILTIAZEM HCL ER COATED BEADS 180 MG PO CP24
180.0000 mg | ORAL_CAPSULE | Freq: Every day | ORAL | Status: DC
  Administered 2012-02-08 – 2012-02-16 (×9): 180 mg via ORAL
  Filled 2012-02-08 (×11): qty 1

## 2012-02-08 MED ORDER — TRAZODONE HCL 50 MG PO TABS
50.0000 mg | ORAL_TABLET | Freq: Every day | ORAL | Status: DC
  Administered 2012-02-08: 50 mg via ORAL
  Filled 2012-02-08: qty 1

## 2012-02-08 MED ORDER — GUAIFENESIN-DM 100-10 MG/5ML PO SYRP: 5.0000 mL | ORAL_SOLUTION | Freq: Four times a day (QID) | ORAL | Status: DC | PRN

## 2012-02-08 MED ORDER — ACETAMINOPHEN 325 MG PO TABS
325.0000 mg | ORAL_TABLET | ORAL | Status: DC | PRN
  Administered 2012-02-11: 650 mg via ORAL
  Filled 2012-02-08: qty 2

## 2012-02-08 MED ORDER — INSULIN GLARGINE 100 UNIT/ML ~~LOC~~ SOLN
20.0000 [IU] | Freq: Every day | SUBCUTANEOUS | Status: DC
  Administered 2012-02-08 – 2012-02-10 (×3): 20 [IU] via SUBCUTANEOUS

## 2012-02-08 MED ORDER — LEVOTHYROXINE SODIUM 200 MCG PO TABS
200.0000 ug | ORAL_TABLET | Freq: Every day | ORAL | Status: DC
  Administered 2012-02-09 – 2012-02-17 (×9): 200 ug via ORAL
  Filled 2012-02-08 (×10): qty 1

## 2012-02-08 MED ORDER — INSULIN ASPART 100 UNIT/ML ~~LOC~~ SOLN: 4.0000 [IU] | Freq: Three times a day (TID) | SUBCUTANEOUS | Status: DC

## 2012-02-08 MED ORDER — INSULIN ASPART 100 UNIT/ML ~~LOC~~ SOLN
0.0000 [IU] | Freq: Three times a day (TID) | SUBCUTANEOUS | Status: DC
  Administered 2012-02-08 – 2012-02-09 (×3): 2 [IU] via SUBCUTANEOUS
  Administered 2012-02-09: 8 [IU] via SUBCUTANEOUS
  Administered 2012-02-10: 2 [IU] via SUBCUTANEOUS
  Administered 2012-02-10: 8 [IU] via SUBCUTANEOUS
  Administered 2012-02-10 – 2012-02-11 (×2): 3 [IU] via SUBCUTANEOUS
  Administered 2012-02-11: 5 [IU] via SUBCUTANEOUS

## 2012-02-08 MED ORDER — BACITRACIN-NEOMYCIN-POLYMYXIN 400-5-5000 EX OINT
TOPICAL_OINTMENT | Freq: Every day | CUTANEOUS | Status: DC
  Filled 2012-02-08: qty 1

## 2012-02-08 MED ORDER — ASPIRIN 300 MG RE SUPP
300.0000 mg | Freq: Every day | RECTAL | Status: DC
  Filled 2012-02-08 (×10): qty 1

## 2012-02-08 MED ORDER — ALUM & MAG HYDROXIDE-SIMETH 200-200-20 MG/5ML PO SUSP: 30.0000 mL | ORAL | Status: DC | PRN

## 2012-02-08 MED ORDER — ASPIRIN 325 MG PO TABS
325.0000 mg | ORAL_TABLET | Freq: Every day | ORAL | Status: DC
  Administered 2012-02-09 – 2012-02-17 (×9): 325 mg via ORAL
  Filled 2012-02-08 (×10): qty 1

## 2012-02-08 MED ORDER — ENOXAPARIN SODIUM 30 MG/0.3ML ~~LOC~~ SOLN
30.0000 mg | SUBCUTANEOUS | Status: DC
  Administered 2012-02-08 – 2012-02-16 (×9): 30 mg via SUBCUTANEOUS
  Filled 2012-02-08 (×10): qty 0.3

## 2012-02-08 MED ORDER — TAMSULOSIN HCL 0.4 MG PO CAPS
0.4000 mg | ORAL_CAPSULE | Freq: Every morning | ORAL | Status: DC
  Filled 2012-02-08: qty 1

## 2012-02-08 MED ORDER — SILVER SULFADIAZINE 1 % EX CREA
TOPICAL_CREAM | Freq: Every day | CUTANEOUS | Status: DC
  Administered 2012-02-09 – 2012-02-17 (×8): via TOPICAL
  Filled 2012-02-08: qty 85

## 2012-02-08 MED ORDER — INSULIN GLARGINE 100 UNIT/ML ~~LOC~~ SOLN: 20.0000 [IU] | Freq: Every day | SUBCUTANEOUS | Status: DC

## 2012-02-08 MED ORDER — BACITRACIN-NEOMYCIN-POLYMYXIN OINTMENT TUBE
TOPICAL_OINTMENT | Freq: Every day | CUTANEOUS | Status: DC
  Administered 2012-02-09 – 2012-02-14 (×5): via TOPICAL
  Filled 2012-02-08: qty 15

## 2012-02-08 MED ORDER — BISACODYL 10 MG RE SUPP: 10.0000 mg | Freq: Every day | RECTAL | Status: DC | PRN

## 2012-02-08 MED ORDER — PROCHLORPERAZINE 25 MG RE SUPP
12.5000 mg | Freq: Four times a day (QID) | RECTAL | Status: DC | PRN
  Filled 2012-02-08: qty 1

## 2012-02-08 MED ORDER — ADULT MULTIVITAMIN W/MINERALS CH
1.0000 | ORAL_TABLET | Freq: Every day | ORAL | Status: DC
  Administered 2012-02-09 – 2012-02-17 (×9): 1 via ORAL
  Filled 2012-02-08 (×10): qty 1

## 2012-02-08 MED ORDER — INSULIN ASPART 100 UNIT/ML ~~LOC~~ SOLN
4.0000 [IU] | Freq: Three times a day (TID) | SUBCUTANEOUS | Status: DC
  Administered 2012-02-08 – 2012-02-12 (×11): 4 [IU] via SUBCUTANEOUS

## 2012-02-08 MED ORDER — PANTOPRAZOLE SODIUM 40 MG PO TBEC
40.0000 mg | DELAYED_RELEASE_TABLET | Freq: Every day | ORAL | Status: DC
  Administered 2012-02-09 – 2012-02-17 (×9): 40 mg via ORAL
  Filled 2012-02-08 (×10): qty 1

## 2012-02-08 MED ORDER — CALCIUM CARBONATE 1250 (500 CA) MG PO TABS
1.0000 | ORAL_TABLET | Freq: Every day | ORAL | Status: DC
  Administered 2012-02-09 – 2012-02-17 (×9): 500 mg via ORAL
  Filled 2012-02-08 (×10): qty 1

## 2012-02-08 MED ORDER — GABAPENTIN 300 MG PO CAPS
300.0000 mg | ORAL_CAPSULE | Freq: Two times a day (BID) | ORAL | Status: DC
  Administered 2012-02-08 – 2012-02-09 (×2): 300 mg via ORAL
  Filled 2012-02-08 (×5): qty 1

## 2012-02-08 MED ORDER — ASPIRIN EC 325 MG PO TBEC: 325.0000 mg | DELAYED_RELEASE_TABLET | Freq: Every day | ORAL | Status: DC

## 2012-02-08 NOTE — Plan of Care (Signed)
Overall Plan of Care [facility]) Patient Details Name: [name] MRN: [id number] DOB: Jul 06, 1919  Diagnosis:  Rehabilitation for deconditioning  Primary Diagnosis:    Multifactorial gait disorder Co-morbidities: Myocardial infarction  .  Coronary artery disease  .  Hypertension  .  Dysrhythmia  .  Peripheral vascular disease  .  Hypothyroidism  .  Diabetes mellitus  .  Presbyesophagus  04/23/2011  .  Urinary retention with incomplete bladder emptying  04/23/2011  .  UTI (lower urinary tract infection)  04/21/2011  .  Diastolic CHF, chronic    Functional Problem List  Patient demonstrates impairments in the following areas: Balance, Bladder, Bowel, Edema, Endurance, Linguistic, Medication Management, Motor, Pain, Perception, Safety, Sensory , Skin Integrity and Vision  Basic ADL's: grooming, bathing, dressing and toileting Advanced ADL's: simple meal preparation  Transfers:  bed mobility, bed to chair, toilet, tub/shower, car and furniture Locomotion:  ambulation and stairs  Additional Impairments:  Swallowing  Anticipated Outcomes Item Anticipated Outcome  Eating/Swallowing    Basic self-care  Modified independent  Tolieting   modified independent  Bowel/Bladder  Remain continent of bowel/bladder with timed toileting  Transfers  Modified basic and furniture; supervision car  Locomotion  Gait - Modified independent x 150' controlled env, 50 ' home env Supervision 5 steps  Communication    Cognition    Pain  2 or less on scale 0-10  Safety/Judgment    Other     Therapy Plan: PT Intensity: Minimum of 1-2 x/day ,45 to 90 minutes PT Frequency: 5 out of 7 days PT Duration Estimated Length of Stay: 7-10 days OT Intensity: Minimum of 1-2 x/day, 45 to 90 minutes OT Frequency: 5 out of 7 days OT Duration/Estimated Length of Stay: 10-14 days SLP Intensity: Minumum of 1-2 x/day, 30 to 90 minutes SLP Frequency: 3 out of 7 days SLP Duration/Estimated  Length of Stay: anticipated discharge 77/16/14    Team Interventions: Item RN PT OT SLP SW TR Other  Self Care/Advanced ADL Retraining   x      Neuromuscular Re-Education  x x      Therapeutic Activities  x x      UE/LE Strength Training/ROM  x x      UE/LE Coordination Activities  x x      Visual/Perceptual Remediation/Compensation         DME/Adaptive Equipment Instruction  x x      Therapeutic Exercise  x x      Balance/Vestibular Training  x x      Patient/Family Education x x x x     Cognitive Remediation/Compensation   x      Functional Mobility Training  x x      Ambulation/Gait Training  x       Museum/gallery curator  x       Wheelchair Propulsion/Positioning  x       Functional Tourist information centre manager Reintegration   x      Dysphagia/Aspiration Precaution Training    x     Speech/Language Facilitation         Bladder Management x        Bowel Management x        Disease Management/Prevention         Pain Management x        Medication Management x        Skin Care/Wound Management x        Splinting/Orthotics  x       Discharge Planning  x x x     Psychosocial Support  x x                             Team Discharge Planning: Destination: PT-Home ,OT- Home , SLP-Home Projected Follow-up: PT-Home health PT;Outpatient PT, OT-  Home health OT (depending on progress), SLP-24 hour supervision/assistance Projected Equipment Needs: PT- TBD, ?Rolling walker with 5" wheels, OT- 3 in 1 bedside comode, SLP-None recommended by SLP Patient/family involved in discharge planning: PT- Patient,  OT-Patient, SLP-Patient  MD ELOS: 7 days Medical Rehab Prognosis:  Good Assessment: 79 year old female admitted for dehydration and weakness. No acute cause for weakness identified. Now requiring 24 7 rehabilitation RN and M.D. As well as CIR level PT and OT. Team will dress ADLs, cognitive perceptual skills, strengthening, endurance, equipment, range of motion, safety, bowel  bladder issues, skin integrity.    See Team Conference Notes for weekly updates to the plan of care

## 2012-02-08 NOTE — Progress Notes (Signed)
Physical Therapy Treatment Patient Details Name: MICAH GALENO MRN: [id number] DOB: Nov 10, 1919 Today's Date: 02/08/2012 Time: 3086-5784 PT Time Calculation (min): 27 min  PT Assessment / Plan / Recommendation Comments on Treatment Session  Pt ambulated in hallway and performed a couple high level balance activities while ambulating.  Pt also performed exercises in recliner.    Follow Up Recommendations  CIR     Does the patient have the potential to tolerate intense rehabilitation     Barriers to Discharge        Equipment Recommendations  None recommended by PT    Recommendations for Other Services    Frequency     Plan Discharge plan remains appropriate;Frequency remains appropriate    Precautions / Restrictions Precautions Precautions: Fall Restrictions Weight Bearing Restrictions: No   Pertinent Vitals/Pain No pain    Mobility  Bed Mobility Bed Mobility: Supine to Sit Supine to Sit: 5: Supervision;HOB elevated Transfers Transfers: Stand to Sit;Sit to Stand Sit to Stand: 4: Min assist;With upper extremity assist;From bed;From chair/3-in-1 Stand to Sit: 4: Min assist;With upper extremity assist;To chair/3-in-1 Details for Transfer Assistance: verbal cues for safe technique, assist to rise and control descent Ambulation/Gait Ambulation/Gait Assistance: 4: Min guard Ambulation Distance (Feet): 120 Feet Assistive device: Rolling walker Ambulation/Gait Assistance Details: pt ambulated 120 feet with verbal cues for increasing BOS and safe RW distance Gait Pattern: Step-through pattern;Decreased stride length;Trunk flexed;Narrow base of support    Exercises General Exercises - Lower Extremity Ankle Circles/Pumps: AROM;Both;15 reps;Seated Long Arc Quad: AROM;Both;15 reps;Seated Hip Flexion/Marching: AROM;Both;15 reps;Seated Mini-Sqauts: Other (comment);Strengthening;Both;5 reps;Limitations Mini Squats Limitations: pt performed sit to stands with verbal cues for  instruction and required less assist each rep   PT Diagnosis:    PT Problem List:   PT Treatment Interventions:     PT Goals Acute Rehab PT Goals PT Goal: Supine/Side to Sit - Progress: Met PT Goal: Sit to Stand - Progress: Progressing toward goal PT Goal: Stand to Sit - Progress: Progressing toward goal PT Goal: Ambulate - Progress: Progressing toward goal  Visit Information  Last PT Received On: 02/08/12 Assistance Needed: +1    Subjective Data  Subjective: I need to find my lipstick.   Cognition  Overall Cognitive Status: Appears within functional limits for tasks assessed/performed Arousal/Alertness: Awake/alert Orientation Level: Appears intact for tasks assessed Behavior During Session: [facility] for tasks performed    Balance  High Level Balance High Level Balance Activites: Head turns High Level Balance Comments: pt performed head turns with min assist to guide RW for 60 feet and then marching with RW for 60 feet  End of Session PT - End of Session Equipment Utilized During Treatment: Gait belt Activity Tolerance: Patient tolerated treatment well Patient left: in chair;with call bell/phone within reach;with chair alarm set   GP     Nashaly Dorantes,[name] 02/08/2012, 11:50 AM Pager: [phone]

## 2012-02-08 NOTE — Progress Notes (Signed)
Discharge to CIR, report given to [place], Charity fundraiser. Patient alert and oriented but still having dysarthria. PIV removed no s/s of infiltration or swelling. [facility]  P/u patient.

## 2012-02-08 NOTE — H&P (Signed)
Physical Medicine and Rehabilitation Admission H&P  Chief Complaint   Patient presents with   .  right sided weakness and dysarthria   :  HPI: [name] is a 79 y.o. female with history of DM, chronic diarrhes, gait disorder with multiple falls, who was admitted 02/03/12 with increasing weakness, speech difficulties with right sided weakness that started the day after Christmas. Patient with history of mild right sided weakness for approximately two months and waxing and waning of dysarthria. She was noted to have acute on chronic renal failure with BUN 38/Cr 1.92 and was started on IVF for hydration and diuretics discontinued. MRI/MRA brain without acute changes. Neurology consulted and MRI C-spine with multifactorial cervical stenosis from C2/C3 to C6/C7 with moderate to severe foraminal stenosis. Speech and strength have improved since admission. MBS done 1/11 due to history of prior dysphagia and patient placed on D3, thin liquids. UCS negative. Patient with history of chronic diarrhea--C-diff check 02/05/11 was negative. Neurology recommended ASA and ongoing therapy for weakness of ?etiology. Renal status improved. Therapies ongoing and CIR level therapies recommended.  Review of Systems  HENT: Positive for hearing loss.  Eyes: Negative for blurred vision and double vision.  Respiratory: Negative for cough and shortness of breath.  Cardiovascular: Positive for leg swelling (chronic). Negative for chest pain and palpitations.  Gastrointestinal: Positive for diarrhea (decrease in stooling. ). Negative for heartburn and nausea.  Musculoskeletal: Positive for myalgias.  Neurological: Positive for sensory change (numbness and tingling L>R foot) and speech change (clarity fluctuates). Negative for headaches.   Past Medical History   Diagnosis  Date   .  Myocardial infarction    .  Coronary artery disease    .  Hypertension    .  Dysrhythmia    .  Peripheral vascular disease    .   Hypothyroidism    .  Diabetes mellitus    .  Presbyesophagus  04/23/2011   .  Urinary retention with incomplete bladder emptying  04/23/2011   .  UTI (lower urinary tract infection)  04/21/2011   .  Diastolic CHF, chronic      Grade I diastolic dysfunction on Echo 04/26/11   .  Anaphylaxis  04/25/2011     Thought to be from Rocephin    Past Surgical History   Procedure  Date   .  Cholecystectomy    .  Colonoscopy  01/15/2011     Procedure: COLONOSCOPY; Surgeon: Theda Belfast, MD; Location: [facility]; Service: Endoscopy; Laterality: N/A;    Family History   Problem  Relation  Age of Onset   .  Cancer  Sister    .  Diabetes  Mother    .  Heart disease  Father     Social History: Lives alone. Has an aide 4 hours/day to help with "cats, housework". Daughter does the cooking. 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 She reports that she quit smoking about 30 years ago. Her smoking use included Cigarettes. She has never used smokeless tobacco. She reports that she drinks alcohol. She reports that she does not use illicit drugs.  Allergies   Allergen  Reactions   .  Ephedrine    .  Morphine  Other (See Comments)     Difficulty waking up & talking after surgery   .  Rocephin (Ceftriaxone Sodium In Dextrose)      Developed acute resp distress, wheezing after 2nd dose.    Medications Prior to Admission  Medication  Sig  Dispense  Refill   .  aspirin 81 MG chewable tablet  Chew 324 mg by mouth once.     .  calcium carbonate (OS-CAL - DOSED IN MG OF ELEMENTAL CALCIUM) 1250 MG tablet  Take 1 tablet by mouth daily.     .  carvedilol (COREG) 6.25 MG tablet  Take 6.25 mg by mouth 2 (two) times daily with a meal.     .  diltiazem (CARDIZEM CD) 180 MG 24 hr capsule  Take 180 mg by mouth at bedtime.     .  furosemide (LASIX) 20 MG tablet  Take 20 mg by mouth 2 (two) times daily.     Marland Kitchen  gabapentin (NEURONTIN) 300 MG capsule  Take 300 mg by mouth 2 (two) times daily.     .  insulin  glargine (LANTUS) 100 UNIT/ML injection  Inject 35 Units into the skin at bedtime.     .  insulin lispro (HUMALOG) 100 UNIT/ML injection  Inject 14 Units into the skin 3 (three) times daily as needed. Depends on blood sugar     .  isosorbide mononitrate (IMDUR) 30 MG 24 hr tablet  Take 30 mg by mouth daily.     Marland Kitchen  levothyroxine (SYNTHROID, LEVOTHROID) 200 MCG tablet  Take 200 mcg by mouth every morning. Takes along with 25mg  tablet together     .  levothyroxine (SYNTHROID, LEVOTHROID) 25 MCG tablet  Take 25 mcg by mouth every morning. Takes along with 200mg  tablet together     .  Multiple Vitamin (MULITIVITAMIN WITH MINERALS) TABS  Take 1 tablet by mouth daily.     .  pantoprazole (PROTONIX) 40 MG tablet  Take 1 tablet (40 mg total) by mouth daily.  30 tablet  0   .  Tamsulosin HCl (FLOMAX) 0.4 MG CAPS  Take 0.4 mg by mouth every morning.     .  traZODone (DESYREL) 50 MG tablet  Take 1 tablet (50 mg total) by mouth at bedtime.  30 tablet  0    Home:  Home Living  Lives With: Alone  Available Help at Discharge: Personal care attendant (per dtr, 9am-1pm only 4 hrs)  Type of Home: House  Home Access: Stairs to enter  [employer] of Steps: 2  Entrance Stairs-Rails: Can reach both  Home Layout: One level  Home Adaptive Equipment: Walker - rolling  Functional History:  Prior Function  Able to Take Stairs?: Yes  Driving: No  Vocation: Retired  Functional Status:  Mobility:  Bed Mobility  Bed Mobility: Sit to Supine  Rolling Right: 4: Min assist;With rail  Rolling Left: 4: Min assist;With rail  Supine to Sit: 1: +2 Total assist  Supine to Sit: Patient Percentage: 60%  Sit to Supine: 4: Min assist;With rail;HOB flat  Transfers  Transfers: Sit to Stand;Stand to Sit  Sit to Stand: 4: Min assist;With upper extremity assist;From chair/3-in-1;From toilet  Stand to Sit: 4: Min assist;With upper extremity assist;To bed;To toilet  Ambulation/Gait  Ambulation/Gait Assistance: 4: Min  assist  Ambulation Distance (Feet): 80 Feet  Assistive device: Rolling walker  Ambulation/Gait Assistance Details: assist for LOB x1, pt fatigues quickly, manual assist for negotiating RW as well  Gait Pattern: Step-through pattern;Decreased stride length  Gait velocity: decreased  General Gait Details: pt noted to have more diffculty with heel strike and swing though as she fatigues  Stairs: No  Wheelchair Mobility  Wheelchair Mobility: No  ADL:  ADL  Grooming: Brushing hair;Maximal  assistance (2* lethargy)  Where Assessed - Grooming: Unsupported sitting  Upper Body Bathing: Minimal assistance  Where Assessed - Upper Body Bathing: Unsupported sitting  Lower Body Bathing: Maximal assistance  Where Assessed - Lower Body Bathing: Supported sit to stand  Upper Body Dressing: Minimal assistance  Where Assessed - Upper Body Dressing: Unsupported sitting  Lower Body Dressing: Maximal assistance  Where Assessed - Lower Body Dressing: Unsupported sitting (to don socks (pt too lethargic.))  Toilet Transfer: Minimal assistance  Toilet Transfer Method: Sit to stand  Toilet Transfer Equipment: Comfort height toilet;Grab bars  Equipment Used: Rolling walker  Transfers/Ambulation Related to ADLs: Pt ambulated to the bathroom with min A after several minutes of trying to arouse pt. Max cueing for safety manipulating RW in the bathroom and around obstacles.  ADL Comments: Pt completed SPT from chair>bsc>chair. MAX cues for safety and hand placement. Pt has tendency to abandon RW before reaching destination. Pt required A to wipe back peri area following toileting and for thoroughness when cleaning dentures.  Cognition:  Cognition  Overall Cognitive Status: Appears within functional limits for tasks assessed (suspect at baseline)  Arousal/Alertness: Lethargic  Orientation Level: Oriented X4  Attention: Focused;Sustained  Focused Attention: Appears intact  Sustained Attention: Appears intact    Memory: Impaired  Memory Impairment: Decreased recall of new information;Decreased short term memory (suspect baseline)  Decreased Short Term Memory: Verbal complex;Functional complex  Awareness: Appears intact  Problem Solving: Appears intact (for basic ADLs)  Behaviors: Impulsive (with self feeding)  Safety/Judgment: Appears intact  Cognition  Overall Cognitive Status: Difficult to assess  Difficult to assess due to: Level of arousal  Arousal/Alertness: Lethargic  Orientation Level: Disoriented to;Time  Behavior During Session: Lethargic  Cognition - Other Comments: Pt did start to become more and more alert as session progressed.  Blood pressure 151/49, pulse 65, temperature 98.8 F (37.1 C), temperature source Oral, resp. rate 18, height 5\' 8"  (1.727 m), weight 79.379 kg (175 lb), SpO2 98.00%.  Physical Exam  Nursing note and vitals reviewed.  Constitutional: She is oriented to person, place, and time. She appears well-developed and well-nourished.  HENT:  Head: Normocephalic and atraumatic.  Eyes: Pupils are equal, round, and reactive to light.  Wears full set dentures  Neck: Normal range of motion.  Cardiovascular: Normal rate and regular rhythm.  Pulmonary/Chest: Effort normal and breath sounds normal.  Abdominal: Soft. Bowel sounds are normal.  Musculoskeletal: She exhibits edema (1+ pedal/tibial edema bilaterally. ).  Neurological: She is alert and oriented to person, place, and time.  Low voice with mild dysarthria/dysphonia but no obvious CN abnormalities. Cognitively displays good insight and awareness.  RUE is 4/5. RLE is 3- HF, KE and 3-4/5 distally. Sensation 1+ RLE, near normal RUE. DTR's 1+. Cognitively appropriate.  Skin: Skin is warm and dry.  Bilateral heels with abraded areas. R-4th toe with small blister. Callus under right great toe and plantar surface.   Results for orders placed during the hospital encounter of 02/03/12 (from the past 48 hour(s))     02/07/12 4:57 PM   Component  Value  Range  Comment    Glucose-Capillary  144 (*)  70 - 99 mg/dL     Comment 1  Notify RN      Comment 2  Documented in Chart     GLUCOSE, CAPILLARY Status: Abnormal    Collection Time    02/07/12 9:43 PM   Component  Value  Range  Comment    Glucose-Capillary  265 (*)  70 - 99 mg/dL    GLUCOSE, CAPILLARY Status: Abnormal    Collection Time    02/08/12 3:21 AM   Component  Value  Range  Comment    Glucose-Capillary  154 (*)  70 - 99 mg/dL    BASIC METABOLIC PANEL Status: Abnormal    Collection Time    02/08/12 4:50 AM   Component  Value  Range  Comment    Sodium  138  135 - 145 mEq/L     Potassium  3.9  3.5 - 5.1 mEq/L     Chloride  102  96 - 112 mEq/L     CO2  28  19 - 32 mEq/L     Glucose, Bld  145 (*)  70 - 99 mg/dL     BUN  18  6 - 23 mg/dL     Creatinine, Ser  1.61 (*)  0.50 - 1.10 mg/dL     Calcium  8.9  8.4 - 10.5 mg/dL     GFR calc non Af Amer  33 (*)  >90 mL/min     GFR calc Af Amer  38 (*)  >90 mL/min    GLUCOSE, CAPILLARY Status: Abnormal    Collection Time    02/08/12 7:37 AM   Component  Value  Range  Comment    Glucose-Capillary  137 (*)  70 - 99 mg/dL    Mr Cervical Spine W Wo Contrast  02/06/2012 *RADIOLOGY REPORT* Clinical Data: 79 year old female with right side weakness. The patient refused postcontrast imaging. MRI CERVICAL SPINE WITHOUT CONTRAST  IMPRESSION: 1. Degenerative multifactorial cervical spinal stenosis from C2-C3 to C6-C7, most pronounced at C3-C4 and C4-C5. There may be mild spinal cord mass effect, but there is no definite spinal cord signal abnormality. 2. Degenerative moderate or severe neural foraminal stenosis at the bilateral C3, C4, C5, right C6, and right C8 nerve levels. 3. Chronic T2 and T3 compression fractures. Original Report Authenticated By: Erskine Speed, M.D.  Post Admission Physician Evaluation:  1. Functional deficits secondary to acute on chronic right sided weakness/ gait disorder which appears  multifactorial likely related to diabetes and multiple medical issues/ cervical spondylosis/stenosis, wounds, etc.  MRI was negative for acute infarct . 2. Patient is admitted to receive collaborative, interdisciplinary care between the physiatrist, rehab nursing staff, and therapy team. 3. Patient's level of medical complexity and substantial therapy needs in context of that medical necessity cannot be provided at a lesser intensity of care such as a SNF. 4. Patient has experienced substantial functional loss from his/her baseline which was documented above under the "Functional History" and "Functional Status" headings. Judging by the patient's diagnosis, physical exam, and functional history, the patient has potential for functional progress which will result in measurable gains while on inpatient rehab. These gains will be of substantial and practical use upon discharge in facilitating mobility and self-care at the household level. 5. Physiatrist will provide 24 hour management of medical needs as well as oversight of the therapy plan/treatment and provide guidance as appropriate regarding the interaction of the two. 6. 24 hour rehab nursing will assist with bladder management, bowel management, safety, skin/wound care, disease management, medication administration, pain management and patient education and help integrate therapy concepts, techniques,education, etc. 7. PT will assess and treat for: Lower extremity strength, range of motion, stamina, balance, functional mobility, safety, adaptive techniques and equipment, NMR, , family education. Goals are: supervision to mod I . 8. OT will assess and treat for: ADL's,  functional mobility, safety, upper extremity strength, adaptive techniques and equipment, NMR, education. Goals are: supervision to mod I. 9. SLP will assess and treat for: swallowing, communication. Goals are: mod I. 10. Case Management and Social Worker will assess and treat for  psychological issues and discharge planning. 11. Team conference will be held weekly to assess progress toward goals and to determine barriers to discharge. 12. Patient will receive at least 3 hours of therapy per day at least 5 days per week. 13. ELOS: 7-10 days Prognosis: excellent Medical Problem List and Plan:  1. DVT Prophylaxis/Anticoagulation: Pharmaceutical: Lovenox  2. Diabetic peripheral neuropathy: Continue neurontin 300 mg bid. May need dose adjusted if renal functions declines off IVF.  3. Mood: Seems motivated and upbeat. Will monitor for now.  4. Neuropsych: This patient is capable of making decisions on his/her own behalf.  5. HTN: monitor with bid checks.  6. DM type 2 with poor control: Monitor with AC/HS CBG checks. Continue lantus with meal coverage.  7. Chronic diastolic CHF: Check daily weights. Renal status much improved and IVF discontinued today. Monitor for signs of overload after and as we Continue to hold lasix. Add salt restrictions.  8. Diabetic foot ulcers: Continue foam dressing. Will add PRAFOs.. Shoes when OOB and pressure relief measures. Wounds are likley affecting balance as well.  02/08/2012  Ranelle Oyster, MD, [place] [facility]

## 2012-02-08 NOTE — Progress Notes (Signed)
Patient admitted to inpatient rehab at 1710.  Patient alert and oriented, admission packet given and explained. Side rails up x3, call bell within reach.   Patient in recliner with visitor at the bedside.

## 2012-02-08 NOTE — PMR Pre-admission (Signed)
PMR Admission Coordinator Pre-Admission Assessment  Patient: [name] is an 79 y.o., female MRN: [id number] DOB: Jun 12, 1919 Height: 5\' 8"  (172.7 cm) Weight: 79.379 kg (175 lb)              Insurance Information HMO:    PPO:       PCP:       IPA:       80/20:    OTHER: yes  PRIMARY: Medicare      Policy#: [id number]      Subscriber: pt     Employer: retired Benefits:       Name: Armed forces technical officer. Date: 05/26/86     Deduct: $1216.00      Out of Pocket Max: 0      Life Max:  CIR: 100% after deduct      SNF: 100% days 1-20; $152.00/day days 21-100 Outpatient: 80%     Co-Pay: 20% Home Health: 100%      Co-Pay: 20% equipment used DME: 80%     Co-Pay: 20% Providers: pt's choice  SECONDARY: A[employer] plan F      Policy#: [id number]      Subscriber: pt    Emergency Contact Information Contact Information    Name Relation Home Work Yetter Daughter   [phone]     Current Medical History  Patient Admitting Diagnosis: ?small left subcortical/brainstem infarct  History of Present Illness:  79 y.o. female with history of DM, chronic diarrhea, gait disorder with multiple falls, who was admitted 02/03/12 with increasing weakness, speech difficulties with right sided weakness after Thanksgiving and tendency to drag right foot. MRI/MRA brain without acute changes. Neurology consulted and MRI C-spine with multifactorial cervical stenosis from C2/C3 to C6/C7 with moderate to severe foraminal stenosis  :  Total: 1     Past Medical History  Past Medical History  Diagnosis Date  . Myocardial infarction   . Coronary artery disease   . Hypertension   . Dysrhythmia   . Peripheral vascular disease   . Hypothyroidism   . Diabetes mellitus   . Presbyesophagus 04/23/2011  . Urinary retention with incomplete bladder emptying 04/23/2011  . UTI (lower urinary tract infection) 04/21/2011  . Diastolic CHF, chronic     Grade I diastolic dysfunction on Echo 04/26/11  . Anaphylaxis 04/25/2011     Thought to be from Rocephin    Family History  family history includes Cancer in her sister; Diabetes in her mother; and Heart disease in her father.  Prior Rehab/Hospitalizations: yes-recently at [facility] after a fall.     Current Medications  Current facility-administered medications:acetaminophen (TYLENOL) suppository 650 mg, 650 mg, Rectal, Q6H PRN, Lars Mage, MD;  acetaminophen (TYLENOL) tablet 650 mg, 650 mg, Oral, Q6H PRN, Lars Mage, MD;  aspirin suppository 300 mg, 300 mg, Rectal, Daily, Elease Etienne, MD, 300 mg at 02/04/12 1259;  aspirin tablet 325 mg, 325 mg, Oral, Daily, Elease Etienne, MD, 325 mg at 02/08/12 4010 calcium carbonate (OS-CAL - dosed in mg of elemental calcium) tablet 500 mg of elemental calcium, 1 tablet, Oral, Daily, Lars Mage, MD, 500 mg of elemental calcium at 02/08/12 0958;  carvedilol (COREG) tablet 6.25 mg, 6.25 mg, Oral, BID WC, Lars Mage, MD, 6.25 mg at 02/08/12 0851;  diltiazem (CARDIZEM CD) 24 hr capsule 180 mg, 180 mg, Oral, QHS, Ankit Garg, MD, 180 mg at 02/07/12 2235 enoxaparin (LOVENOX) injection 30 mg, 30 mg, Subcutaneous, Q24H, Elease Etienne, MD, 30 mg at 02/07/12 2235;  gabapentin (NEURONTIN) capsule 300 mg, 300 mg, Oral, BID, Lars Mage, MD, 300 mg at 02/08/12 0958;  hydrALAZINE (APRESOLINE) injection 5 mg, 5 mg, Intravenous, Q6H PRN, Rolan Lipa, NP, 5 mg at 02/07/12 0025 insulin aspart (novoLOG) injection 0-15 Units, 0-15 Units, Subcutaneous, TID WC, Ripudeep K Rai, MD, 2 Units at 02/08/12 0850;  insulin aspart (novoLOG) injection 0-5 Units, 0-5 Units, Subcutaneous, QHS, Ripudeep K Rai, MD, 3 Units at 02/07/12 2235;  insulin aspart (novoLOG) injection 4 Units, 4 Units, Subcutaneous, TID WC, Ripudeep K Rai, MD, 4 Units at 02/08/12 0850 insulin glargine (LANTUS) injection 20 Units, 20 Units, Subcutaneous, QHS, Elease Etienne, MD, 20 Units at 02/07/12 2235;  isosorbide mononitrate (IMDUR) 24 hr tablet 30 mg, 30 mg, Oral,  Daily, Lars Mage, MD, 30 mg at 02/08/12 1610;  levothyroxine (SYNTHROID, LEVOTHROID) tablet 200 mcg, 200 mcg, Oral, QAC breakfast, Gwen Her, PHARMD, 200 mcg at 02/08/12 9604 levothyroxine (SYNTHROID, LEVOTHROID) tablet 25 mcg, 25 mcg, Oral, QAC breakfast, Elease Etienne, MD, 25 mcg at 02/08/12 [phone];  multivitamin with minerals tablet 1 tablet, 1 tablet, Oral, Daily, Lars Mage, MD, 1 tablet at 02/08/12 8119;  neomycin-bacitracin-polymyxin (NEOSPORIN) ointment, , Topical, Daily, Elease Etienne, MD, 1 application at 02/08/12 0958;  ondansetron (ZOFRAN) injection 4 mg, 4 mg, Intravenous, Q6H PRN, Lars Mage, MD ondansetron (ZOFRAN) tablet 4 mg, 4 mg, Oral, Q6H PRN, Lars Mage, MD;  pantoprazole (PROTONIX) EC tablet 40 mg, 40 mg, Oral, Daily, Lars Mage, MD, 40 mg at 02/08/12 0958;  RESOURCE THICKENUP CLEAR, , Oral, PRN, Elease Etienne, MD;  senna-docusate (Senokot-S) tablet 1 tablet, 1 tablet, Oral, QHS PRN, Elease Etienne, MD;  Tamsulosin HCl (FLOMAX) capsule 0.4 mg, 0.4 mg, Oral, q morning - 10a, Lars Mage, MD, 0.4 mg at 02/08/12 1478 traZODone (DESYREL) tablet 50 mg, 50 mg, Oral, QHS, Lars Mage, MD, 50 mg at 02/07/12 2234  Patients Current Diet: Dysphagia D3 thin liquids  Precautions / Restrictions Precautions Precautions: Fall Restrictions Weight Bearing Restrictions: No   Prior Activity Level  Independent, but, has fallen at home Home Assistive Devices / Equipment Home Assistive Devices/Equipment: Eyeglasses;Walker (specify type);Wheelchair Home Adaptive Equipment: Walker - rolling  Prior Functional Level Prior Function Level of Independence: Independent with assistive device(s) Able to Take Stairs?: Yes Driving: No Vocation: Retired  Current Functional Level Cognition  Arousal/Alertness: Awake/alert Overall Cognitive Status: Appears within functional limits for tasks assessed (suspect at baseline) Overall Cognitive Status: Appears within functional limits for  tasks assessed/performed Difficult to assess due to: Level of arousal Orientation Level: Oriented X4 Cognition - Other Comments: Pt did start to become more and more alert as session progressed. Attention: Focused;Sustained Focused Attention: Appears intact Sustained Attention: Appears intact Memory: Impaired Memory Impairment: Decreased recall of new information;Decreased short term memory (suspect baseline) Decreased Short Term Memory: Verbal complex;Functional complex Awareness: Appears intact Problem Solving: Appears intact (for basic ADLs) Behaviors: Impulsive (with self feeding) Safety/Judgment: Appears intact    Extremity Assessment (includes Sensation/Coordination)  RUE ROM/Strength/Tone: Deficits RUE ROM/Strength/Tone Deficits: Demo's only about 90* shoulder flex/abd. Strength at shoulder 3+/5. Pt reports an old shoulder fx about 59yrs ago. WFL at elbow and distally. RUE Sensation: WFL - Light Touch;WFL - Proprioception RUE Coordination: WFL - gross motor;Deficits RUE Coordination Deficits: Pt frequently drops small objects such as toothbrush and toothpaste. Pt was also unable to successfully complete thumb to finger opposition.  RLE ROM/Strength/Tone: Deficits RLE ROM/Strength/Tone Deficits: pt had increasing deficits as she moves her leg, with increasing difficulty  in bringing leg through in gait.   RLE Sensation:  (difficulty to get full assessment of sensation from patient) RLE Coordination: Deficits RLE Coordination Deficits: increasing problems with coordination as pt uses leg    ADLs  Grooming: Brushing hair;Maximal assistance (2* lethargy) Where Assessed - Grooming: Unsupported sitting Upper Body Bathing: Minimal assistance Where Assessed - Upper Body Bathing: Unsupported sitting Lower Body Bathing: Maximal assistance Where Assessed - Lower Body Bathing: Supported sit to stand Upper Body Dressing: Minimal assistance Where Assessed - Upper Body Dressing: Unsupported  sitting Lower Body Dressing: Maximal assistance Where Assessed - Lower Body Dressing: Unsupported sitting (to don socks (pt too lethargic.)) Toilet Transfer: Minimal assistance Toilet Transfer Method: Sit to stand Toilet Transfer Equipment: Comfort height toilet;Grab bars Toileting - Clothing Manipulation and Hygiene: Minimal assistance (max cues for safety ) Where Assessed - Toileting Clothing Manipulation and Hygiene: Sit to stand from 3-in-1 or toilet Equipment Used: Rolling walker Transfers/Ambulation Related to ADLs: Pt ambulated to the bathroom with min A after several minutes of trying to arouse pt. Max cueing for safety manipulating RW in the bathroom and around obstacles. ADL Comments: Pt completed SPT from chair>bsc>chair. MAX cues for safety and hand placement. Pt has tendency to abandon RW before reaching destination. Pt required A to wipe back peri area following toileting and for thoroughness when cleaning dentures.    Mobility  Bed Mobility: Supine to Sit Rolling Right: 4: Min assist;With rail Rolling Left: 4: Min assist;With rail Supine to Sit: 5: Supervision;HOB elevated Supine to Sit: Patient Percentage: 60% Sit to Supine: 4: Min assist;With rail;HOB flat    Transfers  Transfers: Stand to Sit;Sit to Stand Sit to Stand: 4: Min assist;With upper extremity assist;From bed;From chair/3-in-1 Stand to Sit: 4: Min assist;With upper extremity assist;To chair/3-in-1    Ambulation / Gait / Stairs / Psychologist, prison and probation services  Ambulation/Gait Ambulation/Gait Assistance: 4: Min Government social research officer (Feet): 120 Feet Assistive device: Rolling walker Ambulation/Gait Assistance Details: pt ambulated 120 feet with verbal cues for increasing BOS and safe RW distance Gait Pattern: Step-through pattern;Decreased stride length;Trunk flexed;Narrow base of support Gait velocity: decreased General Gait Details: pt noted to have more diffculty with heel strike and swing though as she  fatigues Stairs: No Wheelchair Mobility Wheelchair Mobility: No    Posture / Games developer Sitting - Balance Support: No upper extremity supported;Feet supported Static Sitting - Level of Assistance: 5: Stand by assistance Static Standing Balance Static Standing - Balance Support: Bilateral upper extremity supported;During functional activity Static Standing - Level of Assistance: 5: Stand by assistance High Level Balance High Level Balance Activites: Head turns High Level Balance Comments: pt performed head turns with min assist to guide RW for 60 feet and then marching with RW for 60 feet    Special needs/care consideration BiPAP/CPAP no CPM no Continuous Drip IV no Dialysis no        Days no Life Vest no Oxygen no Special Bed no Trach Size no Wound Vac (area) no      Location no Skin  dry                             Location generally Bowel mgmt: [facility] 02/06/12 continent Bladder mgmt: urgency incontinence Francis Dowse is being treated for UTI] Diabetic mgmt yes     Previous Home Environment Living Arrangements: Alone Lives With: Alone Available Help at Discharge: Personal care attendant (per dtr, 9am-1pm  only 4  hrs) Type of Home: House Home Layout: One level Home Access: Stairs to enter Entrance Stairs-Rails: Can reach both Entrance Stairs-Number of Steps: 2 Home Care Services: Yes Type of Home Care Services: Other (Comment) (girl recommended from church)  Discharge Living Setting Plans for Discharge Living Setting: Patient's home (daughter aware of recommendation for 24/7 care at d/c) Do you have any problems obtaining your medications?: No  Social/Family/Support Systems Patient Roles: Parent Contact Information: pt's home ph# [phone] Anticipated Caregiver: daughter to arrange Anticipated Caregiver's Contact Information: daughter's cell ph# [phone] Ability/Limitations of Caregiver: daughter works & does not live w/ pt Caregiver  Availability: Intermittent (aware pt will need 24/7) Discharge Plan Discussed with Primary Caregiver: Yes Is Caregiver In Agreement with Plan?: Yes Does Caregiver/Family have Issues with Lodging/Transportation while [name] in Rehab?: No    Goals/Additional Needs Patient/Family Goal for Rehab: Mod I-S Expected length of stay: 10-14 days Pt/Family Agrees to Admission and willing to participate: Yes Program Orientation Provided & Reviewed with Pt/Caregiver Including Roles  & Responsibilities: Yes   Decrease burden of Care through IP rehab admission: Patient/family education   Possible need for SNF placement upon discharge: not expected   Patient Condition: This patient's condition remains as documented in the Consult dated 02/07/12, in which the Rehabilitation Physician determined and documented that the patient's condition is appropriate for intensive rehabilitative care in an inpatient rehabilitation facility.  Preadmission Screen Completed By:  Brock Ra, 02/08/2012 11:48 AM ______________________________________________________________________   Discussed status with Dr. Riley Kill on 02/08/12 at 11:51am and received telephone approval for admission today.  Admission Coordinator:  Brock Ra, time 11:52am/Date 02/08/12

## 2012-02-08 NOTE — Discharge Summary (Signed)
Physician Discharge Summary  [name] FAO:[id number] DOB: 23-Feb-1919 DOA: 02/03/2012  PCP: [name], MD  Admit date: 02/03/2012 Discharge date: 02/08/2012  Time spent: Greater than 30 minutes  Recommendations for Outpatient Follow-up:  1. With Dr. [name], PCP after discharge from inpatient rehabilitation. 2. Recommend outpatient workup for myasthenia (Acetylcholine binding, blocking and modulating antibodies. Anti-smooth muscle and striated muscle antibodies) 3. Recommend speech therapy followup at rehabilitation.  Discharge Diagnoses:  Active Problems:  DIABETES MELLITUS, TYPE II, UNCONTROLLED  DIABETIC PERIPHERAL NEUROPATHY  HYPERTENSION, BENIGN ESSENTIAL  UTI (lower urinary tract infection)  Dehydration  Hypothyroidism  Hyponatremia  Renal failure (ARF), acute on chronic  Hyperkalemia  Dysarthria   Discharge Condition: Improved and stable  Diet recommendation: Dysphagia 3 diet with thin liquids.  Filed Weights   02/03/12 2338  Weight: 79.379 kg (175 lb)    History of present illness:  79 year old female with history of DM 2, hypothyroidism, chronic diastolic CHF, CKD with baseline creatinine 1.7, CAD, HTN was admitted on 02/03/12 with history of generalized weakness. Patient gives at least 2 week history of slurred speech, right-sided weakness, unable to right with right hand and dropping things from right hand. She had dysuria, urinary frequency and incontinence. She lives alone. Has chronic diarrhea. She was admitted for further evaluation and management  Hospital Course:   Dysarthria/right hemiparesis  Patient gives approximately 3 week history of slurred speech and right-sided weakness. Her history and physical exam were initially suspicious for CVA. Extensive CVA workup was done but no stroke was found on MRI. UTI was suspected and patient was empirically started on antibiotics but cultures were negative and antibiotics were stopped. She indicates that her  slurred speech at times waxes and wanes. It's worse after she has been talking for some time or towards the end of the day. Neurology was consulted. C-spine MRI was done which shows spinal stenosis but no cord signal abnormality. Neurology recommends increased dose of aspirin 325 mg daily. They did not see any features suggestive of Parkinson's disease. Unclear etiology as to her presenting symptoms-may consider workup for myasthenia as outpatient. This was discussed with patient's PCP. Heart dysarthria and right-sided weakness have improved. She will go to CIR for further rehabilitation.  Dysphagia ? Secondary to same problem as above. ST evaluated and recommended dysphagia 3 diet with thin liquids which patient is tolerating.  Acute on chronic kidney disease  Secondary to dehydration. Lasix was temporarily held and patient was briefly hydrated with IV fluids. Creatinine has improved and is probably at baseline. Resume home dose of Lasix the   Hyperkalemia  Likely secondary to acute renal failure. Resolved   Hypertension  Reasonably controlled. Continue Cardizem and carvedilol. Monitor   Type 2 diabetes mellitus Patient's Lantus was reduced to 20 units each bedtime and started on NovoLog meal insulin. Inpatient control is reasonable. Hemoglobin A1c 10.3 indicating poor outpatient control. Her medications will need to be titrated as outpatient.  Hypothyroidism  Continue Synthroid.   Feet ulcers  Wound care consulted. Likely secondary to diabetic neuropathy. Continue management per wound care team.  Chronic diastolic CHF/CAD  Compensated. Continue Imdur, ASA, Coreg. Lasix was held initially on admission-will be resumed at discharge.  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 No new CVA on MRI. Continue aspirin.  Cervical spinal stenosis C3-C5 but no cord signal abnormality Majority of patients stenosis is on the left and patient's complaints  of right-sided.  Chronic T2 and T3 compression  fractures Asymptomatic of back pain  Procedures:  None  Consultations:  Neurology  Rehabilitation M.D.  Discharge Exam:  Complaints Patient indicates that her slurred speech and right-sided weakness or much better than on admission. Denies any other complaints.  Filed Vitals:   02/07/12 2120 02/08/12 0214 02/08/12 0527 02/08/12 1403  BP: 148/48 150/50 151/49 161/52  Pulse: 68 67 65 69  Temp: 98.8 F (37.1 C) 98.7 F (37.1 C) 98.8 F (37.1 C) 98.7 F (37.1 C)  TempSrc: Oral Oral Oral Oral  Resp: 18 18 18 20   Height:      Weight:      SpO2: 94% 98% 98% 100%    General: Alert and awake, oriented x3, not in any acute distress, dysarthria-seems better.  CVS: S1-S2 clear, no murmur rubs or gallops. Telemetry shows sinus rhythm.  Chest: clear to auscultation bilaterally, no wheezing, rales or rhonchi  Abdomen: soft nontender, nondistended, normal bowel sounds, no organomegaly  Extremities: no cyanosis, clubbing or edema noted bilaterally.? 4+/5 power right upper extremity. No cogwheel rigidity or shuffling gait  Neuro: no other cranial nerve deficits   Discharge Instructions      Discharge Orders    Future Orders Please Complete By Expires   Increase activity slowly      Discharge instructions      Comments:   DIET: Dysphagia 3 diet with thin liquids.   Discharge wound care:      Comments:   Please follow recommendations as per wound care team.       Medication List     As of 02/08/2012  3:20 PM    STOP taking these medications         aspirin 81 MG chewable tablet      insulin lispro 100 UNIT/ML injection   Commonly known as: HUMALOG      TAKE these medications         aspirin EC 325 MG tablet   Take 1 tablet (325 mg total) by mouth daily.      calcium carbonate 1250 MG tablet   Commonly known as: OS-CAL - dosed in mg of elemental calcium   Take 1 tablet by mouth daily.      carvedilol 6.25 MG  tablet   Commonly known as: COREG   Take 6.25 mg by mouth 2 (two) times daily with a meal.      diltiazem 180 MG 24 hr capsule   Commonly known as: CARDIZEM CD   Take 180 mg by mouth at bedtime.      furosemide 20 MG tablet   Commonly known as: LASIX   Take 20 mg by mouth 2 (two) times daily.      gabapentin 300 MG capsule   Commonly known as: NEURONTIN   Take 300 mg by mouth 2 (two) times daily.      insulin aspart 100 UNIT/ML injection   Commonly known as: novoLOG   Inject 4 Units into the skin 3 (three) times daily with meals.      insulin glargine 100 UNIT/ML injection   Commonly known as: LANTUS   Inject 20 Units into the skin at bedtime.      isosorbide mononitrate 30 MG 24 hr tablet   Commonly known as: IMDUR   Take 30 mg by mouth daily.      levothyroxine 200 MCG tablet   Commonly known as: SYNTHROID, LEVOTHROID   Take 200 mcg by mouth every morning. Takes along with 25mg  tablet together  levothyroxine 25 MCG tablet   Commonly known as: SYNTHROID, LEVOTHROID   Take 25 mcg by mouth every morning. Takes along with 200mg  tablet together      multivitamin with minerals Tabs   Take 1 tablet by mouth daily.      neomycin-bacitracin-polymyxin ointment   Commonly known as: NEOSPORIN   Apply topically daily. Apply to right foot callous on plantar surface and to upper toe callous Q day.  Leave open to air or cover with band aid if desired.      pantoprazole 40 MG tablet   Commonly known as: PROTONIX   Take 1 tablet (40 mg total) by mouth daily.      Tamsulosin HCl 0.4 MG Caps   Commonly known as: FLOMAX   Take 0.4 mg by mouth every morning.      traZODone 50 MG tablet   Commonly known as: DESYREL   Take 1 tablet (50 mg total) by mouth at bedtime.           The results of significant diagnostics from this hospitalization (including imaging, microbiology, ancillary and laboratory) are listed below for reference.    Significant Diagnostic Studies: Dg  Chest 2 View  02/04/2012  *RADIOLOGY REPORT*  Clinical Data: Stroke  CHEST - 2 VIEW  Comparison: 12/26/2011  Findings: Mild vascular congestion is present but this has improved.  The heart is mildly enlarged.  Bibasilar atelectasis. No new consolidation or mass.  No Kerley B lines to suggest interstitial edema. Sclerotic changes in the proximal right humerus are stable.  IMPRESSION: Improving vascular congestion.   Original Report Authenticated By: Jolaine Click, M.D.    Ct Head Wo Contrast  02/03/2012  *RADIOLOGY REPORT*  Clinical Data: Slurred speech.  Unsteady gait.  Right-sided weakness.  CT HEAD WITHOUT CONTRAST  Technique:  Contiguous axial images were obtained from the base of the skull through the vertex without contrast.  Comparison: 02/21/2008  Findings: There is no evidence for acute infarction, intracranial hemorrhage, mass lesion, hydrocephalus, or extra-axial fluid. Moderate to severe atrophy is present.  Advanced chronic microvascular ischemic change is present in the periventricular and subcortical white matter.  Chronic hypodensity left insular cortex.  No CT signs of proximal vascular thrombosis in the left MCA territory.  No evidence for chronic subdural hematoma.  Calvarium intact.  Advanced carotid and vertebral atherosclerosis.  Clear sinuses and mastoids.  IMPRESSION: Stable exam.  Atrophy and chronic microvascular ischemic change without visible acute stroke or hemorrhage.   Original Report Authenticated By: Davonna Belling, M.D.    Mr [facility] Wo Contrast  02/04/2012  *RADIOLOGY REPORT*  Clinical Data:  Diabetic hypertensive with chronic renal disease presenting with right-sided weakness and difficulty speaking.  MRI BRAIN WITHOUT CONTRAST MRA HEAD WITHOUT CONTRAST  Technique: Multiplanar, multiecho pulse sequences of the brain and surrounding structures were obtained according to standard protocol without intravenous contrast.  Angiographic images of the head were obtained using MRA  technique without contrast.  Comparison: 02/03/2012 head CT.  No comparison brain MR.  MRI HEAD  Findings:  No acute infarct.  No intracranial hemorrhage.  Small vessel disease type changes.  Global atrophy.  Ventricular prominence felt to be related to atrophy rather hydrocephalus.  No intracranial mass lesion detected on this unenhanced exam.  Partial opacification inferior aspect left mastoid air cells.  No obstructing lesion posterior-superior nasopharynx.  Paranasal sinus mild mucosal thickening.  Cervical spondylotic changes with spinal stenosis most prominent C2- 3.  Transverse ligament hypertrophy.  Major intracranial  vascular structures are patent.  Cervical medullary junction, pituitary region, pineal region and orbital structures unremarkable.  IMPRESSION: No acute infarct.  Please see above.  MRA HEAD  Findings: Mild irregularity of the cavernous segment of the internal carotid artery bilaterally.  Anterior circulation without medium or large size vessel significant stenosis or occlusion.  Middle cerebral artery and A2 segment anterior cerebral artery mild irregularity bilaterally.  Left vertebral artery is dominant size.  Mild narrowing portions of the right vertebral artery.  Nonvisualization right PICA.  Mild irregularity of the basilar artery without high-grade stenosis.  Small left AICA.  Moderate narrowing proximal right and distal left superior cerebellar artery.  High-grade focal stenosis of the right posterior cerebral artery P1- 2 junction.  Marked narrowing distal right posterior cerebral artery superior branch.  No aneurysm noted.  IMPRESSION: Intracranial atherosclerotic type changes most notable involving the right posterior cerebral artery as detailed above.   Original Report Authenticated By: Lacy Duverney, M.D.     Mr Cervical Spine W Wo Contrast  02/06/2012  *RADIOLOGY REPORT*  Clinical Data: 79 year old female with right side weakness.  The patient refused postcontrast imaging.  MRI  CERVICAL SPINE WITHOUT CONTRAST  Technique:  Multiplanar and multiecho pulse sequences of the cervical spine, to include the craniocervical junction and cervicothoracic junction, were obtained according to standard protocol without intravenous contrast.  Contrast:  None.  Comparison:  Brain MRI 02/04/2012.  Cervical radiographs 05/20/2009.  Findings:  Cervicomedullary junction is within normal limits.  Cervical vertebral height and alignment appears overall stable since 2011, including trace anterolisthesis of C4 on C5.  Chronic disc space loss and probable interbody ankylosis at C5-C6.  Chronic T3 mild to moderate compression fracture.  Less pronounced chronic T2 compression fracture but with more anterior wedging. No marrow edema or evidence of acute osseous abnormality.    Visualized paraspinal soft tissues are within normal limits.  Small left mastoid effusion re-identified.  Detail of the spinal cord is intermittently degraded by motion artifact.  No definite spinal cord signal abnormality in the cervical spine or visualized upper thoracic spine.  C2-C3:  Borderline spinal stenosis without definite spinal cord mass effect related to small disc protrusion and severe ligament flavum hypertrophy. Moderate bilateral C3 foraminal stenosis.  C3-C4:  Mild spinal stenosis with perhaps mild spinal cord mass effect related to circumferential disc osteophyte complex and moderate ligament flavum hypertrophy.  Severe left and moderate right C4 foraminal stenosis.  C4-C5:  Mild spinal stenosis related to circumferential disc osteophyte complex and moderate ligament flavum hypertrophy.  No definite spinal cord mass effect.  Moderate to severe facet hypertrophy on the left.  Severe bilateral C5 foraminal stenosis.  C5-C6:  Suspected interbody ankylosis.  Right greater than left uncovertebral and facet hypertrophy.  No spinal stenosis.  Severe right and mild left C6 foraminal stenosis.  C6-C7:  Circumferential disc osteophyte  complex and moderate to severe ligament flavum hypertrophy results in borderline spinal stenosis without mass effect on the spinal cord.  No significant foraminal stenosis.  C7-T1:  Negative disc.  Moderate ligament flavum hypertrophy.  Mild to moderate facet hypertrophy.  Moderate right C8 foraminal stenosis.  Mild chronic retropulsion of bone at the T3 compression fracture site, but no significant upper thoracic spinal or foraminal stenosis is identified.  IMPRESSION: 1.  Degenerative multifactorial cervical spinal stenosis from C2-C3 to C6-C7, most pronounced at C3-C4 and C4-C5.  There may be mild spinal cord mass effect, but there is no definite spinal cord signal abnormality. 2.  Degenerative moderate or severe neural foraminal stenosis at the bilateral C3, C4, C5, right C6, and right C8 nerve levels. 3.  Chronic T2 and T3 compression fractures.   Original Report Authenticated By: Erskine Speed, M.D.    Dg Swallowing Func-speech Pathology  02/05/2012  Vivi Ferns McCoy, CCC-SLP     02/05/2012  9:49 AM Objective Swallowing Evaluation: Modified Barium Swallowing Study   Patient Details  Name: SHERI GATCHEL MRN: [id number] Date of Birth: 22-May-1919  Today's Date: 02/05/2012 Time: 0900-0930 SLP Time Calculation (min): 30 min  Past Medical History:  Past Medical History  Diagnosis Date  . Myocardial infarction   . Coronary artery disease   . Hypertension   . Dysrhythmia   . Peripheral vascular disease   . Hypothyroidism   . Diabetes mellitus   . Presbyesophagus 04/23/2011  . Urinary retention with incomplete bladder emptying 04/23/2011  . UTI (lower urinary tract infection) 04/21/2011  . Diastolic CHF, chronic     Grade I diastolic dysfunction on Echo 04/26/11  . Anaphylaxis 04/25/2011    Thought to be from Rocephin   Past Surgical History:  Past Surgical History  Procedure Date  . Cholecystectomy   . Colonoscopy 01/15/2011    Procedure: COLONOSCOPY;  Surgeon: Theda Belfast, MD;   Location: [facility];  Service:  Endoscopy;  Laterality: N/A;   HPI:  79 yo female adm to [facility] with AMS, slurred speech, right sided  weakness.  Pt MRI 02/04/12 was negative for acute changes.  PMH +  for HTN, chronic renal disease, UTIs, DM, and pt had a fall in  November 2013.  Pt does have h/o dysphagia and has undergone MBS  in July 2011 and esophagram March 2013.   Pt admits to  "regurgitation" with  intake at times but daughter states this  has been ongoing for years .  Daughter also reports pt "coughs  all the time" and eats at rapid rate.  Daughter also states pt  will pocket food in the right side of her mouth and continue to  eat even after coughing episode. Pt and daughter deny pt with  weight loss or pulmonary infections.      Assessment / Plan / Recommendation Clinical Impression  Dysphagia Diagnosis: Mild oral phase dysphagia;Moderate oral  phase dysphagia (combined esophageal dysphagia) Clinical impression: Patient presents with a mild-moderate  oropharyngeal dysphagia, likely exacerbated baseline dysphagia  given previous MBS report. Patient impulsive with po intake  during self-feeding, consuming large consecutive sips of liquids,  which when combined with mild weakness and intermittently delayed  swallow reflex, results in penetration and aspiration of thin  liquids. Aspiration however occurred in only one episode when  combined with pill, and penetrates were trace, clearing mostly  during the swallowing and consistently post swallow with  spontaneous dry swallow. Additionally, mild tongue base weakness  and decreased hyo-laryngeal elevation/excursion results in mild  pharyngeal residuals which additionally clear with dry swallow.  Patient does endorse regurgitation with po intake which SLP  suspects is related to h/o esophageal deficits, particularly in  light of today's esophageal sweep revealing what appeared to be  solid food stasis in mid esophagus post swallow. Therapeutic  intervention,  in addition to providing verbal and  tactile cueing  for small single sips and intermittent dry swallow, included use  of liquid wash which appeared effective at facilitating  esophageal clearance. Recommend a dysphagia 3 (mechanical soft)  diet with thin liquids at this time, utilizing the above  precautions  to decrease risk of aspiration which remains  moderate.     Treatment Recommendation  Therapy as outlined in treatment plan below    Diet Recommendation Dysphagia 3 (Mechanical Soft);Thin liquid   Liquid Administration via: Cup;Straw Medication Administration: Whole meds with puree Supervision: Patient able to self feed;Full supervision/cueing  for compensatory strategies Compensations: Slow rate;Small sips/bites;Check for  pocketing;Multiple dry swallows after each bite/sip;Follow solids  with liquid Postural Changes and/or Swallow Maneuvers: Seated upright 90  degrees;Upright 30-60 min after meal    Other  Recommendations Oral Care Recommendations: Oral care BID   Follow Up Recommendations  Home health SLP;Skilled Nursing facility    Frequency and Duration min 2x/week  2 weeks       SLP Swallow Goals Patient will utilize recommended strategies during swallow to  increase swallowing safety with: Total assistance   General Date of Onset: 02/04/12 HPI: 79 yo female adm to [facility] with AMS, slurred speech, right  sided weakness.  Pt MRI 02/04/12 was negative for acute changes.   PMH + for HTN, chronic renal disease, UTIs, DM, and pt had a fall  in November 2013.  Pt does have h/o dysphagia and has undergone  MBS in July 2011 and esophagram March 2013.   Pt admits to  "regurgitation" with  intake at times but daughter states this  has been ongoing for years .  Daughter also reports pt "coughs  all the time" and eats at rapid rate.  Daughter also states pt  will pocket food in the right side of her mouth and continue to  eat even after coughing episode. Pt and daughter deny pt with  weight loss or pulmonary infections.  Type of Study: Modified Barium  Swallowing Study Reason for Referral: Objectively evaluate swallowing function Previous Swallow Assessment: Bedside swallow evaluation complete  1/10 indicated s/s of aspiration and need for MBS to evaluate  swallow objectively. Previous MBS complete 08/19/09 recommended  regular diet, thin liquids and esophageal w/u.  Esophagram 03/2011  mild presybesophagus, tertiary contractions, no hiatal hernia   Diet Prior to this Study: Dysphagia 3 (soft);Nectar-thick liquids Temperature Spikes Noted: No Respiratory Status: Room air History of Recent Intubation: No Behavior/Cognition: Requires cueing;Alert;Cooperative;Pleasant  mood Oral Cavity - Dentition: Dentures, bottom;Dentures, top Oral Motor / Sensory Function: Impaired - see Bedside swallow  eval Self-Feeding Abilities: Able to feed self;Needs assist Patient Positioning: Upright in chair Baseline Vocal Quality: Clear Volitional Cough: Strong Volitional Swallow: Able to elicit Anatomy: Other (Comment) (? prominent CP and possible CP bar) Pharyngeal Secretions: Not observed secondary MBS    Reason for Referral Objectively evaluate swallowing function   Oral Phase Oral Preparation/Oral Phase Oral Phase: Impaired Oral - Nectar Oral - Nectar Cup: Lingual/palatal residue Oral - Thin Oral - Thin Cup: Lingual/palatal residue Oral - Thin Straw: Lingual/palatal residue Oral - Solids Oral - Puree: Lingual/palatal residue Oral - Mechanical Soft: Lingual/palatal residue Oral - Pill: Delayed oral transit (provided with thin liquids)   Pharyngeal Phase Pharyngeal Phase Pharyngeal Phase: Impaired Pharyngeal - Nectar Pharyngeal - Nectar Cup: Reduced tongue base retraction;Reduced  laryngeal elevation;Reduced anterior laryngeal  mobility;Pharyngeal residue - valleculae;Pharyngeal residue -  pyriform sinuses Pharyngeal - Thin Pharyngeal - Thin Cup: Reduced tongue base retraction;Reduced  laryngeal elevation;Reduced anterior laryngeal  mobility;Pharyngeal residue - valleculae;Pharyngeal  residue -  pyriform sinuses;Penetration/Aspiration during swallow;Reduced  airway/laryngeal closure Penetration/Aspiration details (thin cup): Material enters  airway, remains ABOVE vocal cords and not ejected out (ejected  however with spontaneous dry swallow) Pharyngeal - Thin Straw:  Reduced tongue base retraction;Reduced  laryngeal elevation;Reduced anterior laryngeal  mobility;Pharyngeal residue - valleculae;Pharyngeal residue -  pyriform sinuses;Penetration/Aspiration after swallow (aspiration  occurring when combined with pill only) Penetration/Aspiration details (thin straw): Material enters  airway, passes BELOW cords and not ejected out despite cough  attempt by patient Pharyngeal - Solids Pharyngeal - Puree: Reduced tongue base retraction;Reduced  laryngeal elevation;Reduced anterior laryngeal  mobility;Pharyngeal residue - valleculae Pharyngeal - Mechanical Soft: Reduced tongue base  retraction;Reduced laryngeal elevation;Reduced anterior laryngeal  mobility;Pharyngeal residue - valleculae Pharyngeal - Pill: Within functional limits (although thin  liquids aspirated )  Cervical Esophageal Phase    GO    Cervical Esophageal Phase Cervical Esophageal Phase: Impaired Cervical Esophageal Phase - Nectar Nectar Cup: Prominent cricopharyngeal segment Cervical Esophageal Phase - Thin Thin Cup: Prominent cricopharyngeal segment Thin Straw: Prominent cricopharyngeal segment Cervical Esophageal Phase - Solids Puree: Prominent cricopharyngeal segment Mechanical Soft: Prominent cricopharyngeal segment Pill: Prominent cricopharyngeal segment Cervical Esophageal Phase - Comment Cervical Esophageal Comment: ? of CP bar also noted however  difficult to view and did not impede clearance of bolus through  upper esophagus. Esophageal sweep did reveal what appeared to be  solid food stais post swallow, clearing with clinician  facilitated liquid wash.         Ferdinand Lango MA, CCC-SLP [phone]  McCoy Leah Meryl 02/05/2012,  9:45 AM      Microbiology: Recent Results (from the past 240 hour(s))  URINE CULTURE     Status: Normal   Collection Time   02/03/12  6:18 PM      Component Value Range Status Comment   Specimen Description URINE, CATHETERIZED   Final    Special Requests NONE   Final    Culture  Setup Time 02/04/2012 02:42   Final    Colony Count NO GROWTH   Final    Culture NO GROWTH   Final    Report Status 02/05/2012 FINAL   Final   CLOSTRIDIUM DIFFICILE BY PCR     Status: Normal   Collection Time   02/05/12  4:05 AM      Component Value Range Status Comment   C difficile by pcr NEGATIVE  NEGATIVE Final      Labs: Basic Metabolic Panel:  Lab 02/08/12 8295 02/07/12 0450 02/05/12 0500 02/04/12 0540 02/03/12 1838  NA 138 136 136 135 129*  K 3.9 4.2 4.5 4.6 5.5*  CL 102 101 103 100 93*  CO2 28 27 25 26 26   GLUCOSE 145* 159* 289* 230* 105*  BUN 18 18 29* 38* 44*  CREATININE 1.38* 1.27* 1.69* 1.92* 2.16*  CALCIUM 8.9 9.0 8.7 8.5 9.3  MG -- -- -- -- --  PHOS -- -- -- -- --   Liver Function Tests: No results found for this basename: AST:5,ALT:5,ALKPHOS:5,BILITOT:5,PROT:5,ALBUMIN:5 in the last 168 hours No results found for this basename: LIPASE:5,AMYLASE:5 in the last 168 hours No results found for this basename: AMMONIA:5 in the last 168 hours CBC:  Lab 02/07/12 0450 02/05/12 0500 02/03/12 1840  WBC 9.1 7.9 13.8*  NEUTROABS -- -- 10.8*  HGB 11.2* 10.7* 12.0  HCT 34.0* 34.0* 36.6  MCV 87.9 89.7 88.2  PLT 263 238 285   Cardiac Enzymes:  Lab 02/03/12 1840  CKTOTAL --  CKMB --  CKMBINDEX --  TROPONINI <0.30   BNP: BNP (last 3 results)  Basename 04/27/11 0450 04/24/11 1106 04/23/11 0505  PROBNP 2796.0* 8616.0* 4651.0*   CBG:  Lab 02/08/12 1135 02/08/12 0737 02/08/12 0321 02/07/12 2143  02/07/12 1657  GLUCAP 194* 137* 154* 265* 144*    Additional lab data  Lipid panel: Cholesterol 164, triglycerides 150, HDL 55, LDL 79 and VLDL 30  Hemoglobin A1c 10.3  Stool C. difficile  PCR: Negative  2-D echo 02/04/12: Study Conclusions  - Left ventricle: The cavity size was normal. Wall thickness was normal. Systolic function was normal. The estimated ejection fraction was in the range of 60% to 65%. Features are consistent with a pseudonormal left ventricular filling pattern, with concomitant abnormal relaxation and increased filling pressure (grade 2 diastolic dysfunction). - Pulmonary arteries: PA peak pressure: 36mm Hg (S).  Carotid Dopplers: 02/04/12 Summary: No significant extracranial carotid artery stenosis demonstrated. Vertebrals are patent with antegrade flow.     Signed:  Azure Barrales  [facility] 02/08/2012, 3:20 PM

## 2012-02-08 NOTE — Progress Notes (Addendum)
NEURO HOSPITALIST PROGRESS NOTE   SUBJECTIVE:                                                                                                                        Patient feels both her speech and her strength have improved.   OBJECTIVE:                                                                                                                           Vital signs in last 24 hours: Temp:  [98.6 F (37 C)-98.8 F (37.1 C)] 98.8 F (37.1 C) (01/14 0527) Pulse Rate:  [64-68] 65  (01/14 0527) Resp:  [18] 18  (01/14 0527) BP: (120-151)/(39-50) 151/49 mmHg (01/14 0527) SpO2:  [94 %-100 %] 98 % (01/14 0527)  Intake/Output from previous day: 01/13 0701 - 01/14 0700 In: 960 [P.O.:960] Out: 803 [Urine:800; Stool:3] Intake/Output this shift:   Nutritional status: Dysphagia  Past Medical History  Diagnosis Date  . Myocardial infarction   . Coronary artery disease   . Hypertension   . Dysrhythmia   . Peripheral vascular disease   . Hypothyroidism   . Diabetes mellitus   . Presbyesophagus 04/23/2011  . Urinary retention with incomplete bladder emptying 04/23/2011  . UTI (lower urinary tract infection) 04/21/2011  . Diastolic CHF, chronic     Grade I diastolic dysfunction on Echo 04/26/11  . Anaphylaxis 04/25/2011    Thought to be from Rocephin     Neurologic Exam:   Mental Status: Alert, oriented, thought content appropriate.  Speech fluent without evidence of aphasia--If she talks to fast she will stumble over her words. .  Able to follow 3 step commands without difficulty. Cranial Nerves: II: Visual fields grossly normal, pupils equal, round, reactive to light and accommodation III,IV, VI: ptosis not present, extra-ocular motions intact bilaterally V,VII: smile symmetric, facial light touch sensation normal bilaterally VIII: hearing normal bilaterally IX,X: gag reflex present XI: bilateral shoulder shrug XII: midline tongue  extension Motor: Right : Upper extremity   4+/5    Left:     Upper extremity   5/5  Lower extremity   4+/5     Lower extremity   5/5 Tone and bulk:normal tone throughout; no atrophy noted  Sensory: Pinprick and light touch intact throughout, bilaterally Deep Tendon Reflexes: 1+ and symmetric throughout Plantars: Mute bilaterally Cerebellar: normal finger-to-nose,  normal heel-to-shin test  CV: pulses palpable throughout    Lab Results: Lab Results  Component Value Date/Time   CHOL 164 02/04/2012  5:40 AM   Lipid Panel No results found for this basename: CHOL,TRIG,HDL,CHOLHDL,VLDL,LDLCALC in the last 72 hours  Studies/Results: Mr Cervical Spine W Wo Contrast  02/06/2012  *RADIOLOGY REPORT*  Clinical Data: 79 year old female with right side weakness.  The patient refused postcontrast imaging.  MRI CERVICAL SPINE WITHOUT CONTRAST  Technique:  Multiplanar and multiecho pulse sequences of the cervical spine, to include the craniocervical junction and cervicothoracic junction, were obtained according to standard protocol without intravenous contrast.  Contrast:  None.  Comparison:  Brain MRI 02/04/2012.  Cervical radiographs 05/20/2009.  Findings:  Cervicomedullary junction is within normal limits.  Cervical vertebral height and alignment appears overall stable since 2011, including trace anterolisthesis of C4 on C5.  Chronic disc space loss and probable interbody ankylosis at C5-C6.  Chronic T3 mild to moderate compression fracture.  Less pronounced chronic T2 compression fracture but with more anterior wedging. No marrow edema or evidence of acute osseous abnormality.    Visualized paraspinal soft tissues are within normal limits.  Small left mastoid effusion re-identified.  Detail of the spinal cord is intermittently degraded by motion artifact.  No definite spinal cord signal abnormality in the cervical spine or visualized upper thoracic spine.  C2-C3:  Borderline spinal stenosis without definite  spinal cord mass effect related to small disc protrusion and severe ligament flavum hypertrophy. Moderate bilateral C3 foraminal stenosis.  C3-C4:  Mild spinal stenosis with perhaps mild spinal cord mass effect related to circumferential disc osteophyte complex and moderate ligament flavum hypertrophy.  Severe left and moderate right C4 foraminal stenosis.  C4-C5:  Mild spinal stenosis related to circumferential disc osteophyte complex and moderate ligament flavum hypertrophy.  No definite spinal cord mass effect.  Moderate to severe facet hypertrophy on the left.  Severe bilateral C5 foraminal stenosis.  C5-C6:  Suspected interbody ankylosis.  Right greater than left uncovertebral and facet hypertrophy.  No spinal stenosis.  Severe right and mild left C6 foraminal stenosis.  C6-C7:  Circumferential disc osteophyte complex and moderate to severe ligament flavum hypertrophy results in borderline spinal stenosis without mass effect on the spinal cord.  No significant foraminal stenosis.  C7-T1:  Negative disc.  Moderate ligament flavum hypertrophy.  Mild to moderate facet hypertrophy.  Moderate right C8 foraminal stenosis.  Mild chronic retropulsion of bone at the T3 compression fracture site, but no significant upper thoracic spinal or foraminal stenosis is identified.  IMPRESSION: 1.  Degenerative multifactorial cervical spinal stenosis from C2-C3 to C6-C7, most pronounced at C3-C4 and C4-C5.  There may be mild spinal cord mass effect, but there is no definite spinal cord signal abnormality. 2.  Degenerative moderate or severe neural foraminal stenosis at the bilateral C3, C4, C5, right C6, and right C8 nerve levels. 3.  Chronic T2 and T3 compression fractures.   Original Report Authenticated By: Erskine Speed, M.D.     MEDICATIONS  Scheduled:   . aspirin  300 mg Rectal Daily   Or  . aspirin   325 mg Oral Daily  . calcium carbonate  1 tablet Oral Daily  . carvedilol  6.25 mg Oral BID WC  . diltiazem  180 mg Oral QHS  . enoxaparin  30 mg Subcutaneous Q24H  . gabapentin  300 mg Oral BID  . insulin aspart  0-15 Units Subcutaneous TID WC  . insulin aspart  0-5 Units Subcutaneous QHS  . insulin aspart  4 Units Subcutaneous TID WC  . insulin glargine  20 Units Subcutaneous QHS  . isosorbide mononitrate  30 mg Oral Daily  . levothyroxine  200 mcg Oral QAC breakfast  . levothyroxine  25 mcg Oral QAC breakfast  . multivitamin with minerals  1 tablet Oral Daily  . neomycin-bacitracin-polymyxin   Topical Daily  . pantoprazole  40 mg Oral Daily  . Tamsulosin HCl  0.4 mg Oral q morning - 10a  . traZODone  50 mg Oral QHS    ASSESSMENT/PLAN:                                                                                                             Assessment/Plan:  Mild right sided weakness present approximately 2 months without progression. Generalized weakness for many years.  Mild dysarthria which appears to wax and wane. Dysarthria is worse when the patient is tired.  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 No new CVA on MRI Multiple medical problems as listed above; including diabetes, hypertension, and peripheral vascular disease.  Aspirin 81 mg daily prior to admission. The patient is now on aspirin 325 mg daily.  UTI  Chronic diarrhea. Cdiff study pending. Cervical spinal stenosis C3-C5 but no cord signal abnormality--majority of patients lateral stenosis is on the left and patients complaints are right sided.   At present time patients strength has improved significantly and speech has also improved.  MRI was negative for acute stroke which would R/O brain stem infarct. Although patients speech has improved she is still having some difficulty swallowing and currently being seen by SLP.  Agree with continuing SLP while in rehab. Patient shows no  bradykinesia, rigidity or features characteristics of parkinson and would not initiated any Sinemet at this time.   Recommend: 1) Continue PT/OT/SLP 2) Continue ASA 325 daily 3) Continue to treat UTI and chronic C-diff.    Neurology will S/O   Felicie Morn PA-C [facility] [phone]  02/08/2012, 8:37 AM

## 2012-02-09 ENCOUNTER — Inpatient Hospital Stay (HOSPITAL_COMMUNITY): Payer: Medicare Other

## 2012-02-09 ENCOUNTER — Inpatient Hospital Stay (HOSPITAL_COMMUNITY): Payer: Medicare Other | Admitting: Speech Pathology

## 2012-02-09 ENCOUNTER — Inpatient Hospital Stay (HOSPITAL_COMMUNITY): Payer: Medicare Other | Admitting: Occupational Therapy

## 2012-02-09 ENCOUNTER — Inpatient Hospital Stay (HOSPITAL_COMMUNITY): Payer: Medicare Other | Admitting: Physical Therapy

## 2012-02-09 DIAGNOSIS — R269 Unspecified abnormalities of gait and mobility: Secondary | ICD-10-CM

## 2012-02-09 DIAGNOSIS — R5381 Other malaise: Secondary | ICD-10-CM

## 2012-02-09 DIAGNOSIS — M47812 Spondylosis without myelopathy or radiculopathy, cervical region: Secondary | ICD-10-CM

## 2012-02-09 LAB — GLUCOSE, CAPILLARY
Glucose-Capillary: 140 mg/dL — ABNORMAL HIGH (ref 70–99)
Glucose-Capillary: 295 mg/dL — ABNORMAL HIGH (ref 70–99)
Glucose-Capillary: 130 mg/dL — ABNORMAL HIGH (ref 70–99)
Glucose-Capillary: 182 mg/dL — ABNORMAL HIGH (ref 70–99)

## 2012-02-09 LAB — CBC WITH DIFFERENTIAL/PLATELET
Basophils Absolute: 0.1 10*3/uL (ref 0.0–0.1)
Eosinophils Absolute: 0.5 10*3/uL (ref 0.0–0.7)
Eosinophils Relative: 5 % (ref 0–5)
Lymphocytes Relative: 30 % (ref 12–46)
Lymphs Abs: 2.5 10*3/uL (ref 0.7–4.0)
MCH: 28.7 pg (ref 26.0–34.0)
Neutrophils Relative %: 54 % (ref 43–77)
Platelets: 247 10*3/uL (ref 150–400)
RBC: 4.07 MIL/uL (ref 3.87–5.11)
RDW: 13.3 % (ref 11.5–15.5)
WBC: 8.4 10*3/uL (ref 4.0–10.5)

## 2012-02-09 LAB — COMPREHENSIVE METABOLIC PANEL
ALT: 9 U/L (ref 0–35)
AST: 10 U/L (ref 0–37)
Alkaline Phosphatase: 63 U/L (ref 39–117)
Calcium: 9.1 mg/dL (ref 8.4–10.5)
GFR calc Af Amer: 40 mL/min — ABNORMAL LOW (ref >90)
Glucose, Bld: 159 mg/dL — ABNORMAL HIGH (ref 70–99)
Potassium: 3.9 mEq/L (ref 3.5–5.1)
Sodium: 137 mEq/L (ref 135–145)
Total Protein: 6.7 g/dL (ref 6.0–8.3)

## 2012-02-09 MED ORDER — TAMSULOSIN HCL 0.4 MG PO CAPS
0.4000 mg | ORAL_CAPSULE | Freq: Every morning | ORAL | Status: DC
  Administered 2012-02-09 – 2012-02-17 (×9): 0.4 mg via ORAL
  Filled 2012-02-09 (×10): qty 1

## 2012-02-09 MED ORDER — BENEPROTEIN PO POWD
1.0000 | Freq: Three times a day (TID) | ORAL | Status: DC
  Administered 2012-02-09 – 2012-02-17 (×24): 6 g via ORAL
  Filled 2012-02-09 (×3): qty 227

## 2012-02-09 MED ORDER — GABAPENTIN 300 MG PO CAPS
300.0000 mg | ORAL_CAPSULE | Freq: Every day | ORAL | Status: DC
  Administered 2012-02-10 – 2012-02-16 (×7): 300 mg via ORAL
  Filled 2012-02-09 (×8): qty 1

## 2012-02-09 MED ORDER — TRAZODONE HCL 50 MG PO TABS
50.0000 mg | ORAL_TABLET | Freq: Every evening | ORAL | Status: DC | PRN
Start: 2012-02-09 — End: 2012-02-09

## 2012-02-09 NOTE — Progress Notes (Addendum)
Patient ID: [name], female   DOB: 1919/04/15, 79 y.o.   MRN: [id number]  Subjective/Complaints: 79 y.o. female with history of DM, chronic diarrhes, gait disorder with multiple falls, who was admitted 02/03/12 with increasing weakness, speech difficulties with right sided weakness that started the day after Christmas. Patient with history of mild right sided weakness for approximately two months and waxing and waning of dysarthria. She was noted to have acute on chronic renal failure with BUN 38/Cr 1.92 and was started on IVF for hydration and diuretics discontinued. MRI/MRA brain without acute changes. Neurology consulted and MRI C-spine with multifactorial cervical stenosis from C2/C3 to C6/C7 with moderate to severe foraminal stenosis. Speech and strength have improved since admission. MBS done 1/11 due to history of prior dysphagia and patient placed on D3, thin liquids. UCS negative. Patient with history of chronic diarrhea--C-diff check 02/05/11 was negative.   Objective: Vital Signs: Blood pressure 131/69, pulse 59, temperature 98.1 F (36.7 C), temperature source Oral, resp. rate 19, height 5\' 8"  (1.727 m), weight 81.2 kg (179 lb 0.2 oz), SpO2 93.00%. No results found. Results for orders placed during the hospital encounter of 02/08/12 (from the past 72 hour(s))  GLUCOSE, CAPILLARY     Status: Abnormal   Collection Time   02/08/12  4:58 PM      Component Value Range Comment   Glucose-Capillary 147 (*) 70 - 99 mg/dL   GLUCOSE, CAPILLARY     Status: Abnormal   Collection Time   02/08/12  9:12 PM      Component Value Range Comment   Glucose-Capillary 134 (*) 70 - 99 mg/dL   GLUCOSE, CAPILLARY     Status: Abnormal   Collection Time   02/09/12  5:25 AM      Component Value Range Comment   Glucose-Capillary 150 (*) 70 - 99 mg/dL    Comment 1 Notify RN     CBC WITH DIFFERENTIAL     Status: Abnormal   Collection Time   02/09/12  6:17 AM      Component Value Range Comment   WBC 8.4   4.0 - 10.5 K/uL    RBC 4.07  3.87 - 5.11 MIL/uL    Hemoglobin 11.7 (*) 12.0 - 15.0 g/dL    HCT 40.9 (*) 81.1 - 46.0 %    MCV 87.0  78.0 - 100.0 fL    MCH 28.7  26.0 - 34.0 pg    MCHC 33.1  30.0 - 36.0 g/dL    RDW 91.4  78.2 - 95.6 %    Platelets 247  150 - 400 K/uL    Neutrophils Relative 54  43 - 77 %    Neutro Abs 4.5  1.7 - 7.7 K/uL    Lymphocytes Relative 30  12 - 46 %    Lymphs Abs 2.5  0.7 - 4.0 K/uL    Monocytes Relative 10  3 - 12 %    Monocytes Absolute 0.8  0.1 - 1.0 K/uL    Eosinophils Relative 5  0 - 5 %    Eosinophils Absolute 0.5  0.0 - 0.7 K/uL    Basophils Relative 1  0 - 1 %    Basophils Absolute 0.1  0.0 - 0.1 K/uL   COMPREHENSIVE METABOLIC PANEL     Status: Abnormal   Collection Time   02/09/12  6:17 AM      Component Value Range Comment   Sodium 137  135 - 145 mEq/L    Potassium  3.9  3.5 - 5.1 mEq/L    Chloride 100  96 - 112 mEq/L    CO2 27  19 - 32 mEq/L    Glucose, Bld 159 (*) 70 - 99 mg/dL    BUN 20  6 - 23 mg/dL    Creatinine, Ser 4.54 (*) 0.50 - 1.10 mg/dL    Calcium 9.1  8.4 - 09.8 mg/dL    Total Protein 6.7  6.0 - 8.3 g/dL    Albumin 2.7 (*) 3.5 - 5.2 g/dL    AST 10  0 - 37 U/L    ALT 9  0 - 35 U/L    Alkaline Phosphatase 63  39 - 117 U/L    Total Bilirubin 0.3  0.3 - 1.2 mg/dL    GFR calc non Af Amer 34 (*) >90 mL/min    GFR calc Af Amer 40 (*) >90 mL/min   GLUCOSE, CAPILLARY     Status: Abnormal   Collection Time   02/09/12  6:44 AM      Component Value Range Comment   Glucose-Capillary 140 (*) 70 - 99 mg/dL      HEENT: normal Cardio: RRR Resp: CTA B/L GI: BS negative and Distention Extremity:  No Edema Skin:   Intact Neuro: Alert/Oriented, Abnormal Sensory reduced in feet, Normal Motor and Dysarthric Musc/Skel:  Normal Gen NAD   Assessment/Plan: 1. Functional deficits secondary to deconditioning after dehydration in a pt with chronic gait disorder associated with diabetic peripheral neuropathy and possible TIA which require 3+  hours per day of interdisciplinary therapy in a comprehensive inpatient rehab setting. Physiatrist is providing close team supervision and 24 hour management of active medical problems listed below. Physiatrist and rehab team continue to assess barriers to discharge/monitor patient progress toward functional and medical goals. FIM:                   Comprehension Comprehension Mode: Auditory Comprehension: 5-Understands complex 90% of the time/Cues < 10% of the time  Expression Expression Mode: Verbal Expression: 5-Expresses complex 90% of the time/cues < 10% of the time  Social Interaction Social Interaction: 5-Interacts appropriately 90% of the time - Needs monitoring or encouragement for participation or interaction.  Problem Solving Problem Solving: 5-Solves complex 90% of the time/cues < 10% of the time  Memory Memory: 5-Recognizes or recalls 90% of the time/requires cueing < 10% of the time  Medical Problem List and Plan:  1. DVT Prophylaxis/Anticoagulation: Pharmaceutical: Lovenox  2. Diabetic peripheral neuropathy: Continue neurontin 300 mg bid. May need dose adjusted if renal functions declines off IVF.  3. Mood: Seems motivated and upbeat. Will monitor for now.  4. Neuropsych: This patient is capable of making decisions on his/her own behalf.  5. HTN: monitor with bid checks.  6. DM type 2 with poor control: Monitor with AC/HS CBG checks. Continue lantus with meal coverage.  7. Chronic diastolic CHF: Check daily weights. Renal status much improved and IVF discontinued today. Monitor for signs of overload after and as we Continue to hold lasix. Add salt restrictions.  8. Diabetic foot ulcers: Continue foam dressing. Will add PRAFOs.. Shoes when OOB and pressure relief measures. Wounds are likley affecting balance as well   LOS (Days) 1 A FACE TO FACE EVALUATION WAS PERFORMED  KIRSTEINS,ANDREW E 02/09/2012, 9:34 AM

## 2012-02-09 NOTE — Progress Notes (Signed)
Social Work Assessment and Plan Social Work Assessment and Plan  Patient Details  Name: [name] MRN: [id number] Date of Birth: 1919/11/30  Today's Date: 02/09/2012  Problem List:  Patient Active Problem List  Diagnosis  . DIABETES MELLITUS, TYPE II, UNCONTROLLED  . DIABETIC PERIPHERAL NEUROPATHY  . HYPERTENSION, BENIGN ESSENTIAL  . UTI (lower urinary tract infection)  . Dehydration  . Hypothyroidism  . Hyponatremia  . Constipation  . Renal failure (ARF), acute on chronic  . Hyperkalemia  . Dysarthria  . Multifactorial gait disorder   Past Medical History:  Past Medical History  Diagnosis Date  . Myocardial infarction   . Coronary artery disease   . Hypertension   . Dysrhythmia   . Peripheral vascular disease   . Hypothyroidism   . Diabetes mellitus   . Presbyesophagus 04/23/2011  . Urinary retention with incomplete bladder emptying 04/23/2011  . UTI (lower urinary tract infection) 04/21/2011  . Diastolic CHF, chronic     Grade I diastolic dysfunction on Echo 04/26/11  . Anaphylaxis 04/25/2011    Thought to be from Rocephin   Past Surgical History:  Past Surgical History  Procedure Date  . Cholecystectomy   . Colonoscopy 01/15/2011    Procedure: COLONOSCOPY;  Surgeon: Theda Belfast, MD;  Location: [facility];  Service: Endoscopy;  Laterality: N/A;   Social History:  reports that she quit smoking about 30 years ago. Her smoking use included Cigarettes. She has never used smokeless tobacco. She reports that she drinks alcohol. She reports that she does not use illicit drugs.  Family / Support Systems Marital Status: Widow/Widower Patient Roles: Parent Children: Jill Poling  [phone] Other Supports: Hired caregiver-4hr a day Anticipated Caregiver: Unsure at this point Ability/Limitations of Caregiver: daughter works, aware we will recommend 24hr supervision level due to falling at home.  Caregiver there 4hr, daughter checks on.  Has been to [facility] several times after dishcarge form hospital Caregiver Availability: Intermittent Family Dynamics: Pt reports she and daughter are close, but daughter is busy with children and work.  Pt has been doing the best that she can and has someone few hours a day.    Social History Preferred language: English Religion: Presbyterian Cultural Background: No issues Education: [employer] Read: Yes Write: Yes Employment Status: Retired Fish farm manager Issues: No issues Guardian/Conservator: None-according to MD pt is capable of making her own decisions   Abuse/Neglect Physical Abuse: Denies Verbal Abuse: Denies Sexual Abuse: Denies Exploitation of patient/patient's resources: Denies Self-Neglect: Denies  Emotional Status Pt's affect, behavior adn adjustment status: [name] motivated but has health issues that cause her to have less feeling in her feet and makes her unsteady.  She hopes to be able to return home, but has not in the past and gone to [facility] several times recently. Recent Psychosocial Issues: Other medical issues-multiple falls at home Pyschiatric History: No history-deferred depression screen due to pt doing well and motivated.  She feels she is doing the best that she can for a 79 year old lady. Substance Abuse History: No issues  Patient / Family Perceptions, Expectations & Goals Pt/Family understanding of illness & functional limitations: [name] able to explain her condition and health issues.  She feels her wlaking may not get better and will need to deal with what function she has.  Daughter is supportive and involved with pt and her care. Premorbid pt/family roles/activities: Mother, grandmother, retiree, home owner, church member, etc Anticipated changes in roles/activities/participation: resume  Pt/family expectations/goals: Pt states: " I am hopeful I will be able to return home."  "My daughter wants me to go home from here."  [employer]: Other (Comment) (Hired aide 4hours a day) Premorbid Home Care/DME Agencies: None Transportation available at discharge: Aide and daughter Resource referrals recommended: Support group (specify) ([facility] Support Group)  Discharge Planning Living Arrangements: Alone Support Systems: Children;Church/faith community;Friends/neighbors;Other (Comment) (Hired caregiver) Type of Residence: Private residence Insurance Resources: Administrator (specify) (AARP secondary) Financial Resources: Restaurant manager, fast food Screen Referred: No Living Expenses: Own Money Management: Patient;Family Do you have any problems obtaining your medications?: No Home Management: Aide Patient/Family Preliminary Plans: Return home but aware team will probably recommend 24 hr supervision due to falls prior to admission.  Not able to extend caregiver's hours.  Has been to [facility] times after hosptializations Social Work Anticipated Follow Up Needs: HH/OP;SNF;Support Group DC Planning Additional Notes/Comments: Will see how pt does and come up with a safe discharge plan.  Clinical Impression Need to come up with a safe discharge plan between pt and daughter.  Pt has several medical issues that make her a high risk to fall at home and therefore need 24 hour supervision at discharge. Aware of discharge options and has been to a NH in the recent past.    Lucy Chris 02/09/2012, 10:01 AM

## 2012-02-09 NOTE — Care Management Note (Signed)
[facility] Individual Statement of Services  Patient Name:  [name]  Date:  02/09/2012  Welcome to the [facility].  Our goal is to provide you with an individualized program based on your diagnosis and situation, designed to meet your specific needs.  With this comprehensive rehabilitation program, you will be expected to participate in at least 3 hours of rehabilitation therapies Monday-Friday, with modified therapy programming on the weekends.  Your rehabilitation program will include the following services:  Physical Therapy (PT), Occupational Therapy (OT), 24 hour per day rehabilitation nursing, Therapeutic Recreaction (TR), Case Management (RN and Child psychotherapist), Rehabilitation Medicine, Nutrition Services and Pharmacy Services  Weekly team conferences will be held on Wednesday to discuss your progress.  Your RN Case Designer, television/film set will talk with you frequently to get your input and to update you on team discussions.  Team conferences with you and your family in attendance may also be held.  Expected length of stay: 7-10 days Overall anticipated outcome: mod/i level  Depending on your progress and recovery, your program may change.  Your RN Case Estate agent will coordinate services and will keep you informed of any changes.  Your RN Sports coach and SW names and contact numbers are listed  below.  The following services may also be recommended but are not provided by the [facility]:   Driving Evaluations  Home Health Rehabiltiation Services  Outpatient Rehabilitatation [facility]  Vocational Rehabilitation   Arrangements will be made to provide these services after discharge if needed.  Arrangements include referral to agencies that provide these services.  Your insurance has been verified to be: Medicare & AARP Your primary doctor is:  Dr [name]  Pertinent information will be shared  with your doctor and your insurance company.   Social Worker:  Dossie Der, [place] [phone]  Information discussed with and copy given to patient by: Lucy Chris, 02/09/2012, 10:03 AM

## 2012-02-09 NOTE — Evaluation (Signed)
Speech Language Pathology Assessment and Plan  Patient Details  Name: [name] MRN: [id number] Date of Birth: 1919-09-04  SLP Diagnosis: Dysphagia  Rehab Potential: Good ELOS: anticipated discharge 02/10/12   Today's Date: 02/09/2012 Time: 4132-4401 Time Calculation (min): 60 min  Problem List:  Patient Active Problem List  Diagnosis  . DIABETES MELLITUS, TYPE II, UNCONTROLLED  . DIABETIC PERIPHERAL NEUROPATHY  . HYPERTENSION, BENIGN ESSENTIAL  . UTI (lower urinary tract infection)  . Dehydration  . Hypothyroidism  . Hyponatremia  . Constipation  . Renal failure (ARF), acute on chronic  . Hyperkalemia  . Dysarthria  . Multifactorial gait disorder   Past Medical History:  Past Medical History  Diagnosis Date  . Myocardial infarction   . Coronary artery disease   . Hypertension   . Dysrhythmia   . Peripheral vascular disease   . Hypothyroidism   . Diabetes mellitus   . Presbyesophagus 04/23/2011  . Urinary retention with incomplete bladder emptying 04/23/2011  . UTI (lower urinary tract infection) 04/21/2011  . Diastolic CHF, chronic     Grade I diastolic dysfunction on Echo 04/26/11  . Anaphylaxis 04/25/2011    Thought to be from Rocephin   Past Surgical History:  Past Surgical History  Procedure Date  . Cholecystectomy   . Colonoscopy 01/15/2011    Procedure: COLONOSCOPY;  Surgeon: Theda Belfast, MD;  Location: [facility];  Service: Endoscopy;  Laterality: N/A;    Assessment / Plan / Recommendation Clinical Impression  [name] is a 79 y.o. female with history of DM, chronic diarrhea, gait disorder with multiple falls, who was admitted 02/03/12 with increasing weakness, speech difficulties with right sided weakness that started the day after Christmas. Patient with history of mild right sided weakness for approximately two months and waxing and waning of dysarthria. MRI/MRA brain without acute changes. Neurology consulted and MRI C-spine with  multifactorial cervical stenosis from C2/C3 to C6/C7 with moderate to severe foraminal stenosis. Speech and strength have improved since admission. MBS done 1/11 due to history of prior dysphagia and patient placed on Dys.3 texture and thin liquid diet. Patient admitted to [facility] 02/08/12.  Upon evaluation today patient presents with mild oropharyngeal based dysphagia characterized by weakness and swallow delay.  As a result it is recommended that this patient receive skilled SLP services 3 times during her CIR stay to address diet toleration, strengthening exercise and family education prior to discharge home.      SLP Assessment  Patient will need skilled Speech Lanaguage Pathology Services during CIR admission    Recommendations  Diet Recommendations: Dysphagia 3 (Mechanical Soft);Thin liquid Liquid Administration via: Cup;Straw Medication Administration: Whole meds with puree Supervision: Patient able to self feed;Intermittent supervision to cue for compensatory strategies Compensations: Slow rate;Small sips/bites;Check for pocketing;Multiple dry swallows after each bite/sip;Follow solids with liquid Postural Changes and/or Swallow Maneuvers: Seated upright 90 degrees;Upright 30-60 min after meal Oral Care Recommendations: Oral care BID Patient destination: Home Follow up Recommendations: 24 hour supervision/assistance Equipment Recommended: None recommended by SLP    SLP Frequency 3 out of 7 days   SLP Treatment/Interventions Dysphagia/aspiration precaution training;Internal/external aids;Functional tasks;Environmental controls;Patient/family education    Pain Pain Assessment Pain Assessment: No/denies pain Prior Functioning Cognitive/Linguistic Baseline: Information not available  Short Term Goals: Week 1: SLP Short Term Goal 1 (Week 1): Patient will consume recommended diet with modified independence for use of safe swallow compensatory strategies.  SLP Short  Term Goal 2 (Week 1): Patient will perform base  of tongue and pharyngeal strengthening exercises with supervision level verbal cues.  See FIM for current functional status Refer to Care Plan for Long Term Goals  Recommendations for other services: None  Discharge Criteria: Patient will be discharged from SLP if patient refuses treatment 3 consecutive times without medical reason, if treatment goals not met, if there is a change in medical status, if patient makes no progress towards goals or if patient is discharged from hospital.  The above assessment, treatment plan, treatment alternatives and goals were discussed and mutually agreed upon: by patient  Fae Pippin, M.A., CCC-SLP [phone]  [name] 02/09/2012, 5:00 PM

## 2012-02-09 NOTE — Progress Notes (Signed)
Physical Therapy Session Note  Patient Details  Name: SHERY WAUNEKA MRN: [id number] Date of Birth: 19-Dec-1919  Today's Date: 02/09/2012 Time: 4098-1191 Time Calculation (min): 24 min  Skilled Therapeutic Interventions/Progress Updates:   Gait training on unit x 160' and 120' with RW and min@, decreased speed and increased lateral sway.  15 steps with 2 rails, one step at a time, 4-6" ht. With min@.   Therapy Documentation Precautions:  Precautions Precautions: Fall Precaution Comments: decreased sensation bil feet Restrictions Weight Bearing Restrictions: No  Pain:  No pain.   See FIM for current functional status  Therapy/Group: Individual Therapy  Georges Mouse 02/09/2012, 3:39 PM

## 2012-02-09 NOTE — Evaluation (Addendum)
Physical Therapy Assessment and Plan  Patient Details  Name: CLEOTILDE SPADACCINI MRN: [id number] Date of Birth: [birth year]  PT Diagnosis: Abnormal posture, Difficulty walking, Dizziness and giddiness, Edema, Hemiparesis dominant, Impaired cognition, Impaired sensation and Muscle weakness Rehab Potential: Good ELOS: 7-10 days   Today's Date: 02/09/2012 Time: 0800-0900 Time Calculation (min): 60 min  Problem List:  Patient Active Problem List  Diagnosis  . DIABETES MELLITUS, TYPE II, UNCONTROLLED  . DIABETIC PERIPHERAL NEUROPATHY  . HYPERTENSION, BENIGN ESSENTIAL  . UTI (lower urinary tract infection)  . Dehydration  . Hypothyroidism  . Hyponatremia  . Constipation  . Renal failure (ARF), acute on chronic  . Hyperkalemia  . Dysarthria  . Multifactorial gait disorder    Past Medical History:  Past Medical History  Diagnosis Date  . Myocardial infarction   . Coronary artery disease   . Hypertension   . Dysrhythmia   . Peripheral vascular disease   . Hypothyroidism   . Diabetes mellitus   . Presbyesophagus 04/23/2011  . Urinary retention with incomplete bladder emptying 04/23/2011  . UTI (lower urinary tract infection) 04/21/2011  . Diastolic CHF, chronic     Grade I diastolic dysfunction on Echo 04/26/11  . Anaphylaxis 04/25/2011    Thought to be from Rocephin   Past Surgical History:  Past Surgical History  Procedure Date  . Cholecystectomy   . Colonoscopy 01/15/2011    Procedure: COLONOSCOPY;  Surgeon: Theda Belfast, MD;  Location: [facility];  Service: Endoscopy;  Laterality: N/A;    Assessment & Plan Clinical Impression: JESSY CALIXTE is a 79 y.o. female with history of DM, chronic diarrhes, gait disorder with multiple falls, who was admitted 02/03/12 with increasing weakness, speech difficulties with right sided weakness that started the day after Christmas. Patient with history of mild right sided weakness for approximately two months and waxing and waning of  dysarthria. She was noted to have acute on chronic renal failure with BUN 38/Cr 1.92 and was started on IVF for hydration and diuretics discontinued. MRI/MRA brain without acute changes. Neurology consulted and MRI C-spine with multifactorial cervical stenosis from C2/C3 to C6/C7 with moderate to severe foraminal stenosis. Speech and strength have improved since admission. MBS done 1/11 due to history of prior dysphagia and patient placed on D3, thin liquids. UCS negative. Patient with history of chronic diarrhea her entire adult life--C-diff check 02/05/11 was negative. She describes vertigo intermittently x 10 years, with resulting falls with injuries. Patient transferred to CIR on 02/08/2012 .   Patient currently requires min with mobility secondary to muscle weakness, decreased cardiorespiratoy endurance, impaired timing and sequencing and decreased coordination and vestibular hx, to be evaluated..  Prior to hospitalization, patient was modified independent  with mobility and lived  Alone in a House home; personal care attendant helped her with shower and house keeping 4 hours per day.  Home access is 2 STE, 2 rails   . Per pt, she has vertigo with resulting falls, some with serious injury (skull fx 3 years ago) Patient will benefit from skilled PT intervention to maximize safe functional mobility, minimize fall risk and decrease caregiver burden for planned discharge home with intermittent assist.  Anticipate patient will benefit from follow up [facility] at discharge.  PT - End of Session Activity Tolerance: Tolerates < 10 min activity, no significant change in vital signs Endurance Deficit: Yes Endurance Deficit Description: pt with dyspnea 2/4 during functional transfers  PT Assessment Rehab Potential: Good Barriers to Discharge: Decreased caregiver support  PT Plan PT Intensity: Minimum of 1-2 x/day ,45 to 90 minutes PT Frequency: 5 out of 7 days PT Duration Estimated Length of Stay: 7-10 days PT  Treatment/Interventions: Ambulation/gait training;Balance/vestibular training;Discharge planning;DME/adaptive equipment instruction;Patient/family education;Neuromuscular re-education;Functional mobility training;Splinting/orthotics;Stair training;UE/LE Strength taining/ROM;Therapeutic Exercise;Therapeutic Activities;Wheelchair propulsion/positioning PT Recommendation Recommendations for Other Services: Vestibular eval Follow Up Recommendations: Home health PT Patient destination: Home (pt insists that she must d/c home with same level of assist) Equipment Recommended: Rolling walker with 5" wheels Equipment Details: may need 2 wheeled RW  PT Evaluation Precautions/Restrictions Precautions Precautions: Fall Precaution Comments: decreased sensation bil feet Restrictions Weight Bearing Restrictions: No General Chart Reviewed: Yes Additional Pertinent History: pt has had chronic diarrhea her entire adult life, and currently has about 5 episodes per day; she used toilet 3 times during 1 hour eval Vital Signs  Pain Pain Assessment Pain Assessment: No/denies pain Home Living/Prior Functioning Home Living Lives With: Alone Available Help at Discharge: Personal care attendant (from 9-530 daily.) Type of Home: House Home Layout: One level Home Adaptive Equipment: Environmental consultant - four wheeled Prior Function Able to Take Stairs?: Yes Driving: No Vocation: Retired Optometrist - History Baseline Vision: Wears glasses all the time Patient Visual Report: No change from baseline;Other (comment) (Pt reports needing to go to the eye MD for yearly exam) Vision - Assessment Eye Alignment: Within Functional Limits Vision Assessment: Vision not tested Perception Perception: Within Functional Limits Praxis Praxis: Intact  Cognition Overall Cognitive Status: Appears within functional limits for tasks assessed Arousal/Alertness: Awake/alert Orientation Level: Oriented X4 Attention:  Sustained Focused Attention: Appears intact Sustained Attention: Appears intact Memory: Appears intact (during basic selfcare tasks, able to recall use of call butt) Sensation Sensation Light Touch: Impaired Detail Light Touch Impaired Details: Absent RLE;Absent LLE (absent bil toes, most of R foot) Additional Comments: Pt reports a slight decrease in light touch sensation in the ulnar border of her right hand. Coordination Gross Motor Movements are Fluid and Coordinated: Yes Fine Motor Movements are Fluid and Coordinated: No Coordination and Movement Description: Pt with slight decreased FM manipulation including opposition in the right hand.  Able to oppose thumb to all digits except the 4th and 5th. Finger Nose Finger Test: WFLs bilaterally Heel Shin Test: decreased speed, accuracy and excurion bil Motor  Motor Motor: Abnormal postural alignment and control Motor - Skilled Clinical Observations: trunk flexion and cervical flexion in standing   Mobility Bed Mobility Bed Mobility: Supine to Sit Supine to Sit: 5: Supervision;HOB elevated;With rails Transfers Sit to Stand: 4: Min guard;With upper extremity assist;From bed;From toilet Sit to Stand Details: Verbal cues for technique;Manual facilitation for weight shifting Sit to Stand Details (indicate cue type and reason): Pt needs mod instructional cueing for hand placement with sit to stand and stand to sit.  Wants to pull up on the walker at times and also tends to leave hands on the walker when sitting and "flop" into the chair or the edge of the bed. Stand to Sit: 4: Min assist Stand to Sit Details (indicate cue type and reason): Verbal cues for technique;Manual facilitation for weight shifting (decreased eccentric control) Locomotion  Ambulation Ambulation: Yes Ambulation/Gait Assistance: 4: Min guard Ambulation Distance (Feet): 15 Feet Assistive device: Rolling walker Ambulation/Gait Assistance Details: cues for turning,  safety regarding hand placement Gait Gait: Yes Gait Pattern: Trunk flexed;Narrow base of support;Step-through pattern;Decreased stride length Gait velocity: decreased Stairs / Additional Locomotion Stairs: No Wheelchair Mobility Wheelchair Mobility: No  Trunk/Postural Assessment  Cervical Assessment Cervical Assessment:  Exceptions to [facility] (rotation L llimited by pain) Cervical AROM Overall Cervical AROM Comments: Pt with cervical flexion and protraction in standing. Thoracic Assessment Thoracic Assessment: Exceptions to [facility] Thoracic AROM Overall Thoracic AROM Comments: Thoracic kyphosis and rounding in standing. Lumbar Assessment Lumbar Assessment: Exceptions to [facility] Lumbar AROM Overall Lumbar AROM Comments: L rotation limited by L neck pain Postural Control Postural Control: Deficits on evaluation (pt drifts L in sitting during conversation)  Balance Balance Balance Assessed: Yes (pt describes vertigo x 10 years, with  falls with injuries) Static Sitting Balance Static Sitting - Balance Support: Feet supported;No upper extremity supported Static Sitting - Level of Assistance: 7: Independent Static Standing Balance Static Standing - Balance Support: No upper extremity supported Static Standing - Level of Assistance: 4: Min assist Dynamic Standing Balance Dynamic Standing - Balance Support: Left upper extremity supported Dynamic Standing - Level of Assistance: 3: Mod assist;Other (comment) (without use of assistive device) Extremity Assessment  RUE Assessment RUE Assessment: Exceptions to [facility] RUE AROM (degrees) RUE Overall AROM Comments: Shoulder flexion AROM 0-120 degrees, pt with history of right shoulder fracture in the past.  Strength throughout 4/5 in the shoulder and elbow LUE Assessment LUE Assessment: Within Functional Limits (AROM WFLS and strength 4/5 throughout, grip = 35 lbs) RLE Assessment RLE Assessment:  (pitting edema mild) RLE Strength RLE Overall Strength  Comments: hip flexion 2+/5, hip adduction/abduction grossly 4/5; knee flexion/extension  and ankle DF grossly 4/5; LLE Assessment LLE Assessment:  (pitting edema mild)  FIM:  FIM - Bed/Chair Transfer Bed/Chair Transfer Assistive Devices: Bed rails;HOB elevated Bed/Chair Transfer: 5: Supine > Sit: Supervision (verbal cues/safety issues);4: Bed > Chair or W/C: Min A (steadying Pt. > 75%) FIM - Locomotion: Ambulation Ambulation/Gait Assistance: 4: Min guard   Refer to Care Plan for Long Term Goals  Recommendations for other services: None  Discharge Criteria: Patient will be discharged from PT if patient refuses treatment 3 consecutive times without medical reason, if treatment goals not met, if there is a change in medical status, if patient makes no progress towards goals or if patient is discharged from hospital.  The above assessment, treatment plan, treatment alternatives and goals were discussed and mutually agreed upon: by patient  Treatment today: gait in room with RW x 10' x 5, due to bowel urgency/diarrhea; 3 trips to BR during eval.  Toilet transfers with min assist, poor eccentric control.  toileting with assistance.    Yulian Gosney 02/09/2012, 11:49 AM

## 2012-02-09 NOTE — Progress Notes (Signed)
Patient information reviewed and entered into eRehab system by Brendaly Townsel, RN, CRRN, PPS Coordinator.  Information including medical coding and functional independence measure will be reviewed and updated through discharge.     Per nursing patient was given "Data Collection Information Summary for Patients in Inpatient Rehabilitation Facilities with attached "Privacy Act Statement-Health Care Records" upon admission.  

## 2012-02-09 NOTE — Evaluation (Signed)
Occupational Therapy Assessment and Plan  Patient Details  Name: [name] MRN: [id number] Date of Birth: March 26, 1919  OT Diagnosis: abnormal posture and muscle weakness (generalized) Rehab Potential: Rehab Potential: Excellent ELOS: 10-14 days   Today's Date: 02/09/2012 Time: 0800-0900 Time Calculation (min): 60 min  Problem List:  Patient Active Problem List  Diagnosis  . DIABETES MELLITUS, TYPE II, UNCONTROLLED  . DIABETIC PERIPHERAL NEUROPATHY  . HYPERTENSION, BENIGN ESSENTIAL  . UTI (lower urinary tract infection)  . Dehydration  . Hypothyroidism  . Hyponatremia  . Constipation  . Renal failure (ARF), acute on chronic  . Hyperkalemia  . Dysarthria  . Multifactorial gait disorder    Past Medical History:  Past Medical History  Diagnosis Date  . Myocardial infarction   . Coronary artery disease   . Hypertension   . Dysrhythmia   . Peripheral vascular disease   . Hypothyroidism   . Diabetes mellitus   . Presbyesophagus 04/23/2011  . Urinary retention with incomplete bladder emptying 04/23/2011  . UTI (lower urinary tract infection) 04/21/2011  . Diastolic CHF, chronic     Grade I diastolic dysfunction on Echo 04/26/11  . Anaphylaxis 04/25/2011    Thought to be from Rocephin   Past Surgical History:  Past Surgical History  Procedure Date  . Cholecystectomy   . Colonoscopy 01/15/2011    Procedure: COLONOSCOPY;  Surgeon: Theda Belfast, MD;  Location: [facility];  Service: Endoscopy;  Laterality: N/A;    Assessment & Plan Clinical Impression: Patient is a 79 y.o. year old female with recent admission to the hospital on 02/03/12 with increasing weakness, speech difficulties with right sided weakness that started the day after Christmas. Patient with history of mild right sided weakness for approximately two months and waxing and waning of dysarthria. She was noted to have acute on chronic renal failure with BUN 38/Cr 1.92 and was started on IVF for hydration and  diuretics discontinued. MRI/MRA brain without acute changes. Neurology consulted and MRI C-spine with multifactorial cervical stenosis from C2/C3 to C6/C7 with moderate to severe foraminal stenosis.  Patient transferred to CIR on 02/08/2012 .    Patient currently requires min with basic self-care skills secondary to muscle weakness and decreased coordination.  Prior to hospitalization, patient could complete ADLS and IADLS with modified independence to supervision. She had an aide that assisted with home management tasks for 4 hours 5 days a week.  Patient will benefit from skilled intervention to decrease level of assist with basic self-care skills, increase independence with basic self-care skills and increase level of independence with iADL prior to discharge home independently.  Anticipate patient will require intermittent supervision and   follow up home health.  OT - End of Session Activity Tolerance: Tolerates 10 - 20 min activity with multiple rests Endurance Deficit: Yes Endurance Deficit Description: pt with dyspnea 2/4 during functional transfers and dressing tasks. OT Assessment Rehab Potential: Excellent Barriers to Discharge: Decreased caregiver support OT Plan OT Intensity: Minimum of 1-2 x/day, 45 to 90 minutes OT Frequency: 5 out of 7 days OT Duration/Estimated Length of Stay: 10-14 days OT Treatment/Interventions: Balance/vestibular training;Community reintegration;Discharge planning;Neuromuscular re-education;Functional mobility training;DME/adaptive equipment instruction;Patient/family education;Self Care/advanced ADL retraining;Therapeutic Activities;Therapeutic Exercise;UE/LE Strength taining/ROM;UE/LE Coordination activities OT Recommendation Patient destination: Home Follow Up Recommendations: Home health OT (depending on progress) Equipment Recommended: 3 in 1 bedside comode  OT Evaluation Precautions/Restrictions  Precautions Precautions: Fall Restrictions Weight  Bearing Restrictions: No  Pain Pain Assessment Pain Assessment: No/denies pain  ADL ADL Eating:  Modified independent Where Assessed-Eating: Chair Grooming: Supervision/safety Where Assessed-Grooming: Chair Upper Body Bathing: Setup Where Assessed-Upper Body Bathing: Shower Lower Body Bathing: Minimal assistance Where Assessed-Lower Body Bathing: Shower Upper Body Dressing: Minimal assistance Where Assessed-Upper Body Dressing: Chair;Sitting at sink Lower Body Dressing: Minimal assistance Where Assessed-Lower Body Dressing: Chair;Standing at sink;Sitting at sink Toileting: Minimal assistance Where Assessed-Toileting: Bedside Commode Toilet Transfer: Minimal assistance Toilet Transfer Method: Ambulating Tub/Shower Transfer: Minimal assistance Tub/Shower Transfer Method: Ambulating Tub/Shower Equipment: Insurance underwriter: Minimal assistance Film/video editor Method: Designer, industrial/product: Emergency planning/management officer ADL Comments: Pt with overall min assist needed for all sit to stand transitions.  Decreased ability to donn her left sock as well. Vision/Perception  Vision - History Baseline Vision: Wears glasses all the time Patient Visual Report: No change from baseline;Other (comment) (Pt reports needing to go to the eye MD for yearly exam) Vision - Assessment Eye Alignment: Within Functional Limits Vision Assessment: Vision not tested Perception Perception: Within Functional Limits Praxis Praxis: Intact  Cognition Overall Cognitive Status: Appears within functional limits for tasks assessed Arousal/Alertness: Awake/alert Orientation Level: Oriented X4 Attention: Sustained Focused Attention: Appears intact Sustained Attention: Appears intact Memory: Appears intact (during basic selfcare tasks, able to recall use of call butt) Sensation Sensation Light Touch: Impaired Detail Light Touch Impaired Details: Impaired RUE Additional  Comments: Pt reports a slight decrease in light touch sensation in the ulnar border of her right hand. Coordination Gross Motor Movements are Fluid and Coordinated: Yes Fine Motor Movements are Fluid and Coordinated: No Coordination and Movement Description: Pt with slight decreased FM manipulation including opposition in the right hand.  Able to oppose thumb to all digits except the 4th and 5th. Finger Nose Finger Test: WFLs bilaterally Motor  Motor Motor: Abnormal postural alignment and control Motor - Skilled Clinical Observations: trunk flexion and cervical flexion in standing Mobility  Bed Mobility Bed Mobility: Supine to Sit Supine to Sit: 5: Supervision;HOB elevated;With rails Transfers Sit to Stand: 4: Min assist;With upper extremity assist;From bed Sit to Stand Details: Verbal cues for technique;Manual facilitation for weight shifting Sit to Stand Details (indicate cue type and reason): Pt needs mod instructional cueing for hand placement with sit to stand and stand to sit.  Wants to pull up on the walker at times and also tends to leave hands on the walker when sitting and "flop" into the chair or the edge of the bed. Stand to Sit: 4: Min assist;Without upper extremity assist;To chair/3-in-1 Stand to Sit Details (indicate cue type and reason): Verbal cues for technique;Manual facilitation for weight shifting  Trunk/Postural Assessment  Cervical Assessment Cervical Assessment: Exceptions to [facility] Cervical AROM Overall Cervical AROM Comments: Pt with cervical flexion and protraction in standing. Thoracic Assessment Thoracic Assessment: Exceptions to [facility] Thoracic AROM Overall Thoracic AROM Comments: Thoracic kyphosis and rounding in standing.  Balance Balance Balance Assessed: Yes Static Standing Balance Static Standing - Balance Support: No upper extremity supported Static Standing - Level of Assistance: 4: Min assist Dynamic Standing Balance Dynamic Standing - Balance  Support: Left upper extremity supported Dynamic Standing - Level of Assistance: 3: Mod assist;Other (comment) (without use of assistive device) Extremity/Trunk Assessment RUE Assessment RUE Assessment: Exceptions to [facility] RUE AROM (degrees) RUE Overall AROM Comments: Shoulder flexion AROM 0-120 degrees, pt with history of right shoulder fracture in the past.  Strength throughout 4/5 in the shoulder and elbow LUE Assessment LUE Assessment: Within Functional Limits (AROM WFLS and strength 4/5 throughout, grip =  35 lbs)  See FIM for current functional status Refer to Care Plan for Long Term Goals  Recommendations for other services: None  Discharge Criteria: Patient will be discharged from OT if patient refuses treatment 3 consecutive times without medical reason, if treatment goals not met, if there is a change in medical status, if patient makes no progress towards goals or if patient is discharged from hospital.  The above assessment, treatment plan, treatment alternatives and goals were discussed and mutually agreed upon: by patient During session began working on selfcare retraining at shower level.  Focus on dynamic and static standing balance as well as walker safety and coordination.  Mycal Conde OTR/L 02/09/2012, 10:43 AM

## 2012-02-10 ENCOUNTER — Inpatient Hospital Stay (HOSPITAL_COMMUNITY): Payer: Medicare Other | Admitting: Speech Pathology

## 2012-02-10 ENCOUNTER — Encounter (HOSPITAL_COMMUNITY): Payer: Medicare Other | Admitting: Occupational Therapy

## 2012-02-10 ENCOUNTER — Inpatient Hospital Stay (HOSPITAL_COMMUNITY): Payer: Medicare Other | Admitting: Occupational Therapy

## 2012-02-10 ENCOUNTER — Inpatient Hospital Stay (HOSPITAL_COMMUNITY): Payer: Medicare Other

## 2012-02-10 DIAGNOSIS — M47812 Spondylosis without myelopathy or radiculopathy, cervical region: Secondary | ICD-10-CM

## 2012-02-10 DIAGNOSIS — R5381 Other malaise: Secondary | ICD-10-CM

## 2012-02-10 DIAGNOSIS — R269 Unspecified abnormalities of gait and mobility: Secondary | ICD-10-CM

## 2012-02-10 LAB — GLUCOSE, CAPILLARY
Glucose-Capillary: 155 mg/dL — ABNORMAL HIGH (ref 70–99)
Glucose-Capillary: 131 mg/dL — ABNORMAL HIGH (ref 70–99)
Glucose-Capillary: 155 mg/dL — ABNORMAL HIGH (ref 70–99)

## 2012-02-10 NOTE — Progress Notes (Signed)
Occupational Therapy Session Note  Patient Details  Name: [name] MRN: [id number] Date of Birth: [birth year]  Today's Date: 02/10/2012 Time: 4696-2952 Time Calculation (min): 31 min  Short Term Goals: Week 1:  OT Short Term Goal 1 (Week 1): Pt will perform all bathing with supervision in the shower sit to stand with use of seat. OT Short Term Goal 2 (Week 1): Pt will perform all dressing sit to stand for 2 consecutive sessions with supervision.   OT Short Term Goal 3 (Week 1): Pt will perform all aspects of toileting including transfer with supervision including transfer. OT Short Term Goal 4 (Week 1): Pt will perform walk-in shower transfer with RW and shower seat with supervision. OT Short Term Goal 5 (Week 1): Pt will perform FM coordination exercises with independence.  Skilled Therapeutic Interventions/Progress Updates:    Practiced walk-in shower transfers using the RW and simulated shower edge.  Able to perform with use of grab bar as well and min assist.  Educated pt on proper technique for stand to sit.  Pt tends to not flex trunk enough to sit down in a controlled fashion and drops into the chair or surface she is transferring to.  Also practiced bed transfers to regular full size bed in the gym.  Pt able to get in and out of the bed with supervision.  At end of session educated her on FM coordination tasks for the LUE to perform in her room including shuffling and sorting cards and turning pages backwards and forward in her magazine.   Therapy Documentation Precautions:  Precautions Precautions: Fall Precaution Comments: hx multiple falls Restrictions Weight Bearing Restrictions: No  Pain: Pain Assessment Pain Assessment: No/denies pain ADL: See FIM for current functional status  Therapy/Group: Individual Therapy  Teodora Baumgarten OTR/L 02/10/2012, 4:23 PM

## 2012-02-10 NOTE — Progress Notes (Signed)
Social Work Patient ID: [name], female   DOB: [birth year], 79 y.o.   MRN: [id number] Met with pt and spoke with daughter via telephone to inform of team conference goals-mod/i -supervision level and discharge 1/23. [name] very pleased and feels she is doing better.  She is planning on visiting her great granddaughter 1/26 for her 1st birthday and is so glad to be able to Do this.  Daughter is pleased with mother's progress and will work on her coming home next thurs.

## 2012-02-10 NOTE — Patient Care Conference (Signed)
Inpatient RehabilitationTeam Conference and Plan of Care Update Date: 02/09/2012   Time: 11:40 AM    Patient Name: [name]      Medical Record Number: [id number]  Date of Birth: 1919/04/30 Sex: Female         Room/Bed: 4145/4145-01 Payor Info: Payor: MEDICARE  Plan: MEDICARE PART A AND B  Product Type: *No Product type*     Admitting Diagnosis: L CVA  Admit Date/Time:  02/08/2012  4:45 PM Admission Comments: No comment available   Primary Diagnosis:  Multifactorial gait disorder Principal Problem: Multifactorial gait disorder  Patient Active Problem List   Diagnosis Date Noted  . Multifactorial gait disorder 02/08/2012  . Dysarthria 02/06/2012  . Renal failure (ARF), acute on chronic 02/04/2012  . Hyperkalemia 02/04/2012  . Constipation 12/25/2011  . Hyponatremia 04/22/2011  . UTI (lower urinary tract infection) 04/21/2011  . Dehydration 04/21/2011  . Hypothyroidism 04/21/2011  . DIABETIC PERIPHERAL NEUROPATHY 12/14/2006  . DIABETES MELLITUS, TYPE II, UNCONTROLLED 12/08/2006  . HYPERTENSION, BENIGN ESSENTIAL 12/08/2006    Expected Discharge Date: Expected Discharge Date: 02/17/12  Team Members Present: Physician leading conference: Dr. Claudette Laws Social Worker Present: Dossie Der, LCSW Nurse Present: Other (comment) Milly Jakob Rcom-RN) PT Present: Edman Circle, PT;Becky Henrene Dodge, PT;Other (comment) Clarisse Gouge ripa-PT) OT Present: Bretta Bang, Verlene Mayer, OT SLP Present: Fae Pippin, SLP Other (Discipline and Name): Jessy Oto     Current Status/Progress Goal Weekly Team Focus  Medical   dehydrated now maintaining off IVF  monitor I/O  encourage po intake, monitor BP   Bowel/Bladder   Continent of bowel and bladder. Wears pull-up. Frequent urination. LBM 02/07/2012.  Remain continent of bowel and bladder.  Monitor.   Swallow/Nutrition/ Hydration   Dys.3 textures and thin liquids   least restrictive p.o. intake  oropharyngeal strengthening  exercises; carryover use of compensatory strategies   ADL's     new eval        Mobility     new eval        Communication   suspect close to baseline per patient report         Safety/Cognition/ Behavioral Observations  suspect close to baseline         Pain   Denies any pain.  n/a  Monitor for any onset of pain.   Skin   Ecchymosis abdomen right and left.; scaterred bruising;;l eft heel with foam dressing  for skin protection; right heel with foam dressing stage I skin peeling ; 4th toe neosporin applied, and right big toe with callus.  No new skin breakdown and infection.  Monitor and assess skin q shift.      *See Interdisciplinary Assessment and Plan and progress notes for long and short-term goals  Barriers to Discharge: needs to be mod I    Possible Resolutions to Barriers:  may need slightly longer LOS    Discharge Planning/Teaching Needs:   Plans to return home with 4 hr caregiver and be mod/i level      Team Discussion:  Pt with pre-existing gait disorder. Doing better with safety techniques. Goals-mod/i-sup level  Revisions to Treatment Plan:  New eval   Continued Need for Acute Rehabilitation Level of Care: The patient requires daily medical management by a physician with specialized training in physical medicine and rehabilitation for the following conditions: Daily direction of a multidisciplinary physical rehabilitation program to ensure safe treatment while eliciting the highest outcome that is of practical value to the patient.: Yes Daily medical management of  patient stability for increased activity during participation in an intensive rehabilitation regime.: Yes Daily analysis of laboratory values and/or radiology reports with any subsequent need for medication adjustment of medical intervention for : Neurological problems  Mariangela Heldt, Lemar Livings 02/10/2012, 11:58 AM

## 2012-02-10 NOTE — Progress Notes (Signed)
Occupational Therapy Session Note  Patient Details  Name: KAMYLAH MANZO MRN: [id number] Date of Birth: 1919-05-15  Today's Date: 02/10/2012 Time: 0800-0859 Time Calculation (min): 59 min  Short Term Goals: Week 1:  OT Short Term Goal 1 (Week 1): Pt will perform all bathing with supervision in the shower sit to stand with use of seat. OT Short Term Goal 2 (Week 1): Pt will perform all dressing sit to stand for 2 consecutive sessions with supervision.   OT Short Term Goal 3 (Week 1): Pt will perform all aspects of toileting including transfer with supervision including transfer. OT Short Term Goal 4 (Week 1): Pt will perform walk-in shower transfer with RW and shower seat with supervision. OT Short Term Goal 5 (Week 1): Pt will perform FM coordination exercises with independence.  Skilled Therapeutic Interventions/Progress Updates:    Pt seen for OT bathing and dressing session.  Min assist needed for transfer to walk-in shower using her RW.  Needs min instructional cueing to sit down in a controlled fashion, using UEs to help support her, instead of flopping down on surfaces.  Overall min steady assist for all LB selfcare tasks sit to stand.  Needs assistance with pulling her sports bra down over her back.  Able to perform all other aspects of dressing with supervision, except sit to stand.    Therapy Documentation Precautions:  Precautions Precautions: Fall Precaution Comments: decreased sensation bil feet Restrictions Weight Bearing Restrictions: No  Vital Signs: Therapy Vitals Temp: 98.1 F (36.7 C) Temp src: Oral Pulse Rate: 64  Resp: 19  BP: 137/66 mmHg Patient Position, if appropriate: Sitting Oxygen Therapy SpO2: 95 % O2 Device: None (Room air) Pain: Pain Assessment Pain Assessment: No/denies pain ADL: See FIM for current functional status  Therapy/Group: Individual Therapy  Christene Pounds OTR/L 02/10/2012, 8:59 AM

## 2012-02-10 NOTE — Progress Notes (Signed)
Physical Therapy Session Note  Patient Details  Name: [name] MRN: [id number] Date of Birth: 04-Jun-1919  Today's Date: 02/10/2012 Time: 4098-1191 Time Calculation (min): 25 min  Short Term Goals: Week 1: = LTGs    Skilled Therapeutic Interventions/Progress Updates:     neuromuscular re-education via forced use, demo, VCs, tactile cues for: -midline orientation in static standing; pt has L lateral lean which she is somewhat aware of -R heel strike while standing with RW -mini squats in R and L modified tandem positions, Bil Ues supported -backwards ambulation to encourage R knee flexion, mod assist for balance -calf raises, with minimal excursion R foot -closed chain hip abduction in standing, mod assist for balance. Therapy Documentation Precautions:  Precautions Precautions: Fall Precaution Comments: hx multiple falls Restrictions Weight Bearing Restrictions: No Pain: Pain Assessment Pain Assessment: No/denies pain     See FIM for current functional status  Therapy/Group: Individual Therapy  Shatha Hooser 02/10/2012, 4:08 PM

## 2012-02-10 NOTE — Progress Notes (Signed)
Patient ID: [name], female   DOB: Nov 21, 1919, 79 y.o.   MRN: [id number]  Subjective/Complaints: 79 y.o. female with history of DM, chronic diarrhes, gait disorder with multiple falls, who was admitted 02/03/12 with increasing weakness, speech difficulties with right sided weakness that started the day after Christmas. Patient with history of mild right sided weakness for approximately two months and waxing and waning of dysarthria. She was noted to have acute on chronic renal failure with BUN 38/Cr 1.92 and was started on IVF for hydration and diuretics discontinued. MRI/MRA brain without acute changes. Neurology consulted and MRI C-spine with multifactorial cervical stenosis from C2/C3 to C6/C7 with moderate to severe foraminal stenosis. Speech and strength have improved since admission. MBS done 1/11 due to history of prior dysphagia and patient placed on D3, thin liquids. UCS negative. Patient with history of chronic diarrhea--C-diff check 02/05/11 was negative.  No new issues.  TIred after therapy  Review of Systems  Constitutional: Positive for malaise/fatigue.  Gastrointestinal: Positive for diarrhea.       Chronic diarrhea for 70 years  All other systems reviewed and are negative.    Objective: Vital Signs: Blood pressure 137/70, pulse 68, temperature 97.8 F (36.6 C), temperature source Oral, resp. rate 17, height 5\' 8"  (1.727 m), weight 81.5 kg (179 lb 10.8 oz), SpO2 95.00%. No results found. Results for orders placed during the hospital encounter of 02/08/12 (from the past 72 hour(s))  GLUCOSE, CAPILLARY     Status: Abnormal   Collection Time   02/08/12  4:58 PM      Component Value Range Comment   Glucose-Capillary 147 (*) 70 - 99 mg/dL   GLUCOSE, CAPILLARY     Status: Abnormal   Collection Time   02/08/12  9:12 PM      Component Value Range Comment   Glucose-Capillary 134 (*) 70 - 99 mg/dL   GLUCOSE, CAPILLARY     Status: Abnormal   Collection Time   02/09/12  5:25 AM        Component Value Range Comment   Glucose-Capillary 150 (*) 70 - 99 mg/dL    Comment 1 Notify RN     CBC WITH DIFFERENTIAL     Status: Abnormal   Collection Time   02/09/12  6:17 AM      Component Value Range Comment   WBC 8.4  4.0 - 10.5 K/uL    RBC 4.07  3.87 - 5.11 MIL/uL    Hemoglobin 11.7 (*) 12.0 - 15.0 g/dL    HCT 78.2 (*) 95.6 - 46.0 %    MCV 87.0  78.0 - 100.0 fL    MCH 28.7  26.0 - 34.0 pg    MCHC 33.1  30.0 - 36.0 g/dL    RDW 21.3  08.6 - 57.8 %    Platelets 247  150 - 400 K/uL    Neutrophils Relative 54  43 - 77 %    Neutro Abs 4.5  1.7 - 7.7 K/uL    Lymphocytes Relative 30  12 - 46 %    Lymphs Abs 2.5  0.7 - 4.0 K/uL    Monocytes Relative 10  3 - 12 %    Monocytes Absolute 0.8  0.1 - 1.0 K/uL    Eosinophils Relative 5  0 - 5 %    Eosinophils Absolute 0.5  0.0 - 0.7 K/uL    Basophils Relative 1  0 - 1 %    Basophils Absolute 0.1  0.0 - 0.1 K/uL  COMPREHENSIVE METABOLIC PANEL     Status: Abnormal   Collection Time   02/09/12  6:17 AM      Component Value Range Comment   Sodium 137  135 - 145 mEq/L    Potassium 3.9  3.5 - 5.1 mEq/L    Chloride 100  96 - 112 mEq/L    CO2 27  19 - 32 mEq/L    Glucose, Bld 159 (*) 70 - 99 mg/dL    BUN 20  6 - 23 mg/dL    Creatinine, Ser 1.61 (*) 0.50 - 1.10 mg/dL    Calcium 9.1  8.4 - 09.6 mg/dL    Total Protein 6.7  6.0 - 8.3 g/dL    Albumin 2.7 (*) 3.5 - 5.2 g/dL    AST 10  0 - 37 U/L    ALT 9  0 - 35 U/L    Alkaline Phosphatase 63  39 - 117 U/L    Total Bilirubin 0.3  0.3 - 1.2 mg/dL    GFR calc non Af Amer 34 (*) >90 mL/min    GFR calc Af Amer 40 (*) >90 mL/min   GLUCOSE, CAPILLARY     Status: Abnormal   Collection Time   02/09/12  6:44 AM      Component Value Range Comment   Glucose-Capillary 140 (*) 70 - 99 mg/dL   GLUCOSE, CAPILLARY     Status: Abnormal   Collection Time   02/09/12 11:48 AM      Component Value Range Comment   Glucose-Capillary 295 (*) 70 - 99 mg/dL    Comment 1 Notify RN     GLUCOSE, CAPILLARY      Status: Abnormal   Collection Time   02/09/12  4:55 PM      Component Value Range Comment   Glucose-Capillary 130 (*) 70 - 99 mg/dL   GLUCOSE, CAPILLARY     Status: Abnormal   Collection Time   02/09/12  9:01 PM      Component Value Range Comment   Glucose-Capillary 182 (*) 70 - 99 mg/dL   GLUCOSE, CAPILLARY     Status: Abnormal   Collection Time   02/10/12  4:35 AM      Component Value Range Comment   Glucose-Capillary 134 (*) 70 - 99 mg/dL    Comment 1 Notify RN     GLUCOSE, CAPILLARY     Status: Abnormal   Collection Time   02/10/12  7:13 AM      Component Value Range Comment   Glucose-Capillary 155 (*) 70 - 99 mg/dL    Comment 1 Notify RN     GLUCOSE, CAPILLARY     Status: Abnormal   Collection Time   02/10/12 11:30 AM      Component Value Range Comment   Glucose-Capillary 253 (*) 70 - 99 mg/dL    Comment 1 Notify RN     GLUCOSE, CAPILLARY     Status: Abnormal   Collection Time   02/10/12  4:19 PM      Component Value Range Comment   Glucose-Capillary 131 (*) 70 - 99 mg/dL      HEENT: normal Cardio: RRR Resp: CTA B/L GI: BS negative and Distention Extremity:  No Edema Skin:   Intact Neuro: Alert/Oriented, Abnormal Sensory reduced in feet, Normal Motor and Dysarthric Musc/Skel:  Normal Gen NAD Mood/affect appropriate  Assessment/Plan: 1. Functional deficits secondary to deconditioning after dehydration in a pt with chronic gait disorder associated with diabetic peripheral neuropathy and  possible TIA which require 3+ hours per day of interdisciplinary therapy in a comprehensive inpatient rehab setting. Physiatrist is providing close team supervision and 24 hour management of active medical problems listed below. Physiatrist and rehab team continue to assess barriers to discharge/monitor patient progress toward functional and medical goals. FIM: FIM - Bathing Bathing Steps Patient Completed: Chest;Right Arm;Left Arm;Abdomen;Front perineal area;Buttocks;Right upper  leg;Left upper leg;Right lower leg (including foot);Left lower leg (including foot) Bathing: 4: Steadying assist  FIM - Upper Body Dressing/Undressing Upper body dressing/undressing steps patient completed: Thread/unthread right bra strap;Thread/unthread left bra strap;Pull shirt over trunk;Thread/unthread right sleeve of pullover shirt/dresss;Put head through opening of pull over shirt/dress;Thread/unthread left sleeve of pullover shirt/dress Upper body dressing/undressing: 4: Min-Patient completed 75 plus % of tasks (pt needs assist with pulling down sports bra) FIM - Lower Body Dressing/Undressing Lower body dressing/undressing steps patient completed: Thread/unthread right underwear leg;Thread/unthread left underwear leg;Pull underwear up/down;Thread/unthread right pants leg;Thread/unthread left pants leg;Pull pants up/down;Don/Doff right sock;Don/Doff left sock;Don/Doff right shoe;Don/Doff left shoe;Fasten/unfasten left shoe;Fasten/unfasten right shoe Lower body dressing/undressing: 4: Steadying Assist  FIM - Toileting Toileting steps completed by patient: Adjust clothing prior to toileting;Adjust clothing after toileting Toileting Assistive Devices: Grab bar or rail for support Toileting: 3: Mod-Patient completed 2 of 3 steps  FIM - Diplomatic Services operational officer Devices: Psychiatrist Transfers: 4-To toilet/BSC: Min A (steadying Pt. > 75%);4-From toilet/BSC: Min A (steadying Pt. > 75%)  FIM - Bed/Chair Transfer Bed/Chair Transfer Assistive Devices: Bed rails;HOB elevated Bed/Chair Transfer: 4: Chair or W/C > Bed: Min A (steadying Pt. > 75%);4: Bed > Chair or W/C: Min A (steadying Pt. > 75%)  FIM - Locomotion: Wheelchair Locomotion: Wheelchair: 0: Activity did not occur FIM - Locomotion: Ambulation Locomotion: Ambulation Assistive Devices: Designer, industrial/product Ambulation/Gait Assistance: 4: Min guard Locomotion: Ambulation: 2: Travels 50 - 149 ft with minimal  assistance (Pt.>75%)  Comprehension Comprehension Mode: Auditory Comprehension: 5-Understands complex 90% of the time/Cues < 10% of the time  Expression Expression Mode: Verbal Expression: 5-Expresses complex 90% of the time/cues < 10% of the time  Social Interaction Social Interaction: 6-Interacts appropriately with others with medication or extra time (anti-anxiety, antidepressant).  Problem Solving Problem Solving: 5-Solves complex 90% of the time/cues < 10% of the time  Memory Memory: 5-Recognizes or recalls 90% of the time/requires cueing < 10% of the time  Medical Problem List and Plan:  1. DVT Prophylaxis/Anticoagulation: Pharmaceutical: Lovenox  2. Diabetic peripheral neuropathy: Continue neurontin 300 mg bid. May need dose adjusted if renal functions declines off IVF.  3. Mood: Seems motivated and upbeat. Will monitor for now.  4. Neuropsych: This patient is capable of making decisions on his/her own behalf.  5. HTN: monitor with bid checks.  6. DM type 2 with poor control: Monitor with AC/HS CBG checks. Continue lantus with meal coverage.  7. Chronic diastolic CHF: Check daily weights. Renal status much improved and IVF discontinued today. Monitor for signs of overload after and as we Continue to hold lasix. Add salt restrictions.  8. Diabetic foot ulcers: Continue foam dressing. Will add PRAFOs.. Shoes when OOB and pressure relief measures. Wounds are likley affecting balance as well   LOS (Days) 2 A FACE TO FACE EVALUATION WAS PERFORMED  Glorian Mcdonell E 02/10/2012, 4:39 PM

## 2012-02-10 NOTE — Progress Notes (Signed)
Speech Language Pathology Daily Session Note  Patient Details  Name: [name] MRN: [id number] Date of Birth: 1919/07/26  Today's Date: 02/10/2012 Time: 1005-1035 Time Calculation (min): 30 min  Short Term Goals: Week 1: SLP Short Term Goal 1 (Week 1): Patient will consume recommended diet with modified independence for use of safe swallow compensatory strategies.  SLP Short Term Goal 2 (Week 1): Patient will perform base of tongue and pharyngeal strengthening exercises with supervision level verbal cues.  Skilled Therapeutic Interventions: Skilled treatment session focused on addressing dysphagia goals with oropharyngeal strengthening exercises.  Patient verbalized difficulty performing given exercises and requested SLP to re-demonstrate. Following SLP instruction and demonstration; patient was Mod I with exercises at end of session.  Patient also reported coughing episodes and requested SLP explain to her reasoning for that as a result SLP re-educated patient in results of esophagram along with recent increased weakness resulting is dysphagia and need for use of compensatory strategies; patient verbalized understanding.    FIM:  Comprehension Comprehension Mode: Auditory Comprehension: 6-Follows complex conversation/direction: With extra time/assistive device Expression Expression Mode: Verbal Expression: 5-Expresses complex 90% of the time/cues < 10% of the time Social Interaction Social Interaction: 6-Interacts appropriately with others with medication or extra time (anti-anxiety, antidepressant). Problem Solving Problem Solving: 6-Solves complex problems: With extra time Memory Memory: 5-Recognizes or recalls 90% of the time/requires cueing < 10% of the time FIM - Eating Eating Activity: 5: Supervision/cues  Pain Pain Assessment Pain Assessment: No/denies pain  Therapy/Group: Individual Therapy  Charlane Ferretti., CCC-SLP [phone]  Tildon Silveria 02/10/2012, 1:35  PM

## 2012-02-10 NOTE — Progress Notes (Signed)
Occupational Therapy Note - Vestibular Assessment  Patient Details  Name: BESSIE BOYTE MRN: [id number] Date of Birth: [birth year] Today's Date: 02/10/2012  Time In:  10;30  Time Out:  111:25.  Individual session no c/o pain.  Primary team asked for vestibular assessment based on patient's history of "dizziness" and frequent falls.  Per patient, she has a significant history of dizziness for the past 15 years  characterized by "spinning all around" (with the the perception that she is spinning in space).   Patient reports that these episodes occur when she is up and moving about and come on suddenly without warning.  Patient further states these periods of vertigo last up to one day and can result in falls and vomiting.  Patient has a significant history for multiple falls, often resulting in serious injury (i.e. Skull fracture three years ago).  Patient states that she has not experienced a dizzy episode for about a year and that she has not fallen in the past year at home.  Patient denies any episodes with this most recent admission to the hospital.  Was unable to elicit any symptoms of vertigo during testing, therefore I am unable to determine cause of patient's vertigo.  Patient does not exhibit any signs at this time of vestibular dysfunction.  Gait and balance are characterized by very slow gait pace, increased lateral sway, shorter swing phase with RLE, and a description by the patient that her right leg feels heavier.  She also states she can't walk any faster because she feels like she can't coordinate walking any faster.  Patient presents with significant LE neuropathy and impaired sensation bilateral in her feet, left greater than right.  Patient is heavily reliant on vision to compensate for these deficits.  Patient displays posterior bias with eyes closed and narrow base of support.  Discussed with patient potential situations that would place her at higher risk for falling (i.e. Uneven surfaces,  low or absent light, alternating surfaces, situations when her vision might be obstructed or diverted while walking).  At this point do not see any vestibular disturbance. Would address balance issues by challenging patient on uneven surfaces, assessing and addressing postural reactions (ankle, hip, step), endurance, and strengthening of right LE.  Patient has agreed to let her primary therapists know if she experiences any vertigo during this hospitalization at which point I would be happy to reassess if patient is symptomatic.   Norton Pastel 02/10/2012, 2:46 PM

## 2012-02-11 ENCOUNTER — Inpatient Hospital Stay (HOSPITAL_COMMUNITY): Payer: Medicare Other

## 2012-02-11 ENCOUNTER — Encounter (HOSPITAL_BASED_OUTPATIENT_CLINIC_OR_DEPARTMENT_OTHER): Payer: Medicare Other | Attending: General Surgery

## 2012-02-11 ENCOUNTER — Inpatient Hospital Stay (HOSPITAL_COMMUNITY): Payer: Medicare Other | Admitting: *Deleted

## 2012-02-11 LAB — GLUCOSE, CAPILLARY
Glucose-Capillary: 202 mg/dL — ABNORMAL HIGH (ref 70–99)
Glucose-Capillary: 67 mg/dL — ABNORMAL LOW (ref 70–99)
Glucose-Capillary: 98 mg/dL (ref 70–99)
Glucose-Capillary: 236 mg/dL — ABNORMAL HIGH (ref 70–99)

## 2012-02-11 MED ORDER — OXYBUTYNIN CHLORIDE 5 MG PO TABS
5.0000 mg | ORAL_TABLET | Freq: Every day | ORAL | Status: DC
  Administered 2012-02-11 – 2012-02-15 (×5): 5 mg via ORAL
  Filled 2012-02-11 (×6): qty 1

## 2012-02-11 MED ORDER — INSULIN ASPART 100 UNIT/ML ~~LOC~~ SOLN
0.0000 [IU] | Freq: Three times a day (TID) | SUBCUTANEOUS | Status: DC
  Administered 2012-02-12: 1 [IU] via SUBCUTANEOUS
  Administered 2012-02-12: 5 [IU] via SUBCUTANEOUS
  Administered 2012-02-12 – 2012-02-13 (×2): 2 [IU] via SUBCUTANEOUS
  Administered 2012-02-14: 1 [IU] via SUBCUTANEOUS
  Administered 2012-02-14: 3 [IU] via SUBCUTANEOUS
  Administered 2012-02-15: 1 [IU] via SUBCUTANEOUS
  Administered 2012-02-15: 2 [IU] via SUBCUTANEOUS
  Administered 2012-02-16 (×3): 1 [IU] via SUBCUTANEOUS
  Administered 2012-02-17 (×2): 2 [IU] via SUBCUTANEOUS

## 2012-02-11 MED ORDER — INSULIN GLARGINE 100 UNIT/ML ~~LOC~~ SOLN
25.0000 [IU] | Freq: Every day | SUBCUTANEOUS | Status: DC
Start: 2012-02-11 — End: 2012-02-13
  Administered 2012-02-11 – 2012-02-12 (×2): 25 [IU] via SUBCUTANEOUS

## 2012-02-11 MED ORDER — INSULIN ASPART 100 UNIT/ML ~~LOC~~ SOLN
0.0000 [IU] | Freq: Every day | SUBCUTANEOUS | Status: DC
  Administered 2012-02-11: 2 [IU] via SUBCUTANEOUS
  Administered 2012-02-12: 3 [IU] via SUBCUTANEOUS
  Administered 2012-02-13: 4 [IU] via SUBCUTANEOUS
  Administered 2012-02-14 – 2012-02-15 (×2): 2 [IU] via SUBCUTANEOUS

## 2012-02-11 NOTE — Progress Notes (Signed)
Physical Therapy Note  Patient Details  Name: [name] MRN: [id number] Date of Birth: 10-14-1919 Today's Date: 02/11/2012  10:05 - 10:50 45 minutes Individual session Patient denies pain.  Treatment focused on gait, balance, activity tolerance and LE strengthening exercises. Patient ambulated with RW 120 feet x 2 with supervision. Patient ambulated through obstacle course stepping over objects, walking around objects, sidestepping through cones, and stepping up and down off curb with min assist and rolling walker. Patient performed LE standing exercise x 10 reps including marching, hip abduction, squats and sitting LAQ's. Patient returned to room via wheelchair and call bell within reach.   Arelia Longest M 02/11/2012, 12:06 PM

## 2012-02-11 NOTE — Progress Notes (Signed)
OccupationalTherapy Note  Patient Details  Name: [name] MRN: [id number] Date of Birth: 1919/12/31 Today's Date: 02/11/2012  1st session:  Individual treatment; Pain= none;  Time:  [id number]  (60 min).   Engaged in therapeutic bathing and dressing at sink level.  Pt. Stood at sink for minimal assist x 4 druing dressing and bathing.  Pt. Stood for 2-3 miniutes before needing to sit.  Pt. Left in chair with call bell in hand.   2nd session:  Individual treatment; Pain= none; Time:=  1530-1600  (30 min) .  Engaged in functional mobility using RW to do laundry.  Pt. Ambulated to laundry room and performed dynamic balance activities to load washer and remove clothes to place in dryer.  Pt. Ambulated around day room with some right foot drag.  Propelled pt in wc back to room and transferred to recliner with minimal assist.  Left call bell in reach.       Humberto Seals 02/11/2012, 10:07 AM

## 2012-02-11 NOTE — Progress Notes (Signed)
Patient ID: [name], female   DOB: 08-Feb-1919, 79 y.o.   MRN: [id number]  Subjective/Complaints: 79 y.o. female with history of DM, chronic diarrhes, gait disorder with multiple falls, who was admitted 02/03/12 with increasing weakness, speech difficulties with right sided weakness that started the day after Christmas. Patient with history of mild right sided weakness for approximately two months and waxing and waning of dysarthria. She was noted to have acute on chronic renal failure with BUN 38/Cr 1.92 and was started on IVF for hydration and diuretics discontinued. MRI/MRA brain without acute changes. Neurology consulted and MRI C-spine with multifactorial cervical stenosis from C2/C3 to C6/C7 with moderate to severe foraminal stenosis. Speech and strength have improved since admission. MBS done 1/11 due to history of prior dysphagia and patient placed on D3, thin liquids. UCS negative. Patient with history of chronic diarrhea--C-diff check 02/05/11 was negative.  Urinating a lot last noc CBG still elevated in am  Review of Systems  Constitutional: Positive for malaise/fatigue.  Gastrointestinal: Positive for diarrhea.       Chronic diarrhea for 70 years  All other systems reviewed and are negative.    Objective: Vital Signs: Blood pressure 182/74, pulse 61, temperature 98.1 F (36.7 C), temperature source Oral, resp. rate 18, height 5\' 8"  (1.727 m), weight 80.6 kg (177 lb 11.1 oz), SpO2 93.00%. No results found. Results for orders placed during the hospital encounter of 02/08/12 (from the past 72 hour(s))  GLUCOSE, CAPILLARY     Status: Abnormal   Collection Time   02/08/12  4:58 PM      Component Value Range Comment   Glucose-Capillary 147 (*) 70 - 99 mg/dL   GLUCOSE, CAPILLARY     Status: Abnormal   Collection Time   02/08/12  9:12 PM      Component Value Range Comment   Glucose-Capillary 134 (*) 70 - 99 mg/dL   GLUCOSE, CAPILLARY     Status: Abnormal   Collection Time   02/09/12  5:25 AM      Component Value Range Comment   Glucose-Capillary 150 (*) 70 - 99 mg/dL    Comment 1 Notify RN     CBC WITH DIFFERENTIAL     Status: Abnormal   Collection Time   02/09/12  6:17 AM      Component Value Range Comment   WBC 8.4  4.0 - 10.5 K/uL    RBC 4.07  3.87 - 5.11 MIL/uL    Hemoglobin 11.7 (*) 12.0 - 15.0 g/dL    HCT 14.7 (*) 82.9 - 46.0 %    MCV 87.0  78.0 - 100.0 fL    MCH 28.7  26.0 - 34.0 pg    MCHC 33.1  30.0 - 36.0 g/dL    RDW 56.2  13.0 - 86.5 %    Platelets 247  150 - 400 K/uL    Neutrophils Relative 54  43 - 77 %    Neutro Abs 4.5  1.7 - 7.7 K/uL    Lymphocytes Relative 30  12 - 46 %    Lymphs Abs 2.5  0.7 - 4.0 K/uL    Monocytes Relative 10  3 - 12 %    Monocytes Absolute 0.8  0.1 - 1.0 K/uL    Eosinophils Relative 5  0 - 5 %    Eosinophils Absolute 0.5  0.0 - 0.7 K/uL    Basophils Relative 1  0 - 1 %    Basophils Absolute 0.1  0.0 - 0.1  K/uL   COMPREHENSIVE METABOLIC PANEL     Status: Abnormal   Collection Time   02/09/12  6:17 AM      Component Value Range Comment   Sodium 137  135 - 145 mEq/L    Potassium 3.9  3.5 - 5.1 mEq/L    Chloride 100  96 - 112 mEq/L    CO2 27  19 - 32 mEq/L    Glucose, Bld 159 (*) 70 - 99 mg/dL    BUN 20  6 - 23 mg/dL    Creatinine, Ser 4.09 (*) 0.50 - 1.10 mg/dL    Calcium 9.1  8.4 - 81.1 mg/dL    Total Protein 6.7  6.0 - 8.3 g/dL    Albumin 2.7 (*) 3.5 - 5.2 g/dL    AST 10  0 - 37 U/L    ALT 9  0 - 35 U/L    Alkaline Phosphatase 63  39 - 117 U/L    Total Bilirubin 0.3  0.3 - 1.2 mg/dL    GFR calc non Af Amer 34 (*) >90 mL/min    GFR calc Af Amer 40 (*) >90 mL/min   GLUCOSE, CAPILLARY     Status: Abnormal   Collection Time   02/09/12  6:44 AM      Component Value Range Comment   Glucose-Capillary 140 (*) 70 - 99 mg/dL   GLUCOSE, CAPILLARY     Status: Abnormal   Collection Time   02/09/12 11:48 AM      Component Value Range Comment   Glucose-Capillary 295 (*) 70 - 99 mg/dL    Comment 1 Notify RN       GLUCOSE, CAPILLARY     Status: Abnormal   Collection Time   02/09/12  4:55 PM      Component Value Range Comment   Glucose-Capillary 130 (*) 70 - 99 mg/dL   GLUCOSE, CAPILLARY     Status: Abnormal   Collection Time   02/09/12  9:01 PM      Component Value Range Comment   Glucose-Capillary 182 (*) 70 - 99 mg/dL   GLUCOSE, CAPILLARY     Status: Abnormal   Collection Time   02/10/12  4:35 AM      Component Value Range Comment   Glucose-Capillary 134 (*) 70 - 99 mg/dL    Comment 1 Notify RN     GLUCOSE, CAPILLARY     Status: Abnormal   Collection Time   02/10/12  7:13 AM      Component Value Range Comment   Glucose-Capillary 155 (*) 70 - 99 mg/dL    Comment 1 Notify RN     GLUCOSE, CAPILLARY     Status: Abnormal   Collection Time   02/10/12 11:30 AM      Component Value Range Comment   Glucose-Capillary 253 (*) 70 - 99 mg/dL    Comment 1 Notify RN     GLUCOSE, CAPILLARY     Status: Abnormal   Collection Time   02/10/12  4:19 PM      Component Value Range Comment   Glucose-Capillary 131 (*) 70 - 99 mg/dL   GLUCOSE, CAPILLARY     Status: Abnormal   Collection Time   02/10/12  8:46 PM      Component Value Range Comment   Glucose-Capillary 155 (*) 70 - 99 mg/dL   GLUCOSE, CAPILLARY     Status: Abnormal   Collection Time   02/11/12  3:58 AM  Component Value Range Comment   Glucose-Capillary 180 (*) 70 - 99 mg/dL    Comment 1 Notify RN     GLUCOSE, CAPILLARY     Status: Abnormal   Collection Time   02/11/12  6:40 AM      Component Value Range Comment   Glucose-Capillary 176 (*) 70 - 99 mg/dL    Comment 1 Notify RN     GLUCOSE, CAPILLARY     Status: Abnormal   Collection Time   02/11/12  7:04 AM      Component Value Range Comment   Glucose-Capillary 162 (*) 70 - 99 mg/dL    Comment 1 Notify RN        HEENT: normal Cardio: RRR Resp: CTA B/L GI: BS negative and Distention Extremity:  No Edema Skin:   Intact Neuro: Alert/Oriented, Abnormal Sensory reduced in feet, Normal  Motor and Dysarthric Musc/Skel:  Normal Gen NAD Mood/affect appropriate  Assessment/Plan: 1. Functional deficits secondary to deconditioning after dehydration in a pt with chronic gait disorder associated with diabetic peripheral neuropathy and possible TIA which require 3+ hours per day of interdisciplinary therapy in a comprehensive inpatient rehab setting. Physiatrist is providing close team supervision and 24 hour management of active medical problems listed below. Physiatrist and rehab team continue to assess barriers to discharge/monitor patient progress toward functional and medical goals. FIM: FIM - Bathing Bathing Steps Patient Completed: Chest;Right Arm;Left Arm;Abdomen;Front perineal area;Buttocks;Right upper leg;Left upper leg;Right lower leg (including foot);Left lower leg (including foot) Bathing: 4: Steadying assist  FIM - Upper Body Dressing/Undressing Upper body dressing/undressing steps patient completed: Thread/unthread right bra strap;Thread/unthread left bra strap;Pull shirt over trunk;Thread/unthread right sleeve of pullover shirt/dresss;Put head through opening of pull over shirt/dress;Thread/unthread left sleeve of pullover shirt/dress Upper body dressing/undressing: 4: Min-Patient completed 75 plus % of tasks (pt needs assist with pulling down sports bra) FIM - Lower Body Dressing/Undressing Lower body dressing/undressing steps patient completed: Thread/unthread right underwear leg;Thread/unthread left underwear leg;Pull underwear up/down;Thread/unthread right pants leg;Thread/unthread left pants leg;Pull pants up/down;Don/Doff right sock;Don/Doff left sock;Don/Doff right shoe;Don/Doff left shoe;Fasten/unfasten left shoe;Fasten/unfasten right shoe Lower body dressing/undressing: 4: Steadying Assist  FIM - Toileting Toileting steps completed by patient: Adjust clothing prior to toileting;Performs perineal hygiene;Adjust clothing after toileting Toileting Assistive  Devices: Grab bar or rail for support Toileting: 4: Steadying assist  FIM - Diplomatic Services operational officer Devices: Bedside commode Toilet Transfers: 4-To toilet/BSC: Min A (steadying Pt. > 75%);4-From toilet/BSC: Min A (steadying Pt. > 75%)  FIM - Bed/Chair Transfer Bed/Chair Transfer Assistive Devices: Bed rails;HOB elevated Bed/Chair Transfer: 4: Chair or W/C > Bed: Min A (steadying Pt. > 75%);4: Bed > Chair or W/C: Min A (steadying Pt. > 75%)  FIM - Locomotion: Wheelchair Locomotion: Wheelchair: 0: Activity did not occur FIM - Locomotion: Ambulation Locomotion: Ambulation Assistive Devices: Designer, industrial/product Ambulation/Gait Assistance: 4: Min guard Locomotion: Ambulation: 2: Travels 50 - 149 ft with minimal assistance (Pt.>75%)  Comprehension Comprehension Mode: Auditory Comprehension: 5-Understands complex 90% of the time/Cues < 10% of the time  Expression Expression Mode: Verbal Expression: 5-Expresses complex 90% of the time/cues < 10% of the time  Social Interaction Social Interaction: 6-Interacts appropriately with others with medication or extra time (anti-anxiety, antidepressant).  Problem Solving Problem Solving: 5-Solves complex 90% of the time/cues < 10% of the time  Memory Memory: 5-Recognizes or recalls 90% of the time/requires cueing < 10% of the time  Medical Problem List and Plan:  1. DVT Prophylaxis/Anticoagulation: Pharmaceutical: Lovenox  2.  Diabetic peripheral neuropathy: Continue neurontin 300 mg bid. May need dose adjusted if renal functions declines off IVF.  3. Mood: Seems motivated and upbeat. Will monitor for now.  4. Neuropsych: This patient is capable of making decisions on his/her own behalf.  5. HTN: monitor with bid checks.  6. DM type 2 with poor control: Monitor with AC/HS CBG checks. adjust lantus with meal coverage.  7. Chronic diastolic CHF: Check daily weights. Renal status much improved and IVF discontinued today. Monitor  for signs of overload after and as we Continue to hold lasix. Add salt restrictions.  8. Diabetic foot ulcers: Continue foam dressing. Will add PRAFOs.. Shoes when OOB and pressure relief measures. Wounds are likley affecting balance as well 9.  Spastic bladder trial oxybutnin at noc, monitor for retention  LOS (Days) 3 A FACE TO FACE EVALUATION WAS PERFORMED  KIRSTEINS,ANDREW E 02/11/2012, 9:17 AM

## 2012-02-11 NOTE — Significant Event (Signed)
Hypoglycemic Event  CBG: 67   Treatment: 15 GM carbohydrate snack  Symptoms: None  Follow-up CBG: Time:1648 CBG Result:98  Possible Reasons for Event: Medication regimen: Sliding scale and meal coverate  Comments/MD notified:Pam Love, PA notified, new orders received    [name]  Remember to initiate Hypoglycemia Order Set & complete

## 2012-02-11 NOTE — Progress Notes (Signed)
Physical Therapy Note  Patient Details  Name: [name] MRN: [id number] Date of Birth: Mar 18, 1919 Today's Date: 02/11/2012  1:05 - 2:00 55 minutes Individual session Patient denies pain.  Treatment focused on increasing independence with functional mobility and activity tolerance. Patient performed car transfer with supervision and verbal cues. Patient ambulated up and down 6 steps with bilateral rails and supervision. Patient practiced ambulating and transferring to different areas of the apartment: bed, rocker, and kitchen chair. Patient ambulated 150 feet on level tile in controlled environment with rolling walker and supervision. Patient reported having to go to bathroom. Patient was incontinent of stool. Patient managed clothing prior to sitting. Patient required assistance with hygiene and adjusting clothes after toileting. Patient worked on standing at over bed table and folding towels and washcloths to work on standing balance without UE support. Patient left in recliner with call bell in reach.   Arelia Longest M 02/11/2012, 2:54 PM

## 2012-02-12 ENCOUNTER — Inpatient Hospital Stay (HOSPITAL_COMMUNITY): Payer: Medicare Other

## 2012-02-12 DIAGNOSIS — R269 Unspecified abnormalities of gait and mobility: Secondary | ICD-10-CM

## 2012-02-12 DIAGNOSIS — R5381 Other malaise: Secondary | ICD-10-CM

## 2012-02-12 DIAGNOSIS — M47812 Spondylosis without myelopathy or radiculopathy, cervical region: Secondary | ICD-10-CM

## 2012-02-12 DIAGNOSIS — I6330 Cerebral infarction due to thrombosis of unspecified cerebral artery: Secondary | ICD-10-CM

## 2012-02-12 LAB — GLUCOSE, CAPILLARY
Glucose-Capillary: 273 mg/dL — ABNORMAL HIGH (ref 70–99)
Glucose-Capillary: 198 mg/dL — ABNORMAL HIGH (ref 70–99)

## 2012-02-12 NOTE — Progress Notes (Signed)
Physical Therapy Session Note  Patient Details  Name: SHEKIA KUPER MRN: [id number] Date of Birth: 1919-11-08  Today's Date: 02/12/2012 Time: 1015-1100 Time Calculation (min): 45 min  Skilled Therapeutic Interventions/Progress Updates:   Close S in pt room with gait to/from bathroom and toileting needs using RW, cues for safety with RW when navigating obstacles. Focused on dynamic balance activities and ankle strategies while standing on compliant surface and reaching across midline and upwards across body to move horseshoes to basketball hoop or across body on parallel bars without UE support with close S/intermittent steady A. Dynamic gait through obstacle course to simulate home environment with close S; cues needed to stay inside RW during turns and cues for safety with lifting RW when stepping over threshold (not to take steps at the same time). Stair training for home entry with close S. Excellent participation; transferred with S using RW back to recliner at end of session.   Therapy Documentation Precautions:  Precautions Precautions: Fall Precaution Comments: hx multiple falls Restrictions Weight Bearing Restrictions: No   Pain: Denies pain.   Locomotion : Ambulation Ambulation/Gait Assistance: 5: Supervision   See FIM for current functional status  Therapy/Group: Individual Therapy  Karolee Stamps [facility] 02/12/2012, 11:21 AM

## 2012-02-12 NOTE — Progress Notes (Signed)
Patient ID: [name], female   DOB: Feb 04, 1919, 79 y.o.   MRN: [id number]  Subjective/Complaints: 79 y.o. female with history of DM, chronic diarrhes, gait disorder with multiple falls, who was admitted 02/03/12 with increasing weakness, speech difficulties with right sided weakness that started the day after Christmas. Patient with history of mild right sided weakness for approximately two months and waxing and waning of dysarthria. She was noted to have acute on chronic renal failure with BUN 38/Cr 1.92 and was started on IVF for hydration and diuretics discontinued. MRI/MRA brain without acute changes. Neurology consulted and MRI C-spine with multifactorial cervical stenosis from C2/C3 to C6/C7 with moderate to severe foraminal stenosis. Speech and strength have improved since admission. MBS done 1/11 due to history of prior dysphagia and patient placed on D3, thin liquids. UCS negative. Patient with history of chronic diarrhea--C-diff check 02/05/11 was negative.  Patient denies any specific complaints. She denies any pain. Her appetite is good. She states she slept well.  Review of Systems  Constitutional: Positive for malaise/fatigue.  Gastrointestinal: Positive for diarrhea.       Chronic diarrhea for 70 years  All other systems reviewed and are negative.    Objective: Vital Signs: Blood pressure 132/77, pulse 59, temperature 98.5 F (36.9 C), temperature source Oral, resp. rate 17, height 5\' 8"  (1.727 m), weight 177 lb 7.5 oz (80.5 kg), SpO2 93.00%.  Pleasant female in no acute distress. She is sitting up eating breakfast. Neck is supple. Chest is clear to auscultation without increased work of breathing. Cardiac exam S1-S2 are regular. Abdominal exam active bowel sounds, soft, nontender. Extremities trace edema at the ankles. Neurologic exam she is alert and oriented.  CBG (last 3)   Basename 02/12/12 0741 02/11/12 2053 02/11/12 1648  GLUCAP 125* 236* 98       Assessment/Plan: 1. Functional deficits secondary to deconditioning after dehydration in a pt with chronic gait disorder associated with diabetic peripheral neuropathy and possible TIA  Medical Problem List and Plan:  1. DVT Prophylaxis/Anticoagulation: Pharmaceutical: Lovenox  2. Diabetic peripheral neuropathy: Continue neurontin 300 mg bid. May need dose adjusted if renal functions declines   Lab Results  Component Value Date   CREATININE 1.32* 02/09/2012   3. Mood: Seems motivated and upbeat. Will monitor for now.  4. Neuropsych: This patient is capable of making decisions on his/her own behalf.  5. HTN: monitor with bid checks.  6. DM type 2 with poor control: Monitor with AC/HS CBG checks. adjust lantus with meal coverage.  7. Chronic diastolic CHF: Check daily weights. Renal status much improved and IVF discontinued today. Monitor for signs of overload after and as we Continue to hold lasix. Add salt restrictions.  8. Diabetic foot ulcers: Continue foam dressing. Blood sugars are variable but I will not changed medications at this time. 9.  Spastic bladder trial oxybutnin at noc, monitor for retention  LOS (Days) 4 A FACE TO FACE EVALUATION WAS PERFORMED  SWORDS,BRUCE HENRY 02/12/2012, 9:28 AM

## 2012-02-13 ENCOUNTER — Encounter (HOSPITAL_COMMUNITY): Payer: Medicare Other | Admitting: Occupational Therapy

## 2012-02-13 ENCOUNTER — Inpatient Hospital Stay (HOSPITAL_COMMUNITY): Payer: Medicare Other | Admitting: Occupational Therapy

## 2012-02-13 ENCOUNTER — Inpatient Hospital Stay (HOSPITAL_COMMUNITY): Payer: Medicare Other | Admitting: *Deleted

## 2012-02-13 LAB — GLUCOSE, CAPILLARY
Glucose-Capillary: 101 mg/dL — ABNORMAL HIGH (ref 70–99)
Glucose-Capillary: 75 mg/dL (ref 70–99)
Glucose-Capillary: 324 mg/dL — ABNORMAL HIGH (ref 70–99)

## 2012-02-13 MED ORDER — INSULIN ASPART 100 UNIT/ML ~~LOC~~ SOLN
6.0000 [IU] | Freq: Three times a day (TID) | SUBCUTANEOUS | Status: DC
  Administered 2012-02-13 – 2012-02-16 (×7): 6 [IU] via SUBCUTANEOUS

## 2012-02-13 MED ORDER — INSULIN GLARGINE 100 UNIT/ML ~~LOC~~ SOLN
28.0000 [IU] | Freq: Every day | SUBCUTANEOUS | Status: DC
  Administered 2012-02-13 – 2012-02-16 (×4): 28 [IU] via SUBCUTANEOUS

## 2012-02-13 NOTE — Progress Notes (Signed)
Occupational Therapy Session Note  Patient Details  Name: [name] MRN: [id number] Date of Birth: [birth year]  Today's Date: 02/13/2012 Time:  - 4098-119 and 1300-1400 Total minutes=125    Skilled Therapeutic Interventions/Progress Updates:  AM session:  ADL in w/c at sink with focus on dynamic and static balance and LB dressing to increase safety and independence with ADL.  Initially patient stated, "someone else helps me with the pants," but she initiated and was able to complete the task with supervision.  PM session:  Functional mobility in kitchen heating soup in microwave.  Patient with 2 losses of balance standing when she leaned diagonally forward left and forward right to work on counter to open soup container and move items on counter.  She stood on tip of her right foot and with feet close together but was not aware or attentive of the posture until this clinician requested she look to see the position and placement of her right foot.   Patient did not attempt to right herself during either loss of balance.       Patient would benefit from more opportunities to practice technique and planning for dynamic balance - especially while completing tasks with her hands.     Therapy Documentation Precautions:  Precautions Precautions: Fall Precaution Comments: hx multiple falls Restrictions Weight Bearing Restrictions: No  Pain: 0 See FIM for current functional status  Therapy/Group: Individual Therapy  [facility] 02/13/2012, 8:47 AM

## 2012-02-13 NOTE — Progress Notes (Signed)
Physical Therapy Session Note  Patient Details  Name: [name] MRN: [id number] Date of Birth: 1919/08/16  Today's Date: 02/13/2012 Time: 4098-1191 Time Calculation (min): 55 min  Skilled Therapeutic Interventions/Progress Updates:    Patient received sitting in recliner. Today's session focused on increasing activity tolerance through gait training, stair negotiation, balance, and NuStep exercise. BERG Balance Test performed, see details below. Patient exercised on NuStep Lvl 3 x12 minutes with B UE and B LE to promote increased strength and cardiovascular endurance. Patient instructed in wheelchair mobility x150' with supervision to facilitate increased strengthening and coordination of B LEs.  Patient returned to room and left seated in recliner with all needs within reach.  Therapy Documentation Precautions:  Precautions Precautions: Fall Precaution Comments: hx multiple falls Restrictions Weight Bearing Restrictions: No Pain: Pain Assessment Pain Assessment: No/denies pain Pain Score: 0-No pain Locomotion : Ambulation Ambulation: Yes Ambulation/Gait Assistance: 5: Supervision Ambulation Distance (Feet): 125 Feet x2 Assistive device: Rolling walker Ambulation/Gait Assistance Details: Verbal cues for gait pattern;Tactile cues for posture;Verbal cues for precautions/safety Ambulation/Gait Assistance Details: verbal cues for maintaining BOS within RW when turning Gait Gait: Yes Gait Pattern: Trunk flexed;Narrow base of support;Step-through pattern;Decreased stride length;Scissoring (Intermittent scissoring noted) Stairs / Additional Locomotion Stairs: Yes Stairs Assistance: 5: Supervision Stairs Assistance Details: Verbal cues for precautions/safety Stair Management Technique: Two rails;Alternating pattern;Forwards Number of Stairs: 5  Height of Stairs: 6  Corporate treasurer: Yes Wheelchair Mobility Assistance: 5: Nurse, children's Details: Verbal cues for precautions/safety; Verbal cues for sequencing; Verbal cues for technique Distance: 150' Balance: Balance Balance Assessed: Yes Standardized Balance Assessment Standardized Balance Assessment: Berg Balance Test Berg Balance Test Sit to Stand: Able to stand  independently using hands Standing Unsupported: Able to stand 2 minutes with supervision Sitting with Back Unsupported but Feet Supported on Floor or Stool: Able to sit safely and securely 2 minutes Stand to Sit: Controls descent by using hands Transfers: Able to transfer safely, definite need of hands Standing Unsupported with Eyes Closed: Able to stand 10 seconds with supervision Standing Ubsupported with Feet Together: Able to place feet together independently but unable to hold for 30 seconds From Standing, Reach Forward with Outstretched Arm: Can reach forward >12 cm safely (5") From Standing Position, Pick up Object from Floor: Unable to pick up shoe, but reaches 2-5 cm (1-2") from shoe and balances independently From Standing Position, Turn to Look Behind Over each Shoulder: Needs assist to keep from losing balance and falling Turn 360 Degrees: Needs assistance while turning Standing Unsupported, Alternately Place Feet on Step/Stool: Needs assistance to keep from falling or unable to try Standing Unsupported, One Foot in Front: Loses balance while stepping or standing Standing on One Leg: Unable to try or needs assist to prevent fall Total Score: 26/56, indicating patient is at a high risk for falls (almost 100%).  See FIM for current functional status  Therapy/Group: Individual Therapy  Chipper Herb. Dajia Gunnels, PT, DPT  02/13/2012, 3:39 PM

## 2012-02-13 NOTE — Progress Notes (Signed)
Patient ID: [name], female   DOB: 07/15/1919, 79 y.o.   MRN: [id number]  Subjective/Complaints: 79 y.o. female with history of DM, chronic diarrhes, gait disorder with multiple falls, who was admitted 02/03/12 with increasing weakness, speech difficulties with right sided weakness that started the day after Christmas. Patient with history of mild right sided weakness for approximately two months and waxing and waning of dysarthria. She was noted to have acute on chronic renal failure with BUN 38/Cr 1.92 and was started on IVF for hydration and diuretics discontinued. MRI/MRA brain without acute changes. Neurology consulted and MRI C-spine with multifactorial cervical stenosis from C2/C3 to C6/C7 with moderate to severe foraminal stenosis. Speech and strength have improved since admission. MBS done 1/11 due to history of prior dysphagia and patient placed on D3, thin liquids. UCS negative. Patient with history of chronic diarrhea--C-diff check 02/05/11 was negative.  No c/o- she denies pain. Slept well good appetite.   Review of Systems  Constitutional: Positive for malaise/fatigue.  Gastrointestinal:       Chronic diarrhea for 70 years    Objective: Vital Signs: Blood pressure 153/51, pulse 69, temperature 98.2 F (36.8 C), temperature source Oral, resp. rate 17, height 5\' 8"  (1.727 m), weight 176 lb 2.4 oz (79.9 kg), SpO2 95.00%.  Pleasant female in no acute distress. Resting in be. . Chest is clear to auscultation without increased work of breathing. Cardiac exam S1-S2 are regular. Abdominal exam active bowel sounds, soft, nontender. Extremities trace edema at the ankles. Neurologic exam she is alert and oriented.  CBG (last 3)   Basename 02/12/12 2000 02/12/12 1639 02/12/12 1145  GLUCAP 278* 198* 273*      Assessment/Plan: 1. Functional deficits secondary to deconditioning after dehydration in a pt with chronic gait disorder associated with diabetic peripheral neuropathy and  possible TIA  Medical Problem List and Plan:  1. DVT Prophylaxis/Anticoagulation: Pharmaceutical: Lovenox  2. Diabetic peripheral neuropathy: Continue neurontin 300 mg bid. May need dose adjusted if renal functions declines   Lab Results  Component Value Date   CREATININE 1.32* 02/09/2012   3. Mood: Seems motivated and upbeat. Will monitor for now.  4. Neuropsych: This patient is capable of making decisions on his/her own behalf.  5. HTN: monitor with bid checks.  6. DM type 2 with poor control: Monitor with AC/HS CBG checks. Will increase lantus and review SSI with meals  7. Chronic diastolic CHF: clinically doing ok 8. Diabetic foot ulcers: Continue foam dressing.  9.  Spastic bladder trial oxybutnin at noc, monitor for retention  LOS (Days) 5 A FACE TO FACE EVALUATION WAS PERFORMED  SWORDS,BRUCE HENRY 02/13/2012, 7:06 AM

## 2012-02-14 ENCOUNTER — Inpatient Hospital Stay (HOSPITAL_COMMUNITY): Payer: Medicare Other | Admitting: Occupational Therapy

## 2012-02-14 ENCOUNTER — Inpatient Hospital Stay (HOSPITAL_COMMUNITY): Payer: Medicare Other

## 2012-02-14 ENCOUNTER — Inpatient Hospital Stay (HOSPITAL_COMMUNITY): Payer: Medicare Other | Admitting: Speech Pathology

## 2012-02-14 LAB — GLUCOSE, CAPILLARY: Glucose-Capillary: 69 mg/dL — ABNORMAL LOW (ref 70–99)

## 2012-02-14 NOTE — Progress Notes (Signed)
Occupational Therapy Session Note  Patient Details  Name: [name] MRN: [id number] Date of Birth: 09-25-1919  Today's Date: 02/14/2012 Time: 9562-1308 Time Calculation (min): 45 min  Short Term Goals: Week 1:  OT Short Term Goal 1 (Week 1): Pt will perform all bathing with supervision in the shower sit to stand with use of seat. OT Short Term Goal 2 (Week 1): Pt will perform all dressing sit to stand for 2 consecutive sessions with supervision.   OT Short Term Goal 3 (Week 1): Pt will perform all aspects of toileting including transfer with supervision including transfer. OT Short Term Goal 4 (Week 1): Pt will perform walk-in shower transfer with RW and shower seat with supervision. OT Short Term Goal 5 (Week 1): Pt will perform FM coordination exercises with independence.  Skilled Therapeutic Interventions/Progress Updates:    Began working on toilet transfers and toileting using the RW.  Pt overall min assist for all aspects.  Progressed down to the OT gym and worked on standing balance and endurance while performing reaching tasks.  Pt able to stand for 12 mins without needing a rest break.  In standing still exhibits kyphotic posture thus limiting the RUE functional reaching.    Therapy Documentation Precautions:  Precautions Precautions: Fall Precaution Comments: hx multiple falls Restrictions Weight Bearing Restrictions: No  Vital Signs: Therapy Vitals Temp: 98 F (36.7 C) Temp src: Oral Pulse Rate: 65  Resp: 18  BP: 107/66 mmHg Patient Position, if appropriate: Sitting Oxygen Therapy SpO2: 92 % O2 Device: None (Room air)  ADL: See FIM for current functional status  Therapy/Group: Individual Therapy  Shalon Councilman OTR/L 02/14/2012, 3:42 PM

## 2012-02-14 NOTE — Progress Notes (Signed)
Occupational Therapy Session Note  Patient Details  Name: [name] MRN: [id number] Date of Birth: 11/08/1919  Today's Date: 02/14/2012 Time: 0906-1000 Time Calculation (min): 54 min  Short Term Goals: Week 1:  OT Short Term Goal 1 (Week 1): Pt will perform all bathing with supervision in the shower sit to stand with use of seat. OT Short Term Goal 2 (Week 1): Pt will perform all dressing sit to stand for 2 consecutive sessions with supervision.   OT Short Term Goal 3 (Week 1): Pt will perform all aspects of toileting including transfer with supervision including transfer. OT Short Term Goal 4 (Week 1): Pt will perform walk-in shower transfer with RW and shower seat with supervision. OT Short Term Goal 5 (Week 1): Pt will perform FM coordination exercises with independence.  Skilled Therapeutic Interventions/Progress Updates:    Pt performed shower using the shower seat during session.  Needs min assist for use of RW to gather clothing and perform transfer to the shower.  When attempting to remove her gown in standing pt exhibited a posterior LOB, needing min assist to correct.  Performed all bathing and dressing sit to stand with min assist for sit to stand and to maintain dynamic standing balance when UEs are used for puling up pants or washing peri area.  Still needing mod instructional cueing for stand to sit transition, does not sit in a controlled manner using her UEs.  Therapy Documentation Precautions:  Precautions Precautions: Fall Precaution Comments: hx multiple falls Restrictions Weight Bearing Restrictions: No  Pain: Pain Assessment Pain Assessment: No/denies pain ADL: See FIM for current functional status  Therapy/Group: Individual Therapy  Tazaria Dlugosz OTR/L 02/14/2012, 10:00 AM

## 2012-02-14 NOTE — Progress Notes (Signed)
Speech Language Pathology Daily Session Note & Discharge Summary  Patient Details  Name: [name] MRN: [id number] Date of Birth: 22-Apr-1919  Today's Date: 02/14/2012 Time: 7829-5621 Time Calculation (min): 30 min  Short Term Goals: Week 1: SLP Short Term Goal 1 (Week 1): Patient will consume recommended diet with modified independence for use of safe swallow compensatory strategies.  SLP Short Term Goal 2 (Week 1): Patient will perform base of tongue and pharyngeal strengthening exercises with supervision level verbal cues.  Skilled Therapeutic Interventions: Skilled treatment session focused on addressing dysphagia goals and completing family education.  SLP facilitated session by observing trials of thin liquids with no overt s/s of aspiration and recalled other safe swallow compensatory strategies with increased wait time or use of external aids.  Daughter verbalized that her swallowing and voice appeared to be close to baseline.  SLP also educated daughter regarding compensatory strategies to use to compensate for esophageal issues.  Daughter verbalized understanding.     FIM:  Comprehension Comprehension Mode: Auditory Comprehension: 5-Follows basic conversation/direction: With no assist Expression Expression Mode: Verbal Expression: 5-Expresses complex 90% of the time/cues < 10% of the time Social Interaction Social Interaction: 5-Interacts appropriately 90% of the time - Needs monitoring or encouragement for participation or interaction. Problem Solving Problem Solving: 5-Solves complex 90% of the time/cues < 10% of the time Memory Memory: 5-Recognizes or recalls 90% of the time/requires cueing < 10% of the time FIM - Eating Eating Activity: 6: Swallowing techniques: self-managed;6: More than reasonable amount of time  Pain Pain Assessment Pain Assessment: No/denies pain  Therapy/Group: Individual Therapy   Speech Language Pathology Discharge Summary  Patient  Details  Name: [name] MRN: [id number] Date of Birth: 06-01-1919  Today's Date: 02/14/2012  Patient has met 2 of 2 long term goals.  Patient to discharge at overall Modified Independent;Supervision level.  Reasons goals not met: n/a   Clinical Impression/Discharge Summary: Patient met 2 out of 2 long term goals during CIR stay due to carryover of recommended safe swallow compensations/strategies with increased wait time. Patient also performing pharyngeal strengthening exercises with supervision level verbal cues that patient can continue at home with supervision assist from family/caretaker.  As a result, goals met and no further skilled SLP services are warranted at this time.   Care Partner:  Caregiver Able to Provide Assistance: Yes    Recommendation:  None     Equipment: none   Reasons for discharge: Treatment goals met   Patient/Family Agrees with Progress Made and Goals Achieved: Yes   See FIM for current functional status  Charlane Ferretti., CCC-SLP [phone]  Lamiah Marmol 02/14/2012, 4:17 PM

## 2012-02-14 NOTE — Progress Notes (Addendum)
Physical Therapy Session Note  Patient Details  Name: [name] MRN: [id number] Date of Birth: [birth year]  Today's Date: 02/14/2012 Time: 0805-0900 Time Calculation (min): 55 min  Short Term Goals: Week 1: = LTGs    Skilled Therapeutic Interventions/Progress Updates:   neuromuscular re-education via tactile and VCs, manual cues, forced use :  -repetitive sit>< stand focusing on forward wt shift, without use of UEs -trunk shortening /lengthening in sitting with wt shifting and retrieval of items with L or R hand, for trunk activation --midline orientation; pt has slow and inadequate head righting, trunk righting, and typically sits in 15-20 trunk extension, with list to L  Discussed with MD pt's tendency to lean backwards in sitting and standing.  Pt stated she has been checked for Parkinson's by a previous MD, but was negative.  Pt had a sister who had it.  Pt had bowel urgency; returned to room.  Toilet transfer with min assist and wall bar.  Pt had large soft stool in diaper.  Pt to use pull cord when finished.  RN informed.     Therapy Documentation Precautions:  Precautions Precautions: Fall Precaution Comments: hx multiple falls Restrictions Weight Bearing Restrictions: No  Pain: Pain Assessment Pain Assessment: No/denies pain     See FIM for current functional status  Therapy/Group: Individual Therapy  [name] 02/14/2012, 9:05 AM

## 2012-02-14 NOTE — Progress Notes (Signed)
Patient ID: [name], female   DOB: 01-26-1919, 79 y.o.   MRN: [id number]  Subjective/Complaints: 79 y.o. female with history of DM, chronic diarrhes, gait disorder with multiple falls, who was admitted 02/03/12 with increasing weakness, speech difficulties with right sided weakness that started the day after Christmas. Patient with history of mild right sided weakness for approximately two months and waxing and waning of dysarthria. She was noted to have acute on chronic renal failure with BUN 38/Cr 1.92 and was started on IVF for hydration and diuretics discontinued. MRI/MRA brain without acute changes. Neurology consulted and MRI C-spine with multifactorial cervical stenosis from C2/C3 to C6/C7 with moderate to severe foraminal stenosis. Speech and strength have improved since admission. MBS done 1/11 due to history of prior dysphagia and patient placed on D3, thin liquids. UCS negative. Patient with history of chronic diarrhea--C-diff check 02/05/11 was negative.  PT notes pt with Berg of 26 indicating 100% fall risk .  Strong posterior lean Pt's sister had PD but a MD told her she didn't have it  Review of Systems  Constitutional: Positive for malaise/fatigue.  Gastrointestinal: Positive for diarrhea.       Chronic diarrhea for 70 years  All other systems reviewed and are negative.    Objective: Vital Signs: Blood pressure 126/69, pulse 59, temperature 98.4 F (36.9 C), temperature source Oral, resp. rate 17, height 5\' 8"  (1.727 m), weight 76.3 kg (168 lb 3.4 oz), SpO2 93.00%. No results found. Results for orders placed during the hospital encounter of 02/08/12 (from the past 72 hour(s))  GLUCOSE, CAPILLARY     Status: Abnormal   Collection Time   02/11/12 12:08 PM      Component Value Range Comment   Glucose-Capillary 202 (*) 70 - 99 mg/dL    Comment 1 Notify RN     GLUCOSE, CAPILLARY     Status: Abnormal   Collection Time   02/11/12  4:21 PM      Component Value Range Comment     Glucose-Capillary 67 (*) 70 - 99 mg/dL   GLUCOSE, CAPILLARY     Status: Normal   Collection Time   02/11/12  4:48 PM      Component Value Range Comment   Glucose-Capillary 98  70 - 99 mg/dL   GLUCOSE, CAPILLARY     Status: Abnormal   Collection Time   02/11/12  8:53 PM      Component Value Range Comment   Glucose-Capillary 236 (*) 70 - 99 mg/dL   GLUCOSE, CAPILLARY     Status: Abnormal   Collection Time   02/12/12  7:41 AM      Component Value Range Comment   Glucose-Capillary 125 (*) 70 - 99 mg/dL    Comment 1 Notify RN     GLUCOSE, CAPILLARY     Status: Abnormal   Collection Time   02/12/12 11:45 AM      Component Value Range Comment   Glucose-Capillary 273 (*) 70 - 99 mg/dL    Comment 1 Notify RN     GLUCOSE, CAPILLARY     Status: Abnormal   Collection Time   02/12/12  4:39 PM      Component Value Range Comment   Glucose-Capillary 198 (*) 70 - 99 mg/dL    Comment 1 Notify RN     GLUCOSE, CAPILLARY     Status: Abnormal   Collection Time   02/12/12  8:00 PM      Component Value Range Comment  Glucose-Capillary 278 (*) 70 - 99 mg/dL    Comment 1 Notify RN     GLUCOSE, CAPILLARY     Status: Abnormal   Collection Time   02/13/12  7:29 AM      Component Value Range Comment   Glucose-Capillary 101 (*) 70 - 99 mg/dL    Comment 1 Notify RN     GLUCOSE, CAPILLARY     Status: Abnormal   Collection Time   02/13/12 11:48 AM      Component Value Range Comment   Glucose-Capillary 171 (*) 70 - 99 mg/dL    Comment 1 Notify RN     GLUCOSE, CAPILLARY     Status: Normal   Collection Time   02/13/12  4:29 PM      Component Value Range Comment   Glucose-Capillary 75  70 - 99 mg/dL    Comment 1 Notify RN     GLUCOSE, CAPILLARY     Status: Abnormal   Collection Time   02/13/12  9:07 PM      Component Value Range Comment   Glucose-Capillary 324 (*) 70 - 99 mg/dL    Comment 1 Notify RN      Comment 2 Documented in Chart     GLUCOSE, CAPILLARY     Status: Abnormal   Collection Time    02/14/12  4:04 AM      Component Value Range Comment   Glucose-Capillary 216 (*) 70 - 99 mg/dL   GLUCOSE, CAPILLARY     Status: Abnormal   Collection Time   02/14/12  7:13 AM      Component Value Range Comment   Glucose-Capillary 144 (*) 70 - 99 mg/dL    Comment 1 Notify RN        HEENT: normal Cardio: RRR Resp: CTA B/L GI: BS negative and Distention Extremity:  No Edema Skin:   Intact Neuro: Alert/Oriented, Abnormal Sensory reduced in feet, Normal Motor and Dysarthric Musc/Skel:  Normal Gen NAD Mood/affect appropriate No evidence of tremor or cogwheel rigidity,+ masked facies Assessment/Plan: 1. Functional deficits secondary to deconditioning after dehydration in a pt with chronic multifactorial gait disorder associated with diabetic peripheral neuropathy and possible TIA which require 3+ hours per day of interdisciplinary therapy in a comprehensive inpatient rehab setting. Physiatrist is providing close team supervision and 24 hour management of active medical problems listed below. Physiatrist and rehab team continue to assess barriers to discharge/monitor patient progress toward functional and medical goals. FIM: FIM - Bathing Bathing Steps Patient Completed: Chest;Right Arm;Left Arm;Abdomen;Front perineal area;Buttocks (elected not to wash legs or feet today) Bathing: 5: Supervision: Safety issues/verbal cues  FIM - Upper Body Dressing/Undressing Upper body dressing/undressing steps patient completed: Thread/unthread right sleeve of pullover shirt/dresss;Put head through opening of pull over shirt/dress;Thread/unthread left sleeve of pullover shirt/dress;Pull shirt over trunk Upper body dressing/undressing: 5: Set-up assist to: Obtain clothing/put away FIM - Lower Body Dressing/Undressing Lower body dressing/undressing steps patient completed: Thread/unthread right underwear leg;Thread/unthread left underwear leg;Pull underwear up/down;Thread/unthread right pants  leg;Thread/unthread left pants leg;Pull pants up/down;Fasten/unfasten pants;Don/Doff right shoe;Don/Doff left shoe;Fasten/unfasten left shoe;Fasten/unfasten right shoe (didi not change socks) Lower body dressing/undressing: 5: Supervision: Safety issues/verbal cues  FIM - Toileting Toileting steps completed by patient: Adjust clothing prior to toileting;Performs perineal hygiene;Adjust clothing after toileting Toileting Assistive Devices: Grab bar or rail for support Toileting: 4: Steadying assist  FIM - Diplomatic Services operational officer Devices: Grab bars;Walker Toilet Transfers: 5-From toilet/BSC: Supervision (verbal cues/safety issues);5-To toilet/BSC: Supervision (verbal cues/safety issues)  FIM - Banker Devices: Arm rests;Walker Bed/Chair Transfer: 4: Bed > Chair or W/C: Min A (steadying Pt. > 75%);4: Chair or W/C > Bed: Min A (steadying Pt. > 75%)  FIM - Locomotion: Wheelchair Distance: 150 Locomotion: Wheelchair: 5: Travels 150 ft or more: maneuvers on rugs and over door sills with supervision, cueing or coaxing FIM - Locomotion: Ambulation Locomotion: Ambulation Assistive Devices: Designer, industrial/product Ambulation/Gait Assistance: 5: Supervision Locomotion: Ambulation: 2: Travels 50 - 149 ft with supervision/safety issues  Comprehension Comprehension Mode: Auditory Comprehension: 5-Follows basic conversation/direction: With no assist  Expression Expression Mode: Verbal Expression: 5-Expresses complex 90% of the time/cues < 10% of the time  Social Interaction Social Interaction Mode: Asleep Social Interaction: 5-Interacts appropriately 90% of the time - Needs monitoring or encouragement for participation or interaction.  Problem Solving Problem Solving Mode: Asleep Problem Solving: 5-Solves complex 90% of the time/cues < 10% of the time  Memory Memory Mode: Asleep Memory: 5-Recognizes or recalls 90% of the time/requires  cueing < 10% of the time  Medical Problem List and Plan:  1. DVT Prophylaxis/Anticoagulation: Pharmaceutical: Lovenox  2. Diabetic peripheral neuropathy: Continue neurontin 300 mg bid. May need dose adjusted if renal functions declines off IVF.  3. Mood: Seems motivated and upbeat. Will monitor for now.  4. Neuropsych: This patient is capable of making decisions on his/her own behalf.  5. HTN: monitor with bid checks.  6. DM type 2 with poor control: Monitor with AC/HS CBG checks. adjust lantus with meal coverage.  7. Chronic diastolic CHF: Check daily weights. Renal status much improved and IVF discontinued today. Monitor for signs of overload after and as we Continue to hold lasix. Add salt restrictions.  8. Diabetic foot ulcers: Continue foam dressing. Will add PRAFOs.. Shoes when OOB and pressure relief measures. Wounds are likley affecting balance as well 9.  Spastic bladder trial oxybutnin at noc, monitor for retention 10.  Probable spastic bowel hesistate increasing antispasmodic due to potential cognitive SE LOS (Days) 6 A FACE TO FACE EVALUATION WAS PERFORMED  KIRSTEINS,ANDREW E 02/14/2012, 9:04 AM

## 2012-02-14 NOTE — Significant Event (Signed)
Hypoglycemic Event  CBG: 69  Treatment: 15 GM carbohydrate snack  Symptoms: None  Follow-up CBG: Time:1657 CBG Result:114  Possible Reasons for Event: Unknown  Comments/MD notified:N/A    [name], [name]  Remember to initiate Hypoglycemia Order Set & complete

## 2012-02-15 ENCOUNTER — Inpatient Hospital Stay (HOSPITAL_COMMUNITY): Payer: Medicare Other | Admitting: Occupational Therapy

## 2012-02-15 ENCOUNTER — Inpatient Hospital Stay (HOSPITAL_COMMUNITY): Payer: Medicare Other

## 2012-02-15 DIAGNOSIS — M47812 Spondylosis without myelopathy or radiculopathy, cervical region: Secondary | ICD-10-CM

## 2012-02-15 DIAGNOSIS — R269 Unspecified abnormalities of gait and mobility: Secondary | ICD-10-CM

## 2012-02-15 DIAGNOSIS — R5381 Other malaise: Secondary | ICD-10-CM

## 2012-02-15 LAB — GLUCOSE, CAPILLARY: Glucose-Capillary: 185 mg/dL — ABNORMAL HIGH (ref 70–99)

## 2012-02-15 LAB — CREATININE, SERUM: GFR calc non Af Amer: 26 mL/min — ABNORMAL LOW (ref >90)

## 2012-02-15 NOTE — Progress Notes (Signed)
Physical Therapy Note  Patient Details  Name: [name] MRN: [id number] Date of Birth: 17-Oct-1919 Today's Date: 02/15/2012  11:00 - 12:00 60 minutes Individual session Patient denies pain.  Treatment focused on balance activities, Otaga HEP, and increasing independence with ambulation. Patient worked on Armed forces logistics/support/administrative officer working on weight shifts side to side, anterior/posterior, and in diagonals x 2 minutes each. Patient also worked on random control and limits of stability to work on balance reactions and weight shift. Patient instructed in and performed Otaga LE exercises x 10 reps. Patient reported that she wants to use rollator (like she does at home) instead of rolling walker. Patient ambulated with rollator 180 feet x 2 with close supervision to min assist - patient had 2 LOB that required min assist. Patient continues to say that she feels safer with rollator - this therapist feels it can get away from her too easily and recommends RW for home use.    Arelia Longest M 02/15/2012, 4:31 PM

## 2012-02-15 NOTE — Progress Notes (Signed)
Occupational Therapy Session Note  Patient Details  Name: [name] MRN: [id number] Date of Birth: May 08, 1919  Today's Date: 02/15/2012 Time: 1308-6578 Time Calculation (min): 46 min  Short Term Goals: Week 1:  OT Short Term Goal 1 (Week 1): Pt will perform all bathing with supervision in the shower sit to stand with use of seat. OT Short Term Goal 2 (Week 1): Pt will perform all dressing sit to stand for 2 consecutive sessions with supervision.   OT Short Term Goal 3 (Week 1): Pt will perform all aspects of toileting including transfer with supervision including transfer. OT Short Term Goal 4 (Week 1): Pt will perform walk-in shower transfer with RW and shower seat with supervision. OT Short Term Goal 5 (Week 1): Pt will perform FM coordination exercises with independence.  Skilled Therapeutic Interventions/Progress Updates:    Pt ambulated to the OT apartment with the rollator and close supervision.  Discussed bathroom setup at home and practiced walk-in shower transfer using simulated grab bars and shower seat.  Therapist continued discussion of feeling that the pt needs 24 hour supervision for safety at home.  Also noted with rollator use on the trip back to the room pt's right foot drug on the ground during her step X 2.  Feel this was attributable to weakness but also the speed of her use with the rollator and it being too far in front of her.  Discussed with her that the RW may be more safer at discharge based on this observation.    Therapy Documentation Precautions:  Precautions Precautions: Fall Precaution Comments: hx multiple falls Restrictions Weight Bearing Restrictions: No  Pain: Pain Assessment Pain Assessment: No/denies pain ADL: See FIM for current functional status  Therapy/Group: Individual Therapy  Sierah Lacewell OTR/L 02/15/2012, 3:57 PM

## 2012-02-15 NOTE — Progress Notes (Signed)
Inpatient Diabetes Program Recommendations  AACE/ADA: New Consensus Statement on Inpatient Glycemic Control (2013)  Target Ranges:  Prepandial:   less than 140 mg/dL      Peak postprandial:   less than 180 mg/dL (1-2 hours)      Critically ill patients:  140 - 180 mg/dL   Reason for Visit: Hypoglycemia  Inpatient Diabetes Program Recommendations Insulin - Basal: Current dose of Lantus insulin is 28 units at bedtime. Correction (SSI): Currently receiving sensitive correction tid with meals and HS correction scale.   Insulin - Meal Coverage: Currently receiving 6 units meal coverage tid with meals.  Note:   Results for [name], [name] (MRN [id number]) as of 02/15/2012 14:08  Ref. Range 02/13/2012 07:29 02/13/2012 11:48 02/13/2012 16:29 02/13/2012 21:07 02/14/2012 04:04  Glucose-Capillary Latest Range: 70-99 mg/dL 409 (H) 811 (H) 75 914 (H) 216 (H)    Results for [name], [name] (MRN [id number]) as of 02/15/2012 14:08  Ref. Range 02/14/2012 07:13 02/14/2012 11:24 02/14/2012 16:38 02/14/2012 16:57 02/14/2012 20:08 02/15/2012 07:19 02/15/2012 12:48  Glucose-Capillary Latest Range: 70-99 mg/dL 086 (H) 578 (H) 69 (L) 114 (H) 226 (H) 113 (H) 185 (H)   For the past 2 days lowest CBG has been just before supper.  Please consider reducing meal coverage at lunch to 4 units -- keeping it 6 units with breakfast and supper.  Thank you. Shakeem Stern S. Elsie Lincoln, RN, CNS, CDE Inpatient Diabetes Program, team pager [phone]

## 2012-02-15 NOTE — Progress Notes (Signed)
Occupational Therapy Session Note  Patient Details  Name: [name] MRN: [id number] Date of Birth: Jan 11, 1920  Today's Date: 02/15/2012 Time: 0903-1001 Time Calculation (min): 58 min  Short Term Goals: Week 1:  OT Short Term Goal 1 (Week 1): Pt will perform all bathing with supervision in the shower sit to stand with use of seat. OT Short Term Goal 2 (Week 1): Pt will perform all dressing sit to stand for 2 consecutive sessions with supervision.   OT Short Term Goal 3 (Week 1): Pt will perform all aspects of toileting including transfer with supervision including transfer. OT Short Term Goal 4 (Week 1): Pt will perform walk-in shower transfer with RW and shower seat with supervision. OT Short Term Goal 5 (Week 1): Pt will perform FM coordination exercises with independence.  Skilled Therapeutic Interventions/Progress Updates:    Pt decided to take a sponge bath at the sink today since she showered yesterday.  Gathered clothes and towels using the RW and close supervision.  Sat in her wheelchair at the sink for UB bathing and dressing.  Pt declined washing her LB this morning.  Performed grooming tasks in sitting with setup as well.  Pt performed LB dressing sit to stand with min assist.  Needs min instructional cueing for walker usage when gathering clothes and putting towels in the linen bag.  At times does not bring her walker all the way back with her to the surface she is going to sit on.    Therapy Documentation Precautions:  Precautions Precautions: Fall Precaution Comments: hx multiple falls Restrictions Weight Bearing Restrictions: No  Pain: Pain Assessment Pain Assessment: No/denies pain ADL: See FIM for current functional status  Therapy/Group: Individual Therapy  Kateryna Grantham OTR/L 02/15/2012, 10:01 AM

## 2012-02-15 NOTE — Progress Notes (Signed)
Occupational Therapy Session Note  Patient Details  Name: [name] MRN: [id number] Date of Birth: 12/24/1919  Today's Date: 02/15/2012 Time: 9629-5284 Time Calculation (min): 28 min   Skilled Therapeutic Interventions/Progress Updates:    Patient seen this pm for OT to address dynamic stand balance in functional activity.  Patient ambulated to ADL apartment with rollator.  Patient required min assist to supervision.  Patient with decreased control of right lower extremity during walking tasks.  Patient able to reach in low drawers, change linen on pillows on bed, and manage doors with min cueing.   Therapy Documentation Precautions:  Precautions Precautions: Fall Precaution Comments: hx multiple falls Restrictions Weight Bearing Restrictions: No Pain: Pain Assessment Pain Assessment: No/denies pain  See FIM for current functional status  Therapy/Group: Individual Therapy  [name] 02/15/2012, 1:28 PM

## 2012-02-15 NOTE — Progress Notes (Signed)
Social Work Patient ID: [name], female   DOB: October 15, 1919, 79 y.o.   MRN: [id number] Team has changed pt's goals to supervision level for her safety, due to feel high risk to fall at home.  Discussed with pt and left a message for daughter. Pt would be willing to go back to [facility] where she has gone several times before.  She has discussed hiring a caregiver and realizes the expense. Will touch base with pt in the am and see what she and daughter have decided.  Pt does feel her left side is weaker than her right and is concerned about this.

## 2012-02-15 NOTE — Progress Notes (Signed)
Patient ID: [name], female   DOB: 08-01-1919, 79 y.o.   MRN: [id number] Subjective/Complaints: 79 y.o. female with history of DM, chronic diarrhes, gait disorder with multiple falls, who was admitted 02/03/12 with increasing weakness, speech difficulties with right sided weakness that started the day after Christmas. Patient with history of mild right sided weakness for approximately two months and waxing and waning of dysarthria. She was noted to have acute on chronic renal failure with BUN 38/Cr 1.92 and was started on IVF for hydration and diuretics discontinued. MRI/MRA brain without acute changes. Neurology consulted and MRI C-spine with multifactorial cervical stenosis from C2/C3 to C6/C7 with moderate to severe foraminal stenosis. Speech and strength have improved since admission. MBS done 1/11 due to history of prior dysphagia and patient placed on D3, thin liquids. UCS negative. Patient with history of chronic diarrhea--C-diff check 02/05/11 was negative.  No problems overnite Labs and vitals reviewed   Review of Systems  Constitutional: Positive for malaise/fatigue.  Gastrointestinal: Positive for diarrhea.       Chronic diarrhea for 70 years  All other systems reviewed and are negative.    Objective: Vital Signs: Blood pressure 136/56, pulse 59, temperature 97.5 F (36.4 C), temperature source Oral, resp. rate 17, height 5\' 8"  (1.727 m), weight 78.5 kg (173 lb 1 oz), SpO2 92.00%. No results found. Results for orders placed during the hospital encounter of 02/08/12 (from the past 72 hour(s))  GLUCOSE, CAPILLARY     Status: Abnormal   Collection Time   02/12/12 11:45 AM      Component Value Range Comment   Glucose-Capillary 273 (*) 70 - 99 mg/dL    Comment 1 Notify RN     GLUCOSE, CAPILLARY     Status: Abnormal   Collection Time   02/12/12  4:39 PM      Component Value Range Comment   Glucose-Capillary 198 (*) 70 - 99 mg/dL    Comment 1 Notify RN     GLUCOSE, CAPILLARY      Status: Abnormal   Collection Time   02/12/12  8:00 PM      Component Value Range Comment   Glucose-Capillary 278 (*) 70 - 99 mg/dL    Comment 1 Notify RN     GLUCOSE, CAPILLARY     Status: Abnormal   Collection Time   02/13/12  7:29 AM      Component Value Range Comment   Glucose-Capillary 101 (*) 70 - 99 mg/dL    Comment 1 Notify RN     GLUCOSE, CAPILLARY     Status: Abnormal   Collection Time   02/13/12 11:48 AM      Component Value Range Comment   Glucose-Capillary 171 (*) 70 - 99 mg/dL    Comment 1 Notify RN     GLUCOSE, CAPILLARY     Status: Normal   Collection Time   02/13/12  4:29 PM      Component Value Range Comment   Glucose-Capillary 75  70 - 99 mg/dL    Comment 1 Notify RN     GLUCOSE, CAPILLARY     Status: Abnormal   Collection Time   02/13/12  9:07 PM      Component Value Range Comment   Glucose-Capillary 324 (*) 70 - 99 mg/dL    Comment 1 Notify RN      Comment 2 Documented in Chart     GLUCOSE, CAPILLARY     Status: Abnormal   Collection Time  02/14/12  4:04 AM      Component Value Range Comment   Glucose-Capillary 216 (*) 70 - 99 mg/dL   GLUCOSE, CAPILLARY     Status: Abnormal   Collection Time   02/14/12  7:13 AM      Component Value Range Comment   Glucose-Capillary 144 (*) 70 - 99 mg/dL    Comment 1 Notify RN     GLUCOSE, CAPILLARY     Status: Abnormal   Collection Time   02/14/12 11:24 AM      Component Value Range Comment   Glucose-Capillary 205 (*) 70 - 99 mg/dL    Comment 1 Notify RN     GLUCOSE, CAPILLARY     Status: Abnormal   Collection Time   02/14/12  4:38 PM      Component Value Range Comment   Glucose-Capillary 69 (*) 70 - 99 mg/dL    Comment 1 Notify RN     GLUCOSE, CAPILLARY     Status: Abnormal   Collection Time   02/14/12  4:57 PM      Component Value Range Comment   Glucose-Capillary 114 (*) 70 - 99 mg/dL   GLUCOSE, CAPILLARY     Status: Abnormal   Collection Time   02/14/12  8:08 PM      Component Value Range Comment    Glucose-Capillary 226 (*) 70 - 99 mg/dL   GLUCOSE, CAPILLARY     Status: Abnormal   Collection Time   02/15/12  7:19 AM      Component Value Range Comment   Glucose-Capillary 113 (*) 70 - 99 mg/dL      HEENT: normal Cardio: RRR Resp: CTA B/L GI: BS negative and Distention Extremity:  No Edema Skin:   Intact Neuro: Alert/Oriented, Abnormal Sensory reduced in feet, Normal Motor and Dysarthric Musc/Skel:  Normal Gen NAD Mood/affect appropriate No evidence of tremor or cogwheel rigidity,+ masked facies Assessment/Plan: 1. Functional deficits secondary to deconditioning after dehydration in a pt with chronic multifactorial gait disorder associated with diabetic peripheral neuropathy and possible TIA which require 3+ hours per day of interdisciplinary therapy in a comprehensive inpatient rehab setting. Physiatrist is providing close team supervision and 24 hour management of active medical problems listed below. Physiatrist and rehab team continue to assess barriers to discharge/monitor patient progress toward functional and medical goals. FIM: FIM - Bathing Bathing Steps Patient Completed: Chest;Right Arm;Left Arm;Abdomen;Front perineal area;Buttocks;Left upper leg;Right lower leg (including foot);Left lower leg (including foot) Bathing: 5: Set-up assist to: Open items  FIM - Upper Body Dressing/Undressing Upper body dressing/undressing steps patient completed: Thread/unthread right bra strap;Thread/unthread left bra strap;Thread/unthread right sleeve of pullover shirt/dresss;Thread/unthread left sleeve of pullover shirt/dress;Put head through opening of pull over shirt/dress Upper body dressing/undressing: 4: Min-Patient completed 75 plus % of tasks FIM - Lower Body Dressing/Undressing Lower body dressing/undressing steps patient completed: Thread/unthread right underwear leg;Thread/unthread left underwear leg;Pull underwear up/down;Thread/unthread right pants leg;Thread/unthread left  pants leg;Pull pants up/down;Fasten/unfasten pants;Don/Doff right shoe;Don/Doff left shoe;Fasten/unfasten left shoe;Fasten/unfasten right shoe (didi not change socks) Lower body dressing/undressing: 5: Supervision: Safety issues/verbal cues  FIM - Toileting Toileting steps completed by patient: Adjust clothing prior to toileting;Performs perineal hygiene;Adjust clothing after toileting Toileting Assistive Devices: Grab bar or rail for support Toileting: 4: Steadying assist  FIM - Diplomatic Services operational officer Devices: Building control surveyor Transfers: 4-To toilet/BSC: Min A (steadying Pt. > 75%);4-From toilet/BSC: Min A (steadying Pt. > 75%)  FIM - Bed/Chair Transfer Bed/Chair Transfer Assistive Devices: Arm rests;Dan Humphreys  Bed/Chair Transfer: 4: Bed > Chair or W/C: Min A (steadying Pt. > 75%);4: Chair or W/C > Bed: Min A (steadying Pt. > 75%)  FIM - Locomotion: Wheelchair Distance: 150 Locomotion: Wheelchair: 1: Total Assistance/staff pushes wheelchair (Pt<25%) FIM - Locomotion: Ambulation Locomotion: Ambulation Assistive Devices: Designer, industrial/product Ambulation/Gait Assistance: 5: Supervision Locomotion: Ambulation: 0: Activity did not occur  Comprehension Comprehension Mode: Auditory Comprehension: 5-Follows basic conversation/direction: With no assist  Expression Expression Mode: Verbal Expression: 5-Expresses complex 90% of the time/cues < 10% of the time  Social Interaction Social Interaction Mode: Asleep Social Interaction: 5-Interacts appropriately 90% of the time - Needs monitoring or encouragement for participation or interaction.  Problem Solving Problem Solving Mode: Asleep Problem Solving: 5-Solves basic 90% of the time/requires cueing < 10% of the time  Memory Memory Mode: Asleep Memory: 4-Recognizes or recalls 75 - 89% of the time/requires cueing 10 - 24% of the time  Medical Problem List and Plan:  1. DVT Prophylaxis/Anticoagulation:  Pharmaceutical: Lovenox  2. Diabetic peripheral neuropathy: Continue neurontin 300 mg bid. May need dose adjusted if renal functions declines off IVF.  3. Mood: Seems motivated and upbeat. Will monitor for now.  4. Neuropsych: This patient is capable of making decisions on his/her own behalf.  5. HTN: monitor with bid checks.  6. DM type 2 with poor control: Monitor with AC/HS CBG checks. adjust lantus with meal coverage.  7. Chronic diastolic CHF: Check daily weights. Renal status much improved and IVF discontinued today. Monitor for signs of overload after and as we Continue to hold lasix. Add salt restrictions.  8. Diabetic foot ulcers: Continue foam dressing. Will add PRAFOs.. Shoes when OOB and pressure relief measures. Wounds are likley affecting balance as well 9.  Spastic bladder trial oxybutnin at noc, monitor for retention 10.  Probable spastic bowel hesistate increasing antispasmodic due to potential cognitive SE LOS (Days) 7 A FACE TO FACE EVALUATION WAS PERFORMED  KIRSTEINS,ANDREW E 02/15/2012, 8:28 AM

## 2012-02-16 ENCOUNTER — Inpatient Hospital Stay (HOSPITAL_COMMUNITY): Payer: Medicare Other

## 2012-02-16 ENCOUNTER — Inpatient Hospital Stay (HOSPITAL_COMMUNITY): Payer: Medicare Other | Admitting: Occupational Therapy

## 2012-02-16 LAB — BASIC METABOLIC PANEL
CO2: 26 mEq/L (ref 19–32)
Chloride: 96 mEq/L (ref 96–112)
GFR calc Af Amer: 31 mL/min — ABNORMAL LOW (ref >90)
Potassium: 4.5 mEq/L (ref 3.5–5.1)

## 2012-02-16 LAB — GLUCOSE, CAPILLARY
Glucose-Capillary: 121 mg/dL — ABNORMAL HIGH (ref 70–99)
Glucose-Capillary: 138 mg/dL — ABNORMAL HIGH (ref 70–99)
Glucose-Capillary: 146 mg/dL — ABNORMAL HIGH (ref 70–99)
Glucose-Capillary: 169 mg/dL — ABNORMAL HIGH (ref 70–99)

## 2012-02-16 MED ORDER — INSULIN ASPART 100 UNIT/ML ~~LOC~~ SOLN
6.0000 [IU] | Freq: Two times a day (BID) | SUBCUTANEOUS | Status: DC
  Administered 2012-02-16 – 2012-02-17 (×2): 6 [IU] via SUBCUTANEOUS

## 2012-02-16 MED ORDER — INSULIN ASPART 100 UNIT/ML ~~LOC~~ SOLN
4.0000 [IU] | Freq: Every day | SUBCUTANEOUS | Status: DC
  Administered 2012-02-16 – 2012-02-17 (×2): 4 [IU] via SUBCUTANEOUS

## 2012-02-16 MED ORDER — OXYBUTYNIN CHLORIDE 5 MG PO TABS
10.0000 mg | ORAL_TABLET | Freq: Every day | ORAL | Status: DC
  Administered 2012-02-16: 10 mg via ORAL
  Filled 2012-02-16 (×2): qty 2

## 2012-02-16 NOTE — Progress Notes (Signed)
Patient ID: [name], female   DOB: 1919/02/17, 79 y.o.   MRN: [id number] Subjective/Complaints: 79 y.o. female with history of DM, chronic diarrhes, gait disorder with multiple falls, who was admitted 02/03/12 with increasing weakness, speech difficulties with right sided weakness that started the day after Christmas. Patient with history of mild right sided weakness for approximately two months and waxing and waning of dysarthria. She was noted to have acute on chronic renal failure with BUN 38/Cr 1.92 and was started on IVF for hydration and diuretics discontinued. MRI/MRA brain without acute changes. Neurology consulted and MRI C-spine with multifactorial cervical stenosis from C2/C3 to C6/C7 with  Severe Left C3-4,4-5 , Right C4-5, C5-6  foraminal stenosis. Speech and strength have improved since admission. MBS done 1/11 due to history of prior dysphagia and patient placed on D3, thin liquids. UCS negative. Patient with history of chronic diarrhea--C-diff check 02/05/11 was negative.  No problems overnite Labs and vitals reviewed No c/o of weakness  Review of Systems  Constitutional: Positive for malaise/fatigue.  Gastrointestinal: Positive for diarrhea.       Chronic diarrhea for 70 years  All other systems reviewed and are negative.   Objective: Vital Signs: Blood pressure 124/71, pulse 57, temperature 97.4 F (36.3 C), temperature source Oral, resp. rate 18, height 5\' 8"  (1.727 m), weight 79.3 kg (174 lb 13.2 oz), SpO2 91.00%. No results found. Results for orders placed during the hospital encounter of 02/08/12 (from the past 72 hour(s))  GLUCOSE, CAPILLARY     Status: Abnormal   Collection Time   02/13/12 11:48 AM      Component Value Range Comment   Glucose-Capillary 171 (*) 70 - 99 mg/dL    Comment 1 Notify RN     GLUCOSE, CAPILLARY     Status: Normal   Collection Time   02/13/12  4:29 PM      Component Value Range Comment   Glucose-Capillary 75  70 - 99 mg/dL    Comment 1  Notify RN     GLUCOSE, CAPILLARY     Status: Abnormal   Collection Time   02/13/12  9:07 PM      Component Value Range Comment   Glucose-Capillary 324 (*) 70 - 99 mg/dL    Comment 1 Notify RN      Comment 2 Documented in Chart     GLUCOSE, CAPILLARY     Status: Abnormal   Collection Time   02/14/12  4:04 AM      Component Value Range Comment   Glucose-Capillary 216 (*) 70 - 99 mg/dL   GLUCOSE, CAPILLARY     Status: Abnormal   Collection Time   02/14/12  7:13 AM      Component Value Range Comment   Glucose-Capillary 144 (*) 70 - 99 mg/dL    Comment 1 Notify RN     GLUCOSE, CAPILLARY     Status: Abnormal   Collection Time   02/14/12 11:24 AM      Component Value Range Comment   Glucose-Capillary 205 (*) 70 - 99 mg/dL    Comment 1 Notify RN     GLUCOSE, CAPILLARY     Status: Abnormal   Collection Time   02/14/12  4:38 PM      Component Value Range Comment   Glucose-Capillary 69 (*) 70 - 99 mg/dL    Comment 1 Notify RN     GLUCOSE, CAPILLARY     Status: Abnormal   Collection Time   02/14/12  4:57 PM      Component Value Range Comment   Glucose-Capillary 114 (*) 70 - 99 mg/dL   GLUCOSE, CAPILLARY     Status: Abnormal   Collection Time   02/14/12  8:08 PM      Component Value Range Comment   Glucose-Capillary 226 (*) 70 - 99 mg/dL   CREATININE, SERUM     Status: Abnormal   Collection Time   02/15/12  7:05 AM      Component Value Range Comment   Creatinine, Ser 1.68 (*) 0.50 - 1.10 mg/dL    GFR calc non Af Amer 26 (*) >90 mL/min    GFR calc Af Amer 30 (*) >90 mL/min   GLUCOSE, CAPILLARY     Status: Abnormal   Collection Time   02/15/12  7:19 AM      Component Value Range Comment   Glucose-Capillary 113 (*) 70 - 99 mg/dL   GLUCOSE, CAPILLARY     Status: Abnormal   Collection Time   02/15/12 12:48 PM      Component Value Range Comment   Glucose-Capillary 185 (*) 70 - 99 mg/dL    Comment 1 Notify RN     GLUCOSE, CAPILLARY     Status: Abnormal   Collection Time   02/15/12  4:38  PM      Component Value Range Comment   Glucose-Capillary 133 (*) 70 - 99 mg/dL    Comment 1 Notify RN     GLUCOSE, CAPILLARY     Status: Abnormal   Collection Time   02/15/12  8:26 PM      Component Value Range Comment   Glucose-Capillary 204 (*) 70 - 99 mg/dL    Comment 1 Notify RN     GLUCOSE, CAPILLARY     Status: Abnormal   Collection Time   02/16/12  7:13 AM      Component Value Range Comment   Glucose-Capillary 121 (*) 70 - 99 mg/dL    Comment 1 Notify RN     BASIC METABOLIC PANEL     Status: Abnormal   Collection Time   02/16/12  8:32 AM      Component Value Range Comment   Sodium 134 (*) 135 - 145 mEq/L    Potassium 4.5  3.5 - 5.1 mEq/L    Chloride 96  96 - 112 mEq/L    CO2 26  19 - 32 mEq/L    Glucose, Bld 182 (*) 70 - 99 mg/dL    BUN 38 (*) 6 - 23 mg/dL    Creatinine, Ser 1.61 (*) 0.50 - 1.10 mg/dL    Calcium 9.6  8.4 - 09.6 mg/dL    GFR calc non Af Amer 27 (*) >90 mL/min    GFR calc Af Amer 31 (*) >90 mL/min      HEENT: normal Cardio: RRR Resp: CTA B/L GI: BS negative and Distention Extremity:  No Edema Skin:   Intact Neuro: Alert/Oriented, Abnormal Sensory reduced in feet, Normal Motor and Dysarthric, 5/5 in BUE, 4/5 in B quads, 3-/5 ankle DF/PF and toes flexors and extensors Musc/Skel:  Normal Gen NAD Mood/affect appropriate No evidence of tremor or cogwheel rigidity,+ masked facies Assessment/Plan: 1. Functional deficits secondary to deconditioning after dehydration in a pt with chronic multifactorial gait disorder associated with diabetic peripheral neuropathy and possible TIA which require 3+ hours per day of interdisciplinary therapy in a comprehensive inpatient rehab setting. Physiatrist is providing close team supervision and 24 hour management of active medical  problems listed below. Physiatrist and rehab team continue to assess barriers to discharge/monitor patient progress toward functional and medical goals. FIM: FIM - Bathing Bathing Steps  Patient Completed: Chest;Right Arm;Left Arm;Abdomen Bathing: 5: Supervision: Safety issues/verbal cues (Pt only washed her UB)  FIM - Upper Body Dressing/Undressing Upper body dressing/undressing steps patient completed: Thread/unthread right sleeve of pullover shirt/dresss;Thread/unthread left sleeve of pullover shirt/dress;Put head through opening of pull over shirt/dress;Pull shirt over trunk Upper body dressing/undressing: 5: Supervision: Safety issues/verbal cues (Pt did not remove bra for sponge bath) FIM - Lower Body Dressing/Undressing Lower body dressing/undressing steps patient completed: Thread/unthread left pants leg;Pull pants up/down;Thread/unthread right pants leg;Don/Doff right sock;Don/Doff left sock;Don/Doff right shoe;Don/Doff left shoe;Fasten/unfasten left shoe;Fasten/unfasten right shoe Lower body dressing/undressing: 4: Min-Patient completed 75 plus % of tasks  FIM - Toileting Toileting steps completed by patient: Adjust clothing prior to toileting;Performs perineal hygiene;Adjust clothing after toileting Toileting Assistive Devices: Grab bar or rail for support Toileting: 6: More than reasonable amount of time  FIM - Diplomatic Services operational officer Devices: Walker;Grab bars Toilet Transfers: 5-To toilet/BSC: Supervision (verbal cues/safety issues);5-From toilet/BSC: Supervision (verbal cues/safety issues)  FIM - Banker Devices: Walker;Arm rests Bed/Chair Transfer: 4: Bed > Chair or W/C: Min A (steadying Pt. > 75%);4: Chair or W/C > Bed: Min A (steadying Pt. > 75%)  FIM - Locomotion: Wheelchair Distance: 150 Locomotion: Wheelchair: 1: Total Assistance/staff pushes wheelchair (Pt<25%) FIM - Locomotion: Ambulation Locomotion: Ambulation Assistive Devices: Designer, industrial/product Ambulation/Gait Assistance: 5: Supervision Locomotion: Ambulation: 5: Travels 150 ft or more with supervision/safety  issues  Comprehension Comprehension Mode: Auditory Comprehension: 5-Understands basic 90% of the time/requires cueing < 10% of the time  Expression Expression Mode: Verbal Expression: 5-Expresses basic 90% of the time/requires cueing < 10% of the time.  Social Interaction Social Interaction Mode: Asleep Social Interaction: 5-Interacts appropriately 90% of the time - Needs monitoring or encouragement for participation or interaction.  Problem Solving Problem Solving Mode: Asleep Problem Solving: 5-Solves complex 90% of the time/cues < 10% of the time  Memory Memory Mode: Asleep Memory: 5-Recognizes or recalls 90% of the time/requires cueing < 10% of the time  Medical Problem List and Plan:  1. DVT Prophylaxis/Anticoagulation: Pharmaceutical: Lovenox  2. Diabetic peripheral neuropathy: Continue neurontin 300 mg bid. May need dose adjusted if renal functions declines off IVF.  3. Mood: Seems motivated and upbeat. Will monitor for now.  4. Neuropsych: This patient is capable of making decisions on his/her own behalf.  5. HTN: monitor with bid checks.  6. DM type 2 with poor control: Monitor with AC/HS CBG checks. adjust lantus with meal coverage.  7. Chronic diastolic CHF: Check daily weights. Renal status much improved and IVF discontinued today. Monitor for signs of overload after and as we Continue to hold lasix. Add salt restrictions.  8. Diabetic foot ulcers: Continue foam dressing. Will add PRAFOs.. Shoes when OOB and pressure relief measures. Wounds are likley affecting balance as well 9.  Spastic bladder trial oxybutnin at noc, monitor for retention, still c/o nocturia will increase dose 10.  Probable spastic bowel hesistate increasing antispasmodic due to potential cognitive SE LOS (Days) 8 A FACE TO FACE EVALUATION WAS PERFORMED  KIRSTEINS,ANDREW E 02/16/2012, 9:28 AM

## 2012-02-16 NOTE — Plan of Care (Signed)
Problem: RH KNOWLEDGE DEFICIT Goal: RH STG INCREASE KNOWLEDGE OF HYPERTENSION Patient will be able to verbalize the normal blood pressure, how to check blood pressure, know the importance of being compliance with medication and diet regimen.  Outcome: Progressing Per pt her daughter helps her with her medication

## 2012-02-16 NOTE — Patient Care Conference (Signed)
Inpatient RehabilitationTeam Conference and Plan of Care Update Date: 02/16/2012   Time: 10:30 AM    Patient Name: [name]      Medical Record Number: [id number]  Date of Birth: 04/05/1919 Sex: Female         Room/Bed: 4145/4145-01 Payor Info: Payor: MEDICARE  Plan: MEDICARE PART A AND B  Product Type: *No Product type*     Admitting Diagnosis: L CVA  Admit Date/Time:  02/08/2012  4:45 PM Admission Comments: No comment available   Primary Diagnosis:  Multifactorial gait disorder Principal Problem: Multifactorial gait disorder  Patient Active Problem List   Diagnosis Date Noted  . Multifactorial gait disorder 02/08/2012  . Dysarthria 02/06/2012  . Renal failure (ARF), acute on chronic 02/04/2012  . UTI (lower urinary tract infection) 04/21/2011  . Dehydration 04/21/2011  . Hypothyroidism 04/21/2011  . DIABETIC PERIPHERAL NEUROPATHY 12/14/2006  . DIABETES MELLITUS, TYPE II, UNCONTROLLED 12/08/2006  . HYPERTENSION, BENIGN ESSENTIAL 12/08/2006    Expected Discharge Date: Expected Discharge Date: 02/17/12  Team Members Present: Physician leading conference: Dr. Claudette Laws Social Worker Present: Dossie Der, LCSW Nurse Present: Gregor Hams, RN PT Present: Edman Circle, PT;Caroline Adriana Simas, PT;Other (comment) Clarisse Gouge ripa-PT) OT Present: Leonette Monarch, OT SLP Present: Fae Pippin, SLP Other (Discipline and Name): Vernona Rieger Jobe-Dietician     Current Status/Progress Goal Weekly Team Focus  Medical   loose stool twice a day but continent, very dhigh fall risk due to diabetic neuropathy  improve nocturia  Family training for D/C   Bowel/Bladder   continent of bladder.  no bowel movement this shift  remain continent of bowel and bladder  toilet patient q4prn at night.   Swallow/Nutrition/ Hydration             ADL's   Currently min assist to supervision level for bathing and dressing tasks and toileting.  She had occasional LOB in standing backwards if she doesnt  have an UE stabilized on a surface or her walker.  Feel she is a high fall risk and needs 24 hour supervision for functional transfers and selfcare tasks.    goals downgraded to supervision level from modified independent initially  dynamic standing balance, safety with selfcare tasks, selfcare retraining tasks.   Mobility   supervision bed mobility, transfers; close supervision with rolling walker ambulation and up/down 2 steps with rails  goals downgraded to supervision level  Patient/family education for discharge; HEP; increasing independence with gait.   Communication             Safety/Cognition/ Behavioral Observations            Pain   denies pain         Skin   silvadene cream applied to bilateral heels  no new skin breakdown  assess skin q shift      *See Interdisciplinary Assessment and Plan and progress notes for long and short-term goals  Barriers to Discharge: see above    Possible Resolutions to Barriers:  caregiver training    Discharge Planning/Teaching Needs:  Therapy goals changed to supervision leve, pt does not have this.  Decision to go NHP short term      Team Discussion:  Team's concerns regarding caregiver situation.  Sensation issues with not feeling her feet or knowing where they are on the floor.  Need family education prior to discharge.  Pt quality of life issues and wanting to go home.  MD giving med to lessen frequency of urination.  Revisions to Treatment Plan:  Revised goals to supervision level due to high risk of falling and safety   Continued Need for Acute Rehabilitation Level of Care: The patient requires daily medical management by a physician with specialized training in physical medicine and rehabilitation for the following conditions: Daily direction of a multidisciplinary physical rehabilitation program to ensure safe treatment while eliciting the highest outcome that is of practical value to the patient.: Yes Daily medical management of  patient stability for increased activity during participation in an intensive rehabilitation regime.: Yes Daily analysis of laboratory values and/or radiology reports with any subsequent need for medication adjustment of medical intervention for : Neurological problems;Other  Lucy Chris 02/16/2012, 12:59 PM

## 2012-02-16 NOTE — Progress Notes (Addendum)
Occupational Therapy Session Note  Patient Details  Name: [name] MRN: [id number] Date of Birth: 1919-05-16  Today's Date: 02/16/2012 Time: 0905-1001 Time Calculation (min): 56 min  Short Term Goals: Week 1:  OT Short Term Goal 1 (Week 1): Pt will perform all bathing with supervision in the shower sit to stand with use of seat. OT Short Term Goal 2 (Week 1): Pt will perform all dressing sit to stand for 2 consecutive sessions with supervision.   OT Short Term Goal 3 (Week 1): Pt will perform all aspects of toileting including transfer with supervision including transfer. OT Short Term Goal 4 (Week 1): Pt will perform walk-in shower transfer with RW and shower seat with supervision. OT Short Term Goal 5 (Week 1): Pt will perform FM coordination exercises with independence.  Skilled Therapeutic Interventions/Progress Updates:    Bathing and dressing session this am.  Pt able to ambulate to the walk-in shower with supervision using the RW.  Needed min instructional cueing to make sure she backs all the way up to the shower seat with the walker in front of her before sitting down.  Mod instructional cueing also for sitting down in a controlled manner and not flopping.  Able to perform all bathing and dressing sit to stand with supervision except for pulling her sports bra down in the back.    Therapy Documentation Precautions:  Precautions Precautions: Fall Precaution Comments: hx multiple falls Restrictions Weight Bearing Restrictions: No  Pain: Pain Assessment Pain Assessment: No/denies pain ADL: See FIM for current functional status  Therapy/Group: Individual Therapy  Damere Brandenburg OTR/L 02/16/2012, 10:02 AM

## 2012-02-16 NOTE — Progress Notes (Signed)
Occupational Therapy Discharge Summary  Patient Details  Name: [name] MRN: [id number] Date of Birth: 11/27/1919  Today's Date: 02/16/2012 Time: 4098-1191 Time Calculation (min): 44 min  Patient has met 8 of 9 long term goals due to improved activity tolerance, improved balance and improved coordination.  Patient to discharge at overall Supervision level.  Patient's care partner unavailable to provide the necessary physical assistance at discharge.  Strongly recommend that pt have 24 hour initial supervision based on progress, history of falls, and continued high fall risk.  [name] trying to arrange as much supervision as she can and understands that we are recommending some be with her at all times during functional transfers, toileting, selfcare tasks, and bathing.    Reasons goals not met: Pt needs min assist for pulling down her sports bra in the back.  Recommendation:  Patient will benefit from ongoing skilled OT services in home health setting to continue to advance functional skills in the area of BADL.  Feel pt needs continued work on safety, balance, RUE coordination and functional shoulder AROM, and performance of basic ADLS within her home environment.  Equipment: tub/shower seat and 3:1  Reasons for discharge: treatment goals met and discharge from hospital  Patient/family agrees with progress made and goals achieved: Yes  OT Discharge Precautions/Restrictions   fall risk  Pain Pain Assessment Pain Assessment: No/denies pain Pain Score:   2 Pain Type: Acute pain Pain Location: Shoulder Pain Orientation: Right Pain Intervention(s): Repositioned Multiple Pain Sites: No ADL ADL Eating: Independent Where Assessed-Eating: Chair Grooming: Setup Where Assessed-Grooming: Wheelchair;Sitting at sink Upper Body Bathing: Supervision/safety Where Assessed-Upper Body Bathing: Shower;Other (Comment) (sitting on tub seat) Lower Body Bathing: Supervision/safety Where  Assessed-Lower Body Bathing: Shower;Other (Comment) (sit to stand on shower seat) Upper Body Dressing: Minimal assistance Where Assessed-Upper Body Dressing: Sitting at sink Lower Body Dressing: Supervision/safety Where Assessed-Lower Body Dressing: Sitting at sink;Standing at sink;Wheelchair Toileting: Supervision/safety Where Assessed-Toileting: Hotel manager: Close supervision Toilet Transfer Method: Proofreader: Gaffer: Not assessed Tub/Shower Transfer Method: Ship broker: Insurance underwriter: Close supervision;Minimal cueing Film/video editor Method: Designer, industrial/product: Shower seat with back ADL Comments: [name] overall supervision for selfcare tasks and functional transfers with the exception of UB dressing for which she needs min assist for pulling her sports bra down in the back. Vision/Perception  Vision - History Baseline Vision: Wears glasses all the time Patient Visual Report: No change from baseline Vision - Assessment Vision Assessment: Vision not tested Perception Perception: Within Functional Limits Praxis Praxis: Intact  Cognition Overall Cognitive Status: Appears within functional limits for tasks assessed Arousal/Alertness: Awake/alert Orientation Level: Oriented X4 Attention: Sustained Focused Attention: Appears intact Sustained Attention: Appears intact Memory: Appears intact Awareness: Impaired Awareness Impairment: Anticipatory impairment Safety/Judgment: Impaired Comments: Pt with decreased anticipatory awareness of her increased chance/risk of falling using the rollator. Sensation Sensation Light Touch: Impaired Detail Light Touch Impaired Details: Impaired RUE Additional Comments: Pt reports a slight decrease in light touch sensation in the ulnar border of her right hand. Coordination Gross Motor Movements are  Fluid and Coordinated: Yes Fine Motor Movements are Fluid and Coordinated: No Coordination and Movement Description: Pt with slight decreased FM manipulation including opposition in the right hand.  Able to oppose thumb to all digits except the 4th and 5th. Finger Nose Finger Test: WFLs bilaterally Motor  Motor Motor: Abnormal postural alignment and control Motor - Discharge Observations: Pt still  with increased thoracic and cervical kyphosis but is able to achieve increased anterior pelvic tilt and lumbar extension compared to initial evaluation. Mobility  Transfers Sit to Stand: 5: Supervision;With upper extremity assist;With armrests;From chair/3-in-1 Sit to Stand Details: Verbal cues for precautions/safety Stand to Sit: 5: Supervision;Without upper extremity assist;To chair/3-in-1 Stand to Sit Details (indicate cue type and reason): Verbal cues for technique Stand to Sit Details: Needs cueing to sit in a controlled fashion.  Trunk/Postural Assessment  Cervical Assessment Cervical Assessment: Exceptions to [facility] Cervical AROM Overall Cervical AROM Comments: Pt with cervical flexion and protraction in standing. Thoracic Assessment Thoracic Assessment: Exceptions to [facility] Thoracic AROM Overall Thoracic AROM Comments: Thoracic kyphosis and rounding in standing.  Balance Balance Balance Assessed: Yes Static Standing Balance Static Standing - Balance Support: No upper extremity supported Static Standing - Level of Assistance: 5: Stand by assistance Dynamic Standing Balance Dynamic Standing - Balance Support: Right upper extremity supported;Left upper extremity supported Dynamic Standing - Level of Assistance: 5: Stand by assistance;Other (comment) (during mobility and selfcare with use of RW) Extremity/Trunk Assessment RUE AROM (degrees) RUE Overall AROM Comments: Shoulder flexion AROM 0-approximately 120 degrees, pt with history of right shoulder fracture in the past.  Strength  throughout 4/5 in the shoulder and elbow. LUE Assessment LUE Assessment: Within Functional Limits  See FIM for current functional status  [name] OTR/L Pager number [phone] 02/16/2012, 4:15 PM

## 2012-02-16 NOTE — Progress Notes (Signed)
Physical Therapy Session Note  Patient Details  Name: JOLEY UTECHT MRN: [id number] Date of Birth: [birth year]  Today's Date: 02/16/2012 Time:1145-1210 and  4098-1191 Time Calculation (min): 25 and 33 min  Short Term Goals: Week 1: = LTGs    Skilled Therapeutic Interventions/Progress Updates:   AM- Discussed safety issues regarding pt' unsafe use of 4WW walker yesterday.  Pt agreed to use 2 wheeled RW if that is our recommendation.  Pt's caregiver that has been helping at home for 4 hours, will be with her 9 hours per day after d/c.  Gait training with RW x 150' with supervision, VCs for upright posture, forward gaze, wider BOS.  Gait up/down 5 steps with 2 rails, supervision, step-through technique.  Pt initially tried to fit RW onto stairs; requiring 2 VCs to use 2 rails instead.  Gait up/down curb with RW, supervision and VCs for technique.  Reviewed [place] exercises, provided hand-out.    PM- neuromuscular re-education via forced use, demo, manual cues for: -dynamic standing:   -kicking a ball with alternating feet, RW.  Pt had LOB to R requiring max assist to prevent fall 1/10 attempts when kicking with LLE, SLS on RLE  -reaching out of BOS with R hand across body, contralateral stepping with LLE while swinging horseshoe with R, without RW, demonstrating improved postural control  -pelvic mobility, especially anterior pelvic tilt, reaching sitting on backless bench without armrests.  Pt with improving mobility and neutral tilt after 10 reps.  Pt reported that she has a Occupational psychologist at home.  Educated on core weakness due to using this, and importance of [place] exs to decrease fall risk.    Family ed planned for tomorrow, in afternoon, when dtr arrives.      Therapy Documentation Precautions:  Precautions Precautions: Fall Precaution Comments: hx multiple falls Restrictions Weight Bearing Restrictions: No   Pain: Pain Assessment Pain Assessment: No/denies pain     Locomotion : Ambulation Ambulation/Gait Assistance: 5: Supervision  Trunk/Postural Assessment : Postural Control Postural Control: Deficits on evaluation Righting Reactions: head  and trunk righting limited; pt with posterior bias in sitting and standing, and drifts L      See FIM for current functional status Therapy/Group: Individual Therapy  Thresia Ramanathan 02/16/2012, 3:07 PM

## 2012-02-16 NOTE — Progress Notes (Signed)
Social Work Patient ID: [name], female   DOB: 1919-02-26, 79 y.o.   MRN: [id number] Met with pt who reports they have someone to stay with her at home.  She is relieved about this.  She really wants to go home and not back to a facility. Have left message with daughter to confirm this.  Plan to discharge home tomorrow.  Will make discharge arrangements. Daughter did confirm discharge home. Pt wants to go home and daughter wants to abide her wishes.  Pt states: " Home is where I am happiest, I am old who knows how much longer I have."

## 2012-02-16 NOTE — Progress Notes (Signed)
Social Work Patient ID: [name], female   DOB: 11-30-1919, 80 y.o.   MRN: [id number] Met with pt to discuss team conference progression toward goals and discharge tomorrow.  She is pleased to be going home, she is aware of the team's concerns Regarding her increased risk to fall due to neuropathy and recommendation for 24 hr supervision at home.  She is hiring assist and has [employer] in place.  She is looking at quality of Life issues and wants to be home.  She has been to a NH on five occassions and feels she did some better but was the same as she is now.  Daughter is involved in this decision And will be here tomorrow at 1;30 pm for discharge and to do some family education.  Will make home health arrangements and DME for discharge.

## 2012-02-16 NOTE — Plan of Care (Signed)
Problem: RH Dressing Goal: LTG Patient will perform upper body dressing (OT) LTG Patient will perform upper body dressing with assist, with/without cues (OT).  Outcome: Not Met (add Reason) Pt needs min assist for her sports bra.

## 2012-02-16 NOTE — Progress Notes (Signed)
Occupational Therapy Session Note  Patient Details  Name: [name] MRN: [id number] Date of Birth: [birth year]  Today's Date: 02/16/2012 Time: 1191-4782 Time Calculation (min): 44 min  Skilled Therapeutic Interventions/Progress Updates:    Worked on right shoulder mobilizations and AAROM.  Pt in sidelying initially with therapist performing posterior inferior scapular mobilizations and gliding to help increase scapular ROM. Also performed glenohumeral mobilizations with emphasis on attempting to release posterior and anterior restrictions to allow for greater humeral head mobility during shoulder movement.  Transitioned to supine for further work on scapular movement/ gliding and also performed stretches to the posterior capsule.  Pt still with significant restrictions to the left shoulder secondary to previous shoulder/arm fracture.  AROM shoulder flexion 0-95 degrees, abduction also 0-90 degrees,  Pt with noted increased internal rotation when reaching behind her back with the right arm.    Therapy Documentation Precautions:  Precautions Precautions: Fall Precaution Comments: hx multiple falls Restrictions Weight Bearing Restrictions: No  Pain: Pain Assessment Pain Assessment: No/denies pain Pain Score:   2 Pain Type: Acute pain Pain Location: Shoulder Pain Orientation: Right Pain Intervention(s): Repositioned Multiple Pain Sites: No   See FIM for current functional status  Therapy/Group: Individual Therapy  Zanya Lindo OTR/L 02/16/2012, 3:45 PM

## 2012-02-17 ENCOUNTER — Ambulatory Visit (HOSPITAL_COMMUNITY): Payer: Medicare Other

## 2012-02-17 LAB — BASIC METABOLIC PANEL
Calcium: 9.3 mg/dL (ref 8.4–10.5)
GFR calc Af Amer: 31 mL/min — ABNORMAL LOW (ref >90)
GFR calc non Af Amer: 27 mL/min — ABNORMAL LOW (ref >90)
Glucose, Bld: 207 mg/dL — ABNORMAL HIGH (ref 70–99)
Potassium: 4.6 mEq/L (ref 3.5–5.1)
Sodium: 130 mEq/L — ABNORMAL LOW (ref 135–145)

## 2012-02-17 LAB — GLUCOSE, CAPILLARY: Glucose-Capillary: 195 mg/dL — ABNORMAL HIGH (ref 70–99)

## 2012-02-17 MED ORDER — INSULIN GLARGINE 100 UNIT/ML ~~LOC~~ SOLN: 28.0000 [IU] | Freq: Every day | SUBCUTANEOUS | Status: DC

## 2012-02-17 MED ORDER — CALCIUM POLYCARBOPHIL 625 MG PO TABS
1250.0000 mg | ORAL_TABLET | Freq: Every day | ORAL | Status: DC
  Administered 2012-02-17: 1250 mg via ORAL
  Filled 2012-02-17 (×2): qty 2

## 2012-02-17 MED ORDER — INSULIN ASPART 100 UNIT/ML ~~LOC~~ SOLN: 4.0000 [IU] | Freq: Three times a day (TID) | SUBCUTANEOUS | Status: DC

## 2012-02-17 MED ORDER — GABAPENTIN 300 MG PO CAPS: 300.0000 mg | ORAL_CAPSULE | Freq: Every day | ORAL | Status: DC

## 2012-02-17 MED ORDER — BENEPROTEIN PO POWD: 1.0000 | Freq: Three times a day (TID) | ORAL | Status: DC

## 2012-02-17 MED ORDER — CALCIUM POLYCARBOPHIL 625 MG PO TABS: 1250.0000 mg | ORAL_TABLET | Freq: Every day | ORAL | Status: DC

## 2012-02-17 NOTE — Progress Notes (Signed)
Pt discharged at  1450 with family to home. Discharge instructions given to pt and family by Marissa Nestle, PA with verbal understanding. Belongings with family. No further questions at this time.

## 2012-02-17 NOTE — Progress Notes (Signed)
Physical Therapy Discharge Summary  Patient Details  Name: [name] MRN: [id number] Date of Birth: 02-Dec-1919  Today's Date: 02/17/2012 Time: 8657-8469 Time Calculation (min): 15 min  Patient has met 8 of 8 long term goals due to improved activity tolerance, improved balance, improved postural control and increased strength.  Patient to discharge at an ambulatory level Supervision.   Patient's daughter demonstrated independence to provide the necessary supervision/cueing assistance at discharge. PT recommends supervision at discharge and for patient to use rolling walker instead of rollator. The rollator tends to make patient go too fast and is greater fall risk.   Reasons goals not met: n/a  Patient met goals  Recommendation:  Patient will benefit from ongoing skilled PT services in home health setting to continue to advance safe functional mobility, address ongoing impairments in generalized weakness, and minimize fall risk.  Equipment: No equipment provided Patient has rolling walker and rollator.   Reasons for discharge: treatment goals met and discharge from hospital  Patient/family agrees with progress made and goals achieved: Yes  PT Discharge Pain  Patient denies pain Mobility Bed Mobility Rolling Right: 6: Modified independent (Device/Increase time) Rolling Left: 6: Modified independent (Device/Increase time) Supine to Sit: 6: Modified independent (Device/Increase time) Sit to Supine: 6: Modified independent (Device/Increase time) Transfers Sit to Stand: 5: Supervision;With upper extremity assist;With armrests Stand to Sit: 5: Supervision;With armrests;With upper extremity assist Locomotion  Ambulation Ambulation/Gait Assistance: 5: Supervision Ambulation Distance (Feet): 150 Feet Assistive device: Rolling walker Gait Gait: Yes Gait Pattern: Step-through pattern;Decreased stride length;Narrow base of support;Trunk flexed;Right circumduction Stairs / Additional  Locomotion Stairs: Yes Stairs Assistance: 5: Supervision Stairs Assistance Details: Verbal cues for precautions/safety Stair Management Technique: Two rails;Alternating pattern;Forwards Number of Stairs: 6  Height of Stairs: 6    See FIM for current functional status 2:05 - 2:20 20 minutes session Individual session Treatment focused on family education with daughter. Patient and daughter told that recommendation is to use rolling walker as it is safer than rollator. Discussed staying active after discharge, walking frequently and doing HEP daily. Also discussed throw rugs and home safety especially with icy walkways today. Patient demonstrated car transfer and up and down 6 steps with bilateral rails and supervision/cueing. Patient and daughter report they have no further questions regarding discharge.    Arelia Longest M 02/17/2012, 3:05 PM

## 2012-02-17 NOTE — Progress Notes (Addendum)
Patient ID: [name], female   DOB: Apr 23, 1919, 79 y.o.   MRN: [id number] Subjective/Complaints: 79 y.o. female with history of DM, chronic diarrhes, gait disorder with multiple falls, who was admitted 02/03/12 with increasing weakness, speech difficulties with right sided weakness that started the day after Christmas. Patient with history of mild right sided weakness for approximately two months and waxing and waning of dysarthria. She was noted to have acute on chronic renal failure with BUN 38/Cr 1.92 and was started on IVF for hydration and diuretics discontinued. MRI/MRA brain without acute changes. Neurology consulted and MRI C-spine with multifactorial cervical stenosis from C2/C3 to C6/C7 with  Severe Left C3-4,4-5 , Right C4-5, C5-6  foraminal stenosis. Speech and strength have improved since admission. MBS done 1/11 due to history of prior dysphagia and patient placed on D3, thin liquids. UCS negative. Patient with history of chronic diarrhea--C-diff check 02/05/11 was negative.  Still with nocturia no improvement per pt Labs and vitals reviewed No c/o of weakness  Review of Systems  Constitutional: Positive for malaise/fatigue.  Gastrointestinal: Positive for diarrhea.       Chronic diarrhea for 70 years  All other systems reviewed and are negative.   Objective: Vital Signs: Blood pressure 153/71, pulse 61, temperature 98 F (36.7 C), temperature source Oral, resp. rate 20, height 5\' 8"  (1.727 m), weight 78.6 kg (173 lb 4.5 oz), SpO2 96.00%. No results found. Results for orders placed during the hospital encounter of 02/08/12 (from the past 72 hour(s))  GLUCOSE, CAPILLARY     Status: Abnormal   Collection Time   02/14/12 11:24 AM      Component Value Range Comment   Glucose-Capillary 205 (*) 70 - 99 mg/dL    Comment 1 Notify RN     GLUCOSE, CAPILLARY     Status: Abnormal   Collection Time   02/14/12  4:38 PM      Component Value Range Comment   Glucose-Capillary 69 (*) 70 -  99 mg/dL    Comment 1 Notify RN     GLUCOSE, CAPILLARY     Status: Abnormal   Collection Time   02/14/12  4:57 PM      Component Value Range Comment   Glucose-Capillary 114 (*) 70 - 99 mg/dL   GLUCOSE, CAPILLARY     Status: Abnormal   Collection Time   02/14/12  8:08 PM      Component Value Range Comment   Glucose-Capillary 226 (*) 70 - 99 mg/dL   CREATININE, SERUM     Status: Abnormal   Collection Time   02/15/12  7:05 AM      Component Value Range Comment   Creatinine, Ser 1.68 (*) 0.50 - 1.10 mg/dL    GFR calc non Af Amer 26 (*) >90 mL/min    GFR calc Af Amer 30 (*) >90 mL/min   GLUCOSE, CAPILLARY     Status: Abnormal   Collection Time   02/15/12  7:19 AM      Component Value Range Comment   Glucose-Capillary 113 (*) 70 - 99 mg/dL   GLUCOSE, CAPILLARY     Status: Abnormal   Collection Time   02/15/12 12:48 PM      Component Value Range Comment   Glucose-Capillary 185 (*) 70 - 99 mg/dL    Comment 1 Notify RN     GLUCOSE, CAPILLARY     Status: Abnormal   Collection Time   02/15/12  4:38 PM      Component  Value Range Comment   Glucose-Capillary 133 (*) 70 - 99 mg/dL    Comment 1 Notify RN     GLUCOSE, CAPILLARY     Status: Abnormal   Collection Time   02/15/12  8:26 PM      Component Value Range Comment   Glucose-Capillary 204 (*) 70 - 99 mg/dL    Comment 1 Notify RN     GLUCOSE, CAPILLARY     Status: Abnormal   Collection Time   02/16/12  7:13 AM      Component Value Range Comment   Glucose-Capillary 121 (*) 70 - 99 mg/dL    Comment 1 Notify RN     BASIC METABOLIC PANEL     Status: Abnormal   Collection Time   02/16/12  8:32 AM      Component Value Range Comment   Sodium 134 (*) 135 - 145 mEq/L    Potassium 4.5  3.5 - 5.1 mEq/L    Chloride 96  96 - 112 mEq/L    CO2 26  19 - 32 mEq/L    Glucose, Bld 182 (*) 70 - 99 mg/dL    BUN 38 (*) 6 - 23 mg/dL    Creatinine, Ser 1.61 (*) 0.50 - 1.10 mg/dL    Calcium 9.6  8.4 - 09.6 mg/dL    GFR calc non Af Amer 27 (*) >90 mL/min     GFR calc Af Amer 31 (*) >90 mL/min   GLUCOSE, CAPILLARY     Status: Abnormal   Collection Time   02/16/12 11:48 AM      Component Value Range Comment   Glucose-Capillary 138 (*) 70 - 99 mg/dL    Comment 1 Notify RN     GLUCOSE, CAPILLARY     Status: Abnormal   Collection Time   02/16/12  4:52 PM      Component Value Range Comment   Glucose-Capillary 146 (*) 70 - 99 mg/dL   GLUCOSE, CAPILLARY     Status: Abnormal   Collection Time   02/16/12  8:16 PM      Component Value Range Comment   Glucose-Capillary 169 (*) 70 - 99 mg/dL   BASIC METABOLIC PANEL     Status: Abnormal   Collection Time   02/17/12  6:00 AM      Component Value Range Comment   Sodium 130 (*) 135 - 145 mEq/L    Potassium 4.6  3.5 - 5.1 mEq/L    Chloride 94 (*) 96 - 112 mEq/L    CO2 24  19 - 32 mEq/L    Glucose, Bld 207 (*) 70 - 99 mg/dL    BUN 40 (*) 6 - 23 mg/dL    Creatinine, Ser 0.45 (*) 0.50 - 1.10 mg/dL    Calcium 9.3  8.4 - 40.9 mg/dL    GFR calc non Af Amer 27 (*) >90 mL/min    GFR calc Af Amer 31 (*) >90 mL/min   GLUCOSE, CAPILLARY     Status: Abnormal   Collection Time   02/17/12  7:13 AM      Component Value Range Comment   Glucose-Capillary 195 (*) 70 - 99 mg/dL    Comment 1 Notify RN        HEENT: normal Cardio: RRR Resp: CTA B/L GI: BS negative and Distention Extremity:  No Edema Skin:   Intact Neuro: Alert/Oriented, Abnormal Sensory reduced in feet, Normal Motor and Dysarthric, 5/5 in BUE, 4/5 in B quads, 3-/5 ankle DF/PF and  toes flexors and extensors Musc/Skel:  Normal Gen NAD Mood/affect appropriate No evidence of tremor or cogwheel rigidity,+ masked facies Assessment/Plan: 1. Functional deficits secondary to deconditioning after dehydration in a pt with chronic multifactorial gait disorder associated with diabetic peripheral neuropathy stable for D/C   FIM: FIM - Bathing Bathing Steps Patient Completed: Chest;Right Arm;Left Arm;Abdomen;Front perineal area;Buttocks;Right upper  leg;Right lower leg (including foot);Left upper leg;Left lower leg (including foot) Bathing: 5: Supervision: Safety issues/verbal cues  FIM - Upper Body Dressing/Undressing Upper body dressing/undressing steps patient completed: Thread/unthread right bra strap;Thread/unthread left bra strap;Thread/unthread right sleeve of pullover shirt/dresss;Thread/unthread left sleeve of pullover shirt/dress;Put head through opening of pull over shirt/dress;Pull shirt over trunk Upper body dressing/undressing: 5: Supervision: Safety issues/verbal cues FIM - Lower Body Dressing/Undressing Lower body dressing/undressing steps patient completed: Thread/unthread right pants leg;Thread/unthread left pants leg;Pull pants up/down;Don/Doff right sock;Don/Doff left sock;Don/Doff right shoe;Don/Doff left shoe;Fasten/unfasten right shoe;Fasten/unfasten left shoe Lower body dressing/undressing: 5: Supervision: Safety issues/verbal cues  FIM - Toileting Toileting steps completed by patient: Adjust clothing prior to toileting Toileting Assistive Devices: Grab bar or rail for support Toileting: 5: Supervision: Safety issues/verbal cues  FIM - Diplomatic Services operational officer Devices: Building control surveyor Transfers: 5-To toilet/BSC: Supervision (verbal cues/safety issues);5-From toilet/BSC: Supervision (verbal cues/safety issues)  FIM - Bed/Chair Transfer Bed/Chair Transfer Assistive Devices: Therapist, occupational: 5: Supine > Sit: Supervision (verbal cues/safety issues)  FIM - Locomotion: Wheelchair Distance: 150 Locomotion: Wheelchair: 1: Total Assistance/staff pushes wheelchair (Pt<25%) FIM - Locomotion: Ambulation Locomotion: Ambulation Assistive Devices: Designer, industrial/product Ambulation/Gait Assistance: 5: Supervision Locomotion: Ambulation: 5: Travels 150 ft or more with supervision/safety issues  Comprehension Comprehension Mode: Auditory Comprehension: 5-Understands complex 90% of the  time/Cues < 10% of the time  Expression Expression Mode: Verbal Expression: 5-Expresses complex 90% of the time/cues < 10% of the time  Social Interaction Social Interaction Mode: Asleep Social Interaction: 5-Interacts appropriately 90% of the time - Needs monitoring or encouragement for participation or interaction.  Problem Solving Problem Solving Mode: Asleep Problem Solving: 5-Solves basic 90% of the time/requires cueing < 10% of the time  Memory Memory Mode: Asleep Memory: 5-Recognizes or recalls 90% of the time/requires cueing < 10% of the time  Medical Problem List and Plan:  1. DVT Prophylaxis/Anticoagulation: Pharmaceutical: Lovenox  2. Diabetic peripheral neuropathy: Continue neurontin 300 mg bid. May need dose adjusted if renal functions declines off IVF.  3. Mood: Seems motivated and upbeat. Will monitor for now.  4. Neuropsych: This patient is capable of making decisions on his/her own behalf.  5. HTN: monitor with bid checks.  6. DM type 2 with poor control: Monitor with AC/HS CBG checks. adjust lantus with meal coverage.  7. Chronic diastolic CHF: Check daily weights. Renal status much improved and IVF discontinued today. salt restrictions.  8. Diabetic foot ulcers: Continue foam dressing. Will add PRAFOs.. Shoes when OOB and pressure relief measures. Wounds are likley affecting balance as well 9.  Spastic bladder trial oxybutnin at noc, monitor for retention, still c/o nocturia will increase dose 10.  Probable spastic bowel hesistate increasing antispasmodic due to potential cognitive SE 11.  Chronic renal failure Baseline GFR ~30 LOS (Days) 9 A FACE TO FACE EVALUATION WAS PERFORMED  KIRSTEINS,ANDREW E 02/17/2012, 8:35 AM

## 2012-02-17 NOTE — Progress Notes (Signed)
Social Work Discharge Note Discharge Note  The overall goal for the admission was met for:   Discharge location: Yes-HOME WITH HIRED ASSISTANCE  Length of Stay: Yes-9 DAYS  Discharge activity level: Yes-SUPERVISION LEVEL  Home/community participation: Yes  Services provided included: MD, RD, PT, OT, RN, Pharmacy and SW  Financial Services: Medicare and Private Insurance: [employer]  Follow-up services arranged: Home Health: [facility]-PT, OT, RN, DME: [facility] and Patient/Family has no preference for HH/DME agencies  Comments (or additional information):FAMILY EDUCATION COMPLETED. AWARE TEAM RECOMMENDS 24 HOUR SUPERVISION DUE TO HIGH RISK TO FALL PT HAS LIFELINE IN PLACE  Patient/Family verbalized understanding of follow-up arrangements: Yes  Individual responsible for coordination of the follow-up plan: BEVERLY-DAUGHTER  Confirmed correct DME delivered: Lucy Chris 02/17/2012    Lucy Chris

## 2012-02-18 NOTE — Progress Notes (Signed)
Discharge summary # [phone]

## 2012-02-19 NOTE — Discharge Summary (Signed)
NAMEKENNETHA, PEARMAN NO.:  [id number]  MEDICAL RECORD NO.:  [id number]  LOCATION:  4145                         FACILITY:  [facility]  PHYSICIAN:  Erick Colace, M.D.DATE OF BIRTH:  02-01-1919  DATE OF ADMISSION:  02/08/2012 DATE OF DISCHARGE:  02/18/2012                              DISCHARGE SUMMARY   DISCHARGE DIAGNOSES: 1. Multifactorial chronic right-sided weakness and gait disorder. 2. Diabetes mellitus with diabetic neuropathy. 3. Diabetes mellitus type 2. 4. Diabetic foot ulcers. 5. Hypertension. 6. Acute on chronic renal failure, improved. 7. Chronic diastolic congestive heart failure, compensated.  HISTORY OF PRESENT ILLNESS:  Ms. Eilidh Marcano is a 79 year old female with history of diabetes mellitus and gait disorder with multiple falls. She was admitted on February 03, 2012, with increased weakness, speech difficulties, as well as right-sided weakness that had persisted since the day after Christmas.  The patient has history of waxing and waning of dysarthria as well as right-sided weakness.  She was noted to have acute on chronic renal failure with BUN at 38, creatinine 1.92, and was started on IV fluids for hydration and her diuretics were discontinued. MRI and MRA of brain done showed no acute changes.  MRI of C-spine showed multifactorial cervical stenosis.  The patient's speech and strength has improved since admission.  MBS was done due to the patient's prior history of dysphagia and the patient was placed on D 3 diet with thin liquids.  Stools were checked for C. diff and were negative.  Neurology recommended aspirin and ongoing therapy for weakness of questionable etiology.  Therapies were initiated and CIR level therapies were recommended for progression.  The patient was therefore admitted to Rehab on February 08, 2012.  PAST MEDICAL HISTORY:  History of coronary artery disease with MI, hypertension, peripheral vascular disease,  diabetes mellitus, presbyesophagus, history of urinary retention with incomplete bladder emptying, chronic diastolic CHF.  FUNCTIONAL HISTORY:  The patient was independent with rolling walker prior to admission.  She has a personal care attendant 4 hours a day and has a daughter, who helps with meal preparation.  FUNCTIONAL STATUS:  The patient is min assist for transfers, min assist for ambulating 80 feet with rolling walker with tendency to fatigue quickly as well as difficulty with the heel strike and swing-through with fatigue.  She required min assist for upper body bathing and dressing tasks, max assist for lower body bathing and dressing.  Min assist for toileting transfers.  Required max cues for safety and hand placement for safe rolling walker use.  LABORATORY DATA:  Check of lytes from February 17, 2012, revealed sodium 130, potassium 4.6, chloride 94, CO2 24, BUN 40, creatinine 1.62 glucose 207.  CBC at admission revealed hemoglobin 11.7, hematocrit 35.4, white count 8.4, platelets 247.  HOSPITAL COURSE:  Ms. Kiaira Pointer was admitted to Rehab on February 08, 2012, for inpatient therapies to consist of PT, OT, and speech therapy at least 3 hours 5 days a week.  Past admission, physiatrist, rehab, RN, and therapy team have worked together to provide customized collaborative interdisciplinary care.  Rehab RN has worked the patient on bowel and bladder program as well as scheduled toileting program  to help with continence.  The patient's blood pressures were monitored on b.i.d. basis and these were noted to be reasonably controlled.  The patient reported problems with urgency, frequency, and incontinence. She was started on Ditropan to help with her symptoms.  She reported minimal improvement with this, therefore, this was discontinued at time of discharge.  The patient's blood sugars have been monitored on before meal and at bedtime basis.  She was noted to have some issues  with hypoglycemia in the evenings, therefore, her lunchtime meal coverage was adjusted.   Blood sugars at time of discharge were running from 140s to 190s range.  The patient was advised to continue checking blood sugars on b.i.d. to t.i.d. basis and follow up with her MD for adjustment of her diabetes regimen.  Her diabetic foot ulcers were noted to be well healed.  She does continue with blister on her right fourth toe that was treated with Silvadene cream and Band-Aid. Daily weights were checked with monitoring for evidence of fluid overload.  Weight at time of discharge was at 78.6 kg.  No signs or symptoms of overload noted.  Routine check of labs were done during this Stay. IV fluids had been discontinued at admission.  Her renal status at time of discharge showed BUN and creatinine up to 40 and 1.62, which was close to her baseline of chronic kidney disease.   During the patient's stay in rehab, team conferences were held to monitor the patient's progress, set goals, as well as discussed barriers to discharge.  OT has worked with the patient on ADL tasks.  The patient was showing improvement in activity tolerance as well as coordination.  The patient was at supervision level for bathing and dressing tasks.  She did require min assist to help don her sports bra.  Physical Therapy has worked with the patient on balance, strengthening, as well as activity tolerance.  The patient was at modified independent level for bed mobility, supervision for transfers, supervision for ambulating 150 feet with rolling walker.  Family education was done with daughter with recommendations to use rolling walker as it was safer than the rollator. Home exercise program, including walking frequently and staying active to help maintain current functional status was recommended.  The patient and family were educated on providing supervision for car transfers and Stair navigation. Due to the patient's  history of falls as well as gait disorder, 24-hour supervision is recommended past discharge.  On February 17, 2012, the patient is discharged to home.  DISCHARGE MEDICATIONS:  FiberCon 2 tabs p.o. per day, protein supplements t.i.d., Neurontin 300 mg p.o. per day, NovoLog 6 units with breakfast and supper, 4 units with lunch; Lantus insulin 28 units at bedtime; aspirin 325 mg a day; Os-Cal 1 p.o. per day; Coreg 6.25 mg b.i.d.; Cardizem CD 180 mg p.o. per day; Imdur 30 mg p.o. per day, Synthroid 225 mcg p.o. per day; multivitamin 1 per day; Protonix 40 mg a day; Flomax 0.4 mg p.o. per day; and Neosporin or silver cream to right fourth toe daily.  DIET:  Carb modified medium with low salt restrictions.  ACTIVITY LEVEL:  As tolerated with supervision.  WOUND CARE:  Keep wound clean and dry.  Wear protective shoes when out of bed.  SPECIAL INSTRUCTIONS:  Drink plenty of fluids.   [facility] to provide PT, OT, and RN.  FOLLOWUP:  The patient to follow up with Dr. [name] on February 23, 2012, at 2:30.  Follow  up with Dr. Thana Farr as needed.  Follow up with Dr. Claudette Laws on February 29, 2012, at 10 a.m.     Delle Reining, P.A.   ______________________________ Erick Colace, M.D.    PL/MEDQ  D:  02/18/2012  T:  02/19/2012  Job:  [id number]  cc:   Massie Maroon, MD

## 2012-02-29 ENCOUNTER — Inpatient Hospital Stay: Payer: Medicare Other | Admitting: Physical Medicine & Rehabilitation

## 2012-02-29 ENCOUNTER — Encounter: Payer: Medicare Other | Attending: Physical Medicine & Rehabilitation

## 2012-04-11 ENCOUNTER — Inpatient Hospital Stay (HOSPITAL_COMMUNITY)
Admission: EM | Admit: 2012-04-11 | Discharge: 2012-04-17 | DRG: 617 | Disposition: A | Discharge status: Skilled Nursing Facility | Payer: Medicare Other | Attending: Internal Medicine | Admitting: Internal Medicine

## 2012-04-11 ENCOUNTER — Encounter (HOSPITAL_COMMUNITY): Payer: Self-pay | Admitting: Emergency Medicine

## 2012-04-11 ENCOUNTER — Emergency Department (HOSPITAL_COMMUNITY): Payer: Medicare Other

## 2012-04-11 DIAGNOSIS — R471 Dysarthria and anarthria: Secondary | ICD-10-CM | POA: Diagnosis present

## 2012-04-11 DIAGNOSIS — E1142 Type 2 diabetes mellitus with diabetic polyneuropathy: Secondary | ICD-10-CM | POA: Diagnosis present

## 2012-04-11 DIAGNOSIS — I509 Heart failure, unspecified: Secondary | ICD-10-CM | POA: Diagnosis present

## 2012-04-11 DIAGNOSIS — M86179 Other acute osteomyelitis, unspecified ankle and foot: Secondary | ICD-10-CM

## 2012-04-11 DIAGNOSIS — E1169 Type 2 diabetes mellitus with other specified complication: Principal | ICD-10-CM | POA: Diagnosis present

## 2012-04-11 DIAGNOSIS — N179 Acute kidney failure, unspecified: Secondary | ICD-10-CM | POA: Diagnosis present

## 2012-04-11 DIAGNOSIS — Z791 Long term (current) use of non-steroidal anti-inflammatories (NSAID): Secondary | ICD-10-CM

## 2012-04-11 DIAGNOSIS — I5032 Chronic diastolic (congestive) heart failure: Secondary | ICD-10-CM | POA: Diagnosis present

## 2012-04-11 DIAGNOSIS — N183 Chronic kidney disease, stage 3 unspecified: Secondary | ICD-10-CM | POA: Diagnosis present

## 2012-04-11 DIAGNOSIS — A4902 Methicillin resistant Staphylococcus aureus infection, unspecified site: Secondary | ICD-10-CM | POA: Diagnosis present

## 2012-04-11 DIAGNOSIS — L539 Erythematous condition, unspecified: Secondary | ICD-10-CM | POA: Diagnosis present

## 2012-04-11 DIAGNOSIS — R2689 Other abnormalities of gait and mobility: Secondary | ICD-10-CM

## 2012-04-11 DIAGNOSIS — L02419 Cutaneous abscess of limb, unspecified: Secondary | ICD-10-CM | POA: Diagnosis present

## 2012-04-11 DIAGNOSIS — M86171 Other acute osteomyelitis, right ankle and foot: Secondary | ICD-10-CM

## 2012-04-11 DIAGNOSIS — L97509 Non-pressure chronic ulcer of other part of unspecified foot with unspecified severity: Secondary | ICD-10-CM | POA: Diagnosis present

## 2012-04-11 DIAGNOSIS — M9080 Osteopathy in diseases classified elsewhere, unspecified site: Secondary | ICD-10-CM | POA: Diagnosis present

## 2012-04-11 DIAGNOSIS — Z79899 Other long term (current) drug therapy: Secondary | ICD-10-CM

## 2012-04-11 DIAGNOSIS — N189 Chronic kidney disease, unspecified: Secondary | ICD-10-CM

## 2012-04-11 DIAGNOSIS — N390 Urinary tract infection, site not specified: Secondary | ICD-10-CM

## 2012-04-11 DIAGNOSIS — M19079 Primary osteoarthritis, unspecified ankle and foot: Secondary | ICD-10-CM | POA: Diagnosis present

## 2012-04-11 DIAGNOSIS — E1149 Type 2 diabetes mellitus with other diabetic neurological complication: Secondary | ICD-10-CM | POA: Diagnosis present

## 2012-04-11 DIAGNOSIS — N184 Chronic kidney disease, stage 4 (severe): Secondary | ICD-10-CM

## 2012-04-11 DIAGNOSIS — I10 Essential (primary) hypertension: Secondary | ICD-10-CM

## 2012-04-11 DIAGNOSIS — E1165 Type 2 diabetes mellitus with hyperglycemia: Principal | ICD-10-CM | POA: Diagnosis present

## 2012-04-11 DIAGNOSIS — M869 Osteomyelitis, unspecified: Secondary | ICD-10-CM

## 2012-04-11 DIAGNOSIS — IMO0002 Reserved for concepts with insufficient information to code with codable children: Principal | ICD-10-CM | POA: Diagnosis present

## 2012-04-11 DIAGNOSIS — Z87891 Personal history of nicotine dependence: Secondary | ICD-10-CM

## 2012-04-11 DIAGNOSIS — E039 Hypothyroidism, unspecified: Secondary | ICD-10-CM | POA: Diagnosis present

## 2012-04-11 DIAGNOSIS — I129 Hypertensive chronic kidney disease with stage 1 through stage 4 chronic kidney disease, or unspecified chronic kidney disease: Secondary | ICD-10-CM | POA: Diagnosis present

## 2012-04-11 DIAGNOSIS — R197 Diarrhea, unspecified: Secondary | ICD-10-CM

## 2012-04-11 DIAGNOSIS — R5381 Other malaise: Secondary | ICD-10-CM | POA: Diagnosis present

## 2012-04-11 DIAGNOSIS — I2510 Atherosclerotic heart disease of native coronary artery without angina pectoris: Secondary | ICD-10-CM | POA: Diagnosis present

## 2012-04-11 DIAGNOSIS — E860 Dehydration: Secondary | ICD-10-CM

## 2012-04-11 DIAGNOSIS — IMO0001 Reserved for inherently not codable concepts without codable children: Secondary | ICD-10-CM | POA: Diagnosis present

## 2012-04-11 DIAGNOSIS — I252 Old myocardial infarction: Secondary | ICD-10-CM

## 2012-04-11 DIAGNOSIS — E11628 Type 2 diabetes mellitus with other skin complications: Secondary | ICD-10-CM

## 2012-04-11 DIAGNOSIS — Z794 Long term (current) use of insulin: Secondary | ICD-10-CM

## 2012-04-11 DIAGNOSIS — I739 Peripheral vascular disease, unspecified: Secondary | ICD-10-CM | POA: Diagnosis present

## 2012-04-11 DIAGNOSIS — Z7982 Long term (current) use of aspirin: Secondary | ICD-10-CM

## 2012-04-11 DIAGNOSIS — A0472 Enterocolitis due to Clostridium difficile, not specified as recurrent: Secondary | ICD-10-CM

## 2012-04-11 DIAGNOSIS — L089 Local infection of the skin and subcutaneous tissue, unspecified: Secondary | ICD-10-CM

## 2012-04-11 LAB — CBC
HCT: 38.7 % (ref 36.0–46.0)
MCH: 27.6 pg (ref 26.0–34.0)
MCV: 86.2 fL (ref 78.0–100.0)
RDW: 13 % (ref 11.5–15.5)
WBC: 16.6 10*3/uL — ABNORMAL HIGH (ref 4.0–10.5)

## 2012-04-11 LAB — BASIC METABOLIC PANEL
BUN: 23 mg/dL (ref 6–23)
Calcium: 9.3 mg/dL (ref 8.4–10.5)
Chloride: 96 mEq/L (ref 96–112)
Creatinine, Ser: 1.6 mg/dL — ABNORMAL HIGH (ref 0.50–1.10)
GFR calc Af Amer: 32 mL/min — ABNORMAL LOW (ref >90)

## 2012-04-11 MED ORDER — VANCOMYCIN HCL 1000 MG IV SOLR
750.0000 mg | INTRAVENOUS | Status: DC
  Administered 2012-04-11 – 2012-04-12 (×2): 750 mg via INTRAVENOUS
  Filled 2012-04-11 (×2): qty 750

## 2012-04-11 MED ORDER — CLINDAMYCIN HCL 300 MG PO CAPS
300.0000 mg | ORAL_CAPSULE | Freq: Once | ORAL | Status: AC
  Administered 2012-04-11: 300 mg via ORAL
  Filled 2012-04-11: qty 1

## 2012-04-11 NOTE — ED Notes (Signed)
XBM:WU13<KG> Expected date:<BR> Expected time:<BR> Means of arrival:<BR> Comments:<BR> Pinky toe turning black

## 2012-04-11 NOTE — Progress Notes (Signed)
ANTIBIOTIC CONSULT NOTE - INITIAL  Pharmacy Consult for Vanco Indication: Osteo  Allergies  Allergen Reactions  . Ephedrine   . Morphine Other (See Comments)    Difficulty waking up & talking after surgery  . Rocephin (Ceftriaxone Sodium In Dextrose)     Developed acute resp distress, wheezing after 2nd dose.   Vital Signs: Temp: 97.9 F (36.6 C) (03/18 1613) Temp src: Oral (03/18 1613) BP: 144/70 mmHg (03/18 1613) Pulse Rate: 76 (03/18 1613)  Labs:  Recent Labs  04/11/12 1755  WBC 16.6*  HGB 12.4  PLT 277  CREATININE 1.60*   Medical History: Past Medical History  Diagnosis Date  . Myocardial infarction   . Coronary artery disease   . Hypertension   . Dysrhythmia   . Peripheral vascular disease   . Hypothyroidism   . Diabetes mellitus   . Presbyesophagus 04/23/2011  . Urinary retention with incomplete bladder emptying 04/23/2011  . UTI (lower urinary tract infection) 04/21/2011  . Diastolic CHF, chronic     Grade I diastolic dysfunction on Echo 04/26/11  . Anaphylaxis 04/25/2011    Thought to be from Rocephin    Medications:  Anti-infectives   Start     Dose/Rate Route Frequency Ordered Stop   04/11/12 1730  clindamycin (CLEOCIN) capsule 300 mg     300 mg Oral  Once 04/11/12 1729 04/11/12 1738     Assessment: 79 yo F presents to ER 04/11/12 with right 4th toe which is black with brownish discharge. Pt has hx of DM. Plan now is to start Vanco per Rx for osteo. SCr=1.6, wt 78.6kg (Jan 2013), CrCl(n) ~ 68ml/min.  Goal of Therapy:  Vancomycin trough level 15-20 mcg/ml  Plan:  1) Vanco 750mg  IV q24h 2) F/U updated ht/wt info vs dose  Annia Belt 04/11/2012,7:03 PM

## 2012-04-11 NOTE — ED Notes (Signed)
Attempted to call report to 5E - RN is getting report beginning his shift and requesting this RN to call again in to give report.

## 2012-04-11 NOTE — ED Provider Notes (Signed)
History     CSN: [id number]  Arrival date & time 04/11/12  1606   First MD Initiated Contact with Patient 04/11/12 1646      Chief Complaint  Patient presents with  . Skin Ulcer    (Consider location/radiation/quality/duration/timing/severity/associated sxs/prior treatment) HPI The patient presents with a wound on her right fourth toe.  The wound began approximately 2 weeks ago, and since onset has become more pronounced.  There is focal skin change, with increasing size, and new drainage.  There is no associated vomiting, fever, dyspnea, belly pain, weakness.  The patient has a long history of diabetes, has distal extremity neuropathy. Home care today advised ED evaluation due to the chronicity of the wound.    Past Medical History  Diagnosis Date  . Myocardial infarction   . Coronary artery disease   . Hypertension   . Dysrhythmia   . Peripheral vascular disease   . Hypothyroidism   . Diabetes mellitus   . Presbyesophagus 04/23/2011  . Urinary retention with incomplete bladder emptying 04/23/2011  . UTI (lower urinary tract infection) 04/21/2011  . Diastolic CHF, chronic     Grade I diastolic dysfunction on Echo 04/26/11  . Anaphylaxis 04/25/2011    Thought to be from Rocephin    Past Surgical History  Procedure Laterality Date  . Cholecystectomy    . Colonoscopy  01/15/2011    Procedure: COLONOSCOPY;  Surgeon: Theda Belfast, MD;  Location: [facility];  Service: Endoscopy;  Laterality: N/A;    Family History  Problem Relation Age of Onset  . Cancer Sister   . Diabetes Mother   . Heart disease Father     History  Substance Use Topics  . Smoking status: Former Smoker    Types: Cigarettes    Quit date: 04/20/1981  . Smokeless tobacco: Never Used  . Alcohol Use: Yes     Comment: social    OB History   Grav Para Term Preterm Abortions TAB SAB Ect Mult Living                  Review of Systems  Constitutional:       Per HPI, otherwise negative  HENT:         Per HPI, otherwise negative  Respiratory:       Per HPI, otherwise negative  Cardiovascular:       Per HPI, otherwise negative  Gastrointestinal: Negative for vomiting.  Endocrine:       Negative aside from HPI  Genitourinary:       Neg aside from HPI   Musculoskeletal:       Per HPI, otherwise negative  Skin: Positive for wound. Negative for pallor.  Neurological: Negative for syncope.    Allergies  Ephedrine; Morphine; and Rocephin  Home Medications   Current Outpatient Rx  Name  Route  Sig  Dispense  Refill  . aspirin EC 325 MG tablet   Oral   Take 1 tablet (325 mg total) by mouth daily.         . calcium carbonate (OS-CAL - DOSED IN MG OF ELEMENTAL CALCIUM) 1250 MG tablet   Oral   Take 1 tablet by mouth daily.         . carvedilol (COREG) 6.25 MG tablet   Oral   Take 6.25 mg by mouth 2 (two) times daily with a meal.           . diltiazem (CARDIZEM CD) 180 MG 24 hr capsule  Oral   Take 180 mg by mouth at bedtime.          . gabapentin (NEURONTIN) 300 MG capsule   Oral   Take 1 capsule (300 mg total) by mouth at bedtime.         . insulin aspart (NOVOLOG) 100 UNIT/ML injection   Subcutaneous   Inject 4-6 Units into the skin 3 (three) times daily with meals. Use 6 units with breakfast and supper.  Use 4 units with lunch.   1 vial      . insulin glargine (LANTUS) 100 UNIT/ML injection   Subcutaneous   Inject 28 Units into the skin at bedtime.   10 mL      . isosorbide mononitrate (IMDUR) 30 MG 24 hr tablet   Oral   Take 30 mg by mouth daily.           Marland Kitchen levothyroxine (SYNTHROID, LEVOTHROID) 200 MCG tablet   Oral   Take 200 mcg by mouth every morning.          . neomycin-bacitracin-polymyxin (NEOSPORIN) ointment      Apply topically daily. Apply to right foot callous on plantar surface and to upper toe callous Q day.  Leave open to air or cover with band aid if desired.         . pantoprazole (PROTONIX) 40 MG tablet   Oral    Take 1 tablet (40 mg total) by mouth daily.   30 tablet   0   . polycarbophil (FIBERCON) 625 MG tablet   Oral   Take 2 tablets (1,250 mg total) by mouth daily. To help with formed stools.         . protein supplement (RESOURCE BENEPROTEIN) POWD   Oral   Take 6 g by mouth 3 (three) times daily with meals.         . Tamsulosin HCl (FLOMAX) 0.4 MG CAPS   Oral   Take 0.4 mg by mouth every morning.          . Multiple Vitamin (MULITIVITAMIN WITH MINERALS) TABS   Oral   Take 1 tablet by mouth daily.           BP 144/70  Pulse 76  Temp(Src) 97.9 F (36.6 C) (Oral)  Resp 18  SpO2 95%  Physical Exam  Nursing note and vitals reviewed. Constitutional: She is oriented to person, place, and time. She appears well-developed and well-nourished. No distress.  HENT:  Head: Normocephalic and atraumatic.  Eyes: Conjunctivae and EOM are normal.  Cardiovascular: Normal rate and regular rhythm.   Murmur heard. Pulmonary/Chest: Effort normal and breath sounds normal. No stridor. No respiratory distress.  Abdominal: She exhibits no distension.  Musculoskeletal: She exhibits no edema.  Neurological: She is alert and oriented to person, place, and time. No cranial nerve deficit.  Skin:     Psychiatric: She has a normal mood and affect.    ED Course  Procedures (including critical care time)  O2- 99%ra, normal  Labs Reviewed  CBC  BASIC METABOLIC PANEL   No results found.   No diagnosis found.  INCISION AND DRAINAGE Performed by: Gerhard Munch Consent: Verbal consent obtained. Risks and benefits: risks, benefits and alternatives were discussed Type: abscess  Body area: R fourth toe  Anesthesia: local infiltration  Incision was made with a scalpel (15 blade).  Local anesthetic: lidocaine 1%   Anesthetic total: 2 ml  Complexity : mild - surrounding inflammation and overlying indurated skin  Drainage: purulent  Drainage amount: 2ml  Culture  sent   Patient tolerance: Patient tolerated the procedure well with no immediate complications.    MDM  This pleasant elderly female with neuropathy presents with progression of a toe lesion.  Given the appearance, the neuropathy, there is some concern for osteomyelitis, the patient is afebrile.  The patient's labs are notable for leukocytosis, and x-ray is concerning for possible osteomyelitis.  Following incision, drainage, culture obtaining, the patient was admitted following initial antibiotic provision.    Gerhard Munch, MD 04/11/12 2127

## 2012-04-11 NOTE — ED Notes (Addendum)
Patient from home c/o right fourth toe wound which is painful and black.  Draining brownish discharge. Patient reports she has had wound for several months.  Patient is diabetic. CBG-244.  Pt has home health nurse for dressing changes.

## 2012-04-11 NOTE — H&P (Signed)
History and Physical  GENEVEIVE FURNESS WJX:[id number] DOB: [birth year] DOA: 04/11/2012  Referring physician: ER  PCP: [name], MD   Chief Complaint: Skin Ulcer  HPI: Ms. Kennith Center is a 79 year old female with past medical history most significant for uncontrolled diabetes was a patient of [name] and has been having difficulty controlling her blood sugars over last few months. Patient has had this skin breakdown on her fourth toe of the right leg since last one month. If his court and worse over the last one month to a point where since last 2 days it is red, swollen and draining blood and pus. There is focal skin change, with increasing size, and new drainage.The patient has a long history of diabetes, has distal extremity neuropathy. She denies any pain in her right foot.  Patient denies any nausea, vomiting, fever, chills, chest pain, shortness of breath, vomiting, abdominal pain, change in urinary habits, change in dietary habits.   Review of Systems:  15 point review of system is negative except what is noted above.  Past Medical History  Diagnosis Date  . Myocardial infarction   . Coronary artery disease   . Hypertension   . Dysrhythmia   . Peripheral vascular disease   . Hypothyroidism   . Diabetes mellitus   . Presbyesophagus 04/23/2011  . Urinary retention with incomplete bladder emptying 04/23/2011  . UTI (lower urinary tract infection) 04/21/2011  . Diastolic CHF, chronic     Grade I diastolic dysfunction on Echo 04/26/11  . Anaphylaxis 04/25/2011    Thought to be from Rocephin    Past Surgical History  Procedure Laterality Date  . Cholecystectomy    . Colonoscopy  01/15/2011    Procedure: COLONOSCOPY;  Surgeon: Theda Belfast, MD;  Location: [facility];  Service: Endoscopy;  Laterality: N/A;    Social History:  reports that she quit smoking about 30 years ago. Her smoking use included Cigarettes. She smoked 0.00 packs per day. She has never used smokeless tobacco. She  reports that  drinks alcohol. She reports that she does not use illicit drugs.  Allergies  Allergen Reactions  . Ephedrine   . Morphine Other (See Comments)    Difficulty waking up & talking after surgery  . Rocephin (Ceftriaxone Sodium In Dextrose)     Developed acute resp distress, wheezing after 2nd dose.    Family History  Problem Relation Age of Onset  . Cancer Sister   . Diabetes Mother   . Heart disease Father      Prior to Admission medications   Medication Sig Start Date End Date Taking? Authorizing Provider  aspirin EC 325 MG tablet Take 1 tablet (325 mg total) by mouth daily. 02/08/12  Yes Elease Etienne, MD  calcium carbonate (OS-CAL - DOSED IN MG OF ELEMENTAL CALCIUM) 1250 MG tablet Take 1 tablet by mouth daily.   Yes Historical Provider, MD  carvedilol (COREG) 6.25 MG tablet Take 6.25 mg by mouth 2 (two) times daily with a meal.     Yes Historical Provider, MD  diltiazem (CARDIZEM CD) 180 MG 24 hr capsule Take 180 mg by mouth at bedtime.    Yes Historical Provider, MD  gabapentin (NEURONTIN) 300 MG capsule Take 1 capsule (300 mg total) by mouth at bedtime. 02/17/12  Yes Evlyn Kanner Love, PA-C  insulin aspart (NOVOLOG) 100 UNIT/ML injection Inject 4-6 Units into the skin 3 (three) times daily with meals. Use 6 units with breakfast and supper.  Use 4 units with  lunch. 02/17/12  Yes Evlyn Kanner Love, PA-C  insulin glargine (LANTUS) 100 UNIT/ML injection Inject 28 Units into the skin at bedtime. 02/17/12  Yes Evlyn Kanner Love, PA-C  isosorbide mononitrate (IMDUR) 30 MG 24 hr tablet Take 30 mg by mouth daily.     Yes Historical Provider, MD  levothyroxine (SYNTHROID, LEVOTHROID) 200 MCG tablet Take 200 mcg by mouth every morning.    Yes Historical Provider, MD  neomycin-bacitracin-polymyxin (NEOSPORIN) ointment Apply topically daily. Apply to right foot callous on plantar surface and to upper toe callous Q day.  Leave open to air or cover with band aid if desired. 02/08/12  Yes Elease Etienne, MD  pantoprazole (PROTONIX) 40 MG tablet Take 1 tablet (40 mg total) by mouth daily. 12/27/11  Yes Alison Murray, MD  polycarbophil (FIBERCON) 625 MG tablet Take 2 tablets (1,250 mg total) by mouth daily. To help with formed stools. 02/17/12  Yes Evlyn Kanner Love, PA-C  protein supplement (RESOURCE BENEPROTEIN) POWD Take 6 g by mouth 3 (three) times daily with meals. 02/17/12  Yes Evlyn Kanner Love, PA-C  Tamsulosin HCl (FLOMAX) 0.4 MG CAPS Take 0.4 mg by mouth every morning.    Yes Historical Provider, MD  Multiple Vitamin (MULITIVITAMIN WITH MINERALS) TABS Take 1 tablet by mouth daily.    Historical Provider, MD   Physical Exam: Filed Vitals:   04/11/12 1613  BP: 144/70  Pulse: 76  Temp: 97.9 F (36.6 C)  TempSrc: Oral  Resp: 18  SpO2: 95%   Physical Exam: General: Vital signs reviewed and noted. Well-developed, well-nourished, in no acute distress; alert, appropriate and cooperative throughout examination.  Head: Normocephalic, atraumatic.  Eyes: PERRL, EOMI, No signs of anemia or jaundice.  Nose: Mucous membranes moist, not inflammed, nonerythematous.  Throat: Oropharynx nonerythematous, no exudate appreciated.   Neck: No deformities, masses, or tenderness noted.Supple, No carotid Bruits, no JVD.  Lungs:  Normal respiratory effort. Clear to auscultation BL without crackles or wheezes.  Heart: RRR. S1 and S2 normal without gallop, murmur, or rubs.  Abdomen:  BS normoactive. Soft, distended, non-tender.  No masses or organomegaly.  Extremities: No pretibial edema.  Neurologic: A&O X3, CN II - XII are grossly intact. Motor strength is 5/5 in the all 4 extremities, Sensations intact to light touch, Cerebellar signs negative.  Skin:  patient has friable skin, right fourth toe is swollen, red, nontender to touch, draining pus and blood     Wt Readings from Last 3 Encounters:  02/17/12 173 lb 4.5 oz (78.6 kg)  02/03/12 175 lb (79.379 kg)  12/27/11 178 lb 9.2 oz (81 kg)    Labs on  Admission:  Basic Metabolic Panel:  Recent Labs Lab 04/11/12 1755  NA 133*  K 4.4  CL 96  CO2 27  GLUCOSE 205*  BUN 23  CREATININE 1.60*  CALCIUM 9.3    CBC:  Recent Labs Lab 04/11/12 1755  WBC 16.6*  HGB 12.4  HCT 38.7  MCV 86.2  PLT 277   BNP (last 3 results)  Recent Labs  04/23/11 0505 04/24/11 1106 04/27/11 0450  PROBNP 4651.0* 8616.0* 2796.0*    Radiological Exams on Admission: Dg Toe 4th Right  04/11/2012  *RADIOLOGY REPORT*  Clinical Data: Skin ulcer fourth toe  RIGHT FOURTH TOE  Comparison: 07/26/2011  Findings: Soft tissue swelling and skin ulceration of the fourth toe.  Soft tissue calcification is present adjacent to the fourth middle phalanx.  This was not present previously.  This could be  related to fracture or possibly osteomyelitis.  IMPRESSION: Soft tissue swelling of the fourth toe.  There is soft tissue calcification adjacent to the ulcer and middle fourth phalanx which could be due to fracture or osteomyelitis.  This was not present previously.   Original Report Authenticated By: [name], M.D.      Principal Problem:   Osteomyelitis Active Problems:   DIABETES MELLITUS, TYPE II, UNCONTROLLED   DIABETIC PERIPHERAL NEUROPATHY   HYPERTENSION, BENIGN ESSENTIAL   Hypothyroidism   Renal failure (ARF), acute on chronic   Assessment/Plan Patient is a 79 year old female with past medical history most significant for uncontrolled diabetes, hypertension, hypothyroidism and chronic renal insufficiency who comes in with worsening of swelling in her right fourth to which is most likely consistent with osteomyelitis.  Osteomyelitis: Given that patient has diabetic foot ulcer which may have had been infected, I will start the patient on broad-spectrum antibiotics with vancomycin and Zosyn as per up-to-date. Patient has had history of multiple ulcers in the past. Her diabetic foot ulcer this worsened with uncontrolled blood sugars. -Admit to MedSurg  bed -Sliding scale insulin for tight control of blood sugar -Vanc Zosyn for antibiotics -Obtain MRI of right foot -Check ESR -Consider orthopedic consult after the results of MRI -check blood cultures although yield will be low -follow culture swab but yield will be low as most likely to be polymicrobial in the setting of diabetes   Rest of the medical management as per orders.   Code Status: Full code Family Communication: Daughter was present at bedside was updated Disposition Plan/Anticipated LOS: Guarded  Time spent: 75 minutes  Lars Mage, MD  [facility] Team 5  If 7PM-7AM, please contact night-coverage at [email or web address], password [facility] 04/11/2012, 8:14 PM

## 2012-04-12 ENCOUNTER — Inpatient Hospital Stay (HOSPITAL_COMMUNITY): Payer: Medicare Other

## 2012-04-12 DIAGNOSIS — E1169 Type 2 diabetes mellitus with other specified complication: Secondary | ICD-10-CM

## 2012-04-12 DIAGNOSIS — E11628 Type 2 diabetes mellitus with other skin complications: Secondary | ICD-10-CM

## 2012-04-12 DIAGNOSIS — I10 Essential (primary) hypertension: Secondary | ICD-10-CM

## 2012-04-12 DIAGNOSIS — L089 Local infection of the skin and subcutaneous tissue, unspecified: Secondary | ICD-10-CM

## 2012-04-12 DIAGNOSIS — R471 Dysarthria and anarthria: Secondary | ICD-10-CM

## 2012-04-12 DIAGNOSIS — N184 Chronic kidney disease, stage 4 (severe): Secondary | ICD-10-CM

## 2012-04-12 LAB — CBC WITH DIFFERENTIAL/PLATELET
Basophils Absolute: 0.1 10*3/uL (ref 0.0–0.1)
HCT: 39.1 % (ref 36.0–46.0)
Hemoglobin: 12.8 g/dL (ref 12.0–15.0)
Lymphocytes Relative: 12 % (ref 12–46)
Lymphs Abs: 1.7 10*3/uL (ref 0.7–4.0)
Monocytes Absolute: 1.3 10*3/uL — ABNORMAL HIGH (ref 0.1–1.0)
Monocytes Relative: 9 % (ref 3–12)
Neutro Abs: 10.8 10*3/uL — ABNORMAL HIGH (ref 1.7–7.7)
RBC: 4.59 MIL/uL (ref 3.87–5.11)
WBC: 14.2 10*3/uL — ABNORMAL HIGH (ref 4.0–10.5)

## 2012-04-12 LAB — GLUCOSE, CAPILLARY
Glucose-Capillary: 187 mg/dL — ABNORMAL HIGH (ref 70–99)
Glucose-Capillary: 224 mg/dL — ABNORMAL HIGH (ref 70–99)

## 2012-04-12 LAB — C-REACTIVE PROTEIN: CRP: <0.5 mg/dL — ABNORMAL LOW (ref <0.60)

## 2012-04-12 LAB — BASIC METABOLIC PANEL
Chloride: 95 mEq/L — ABNORMAL LOW (ref 96–112)
GFR calc Af Amer: 31 mL/min — ABNORMAL LOW (ref >90)
Potassium: 3.3 mEq/L — ABNORMAL LOW (ref 3.5–5.1)

## 2012-04-12 MED ORDER — ADULT MULTIVITAMIN W/MINERALS CH
1.0000 | ORAL_TABLET | Freq: Every day | ORAL | Status: DC
  Administered 2012-04-12 – 2012-04-17 (×5): 1 via ORAL
  Filled 2012-04-12 (×6): qty 1

## 2012-04-12 MED ORDER — SODIUM CHLORIDE 0.9 % IJ SOLN
3.0000 mL | INTRAMUSCULAR | Status: DC | PRN
  Administered 2012-04-12: 3 mL via INTRAVENOUS

## 2012-04-12 MED ORDER — SODIUM CHLORIDE 0.9 % IV SOLN: 250.0000 mL | INTRAVENOUS | Status: DC | PRN

## 2012-04-12 MED ORDER — SENNA 8.6 MG PO TABS
1.0000 | ORAL_TABLET | Freq: Two times a day (BID) | ORAL | Status: DC
  Administered 2012-04-12 – 2012-04-17 (×8): 8.6 mg via ORAL
  Filled 2012-04-12 (×7): qty 1

## 2012-04-12 MED ORDER — DILTIAZEM HCL ER COATED BEADS 180 MG PO CP24
180.0000 mg | ORAL_CAPSULE | Freq: Every day | ORAL | Status: DC
  Administered 2012-04-12 – 2012-04-16 (×5): 180 mg via ORAL
  Filled 2012-04-12 (×7): qty 1

## 2012-04-12 MED ORDER — PIPERACILLIN-TAZOBACTAM 3.375 G IVPB
3.3750 g | Freq: Three times a day (TID) | INTRAVENOUS | Status: DC
  Administered 2012-04-12 – 2012-04-13 (×4): 3.375 g via INTRAVENOUS
  Filled 2012-04-12 (×5): qty 50

## 2012-04-12 MED ORDER — INSULIN ASPART 100 UNIT/ML ~~LOC~~ SOLN
0.0000 [IU] | Freq: Every day | SUBCUTANEOUS | Status: DC
  Administered 2012-04-12: 4 [IU] via SUBCUTANEOUS

## 2012-04-12 MED ORDER — ONDANSETRON HCL 4 MG PO TABS: 4.0000 mg | ORAL_TABLET | Freq: Four times a day (QID) | ORAL | Status: DC | PRN

## 2012-04-12 MED ORDER — ISOSORBIDE MONONITRATE ER 30 MG PO TB24
30.0000 mg | ORAL_TABLET | Freq: Every day | ORAL | Status: DC
  Administered 2012-04-12 – 2012-04-17 (×5): 30 mg via ORAL
  Filled 2012-04-12 (×6): qty 1

## 2012-04-12 MED ORDER — GABAPENTIN 300 MG PO CAPS
300.0000 mg | ORAL_CAPSULE | Freq: Every day | ORAL | Status: DC
  Administered 2012-04-12 – 2012-04-16 (×5): 300 mg via ORAL
  Filled 2012-04-12 (×7): qty 1

## 2012-04-12 MED ORDER — INSULIN ASPART 100 UNIT/ML ~~LOC~~ SOLN
0.0000 [IU] | Freq: Three times a day (TID) | SUBCUTANEOUS | Status: DC
  Administered 2012-04-12 (×2): 2 [IU] via SUBCUTANEOUS
  Administered 2012-04-12: 3 [IU] via SUBCUTANEOUS
  Administered 2012-04-13: 5 [IU] via SUBCUTANEOUS

## 2012-04-12 MED ORDER — TAMSULOSIN HCL 0.4 MG PO CAPS
0.4000 mg | ORAL_CAPSULE | Freq: Every morning | ORAL | Status: DC
  Administered 2012-04-12 – 2012-04-17 (×5): 0.4 mg via ORAL
  Filled 2012-04-12 (×6): qty 1

## 2012-04-12 MED ORDER — LEVOTHYROXINE SODIUM 200 MCG PO TABS
200.0000 ug | ORAL_TABLET | Freq: Every morning | ORAL | Status: DC
  Administered 2012-04-12 – 2012-04-17 (×5): 200 ug via ORAL
  Filled 2012-04-12 (×7): qty 1

## 2012-04-12 MED ORDER — CARVEDILOL 6.25 MG PO TABS
6.2500 mg | ORAL_TABLET | Freq: Two times a day (BID) | ORAL | Status: DC
  Administered 2012-04-12 – 2012-04-17 (×10): 6.25 mg via ORAL
  Filled 2012-04-12 (×13): qty 1

## 2012-04-12 MED ORDER — CALCIUM CARBONATE 1250 (500 CA) MG PO TABS
1.0000 | ORAL_TABLET | Freq: Every day | ORAL | Status: DC
  Administered 2012-04-12 – 2012-04-17 (×5): 500 mg via ORAL
  Filled 2012-04-12 (×6): qty 1

## 2012-04-12 MED ORDER — OXYCODONE HCL 5 MG PO TABS
5.0000 mg | ORAL_TABLET | ORAL | Status: DC | PRN
  Administered 2012-04-12: 5 mg via ORAL
  Filled 2012-04-12: qty 1

## 2012-04-12 MED ORDER — PANTOPRAZOLE SODIUM 40 MG PO TBEC
40.0000 mg | DELAYED_RELEASE_TABLET | Freq: Every day | ORAL | Status: DC
  Administered 2012-04-12 – 2012-04-17 (×5): 40 mg via ORAL
  Filled 2012-04-12 (×6): qty 1

## 2012-04-12 MED ORDER — CALCIUM POLYCARBOPHIL 625 MG PO TABS
1250.0000 mg | ORAL_TABLET | Freq: Every day | ORAL | Status: DC
  Administered 2012-04-12 – 2012-04-17 (×5): 1250 mg via ORAL
  Filled 2012-04-12 (×6): qty 2

## 2012-04-12 MED ORDER — SODIUM CHLORIDE 0.9 % IJ SOLN
3.0000 mL | Freq: Two times a day (BID) | INTRAMUSCULAR | Status: DC
  Administered 2012-04-12 – 2012-04-17 (×6): 3 mL via INTRAVENOUS

## 2012-04-12 MED ORDER — ONDANSETRON HCL 4 MG/2ML IJ SOLN: 4.0000 mg | Freq: Four times a day (QID) | INTRAMUSCULAR | Status: DC | PRN

## 2012-04-12 MED ORDER — HEPARIN SODIUM (PORCINE) 5000 UNIT/ML IJ SOLN
5000.0000 [IU] | Freq: Three times a day (TID) | INTRAMUSCULAR | Status: DC
  Administered 2012-04-12 – 2012-04-17 (×16): 5000 [IU] via SUBCUTANEOUS
  Filled 2012-04-12 (×19): qty 1

## 2012-04-12 MED ORDER — ASPIRIN EC 325 MG PO TBEC
325.0000 mg | DELAYED_RELEASE_TABLET | Freq: Every day | ORAL | Status: DC
  Administered 2012-04-12 – 2012-04-17 (×5): 325 mg via ORAL
  Filled 2012-04-12 (×6): qty 1

## 2012-04-12 MED ORDER — RESOURCE INSTANT PROTEIN PO PWD PACKET
1.0000 | Freq: Three times a day (TID) | ORAL | Status: DC
  Administered 2012-04-12 – 2012-04-17 (×9): 6 g via ORAL
  Filled 2012-04-12: qty 227
  Filled 2012-04-12: qty 6

## 2012-04-12 NOTE — Progress Notes (Signed)
[facility] PROGRESS NOTE  [name] WJX:[id number] DOB: 1919/10/21 DOA: 04/11/2012 PCP: [name], MD  Assessment/Plan: Cellulitis right lower extremity -Continue vancomycin and Zosyn empirically Right fourth toe diabetic foot infection - Concerned about underlyin abscess/osteomyelitis -X-ray right foot shows soft tissue edema with soft tissue calcification -MRI right foot  -Wound cultures obtained  -ESR 80   diabetes mellitus type 2, poorly controlled -Elevated sugars partly due to infection -Continue NovoLog sliding scale for now -Hold Lantus for now as the patient's by mouth intake is poor CKD stage III -Baseline creatinine 1.3-1.6 -02/05/2012 hemoglobin A1c 10.3 Hypertension -Continue carvedilol, Cardizem Coronary artery disease with history of myocardial infarction -Continue aspirin -Continue Imdur Hypothyroidism -Continue Synthroid Chronic dysarthria -Recent MRI January 2014 negative for infarct Chronic diastolic CHF -Compensated -EF 21-30%     Family Communication:   Pt at beside Disposition Plan:   Home when medically stable    Antibiotics:  Vancomycin 04/11/2012>>>  Zosyn 04/11/2012 >>>    Procedures/Studies: Dg Toe 4th Right  04/11/2012  *RADIOLOGY REPORT*  Clinical Data: Skin ulcer fourth toe  RIGHT FOURTH TOE  Comparison: 07/26/2011  Findings: Soft tissue swelling and skin ulceration of the fourth toe.  Soft tissue calcification is present adjacent to the fourth middle phalanx.  This was not present previously.  This could be related to fracture or possibly osteomyelitis.  IMPRESSION: Soft tissue swelling of the fourth toe.  There is soft tissue calcification adjacent to the ulcer and middle fourth phalanx which could be due to fracture or osteomyelitis.  This was not present previously.   Original Report Authenticated By: [name], M.D.          Subjective:  patient denies fevers, chills, chest pain, shortness breath, abdominal  pain. Had one episode of dry heaves this morning. Currently eating lunch without difficulty.denies dysuria or hematuria   Objective: Filed Vitals:   04/11/12 2337 04/12/12 0020 04/12/12 0027 04/12/12 0659  BP: 157/57  172/69 106/67  Pulse: 71  72 91  Temp:   97.7 F (36.5 C) 97.6 F (36.4 C)  TempSrc:   Oral Oral  Resp: 18  16 16   Height:  5\' 8"  (1.727 m)    Weight:  73.5 kg (162 lb 0.6 oz)    SpO2: 95%  95% 92%    Intake/Output Summary (Last 24 hours) at 04/12/12 1300 Last data filed at 04/12/12 0654  Gross per 24 hour  Intake      0 ml  Output    375 ml  Net   -375 ml   Weight change:  Exam:   General:  [name] alert, follows commands appropriately, not in acute distress  HEENT: No icterus, No thrush,[place]/AT  Cardiovascular: RRR, S1/S2, no rubs, no gallops  Respiratory: CTA bilaterally, no wheezing, no crackles, no rhonchi  Abdomen: Soft/+BS, non tender, non distended, no guarding  Extremities: right foot erythema on the dorsal aspect extending to the distal third of the pretibial area; R- fourth toe with edema and eschar. Small amount of purulent drainage expressed.   Data Reviewed: Basic Metabolic Panel:  Recent Labs Lab 04/11/12 1755 04/12/12 0506  NA 133* 134*  K 4.4 3.3*  CL 96 95*  CO2 27 27  GLUCOSE 205* 105*  BUN 23 22  CREATININE 1.60* 1.64*  CALCIUM 9.3 9.4   Liver Function Tests: No results found for this basename: AST, ALT, ALKPHOS, BILITOT, PROT, ALBUMIN,  in the last 168 hours No results found for this basename: LIPASE, AMYLASE,  in the last 168 hours No results found for this basename: AMMONIA,  in the last 168 hours CBC:  Recent Labs Lab 04/11/12 1755 04/12/12 0506  WBC 16.6* 14.2*  NEUTROABS  --  10.8*  HGB 12.4 12.8  HCT 38.7 39.1  MCV 86.2 85.2  PLT 277 304   Cardiac Enzymes: No results found for this basename: CKTOTAL, CKMB, CKMBINDEX, TROPONINI,  in the last 168 hours BNP: No components found with this basename: POCBNP,   CBG:  Recent Labs Lab 04/12/12 0711 04/12/12 1114  GLUCAP 166* 187*    Recent Results (from the past 240 hour(s))  WOUND CULTURE     Status: None   Collection Time    04/11/12  8:07 PM      Result Value Range Status   Specimen Description FOOT   Final   Special Requests Normal   Final   Gram Stain     Final   Value: NO WBC SEEN     RARE SQUAMOUS EPITHELIAL CELLS PRESENT     FEW GRAM POSITIVE COCCI IN PAIRS     IN CLUSTERS   Culture NO GROWTH   Final   Report Status PENDING   Incomplete     Scheduled Meds: . aspirin EC  325 mg Oral Daily  . calcium carbonate  1 tablet Oral Daily  . carvedilol  6.25 mg Oral BID WC  . diltiazem  180 mg Oral QHS  . gabapentin  300 mg Oral QHS  . heparin  5,000 Units Subcutaneous Q8H  . insulin aspart  0-5 Units Subcutaneous QHS  . insulin aspart  0-9 Units Subcutaneous TID WC  . isosorbide mononitrate  30 mg Oral Daily  . levothyroxine  200 mcg Oral q morning - 10a  . multivitamin with minerals  1 tablet Oral Daily  . pantoprazole  40 mg Oral Daily  . piperacillin-tazobactam (ZOSYN)  IV  3.375 g Intravenous Q8H  . polycarbophil  1,250 mg Oral Daily  . protein supplement  1 scoop Oral TID WC  . senna  1 tablet Oral BID  . sodium chloride  3 mL Intravenous Q12H  . tamsulosin  0.4 mg Oral q morning - 10a  . vancomycin  750 mg Intravenous Q24H   Continuous Infusions:    Catalia Massett, DO  [facility] Pager [phone]  If 7PM-7AM, please contact night-coverage [email or web address] Password [email or web address] 04/12/2012, 1:00 PM   LOS: 1 day

## 2012-04-12 NOTE — Progress Notes (Signed)
Pt being transported to MRI.  Began having dry heaves and was diaphoretic,  Pt transported back to room.  CBG:166. Pt drowsy but oriented.  Resp:16, HR: 76.  No obvious distress noted.

## 2012-04-12 NOTE — Progress Notes (Signed)
Utilization review completed.  

## 2012-04-12 NOTE — Progress Notes (Signed)
ANTIBIOTIC CONSULT NOTE - INITIAL  Pharmacy Consult for Zosyn Indication: Osteo  Allergies  Allergen Reactions  . Ephedrine   . Morphine Other (See Comments)    Difficulty waking up & talking after surgery  . Rocephin (Ceftriaxone Sodium In Dextrose)     Developed acute resp distress, wheezing after 2nd dose.   Vital Signs: Temp: 97.7 F (36.5 C) (03/19 0027) Temp src: Oral (03/19 0027) BP: 172/69 mmHg (03/19 0027) Pulse Rate: 72 (03/19 0027)  Labs:  Recent Labs  04/11/12 1755  WBC 16.6*  HGB 12.4  PLT 277  CREATININE 1.60*   Medical History: Past Medical History  Diagnosis Date  . Myocardial infarction   . Coronary artery disease   . Hypertension   . Dysrhythmia   . Peripheral vascular disease   . Hypothyroidism   . Diabetes mellitus   . Presbyesophagus 04/23/2011  . Urinary retention with incomplete bladder emptying 04/23/2011  . UTI (lower urinary tract infection) 04/21/2011  . Diastolic CHF, chronic     Grade I diastolic dysfunction on Echo 04/26/11  . Anaphylaxis 04/25/2011    Thought to be from Rocephin    Medications:  Anti-infectives   Start     Dose/Rate Route Frequency Ordered Stop   04/12/12 0200  piperacillin-tazobactam (ZOSYN) IVPB 3.375 g     3.375 g 12.5 mL/hr over 240 Minutes Intravenous Every 8 hours 04/12/12 0059     04/11/12 2000  vancomycin (VANCOCIN) 750 mg in sodium chloride 0.9 % 150 mL IVPB     750 mg 150 mL/hr over 60 Minutes Intravenous Every 24 hours 04/11/12 1908     04/11/12 1730  clindamycin (CLEOCIN) capsule 300 mg     300 mg Oral  Once 04/11/12 1729 04/11/12 1738     Assessment: 79 yo F presents to ER 04/11/12 with right 4th toe which is black with brownish discharge. Pt has hx of DM. Plan now is to start Zosyn per Rx for osteo. SCr=1.6, wt 78.6kg (Jan 2013), CrCl ~ 27ml/min.  Supposed allergic RXN to Rocephin 3/13- tolerated  Zosyn b/f.  Spoke with MD- wants to proceed with Zosyn.  Goal of Therapy:  Treat infection  Plan:    Zosyn 3.375 Gm IV q8h.    F/U SCr as needed   Lorenza Evangelist 04/12/2012,1:26 AM

## 2012-04-13 DIAGNOSIS — M86179 Other acute osteomyelitis, unspecified ankle and foot: Secondary | ICD-10-CM

## 2012-04-13 DIAGNOSIS — M869 Osteomyelitis, unspecified: Secondary | ICD-10-CM

## 2012-04-13 DIAGNOSIS — N179 Acute kidney failure, unspecified: Secondary | ICD-10-CM

## 2012-04-13 DIAGNOSIS — N189 Chronic kidney disease, unspecified: Secondary | ICD-10-CM

## 2012-04-13 LAB — BASIC METABOLIC PANEL
BUN: 30 mg/dL — ABNORMAL HIGH (ref 6–23)
GFR calc Af Amer: 25 mL/min — ABNORMAL LOW (ref >90)
GFR calc non Af Amer: 21 mL/min — ABNORMAL LOW (ref >90)
Potassium: 4.3 mEq/L (ref 3.5–5.1)
Sodium: 132 mEq/L — ABNORMAL LOW (ref 135–145)

## 2012-04-13 LAB — GLUCOSE, CAPILLARY
Glucose-Capillary: 333 mg/dL — ABNORMAL HIGH (ref 70–99)
Glucose-Capillary: 333 mg/dL — ABNORMAL HIGH (ref 70–99)

## 2012-04-13 LAB — VANCOMYCIN, TROUGH: Vancomycin Tr: 9.9 ug/mL — ABNORMAL LOW (ref 10.0–20.0)

## 2012-04-13 LAB — CBC
Hemoglobin: 11.6 g/dL — ABNORMAL LOW (ref 12.0–15.0)
MCHC: 32.6 g/dL (ref 30.0–36.0)

## 2012-04-13 MED ORDER — PIPERACILLIN-TAZOBACTAM IN DEX 2-0.25 GM/50ML IV SOLN
2.2500 g | Freq: Four times a day (QID) | INTRAVENOUS | Status: DC
  Filled 2012-04-13: qty 50

## 2012-04-13 MED ORDER — INSULIN ASPART 100 UNIT/ML ~~LOC~~ SOLN
0.0000 [IU] | Freq: Every day | SUBCUTANEOUS | Status: DC
  Administered 2012-04-13: 4 [IU] via SUBCUTANEOUS
  Administered 2012-04-14: 2 [IU] via SUBCUTANEOUS
  Administered 2012-04-15: 5 [IU] via SUBCUTANEOUS
  Administered 2012-04-16: 2 [IU] via SUBCUTANEOUS

## 2012-04-13 MED ORDER — INSULIN ASPART 100 UNIT/ML ~~LOC~~ SOLN
0.0000 [IU] | Freq: Three times a day (TID) | SUBCUTANEOUS | Status: DC
  Administered 2012-04-13 (×2): 8 [IU] via SUBCUTANEOUS
  Administered 2012-04-15: 5 [IU] via SUBCUTANEOUS
  Administered 2012-04-15 – 2012-04-16 (×3): 8 [IU] via SUBCUTANEOUS
  Administered 2012-04-16: 11 [IU] via SUBCUTANEOUS
  Administered 2012-04-16: 5 [IU] via SUBCUTANEOUS
  Administered 2012-04-17: 3 [IU] via SUBCUTANEOUS

## 2012-04-13 MED ORDER — VANCOMYCIN HCL 1000 MG IV SOLR
750.0000 mg | INTRAVENOUS | Status: DC
  Administered 2012-04-13 – 2012-04-15 (×3): 750 mg via INTRAVENOUS
  Filled 2012-04-13 (×3): qty 750

## 2012-04-13 MED ORDER — INSULIN GLARGINE 100 UNIT/ML ~~LOC~~ SOLN
10.0000 [IU] | Freq: Every day | SUBCUTANEOUS | Status: DC
  Administered 2012-04-13: 10 [IU] via SUBCUTANEOUS
  Filled 2012-04-13 (×2): qty 0.1

## 2012-04-13 MED ORDER — SODIUM CHLORIDE 0.9 % IV SOLN: INTRAVENOUS | Status: DC

## 2012-04-13 NOTE — Progress Notes (Signed)
[facility] PROGRESS NOTE  [name] JYN:[id number] DOB: [birth year] DOA: 04/11/2012 PCP: [name], MD  Assessment/Plan: Cellulitis right lower extremity  -Continue vancomycin -Discontinue Zosyn Right fourth toe diabetic foot infection/Osteomyelitis  -MRI R-foot shows destruction of middle phalanx of 4th toe suggestive of  -X-ray right foot shows soft tissue edema with soft tissue calcification  -Consulted Dr. [name] for debridement, possible amputation -Wound cultures obtained-->staph aureus -ESR 80  diabetes mellitus type 2, poorly controlled  -Elevated sugars partly due to infection  -Continue NovoLog sliding scale for now  -Start Lantus 10 units at bedtime Acute on chronic renal failure ,CKD stage III  -Baseline creatinine 1.3-1.6  -02/05/2012 hemoglobin A1c 10.3  -Gentle hydration Hypertension  -Continue carvedilol, Cardizem  Coronary artery disease with history of myocardial infarction  -Continue aspirin  -Continue Imdur  Hypothyroidism  -Continue Synthroid  Chronic dysarthria  -Recent MRI brain January 2014 negative for infarct  Chronic diastolic CHF  -Compensated  -EF 60-65%  Antibiotics:  Vancomycin 04/11/2012>>>  Zosyn 04/11/2012 >>>04/12/12     Family Communication:   Daughter updated Disposition Plan:   Home when medically stable       Procedures/Studies: Mr Foot Right Wo Contrast  04/13/2012  *RADIOLOGY REPORT*  Clinical Data: Cellulitis a soft tissue ulceration of the fourth toe.  MRI OF THE RIGHT FOREFOOT WITHOUT CONTRAST  Technique:  Multiplanar, multisequence MR imaging was performed. No intravenous contrast was administered.  Comparison: Radiographs dated 04/11/2012  Findings: There is partial destruction of the distal aspect of the middle phalangeal bone of the fourth toe with a joint effusion and fluid under the periosteum of the distal aspect of the proximal phalangeal bone.  These findings are consistent with osteomyelitis.  There is  slight edema in the head and distal shaft of the fourth metatarsal but there is no joint effusion or bone destruction. This is probably due to hyperemia.  The remainder of the forefoot demonstrates no acute abnormalities.  Degenerative changes at the first metatarsal phalangeal joint and at the tarsometatarsal joints.  IMPRESSION:   Cellulitis and osteomyelitis of the fourth toe.  This is centered at the DIP joint of the toe but appears to involve the distal aspect of the proximal phalangeal bone as well.   Original Report Authenticated By: Francene Boyers, M.D.    Dg Toe 4th Right  04/11/2012  *RADIOLOGY REPORT*  Clinical Data: Skin ulcer fourth toe  RIGHT FOURTH TOE  Comparison: 07/26/2011  Findings: Soft tissue swelling and skin ulceration of the fourth toe.  Soft tissue calcification is present adjacent to the fourth middle phalanx.  This was not present previously.  This could be related to fracture or possibly osteomyelitis.  IMPRESSION: Soft tissue swelling of the fourth toe.  There is soft tissue calcification adjacent to the ulcer and middle fourth phalanx which could be due to fracture or osteomyelitis.  This was not present previously.   Original Report Authenticated By: [name], M.D.          Subjective: Patient denies fever, chills, chest pain, shortness breath, nausea, vomiting, diarrhea, abdominal pain, dysuria, hematuria. She has some mild foot pain.  Objective: Filed Vitals:   04/12/12 1346 04/12/12 1438 04/12/12 2200 04/13/12 0600  BP:  103/63 146/50 144/60  Pulse:  72 68 61  Temp:  97.9 F (36.6 C) 97.7 F (36.5 C) 98 F (36.7 C)  TempSrc:  Oral Oral Oral  Resp:  18 18 18   Height: 5\' 8"  (1.727 m)  Weight: 73.483 kg (162 lb)     SpO2:  93% 97% 94%    Intake/Output Summary (Last 24 hours) at 04/13/12 1026 Last data filed at 04/13/12 0500  Gross per 24 hour  Intake    500 ml  Output      0 ml  Net    500 ml   Weight change: -0.017 kg (-0.6  oz) Exam:   General:  [name] alert, follows commands appropriately, not in acute distress  HEENT: No icterus, No thrush,Turner/AT  Cardiovascular: RRR, S1/S2, no rubs, no gallops  Respiratory: Left basilar crackles. Right foot auscultation. No wheezes or rhonchi. Good air movement.  Abdomen: Soft/+BS, non tender, non distended, no guarding  Extremities: Right fourth toe with edema and erythema with distal necrosis, no crepitance; mild erythema right foot on the dorsal fourth metatarsal area without any streaking, crepitance, necrosis  Data Reviewed: Basic Metabolic Panel:  Recent Labs Lab 04/11/12 1755 04/12/12 0506 04/13/12 0450  NA 133* 134* 132*  K 4.4 3.3* 4.3  CL 96 95* 95*  CO2 27 27 25   GLUCOSE 205* 105* 266*  BUN 23 22 30*  CREATININE 1.60* 1.64* 1.96*  CALCIUM 9.3 9.4 8.8   Liver Function Tests: No results found for this basename: AST, ALT, ALKPHOS, BILITOT, PROT, ALBUMIN,  in the last 168 hours No results found for this basename: LIPASE, AMYLASE,  in the last 168 hours No results found for this basename: AMMONIA,  in the last 168 hours CBC:  Recent Labs Lab 04/11/12 1755 04/12/12 0506 04/13/12 0450  WBC 16.6* 14.2* 10.7*  NEUTROABS  --  10.8*  --   HGB 12.4 12.8 11.6*  HCT 38.7 39.1 35.6*  MCV 86.2 85.2 84.8  PLT 277 304 279   Cardiac Enzymes: No results found for this basename: CKTOTAL, CKMB, CKMBINDEX, TROPONINI,  in the last 168 hours BNP: No components found with this basename: POCBNP,  CBG:  Recent Labs Lab 04/12/12 0711 04/12/12 1114 04/12/12 1727 04/12/12 2131 04/13/12 0800  GLUCAP 166* 187* 224* 333* 300*    Recent Results (from the past 240 hour(s))  WOUND CULTURE     Status: None   Collection Time    04/11/12  8:07 PM      Result Value Range Status   Specimen Description FOOT   Final   Special Requests Normal   Final   Gram Stain     Final   Value: NO WBC SEEN     RARE SQUAMOUS EPITHELIAL CELLS PRESENT     FEW GRAM POSITIVE  COCCI IN PAIRS     IN CLUSTERS   Culture     Final   Value: MODERATE STAPHYLOCOCCUS AUREUS     Note: RIFAMPIN AND GENTAMICIN SHOULD NOT BE USED AS SINGLE DRUGS FOR TREATMENT OF STAPH INFECTIONS.   Report Status PENDING   Incomplete     Scheduled Meds: . aspirin EC  325 mg Oral Daily  . calcium carbonate  1 tablet Oral Daily  . carvedilol  6.25 mg Oral BID WC  . diltiazem  180 mg Oral QHS  . gabapentin  300 mg Oral QHS  . heparin  5,000 Units Subcutaneous Q8H  . insulin aspart  0-5 Units Subcutaneous QHS  . insulin aspart  0-9 Units Subcutaneous TID WC  . isosorbide mononitrate  30 mg Oral Daily  . levothyroxine  200 mcg Oral q morning - 10a  . multivitamin with minerals  1 tablet Oral Daily  . pantoprazole  40  mg Oral Daily  . piperacillin-tazobactam (ZOSYN)  IV  2.25 g Intravenous Q6H  . polycarbophil  1,250 mg Oral Daily  . protein supplement  1 scoop Oral TID WC  . senna  1 tablet Oral BID  . sodium chloride  3 mL Intravenous Q12H  . tamsulosin  0.4 mg Oral q morning - 10a   Continuous Infusions:    Chalyn Amescua, DO  [facility] Pager [phone]  If 7PM-7AM, please contact night-coverage [email or web address] Password [email or web address] 04/13/2012, 10:26 AM   LOS: 2 days

## 2012-04-13 NOTE — Progress Notes (Signed)
ANTIBIOTIC CONSULT NOTE - Follow-up  Pharmacy Consult for Vanco/zosyn Indication: Osteo  Allergies  Allergen Reactions  . Ephedrine   . Morphine Other (See Comments)    Difficulty waking up & talking after surgery  . Rocephin (Ceftriaxone Sodium In Dextrose)     Developed acute resp distress, wheezing after 2nd dose.   Vital Signs: Temp: 97.6 F (36.4 C) (03/20 1400) Temp src: Oral (03/20 1400) BP: 130/68 mmHg (03/20 1400) Pulse Rate: 63 (03/20 1400)  Labs:  Recent Labs  04/11/12 1755 04/12/12 0506 04/13/12 0450  WBC 16.6* 14.2* 10.7*  HGB 12.4 12.8 11.6*  PLT 277 304 279  CREATININE 1.60* 1.64* 1.96*   Medical History: Past Medical History  Diagnosis Date  . Myocardial infarction   . Coronary artery disease   . Hypertension   . Dysrhythmia   . Peripheral vascular disease   . Hypothyroidism   . Diabetes mellitus   . Presbyesophagus 04/23/2011  . Urinary retention with incomplete bladder emptying 04/23/2011  . UTI (lower urinary tract infection) 04/21/2011  . Diastolic CHF, chronic     Grade I diastolic dysfunction on Echo 04/26/11  . Anaphylaxis 04/25/2011    Thought to be from Rocephin    Medications:  Anti-infectives   Start     Dose/Rate Route Frequency Ordered Stop   04/13/12 1200  piperacillin-tazobactam (ZOSYN) IVPB 2.25 g  Status:  Discontinued     2.25 g 100 mL/hr over 30 Minutes Intravenous 4 times per day 04/13/12 0913 04/13/12 1039   04/12/12 0200  piperacillin-tazobactam (ZOSYN) IVPB 3.375 g  Status:  Discontinued     3.375 g 12.5 mL/hr over 240 Minutes Intravenous Every 8 hours 04/12/12 0059 04/13/12 0913   04/11/12 2000  vancomycin (VANCOCIN) 750 mg in sodium chloride 0.9 % 150 mL IVPB  Status:  Discontinued     750 mg 150 mL/hr over 60 Minutes Intravenous Every 24 hours 04/11/12 1908 04/13/12 0910   04/11/12 1730  clindamycin (CLEOCIN) capsule 300 mg     300 mg Oral  Once 04/11/12 1729 04/11/12 1738     Assessment:  79 yo F admitted after  presenting to ER 04/11/12 with right 4th toe which is black with brownish discharge.   Pt has hx of DM.   D # 3 Vanco 750mg  IV q24h for osteo  Also on D# 3 Clinda and D#2 Zosyn for osteo  Noted amputation planned  Scr was rising so vancomycin trough ordered  VT = 9.9   Goal of Therapy:  Vancomycin trough level 15-20 mcg/ml  Plan:   Continue vancomycin 750mg  IV 24h  Follow Scr and recheck vancomycin trough within 48hrs or sooner if warranted  Gwen Her PharmD  [phone] 04/13/2012 8:08 PM

## 2012-04-13 NOTE — Progress Notes (Signed)
Inpatient Diabetes Program Recommendations  AACE/ADA: New Consensus Statement on Inpatient Glycemic Control (2013)  Target Ranges:  Prepandial:   less than 140 mg/dL      Peak postprandial:   less than 180 mg/dL (1-2 hours)      Critically ill patients:  140 - 180 mg/dL   Reason for Assessment:  Hyperglycemia  Results for [name], [name] (MRN [id number]) as of 04/13/2012 12:32  Ref. Range 04/12/2012 11:14 04/12/2012 17:27 04/12/2012 21:31 04/13/2012 08:00  Glucose-Capillary Latest Range: 70-99 mg/dL 865 (H) 784 (H) 696 (H) 300 (H)     Inpatient Diabetes Program Recommendations Insulin - Basal: Increase Lantus to 20 units QHS (Home dose is 28 units) Insulin - Meal Coverage: Add meal coverage insulin - Novolog 3 units tidwc if pt eats >50% meals  Note: Will continue to follow.  Thank you. Ailene Ards, RD, LDN, CDE Inpatient Diabetes Coordinator [phone]

## 2012-04-13 NOTE — Consult Note (Signed)
Reason for Consult: Abscess ostomy myelitis right foot fourth toe Referring Physician: [name] is an 79 y.o. female.  HPI: Patient is a 79 year old woman who has been treated for a prolonged period of time at the [facility]. She presents at this time with abscess osteoarthritis right foot fourth toe  Past Medical History  Diagnosis Date  . Myocardial infarction   . Coronary artery disease   . Hypertension   . Dysrhythmia   . Peripheral vascular disease   . Hypothyroidism   . Diabetes mellitus   . Presbyesophagus 04/23/2011  . Urinary retention with incomplete bladder emptying 04/23/2011  . UTI (lower urinary tract infection) 04/21/2011  . Diastolic CHF, chronic     Grade I diastolic dysfunction on Echo 04/26/11  . Anaphylaxis 04/25/2011    Thought to be from Rocephin    Past Surgical History  Procedure Laterality Date  . Cholecystectomy    . Colonoscopy  01/15/2011    Procedure: COLONOSCOPY;  Surgeon: Theda Belfast, MD;  Location: [facility];  Service: Endoscopy;  Laterality: N/A;    Family History  Problem Relation Age of Onset  . Cancer Sister   . Diabetes Mother   . Heart disease Father     Social History:  reports that she quit smoking about 31 years ago. Her smoking use included Cigarettes. She smoked 0.00 packs per day. She has never used smokeless tobacco. She reports that  drinks alcohol. She reports that she does not use illicit drugs.  Allergies:  Allergies  Allergen Reactions  . Ephedrine   . Morphine Other (See Comments)    Difficulty waking up & talking after surgery  . Rocephin (Ceftriaxone Sodium In Dextrose)     Developed acute resp distress, wheezing after 2nd dose.    Medications: I have reviewed the patient's current medications.  Results for orders placed during the hospital encounter of 04/11/12 (from the past 48 hour(s))  C-REACTIVE PROTEIN     Status: Abnormal   Collection Time    04/11/12  7:30 PM      Result  Value Range   CRP <0.5 (*) <0.60 mg/dL  WOUND CULTURE     Status: None   Collection Time    04/11/12  8:07 PM      Result Value Range   Specimen Description FOOT     Special Requests Normal     Gram Stain       Value: NO WBC SEEN     RARE SQUAMOUS EPITHELIAL CELLS PRESENT     FEW GRAM POSITIVE COCCI IN PAIRS     IN CLUSTERS   Culture       Value: MODERATE STAPHYLOCOCCUS AUREUS     Note: RIFAMPIN AND GENTAMICIN SHOULD NOT BE USED AS SINGLE DRUGS FOR TREATMENT OF STAPH INFECTIONS.   Report Status PENDING    BASIC METABOLIC PANEL     Status: Abnormal   Collection Time    04/12/12  5:06 AM      Result Value Range   Sodium 134 (*) 135 - 145 mEq/L   Potassium 3.3 (*) 3.5 - 5.1 mEq/L   Comment: DELTA CHECK NOTED     REPEATED TO VERIFY   Chloride 95 (*) 96 - 112 mEq/L   CO2 27  19 - 32 mEq/L   Glucose, Bld 105 (*) 70 - 99 mg/dL   BUN 22  6 - 23 mg/dL   Creatinine, Ser 1.61 (*) 0.50 - 1.10  mg/dL   Calcium 9.4  8.4 - 16.1 mg/dL   GFR calc non Af Amer 26 (*) >90 mL/min   GFR calc Af Amer 31 (*) >90 mL/min   Comment:            The eGFR has been calculated     using the CKD EPI equation.     This calculation has not been     validated in all clinical     situations.     eGFR's persistently     <90 mL/min signify     possible Chronic Kidney Disease.  CBC WITH DIFFERENTIAL     Status: Abnormal   Collection Time    04/12/12  5:06 AM      Result Value Range   WBC 14.2 (*) 4.0 - 10.5 K/uL   RBC 4.59  3.87 - 5.11 MIL/uL   Hemoglobin 12.8  12.0 - 15.0 g/dL   HCT 09.6  04.5 - 40.9 %   MCV 85.2  78.0 - 100.0 fL   MCH 27.9  26.0 - 34.0 pg   MCHC 32.7  30.0 - 36.0 g/dL   RDW 81.1  91.4 - 78.2 %   Platelets 304  150 - 400 K/uL   Neutrophils Relative 76  43 - 77 %   Neutro Abs 10.8 (*) 1.7 - 7.7 K/uL   Lymphocytes Relative 12  12 - 46 %   Lymphs Abs 1.7  0.7 - 4.0 K/uL   Monocytes Relative 9  3 - 12 %   Monocytes Absolute 1.3 (*) 0.1 - 1.0 K/uL   Eosinophils Relative 2  0 - 5 %    Eosinophils Absolute 0.3  0.0 - 0.7 K/uL   Basophils Relative 1  0 - 1 %   Basophils Absolute 0.1  0.0 - 0.1 K/uL  SEDIMENTATION RATE     Status: Abnormal   Collection Time    04/12/12  5:06 AM      Result Value Range   Sed Rate 80 (*) 0 - 22 mm/hr  GLUCOSE, CAPILLARY     Status: Abnormal   Collection Time    04/12/12  7:11 AM      Result Value Range   Glucose-Capillary 166 (*) 70 - 99 mg/dL   Comment 1 Notify RN    GLUCOSE, CAPILLARY     Status: Abnormal   Collection Time    04/12/12 11:14 AM      Result Value Range   Glucose-Capillary 187 (*) 70 - 99 mg/dL   Comment 1 Notify RN    GLUCOSE, CAPILLARY     Status: Abnormal   Collection Time    04/12/12  5:27 PM      Result Value Range   Glucose-Capillary 224 (*) 70 - 99 mg/dL  GLUCOSE, CAPILLARY     Status: Abnormal   Collection Time    04/12/12  9:31 PM      Result Value Range   Glucose-Capillary 333 (*) 70 - 99 mg/dL   Comment 1 Notify RN    BASIC METABOLIC PANEL     Status: Abnormal   Collection Time    04/13/12  4:50 AM      Result Value Range   Sodium 132 (*) 135 - 145 mEq/L   Potassium 4.3  3.5 - 5.1 mEq/L   Comment: NO VISIBLE HEMOLYSIS     DELTA CHECK NOTED   Chloride 95 (*) 96 - 112 mEq/L   CO2 25  19 - 32 mEq/L  Glucose, Bld 266 (*) 70 - 99 mg/dL   BUN 30 (*) 6 - 23 mg/dL   Creatinine, Ser 1.61 (*) 0.50 - 1.10 mg/dL   Calcium 8.8  8.4 - 09.6 mg/dL   GFR calc non Af Amer 21 (*) >90 mL/min   GFR calc Af Amer 25 (*) >90 mL/min   Comment:            The eGFR has been calculated     using the CKD EPI equation.     This calculation has not been     validated in all clinical     situations.     eGFR's persistently     <90 mL/min signify     possible Chronic Kidney Disease.  CBC     Status: Abnormal   Collection Time    04/13/12  4:50 AM      Result Value Range   WBC 10.7 (*) 4.0 - 10.5 K/uL   RBC 4.20  3.87 - 5.11 MIL/uL   Hemoglobin 11.6 (*) 12.0 - 15.0 g/dL   HCT 04.5 (*) 40.9 - 81.1 %   MCV 84.8   78.0 - 100.0 fL   MCH 27.6  26.0 - 34.0 pg   MCHC 32.6  30.0 - 36.0 g/dL   RDW 91.4  78.2 - 95.6 %   Platelets 279  150 - 400 K/uL  GLUCOSE, CAPILLARY     Status: Abnormal   Collection Time    04/13/12  8:00 AM      Result Value Range   Glucose-Capillary 300 (*) 70 - 99 mg/dL  GLUCOSE, CAPILLARY     Status: Abnormal   Collection Time    04/13/12 12:38 PM      Result Value Range   Glucose-Capillary 251 (*) 70 - 99 mg/dL  GLUCOSE, CAPILLARY     Status: Abnormal   Collection Time    04/13/12  5:56 PM      Result Value Range   Glucose-Capillary 259 (*) 70 - 99 mg/dL    Mr Foot Right Wo Contrast  04/13/2012  *RADIOLOGY REPORT*  Clinical Data: Cellulitis a soft tissue ulceration of the fourth toe.  MRI OF THE RIGHT FOREFOOT WITHOUT CONTRAST  Technique:  Multiplanar, multisequence MR imaging was performed. No intravenous contrast was administered.  Comparison: Radiographs dated 04/11/2012  Findings: There is partial destruction of the distal aspect of the middle phalangeal bone of the fourth toe with a joint effusion and fluid under the periosteum of the distal aspect of the proximal phalangeal bone.  These findings are consistent with osteomyelitis.  There is slight edema in the head and distal shaft of the fourth metatarsal but there is no joint effusion or bone destruction. This is probably due to hyperemia.  The remainder of the forefoot demonstrates no acute abnormalities.  Degenerative changes at the first metatarsal phalangeal joint and at the tarsometatarsal joints.  IMPRESSION:   Cellulitis and osteomyelitis of the fourth toe.  This is centered at the DIP joint of the toe but appears to involve the distal aspect of the proximal phalangeal bone as well.   Original Report Authenticated By: Francene Boyers, M.D.     Review of Systems  All other systems reviewed and are negative.   Blood pressure 130/68, pulse 63, temperature 97.6 F (36.4 C), temperature source Oral, resp. rate 18, height  5\' 8"  (1.727 m), weight 73.483 kg (162 lb), SpO2 97.00%. Physical Exam Patient has a faintly palpable dorsalis pedis pulse. She has  abscess osteomyelitis swelling of the right foot fourth toe. She also has venous stasis changes in her legs. Assessment/Plan: Assessment: Abscess osteomyelitis ulceration right foot fourth toe.  Plan: Will plan for a right foot fourth ray amputation. Risks and benefits were discussed including persistent infection nonhealing of the wound need for higher level amputation. Patient states she understands and wished to proceed at this time.  [name] V 04/13/2012, 6:52 PM

## 2012-04-13 NOTE — Progress Notes (Addendum)
ANTIBIOTIC CONSULT NOTE - Follow-up  Pharmacy Consult for Vanco/zosyn Indication: Osteo  Allergies  Allergen Reactions  . Ephedrine   . Morphine Other (See Comments)    Difficulty waking up & talking after surgery  . Rocephin (Ceftriaxone Sodium In Dextrose)     Developed acute resp distress, wheezing after 2nd dose.   Vital Signs: Temp: 98 F (36.7 C) (03/20 0600) Temp src: Oral (03/20 0600) BP: 144/60 mmHg (03/20 0600) Pulse Rate: 61 (03/20 0600)  Labs:  Recent Labs  04/11/12 1755 04/12/12 0506 04/13/12 0450  WBC 16.6* 14.2* 10.7*  HGB 12.4 12.8 11.6*  PLT 277 304 279  CREATININE 1.60* 1.64* 1.96*   Medical History: Past Medical History  Diagnosis Date  . Myocardial infarction   . Coronary artery disease   . Hypertension   . Dysrhythmia   . Peripheral vascular disease   . Hypothyroidism   . Diabetes mellitus   . Presbyesophagus 04/23/2011  . Urinary retention with incomplete bladder emptying 04/23/2011  . UTI (lower urinary tract infection) 04/21/2011  . Diastolic CHF, chronic     Grade I diastolic dysfunction on Echo 04/26/11  . Anaphylaxis 04/25/2011    Thought to be from Rocephin    Medications:  Anti-infectives   Start     Dose/Rate Route Frequency Ordered Stop   04/12/12 0200  piperacillin-tazobactam (ZOSYN) IVPB 3.375 g     3.375 g 12.5 mL/hr over 240 Minutes Intravenous Every 8 hours 04/12/12 0059     04/11/12 2000  vancomycin (VANCOCIN) 750 mg in sodium chloride 0.9 % 150 mL IVPB     750 mg 150 mL/hr over 60 Minutes Intravenous Every 24 hours 04/11/12 1908     04/11/12 1730  clindamycin (CLEOCIN) capsule 300 mg     300 mg Oral  Once 04/11/12 1729 04/11/12 1738     Assessment: 79 yo F presents to ER 04/11/12 with right 4th toe which is black with brownish discharge. Pt has hx of DM. Started on Vanco per Rx for osteo.   3/18 >> Clinda x1 3/18/ >> Vanco >>  3/19>>Zosyn  Tmax: AF WBCs: 10.7 < 16.6 Renal: SCr= 1.96, CrCl~22N  3/18 foot wound  cx: mod staph aureus (susc pending)  Dose changes/drug level info:  3/20: Vanco trough @1930  =  ___  Mcg/ml prior to 3rd dose of 750mg  IV q24h  Goal of Therapy:  Vancomycin trough level 15-20 mcg/ml  Plan:   SCr is trending up, will hold vancomycin and check trough tonight (expect it will be elevated)  Change zosyn to 2.25gm IV q6h for CrCl<20  Juliette Alcide, PharmD, BCPS.   Pager: [phone]  04/13/2012,9:01 AM

## 2012-04-14 ENCOUNTER — Telehealth (HOSPITAL_COMMUNITY): Payer: Self-pay | Admitting: Emergency Medicine

## 2012-04-14 ENCOUNTER — Encounter (HOSPITAL_COMMUNITY): Payer: Self-pay | Admitting: Anesthesiology

## 2012-04-14 ENCOUNTER — Encounter (HOSPITAL_COMMUNITY)
Admission: EM | Disposition: A | Discharge status: Skilled Nursing Facility | Payer: Self-pay | Source: Home / Self Care | Attending: Internal Medicine

## 2012-04-14 ENCOUNTER — Inpatient Hospital Stay (HOSPITAL_COMMUNITY): Payer: Medicare Other | Admitting: Anesthesiology

## 2012-04-14 ENCOUNTER — Encounter (HOSPITAL_COMMUNITY): Payer: Self-pay | Admitting: Certified Registered"

## 2012-04-14 DIAGNOSIS — M86179 Other acute osteomyelitis, unspecified ankle and foot: Secondary | ICD-10-CM

## 2012-04-14 DIAGNOSIS — E1149 Type 2 diabetes mellitus with other diabetic neurological complication: Secondary | ICD-10-CM

## 2012-04-14 HISTORY — PX: AMPUTATION: SHX166

## 2012-04-14 LAB — BASIC METABOLIC PANEL
BUN: 28 mg/dL — ABNORMAL HIGH (ref 6–23)
CO2: 26 mEq/L (ref 19–32)
Chloride: 97 mEq/L (ref 96–112)
Creatinine, Ser: 1.65 mg/dL — ABNORMAL HIGH (ref 0.50–1.10)
GFR calc Af Amer: 30 mL/min — ABNORMAL LOW (ref >90)

## 2012-04-14 LAB — WOUND CULTURE
Gram Stain: NONE SEEN
Special Requests: NORMAL

## 2012-04-14 LAB — CBC
HCT: 35.8 % — ABNORMAL LOW (ref 36.0–46.0)
MCH: 27.8 pg (ref 26.0–34.0)
MCHC: 33 g/dL (ref 30.0–36.0)
MCV: 84.4 fL (ref 78.0–100.0)
RDW: 13 % (ref 11.5–15.5)

## 2012-04-14 LAB — GLUCOSE, CAPILLARY
Glucose-Capillary: 196 mg/dL — ABNORMAL HIGH (ref 70–99)
Glucose-Capillary: 204 mg/dL — ABNORMAL HIGH (ref 70–99)
Glucose-Capillary: 222 mg/dL — ABNORMAL HIGH (ref 70–99)
Glucose-Capillary: 223 mg/dL — ABNORMAL HIGH (ref 70–99)

## 2012-04-14 LAB — PROTIME-INR: INR: 1.09 (ref 0.00–1.49)

## 2012-04-14 SURGERY — AMPUTATION, FOOT, RAY
Anesthesia: Monitor Anesthesia Care | Site: Foot | Laterality: Right | Wound class: Clean Contaminated

## 2012-04-14 MED ORDER — METOCLOPRAMIDE HCL 10 MG PO TABS: 5.0000 mg | ORAL_TABLET | Freq: Three times a day (TID) | ORAL | Status: DC | PRN

## 2012-04-14 MED ORDER — WARFARIN SODIUM 4 MG PO TABS
4.0000 mg | ORAL_TABLET | Freq: Once | ORAL | Status: AC
  Administered 2012-04-14: 4 mg via ORAL
  Filled 2012-04-14: qty 1

## 2012-04-14 MED ORDER — INSULIN GLARGINE 100 UNIT/ML ~~LOC~~ SOLN
15.0000 [IU] | Freq: Every day | SUBCUTANEOUS | Status: DC
  Administered 2012-04-14 – 2012-04-15 (×2): 15 [IU] via SUBCUTANEOUS
  Filled 2012-04-14 (×3): qty 0.15

## 2012-04-14 MED ORDER — ONDANSETRON HCL 4 MG PO TABS: 4.0000 mg | ORAL_TABLET | Freq: Four times a day (QID) | ORAL | Status: DC | PRN

## 2012-04-14 MED ORDER — ONDANSETRON HCL 4 MG/2ML IJ SOLN: 4.0000 mg | Freq: Four times a day (QID) | INTRAMUSCULAR | Status: DC | PRN

## 2012-04-14 MED ORDER — LACTATED RINGERS IV SOLN
INTRAVENOUS | Status: DC | PRN
  Administered 2012-04-14: 18:00:00 via INTRAVENOUS

## 2012-04-14 MED ORDER — PROPOFOL 10 MG/ML IV EMUL
INTRAVENOUS | Status: DC | PRN
  Administered 2012-04-14: 50 ug/kg/min via INTRAVENOUS

## 2012-04-14 MED ORDER — WARFARIN - PHARMACIST DOSING INPATIENT: Freq: Every day | Status: DC

## 2012-04-14 MED ORDER — METOCLOPRAMIDE HCL 5 MG/ML IJ SOLN: 5.0000 mg | Freq: Three times a day (TID) | INTRAMUSCULAR | Status: DC | PRN

## 2012-04-14 MED ORDER — LIDOCAINE HCL (PF) 1 % IJ SOLN
INTRAMUSCULAR | Status: DC | PRN
  Administered 2012-04-14: 15 mL

## 2012-04-14 MED ORDER — SODIUM CHLORIDE 0.9 % IV SOLN
INTRAVENOUS | Status: DC
  Administered 2012-04-14: 500 mL via INTRAVENOUS
  Administered 2012-04-15 – 2012-04-17 (×2): via INTRAVENOUS

## 2012-04-14 MED ORDER — LACTATED RINGERS IV SOLN
INTRAVENOUS | Status: DC
  Administered 2012-04-14: 21:00:00 via INTRAVENOUS

## 2012-04-14 MED ORDER — FENTANYL CITRATE 0.05 MG/ML IJ SOLN: 25.0000 ug | INTRAMUSCULAR | Status: DC | PRN

## 2012-04-14 SURGICAL SUPPLY — 37 items
BAG SPEC THK2 15X12 ZIP CLS (MISCELLANEOUS) ×1
BAG ZIPLOCK 12X15 (MISCELLANEOUS) ×2 IMPLANT
BANDAGE ESMARK 6X9 LF (GAUZE/BANDAGES/DRESSINGS) ×1 IMPLANT
BANDAGE GAUZE ELAST BULKY 4 IN (GAUZE/BANDAGES/DRESSINGS) ×2 IMPLANT
BLADE SURG CARB STEEL SZ22 (BLADE) ×2 IMPLANT
BNDG CMPR 9X6 STRL LF SNTH (GAUZE/BANDAGES/DRESSINGS) ×1
BNDG COHESIVE 3X5 TAN STRL LF (GAUZE/BANDAGES/DRESSINGS) ×2 IMPLANT
BNDG COHESIVE 6X5 TAN STRL LF (GAUZE/BANDAGES/DRESSINGS) ×2 IMPLANT
BNDG ESMARK 6X9 LF (GAUZE/BANDAGES/DRESSINGS) ×2
CLOTH BEACON ORANGE TIMEOUT ST (SAFETY) ×2 IMPLANT
CUFF TOURN SGL QUICK 34 (TOURNIQUET CUFF) ×2
CUFF TRNQT CYL 34X4X40X1 (TOURNIQUET CUFF) ×1 IMPLANT
DRAPE LG THREE QUARTER DISP (DRAPES) ×2 IMPLANT
DRAPE SURG 17X11 SM STRL (DRAPES) ×4 IMPLANT
DRAPE U-SHAPE 47X51 STRL (DRAPES) ×4 IMPLANT
DRSG ADAPTIC 3X8 NADH LF (GAUZE/BANDAGES/DRESSINGS) ×2 IMPLANT
DRSG PAD ABDOMINAL 8X10 ST (GAUZE/BANDAGES/DRESSINGS) ×2 IMPLANT
DURAPREP 26ML APPLICATOR (WOUND CARE) ×2 IMPLANT
ELECT REM PT RETURN 9FT ADLT (ELECTROSURGICAL) ×2
ELECTRODE REM PT RTRN 9FT ADLT (ELECTROSURGICAL) ×1 IMPLANT
GLOVE BIOGEL PI IND STRL 8.5 (GLOVE) ×1 IMPLANT
GLOVE BIOGEL PI INDICATOR 8.5 (GLOVE) ×1
GLOVE ECLIPSE 9.0 STRL (GLOVE) ×2 IMPLANT
GLOVE SURG ORTHO 9.0 STRL STRW (GLOVE) ×2 IMPLANT
GOWN PREVENTION PLUS XLARGE (GOWN DISPOSABLE) ×2 IMPLANT
KIT BASIN OR (CUSTOM PROCEDURE TRAY) ×2 IMPLANT
MANIFOLD NEPTUNE II (INSTRUMENTS) ×2 IMPLANT
NS IRRIG 1000ML POUR BTL (IV SOLUTION) ×2 IMPLANT
PACK LOWER EXTREMITY WL (CUSTOM PROCEDURE TRAY) ×2 IMPLANT
PAD CAST 4YDX4 CTTN HI CHSV (CAST SUPPLIES) ×1 IMPLANT
PADDING CAST COTTON 4X4 STRL (CAST SUPPLIES) ×2
POSITIONER SURGICAL ARM (MISCELLANEOUS) ×4 IMPLANT
SPONGE GAUZE 4X4 12PLY (GAUZE/BANDAGES/DRESSINGS) ×3 IMPLANT
STOCKINETTE 8 INCH (MISCELLANEOUS) ×2 IMPLANT
SUCTION FRAZIER TIP 10 FR DISP (SUCTIONS) ×2 IMPLANT
SUT ETHILON 2 0 PSLX (SUTURE) ×4 IMPLANT
WATER STERILE IRR 1500ML POUR (IV SOLUTION) ×2 IMPLANT

## 2012-04-14 NOTE — Progress Notes (Signed)
Inpatient Diabetes Program Recommendations  AACE/ADA: New Consensus Statement on Inpatient Glycemic Control (2013)  Target Ranges:  Prepandial:   less than 140 mg/dL      Peak postprandial:   less than 180 mg/dL (1-2 hours)      Critically ill patients:  140 - 180 mg/dL   Reason for Visit: Hyperglycemia  Results for [name], [name] (MRN [id number]) as of 04/14/2012 12:52  Ref. Range 04/13/2012 12:38 04/13/2012 17:56 04/13/2012 21:53 04/14/2012 07:51 04/14/2012 11:56  Glucose-Capillary Latest Range: 70-99 mg/dL 425 (H) 956 (H) 387 (H) 196 (H) 204 (H)    Consider Novolog moderate Q4 while NPO.   Thank you. Ailene Ards, RD, LDN, CDE Inpatient Diabetes Coordinator [phone]

## 2012-04-14 NOTE — Anesthesia Procedure Notes (Signed)
Anesthesia Regional Block:  Ankle block  Pre-Anesthetic Checklist: ,, timeout performed, Correct Patient, Correct Site, Correct Laterality, Correct Procedure, Correct Position, site marked, Risks and benefits discussed, Surgical consent, Pre-op evaluation  Laterality: Right and Lower  Prep: chloraprep       Needles:  Injection technique: Single-shot      Additional Needles: Ankle block Narrative:   Performed by: Personally  Anesthesiologist: Jaylyn Booher  Additional Notes: Right ankle block tolerated well.

## 2012-04-14 NOTE — Progress Notes (Signed)
[facility] PROGRESS NOTE  [name] JYN:[id number] DOB: Dec 01, 1919 DOA: 04/11/2012 PCP: [name], MD  Assessment/Plan: Cellulitis right lower extremity  -Continue vancomycin  -Discontinue Zosyn  Right fourth toe diabetic foot infection/Osteomyelitis  -MRI R-foot shows destruction of middle phalanx of 4th toe suggestive of osteomyelitis -X-ray right foot shows soft tissue edema with soft tissue calcification  -Appreciate Dr. [name], plans noted for ray amputation -Wound cultures obtained-->MRSA -ESR 80  diabetes mellitus type 2, poorly controlled  -Elevated sugars partly due to infection  -Continue NovoLog sliding scale for now  -Start Lantus 10 units at bedtime  Acute on chronic renal failure ,CKD stage III  -Baseline creatinine 1.3-1.6  -02/05/2012 hemoglobin A1c 10.3  -Gentle hydration  Hypertension  -Continue carvedilol, Cardizem  Coronary artery disease with history of myocardial infarction  -Continue aspirin  -Continue Imdur  Hypothyroidism  -Continue Synthroid  Chronic dysarthria  -Recent MRI brain January 2014 negative for infarct  Chronic diastolic CHF  -Compensated  -EF 60-65%  Antibiotics:  Vancomycin 04/11/2012>>>  Zosyn 04/11/2012 >>>04/12/12      Family Communication:   Pt at beside Disposition Plan:   SNF when medically stable      Procedures/Studies: Mr Foot Right Wo Contrast  04/13/2012  *RADIOLOGY REPORT*  Clinical Data: Cellulitis a soft tissue ulceration of the fourth toe.  MRI OF THE RIGHT FOREFOOT WITHOUT CONTRAST  Technique:  Multiplanar, multisequence MR imaging was performed. No intravenous contrast was administered.  Comparison: Radiographs dated 04/11/2012  Findings: There is partial destruction of the distal aspect of the middle phalangeal bone of the fourth toe with a joint effusion and fluid under the periosteum of the distal aspect of the proximal phalangeal bone.  These findings are consistent with osteomyelitis.  There is  slight edema in the head and distal shaft of the fourth metatarsal but there is no joint effusion or bone destruction. This is probably due to hyperemia.  The remainder of the forefoot demonstrates no acute abnormalities.  Degenerative changes at the first metatarsal phalangeal joint and at the tarsometatarsal joints.  IMPRESSION:   Cellulitis and osteomyelitis of the fourth toe.  This is centered at the DIP joint of the toe but appears to involve the distal aspect of the proximal phalangeal bone as well.   Original Report Authenticated By: Francene Boyers, M.D.    Dg Toe 4th Right  04/11/2012  *RADIOLOGY REPORT*  Clinical Data: Skin ulcer fourth toe  RIGHT FOURTH TOE  Comparison: 07/26/2011  Findings: Soft tissue swelling and skin ulceration of the fourth toe.  Soft tissue calcification is present adjacent to the fourth middle phalanx.  This was not present previously.  This could be related to fracture or possibly osteomyelitis.  IMPRESSION: Soft tissue swelling of the fourth toe.  There is soft tissue calcification adjacent to the ulcer and middle fourth phalanx which could be due to fracture or osteomyelitis.  This was not present previously.   Original Report Authenticated By: [name], M.D.          Subjective: Patient was much more alert today. She was feeling well today. Denies fevers, chills, chest pain, shortness breath, nausea, vomiting, diarrhea, abdominal pain. Has some mild pain in her foot.  Objective: Filed Vitals:   04/14/12 0604 04/14/12 0807 04/14/12 1036 04/14/12 1403  BP: 146/62 147/68 162/64 165/77  Pulse: 62 64 66 63  Temp: 97.6 F (36.4 C) 97.1 F (36.2 C) 97.6 F (36.4 C) 97.9 F (36.6 C)  TempSrc: Oral Oral Oral  Oral  Resp: 18 20 18    Height:      Weight:      SpO2: 93% 92% 95% 96%    Intake/Output Summary (Last 24 hours) at 04/14/12 1632 Last data filed at 04/14/12 1420  Gross per 24 hour  Intake    150 ml  Output   1250 ml  Net  -1100 ml   Weight  change:  Exam:   General:  [name] alert, follows commands appropriately, not in acute distress  HEENT: No icterus, No thrush,  [place]/AT  Cardiovascular: RRR, S1/S2, no rubs, no gallops  Respiratory: CTA bilaterally, no wheezing, no crackles, no rhonchi  Abdomen: Soft/+BS, non tender, non distended, no guarding  Extremities: Right fourth toe with erythema and distal necrosis. No crepitance. Small amount of purulent drainage.  Data Reviewed: Basic Metabolic Panel:  Recent Labs Lab 04/11/12 1755 04/12/12 0506 04/13/12 0450 04/14/12 0515  NA 133* 134* 132* 132*  K 4.4 3.3* 4.3 4.3  CL 96 95* 95* 97  CO2 27 27 25 26   GLUCOSE 205* 105* 266* 217*  BUN 23 22 30* 28*  CREATININE 1.60* 1.64* 1.96* 1.65*  CALCIUM 9.3 9.4 8.8 8.7   Liver Function Tests: No results found for this basename: AST, ALT, ALKPHOS, BILITOT, PROT, ALBUMIN,  in the last 168 hours No results found for this basename: LIPASE, AMYLASE,  in the last 168 hours No results found for this basename: AMMONIA,  in the last 168 hours CBC:  Recent Labs Lab 04/11/12 1755 04/12/12 0506 04/13/12 0450 04/14/12 0515  WBC 16.6* 14.2* 10.7* 8.9  NEUTROABS  --  10.8*  --   --   HGB 12.4 12.8 11.6* 11.8*  HCT 38.7 39.1 35.6* 35.8*  MCV 86.2 85.2 84.8 84.4  PLT 277 304 279 328   Cardiac Enzymes: No results found for this basename: CKTOTAL, CKMB, CKMBINDEX, TROPONINI,  in the last 168 hours BNP: No components found with this basename: POCBNP,  CBG:  Recent Labs Lab 04/13/12 1238 04/13/12 1756 04/13/12 2153 04/14/12 0751 04/14/12 1156  GLUCAP 251* 259* 333* 196* 204*    Recent Results (from the past 240 hour(s))  WOUND CULTURE     Status: None   Collection Time    04/11/12  8:07 PM      Result Value Range Status   Specimen Description FOOT   Final   Special Requests Normal   Final   Gram Stain     Final   Value: NO WBC SEEN     RARE SQUAMOUS EPITHELIAL CELLS PRESENT     FEW GRAM POSITIVE COCCI IN PAIRS      IN CLUSTERS   Culture     Final   Value: MODERATE METHICILLIN RESISTANT STAPHYLOCOCCUS AUREUS     Note: RIFAMPIN AND GENTAMICIN SHOULD NOT BE USED AS SINGLE DRUGS FOR TREATMENT OF STAPH INFECTIONS. CRITICAL RESULT CALLED TO, READ BACK BY AND VERIFIED WITH: ESTER E 04/14/12 AT 900 AM BY [facility]   Report Status 04/14/2012 FINAL   Final   Organism ID, Bacteria METHICILLIN RESISTANT STAPHYLOCOCCUS AUREUS   Final     Scheduled Meds: . aspirin EC  325 mg Oral Daily  . calcium carbonate  1 tablet Oral Daily  . carvedilol  6.25 mg Oral BID WC  . diltiazem  180 mg Oral QHS  . gabapentin  300 mg Oral QHS  . heparin  5,000 Units Subcutaneous Q8H  . insulin aspart  0-15 Units Subcutaneous TID WC  . insulin  aspart  0-5 Units Subcutaneous QHS  . insulin glargine  10 Units Subcutaneous QHS  . isosorbide mononitrate  30 mg Oral Daily  . levothyroxine  200 mcg Oral q morning - 10a  . multivitamin with minerals  1 tablet Oral Daily  . pantoprazole  40 mg Oral Daily  . polycarbophil  1,250 mg Oral Daily  . protein supplement  1 scoop Oral TID WC  . senna  1 tablet Oral BID  . sodium chloride  3 mL Intravenous Q12H  . tamsulosin  0.4 mg Oral q morning - 10a  . vancomycin  750 mg Intravenous Q24H   Continuous Infusions: . sodium chloride    . sodium chloride 500 mL (04/14/12 1157)     Cassidey Barrales, DO  [facility] Pager [phone]  If 7PM-7AM, please contact night-coverage [email or web address] Password [facility] 04/14/2012, 4:32 PM   LOS: 3 days

## 2012-04-14 NOTE — Progress Notes (Signed)
ANTICOAGULATION CONSULT NOTE - Initial Consult  Pharmacy Consult for Warfarin Indication: VTE prophylaxis  Allergies  Allergen Reactions  . Ephedrine   . Morphine Other (See Comments)    Difficulty waking up & talking after surgery  . Rocephin (Ceftriaxone Sodium In Dextrose)     Developed acute resp distress, wheezing after 2nd dose.    Patient Measurements: Height: 5\' 8"  (172.7 cm) Weight: 162 lb (73.483 kg) IBW/kg (Calculated) : 63.9    Vital Signs: Temp: 97.7 F (36.5 C) (03/21 1958) Temp src: Oral (03/21 1659) BP: 193/49 mmHg (03/21 1945) Pulse Rate: 67 (03/21 1945)  Labs:  Recent Labs  04/12/12 0506 04/13/12 0450 04/14/12 0515  HGB 12.8 11.6* 11.8*  HCT 39.1 35.6* 35.8*  PLT 304 279 328  CREATININE 1.64* 1.96* 1.65*    Estimated Creatinine Clearance: 22.9 ml/min (by C-G formula based on Cr of 1.65).   Medical History: Past Medical History  Diagnosis Date  . Myocardial infarction   . Coronary artery disease   . Hypertension   . Dysrhythmia   . Peripheral vascular disease   . Hypothyroidism   . Diabetes mellitus   . Presbyesophagus 04/23/2011  . Urinary retention with incomplete bladder emptying 04/23/2011  . UTI (lower urinary tract infection) 04/21/2011  . Diastolic CHF, chronic     Grade I diastolic dysfunction on Echo 04/26/11  . Anaphylaxis 04/25/2011    Thought to be from Rocephin    Medications:  Scheduled:  . aspirin EC  325 mg Oral Daily  . calcium carbonate  1 tablet Oral Daily  . carvedilol  6.25 mg Oral BID WC  . diltiazem  180 mg Oral QHS  . gabapentin  300 mg Oral QHS  . heparin  5,000 Units Subcutaneous Q8H  . insulin aspart  0-15 Units Subcutaneous TID WC  . insulin aspart  0-5 Units Subcutaneous QHS  . insulin glargine  15 Units Subcutaneous QHS  . isosorbide mononitrate  30 mg Oral Daily  . levothyroxine  200 mcg Oral q morning - 10a  . multivitamin with minerals  1 tablet Oral Daily  . pantoprazole  40 mg Oral Daily  .  polycarbophil  1,250 mg Oral Daily  . protein supplement  1 scoop Oral TID WC  . senna  1 tablet Oral BID  . sodium chloride  3 mL Intravenous Q12H  . tamsulosin  0.4 mg Oral q morning - 10a  . vancomycin  750 mg Intravenous Q24H  . [DISCONTINUED] insulin glargine  10 Units Subcutaneous QHS   Infusions:  . sodium chloride    . sodium chloride 500 mL (04/14/12 1157)  . lactated ringers      Assessment: 89 s/p right foot 4th toe ray amputation.  MD ordering Warfarin for DVT prophylaxis.  Goal of Therapy:  INR 2-3    Plan:   Baseline coags now.  Warfarin 4mg  x1 tonight  Daily PT/INR  Education.  Lorenza Evangelist 04/14/2012,8:02 PM

## 2012-04-14 NOTE — Anesthesia Preprocedure Evaluation (Addendum)
Anesthesia Evaluation  Patient identified by MRN, date of birth, ID band Patient awake    Reviewed: Allergy & Precautions, H&P , NPO status , Patient's Chart, lab work & pertinent test results  Airway Mallampati: II TM Distance: >3 FB Neck ROM: Full    Dental no notable dental hx.    Pulmonary neg pulmonary ROS,  breath sounds clear to auscultation  Pulmonary exam normal       Cardiovascular Exercise Tolerance: Poor hypertension, Pt. on medications and Pt. on home beta blockers + CAD, + Past MI, + Peripheral Vascular Disease and +CHF + dysrhythmias Rhythm:Regular Rate:Normal     Neuro/Psych negative neurological ROS  negative psych ROS   GI/Hepatic negative GI ROS, Neg liver ROS,   Endo/Other  diabetes, Type 2, Insulin DependentHypothyroidism   Renal/GU Renal InsufficiencyRenal diseaseChronic kidney disease, stage 3  negative genitourinary   Musculoskeletal negative musculoskeletal ROS (+)   Abdominal   Peds negative pediatric ROS (+)  Hematology  (+) Blood dyscrasia, ,   Anesthesia Other Findings   Reproductive/Obstetrics negative OB ROS                           Anesthesia Physical Anesthesia Plan  ASA: III  Anesthesia Plan: MAC   Post-op Pain Management:    Induction: Intravenous  Airway Management Planned:   Additional Equipment:   Intra-op Plan:   Post-operative Plan:   Informed Consent: I have reviewed the patients History and Physical, chart, labs and discussed the procedure including the risks, benefits and alternatives for the proposed anesthesia with the patient or authorized representative who has indicated his/her understanding and acceptance.   Dental advisory given  Plan Discussed with: CRNA  Anesthesia Plan Comments: (Toe/ankle  block with GA backup.)       Anesthesia Quick Evaluation

## 2012-04-14 NOTE — Transfer of Care (Signed)
Immediate Anesthesia Transfer of Care Note  Patient: [name]  Procedure(s) Performed: Procedure(s) with comments: AMPUTATION RAY (Right) - ankle block  Patient Location: PACU  Anesthesia Type:General  Level of Consciousness: awake, alert , patient cooperative and responds to stimulation  Airway & Oxygen Therapy: Patient Spontanous Breathing and Patient connected to face mask oxygen  Post-op Assessment: Report given to PACU RN, Post -op Vital signs reviewed and stable and Patient moving all extremities X 4  Post vital signs: Reviewed and stable  Complications: No apparent anesthesia complications

## 2012-04-14 NOTE — Anesthesia Postprocedure Evaluation (Signed)
  Anesthesia Post-op Note  Patient: [name]  Procedure(s) Performed: Procedure(s) (LRB): AMPUTATION RAY (Right)  Patient Location: PACU  Anesthesia Type: Regional, ankle block  Level of Consciousness: awake and alert   Airway and Oxygen Therapy: Patient Spontanous Breathing  Post-op Pain: mild  Post-op Assessment: Post-op Vital signs reviewed, Patient's Cardiovascular Status Stable, Respiratory Function Stable, Patent Airway and No signs of Nausea or vomiting  Last Vitals:  Filed Vitals:   04/14/12 1659  BP: 176/69  Pulse: 67  Temp: 36.7 C  Resp: 18    Post-op Vital Signs: stable   Complications: No apparent anesthesia complications

## 2012-04-14 NOTE — Op Note (Signed)
OPERATIVE REPORT  DATE OF SURGERY: 04/14/2012  PATIENT:  [name],  79 y.o. female  PRE-OPERATIVE DIAGNOSIS:  Right Foot 4th toe ray amputation  POST-OPERATIVE DIAGNOSIS:  Right Foot 4th toe ray amputation  PROCEDURE:  Procedure(s): AMPUTATION RAY  SURGEON:  Surgeon(s): [name], MD  ANESTHESIA:   regional  EBL:  min ML  SPECIMEN:  No Specimen  TOURNIQUET:    PROCEDURE DETAILS: Patient is a 79 year old woman with osteomyelitis abscess and ulceration right foot fourth toe. She presents at this time for a fourth ray amputation due to failure of conservative treatment. Risks and benefits were discussed including nonhealing of the wound and potential for higher level amputation. Patient states she understands and wishes to proceed at this time. Description of procedure patient brought to the operating room and underwent a ankle block. After adequate levels of anesthesia were obtained patient's right lower extremity was prepped using DuraPrep draped into a sterile field. A racquet incision was made around the toe through a dorsal incision. The metatarsal was resected through the shaft of the metatarsal. The metatarsal and toe were resected in one block of tissue. The wound was irrigated with normal saline. There is no signs of any infection within the surgical field. The incision was closed using 2-0 nylon. The wound was covered with Adaptic orthopedic sponges AB dressing Kerlix and Coban. Patient was taken to the PACU in stable condition.  PLAN OF CARE: Admit to inpatient   PATIENT DISPOSITION:  PACU - hemodynamically stable.   [name], MD 04/14/2012 6:49 PM

## 2012-04-15 DIAGNOSIS — E039 Hypothyroidism, unspecified: Secondary | ICD-10-CM

## 2012-04-15 LAB — GLUCOSE, CAPILLARY: Glucose-Capillary: 253 mg/dL — ABNORMAL HIGH (ref 70–99)

## 2012-04-15 LAB — BASIC METABOLIC PANEL
BUN: 17 mg/dL (ref 6–23)
Creatinine, Ser: 1.25 mg/dL — ABNORMAL HIGH (ref 0.50–1.10)
GFR calc Af Amer: 43 mL/min — ABNORMAL LOW (ref >90)
GFR calc non Af Amer: 37 mL/min — ABNORMAL LOW (ref >90)
Glucose, Bld: 253 mg/dL — ABNORMAL HIGH (ref 70–99)
Potassium: 3.9 mEq/L (ref 3.5–5.1)

## 2012-04-15 LAB — CBC
Hemoglobin: 11.3 g/dL — ABNORMAL LOW (ref 12.0–15.0)
MCH: 26.8 pg (ref 26.0–34.0)
MCHC: 31.5 g/dL (ref 30.0–36.0)
MCV: 85.1 fL (ref 78.0–100.0)
Platelets: 304 10*3/uL (ref 150–400)
RBC: 4.22 MIL/uL (ref 3.87–5.11)

## 2012-04-15 LAB — VANCOMYCIN, TROUGH: Vancomycin Tr: 13 ug/mL (ref 10.0–20.0)

## 2012-04-15 MED ORDER — VANCOMYCIN HCL IN DEXTROSE 1-5 GM/200ML-% IV SOLN
1000.0000 mg | INTRAVENOUS | Status: DC
  Administered 2012-04-16: 1000 mg via INTRAVENOUS
  Filled 2012-04-15 (×2): qty 200

## 2012-04-15 NOTE — Progress Notes (Signed)
[facility] PROGRESS NOTE  JEANETTE RAUTH WUJ:[id number] DOB: 12/29/1919 DOA: 04/11/2012 PCP: [name], MD  Assessment/Plan: Cellulitis right lower extremity  -Continue vancomycin  -Discontinue Zosyn  Right fourth toe diabetic foot infection/Osteomyelitis  -MRI R-foot shows destruction of middle phalanx of 4th toe suggestive of osteomyelitis  -X-ray right foot shows soft tissue edema with soft tissue calcification  -Appreciate Dr. [name] -s/p right fourth ray amputation 04/14/2012 -Wound cultures obtained-->MRSA  -ESR 80  diabetes mellitus type 2, poorly controlled  -Elevated sugars partly due to infection  -Continue NovoLog sliding scale for now  -Start Lantus 15 units at bedtime  Acute on chronic renal failure ,CKD stage III  -Baseline creatinine 1.3-1.6  -02/05/2012 hemoglobin A1c 10.3  -Continue Gentle hydration  Hypertension  -Continue carvedilol, Cardizem  Coronary artery disease with history of myocardial infarction  -Continue aspirin  -Continue Imdur  Hypothyroidism  -Continue Synthroid  Chronic dysarthria  -Recent MRI brain January 2014 negative for infarct  Chronic diastolic CHF  -Compensated  -EF 60-65%  Deconditioning -Continue PT -Plan for skilled nursing facility Antibiotics:  Vancomycin 04/11/2012>>>  Zosyn 04/11/2012 >>>04/12/12    Family Communication:   Pt at beside Disposition Plan:   SNF when medically stable      Procedures/Studies: Mr Foot Right Wo Contrast  04/13/2012  *RADIOLOGY REPORT*  Clinical Data: Cellulitis a soft tissue ulceration of the fourth toe.  MRI OF THE RIGHT FOREFOOT WITHOUT CONTRAST  Technique:  Multiplanar, multisequence MR imaging was performed. No intravenous contrast was administered.  Comparison: Radiographs dated 04/11/2012  Findings: There is partial destruction of the distal aspect of the middle phalangeal bone of the fourth toe with a joint effusion and fluid under the periosteum of the distal aspect of the  proximal phalangeal bone.  These findings are consistent with osteomyelitis.  There is slight edema in the head and distal shaft of the fourth metatarsal but there is no joint effusion or bone destruction. This is probably due to hyperemia.  The remainder of the forefoot demonstrates no acute abnormalities.  Degenerative changes at the first metatarsal phalangeal joint and at the tarsometatarsal joints.  IMPRESSION:   Cellulitis and osteomyelitis of the fourth toe.  This is centered at the DIP joint of the toe but appears to involve the distal aspect of the proximal phalangeal bone as well.   Original Report Authenticated By: Francene Boyers, M.D.    Dg Toe 4th Right  04/11/2012  *RADIOLOGY REPORT*  Clinical Data: Skin ulcer fourth toe  RIGHT FOURTH TOE  Comparison: 07/26/2011  Findings: Soft tissue swelling and skin ulceration of the fourth toe.  Soft tissue calcification is present adjacent to the fourth middle phalanx.  This was not present previously.  This could be related to fracture or possibly osteomyelitis.  IMPRESSION: Soft tissue swelling of the fourth toe.  There is soft tissue calcification adjacent to the ulcer and middle fourth phalanx which could be due to fracture or osteomyelitis.  This was not present previously.   Original Report Authenticated By: [name], M.D.          Subjective: Patient is feeling well. She denies fevers, chills, chest pain, shortness of breath, nausea, vomiting, diarrhea, abdominal pain, dizziness.   Objective: Filed Vitals:   04/14/12 1958 04/14/12 2015 04/14/12 2220 04/15/12 0620  BP:  186/95 183/76 140/82  Pulse:  63 67 58  Temp: 97.7 F (36.5 C) 98.2 F (36.8 C) 98.2 F (36.8 C) 97.7 F (36.5 C)  TempSrc:   Oral  Oral  Resp:  20 18 17   Height:      Weight:      SpO2:  97% 96% 95%    Intake/Output Summary (Last 24 hours) at 04/15/12 0913 Last data filed at 04/14/12 2240  Gross per 24 hour  Intake    900 ml  Output    300 ml  Net    600  ml   Weight change:  Exam:   General:  [name] alert, follows commands appropriately, not in acute distress  HEENT: No icterus, No thrush, No neck mass, [place]/AT  Cardiovascular: RRR, S1/S2, no rubs, no gallops  Respiratory: CTA bilaterally, no wheezing, no crackles, no rhonchi  Abdomen: Soft/+BS, non tender, non distended, no guarding  Extremities: trace edema right lower extremity, No lymphangitis, No petechiae, No rashes, no synovitis; right foot is wrapped  Data Reviewed: Basic Metabolic Panel:  Recent Labs Lab 04/11/12 1755 04/12/12 0506 04/13/12 0450 04/14/12 0515 04/15/12 0525  NA 133* 134* 132* 132* 133*  K 4.4 3.3* 4.3 4.3 3.9  CL 96 95* 95* 97 97  CO2 27 27 25 26 26   GLUCOSE 205* 105* 266* 217* 253*  BUN 23 22 30* 28* 17  CREATININE 1.60* 1.64* 1.96* 1.65* 1.25*  CALCIUM 9.3 9.4 8.8 8.7 9.0  MG  --   --   --   --  1.5   Liver Function Tests: No results found for this basename: AST, ALT, ALKPHOS, BILITOT, PROT, ALBUMIN,  in the last 168 hours No results found for this basename: LIPASE, AMYLASE,  in the last 168 hours No results found for this basename: AMMONIA,  in the last 168 hours CBC:  Recent Labs Lab 04/11/12 1755 04/12/12 0506 04/13/12 0450 04/14/12 0515 04/15/12 0525  WBC 16.6* 14.2* 10.7* 8.9 7.3  NEUTROABS  --  10.8*  --   --   --   HGB 12.4 12.8 11.6* 11.8* 11.3*  HCT 38.7 39.1 35.6* 35.8* 35.9*  MCV 86.2 85.2 84.8 84.4 85.1  PLT 277 304 279 328 304   Cardiac Enzymes: No results found for this basename: CKTOTAL, CKMB, CKMBINDEX, TROPONINI,  in the last 168 hours BNP: No components found with this basename: POCBNP,  CBG:  Recent Labs Lab 04/14/12 0751 04/14/12 1156 04/14/12 1644 04/14/12 2216 04/15/12 0719  GLUCAP 196* 204* 222* 223* 224*    Recent Results (from the past 240 hour(s))  WOUND CULTURE     Status: None   Collection Time    04/11/12  8:07 PM      Result Value Range Status   Specimen Description FOOT   Final    Special Requests Normal   Final   Gram Stain     Final   Value: NO WBC SEEN     RARE SQUAMOUS EPITHELIAL CELLS PRESENT     FEW GRAM POSITIVE COCCI IN PAIRS     IN CLUSTERS   Culture     Final   Value: MODERATE METHICILLIN RESISTANT STAPHYLOCOCCUS AUREUS     Note: RIFAMPIN AND GENTAMICIN SHOULD NOT BE USED AS SINGLE DRUGS FOR TREATMENT OF STAPH INFECTIONS. CRITICAL RESULT CALLED TO, READ BACK BY AND VERIFIED WITH: ESTER E 04/14/12 AT 900 AM BY [facility]   Report Status 04/14/2012 FINAL   Final   Organism ID, Bacteria METHICILLIN RESISTANT STAPHYLOCOCCUS AUREUS   Final     Scheduled Meds: . aspirin EC  325 mg Oral Daily  . calcium carbonate  1 tablet Oral Daily  . carvedilol  6.25 mg Oral BID WC  . diltiazem  180 mg Oral QHS  . gabapentin  300 mg Oral QHS  . heparin  5,000 Units Subcutaneous Q8H  . insulin aspart  0-15 Units Subcutaneous TID WC  . insulin aspart  0-5 Units Subcutaneous QHS  . insulin glargine  15 Units Subcutaneous QHS  . isosorbide mononitrate  30 mg Oral Daily  . levothyroxine  200 mcg Oral q morning - 10a  . multivitamin with minerals  1 tablet Oral Daily  . pantoprazole  40 mg Oral Daily  . polycarbophil  1,250 mg Oral Daily  . protein supplement  1 scoop Oral TID WC  . senna  1 tablet Oral BID  . sodium chloride  3 mL Intravenous Q12H  . tamsulosin  0.4 mg Oral q morning - 10a  . vancomycin  750 mg Intravenous Q24H   Continuous Infusions: . sodium chloride    . sodium chloride 500 mL (04/14/12 1157)  . lactated ringers 125 mL/hr at 04/14/12 2045     Kanav Kazmierczak, DO  [facility] Pager [phone]  If 7PM-7AM, please contact night-coverage [email or web address] Password [facility] 04/15/2012, 9:13 AM   LOS: 4 days

## 2012-04-15 NOTE — Progress Notes (Addendum)
Clinical Social Work Department CLINICAL SOCIAL WORK PLACEMENT NOTE 04/15/2012  Patient:  [name], [name]  Account Number:  [id number] Admit date:  04/11/2012  Clinical Social Worker:  Doroteo Glassman  Date/time:  04/15/2012 11:43 AM  Clinical Social Work is seeking post-discharge placement for this patient at the following level of care:   SKILLED NURSING   (*CSW will update this form in Epic as items are completed)   04/15/2012  Patient/family provided with [facility] Department of Clinical Social Work's list of facilities offering this level of care within the geographic area requested by the patient (or if unable, by the patient's family).  04/15/2012  Patient/family informed of their freedom to choose among providers that offer the needed level of care, that participate in Medicare, Medicaid or managed care program needed by the patient, have an available bed and are willing to accept the patient.  04/15/2012  Patient/family informed of [facility]' ownership interest in [facility], as well as of the fact that they are under no obligation to receive care at this facility.  PASARR submitted to EDS on existing PASARR number received from EDS on existing  FL2 transmitted to all facilities in geographic area requested by pt/family on  04/15/2012 FL2 transmitted to all facilities within larger geographic area on   Patient informed that his/her managed care company has contracts with or will negotiate with  certain facilities, including the following:     Patient/family informed of bed offers received:  04/17/12 Patient chooses bed at [facility] Physician recommends and patient chooses bed at    [facility]  [facility]/12 Patient to be transferred to facility by [facility]  The following physician request were entered in Epic:   Additional Comments:

## 2012-04-15 NOTE — Progress Notes (Signed)
Patient ID: [name], female   DOB: 07/10/1919, 79 y.o.   MRN: [id number] Subjective: 1 Day Post-Op Procedure(s) (LRB): AMPUTATION RAY (Right) Awake, alert and Ox4. Somewhat confused. Patient reports pain as mild.    Objective:   VITALS:  Temp:  [97.7 F (36.5 C)-98.2 F (36.8 C)] 98.1 F (36.7 C) (03/22 1430) Pulse Rate:  [58-73] 73 (03/22 1430) Resp:  [17-21] 18 (03/22 1430) BP: (140-193)/(47-95) 164/65 mmHg (03/22 1430) SpO2:  [92 %-100 %] 92 % (03/22 1430)  Neurologically intact ABD soft Dorsiflexion/Plantar flexion intact Incision: dressing C/D/I   LABS  Recent Labs  04/13/12 0450 04/14/12 0515 04/15/12 0525  HGB 11.6* 11.8* 11.3*  WBC 10.7* 8.9 7.3  PLT 279 328 304    Recent Labs  04/14/12 0515 04/15/12 0525  NA 132* 133*  K 4.3 3.9  CL 97 97  CO2 26 26  BUN 28* 17  CREATININE 1.65* 1.25*  GLUCOSE 217* 253*    Recent Labs  04/14/12 2030 04/15/12 0525  INR 1.09 1.09     Assessment/Plan: 1 Day Post-Op Procedure(s) (LRB): AMPUTATION RAY (Right)  Advance diet Up with therapy D/C IV fluids Discharge to SNF Dr. [name] requested nonweight bearing right leg  Ralphine Hinks E 04/15/2012, 2:37 PM

## 2012-04-15 NOTE — Progress Notes (Signed)
ANTIBIOTIC CONSULT NOTE - Follow-Up  Pharmacy Consult for Vanco Indication: Osteo  Allergies  Allergen Reactions  . Ephedrine   . Morphine Other (See Comments)    Difficulty waking up & talking after surgery  . Rocephin (Ceftriaxone Sodium In Dextrose)     Developed acute resp distress, wheezing after 2nd dose.   Vital Signs: Temp: 97.7 F (36.5 C) (03/22 0620) Temp src: Oral (03/22 0620) BP: 140/82 mmHg (03/22 0620) Pulse Rate: 58 (03/22 0620)  Labs:  Recent Labs  04/13/12 0450 04/14/12 0515 04/15/12 0525  WBC 10.7* 8.9 7.3  HGB 11.6* 11.8* 11.3*  PLT 279 328 304  CREATININE 1.96* 1.65* 1.25*    Medications:  Anti-infectives   Start     Dose/Rate Route Frequency Ordered Stop   04/13/12 2100  vancomycin (VANCOCIN) 750 mg in sodium chloride 0.9 % 150 mL IVPB     750 mg 150 mL/hr over 60 Minutes Intravenous Every 24 hours 04/13/12 2008     04/13/12 1200  piperacillin-tazobactam (ZOSYN) IVPB 2.25 g  Status:  Discontinued     2.25 g 100 mL/hr over 30 Minutes Intravenous 4 times per day 04/13/12 0913 04/13/12 1039   04/12/12 0200  piperacillin-tazobactam (ZOSYN) IVPB 3.375 g  Status:  Discontinued     3.375 g 12.5 mL/hr over 240 Minutes Intravenous Every 8 hours 04/12/12 0059 04/13/12 0913   04/11/12 2000  vancomycin (VANCOCIN) 750 mg in sodium chloride 0.9 % 150 mL IVPB  Status:  Discontinued     750 mg 150 mL/hr over 60 Minutes Intravenous Every 24 hours 04/11/12 1908 04/13/12 0910   04/11/12 1730  clindamycin (CLEOCIN) capsule 300 mg     300 mg Oral  Once 04/11/12 1729 04/11/12 1738     Assessment:  79 yo F admitted after presenting to ER 04/11/12 with right 4th toe which is black with brownish discharge. Pt has hx of DM. S/p toe amputation on 3/21.  D#5 Vanco 750mg  IV q24h for toe osteo  SCr is improving, will check vanco trough tonight prior to 9pm dose.  Goal of Therapy:  Vancomycin trough level 15-20 mcg/ml  Plan:   Continue vancomycin 750mg  IV  24h  Check Vanco trough tonight at 20:30  Darrol Angel, PharmD Pager: [phone] 04/15/2012 11:28 AM

## 2012-04-15 NOTE — Progress Notes (Signed)
ANTIBIOTIC CONSULT NOTE - FOLLOW UP  Pharmacy Consult for vancomycin Indication: Osteo  Allergies  Allergen Reactions  . Ephedrine   . Morphine Other (See Comments)    Difficulty waking up & talking after surgery  . Rocephin (Ceftriaxone Sodium In Dextrose)     Developed acute resp distress, wheezing after 2nd dose.    Patient Measurements: Height: 5\' 8"  (172.7 cm) Weight: 162 lb (73.483 kg) IBW/kg (Calculated) : 63.9 Adjusted Body Weight:   Vital Signs: Temp: 98.1 F (36.7 C) (03/22 1430) Temp src: Oral (03/22 1430) BP: 164/65 mmHg (03/22 1430) Pulse Rate: 73 (03/22 1430) Intake/Output from previous day: 03/21 0701 - 03/22 0700 In: 900 [P.O.:100; I.V.:800] Out: 550 [Urine:550] Intake/Output from this shift:    Labs:  Recent Labs  04/13/12 0450 04/14/12 0515 04/15/12 0525  WBC 10.7* 8.9 7.3  HGB 11.6* 11.8* 11.3*  PLT 279 328 304  CREATININE 1.96* 1.65* 1.25*   Estimated Creatinine Clearance: 30.2 ml/min (by C-G formula based on Cr of 1.25).  Recent Labs  04/13/12 1912 04/15/12 2012  VANCOTROUGH 9.9* 13.0     Microbiology: Recent Results (from the past 720 hour(s))  WOUND CULTURE     Status: None   Collection Time    04/11/12  8:07 PM      Result Value Range Status   Specimen Description FOOT   Final   Special Requests Normal   Final   Gram Stain     Final   Value: NO WBC SEEN     RARE SQUAMOUS EPITHELIAL CELLS PRESENT     FEW GRAM POSITIVE COCCI IN PAIRS     IN CLUSTERS   Culture     Final   Value: MODERATE METHICILLIN RESISTANT STAPHYLOCOCCUS AUREUS     Note: RIFAMPIN AND GENTAMICIN SHOULD NOT BE USED AS SINGLE DRUGS FOR TREATMENT OF STAPH INFECTIONS. CRITICAL RESULT CALLED TO, READ BACK BY AND VERIFIED WITH: ESTER E 04/14/12 AT 900 AM BY [facility]   Report Status 04/14/2012 FINAL   Final   Organism ID, Bacteria METHICILLIN RESISTANT STAPHYLOCOCCUS AUREUS   Final    Anti-infectives   Start     Dose/Rate Route Frequency Ordered Stop   04/16/12  1200  vancomycin (VANCOCIN) IVPB 1000 mg/200 mL premix     1,000 mg 200 mL/hr over 60 Minutes Intravenous Every 24 hours 04/15/12 2219     04/13/12 2100  vancomycin (VANCOCIN) 750 mg in sodium chloride 0.9 % 150 mL IVPB  Status:  Discontinued     750 mg 150 mL/hr over 60 Minutes Intravenous Every 24 hours 04/13/12 2008 04/15/12 2220   04/13/12 1200  piperacillin-tazobactam (ZOSYN) IVPB 2.25 g  Status:  Discontinued     2.25 g 100 mL/hr over 30 Minutes Intravenous 4 times per day 04/13/12 0913 04/13/12 1039   04/12/12 0200  piperacillin-tazobactam (ZOSYN) IVPB 3.375 g  Status:  Discontinued     3.375 g 12.5 mL/hr over 240 Minutes Intravenous Every 8 hours 04/12/12 0059 04/13/12 0913   04/11/12 2000  vancomycin (VANCOCIN) 750 mg in sodium chloride 0.9 % 150 mL IVPB  Status:  Discontinued     750 mg 150 mL/hr over 60 Minutes Intravenous Every 24 hours 04/11/12 1908 04/13/12 0910   04/11/12 1730  clindamycin (CLEOCIN) capsule 300 mg     300 mg Oral  Once 04/11/12 1729 04/11/12 1738      Assessment: Patient with low vancomycin level. 3/22 PM dose already given after level drawn.  Goal of Therapy:  Vancomycin trough level 15-20 mcg/ml  Plan:  Measure antibiotic drug levels at steady state Follow up culture results Change to 1gm iv q24hr vancomycin, next dose at [address] [place] 04/15/2012,10:21 PM

## 2012-04-15 NOTE — Progress Notes (Signed)
Clinical Social Work Department BRIEF PSYCHOSOCIAL ASSESSMENT 04/15/2012  Patient:  [name], [name]     Account Number:  [id number]     Admit date:  04/11/2012  Clinical Social Worker:  Doroteo Glassman  Date/Time:  04/15/2012 11:39 AM  Referred by:  Physician  Date Referred:  04/15/2012 Referred for  SNF Placement   Other Referral:   Interview type:  Patient Other interview type:    PSYCHOSOCIAL DATA Living Status:  ALONE Admitted from facility:   Level of care:   Primary support name:  Annice Pih Primary support relationship to patient:  CHILD, ADULT Degree of support available:   adequate    CURRENT CONCERNS Current Concerns  Post-Acute Placement   Other Concerns:    SOCIAL WORK ASSESSMENT / PLAN Met with Pt to discuss d/c plans.    Pt stated that she will need SNF upon d/c and that she has been to [facility], by hx.  [name] open to SNF search and gave CSW permission to send her information to all [employer].    Pt stated that she is willing to pay extra for a private room.    CSW provided Pt with [place] SNF list.    Weekday CSW to follow up with bed offers.    CSW thanked Pt for her time.   Assessment/plan status:  Psychosocial Support/Ongoing Assessment of Needs Other assessment/ plan:   Information/referral to community resources:   [employer] [facility] SNF list.    PATIENT'S/FAMILY'S RESPONSE TO PLAN OF CARE: Pt thanked CSW for time and assistance.   CSW to continue to follow.  Providence Crosby, LCSWA Clinical Social Work [phone]

## 2012-04-15 NOTE — Evaluation (Signed)
Physical Therapy Evaluation Patient Details Name: [name] MRN: [id number] DOB: 1919/09/03 Today's Date: 04/15/2012 Time: 0829-0900 PT Time Calculation (min): 31 min  PT Assessment / Plan / Recommendation Clinical Impression  Very pleasant 79yo female who reports she had a stroke a couple of months ago admitted for amputation on R 4th toe.  Pt has limited weight bearing restriction on RLE and appears to have deconditioning with decreased activity tolerance.  She will benefit from continued PT at SNF prior to return to home    PT Assessment  Patient needs continued PT services    Follow Up Recommendations  SNF    Does the patient have the potential to tolerate intense rehabilitation      Barriers to Discharge Decreased caregiver support pt reports she lives alone; daughter works    Teacher, adult education for [employer] OT consult   Frequency Min 3X/week    Precautions / Restrictions Precautions Precautions: Fall Restrictions Weight Bearing Restrictions: Yes RLE Weight Bearing: Touchdown weight bearing Other Position/Activity Restrictions: post op shoe ordered, but not arrived yet. pt maintaind NWB today   Pertinent Vitals/Pain Pt reports no pain in foot, "just irritating" h/o CVA with decreased sensation on RLE     Mobility  Bed Mobility Bed Mobility: Rolling Left;Left Sidelying to Sit Rolling Left: 3: Mod assist Left Sidelying to Sit: 3: Mod assist Details for Bed Mobility Assistance: pt with some difficulty moving on hospital mattess Transfers Transfers: Sit to Stand;Stand to Sit;Stand Pivot Transfers Sit to Stand: 3: Mod assist Stand to Sit: 3: Mod assist Stand Pivot Transfers: 3: Mod assist Details for Transfer Assistance: pt needs cues to push up on armests of bedside commode Ambulation/Gait Ambulation/Gait Assistance: Not tested (comment) General Gait Details: did not advance gait as pt is TDWB and was fatigued after standing  for about a minute    Exercises Other Exercises Other Exercises: standing balance with core activation   PT Diagnosis: Generalized weakness;Difficulty walking  PT Problem List: Decreased activity tolerance;Decreased mobility;Impaired sensation;Decreased strength PT Treatment Interventions: DME instruction;Gait training;Functional mobility training;Therapeutic activities;Therapeutic exercise;Patient/family education   PT Goals Acute Rehab PT Goals PT Goal Formulation: With patient Time For Goal Achievement: 04/29/12 Potential to Achieve Goals: Good Pt will go Supine/Side to Sit: with supervision PT Goal: Supine/Side to Sit - Progress: Goal set today Pt will go Sit to Supine/Side: with supervision PT Goal: Sit to Supine/Side - Progress: Goal set today Pt will go Sit to Stand: with supervision PT Goal: Sit to Stand - Progress: Goal set today Pt will go Stand to Sit: with upper extremity assist PT Goal: Stand to Sit - Progress: Goal set today Pt will Transfer Bed to Chair/Chair to Bed: with min assist PT Transfer Goal: Bed to Chair/Chair to Bed - Progress: Goal set today Pt will Ambulate: 1 - 15 feet;with mod assist PT Goal: Ambulate - Progress: Goal set today  Visit Information  Last PT Received On: 04/15/12 Assistance Needed: +1    Subjective Data  Subjective: i'm not supposed to put weight on it (re: right foot) Patient Stated Goal: to go for some rehab   Prior Functioning  Home Living Lives With: Alone Available Help at Discharge: Family Type of Home: House Home Access: Stairs to enter Secretary/administrator of Steps: 2 Entrance Stairs-Rails: Right;Left;Can reach both Home Layout: One level Home Adaptive Equipment: Walker - rolling;Wheelchair - manual Prior Function Level of Independence: Independent with assistive device(s) Communication Communication: No difficulties  Cognition  Cognition Overall Cognitive Status: Appears within functional limits for tasks  assessed/performed Arousal/Alertness: Awake/alert Orientation Level: Appears intact for tasks assessed Behavior During Session: [facility] for tasks performed    Extremity/Trunk Assessment Right Lower Extremity Assessment RLE ROM/Strength/Tone: Deficits RLE ROM/Strength/Tone Deficits: post op bandage on right foot. pt reports she has a stroke a couple of months ago  and it affected her right side.  [name] able to move hip and knee against gravity, but there is a slowed response RLE Sensation: Deficits RLE Sensation Deficits: pt reports some decrease from her stroke Left Lower Extremity Assessment LLE ROM/Strength/Tone: WFL for tasks assessed LLE Sensation: WFL - Light Touch;WFL - Proprioception Trunk Assessment Trunk Assessment: Normal;Other exceptions Trunk Exceptions: pt with gneralized muscle atrophy   Balance Balance Balance Assessed: Yes Static Sitting Balance Static Sitting - Balance Support: No upper extremity supported;Feet supported Static Sitting - Level of Assistance: 5: Stand by assistance Static Sitting - Comment/# of Minutes: encouraged trunk extension in sitting Static Standing Balance Static Standing - Balance Support: Bilateral upper extremity supported;During functional activity Static Standing - Comment/# of Minutes: pt able to stand with RW NWB on RLE for hygiene  End of Session PT - End of Session Activity Tolerance: Patient limited by fatigue;Patient tolerated treatment well Patient left: in chair Nurse Communication: Mobility status  GP    Bayard Hugger. Manson Passey, [place] [phone] 04/15/2012, 9:10 AM

## 2012-04-16 DIAGNOSIS — R197 Diarrhea, unspecified: Secondary | ICD-10-CM

## 2012-04-16 LAB — BASIC METABOLIC PANEL
CO2: 24 mEq/L (ref 19–32)
Calcium: 8.7 mg/dL (ref 8.4–10.5)
Creatinine, Ser: 1.44 mg/dL — ABNORMAL HIGH (ref 0.50–1.10)
GFR calc non Af Amer: 31 mL/min — ABNORMAL LOW (ref >90)
Glucose, Bld: 298 mg/dL — ABNORMAL HIGH (ref 70–99)

## 2012-04-16 LAB — PROTIME-INR: INR: 1.04 (ref 0.00–1.49)

## 2012-04-16 LAB — GLUCOSE, CAPILLARY
Glucose-Capillary: 281 mg/dL — ABNORMAL HIGH (ref 70–99)
Glucose-Capillary: 309 mg/dL — ABNORMAL HIGH (ref 70–99)
Glucose-Capillary: 249 mg/dL — ABNORMAL HIGH (ref 70–99)

## 2012-04-16 MED ORDER — INSULIN GLARGINE 100 UNIT/ML ~~LOC~~ SOLN
28.0000 [IU] | Freq: Every day | SUBCUTANEOUS | Status: DC
Start: 2012-04-16 — End: 2012-04-17
  Administered 2012-04-16: 28 [IU] via SUBCUTANEOUS
  Filled 2012-04-16 (×2): qty 0.28

## 2012-04-16 NOTE — Progress Notes (Signed)
I have just given report to [name] Lecher, RN.  I have cared for her for ~2 hours, as the prior nurse went home sick.  She has eaten her lunch (independently), and has had no issues while under my care.  She remains alert and in no distress.

## 2012-04-16 NOTE — Progress Notes (Signed)
Noted plans for pt to DC to SNF. Will defer OT eval to SNF Thanks, Lise Auer, OT

## 2012-04-16 NOTE — Progress Notes (Signed)
Patient ID: [name], female   DOB: 09/04/1919, 79 y.o.   MRN: [id number] Subjective: 2 Days Post-Op Procedure(s) (LRB): AMPUTATION RAY (Right) Sleeping, awaiting SNP. Patient reports pain as mild.    Objective:   VITALS:  Temp:  [97.8 F (36.6 C)-98.1 F (36.7 C)] 98.1 F (36.7 C) (03/23 0700) Pulse Rate:  [63-73] 65 (03/23 0700) Resp:  [18] 18 (03/23 0700) BP: (110-164)/(56-74) 154/74 mmHg (03/23 0700) SpO2:  [92 %-98 %] 95 % (03/23 0700)  Neurologically intact ABD soft Incision: dressing C/D/I   LABS  Recent Labs  04/14/12 0515 04/15/12 0525  HGB 11.8* 11.3*  WBC 8.9 7.3  PLT 328 304    Recent Labs  04/15/12 0525 04/16/12 0546  NA 133* 134*  K 3.9 4.2  CL 97 101  CO2 26 24  BUN 17 17  CREATININE 1.25* 1.44*  GLUCOSE 253* 298*    Recent Labs  04/15/12 0525 04/16/12 0546  INR 1.09 1.04     Assessment/Plan: 2 Days Post-Op Procedure(s) (LRB): AMPUTATION RAY (Right)  Advance diet Up with therapy Discharge to SNF  NITKA,JAMES E 04/16/2012, 9:46 AM

## 2012-04-16 NOTE — Progress Notes (Signed)
[facility] PROGRESS NOTE  [name] WGN:[id number] DOB: 1920/01/17 DOA: 04/11/2012 PCP: [name], MD  Assessment/Plan: Cellulitis right lower extremity  -Continue vancomycin  -Discontinue Zosyn -Plan to discharge the patient with doxycycline by mouth Right fourth toe diabetic foot infection/Osteomyelitis  -MRI R-foot shows destruction of middle phalanx of 4th toe suggestive of osteomyelitis  -X-ray right foot shows soft tissue edema with soft tissue calcification  -Appreciate Dr. [name]  -s/p right fourth ray amputation 04/14/2012  -Wound cultures obtained-->MRSA  -ESR 80  Diarrhea -C. difficile PCR diabetes mellitus type 2, poorly controlled  -Elevated sugars partly due to infection  -Continue NovoLog sliding scale for now  -Increase Lantus to 2 units at bedtime -02/05/2012 hemoglobin A1c 10.3  Acute on chronic renal failure ,CKD stage III  -Baseline creatinine 1.3-1.6  -Continue Gentle hydration  Hypertension  -Continue carvedilol, Cardizem  Coronary artery disease with history of myocardial infarction  -Continue aspirin  -Continue Imdur  Hypothyroidism  -Continue Synthroid  Chronic dysarthria  -Recent MRI brain January 2014 negative for infarct  Chronic diastolic CHF  -Compensated  -EF 60-65%  Deconditioning  -Continue PT  -Plan for skilled nursing facility  Antibiotics:  Vancomycin 04/11/2012>>>  Zosyn 04/11/2012 >>>04/12/12  Family Communication: Pt at beside  Disposition Plan: SNF 04/17/12 if stable        Procedures/Studies: Mr Foot Right Wo Contrast  04/13/2012  *RADIOLOGY REPORT*  Clinical Data: Cellulitis a soft tissue ulceration of the fourth toe.  MRI OF THE RIGHT FOREFOOT WITHOUT CONTRAST  Technique:  Multiplanar, multisequence MR imaging was performed. No intravenous contrast was administered.  Comparison: Radiographs dated 04/11/2012  Findings: There is partial destruction of the distal aspect of the middle phalangeal bone of the fourth  toe with a joint effusion and fluid under the periosteum of the distal aspect of the proximal phalangeal bone.  These findings are consistent with osteomyelitis.  There is slight edema in the head and distal shaft of the fourth metatarsal but there is no joint effusion or bone destruction. This is probably due to hyperemia.  The remainder of the forefoot demonstrates no acute abnormalities.  Degenerative changes at the first metatarsal phalangeal joint and at the tarsometatarsal joints.  IMPRESSION:   Cellulitis and osteomyelitis of the fourth toe.  This is centered at the DIP joint of the toe but appears to involve the distal aspect of the proximal phalangeal bone as well.   Original Report Authenticated By: Francene Boyers, M.D.    Dg Toe 4th Right  04/11/2012  *RADIOLOGY REPORT*  Clinical Data: Skin ulcer fourth toe  RIGHT FOURTH TOE  Comparison: 07/26/2011  Findings: Soft tissue swelling and skin ulceration of the fourth toe.  Soft tissue calcification is present adjacent to the fourth middle phalanx.  This was not present previously.  This could be related to fracture or possibly osteomyelitis.  IMPRESSION: Soft tissue swelling of the fourth toe.  There is soft tissue calcification adjacent to the ulcer and middle fourth phalanx which could be due to fracture or osteomyelitis.  This was not present previously.   Original Report Authenticated By: [name], M.D.          Subjective: Patient complains of diarrhea. She denies any fevers, chills, chest pain, shortness breath, nausea, vomiting, abdominal pain, rashes. She is participating with physical therapy.  Objective: Filed Vitals:   04/15/12 1430 04/15/12 2200 04/16/12 0700 04/16/12 1335  BP: 164/65 110/56 154/74 129/70  Pulse: 73 63 65 72  Temp: 98.1 F (36.7  C) 97.8 F (36.6 C) 98.1 F (36.7 C) 98.1 F (36.7 C)  TempSrc: Oral Oral Oral Oral  Resp: 18 18 18 18   Height:      Weight:      SpO2: 92% 98% 95% 94%    Intake/Output  Summary (Last 24 hours) at 04/16/12 1704 Last data filed at 04/16/12 1600  Gross per 24 hour  Intake   1928 ml  Output      0 ml  Net   1928 ml   Weight change:  Exam:   General:  [name] alert, follows commands appropriately, not in acute distress  HEENT: No icterus, No thrush, [place]/AT  Cardiovascular: RRR, S1/S2, no rubs, no gallops  Respiratory: CTA bilaterally, no wheezing, no crackles, no rhonchi  Abdomen: Soft/+BS, non tender, non distended, no guarding  Extremities: 1+ edema, No lymphangitis, No petechiae, No rashes, no synovitis  Data Reviewed: Basic Metabolic Panel:  Recent Labs Lab 04/12/12 0506 04/13/12 0450 04/14/12 0515 04/15/12 0525 04/16/12 0546  NA 134* 132* 132* 133* 134*  K 3.3* 4.3 4.3 3.9 4.2  CL 95* 95* 97 97 101  CO2 27 25 26 26 24   GLUCOSE 105* 266* 217* 253* 298*  BUN 22 30* 28* 17 17  CREATININE 1.64* 1.96* 1.65* 1.25* 1.44*  CALCIUM 9.4 8.8 8.7 9.0 8.7  MG  --   --   --  1.5  --    Liver Function Tests: No results found for this basename: AST, ALT, ALKPHOS, BILITOT, PROT, ALBUMIN,  in the last 168 hours No results found for this basename: LIPASE, AMYLASE,  in the last 168 hours No results found for this basename: AMMONIA,  in the last 168 hours CBC:  Recent Labs Lab 04/11/12 1755 04/12/12 0506 04/13/12 0450 04/14/12 0515 04/15/12 0525  WBC 16.6* 14.2* 10.7* 8.9 7.3  NEUTROABS  --  10.8*  --   --   --   HGB 12.4 12.8 11.6* 11.8* 11.3*  HCT 38.7 39.1 35.6* 35.8* 35.9*  MCV 86.2 85.2 84.8 84.4 85.1  PLT 277 304 279 328 304   Cardiac Enzymes: No results found for this basename: CKTOTAL, CKMB, CKMBINDEX, TROPONINI,  in the last 168 hours BNP: No components found with this basename: POCBNP,  CBG:  Recent Labs Lab 04/15/12 1655 04/15/12 2120 04/16/12 0729 04/16/12 1139 04/16/12 1643  GLUCAP 267* 356* 281* 309* 249*    Recent Results (from the past 240 hour(s))  WOUND CULTURE     Status: None   Collection Time    04/11/12   8:07 PM      Result Value Range Status   Specimen Description FOOT   Final   Special Requests Normal   Final   Gram Stain     Final   Value: NO WBC SEEN     RARE SQUAMOUS EPITHELIAL CELLS PRESENT     FEW GRAM POSITIVE COCCI IN PAIRS     IN CLUSTERS   Culture     Final   Value: MODERATE METHICILLIN RESISTANT STAPHYLOCOCCUS AUREUS     Note: RIFAMPIN AND GENTAMICIN SHOULD NOT BE USED AS SINGLE DRUGS FOR TREATMENT OF STAPH INFECTIONS. CRITICAL RESULT CALLED TO, READ BACK BY AND VERIFIED WITH: ESTER E 04/14/12 AT 900 AM BY [facility]   Report Status 04/14/2012 FINAL   Final   Organism ID, Bacteria METHICILLIN RESISTANT STAPHYLOCOCCUS AUREUS   Final     Scheduled Meds: . aspirin EC  325 mg Oral Daily  . calcium carbonate  1 tablet Oral Daily  . carvedilol  6.25 mg Oral BID WC  . diltiazem  180 mg Oral QHS  . gabapentin  300 mg Oral QHS  . heparin  5,000 Units Subcutaneous Q8H  . insulin aspart  0-15 Units Subcutaneous TID WC  . insulin aspart  0-5 Units Subcutaneous QHS  . insulin glargine  28 Units Subcutaneous QHS  . isosorbide mononitrate  30 mg Oral Daily  . levothyroxine  200 mcg Oral q morning - 10a  . multivitamin with minerals  1 tablet Oral Daily  . pantoprazole  40 mg Oral Daily  . polycarbophil  1,250 mg Oral Daily  . protein supplement  1 scoop Oral TID WC  . senna  1 tablet Oral BID  . sodium chloride  3 mL Intravenous Q12H  . tamsulosin  0.4 mg Oral q morning - 10a  . vancomycin  1,000 mg Intravenous Q24H   Continuous Infusions: . sodium chloride    . sodium chloride 75 mL/hr at 04/15/12 1805  . lactated ringers 125 mL/hr at 04/14/12 2045     Anabella Capshaw, DO  [facility] Pager [phone]  If 7PM-7AM, please contact night-coverage [email or web address] Password [email or web address] 04/16/2012, 5:04 PM   LOS: 5 days

## 2012-04-17 ENCOUNTER — Encounter (HOSPITAL_COMMUNITY): Payer: Self-pay | Admitting: Orthopedic Surgery

## 2012-04-17 DIAGNOSIS — A0472 Enterocolitis due to Clostridium difficile, not specified as recurrent: Secondary | ICD-10-CM

## 2012-04-17 HISTORY — DX: Enterocolitis due to Clostridium difficile, not specified as recurrent: A04.72

## 2012-04-17 LAB — CBC
HCT: 33.8 % — ABNORMAL LOW (ref 36.0–46.0)
Hemoglobin: 10.9 g/dL — ABNORMAL LOW (ref 12.0–15.0)
MCH: 27.4 pg (ref 26.0–34.0)
MCV: 84.9 fL (ref 78.0–100.0)
RBC: 3.98 MIL/uL (ref 3.87–5.11)
WBC: 8.9 10*3/uL (ref 4.0–10.5)

## 2012-04-17 LAB — BASIC METABOLIC PANEL
BUN: 18 mg/dL (ref 6–23)
Calcium: 8.8 mg/dL (ref 8.4–10.5)
Creatinine, Ser: 1.29 mg/dL — ABNORMAL HIGH (ref 0.50–1.10)
GFR calc non Af Amer: 35 mL/min — ABNORMAL LOW (ref >90)
Glucose, Bld: 145 mg/dL — ABNORMAL HIGH (ref 70–99)
Sodium: 137 mEq/L (ref 135–145)

## 2012-04-17 LAB — CLOSTRIDIUM DIFFICILE BY PCR: Toxigenic C. Difficile by PCR: POSITIVE — AB

## 2012-04-17 MED ORDER — DOXYCYCLINE HYCLATE 100 MG PO TABS
100.0000 mg | ORAL_TABLET | Freq: Two times a day (BID) | ORAL | Status: DC
  Administered 2012-04-17: 100 mg via ORAL
  Filled 2012-04-17 (×3): qty 1

## 2012-04-17 MED ORDER — HYDRALAZINE HCL 20 MG/ML IJ SOLN
INTRAMUSCULAR | Status: AC
  Filled 2012-04-17: qty 1

## 2012-04-17 MED ORDER — OXYCODONE HCL 5 MG PO TABS: 5.0000 mg | ORAL_TABLET | ORAL | Status: DC | PRN

## 2012-04-17 MED ORDER — METRONIDAZOLE 500 MG PO TABS
500.0000 mg | ORAL_TABLET | Freq: Three times a day (TID) | ORAL | Status: DC
  Administered 2012-04-17: 500 mg via ORAL
  Filled 2012-04-17 (×3): qty 1

## 2012-04-17 MED ORDER — DOXYCYCLINE HYCLATE 100 MG PO TABS: 100.0000 mg | ORAL_TABLET | Freq: Two times a day (BID) | ORAL | Status: DC

## 2012-04-17 MED ORDER — HYDRALAZINE HCL 20 MG/ML IJ SOLN: 10.0000 mg | Freq: Once | INTRAMUSCULAR | Status: DC

## 2012-04-17 MED ORDER — METRONIDAZOLE 500 MG PO TABS: 500.0000 mg | ORAL_TABLET | Freq: Three times a day (TID) | ORAL | Status: DC

## 2012-04-17 MED ORDER — MAGNESIUM SULFATE 40 MG/ML IJ SOLN
2.0000 g | Freq: Once | INTRAMUSCULAR | Status: AC
  Administered 2012-04-17: 2 g via INTRAVENOUS
  Filled 2012-04-17: qty 50

## 2012-04-17 NOTE — Progress Notes (Signed)
Clinical Social Work  CSW met with patient at bedside who chose [facility]. SNF agreeable to admission today. CSW informed dtr who is agreeable to plan and will complete paperwork this afternoon. DC summary faxed to SNF and dc packet prepared with FL2 and hard scripts. CSW informed patient, RN and dtr of dc and coordinated transportation via [facility] for 1400 pick up. CSW is signing off but available if needed.  [place], [place] [phone]

## 2012-04-17 NOTE — Progress Notes (Signed)
Discharge instructions accompanied pt, left the unit in stable condition, transported via ambulance to SNF. 

## 2012-04-17 NOTE — Discharge Summary (Signed)
Physician Discharge Summary  [name]:[id number] DOB: 07/04/1919 DOA: 04/11/2012  PCP: [name] Grippe, MD  Admit date: 04/11/2012 Discharge date: 04/17/2012  Recommendations for Outpatient Follow-up:  1. Pt will need to follow up with PCP in 2 weeks post discharge 2. Please obtain BMP to evaluate electrolytes and kidney function 3. Please also check CBC to evaluate Hg and Hct levels 4. Follow up with Dr. Lajoyce [name] in 2 weeks 5. Flagyl 500 mg 3 times a day x10 days; doxycycline 100 mg twice a day x5 days  Discharge Diagnoses:  Principal Problem:   Osteomyelitis Active Problems:   DIABETES MELLITUS, TYPE II, UNCONTROLLED   DIABETIC PERIPHERAL NEUROPATHY   HYPERTENSION, BENIGN ESSENTIAL   Hypothyroidism   Renal failure (ARF), acute on chronic   Type 2 diabetes mellitus with diabetic foot infection   CKD (chronic kidney disease) stage 3, GFR 30-59 ml/min   Acute osteomyelitis of toe   Acute on chronic renal failure   Diarrhea Cellulitis right lower extremity  -The patient had erythema streaking up from her dorsal foot to the distal third of her pretibial area -The patient was initially started on empiric vancomycin and Zosyn -Wound cultures were obtained which subsequently grew MRSA from her right fourth toe -Continued vancomycin until the time of discharge -Discontinued Zosyn  -The patient's erythema receded and improved with intravenous antibiotics -Patient remained hemodynamically stable -Patient was switched to doxycycline 100 mg twice a day for 5 additional days Right fourth toe diabetic foot infection/Osteomyelitis  -X-ray right foot shows soft tissue edema with soft tissue calcification -MRI R-foot shows destruction of middle phalanx of 4th toe suggestive of osteomyelitis  -Dr. Lajoyce [name] consulted for surgical options -Appreciate Dr. Lajoyce [name]  -s/p right fourth ray amputation 04/14/2012  -Wound cultures obtained-->MRSA--> placed on contact isolation -Initial WBC 16.6 which improved  with antibiotics and subsequent amputation to 7.3 -ESR 80  -Patient is instructed to follow up with Dr. Lajoyce [name] in 2 weeks in the outpatient setting Clostridium difficile colitis -The patient developed diarrhea on the day prior to discharge -C. difficile PCR was positive -Flagyl 500 mg by mouth 3 times a day was started for a ten-day course on the day of discharge -WBC 8.9 on the day of discharge -Serum creatinine actually improved to 1.29 on the day of discharge diabetes mellitus type 2, poorly controlled  -Elevated sugars partly due to infection  -Continue NovoLog sliding scale for now  -Start Lantus 15 units at bedtime  -Hemoglobin A1c 10.3 on 02/05/2012 -The patient will be restarted on her home dose of Lantus,  28 units at bedtime--this will be continued at the time of discharge -4 times a day followup with her primary care provider for further adjustment of her diabetic regimen Acute on chronic renal failure ,CKD stage III  -The patient's serum creatinine peaked at 1.96 -Baseline creatinine 1.3-1.6  -02/05/2012 hemoglobin A1c 10.3  -Continue Gentle hydration--> serum creatinine improved to 1.29 on the day of discharge Hypertension  -Continue carvedilol, Cardizem  Coronary artery disease with history of myocardial infarction  -Continue aspirin  -Continue Imdur  Hypothyroidism  -Continue Synthroid  Chronic dysarthria  -Recent MRI brain January 2014 negative for infarct  Chronic diastolic CHF  -Compensated  -EF 60-65%  Deconditioning  -Continue PT  -Plan for skilled nursing facility, social work assisted with placement Antibiotics:  Vancomycin 04/11/2012>>> 04/17/2012 Zosyn 04/11/2012 >>>04/12/12 Doxycycline 04/17/2012>>> 5 additional days   Discharge Condition: stable  Disposition:  Follow-up Information   Follow up with [name] Mustard, MD  In 2 weeks.   Contact information:   [address] [place] [address] [phone]       Diet: carbohydrate  modified Wt Readings from Last 3 Encounters:  04/12/12 73.483 kg (162 lb)  04/12/12 73.483 kg (162 lb)  02/17/12 78.6 kg (173 lb 4.5 oz)    History of present illness:  79 year old female with past medical history most significant for uncontrolled diabetes was a patient of Dr. Selena [name] and has been having difficulty controlling her blood sugars over last few months. Patient has had this skin breakdown on her fourth toe of the right leg since last one month. The patient was recently discharged from inpatient [name] on 02/18/2012. At that time, the patient was admitted due to multifactorial chronic right-sided weakness and gait disorder. The patient states that she had a small ulceration on her right fourth toe at that time which was being managed by home health wound care. Unfortunately, in the last 2 days prior to admission, the patient's toe became increasingly red, swollen and draining blood and pus. There is focal skin change, with increasing size, and new drainage.The patient has a long history of diabetes, has distal extremity neuropathy. She denies any pain in her right foot. She denied any fevers, chills, chest pain, shortness of breath, vomiting, abdominal pain.    Consultants: Ortho, Dr. Lajoyce [name]  Discharge Exam: Filed Vitals:   04/17/12 0600  BP: 179/52  Pulse: 69  Temp: 97.6 F (36.4 C)  Resp: 18   Filed Vitals:   04/16/12 1335 04/16/12 2200 04/16/12 2300 04/17/12 0600  BP: 129/70 170/66 156/70 179/52  Pulse: 72 67  69  Temp: 98.1 F (36.7 C) 97.9 F (36.6 C)  97.6 F (36.4 C)  TempSrc: Oral Oral  Oral  Resp: 18 18  18   Height:      Weight:      SpO2: 94% 91%  93%   General: A&O x 2, NAD, pleasant, cooperative Cardiovascular: RRR, no rub, no gallop, no S3 Respiratory: CTAB, no wheeze, no rhonchi Abdomen:soft, nontender, nondistended, positive bowel sounds Extremities: Patient's right foot was bandaged without any streaking lymphangitis or crepitance  or necrosis. There was trace edema in the left lower extremity   Discharge Instructions      Discharge Orders   Future Orders Complete By Expires     Diet - low sodium heart healthy  As directed     Increase activity slowly  As directed         Medication List    TAKE these medications       aspirin EC 325 MG tablet  Take 1 tablet (325 mg total) by mouth daily.     calcium carbonate 1250 MG tablet  Commonly known as:  OS-CAL - dosed in mg of elemental calcium  Take 1 tablet by mouth daily.     carvedilol 6.25 MG tablet  Commonly known as:  COREG  Take 6.25 mg by mouth 2 (two) times daily with a meal.     diltiazem 180 MG 24 hr capsule  Commonly known as:  CARDIZEM CD  Take 180 mg by mouth at bedtime.     doxycycline 100 MG tablet  Commonly known as:  VIBRA-TABS  Take 1 tablet (100 mg total) by mouth every 12 (twelve) hours.     gabapentin 300 MG capsule  Commonly known as:  NEURONTIN  Take 1 capsule (300 mg total) by mouth at bedtime.     insulin aspart  100 UNIT/ML injection  Commonly known as:  novoLOG  Inject 4-6 Units into the skin 3 (three) times daily with meals. Use 6 units with breakfast and supper.   Use 4 units with lunch.     insulin glargine 100 UNIT/ML injection  Commonly known as:  LANTUS  Inject 28 Units into the skin at bedtime.     isosorbide mononitrate 30 MG 24 hr tablet  Commonly known as:  IMDUR  Take 30 mg by mouth daily.     levothyroxine 200 MCG tablet  Commonly known as:  SYNTHROID, LEVOTHROID  Take 200 mcg by mouth every morning.     metroNIDAZOLE 500 MG tablet  Commonly known as:  FLAGYL  Take 1 tablet (500 mg total) by mouth every 8 (eight) hours.     multivitamin with minerals Tabs  Take 1 tablet by mouth daily.     neomycin-bacitracin-polymyxin ointment  Commonly known as:  NEOSPORIN  Apply topically daily. Apply to right foot callous on plantar surface and to upper toe callous Q day.  Leave open to air or cover with band  aid if desired.     oxyCODONE 5 MG immediate release tablet  Commonly known as:  Oxy IR/ROXICODONE  Take 1 tablet (5 mg total) by mouth every 4 (four) hours as needed.     pantoprazole 40 MG tablet  Commonly known as:  PROTONIX  Take 1 tablet (40 mg total) by mouth daily.     polycarbophil 625 MG tablet  Commonly known as:  FIBERCON  Take 2 tablets (1,250 mg total) by mouth daily. To help with formed stools.     protein supplement Powd  Take 6 g by mouth 3 (three) times daily with meals.     tamsulosin 0.4 MG Caps  Commonly known as:  FLOMAX  Take 0.4 mg by mouth every morning.         The results of significant diagnostics from this hospitalization (including imaging, microbiology, ancillary and laboratory) are listed below for reference.    Significant Diagnostic Studies: Mr Foot Right Wo Contrast  04/13/2012  *RADIOLOGY REPORT*  Clinical Data: Cellulitis a soft tissue ulceration of the fourth toe.  MRI OF THE RIGHT FOREFOOT WITHOUT CONTRAST  Technique:  Multiplanar, multisequence MR imaging was performed. No intravenous contrast was administered.  Comparison: Radiographs dated 04/11/2012  Findings: There is partial destruction of the distal aspect of the middle phalangeal bone of the fourth toe with a joint effusion and fluid under the periosteum of the distal aspect of the proximal phalangeal bone.  These findings are consistent with osteomyelitis.  There is slight edema in the head and distal shaft of the fourth metatarsal but there is no joint effusion or bone destruction. This is probably due to hyperemia.  The remainder of the forefoot demonstrates no acute abnormalities.  Degenerative changes at the first metatarsal phalangeal joint and at the tarsometatarsal joints.  IMPRESSION:   Cellulitis and osteomyelitis of the fourth toe.  This is centered at the DIP joint of the toe but appears to involve the distal aspect of the proximal phalangeal bone as well.   Original Report  Authenticated By: Francene Boyers, M.D.    Dg Toe 4th Right  04/11/2012  *RADIOLOGY REPORT*  Clinical Data: Skin ulcer fourth toe  RIGHT FOURTH TOE  Comparison: 07/26/2011  Findings: Soft tissue swelling and skin ulceration of the fourth toe.  Soft tissue calcification is present adjacent to the fourth middle phalanx.  This was not present  previously.  This could be related to fracture or possibly osteomyelitis.  IMPRESSION: Soft tissue swelling of the fourth toe.  There is soft tissue calcification adjacent to the ulcer and middle fourth phalanx which could be due to fracture or osteomyelitis.  This was not present previously.   Original Report Authenticated By: [name], M.D.      Microbiology: Recent Results (from the past 240 hour(s))  WOUND CULTURE     Status: None   Collection Time    04/11/12  8:07 PM      Result Value Range Status   Specimen Description FOOT   Final   Special Requests Normal   Final   Gram Stain     Final   Value: NO WBC SEEN     RARE SQUAMOUS EPITHELIAL CELLS PRESENT     FEW GRAM POSITIVE COCCI IN PAIRS     IN CLUSTERS   Culture     Final   Value: MODERATE METHICILLIN RESISTANT STAPHYLOCOCCUS AUREUS     Note: RIFAMPIN AND GENTAMICIN SHOULD NOT BE USED AS SINGLE DRUGS FOR TREATMENT OF STAPH INFECTIONS. CRITICAL RESULT CALLED TO, READ BACK BY AND VERIFIED WITH: ESTER E 04/14/12 AT 900 AM BY [facility]   Report Status 04/14/2012 FINAL   Final   Organism ID, Bacteria METHICILLIN RESISTANT STAPHYLOCOCCUS AUREUS   Final  CLOSTRIDIUM DIFFICILE BY PCR     Status: Abnormal   Collection Time    04/16/12  9:17 PM      Result Value Range Status   C difficile by pcr POSITIVE (*) NEGATIVE Final   Comment: CRITICAL RESULT CALLED TO, READ BACK BY AND VERIFIED WITH:     E. EDGAR,RN 04/17/12 0854 BY K SCHULTZ     Labs: Basic Metabolic Panel:  Recent Labs Lab 04/13/12 0450 04/14/12 0515 04/15/12 0525 04/16/12 0546 04/17/12 0505  NA 132* 132* 133* 134* 137  K 4.3 4.3 3.9  4.2 4.0  CL 95* 97 97 101 104  CO2 25 26 26 24 24   GLUCOSE 266* 217* 253* 298* 145*  BUN 30* 28* 17 17 18   CREATININE 1.96* 1.65* 1.25* 1.44* 1.29*  CALCIUM 8.8 8.7 9.0 8.7 8.8  MG  --   --  1.5  --  1.4*   Liver Function Tests: No results found for this basename: AST, ALT, ALKPHOS, BILITOT, PROT, ALBUMIN,  in the last 168 hours No results found for this basename: LIPASE, AMYLASE,  in the last 168 hours No results found for this basename: AMMONIA,  in the last 168 hours CBC:  Recent Labs Lab 04/12/12 0506 04/13/12 0450 04/14/12 0515 04/15/12 0525 04/17/12 0505  WBC 14.2* 10.7* 8.9 7.3 8.9  NEUTROABS 10.8*  --   --   --   --   HGB 12.8 11.6* 11.8* 11.3* 10.9*  HCT 39.1 35.6* 35.8* 35.9* 33.8*  MCV 85.2 84.8 84.4 85.1 84.9  PLT 304 279 328 304 374   Cardiac Enzymes: No results found for this basename: CKTOTAL, CKMB, CKMBINDEX, TROPONINI,  in the last 168 hours BNP: No components found with this basename: POCBNP,  CBG:  Recent Labs Lab 04/16/12 0729 04/16/12 1139 04/16/12 1643 04/16/12 2219 04/17/12 0333  GLUCAP 281* 309* 249* 244* 163*    Time coordinating discharge:  Greater than 30 minutes  Signed:  Ramiel Forti, DO [facility] Pager: [phone] 04/17/2012, 11:21 AM

## 2012-04-18 ENCOUNTER — Non-Acute Institutional Stay (SKILLED_NURSING_FACILITY): Payer: Medicare Other | Admitting: Internal Medicine

## 2012-04-18 DIAGNOSIS — I150 Renovascular hypertension: Secondary | ICD-10-CM

## 2012-04-18 DIAGNOSIS — M9080 Osteopathy in diseases classified elsewhere, unspecified site: Secondary | ICD-10-CM

## 2012-04-18 DIAGNOSIS — E1169 Type 2 diabetes mellitus with other specified complication: Secondary | ICD-10-CM

## 2012-04-18 DIAGNOSIS — M869 Osteomyelitis, unspecified: Secondary | ICD-10-CM

## 2012-04-18 DIAGNOSIS — E1165 Type 2 diabetes mellitus with hyperglycemia: Secondary | ICD-10-CM

## 2012-04-18 DIAGNOSIS — A0472 Enterocolitis due to Clostridium difficile, not specified as recurrent: Secondary | ICD-10-CM

## 2012-04-18 LAB — GLUCOSE, CAPILLARY: Glucose-Capillary: 114 mg/dL — ABNORMAL HIGH (ref 70–99)

## 2012-04-19 ENCOUNTER — Encounter: Payer: Self-pay | Admitting: Internal Medicine

## 2012-04-19 ENCOUNTER — Non-Acute Institutional Stay (SKILLED_NURSING_FACILITY): Payer: Medicare Other | Admitting: Adult Health

## 2012-04-19 DIAGNOSIS — N183 Chronic kidney disease, stage 3 unspecified: Secondary | ICD-10-CM

## 2012-04-19 DIAGNOSIS — E875 Hyperkalemia: Secondary | ICD-10-CM

## 2012-04-19 DIAGNOSIS — IMO0001 Reserved for inherently not codable concepts without codable children: Secondary | ICD-10-CM

## 2012-04-19 NOTE — Progress Notes (Deleted)
  Subjective:    Patient ID: ALDEA AVIS, female    DOB: [birth year], 79 y.o.   MRN: [id number]  HPI This is a 79 year-old Caucasian female who has CBGs 300-400s. She has uncontrolled diabetes and recently had S/P right 4th ray amputation (04/14/12). Currently the patient is on antibiotic for osteomyelitis.   Review of Systems  Constitutional: Negative.   HENT: Negative.   Eyes: Negative.   Respiratory: Negative.   Cardiovascular: Negative for leg swelling.  Gastrointestinal: Negative.   Endocrine: Negative.   Genitourinary: Negative.   Neurological: Negative.   Hematological: Does not bruise/bleed easily.  Psychiatric/Behavioral: Negative.        Objective:   Physical Exam  Constitutional: She appears well-developed and well-nourished.  HENT:  Head: Normocephalic.  Right Ear: External ear normal.  Left Ear: External ear normal.  Eyes: Conjunctivae are normal. Pupils are equal, round, and reactive to light.  Neck: Normal range of motion. Neck supple.  Cardiovascular: Normal rate and regular rhythm.           Assessment & Plan:

## 2012-04-29 NOTE — Progress Notes (Signed)
Patient ID: [name], female   DOB: Feb 24, 1919, 79 y.o.   MRN: [id number]        HISTORY & PHYSICAL  DATE:   04/18/2012  FACILITY:  [facility]   LEVEL OF CARE: SNF   ALLERGIES:  Allergies  Allergen Reactions  . Ephedrine   . Morphine Other (See Comments)    Difficulty waking up & talking after surgery  . Rocephin (Ceftriaxone Sodium In Dextrose)     Developed acute resp distress, wheezing after 2nd dose.     CHIEF COMPLAINT:  Manage right foot osteomyelitis, diabetes mellitus and C. difficile colitis.  HISTORY OF PRESENT ILLNESS:  79 year-old, Caucasian female was hospitalized secondary to worsening ulcer in her right lower extremity with cellulitis and purulent drainage.   Wound cultures grew MRSA.  She was initially treated with vancomycin and zosyn.  Later, zosyn was discontinued.  X-ray of the right foot was suggestive of osteomyelitis and MRI confirmed.  Therefore, she underwent right foot ray amputation by Orthopedic Surgery and tolerated the procedure well.  She denies right foot pain now.  She was admitted to this facility for short-term rehabilitation.   DM:  Patient was started on Lantus due to elevated blood sugars.  pt's DM remains stable.  Pt denies polyuria, polydipsia, polyphagia, changes in vision or hypoglycemic episodes.  No complications noted from the medication presently being used.  Last hemoglobin A1c is:  10.3 in January.    C. difficile colitis.  Patient developed diarrhea prior to discharge and her C. diff PCR was positive.  Therefore, she was started on Flagyl and is tolerating it without any  side effects.  White count was 8.9 at discharge.  She denies diarrhea, abdominal pain, nausea or vomiting.    PAST MEDICAL HISTORY :  Past Medical History  Diagnosis Date  . Myocardial infarction   . Coronary artery disease   . Hypertension   . Dysrhythmia   . Peripheral vascular disease   . Hypothyroidism   . Diabetes mellitus   . Presbyesophagus  04/23/2011  . Urinary retention with incomplete bladder emptying 04/23/2011  . UTI (lower urinary tract infection) 04/21/2011  . Diastolic CHF, chronic     Grade I diastolic dysfunction on Echo 04/26/11  . Anaphylaxis 04/25/2011    Thought to be from Rocephin     PAST SURGICAL HISTORY: Past Surgical History  Procedure Laterality Date  . Cholecystectomy    . Colonoscopy  01/15/2011    Procedure: COLONOSCOPY;  Surgeon: Theda Belfast, MD;  Location: [facility];  Service: Endoscopy;  Laterality: N/A;  . Amputation Right 04/14/2012    Procedure: AMPUTATION RAY;  Surgeon: [name], MD;  Location: [facility];  Service: Orthopedics;  Laterality: Right;  ankle block     SOCIAL HISTORY:  reports that she quit smoking about 31 years ago. Her smoking use included Cigarettes. She smoked 0.00 packs per day. She has never used smokeless tobacco. She reports that  drinks alcohol. She reports that she does not use illicit drugs.   FAMILY HISTORY:  Family History  Problem Relation Age of Onset  . Cancer Sister   . Diabetes Mother   . Heart disease Father      CURRENT MEDICATIONS: Reviewed per [facility]  REVIEW OF SYSTEMS:  See HPI otherwise 14 point ROS is negative.   PHYSICAL EXAMINATION  VS:  T  96.8      P 71      RR 18  BP 172/69      POX%        WT (Lb)  GENERAL: no acute distress, normal body habitus SKIN: warm & dry, no suspicious lesions or rashes, no excessive dryness EYES: conjunctivae normal, sclerae normal, normal eye lids MOUTH/THROAT: lips without lesions,no lesions in the mouth,tongue is without lesions,uvula elevates in midline NECK: supple, trachea midline, no neck masses, no thyroid tenderness, no thyromegaly LYMPHATICS: no LAN in the neck, no supraclavicular LAN RESPIRATORY: breathing is even & unlabored, BS CTAB CARDIAC: RRR, no murmur,no extra heart sounds EDEMA/VARICOSITIES:  +1 bilateral edema in the left foot.   ARTERIAL:  Left pedal pulse nonpalpable.   Right  foot is dressed. GI:  ABDOMEN: abdomen soft, normal BS, no masses, no tenderness  LIVER/SPLEEN: no hepatomegaly, no splenomegaly MUSCULOSKELETAL: HEAD: normal to inspection & palpation BACK: no kyphosis, scoliosis or spinal processes tenderness EXTREMITIES: LEFT UPPER EXTREMITY: full range of motion, normal strength & tone RIGHT UPPER EXTREMITY:  Strength intact, range of motion moderate LEFT LOWER EXTREMITY:  Strength intact, range of motion minimal RIGHT LOWER EXTREMITY:  Not tested due to her surgery PSYCHIATRIC: the patient is alert & oriented to person, affect & behavior appropriate  LABS/RADIOLOGY:  Right foot MRI showed cellulitis and osteomyelitis of the fourth toe.    Right fourth toe x-ray showed soft tissue calcification adjacent to the ulcer and middle phalanx which could be due to osteomyelitis.    Creatinine 1.29, glucose 145, otherwise BMP normal.    Magnesium 1.4.    Hemoglobin 10.9, MCV 84.9, otherwise CBC normal.    ASSESSMENT/PLAN:  Right foot osteomyelitis.  Status post right foot ray amputation.  Continue wound care and rehabilitation.  Also on doxycycline.   Diabetes mellitus with renal complications.  Uncontrolled.  Lantus was started.  Monitor CBGs.   C. difficile colitis.  Continue flagyl.    Renovascular hypertension.  Blood pressure is elevated.  We will monitor.   Chronic kidney disease stage III.  Reassess renal functions.    Coronary artery disease.  Stable.   Hypothyroidism.  Continue synthroid.    CHF.  Well compensated.    V58.69.  Check CBC and BMP.  I have reviewed patient's medical records received from hospitalization.  CPT CODE: 40981.

## 2012-05-01 ENCOUNTER — Non-Acute Institutional Stay (SKILLED_NURSING_FACILITY): Payer: Medicare Other | Admitting: Adult Health

## 2012-05-01 DIAGNOSIS — G4700 Insomnia, unspecified: Secondary | ICD-10-CM

## 2012-05-23 ENCOUNTER — Non-Acute Institutional Stay (SKILLED_NURSING_FACILITY): Payer: Medicare Other | Admitting: Internal Medicine

## 2012-05-23 DIAGNOSIS — G609 Hereditary and idiopathic neuropathy, unspecified: Secondary | ICD-10-CM

## 2012-05-23 DIAGNOSIS — E1049 Type 1 diabetes mellitus with other diabetic neurological complication: Secondary | ICD-10-CM

## 2012-05-23 DIAGNOSIS — E039 Hypothyroidism, unspecified: Secondary | ICD-10-CM

## 2012-05-23 DIAGNOSIS — I2510 Atherosclerotic heart disease of native coronary artery without angina pectoris: Secondary | ICD-10-CM

## 2012-05-29 ENCOUNTER — Non-Acute Institutional Stay (SKILLED_NURSING_FACILITY): Payer: Medicare Other | Admitting: Adult Health

## 2012-05-29 DIAGNOSIS — K219 Gastro-esophageal reflux disease without esophagitis: Secondary | ICD-10-CM

## 2012-05-29 DIAGNOSIS — G4700 Insomnia, unspecified: Secondary | ICD-10-CM

## 2012-05-29 DIAGNOSIS — E039 Hypothyroidism, unspecified: Secondary | ICD-10-CM

## 2012-05-29 DIAGNOSIS — I10 Essential (primary) hypertension: Secondary | ICD-10-CM

## 2012-05-29 DIAGNOSIS — E1149 Type 2 diabetes mellitus with other diabetic neurological complication: Secondary | ICD-10-CM

## 2012-05-31 ENCOUNTER — Encounter: Payer: Self-pay | Admitting: Adult Health

## 2012-05-31 DIAGNOSIS — K219 Gastro-esophageal reflux disease without esophagitis: Secondary | ICD-10-CM | POA: Insufficient documentation

## 2012-05-31 DIAGNOSIS — G4700 Insomnia, unspecified: Secondary | ICD-10-CM | POA: Insufficient documentation

## 2012-05-31 DIAGNOSIS — E875 Hyperkalemia: Secondary | ICD-10-CM | POA: Insufficient documentation

## 2012-05-31 NOTE — Progress Notes (Signed)
  Subjective:    Patient ID: [name], female    DOB: Feb 14, 1919, 79 y.o.   MRN: [id number]  HPI This is a 79 year old female who was noted to have CBGs  Between 300s and 400s. Patient has  recently been admitted to [facility] with acute osteomyelitis of toe S/P right fourth ray amputation. K = 5.6 - elevated. No complaints of chest pain nor shortness of breath.  Review of Systems  Constitutional: Negative.   HENT: Negative.   Eyes: Negative.   Respiratory: Negative for cough and chest tightness.   Cardiovascular: Positive for leg swelling.  Gastrointestinal: Negative.   Endocrine: Negative.   Genitourinary: Negative.   Neurological: Negative.   Hematological: Negative for adenopathy. Does not bruise/bleed easily.  Psychiatric/Behavioral: Negative.        Objective:   Physical Exam  Constitutional: She is oriented to person, place, and time. She appears well-developed and well-nourished.  HENT:  Head: Normocephalic and atraumatic.  Right Ear: External ear normal.  Left Ear: External ear normal.  Eyes: Conjunctivae are normal. Pupils are equal, round, and reactive to light.  Neck: Normal range of motion. Neck supple.  Cardiovascular: Normal rate, regular rhythm and normal heart sounds.   Pulmonary/Chest: Effort normal and breath sounds normal.  Abdominal: Soft. Bowel sounds are normal.  Musculoskeletal: Normal range of motion. She exhibits edema. She exhibits no tenderness.  BLE edema, 1+  Neurological: She is alert and oriented to person, place, and time.  Skin: Skin is warm and dry.  Psychiatric: She has a normal mood and affect.   LABS: 04/19/12  NA 137  K 5.6  BUN 28  Creatinine 1.73  Glucose 326       Assessment & Plan:  Hyperkalemia - start Kayexalate 15 gm/60 ml PO x 1   Chronic Kidney Disease, stage III - BMP in AM  Diabetes Mellitus with renal complications, uncontrolled - increase Lantus 32 units SQ Q HS and Lantus 5 units SQ Q AM

## 2012-05-31 NOTE — Progress Notes (Signed)
  Subjective:    Patient ID: [name], female    DOB: 02/03/1919, 79 y.o.   MRN: [id number]  HPI This is a 79 year old female who complains of difficulty sleeping at night.   Review of Systems  Constitutional: Negative.   HENT: Negative.   Eyes: Negative.   Respiratory: Negative.   Cardiovascular: Positive for leg swelling.  Neurological: Negative.   Psychiatric/Behavioral: Positive for sleep disturbance.       Objective:   Physical Exam  Nursing note and vitals reviewed. Constitutional: She is oriented to person, place, and time. She appears well-developed and well-nourished.  HENT:  Head: Normocephalic and atraumatic.  Right Ear: External ear normal.  Left Ear: External ear normal.  Eyes: Conjunctivae are normal. Pupils are equal, round, and reactive to light.  Neck: Normal range of motion. Neck supple. No thyromegaly present.  Cardiovascular: Normal rate, regular rhythm and normal heart sounds.   Pulmonary/Chest: Effort normal and breath sounds normal.  Abdominal: Soft. Bowel sounds are normal.  Musculoskeletal: She exhibits edema. She exhibits no tenderness.  BLE edema, 1+  Neurological: She is alert and oriented to person, place, and time.  Skin: Skin is warm and dry.  Psychiatric: She has a normal mood and affect. Her behavior is normal. Judgment and thought content normal.   LABS: 4/07/14Bmp nl except creatinine 1.40 04/18/12  Cbc nl  Bmp nl except creatinine 1.51  hgbA1c 10.4        Assessment & Plan:   Insomnia -  Ambien 5 mg PO Q HS PRN

## 2012-05-31 NOTE — Progress Notes (Signed)
  Subjective:    Patient ID: [name], female    DOB: 1919/10/05, 79 y.o.   MRN: [id number]  HPI  This is a 79 year old female who is for discharge home with Home health PT, OT, Nursing and CNA. She has been admitted to [facility] on 04/17/12 from [facility] with acute osteomyelitis of toe S/P right fourth ray amputation. She has been admitted for a short-term rehabilitation.   Review of Systems  Constitutional: Negative.   HENT: Negative.   Eyes: Negative.   Respiratory: Negative for cough and chest tightness.   Cardiovascular: Positive for leg swelling.  Gastrointestinal: Negative.   Endocrine: Negative.   Genitourinary: Negative.   Neurological: Negative.   Hematological: Negative for adenopathy. Does not bruise/bleed easily.  Psychiatric/Behavioral: Negative.        Objective:   Physical Exam  Constitutional: She is oriented to person, place, and time. She appears well-developed and well-nourished.  HENT:  Head: Normocephalic and atraumatic.  Right Ear: External ear normal.  Left Ear: External ear normal.  Eyes: Conjunctivae are normal. Pupils are equal, round, and reactive to light.  Neck: Normal range of motion. Neck supple.  Cardiovascular: Normal rate, regular rhythm and normal heart sounds.   Pulmonary/Chest: Effort normal and breath sounds normal.  Abdominal: Soft. Bowel sounds are normal.  Musculoskeletal: Normal range of motion. She exhibits edema. She exhibits no tenderness.  BLE edema, 1+  Neurological: She is alert and oriented to person, place, and time.  Skin: Skin is warm and dry.  Psychiatric: She has a normal mood and affect.   LABS: 05/17/12  Bmp nl except BUN 34 creatinine 1.51 04/19/12  NA 137  K 5.6  BUN 28  Creatinine 1.73  Glucose 326 04/18/12  Cbc nl bmp nl except creatinine 1.51  hgbA1c 10.4  Medications reviewed per [facility]    Assessment & Plan:  Insomnia  -  No complaints  GERD (gastroesophageal reflux disease)  - stable  CKD  (chronic kidney disease) stage 3, GFR 30-59 ml/min  stable  Hypothyroidism  -  stable  DIABETIC PERIPHERAL NEUROPATHY  - no complaints  DIABETES MELLITUS, TYPE II, UNCONTROLLED  -  stable  HYPERTENSION, BENIGN ESSENTIAL -  Well-controlled

## 2012-06-07 DIAGNOSIS — G609 Hereditary and idiopathic neuropathy, unspecified: Secondary | ICD-10-CM | POA: Insufficient documentation

## 2012-06-07 DIAGNOSIS — E1049 Type 1 diabetes mellitus with other diabetic neurological complication: Secondary | ICD-10-CM | POA: Insufficient documentation

## 2012-06-07 DIAGNOSIS — I2510 Atherosclerotic heart disease of native coronary artery without angina pectoris: Secondary | ICD-10-CM | POA: Insufficient documentation

## 2012-06-07 DIAGNOSIS — E039 Hypothyroidism, unspecified: Secondary | ICD-10-CM | POA: Insufficient documentation

## 2012-06-07 NOTE — Progress Notes (Signed)
Patient ID: [name], female   DOB: 1919/12/28, 79 y.o.   MRN: [id number]        PROGRESS NOTE  DATE: 05/23/2012  FACILITY: Nursing Home Location: [facility]  LEVEL OF CARE: SNF (31)  Routine Visit  CHIEF COMPLAINT:  Manage diabetes mellitus, hypothyroidism, peripheral neuropathy.    HISTORY OF PRESENT ILLNESS:  REASSESSMENT OF ONGOING PROBLEM(S):  PERIPHERAL NEUROPATHY: The peripheral neuropathy is stable. The patient denies pain in the feet, tingling, and numbness. No complications noted from the medication presently being used.    DM:pt's DM remains stable.  Pt denies polyuria, polydipsia, polyphagia, changes in vision or hypoglycemic episodes.  No complications noted from the medication presently being used.  Last hemoglobin A1c is: 10.4 in 03/2012.    HYPOTHYROIDISM: The hypothyroidism remains stable. No complications noted from the medications presently being used.  The patient denies fatigue or constipation.  Last TSH 2.946 in 03/2012.    PAST MEDICAL HISTORY : Reviewed.  No changes.  CURRENT MEDICATIONS: Reviewed per [facility]  REVIEW OF SYSTEMS:  GENERAL: no change in appetite, no fatigue, no weight changes, no fever, chills or weakness RESPIRATORY: no cough, SOB, DOE, wheezing, hemoptysis CARDIAC: no chest pain or palpitations, complains of bilateral lower extremity swelling  GI: no abdominal pain, diarrhea, constipation, heart burn, nausea or vomiting  PHYSICAL EXAMINATION  VS:  T 97.1      P 91      RR 20     BP 137/67    POX %     WT (Lb)  GENERAL: no acute distress, normal body habitus EYES: conjunctivae normal, sclerae normal, normal eye lids NECK: supple, trachea midline, no neck masses, no thyroid tenderness, no thyromegaly LYMPHATICS: no LAN in the neck, no supraclavicular LAN RESPIRATORY: breathing is even & unlabored, BS CTAB CARDIAC: RRR, no murmur,no extra heart sounds  EDEMA/VARICOSITIES:  +2 bilateral lower extremity edema   ARTERIAL:  pedal pulses nonpalpable  GI: abdomen soft, normal BS, no masses, no tenderness, no hepatomegaly, no splenomegaly PSYCHIATRIC: the patient is alert & oriented to person, affect & behavior appropriate  LABS/RADIOLOGY: 04/2012:  BUN 32, creatinine 1.52, otherwise BMP normal.    03/2012:  Platelets 433, otherwise CBC normal.   ASSESSMENT/PLAN:  Hypothyroidism.  Well controlled.    Diabetes mellitus with neuropathy.  Was uncontrolled.  We will review a CBG log.    Peripheral neuropathy.  Well controlled.    Coronary artery disease.  Stable.   Hypertension.  Well controlled.   Urinary retention.  Continue Flomax.    CHF.  Adequately compensated.   Check liver profile.    CPT CODE: 28413

## 2012-10-03 ENCOUNTER — Ambulatory Visit: Payer: Medicare Other | Admitting: Cardiovascular Disease

## 2012-10-09 ENCOUNTER — Telehealth: Payer: Self-pay | Admitting: Cardiovascular Disease

## 2012-10-09 ENCOUNTER — Other Ambulatory Visit: Payer: Self-pay | Admitting: *Deleted

## 2012-10-09 NOTE — Telephone Encounter (Signed)
Returning call.

## 2012-10-09 NOTE — Telephone Encounter (Signed)
Message forwarded to Scheduling as no clinical generated after reviewing chart.

## 2012-10-12 ENCOUNTER — Telehealth: Payer: Self-pay | Admitting: Cardiovascular Disease

## 2012-10-12 NOTE — Telephone Encounter (Signed)
Please call-concerning her medicine-has an answer for you.

## 2012-10-13 MED ORDER — FUROSEMIDE 20 MG PO TABS: 20.0000 mg | ORAL_TABLET | Freq: Every day | ORAL | Status: DC

## 2012-10-13 MED ORDER — FUROSEMIDE 20 MG PO TABS: 20.0000 mg | ORAL_TABLET | Freq: Two times a day (BID) | ORAL | Status: DC

## 2012-10-13 NOTE — Telephone Encounter (Signed)
Returned call.  Pt stated Dr. Tresa Endo wanted to know about her furosemide.  Stated she is taking 20 mg twice a day and needs refill.  Refill(s) sent to pharmacy.  Pt verbalized understanding and agreed w/ plan.

## 2012-11-01 ENCOUNTER — Encounter: Payer: Self-pay | Admitting: Cardiovascular Disease

## 2012-11-01 ENCOUNTER — Ambulatory Visit (INDEPENDENT_AMBULATORY_CARE_PROVIDER_SITE_OTHER): Payer: Medicare Other | Admitting: Cardiovascular Disease

## 2012-11-01 VITALS — BP 132/66 | HR 65 | Ht 68.0 in | Wt 160.7 lb

## 2012-11-01 DIAGNOSIS — E039 Hypothyroidism, unspecified: Secondary | ICD-10-CM

## 2012-11-01 DIAGNOSIS — I4892 Unspecified atrial flutter: Secondary | ICD-10-CM

## 2012-11-01 DIAGNOSIS — I10 Essential (primary) hypertension: Secondary | ICD-10-CM

## 2012-11-01 DIAGNOSIS — N183 Chronic kidney disease, stage 3 unspecified: Secondary | ICD-10-CM

## 2012-11-01 HISTORY — DX: Unspecified atrial flutter: I48.92

## 2012-11-01 NOTE — Patient Instructions (Signed)
Your physician recommends that you schedule a follow-up appointment in: 12 months.  

## 2012-11-01 NOTE — Progress Notes (Signed)
Patient ID: [name], female   DOB: 04/26/1919, 79 y.o.   MRN: [id number]       HPI: [name], is a 79 y.o. female who presents to the office today for cardiology followup evaluation. She was last seen approximately 15 months ago.  [name] has a history of atrial flutter and is status post atrial flutter ablation. She also has a history of hypertension, diabetes mellitus, mixed hyperlipidemia, hypothyroidism, as well as lower extremity edema. She has history of renal insufficiency. She's here today with her daughter.  She is unaware of recent episodes of increased heart rate. She denies recent chest pressure. She now is mostly wheelchair bound. Since I last saw her she apparently had one toe amputated on her right foot. She also may have had a small stroke in January 2014. Apparently, she has lost approximately 20 pounds of weight over the past year.   Past Medical History  Diagnosis Date  . Myocardial infarction   . Coronary artery disease   . Hypertension   . Dysrhythmia   . Peripheral vascular disease   . Hypothyroidism   . Diabetes mellitus   . Presbyesophagus 04/23/2011  . Urinary retention with incomplete bladder emptying 04/23/2011  . UTI (lower urinary tract infection) 04/21/2011  . Diastolic CHF, chronic     Grade I diastolic dysfunction on Echo 04/26/11  . Anaphylaxis 04/25/2011    Thought to be from Rocephin    Past Surgical History  Procedure Laterality Date  . Cholecystectomy    . Colonoscopy  01/15/2011    Procedure: COLONOSCOPY;  Surgeon: Theda Belfast, MD;  Location: [facility];  Service: Endoscopy;  Laterality: N/A;  . Amputation Right 04/14/2012    Procedure: AMPUTATION RAY;  Surgeon: [name], MD;  Location: [facility];  Service: Orthopedics;  Laterality: Right;  ankle block    Allergies  Allergen Reactions  . Ephedrine   . Morphine Other (See Comments)    Difficulty waking up & talking after surgery  . Rocephin [Ceftriaxone Sodium In  Dextrose]     Developed acute resp distress, wheezing after 2nd dose.    Current Outpatient Prescriptions  Medication Sig Dispense Refill  . calcium carbonate (OS-CAL - DOSED IN MG OF ELEMENTAL CALCIUM) 1250 MG tablet Take 1 tablet by mouth daily.      . carvedilol (COREG) 6.25 MG tablet Take 6.25 mg by mouth 2 (two) times daily with a meal.        . diltiazem (CARDIZEM CD) 180 MG 24 hr capsule Take 180 mg by mouth at bedtime.       . furosemide (LASIX) 20 MG tablet Take 1 tablet (20 mg total) by mouth 2 (two) times daily.  180 tablet  1  . gabapentin (NEURONTIN) 300 MG capsule Take 1 capsule (300 mg total) by mouth at bedtime.      . insulin glargine (LANTUS) 100 UNIT/ML injection Inject 28 Units into the skin at bedtime.  10 mL    . insulin lispro (HUMALOG) 100 UNIT/ML injection Inject 5 Units into the skin 3 (three) times daily before meals.      . isosorbide mononitrate (IMDUR) 30 MG 24 hr tablet Take 30 mg by mouth daily.        Marland Kitchen levothyroxine (SYNTHROID, LEVOTHROID) 200 MCG tablet Take 200 mcg by mouth every morning.       Marland Kitchen Opium Tincture, Paregoric, 2 MG/5ML TINC Take 5 mLs by mouth daily.      Marland Kitchen  pantoprazole (PROTONIX) 40 MG tablet Take 1 tablet (40 mg total) by mouth daily.  30 tablet  0  . Tamsulosin HCl (FLOMAX) 0.4 MG CAPS Take 0.4 mg by mouth every morning.        No current facility-administered medications for this visit.    History   Social History  . Marital Status: Widowed    Spouse Name: N/A    Number of Children: N/A  . Years of Education: N/A   Occupational History  . Not on file.   Social History Main Topics  . Smoking status: Former Smoker    Types: Cigarettes    Quit date: 04/20/1981  . Smokeless tobacco: Never Used  . Alcohol Use: Yes     Comment: social  . Drug Use: No  . Sexual Activity: Not Currently   Other Topics Concern  . Not on file   Social History Narrative  . No narrative on file    Family History  Problem Relation Age of Onset    . Cancer Sister   . Diabetes Mother   . Heart disease Father    Socially, she lives by herself. She is widowed has 2 children and 2 grandchildren. No tobacco or alcohol use.   ROS is negative for fevers, chills or night sweats. She is unaware of recurrent atrial flutter or palpitations. She does wear glasses. There is no new visual change. She denies wheezing. She is wheelchair-bound most of the time. She denies chest pressure. She denies recent diarrhea but last year had protracted episodes of diarrhea. She apparently has been taking her furosemide 20 mg in the morning and 20 mg at bedtime and spends several times at night waking up going to the bathroom. She time showed some mild leg swelling which has been intermittent. There is his history of possible small stroke with transient right-sided weakness in January.  Other 12 point system review is negative.  PE BP 132/66  Pulse 65  Ht 5\' 8"  (1.727 m)  Wt 160 lb 11.2 oz (72.893 kg)  BMI 24.44 kg/m2  Repeat blood pressure by me 124/70 General: Alert, oriented, no distress.  Skin: normal turgor, no rashes HEENT: Normocephalic, atraumatic. Pupils round and reactive; sclera anicteric;no lid lag.  Nose without nasal septal hypertrophy Mouth/Parynx benign; Mallinpatti scale 3 Neck: No JVD, no carotid briuts Lungs: clear to ausculatation and percussion; no wheezing or rales Heart: RRR, s1 s2 normal 1/6 systolic murmur Abdomen: soft, nontender; no hepatosplenomehaly, BS+; abdominal aorta nontender and not dilated by palpation. Pulses 2+ Extremities: Trace pretibial edema no clubbing cyanosis, Homan's sign negative  Neurologic: grossly nonfocal  ECG: Sinus rhythm at 65 beats per minute. QS complex in V1 and V2. No significant ST changes.  LABS:  BMET    Component Value Date/Time   NA 137 04/17/2012 0505   K 4.0 04/17/2012 0505   CL 104 04/17/2012 0505   CO2 24 04/17/2012 0505   GLUCOSE 145* 04/17/2012 0505   BUN 18 04/17/2012 0505    CREATININE 1.29* 04/17/2012 0505   CALCIUM 8.8 04/17/2012 0505   GFRNONAA 35* 04/17/2012 0505   GFRAA 41* 04/17/2012 0505     Hepatic Function Panel     Component Value Date/Time   PROT 6.7 02/09/2012 0617   ALBUMIN 2.7* 02/09/2012 0617   AST 10 02/09/2012 0617   ALT 9 02/09/2012 0617   ALKPHOS 63 02/09/2012 0617   BILITOT 0.3 02/09/2012 0617   BILIDIR <0.1 12/24/2011 1352   IBILI NOT CALCULATED 12/24/2011  1352     CBC    Component Value Date/Time   WBC 8.9 04/17/2012 0505   RBC 3.98 04/17/2012 0505   RBC 3.45* 06/07/2008 1040   HGB 10.9* 04/17/2012 0505   HCT 33.8* 04/17/2012 0505   PLT 374 04/17/2012 0505   MCV 84.9 04/17/2012 0505   MCH 27.4 04/17/2012 0505   MCHC 32.2 04/17/2012 0505   RDW 12.9 04/17/2012 0505   LYMPHSABS 1.7 04/12/2012 0506   MONOABS 1.3* 04/12/2012 0506   EOSABS 0.3 04/12/2012 0506   BASOSABS 0.1 04/12/2012 0506     BNP    Component Value Date/Time   PROBNP 2796.0* 04/27/2011 0450    Lipid Panel     Component Value Date/Time   CHOL 164 02/04/2012 0540   TRIG 150* 02/04/2012 0540   HDL 55 02/04/2012 0540   CHOLHDL 3.0 02/04/2012 0540   VLDL 30 02/04/2012 0540   LDLCALC 79 02/04/2012 0540     RADIOLOGY: No results found.    ASSESSMENT AND PLAN: [name] is now 79 years old. She does have remote history of atrial flutter status post ablation. She is maintaining sinus rhythm. In the past, she's had difficulty with cellulitis and abscesses of her right leg. She subsequently has had a toe amputation. She is normotensive today. She has trace peripheral edema. She has been urinating frequently at nighttime. I suggested she change the way she is taking her furosemide and take 20 mg in the morning and perhaps 20 mg at 4 PM. If she does not notice significant edema in the afternoon she we will then change her to just 20 mg in the morning. She sees [name] for primary medical care. She's not having any chest pain. He is diabetic on insulin. Her blood pressure is  controlled on current therapy. I will see her in one year for followup evaluation or sooner if problems arise.     Lennette Bihari, MD, [facility]  11/01/2012 6:51 PM

## 2012-11-02 ENCOUNTER — Encounter: Payer: Self-pay | Admitting: Cardiovascular Disease

## 2013-08-27 ENCOUNTER — Emergency Department (HOSPITAL_COMMUNITY): Payer: Medicare Other

## 2013-08-27 ENCOUNTER — Encounter (HOSPITAL_COMMUNITY): Payer: Self-pay | Admitting: Emergency Medicine

## 2013-08-27 ENCOUNTER — Inpatient Hospital Stay (HOSPITAL_COMMUNITY)
Admission: EM | Admit: 2013-08-27 | Discharge: 2013-08-30 | DRG: 640 | Disposition: A | Discharge status: Skilled Nursing Facility | Payer: Medicare Other | Attending: Family Medicine | Admitting: Family Medicine

## 2013-08-27 DIAGNOSIS — Z87891 Personal history of nicotine dependence: Secondary | ICD-10-CM | POA: Diagnosis not present

## 2013-08-27 DIAGNOSIS — I252 Old myocardial infarction: Secondary | ICD-10-CM

## 2013-08-27 DIAGNOSIS — E871 Hypo-osmolality and hyponatremia: Principal | ICD-10-CM | POA: Diagnosis present

## 2013-08-27 DIAGNOSIS — N183 Chronic kidney disease, stage 3 unspecified: Secondary | ICD-10-CM | POA: Diagnosis present

## 2013-08-27 DIAGNOSIS — Z87440 Personal history of urinary (tract) infections: Secondary | ICD-10-CM

## 2013-08-27 DIAGNOSIS — E1165 Type 2 diabetes mellitus with hyperglycemia: Secondary | ICD-10-CM

## 2013-08-27 DIAGNOSIS — Z8249 Family history of ischemic heart disease and other diseases of the circulatory system: Secondary | ICD-10-CM

## 2013-08-27 DIAGNOSIS — Z993 Dependence on wheelchair: Secondary | ICD-10-CM | POA: Diagnosis not present

## 2013-08-27 DIAGNOSIS — Z833 Family history of diabetes mellitus: Secondary | ICD-10-CM

## 2013-08-27 DIAGNOSIS — Z23 Encounter for immunization: Secondary | ICD-10-CM

## 2013-08-27 DIAGNOSIS — E039 Hypothyroidism, unspecified: Secondary | ICD-10-CM | POA: Diagnosis present

## 2013-08-27 DIAGNOSIS — I129 Hypertensive chronic kidney disease with stage 1 through stage 4 chronic kidney disease, or unspecified chronic kidney disease: Secondary | ICD-10-CM | POA: Diagnosis present

## 2013-08-27 DIAGNOSIS — R5383 Other fatigue: Secondary | ICD-10-CM | POA: Diagnosis present

## 2013-08-27 DIAGNOSIS — E1065 Type 1 diabetes mellitus with hyperglycemia: Secondary | ICD-10-CM

## 2013-08-27 DIAGNOSIS — IMO0001 Reserved for inherently not codable concepts without codable children: Secondary | ICD-10-CM | POA: Diagnosis present

## 2013-08-27 DIAGNOSIS — R471 Dysarthria and anarthria: Secondary | ICD-10-CM

## 2013-08-27 DIAGNOSIS — R5381 Other malaise: Secondary | ICD-10-CM | POA: Diagnosis not present

## 2013-08-27 DIAGNOSIS — E1149 Type 2 diabetes mellitus with other diabetic neurological complication: Secondary | ICD-10-CM

## 2013-08-27 DIAGNOSIS — E875 Hyperkalemia: Secondary | ICD-10-CM

## 2013-08-27 DIAGNOSIS — I509 Heart failure, unspecified: Secondary | ICD-10-CM | POA: Diagnosis present

## 2013-08-27 DIAGNOSIS — E11628 Type 2 diabetes mellitus with other skin complications: Secondary | ICD-10-CM

## 2013-08-27 DIAGNOSIS — I5032 Chronic diastolic (congestive) heart failure: Secondary | ICD-10-CM | POA: Diagnosis present

## 2013-08-27 DIAGNOSIS — I2510 Atherosclerotic heart disease of native coronary artery without angina pectoris: Secondary | ICD-10-CM | POA: Diagnosis present

## 2013-08-27 DIAGNOSIS — L089 Local infection of the skin and subcutaneous tissue, unspecified: Secondary | ICD-10-CM

## 2013-08-27 DIAGNOSIS — A0472 Enterocolitis due to Clostridium difficile, not specified as recurrent: Secondary | ICD-10-CM

## 2013-08-27 DIAGNOSIS — W19XXXA Unspecified fall, initial encounter: Secondary | ICD-10-CM

## 2013-08-27 DIAGNOSIS — G609 Hereditary and idiopathic neuropathy, unspecified: Secondary | ICD-10-CM

## 2013-08-27 DIAGNOSIS — G9341 Metabolic encephalopathy: Secondary | ICD-10-CM | POA: Diagnosis present

## 2013-08-27 DIAGNOSIS — K920 Hematemesis: Secondary | ICD-10-CM | POA: Diagnosis present

## 2013-08-27 DIAGNOSIS — R2689 Other abnormalities of gait and mobility: Secondary | ICD-10-CM

## 2013-08-27 DIAGNOSIS — I10 Essential (primary) hypertension: Secondary | ICD-10-CM | POA: Diagnosis present

## 2013-08-27 DIAGNOSIS — Z794 Long term (current) use of insulin: Secondary | ICD-10-CM | POA: Diagnosis not present

## 2013-08-27 DIAGNOSIS — G4700 Insomnia, unspecified: Secondary | ICD-10-CM

## 2013-08-27 DIAGNOSIS — I739 Peripheral vascular disease, unspecified: Secondary | ICD-10-CM | POA: Diagnosis present

## 2013-08-27 DIAGNOSIS — M869 Osteomyelitis, unspecified: Secondary | ICD-10-CM

## 2013-08-27 DIAGNOSIS — N390 Urinary tract infection, site not specified: Secondary | ICD-10-CM

## 2013-08-27 DIAGNOSIS — E860 Dehydration: Secondary | ICD-10-CM | POA: Diagnosis present

## 2013-08-27 DIAGNOSIS — N179 Acute kidney failure, unspecified: Secondary | ICD-10-CM

## 2013-08-27 DIAGNOSIS — E1049 Type 1 diabetes mellitus with other diabetic neurological complication: Secondary | ICD-10-CM

## 2013-08-27 DIAGNOSIS — N189 Chronic kidney disease, unspecified: Secondary | ICD-10-CM

## 2013-08-27 DIAGNOSIS — K922 Gastrointestinal hemorrhage, unspecified: Secondary | ICD-10-CM

## 2013-08-27 DIAGNOSIS — R197 Diarrhea, unspecified: Secondary | ICD-10-CM

## 2013-08-27 LAB — COMPREHENSIVE METABOLIC PANEL
ALT: 13 U/L (ref 0–35)
ANION GAP: 14 (ref 5–15)
AST: 17 U/L (ref 0–37)
Albumin: 4 g/dL (ref 3.5–5.2)
Alkaline Phosphatase: 62 U/L (ref 39–117)
BILIRUBIN TOTAL: 0.3 mg/dL (ref 0.3–1.2)
BUN: 21 mg/dL (ref 6–23)
CHLORIDE: 82 meq/L — AB (ref 96–112)
CO2: 26 meq/L (ref 19–32)
CREATININE: 1.38 mg/dL — AB (ref 0.50–1.10)
Calcium: 9.6 mg/dL (ref 8.4–10.5)
GFR calc Af Amer: 37 mL/min — ABNORMAL LOW (ref >90)
GFR, EST NON AFRICAN AMERICAN: 32 mL/min — AB (ref >90)
Glucose, Bld: 167 mg/dL — ABNORMAL HIGH (ref 70–99)
Potassium: 4.8 mEq/L (ref 3.7–5.3)
Sodium: 122 mEq/L — ABNORMAL LOW (ref 137–147)
Total Protein: 8.2 g/dL (ref 6.0–8.3)

## 2013-08-27 LAB — URINALYSIS, ROUTINE W REFLEX MICROSCOPIC
BILIRUBIN URINE: NEGATIVE
Glucose, UA: 100 mg/dL — AB
Hgb urine dipstick: NEGATIVE
Ketones, ur: NEGATIVE mg/dL
Nitrite: NEGATIVE
PROTEIN: 30 mg/dL — AB
Specific Gravity, Urine: 1.007 (ref 1.005–1.030)
UROBILINOGEN UA: 0.2 mg/dL (ref 0.0–1.0)
pH: 7.5 (ref 5.0–8.0)

## 2013-08-27 LAB — CBC WITH DIFFERENTIAL/PLATELET
BASOS PCT: 1 % (ref 0–1)
Basophils Absolute: 0.1 10*3/uL (ref 0.0–0.1)
EOS PCT: 2 % (ref 0–5)
Eosinophils Absolute: 0.2 10*3/uL (ref 0.0–0.7)
HEMATOCRIT: 37 % (ref 36.0–46.0)
HEMOGLOBIN: 12.7 g/dL (ref 12.0–15.0)
LYMPHS PCT: 10 % — AB (ref 12–46)
Lymphs Abs: 1.2 10*3/uL (ref 0.7–4.0)
MCH: 28.2 pg (ref 26.0–34.0)
MCHC: 34.3 g/dL (ref 30.0–36.0)
MCV: 82.2 fL (ref 78.0–100.0)
MONO ABS: 1 10*3/uL (ref 0.1–1.0)
MONOS PCT: 8 % (ref 3–12)
NEUTROS ABS: 9.4 10*3/uL — AB (ref 1.7–7.7)
Neutrophils Relative %: 79 % — ABNORMAL HIGH (ref 43–77)
Platelets: 348 10*3/uL (ref 150–400)
RBC: 4.5 MIL/uL (ref 3.87–5.11)
RDW: 13.6 % (ref 11.5–15.5)
WBC: 11.9 10*3/uL — ABNORMAL HIGH (ref 4.0–10.5)

## 2013-08-27 LAB — URINE MICROSCOPIC-ADD ON

## 2013-08-27 LAB — PRO B NATRIURETIC PEPTIDE: Pro B Natriuretic peptide (BNP): 231 pg/mL (ref 0–450)

## 2013-08-27 LAB — OCCULT BLOOD GASTRIC / DUODENUM (SPECIMEN CUP)
Occult Blood, Gastric: POSITIVE — AB
pH, Gastric: 1

## 2013-08-27 LAB — CBG MONITORING, ED: Glucose-Capillary: 210 mg/dL — ABNORMAL HIGH (ref 70–99)

## 2013-08-27 LAB — TROPONIN I: Troponin I: <0.3 ng/mL (ref <0.30)

## 2013-08-27 LAB — LIPASE, BLOOD: LIPASE: 11 U/L (ref 11–59)

## 2013-08-27 MED ORDER — ONDANSETRON HCL 4 MG/2ML IJ SOLN
INTRAMUSCULAR | Status: AC
  Administered 2013-08-27: 4 mg
  Filled 2013-08-27: qty 2

## 2013-08-27 MED ORDER — ONDANSETRON 4 MG PO TBDP: 2.0000 mg | ORAL_TABLET | Freq: Once | ORAL | Status: DC

## 2013-08-27 MED ORDER — SODIUM CHLORIDE 0.9 % IV SOLN
Freq: Once | INTRAVENOUS | Status: AC
  Administered 2013-08-27: 21:00:00 via INTRAVENOUS

## 2013-08-27 NOTE — ED Notes (Signed)
Pt presents to ED with c/o vomiting.  Pt denies abdominal pain but did say "I'm hungry".  Pt was actively having emesis on arrival--- vomitus reddish brown in color, with some skins of red tomato present.

## 2013-08-27 NOTE — ED Notes (Signed)
Brought gastric contents to lab for testing

## 2013-08-27 NOTE — ED Provider Notes (Signed)
CSN: [id number]     Arrival date & time 08/27/13  2043 History   First MD Initiated Contact with Patient 08/27/13 2054     Chief Complaint  Patient presents with  . Emesis     (Consider location/radiation/quality/duration/timing/severity/associated sxs/prior Treatment) The history is provided by the patient and a relative. No language interpreter was used.  [name] is a 80 y/o with PMHx of MI, CAD, HTN, dysrhythmia, diabetes, urinary retention presenting to the ED with nausea and vomiting that started at approximately 5:00 PM this evening. Patient is complete by daughter, reported that patient had continuous vomiting-appear to be red in color. As per daughter, reported that patient is been feeling weak and fatigued. Stated that patient currently lives at home alone, is wheelchair-bound. Daughter reported that patient was seen and assessed by her primary care provider and was diagnosed with urinary tract infection-ciprofloxacin started for 3 days. As per daughter, reported that patient had a fall in the elevator on Friday where she hit her. Stated the patient has been having intermittent episodes of altered mental status. ROS limited secondary to mental status as well as stated patient-appears to be extremely lethargic. Denied abdominal pain, chest pain, shortness of breath, difficulty breathing, hematochezia, melena, hematuria, dysuria. PCP Dr. Maudie Mercury  Past Medical History  Diagnosis Date  . Myocardial infarction   . Coronary artery disease   . Hypertension   . Dysrhythmia   . Peripheral vascular disease   . Hypothyroidism   . Diabetes mellitus   . Presbyesophagus 04/23/2011  . Urinary retention with incomplete bladder emptying 04/23/2011  . UTI (lower urinary tract infection) 04/21/2011  . Diastolic CHF, chronic     Grade I diastolic dysfunction on Echo 04/26/11  . Anaphylaxis 04/25/2011    Thought to be from Rocephin   Past Surgical History  Procedure Laterality Date  .  Cholecystectomy    . Colonoscopy  01/15/2011    Procedure: COLONOSCOPY;  Surgeon: Beryle Beams, MD;  Location: [facility];  Service: Endoscopy;  Laterality: N/A;  . Amputation Right 04/14/2012    Procedure: AMPUTATION RAY;  Surgeon: Newt Minion, MD;  Location: [facility];  Service: Orthopedics;  Laterality: Right;  ankle block   Family History  Problem Relation Age of Onset  . Cancer Sister   . Diabetes Mother   . Heart disease Father    History  Substance Use Topics  . Smoking status: Former Smoker    Types: Cigarettes    Quit date: 04/20/1981  . Smokeless tobacco: Never Used  . Alcohol Use: Yes     Comment: social   OB History   Grav Para Term Preterm Abortions TAB SAB Ect Mult Living                 Review of Systems  Constitutional: Positive for fatigue. Negative for fever and chills.  Respiratory: Negative for cough and chest tightness.   Cardiovascular: Negative for chest pain.  Gastrointestinal: Positive for nausea and vomiting. Negative for abdominal pain, diarrhea, constipation, blood in stool and anal bleeding.  Neurological: Positive for weakness.      Allergies  Ephedrine; Morphine; and Rocephin  Home Medications   Prior to Admission medications   Medication Sig Start Date End Date Taking? Authorizing Provider  carvedilol (COREG) 6.25 MG tablet Take 6.25 mg by mouth 2 (two) times daily with a meal.     Yes Historical Provider, MD  diltiazem (CARDIZEM CD) 180 MG 24 hr capsule Take 180 mg by  mouth at bedtime.    Yes Historical Provider, MD  furosemide (LASIX) 20 MG tablet Take 1 tablet (20 mg total) by mouth 2 (two) times daily. 10/13/12  Yes Troy Sine, MD  gabapentin (NEURONTIN) 300 MG capsule Take 300 mg by mouth 2 (two) times daily. 02/17/12  Yes Ivan Anchors Love, PA-C  insulin glargine (LANTUS) 100 UNIT/ML injection Inject 28 Units into the skin every morning. 02/17/12  Yes Ivan Anchors Love, PA-C  insulin lispro (HUMALOG) 100 UNIT/ML injection Inject 4 Units  into the skin every morning.    Yes Historical Provider, MD  isosorbide mononitrate (IMDUR) 30 MG 24 hr tablet Take 30 mg by mouth daily.     Yes Historical Provider, MD  levothyroxine (SYNTHROID, LEVOTHROID) 175 MCG tablet Take 175 mcg by mouth daily before breakfast.   Yes Historical Provider, MD  Opium Tincture, Paregoric, 2 MG/5ML TINC Take 5 mLs by mouth daily as needed.    Yes Historical Provider, MD  Tamsulosin HCl (FLOMAX) 0.4 MG CAPS Take 0.4 mg by mouth every morning.    Yes Historical Provider, MD   BP 163/53  Pulse 65  Temp(Src) 97.3 F (36.3 C) (Oral)  Resp 16  Ht 5\' 8"  (1.727 m)  Wt 162 lb 11.2 oz (73.8 kg)  BMI 24.74 kg/m2  SpO2 100% Physical Exam  Nursing note and vitals reviewed. Constitutional: She is oriented to person, place, and time. She appears well-developed and well-nourished. No distress.  Frail appearing elderly woman Appears lethargic  HENT:  Head: Normocephalic and atraumatic.  Mouth/Throat: Oropharynx is clear and moist. No oropharyngeal exudate.  Eyes: Conjunctivae and EOM are normal. Pupils are equal, round, and reactive to light. Right eye exhibits no discharge. Left eye exhibits no discharge.  Neck: Normal range of motion. Neck supple. No tracheal deviation present.  Negative neck stiffness Negative nuchal rigidity Cervical lymphadenopathy Negative meningeal signs  Cardiovascular: Normal rate, regular rhythm and normal heart sounds.  Exam reveals no friction rub.   No murmur heard.  Cap refill less than 3 seconds  Pulmonary/Chest: Effort normal. No respiratory distress. She has no wheezes. She has no rales.  Rhonchi identified to upper and lower lobes bilaterally Shallow lip breathing  Abdominal: Soft. Bowel sounds are normal. She exhibits no distension. There is no tenderness. There is no rebound and no guarding.  Negative abdominal distention Bowel sounds normal active in all 4 quadrants Abdomen soft upon palpation Negative guarding Negative  rigidity Negative peritoneal signs  Emesis visualized-appears to be red in color. Nonbilious.  Musculoskeletal: Normal range of motion.  Lymphadenopathy:    She has no cervical adenopathy.  Neurological: She is alert and oriented to person, place, and time. No cranial nerve deficit. She exhibits normal muscle tone. Coordination normal.  Skin: Skin is warm and dry. No rash noted. She is not diaphoretic. No erythema.  Psychiatric: She has a normal mood and affect. Her behavior is normal. Thought content normal.    ED Course  Procedures (including critical care time)  11:36 PM This provider spoke with Dr. Venia Minks - discussed labs, imaging, vitals in great detail. Recommended patient to be admitted to Telemetry for hyponatremia. Recommended GI consult.   11:51 PM This provider spoke with Dr. Hilarie Fredrickson - GI - discussed case, labs, imaging in great detail. GI to consult - recommended PPIs.   Results for orders placed during the hospital encounter of 08/27/13  CBC WITH DIFFERENTIAL      Result Value Ref Range   WBC  11.9 (*) 4.0 - 10.5 K/uL   RBC 4.50  3.87 - 5.11 MIL/uL   Hemoglobin 12.7  12.0 - 15.0 g/dL   HCT 37.0  36.0 - 46.0 %   MCV 82.2  78.0 - 100.0 fL   MCH 28.2  26.0 - 34.0 pg   MCHC 34.3  30.0 - 36.0 g/dL   RDW 13.6  11.5 - 15.5 %   Platelets 348  150 - 400 K/uL   Neutrophils Relative % 79 (*) 43 - 77 %   Neutro Abs 9.4 (*) 1.7 - 7.7 K/uL   Lymphocytes Relative 10 (*) 12 - 46 %   Lymphs Abs 1.2  0.7 - 4.0 K/uL   Monocytes Relative 8  3 - 12 %   Monocytes Absolute 1.0  0.1 - 1.0 K/uL   Eosinophils Relative 2  0 - 5 %   Eosinophils Absolute 0.2  0.0 - 0.7 K/uL   Basophils Relative 1  0 - 1 %   Basophils Absolute 0.1  0.0 - 0.1 K/uL  COMPREHENSIVE METABOLIC PANEL      Result Value Ref Range   Sodium 122 (*) 137 - 147 mEq/L   Potassium 4.8  3.7 - 5.3 mEq/L   Chloride 82 (*) 96 - 112 mEq/L   CO2 26  19 - 32 mEq/L   Glucose, Bld 167 (*) 70 - 99 mg/dL   BUN 21  6 - 23 mg/dL    Creatinine, Ser 1.38 (*) 0.50 - 1.10 mg/dL   Calcium 9.6  8.4 - 10.5 mg/dL   Total Protein 8.2  6.0 - 8.3 g/dL   Albumin 4.0  3.5 - 5.2 g/dL   AST 17  0 - 37 U/L   ALT 13  0 - 35 U/L   Alkaline Phosphatase 62  39 - 117 U/L   Total Bilirubin 0.3  0.3 - 1.2 mg/dL   GFR calc non Af Amer 32 (*) >90 mL/min   GFR calc Af Amer 37 (*) >90 mL/min   Anion gap 14  5 - 15  LIPASE, BLOOD      Result Value Ref Range   Lipase 11  11 - 59 U/L  TROPONIN I      Result Value Ref Range   Troponin I <0.30  <0.30 ng/mL  URINALYSIS, ROUTINE W REFLEX MICROSCOPIC      Result Value Ref Range   Color, Urine YELLOW  YELLOW   APPearance CLEAR  CLEAR   Specific Gravity, Urine 1.007  1.005 - 1.030   pH 7.5  5.0 - 8.0   Glucose, UA 100 (*) NEGATIVE mg/dL   Hgb urine dipstick NEGATIVE  NEGATIVE   Bilirubin Urine NEGATIVE  NEGATIVE   Ketones, ur NEGATIVE  NEGATIVE mg/dL   Protein, ur 30 (*) NEGATIVE mg/dL   Urobilinogen, UA 0.2  0.0 - 1.0 mg/dL   Nitrite NEGATIVE  NEGATIVE   Leukocytes, UA TRACE (*) NEGATIVE  PRO B NATRIURETIC PEPTIDE      Result Value Ref Range   Pro B Natriuretic peptide (BNP) 231.0  0 - 450 pg/mL  OCCULT BLOOD GASTRIC / DUODENUM (SPECIMEN CUP)      Result Value Ref Range   pH, Gastric 1     Occult Blood, Gastric POSITIVE (*) NEGATIVE  URINE MICROSCOPIC-ADD ON      Result Value Ref Range   Squamous Epithelial / LPF RARE  RARE   WBC, UA 3-6  <3 WBC/hpf   RBC / HPF 0-2  <3  RBC/hpf  GLUCOSE, CAPILLARY      Result Value Ref Range   Glucose-Capillary 226 (*) 70 - 99 mg/dL  CBG MONITORING, ED      Result Value Ref Range   Glucose-Capillary 210 (*) 70 - 99 mg/dL    Labs Review Labs Reviewed  CBC WITH DIFFERENTIAL - Abnormal; Notable for the following:    WBC 11.9 (*)    Neutrophils Relative % 79 (*)    Neutro Abs 9.4 (*)    Lymphocytes Relative 10 (*)    All other components within normal limits  COMPREHENSIVE METABOLIC PANEL - Abnormal; Notable for the following:    Sodium  122 (*)    Chloride 82 (*)    Glucose, Bld 167 (*)    Creatinine, Ser 1.38 (*)    GFR calc non Af Amer 32 (*)    GFR calc Af Amer 37 (*)    All other components within normal limits  URINALYSIS, ROUTINE W REFLEX MICROSCOPIC - Abnormal; Notable for the following:    Glucose, UA 100 (*)    Protein, ur 30 (*)    Leukocytes, UA TRACE (*)    All other components within normal limits  OCCULT BLOOD GASTRIC / DUODENUM (SPECIMEN CUP) - Abnormal; Notable for the following:    Occult Blood, Gastric POSITIVE (*)    All other components within normal limits  GLUCOSE, CAPILLARY - Abnormal; Notable for the following:    Glucose-Capillary 226 (*)    All other components within normal limits  CBG MONITORING, ED - Abnormal; Notable for the following:    Glucose-Capillary 210 (*)    All other components within normal limits  LIPASE, BLOOD  TROPONIN I  PRO B NATRIURETIC PEPTIDE  URINE MICROSCOPIC-ADD ON  TSH  OSMOLALITY  CORTISOL  BASIC METABOLIC PANEL  CBC  CREATININE, URINE, RANDOM  SODIUM, URINE, RANDOM  OSMOLALITY, URINE  POCT GASTRIC OCCULT BLOOD (1-CARD TO LAB)    Imaging Review Ct Head Wo Contrast  08/27/2013   CLINICAL DATA:  Emesis.  EXAM: CT HEAD WITHOUT CONTRAST  CT CERVICAL SPINE WITHOUT CONTRAST  TECHNIQUE: Multidetector CT imaging of the head and cervical spine was performed following the standard protocol without intravenous contrast. Multiplanar CT image reconstructions of the cervical spine were also generated.  COMPARISON:  02/03/2012 head CT cervical spine radiography 05/20/2009  FINDINGS: CT HEAD FINDINGS  Skull and Sinuses:No acute fracture destructive process. Chronic mastoiditis in the left mastoid tip.  Orbits: No acute findings.  Bilateral cataract resection.  Brain: No evidence of acute abnormality, such as acute infarction, hemorrhage, hydrocephalus, or mass lesion/mass effect. Generalized brain atrophy and chronic small vessel disease, expected for age.  CT CERVICAL SPINE  FINDINGS  Compression fractures of T2 and T3 with less than 50% height loss. Although new from 02/21/2008, these appear remote based on lack of visible fracture line or perispinal edema. Mild retropulsion may be present at T3, although there is partial imaging.  No acute cervical spine fracture. Chronic C4-5 anterolisthesis associated with advanced facet osteoarthritis and degenerative disc disease. C5-6 ankylosis of the bodies and posterior elements. Diffuse facet osteoarthritis and degenerative disc change. No gross cervical canal hematoma or prevertebral edema. No advanced osseous canal stenosis.  IMPRESSION: 1. No acute intracranial abnormality. 2. Age related brain atrophy and chronic small vessel disease. 3. No acute cervical spine findings. 4. T2 and T3 compression fractures, favored remote. Correlate with thoracic spine exam.   Electronically Signed   By: Gilford Silvius.D.  On: 08/27/2013 22:52   Ct Cervical Spine Wo Contrast  08/27/2013   CLINICAL DATA:  Emesis.  EXAM: CT HEAD WITHOUT CONTRAST  CT CERVICAL SPINE WITHOUT CONTRAST  TECHNIQUE: Multidetector CT imaging of the head and cervical spine was performed following the standard protocol without intravenous contrast. Multiplanar CT image reconstructions of the cervical spine were also generated.  COMPARISON:  02/03/2012 head CT cervical spine radiography 05/20/2009  FINDINGS: CT HEAD FINDINGS  Skull and Sinuses:No acute fracture destructive process. Chronic mastoiditis in the left mastoid tip.  Orbits: No acute findings.  Bilateral cataract resection.  Brain: No evidence of acute abnormality, such as acute infarction, hemorrhage, hydrocephalus, or mass lesion/mass effect. Generalized brain atrophy and chronic small vessel disease, expected for age.  CT CERVICAL SPINE FINDINGS  Compression fractures of T2 and T3 with less than 50% height loss. Although new from 02/21/2008, these appear remote based on lack of visible fracture line or perispinal  edema. Mild retropulsion may be present at T3, although there is partial imaging.  No acute cervical spine fracture. Chronic C4-5 anterolisthesis associated with advanced facet osteoarthritis and degenerative disc disease. C5-6 ankylosis of the bodies and posterior elements. Diffuse facet osteoarthritis and degenerative disc change. No gross cervical canal hematoma or prevertebral edema. No advanced osseous canal stenosis.  IMPRESSION: 1. No acute intracranial abnormality. 2. Age related brain atrophy and chronic small vessel disease. 3. No acute cervical spine findings. 4. T2 and T3 compression fractures, favored remote. Correlate with thoracic spine exam.   Electronically Signed   By: Jorje Guild M.D.   On: 08/27/2013 22:52   Dg Chest Port 1 View  08/27/2013   CLINICAL DATA:  Vomiting and altered mental status  EXAM: PORTABLE CHEST - 1 VIEW  COMPARISON:  02/04/2012  FINDINGS: Negative for heart failure. Negative for pneumonia or effusion. Increased lung markings appear chronic bilaterally.  IMPRESSION: No active disease.   Electronically Signed   By: Franchot Gallo M.D.   On: 08/27/2013 22:22     EKG Interpretation   Date/Time:  Monday August 27 2013 21:03:39 EDT Ventricular Rate:  58 PR Interval:  204 QRS Duration: 88 QT Interval:  456 QTC Calculation: 448 R Axis:   37 Text Interpretation:  Sinus rhythm Anteroseptal infarct, age indeterminate  Abnormal ekg since last tracing no significant change Confirmed by Sabra Heck   MD, BRIAN ([id number]) on 08/27/2013 10:45:39 PM      MDM   Final diagnoses:  Hyponatremia  Hematemesis with nausea  Upper GI bleed    Medications  gabapentin (NEURONTIN) capsule 300 mg (300 mg Oral Given 08/28/13 0126)  insulin glargine (LANTUS) injection 28 Units (not administered)  levothyroxine (SYNTHROID, LEVOTHROID) tablet 175 mcg (not administered)  diltiazem (CARDIZEM CD) 24 hr capsule 180 mg (180 mg Oral Given 08/28/13 0125)  carvedilol (COREG) tablet 6.25 mg (not  administered)  isosorbide mononitrate (IMDUR) 24 hr tablet 30 mg (not administered)  tamsulosin (FLOMAX) capsule 0.4 mg (not administered)  0.9 %  sodium chloride infusion ( Intravenous New Bag/Given 08/28/13 0100)  ondansetron (ZOFRAN) tablet 4 mg (not administered)    Or  ondansetron (ZOFRAN) injection 4 mg (not administered)  insulin aspart (novoLOG) injection 0-9 Units (not administered)  pantoprazole (PROTONIX) injection 40 mg (not administered)  pneumococcal 23 valent vaccine (PNU-IMMUNE) injection 0.5 mL (not administered)  0.9 %  sodium chloride infusion ( Intravenous Stopped 08/27/13 2241)  ondansetron (ZOFRAN) 4 MG/2ML injection (4 mg  Given 08/27/13 2125)  pantoprazole (PROTONIX) injection 40 mg (40  mg Intravenous Given 08/28/13 0126)   Filed Vitals:   08/28/13 0020 08/28/13 0023 08/28/13 0046 08/28/13 0212  BP: 180/57  188/53 163/53  Pulse: 68 67 71 65  Temp:  97.9 F (36.6 C) 97.3 F (36.3 C)   TempSrc:  Oral Oral   Resp: 15  16   Height:   5\' 8"  (1.727 m)   Weight:   162 lb 11.2 oz (73.8 kg)   SpO2: 99% 98% 100%    EKG noted sinus rhythm with a heart rate of 58 beats per minute. Troponin negative elevation. BNP negative elevation to 231. CBC noted mild bump in white blood cell count of 11.9 with neutrophils 9.4. Hemoglobin and hematocrit stable - 12.7, hematocrit 37.0. CMP noted hyponatremia 0.22. Elevated creatinine 1.38. Chloride of 82. Anion gap-14.0 mEq per liter. Gastric occult positive. Urinalysis noted trace of leukocytes with white blood cell count of 3-6. Negative hemoglobin, nitrites identified. CT head negative for acute intracranial abnormalities, age related brain atrophy noted. CT cervical spine negative for acute abnormalities-T2 and T3 compression fractures-favored to be remote. Chest x-ray negative for acute cardiopulmonary disease-negative for pneumonia or effusion. Upon arrival to the ED, patient's pulse ox was 89% on room air-patient was placed on oxygen via  nasal cannula 2 L per minute, increased to 99%. Patients are on IV fluids, Zofran administered. Patient to be admitted to the hospital regarding hyponatremia, hematemesis. Discussed plan for admission with patient and family at bedside who agree to plan of care. Discussed case with [facility]ists- [facility] to admit. GI to consult.  [employer], PA-C 08/28/13 [phone]

## 2013-08-27 NOTE — ED Provider Notes (Signed)
80 year old female present with altered mental status and persistent vomiting times one day. Family members with the patient state that she had fallen Friday bumping her head in an elevator also states she was treated for a urinary tract infection at her family doctor's office. She has been taking ciprofloxacin. The patient is unaware of any of my questions as she is very somnolent and difficult to arouse. Her lungs sound slightly wet, her abdomen is nontender, nondistended, and mild bilateral symmetrical peripheral edema. Heart sounds are normal without murmurs. Pulses are normal. Pupils are 2 mm and minimally reactive bilaterally.  We'll start evaluation for the cause of her altered mental status and vomiting. This will include CT scan of the brain, labs, urinalysis, EKG. Family members are in agreement with the plan. Anticipate admission to the hospital.  Medical screening examination/treatment/procedure(s) were conducted as a shared visit with non-physician practitioner(s) and myself.  I personally evaluated the patient during the encounter.  Clinical Impression:   #1 Hyponatremia #2 Hematemesis      Johnna Acosta, MD 08/28/13 1116

## 2013-08-28 ENCOUNTER — Encounter (HOSPITAL_COMMUNITY): Payer: Self-pay

## 2013-08-28 DIAGNOSIS — K920 Hematemesis: Secondary | ICD-10-CM

## 2013-08-28 DIAGNOSIS — E871 Hypo-osmolality and hyponatremia: Principal | ICD-10-CM

## 2013-08-28 DIAGNOSIS — W19XXXA Unspecified fall, initial encounter: Secondary | ICD-10-CM

## 2013-08-28 DIAGNOSIS — R110 Nausea: Secondary | ICD-10-CM

## 2013-08-28 DIAGNOSIS — E860 Dehydration: Secondary | ICD-10-CM

## 2013-08-28 LAB — OSMOLALITY, URINE: Osmolality, Ur: 178 mOsm/kg — ABNORMAL LOW (ref 390–1090)

## 2013-08-28 LAB — BASIC METABOLIC PANEL
ANION GAP: 13 (ref 5–15)
ANION GAP: 12 (ref 5–15)
Anion gap: 11 (ref 5–15)
BUN: 19 mg/dL (ref 6–23)
BUN: 17 mg/dL (ref 6–23)
BUN: 19 mg/dL (ref 6–23)
CHLORIDE: 84 meq/L — AB (ref 96–112)
CO2: 26 mEq/L (ref 19–32)
CO2: 24 meq/L (ref 19–32)
CO2: 24 mEq/L (ref 19–32)
CREATININE: 1.18 mg/dL — AB (ref 0.50–1.10)
CREATININE: 1.4 mg/dL — AB (ref 0.50–1.10)
Calcium: 9 mg/dL (ref 8.4–10.5)
Calcium: 8.5 mg/dL (ref 8.4–10.5)
Calcium: 8.6 mg/dL (ref 8.4–10.5)
Chloride: 84 mEq/L — ABNORMAL LOW (ref 96–112)
Chloride: 85 mEq/L — ABNORMAL LOW (ref 96–112)
Creatinine, Ser: 1.24 mg/dL — ABNORMAL HIGH (ref 0.50–1.10)
GFR calc Af Amer: 45 mL/min — ABNORMAL LOW (ref >90)
GFR calc Af Amer: 37 mL/min — ABNORMAL LOW (ref >90)
GFR calc non Af Amer: 37 mL/min — ABNORMAL LOW (ref >90)
GFR calc non Af Amer: 39 mL/min — ABNORMAL LOW (ref >90)
GFR calc non Af Amer: 32 mL/min — ABNORMAL LOW (ref >90)
GFR, EST AFRICAN AMERICAN: 42 mL/min — AB (ref >90)
Glucose, Bld: 230 mg/dL — ABNORMAL HIGH (ref 70–99)
Glucose, Bld: 245 mg/dL — ABNORMAL HIGH (ref 70–99)
Glucose, Bld: 192 mg/dL — ABNORMAL HIGH (ref 70–99)
Potassium: 4.8 mEq/L (ref 3.7–5.3)
Potassium: 5.1 mEq/L (ref 3.7–5.3)
Potassium: 4.9 mEq/L (ref 3.7–5.3)
SODIUM: 119 meq/L — AB (ref 137–147)
Sodium: 123 mEq/L — ABNORMAL LOW (ref 137–147)
Sodium: 121 mEq/L — CL (ref 137–147)

## 2013-08-28 LAB — CBC
HCT: 36.5 % (ref 36.0–46.0)
Hemoglobin: 12.2 g/dL (ref 12.0–15.0)
MCH: 27.4 pg (ref 26.0–34.0)
MCHC: 33.4 g/dL (ref 30.0–36.0)
MCV: 82 fL (ref 78.0–100.0)
PLATELETS: 332 10*3/uL (ref 150–400)
RBC: 4.45 MIL/uL (ref 3.87–5.11)
RDW: 13.5 % (ref 11.5–15.5)
WBC: 15.2 10*3/uL — ABNORMAL HIGH (ref 4.0–10.5)

## 2013-08-28 LAB — SODIUM, URINE, RANDOM

## 2013-08-28 LAB — GLUCOSE, CAPILLARY
GLUCOSE-CAPILLARY: 205 mg/dL — AB (ref 70–99)
GLUCOSE-CAPILLARY: 154 mg/dL — AB (ref 70–99)
Glucose-Capillary: 226 mg/dL — ABNORMAL HIGH (ref 70–99)
Glucose-Capillary: 208 mg/dL — ABNORMAL HIGH (ref 70–99)
Glucose-Capillary: 211 mg/dL — ABNORMAL HIGH (ref 70–99)

## 2013-08-28 LAB — CORTISOL: Cortisol, Plasma: 22.6 ug/dL

## 2013-08-28 LAB — TSH: TSH: 0.769 u[IU]/mL (ref 0.350–4.500)

## 2013-08-28 LAB — CREATININE, URINE, RANDOM: CREATININE, URINE: 35.8 mg/dL

## 2013-08-28 LAB — OSMOLALITY: Osmolality: 264 mOsm/kg — ABNORMAL LOW (ref 275–300)

## 2013-08-28 MED ORDER — PANTOPRAZOLE SODIUM 40 MG IV SOLR
40.0000 mg | Freq: Two times a day (BID) | INTRAVENOUS | Status: DC
  Administered 2013-08-28 – 2013-08-30 (×4): 40 mg via INTRAVENOUS
  Filled 2013-08-28 (×5): qty 40

## 2013-08-28 MED ORDER — INSULIN ASPART 100 UNIT/ML ~~LOC~~ SOLN
0.0000 [IU] | Freq: Three times a day (TID) | SUBCUTANEOUS | Status: DC
  Administered 2013-08-28 (×3): 3 [IU] via SUBCUTANEOUS
  Administered 2013-08-29: 2 [IU] via SUBCUTANEOUS
  Administered 2013-08-29: 3 [IU] via SUBCUTANEOUS
  Administered 2013-08-30: 5 [IU] via SUBCUTANEOUS

## 2013-08-28 MED ORDER — SODIUM CHLORIDE 0.9 % IV SOLN
INTRAVENOUS | Status: DC
  Administered 2013-08-28 – 2013-08-30 (×4): via INTRAVENOUS

## 2013-08-28 MED ORDER — LEVOTHYROXINE SODIUM 175 MCG PO TABS
175.0000 ug | ORAL_TABLET | Freq: Every day | ORAL | Status: DC
  Administered 2013-08-28 – 2013-08-30 (×3): 175 ug via ORAL
  Filled 2013-08-28 (×4): qty 1

## 2013-08-28 MED ORDER — SILVER SULFADIAZINE 1 % EX CREA
TOPICAL_CREAM | Freq: Every day | CUTANEOUS | Status: DC
  Administered 2013-08-28: 18:00:00 via TOPICAL
  Administered 2013-08-29: 1 via TOPICAL
  Administered 2013-08-30: 10:00:00 via TOPICAL
  Filled 2013-08-28: qty 85

## 2013-08-28 MED ORDER — CARVEDILOL 6.25 MG PO TABS
6.2500 mg | ORAL_TABLET | Freq: Two times a day (BID) | ORAL | Status: DC
  Administered 2013-08-28 – 2013-08-30 (×5): 6.25 mg via ORAL
  Filled 2013-08-28 (×7): qty 1

## 2013-08-28 MED ORDER — PANTOPRAZOLE SODIUM 40 MG IV SOLR
40.0000 mg | Freq: Once | INTRAVENOUS | Status: AC
  Administered 2013-08-28: 40 mg via INTRAVENOUS
  Filled 2013-08-28: qty 40

## 2013-08-28 MED ORDER — INSULIN GLARGINE 100 UNIT/ML ~~LOC~~ SOLN
28.0000 [IU] | Freq: Every morning | SUBCUTANEOUS | Status: DC
  Administered 2013-08-28 – 2013-08-30 (×3): 28 [IU] via SUBCUTANEOUS
  Filled 2013-08-28 (×3): qty 0.28

## 2013-08-28 MED ORDER — ZOLPIDEM TARTRATE 5 MG PO TABS
5.0000 mg | ORAL_TABLET | Freq: Once | ORAL | Status: AC
  Administered 2013-08-28: 5 mg via ORAL
  Filled 2013-08-28: qty 1

## 2013-08-28 MED ORDER — TAMSULOSIN HCL 0.4 MG PO CAPS
0.4000 mg | ORAL_CAPSULE | Freq: Every morning | ORAL | Status: DC
  Administered 2013-08-28 – 2013-08-30 (×3): 0.4 mg via ORAL
  Filled 2013-08-28 (×3): qty 1

## 2013-08-28 MED ORDER — DILTIAZEM HCL ER COATED BEADS 180 MG PO CP24
180.0000 mg | ORAL_CAPSULE | Freq: Every day | ORAL | Status: DC
  Administered 2013-08-28 – 2013-08-29 (×2): 180 mg via ORAL
  Filled 2013-08-28 (×4): qty 1

## 2013-08-28 MED ORDER — ISOSORBIDE MONONITRATE ER 30 MG PO TB24
30.0000 mg | ORAL_TABLET | Freq: Every day | ORAL | Status: DC
  Administered 2013-08-28 – 2013-08-30 (×2): 30 mg via ORAL
  Filled 2013-08-28 (×3): qty 1

## 2013-08-28 MED ORDER — PNEUMOCOCCAL VAC POLYVALENT 25 MCG/0.5ML IJ INJ
0.5000 mL | INJECTION | INTRAMUSCULAR | Status: DC
  Filled 2013-08-28 (×2): qty 0.5

## 2013-08-28 MED ORDER — ONDANSETRON HCL 4 MG PO TABS: 4.0000 mg | ORAL_TABLET | Freq: Four times a day (QID) | ORAL | Status: DC | PRN

## 2013-08-28 MED ORDER — ONDANSETRON HCL 4 MG/2ML IJ SOLN: 4.0000 mg | Freq: Four times a day (QID) | INTRAMUSCULAR | Status: DC | PRN

## 2013-08-28 MED ORDER — GABAPENTIN 300 MG PO CAPS
300.0000 mg | ORAL_CAPSULE | Freq: Two times a day (BID) | ORAL | Status: DC
  Administered 2013-08-28 – 2013-08-30 (×6): 300 mg via ORAL
  Filled 2013-08-28 (×7): qty 1

## 2013-08-28 NOTE — Progress Notes (Signed)
Critical lab value of NA 119. NP on call notified. No new orders placed. [name] stable. Will continue to monitor closely.

## 2013-08-28 NOTE — Evaluation (Signed)
Physical Therapy Evaluation Patient Details Name: [name] MRN: [id number] DOB: [birth year] Today's Date: 08/28/2013   History of Present Illness  80 yo female admitted 08/27/13 with worsening AMS, fall, recent UTI.  Clinical Impression  Pt actually mobilized better than anticipated and ambulated x 90'. Pt will benefit from PT to address functional  problems listed in the note below.     Follow Up Recommendations SNF;Supervision/Assistance - 24 hour    Equipment Recommendations  None recommended by PT    Recommendations for Other Services       Precautions / Restrictions Precautions Precautions: Fall Restrictions Weight Bearing Restrictions:  (wheelchair bound per pt due to hx CVA;walker to BR)      Mobility  Bed Mobility Overal bed mobility: Needs Assistance Bed Mobility: Supine to Sit     Supine to sit: Supervision;HOB elevated     General bed mobility comments: use of rail, not external support required.  Transfers Overall transfer level: Needs assistance Equipment used: Rolling walker (2 wheeled) Transfers: Sit to/from [facility] Sit to Stand: Mod assist Stand pivot transfers: Mod assist       General transfer comment: pt transfered fom bed to [facility] without RW., cues for UE position, safety sitting down, decreased descent to recliner.  Ambulation/Gait Ambulation/Gait assistance: Mod assist Ambulation Distance (Feet): 90 Feet Assistive device: Rolling walker (2 wheeled) Gait Pattern/deviations: Step-through pattern;Shuffle;Decreased weight shift to right;Decreased weight shift to left;Festinating   Gait velocity interpretation: <1.8 ft/sec, indicative of risk for recurrent falls General Gait Details: required RW guidance  Stairs            Wheelchair Mobility    Modified Rankin (Stroke Patients Only)       Balance Overall balance assessment: Needs assistance;History of Falls Sitting-balance support: No upper extremity  supported;Feet supported Sitting balance-Leahy Scale: Fair     Standing balance support: Bilateral upper extremity supported;During functional activity Standing balance-Leahy Scale: Poor                               Pertinent Vitals/Pain No c/o    Home Living Family/patient expects to be discharged to:: Skilled nursing facility Living Arrangements: Alone               Additional Comments: has caregiver  5 hours /day    Prior Function                 Hand Dominance        Extremity/Trunk Assessment   Upper Extremity Assessment: RUE deficits/detail;LUE deficits/detail RUE Deficits / Details: noted tremors,      LUE Deficits / Details: same as R   Lower Extremity Assessment: Generalized weakness      Cervical / Trunk Assessment: Kyphotic  Communication   Communication: No difficulties  Cognition                            General Comments      Exercises        Assessment/Plan    PT Assessment Patient needs continued PT services  PT Diagnosis Difficulty walking;Generalized weakness   PT Problem List Decreased strength;Decreased activity tolerance;Decreased balance;Decreased mobility;Decreased knowledge of precautions;Decreased safety awareness;Decreased knowledge of use of DME  PT Treatment Interventions DME instruction;Gait training;Functional mobility training;Therapeutic activities;Therapeutic exercise;Patient/family education   PT Goals (Current goals can be found in the Care Plan section) Acute Rehab PT Goals Patient  Stated Goal: I want to go to get therapy and walk again PT Goal Formulation: With patient Time For Goal Achievement: 09/11/13 Potential to Achieve Goals: Good    Frequency Min 3X/week   Barriers to discharge        Co-evaluation               End of Session Equipment Utilized During Treatment: Gait belt Activity Tolerance: Patient tolerated treatment well Patient left: in chair;with call  bell/phone within reach;with chair alarm set Nurse Communication: Mobility status         Time: 1040-1107 PT Time Calculation (min): 27 min   Charges:   PT Evaluation $Initial PT Evaluation Tier I: 1 Procedure PT Treatments $Gait Training: 23-37 mins   PT G Codes:          Claretha Cooper 08/28/2013, 11:20 AM Tresa Endo PT [phone]

## 2013-08-28 NOTE — Care Management Note (Addendum)
    Page 1 of 1   08/30/2013     4:24:37 PM CARE MANAGEMENT NOTE 08/30/2013  Patient:  [name], [name]   Account Number:  [id number]  Date Initiated:  08/28/2013  Documentation initiated by:  Dessa Phi  Subjective/Objective Assessment:   80 Y/O F ADMITTED W/HYPONATREMIA.     Action/Plan:   FROM  HOME ALONE.   Anticipated DC Date:  08/30/2013   Anticipated DC Plan:  [place]  CM consult      Choice offered to / List presented to:             Status of service:  Completed, signed off Medicare Important Message given?  NA - LOS <3 / Initial given by admissions (If response is "NO", the following Medicare IM given date fields will be blank) Date Medicare IM given:  08/30/2013 Medicare IM given by:  [facility] Date Additional Medicare IM given:   Additional Medicare IM given by:    Discharge Disposition:  [place]  Per UR Regulation:  Reviewed for med. necessity/level of care/duration of stay  If discussed at [place] of Stay Meetings, dates discussed:    Comments:  08/30/13 [facility] RN,BSN NCM [phone] D/C SNF.  08/28/13 Cherity Blickenstaff RN,BSN NCM [phone] PT-SNF.SW ALREADY FOLLOWING FOR SNF.

## 2013-08-28 NOTE — Progress Notes (Signed)
CRITICAL VALUE ALERT  Critical value received:  NA 119  Date of notification:  08/28/2013  Time of notification:  2107  Critical value read back:Yes.    Nurse who received alert:  Virgina Norfolk  MD notified (1st page):  Schorr  Time of first page:  2108  MD notified (2nd page):  Time of second page:  Responding MD:  Schorr  Time MD responded:  2014

## 2013-08-28 NOTE — Progress Notes (Signed)
Clinical Social Work Department BRIEF PSYCHOSOCIAL ASSESSMENT 08/28/2013  Patient:  [name], [name]     Account Number:  [id number]     Admit date:  08/27/2013  Clinical Social Worker:  Renold Genta  Date/Time:  08/28/2013 02:14 PM  Referred by:  Physician  Date Referred:  08/28/2013 Referred for  SNF Placement   Other Referral:   Interview type:  Patient Other interview type:   and daughter, [name] at bedside    PSYCHOSOCIAL DATA Living Status:  ALONE Admitted from facility:   Level of care:   Primary support name:  [name] (daughter) c#: [phone] Primary support relationship to patient:  CHILD, ADULT Degree of support available:   good    CURRENT CONCERNS Current Concerns  Post-Acute Placement   Other Concerns:    SOCIAL WORK ASSESSMENT / PLAN CSW reviewed PT evaluation recommending SNF for patient at discharge.   Assessment/plan status:  Information/Referral to [employer] Other assessment/ plan:   Information/referral to community resources:   CSW completed FL2 and faxed information out to [facility] - provided bed offers.    PATIENT'S/FAMILY'S RESPONSE TO PLAN OF CARE: Patient accepted bed at [facility] - stating that she's been there several times in the past and "it will be like homecoming." Patient is genuinely looking forward to rehab at [facility].       Raynaldo Opitz, [facility] Clinical Social Worker cell #: [phone]

## 2013-08-28 NOTE — H&P (Addendum)
[facility] History and Physical  [name] HGD:[id number] DOB: [birth year] DOA: 08/27/2013  Referring physician: EDP PCP: Jani Gravel, MD   Chief Complaint: fall and altered mental status.   HPI: [name] is a 79 y.o. female with prior h/o diabetes mellitus, hypertension, PVd, CAD, was recently seen by her PCP for UTI and was put on ciprofloxacin, was brought in today for worsening mental status and a fall on Friday without any LOC. No family at bedside and most of the history was obtained from the EDP . She reports nausea, vomiting started today, . She repeated says she is hungry. No other history available. after arrival to ED, she vomited once and it was positive for blood, she denies any abdominal pain. Her labs reveal mild leukocytosis. Her UA showed trace leukocytes. Her EKG shows sinus rhythm. Her imaging of the head and cervical spine does not show any acute process except for old compression fracture of the thoracic vertebrae of t2 and t3. She is referred to medical service for further evaluation. Gastroenterology was consulted by EDP.    Review of Systems:  Could not be obtained.   Past Medical History  Diagnosis Date  . Myocardial infarction   . Coronary artery disease   . Hypertension   . Dysrhythmia   . Peripheral vascular disease   . Hypothyroidism   . Diabetes mellitus   . Presbyesophagus 04/23/2011  . Urinary retention with incomplete bladder emptying 04/23/2011  . UTI (lower urinary tract infection) 04/21/2011  . Diastolic CHF, chronic     Grade I diastolic dysfunction on Echo 04/26/11  . Anaphylaxis 04/25/2011    Thought to be from Rocephin   Past Surgical History  Procedure Laterality Date  . Cholecystectomy    . Colonoscopy  01/15/2011    Procedure: COLONOSCOPY;  Surgeon: Beryle Beams, MD;  Location: [facility];  Service: Endoscopy;  Laterality: N/A;  . Amputation Right 04/14/2012    Procedure: AMPUTATION RAY;  Surgeon: Newt Minion, MD;   Location: [facility] ORS;  Service: Orthopedics;  Laterality: Right;  ankle block   Social History:  reports that she quit smoking about 32 years ago. Her smoking use included Cigarettes. She smoked 0.00 packs per day. She has never used smokeless tobacco. She reports that she drinks alcohol. She reports that she does not use illicit drugs.  Allergies  Allergen Reactions  . Ephedrine     Unknown.    . Morphine Other (See Comments)    Difficulty waking up & talking after surgery  . Rocephin [Ceftriaxone Sodium In Dextrose]     Developed acute resp distress, wheezing after 2nd dose.    Family History  Problem Relation Age of Onset  . Cancer Sister   . Diabetes Mother   . Heart disease Father      Prior to Admission medications   Medication Sig Start Date End Date Taking? Authorizing Provider  carvedilol (COREG) 6.25 MG tablet Take 6.25 mg by mouth 2 (two) times daily with a meal.     Yes Historical Provider, MD  diltiazem (CARDIZEM CD) 180 MG 24 hr capsule Take 180 mg by mouth at bedtime.    Yes Historical Provider, MD  furosemide (LASIX) 20 MG tablet Take 1 tablet (20 mg total) by mouth 2 (two) times daily. 10/13/12  Yes Troy Sine, MD  gabapentin (NEURONTIN) 300 MG capsule Take 300 mg by mouth 2 (two) times daily. 02/17/12  Yes Ivan Anchors Love, PA-C  insulin glargine (LANTUS)  100 UNIT/ML injection Inject 28 Units into the skin every morning. 02/17/12  Yes Ivan Anchors Love, PA-C  insulin lispro (HUMALOG) 100 UNIT/ML injection Inject 4 Units into the skin every morning.    Yes Historical Provider, MD  isosorbide mononitrate (IMDUR) 30 MG 24 hr tablet Take 30 mg by mouth daily.     Yes Historical Provider, MD  levothyroxine (SYNTHROID, LEVOTHROID) 175 MCG tablet Take 175 mcg by mouth daily before breakfast.   Yes Historical Provider, MD  Opium Tincture, Paregoric, 2 MG/5ML TINC Take 5 mLs by mouth daily as needed.    Yes Historical Provider, MD  Tamsulosin HCl (FLOMAX) 0.4 MG CAPS Take 0.4 mg by  mouth every morning.    Yes Historical Provider, MD   Physical Exam: Filed Vitals:   08/27/13 2130 08/28/13 0020 08/28/13 0023 08/28/13 0046  BP: 167/46 180/57  188/53  Pulse:  68 67 71  Temp:   97.9 F (36.6 C) 97.3 F (36.3 C)  TempSrc:   Oral Oral  Resp: 12 15  16   Height:    5\' 8"  (1.727 m)  Weight:    73.8 kg (162 lb 11.2 oz)  SpO2:  99% 98% 100%    Wt Readings from Last 3 Encounters:  08/28/13 73.8 kg (162 lb 11.2 oz)  11/01/12 72.893 kg (160 lb 11.2 oz)  05/29/12 78.744 kg (173 lb 9.6 oz)    General:  Appears calm and comfortable Eyes: PERRL, normal lids, irises & conjunctiva Neck: no LAD, masses or thyromegaly Cardiovascular: RRR, no m/r/g. Trace pedal edema.  Respiratory: CTA bilaterally, no w/r/r. Normal respiratory effort. Abdomen: soft, ntnd Skin: no rash or induration seen on limited exam Musculoskeletal: grossly normal tone BUE/BLE Neurologic: alert confused. Able to move her extremities and speech normal. No facial droop.           Labs on Admission:  Basic Metabolic Panel:  Recent Labs Lab 08/27/13 2127  NA 122*  K 4.8  CL 82*  CO2 26  GLUCOSE 167*  BUN 21  CREATININE 1.38*  CALCIUM 9.6   Liver Function Tests:  Recent Labs Lab 08/27/13 2127  AST 17  ALT 13  ALKPHOS 62  BILITOT 0.3  PROT 8.2  ALBUMIN 4.0    Recent Labs Lab 08/27/13 2127  LIPASE 11   No results found for this basename: AMMONIA,  in the last 168 hours CBC:  Recent Labs Lab 08/27/13 2127  WBC 11.9*  NEUTROABS 9.4*  HGB 12.7  HCT 37.0  MCV 82.2  PLT 348   Cardiac Enzymes:  Recent Labs Lab 08/27/13 2127  TROPONINI <0.30    BNP (last 3 results)  Recent Labs  08/27/13 2127  PROBNP 231.0   CBG:  Recent Labs Lab 08/27/13 2210  GLUCAP 210*    Radiological Exams on Admission: Ct Head Wo Contrast  08/27/2013   CLINICAL DATA:  Emesis.  EXAM: CT HEAD WITHOUT CONTRAST  CT CERVICAL SPINE WITHOUT CONTRAST  TECHNIQUE: Multidetector CT imaging of the  head and cervical spine was performed following the standard protocol without intravenous contrast. Multiplanar CT image reconstructions of the cervical spine were also generated.  COMPARISON:  02/03/2012 head CT cervical spine radiography 05/20/2009  FINDINGS: CT HEAD FINDINGS  Skull and Sinuses:No acute fracture destructive process. Chronic mastoiditis in the left mastoid tip.  Orbits: No acute findings.  Bilateral cataract resection.  Brain: No evidence of acute abnormality, such as acute infarction, hemorrhage, hydrocephalus, or mass lesion/mass effect. Generalized brain atrophy and chronic  small vessel disease, expected for age.  CT CERVICAL SPINE FINDINGS  Compression fractures of T2 and T3 with less than 50% height loss. Although new from 02/21/2008, these appear remote based on lack of visible fracture line or perispinal edema. Mild retropulsion may be present at T3, although there is partial imaging.  No acute cervical spine fracture. Chronic C4-5 anterolisthesis associated with advanced facet osteoarthritis and degenerative disc disease. C5-6 ankylosis of the bodies and posterior elements. Diffuse facet osteoarthritis and degenerative disc change. No gross cervical canal hematoma or prevertebral edema. No advanced osseous canal stenosis.  IMPRESSION: 1. No acute intracranial abnormality. 2. Age related brain atrophy and chronic small vessel disease. 3. No acute cervical spine findings. 4. T2 and T3 compression fractures, favored remote. Correlate with thoracic spine exam.   Electronically Signed   By: Jorje Guild M.D.   On: 08/27/2013 22:52   Ct Cervical Spine Wo Contrast  08/27/2013   CLINICAL DATA:  Emesis.  EXAM: CT HEAD WITHOUT CONTRAST  CT CERVICAL SPINE WITHOUT CONTRAST  TECHNIQUE: Multidetector CT imaging of the head and cervical spine was performed following the standard protocol without intravenous contrast. Multiplanar CT image reconstructions of the cervical spine were also generated.   COMPARISON:  02/03/2012 head CT cervical spine radiography 05/20/2009  FINDINGS: CT HEAD FINDINGS  Skull and Sinuses:No acute fracture destructive process. Chronic mastoiditis in the left mastoid tip.  Orbits: No acute findings.  Bilateral cataract resection.  Brain: No evidence of acute abnormality, such as acute infarction, hemorrhage, hydrocephalus, or mass lesion/mass effect. Generalized brain atrophy and chronic small vessel disease, expected for age.  CT CERVICAL SPINE FINDINGS  Compression fractures of T2 and T3 with less than 50% height loss. Although new from 02/21/2008, these appear remote based on lack of visible fracture line or perispinal edema. Mild retropulsion may be present at T3, although there is partial imaging.  No acute cervical spine fracture. Chronic C4-5 anterolisthesis associated with advanced facet osteoarthritis and degenerative disc disease. C5-6 ankylosis of the bodies and posterior elements. Diffuse facet osteoarthritis and degenerative disc change. No gross cervical canal hematoma or prevertebral edema. No advanced osseous canal stenosis.  IMPRESSION: 1. No acute intracranial abnormality. 2. Age related brain atrophy and chronic small vessel disease. 3. No acute cervical spine findings. 4. T2 and T3 compression fractures, favored remote. Correlate with thoracic spine exam.   Electronically Signed   By: Jorje Guild M.D.   On: 08/27/2013 22:52   Dg Chest Port 1 View  08/27/2013   CLINICAL DATA:  Vomiting and altered mental status  EXAM: PORTABLE CHEST - 1 VIEW  COMPARISON:  02/04/2012  FINDINGS: Negative for heart failure. Negative for pneumonia or effusion. Increased lung markings appear chronic bilaterally.  IMPRESSION: No active disease.   Electronically Signed   By: Franchot Gallo M.D.   On: 08/27/2013 22:22    EKG: sinus rhythm.  Assessment/Plan Active Problems:   DIABETES MELLITUS, TYPE II, UNCONTROLLED   HYPERTENSION, BENIGN ESSENTIAL   Dehydration    Hypothyroidism   Hyponatremia   Fall  1. Acute encephalopathy:  Probably metabolic in nature from hyponatremia.  - gentle hydration and get TSH, cortisol and serum osmo and urine sodium.  - repeat sodium level in am.   2. Hyponatremia: Could be secondary to dehydration from persistent vomiting.  - zofran, IV hydration and repeat sodium level in am.    3. Nausea, vomiting, no abdominal pain.  - ? Gastroenteritis vs from ciprofloxacin.  - iV anti  emetics.  - gastric emesis is positive for blood.  - NPO with sips with water and meds.  - IV PPI, her H&h appears to be stable, but her baseline hemoglobin is around 10 in 2014. Suspect hemoglobin will drop after she gets hydration. Continue to monitor hemoglobin.  - GI consulted by EDP.    4. CKD stage 2 to 3.: Appears to be at baseline. BUN normal.    5. Mild leukocytosis: Monitor.   6. Diabetes Mellitus: - not well controlled. Resume home lantus.    7. Accelerated hypertension: - resume home meds.   8. Fall : probably from orthostatics. Get orthostatic vital signs in am.  PT eval.      Code Status: PRESUMED full code, no family at bedside DVT Prophylaxis: scd's Family Communication: none at bedside, daughter to be called in am.  Disposition Plan: admit to telemetry  Time spent: 65 min  [facility] Pager [phone]  **Disclaimer: This note may have been dictated with voice recognition software. Similar sounding words can inadvertently be transcribed and this note may contain transcription errors which may not have been corrected upon publication of note.**

## 2013-08-28 NOTE — ED Provider Notes (Signed)
Medical screening examination/treatment/procedure(s) were conducted as a shared visit with non-physician practitioner(s) and myself.  I personally evaluated the patient during the encounter  Please see my separate respective documentation pertaining to this patient encounter   Johnna Acosta, MD 08/28/13 1116

## 2013-08-28 NOTE — Progress Notes (Signed)
Writer reported to MD, present on floor, of critical lab of Na 121.  Be aware that MD is ordering BMP for night shift. If Na below 121, call on-call MD for instruction.

## 2013-08-28 NOTE — Progress Notes (Signed)
[facility] PROGRESS NOTE  AOKI WEDEMEYER FIE:[id number] DOB: Mar 10, 1919 DOA: 08/27/2013 PCP: Jani Gravel, MD  Brief summary  The patient is a 80 year old female with history of diabetes mellitus, hypertension, peripheral vascular disease, coronary artery disease, seen on 7/31 by her primary care doctor for urinary tract infection and placed on ciprofloxacin.  Labs done at that time demonstrated a sodium of 119. She was advised to not take her Lasix and followup within a few days for repeat blood work.  She was brought to the emergency department on 8/3 for worsening mental status and a fall on Friday without loss of consciousness. She had some nausea vomiting and emesis in the emergency department which was occult positive. Head CT in the emergency department was negative for acute process.  Assessment/Plan  Acute metabolic encephalopathy, likely secondary to hyponatremia, recent UTI, and vomiting -  Frequent reorientation -  Correct hyponatremia -  Resolving already by this afternoon  Hyponatremia, started last week, prior to her nausea and vomiting but in the setting of urinary tract infection.  Her sodium at the time of admission is actually improved from last week.  I agree that this is likely secondary to dehydration and normal ADH secretion from nausea and vomiting. Her urine is maximally dilute and her FENa is less than 1%.   -  Sodium is gradually trending up with IV fluids -  Continue IV fluids -  Continue to hold lasix -  Repeat sodium this afternoon to avoid overcorrection -  Continue neuro checks -  TSH wnl -  Cortisol level wnl -  No hx of cirrhosis -  Does have hx of diastolic dysfunction, but does not appear hypervolemic  Nausea, vomiting, no abdominal pain, may be secondary to gastritis, hyponatremia, pill esophagitis.  Hemoglobin is stable, emesis has resolved, asking to eat -  Advance diet to soft/diabetic -  Continue antiemetics -  Continue twice a day PPI -   Consider gastroenterology consult if hematemesis recurs or evidence of GIB/decreasing hemoglobin -  F/u with GI as outpatient.  Seen by Dr. Benson Norway  CKD stage 2 to 3, creatinine at baseline of 1.2-1.4 -  Minimize nephrotoxins and renally dose medications  Leukocytosis, UA without significant signs of infection and CXR unremarkable.  Afebrile, no nuchal rigidity. -  Trend -  F/u urine culture  Diabetes Mellitus, hyperglycemic -  Check A1c -  Continue lantus -  Continue SSI  Accelerated hypertension, improving after resuming home medications.  May also be elevated due to stress from vomiting and hospitalization -  Continue carvedilol, diltiazem, imdur and titrate doses as needed -  Tele:  NSR  Hypothyroidism -  TSH 0.769 -  continue synthroid  Fall, possibly due to orthostatic hypotension.  Orthostatics negative, however, they were performed after IVF boluses. -  PT recommending SNF  Diet:  Diabetic, soft Access:  PIV IVF:  yes Proph:  SCDs  Code Status: full code Family Communication: patient and her daughter [name] [phone] Disposition Plan: pending improvement in sodium, to rehab   Consultants:  None  Procedures:  CXR  CT head and cervical spine  Antibiotics:  Ciprofloxacin prior to admission, none since    HPI/Subjective:  Denies fevers, chills, chest or abdominal pain.  Nausea has resolved and she is hungry.    Objective: Filed Vitals:   08/28/13 0046 08/28/13 0212 08/28/13 0518 08/28/13 1105  BP: 188/53 163/53 152/43   Pulse: 71 65 78   Temp: 97.3 F (36.3 C)  97.6 F (  36.4 C)   TempSrc: Oral  Oral   Resp: 16  16   Height: 5\' 8"  (1.727 m)     Weight: 73.8 kg (162 lb 11.2 oz)     SpO2: 100%  98% 98%    Intake/Output Summary (Last 24 hours) at 08/28/13 1459 Last data filed at 08/28/13 1455  Gross per 24 hour  Intake   1120 ml  Output    951 ml  Net    169 ml   Filed Weights   08/28/13 0046  Weight: 73.8 kg (162 lb 11.2 oz)     Exam:   General:  WF, No acute distress  HEENT:  NCAT, MMM  Cardiovascular:  RRR, nl S1, S2 no mrg, 2+ pulses, warm extremities  Respiratory:  CTAB, no increased WOB  Abdomen:   NABS, soft, NT/ND  MSK:   Normal tone and bulk, no LEE  Neuro:  Grossly intact  Psych:  A&Ox4, and appropriate for duration of my conversation, but per daughter, she has been intermittently confused this morning  Data Reviewed: Basic Metabolic Panel:  Recent Labs Lab 08/27/13 2127 08/28/13 0410  NA 122* 123*  K 4.8 4.8  CL 82* 84*  CO2 26 26  GLUCOSE 167* 230*  BUN 21 19  CREATININE 1.38* 1.24*  CALCIUM 9.6 9.0   Liver Function Tests:  Recent Labs Lab 08/27/13 2127  AST 17  ALT 13  ALKPHOS 62  BILITOT 0.3  PROT 8.2  ALBUMIN 4.0    Recent Labs Lab 08/27/13 2127  LIPASE 11   No results found for this basename: AMMONIA,  in the last 168 hours CBC:  Recent Labs Lab 08/27/13 2127 08/28/13 0410  WBC 11.9* 15.2*  NEUTROABS 9.4*  --   HGB 12.7 12.2  HCT 37.0 36.5  MCV 82.2 82.0  PLT 348 332   Cardiac Enzymes:  Recent Labs Lab 08/27/13 2127  TROPONINI <0.30   BNP (last 3 results)  Recent Labs  08/27/13 2127  PROBNP 231.0   CBG:  Recent Labs Lab 08/27/13 2210 08/28/13 0200 08/28/13 0714  GLUCAP 210* 226* 208*    No results found for this or any previous visit (from the past 240 hour(s)).   Studies: Ct Head Wo Contrast  08/27/2013   CLINICAL DATA:  Emesis.  EXAM: CT HEAD WITHOUT CONTRAST  CT CERVICAL SPINE WITHOUT CONTRAST  TECHNIQUE: Multidetector CT imaging of the head and cervical spine was performed following the standard protocol without intravenous contrast. Multiplanar CT image reconstructions of the cervical spine were also generated.  COMPARISON:  02/03/2012 head CT cervical spine radiography 05/20/2009  FINDINGS: CT HEAD FINDINGS  Skull and Sinuses:No acute fracture destructive process. Chronic mastoiditis in the left mastoid tip.  Orbits: No  acute findings.  Bilateral cataract resection.  Brain: No evidence of acute abnormality, such as acute infarction, hemorrhage, hydrocephalus, or mass lesion/mass effect. Generalized brain atrophy and chronic small vessel disease, expected for age.  CT CERVICAL SPINE FINDINGS  Compression fractures of T2 and T3 with less than 50% height loss. Although new from 02/21/2008, these appear remote based on lack of visible fracture line or perispinal edema. Mild retropulsion may be present at T3, although there is partial imaging.  No acute cervical spine fracture. Chronic C4-5 anterolisthesis associated with advanced facet osteoarthritis and degenerative disc disease. C5-6 ankylosis of the bodies and posterior elements. Diffuse facet osteoarthritis and degenerative disc change. No gross cervical canal hematoma or prevertebral edema. No advanced osseous canal stenosis.  IMPRESSION:  1. No acute intracranial abnormality. 2. Age related brain atrophy and chronic small vessel disease. 3. No acute cervical spine findings. 4. T2 and T3 compression fractures, favored remote. Correlate with thoracic spine exam.   Electronically Signed   By: Jorje Guild M.D.   On: 08/27/2013 22:52   Ct Cervical Spine Wo Contrast  08/27/2013   CLINICAL DATA:  Emesis.  EXAM: CT HEAD WITHOUT CONTRAST  CT CERVICAL SPINE WITHOUT CONTRAST  TECHNIQUE: Multidetector CT imaging of the head and cervical spine was performed following the standard protocol without intravenous contrast. Multiplanar CT image reconstructions of the cervical spine were also generated.  COMPARISON:  02/03/2012 head CT cervical spine radiography 05/20/2009  FINDINGS: CT HEAD FINDINGS  Skull and Sinuses:No acute fracture destructive process. Chronic mastoiditis in the left mastoid tip.  Orbits: No acute findings.  Bilateral cataract resection.  Brain: No evidence of acute abnormality, such as acute infarction, hemorrhage, hydrocephalus, or mass lesion/mass effect. Generalized  brain atrophy and chronic small vessel disease, expected for age.  CT CERVICAL SPINE FINDINGS  Compression fractures of T2 and T3 with less than 50% height loss. Although new from 02/21/2008, these appear remote based on lack of visible fracture line or perispinal edema. Mild retropulsion may be present at T3, although there is partial imaging.  No acute cervical spine fracture. Chronic C4-5 anterolisthesis associated with advanced facet osteoarthritis and degenerative disc disease. C5-6 ankylosis of the bodies and posterior elements. Diffuse facet osteoarthritis and degenerative disc change. No gross cervical canal hematoma or prevertebral edema. No advanced osseous canal stenosis.  IMPRESSION: 1. No acute intracranial abnormality. 2. Age related brain atrophy and chronic small vessel disease. 3. No acute cervical spine findings. 4. T2 and T3 compression fractures, favored remote. Correlate with thoracic spine exam.   Electronically Signed   By: Jorje Guild M.D.   On: 08/27/2013 22:52   Dg Chest Port 1 View  08/27/2013   CLINICAL DATA:  Vomiting and altered mental status  EXAM: PORTABLE CHEST - 1 VIEW  COMPARISON:  02/04/2012  FINDINGS: Negative for heart failure. Negative for pneumonia or effusion. Increased lung markings appear chronic bilaterally.  IMPRESSION: No active disease.   Electronically Signed   By: Franchot Gallo M.D.   On: 08/27/2013 22:22    Scheduled Meds: . carvedilol  6.25 mg Oral BID WC  . diltiazem  180 mg Oral QHS  . gabapentin  300 mg Oral BID  . insulin aspart  0-9 Units Subcutaneous TID WC  . insulin glargine  28 Units Subcutaneous q morning - 10a  . isosorbide mononitrate  30 mg Oral Daily  . levothyroxine  175 mcg Oral QAC breakfast  . pantoprazole (PROTONIX) IV  40 mg Intravenous Q12H  . [START ON 08/29/2013] pneumococcal 23 valent vaccine  0.5 mL Intramuscular Tomorrow-1000  . tamsulosin  0.4 mg Oral q morning - 10a   Continuous Infusions: . sodium chloride 50 mL/hr  at 08/28/13 0100    Active Problems:   DIABETES MELLITUS, TYPE II, UNCONTROLLED   HYPERTENSION, BENIGN ESSENTIAL   Dehydration   Hypothyroidism   Hyponatremia   Fall    Time spent: 30 min    Chenee Munns, [facility]  [facility] Pager [phone]. If 7PM-7AM, please contact night-coverage at [email or web address], password [facility] 08/28/2013, 2:59 PM  LOS: 1 day

## 2013-08-28 NOTE — Progress Notes (Signed)
Clinical Social Work Department CLINICAL SOCIAL WORK PLACEMENT NOTE 08/28/2013  Patient:  ERICIA, MOXLEY  Account Number:  [id number] Admit date:  08/27/2013  Clinical Social Worker:  Renold Genta  Date/time:  08/28/2013 03:18 PM  Clinical Social Work is seeking post-discharge placement for this patient at the following level of care:   SKILLED NURSING   (*CSW will update this form in Epic as items are completed)   08/28/2013  Patient/family provided with [place] Department of Clinical Social Work's list of facilities offering this level of care within the geographic area requested by the patient (or if unable, by the patient's family).  08/28/2013  Patient/family informed of their freedom to choose among providers that offer the needed level of care, that participate in Medicare, Medicaid or managed care program needed by the patient, have an available bed and are willing to accept the patient.  08/28/2013  Patient/family informed of [facility]' ownership interest in [facility], as well as of the fact that they are under no obligation to receive care at this facility.  PASARR submitted to EDS on 08/28/2013 PASARR number received on 08/28/2013  FL2 transmitted to all facilities in geographic area requested by pt/family on  08/28/2013 FL2 transmitted to all facilities within larger geographic area on   Patient informed that his/her managed care company has contracts with or will negotiate with  certain facilities, including the following:     Patient/family informed of bed offers received:  08/28/2013 Patient chooses bed at [place] Physician recommends and patient chooses bed at    Patient to be transferred to [place] on   Patient to be transferred to facility by  Patient and family notified of transfer on  Name of family member notified:    The following physician request were entered in Epic:   Additional Comments:   Raynaldo Opitz,  [place] Social Worker cell #: [phone]

## 2013-08-29 LAB — CBC
HCT: 30.1 % — ABNORMAL LOW (ref 36.0–46.0)
HEMOGLOBIN: 10.1 g/dL — AB (ref 12.0–15.0)
MCH: 27.6 pg (ref 26.0–34.0)
MCHC: 33.6 g/dL (ref 30.0–36.0)
MCV: 82.2 fL (ref 78.0–100.0)
PLATELETS: 271 10*3/uL (ref 150–400)
RBC: 3.66 MIL/uL — ABNORMAL LOW (ref 3.87–5.11)
RDW: 13.6 % (ref 11.5–15.5)
WBC: 9.7 10*3/uL (ref 4.0–10.5)

## 2013-08-29 LAB — BASIC METABOLIC PANEL
ANION GAP: 9 (ref 5–15)
Anion gap: 9 (ref 5–15)
BUN: 20 mg/dL (ref 6–23)
BUN: 18 mg/dL (ref 6–23)
CHLORIDE: 87 meq/L — AB (ref 96–112)
CO2: 24 meq/L (ref 19–32)
CO2: 28 meq/L (ref 19–32)
CREATININE: 1.42 mg/dL — AB (ref 0.50–1.10)
Calcium: 8.1 mg/dL — ABNORMAL LOW (ref 8.4–10.5)
Calcium: 8.6 mg/dL (ref 8.4–10.5)
Chloride: 91 mEq/L — ABNORMAL LOW (ref 96–112)
Creatinine, Ser: 1.19 mg/dL — ABNORMAL HIGH (ref 0.50–1.10)
GFR calc Af Amer: 36 mL/min — ABNORMAL LOW (ref >90)
GFR calc Af Amer: 45 mL/min — ABNORMAL LOW (ref >90)
GFR calc non Af Amer: 31 mL/min — ABNORMAL LOW (ref >90)
GFR calc non Af Amer: 38 mL/min — ABNORMAL LOW (ref >90)
GLUCOSE: 99 mg/dL (ref 70–99)
Glucose, Bld: 179 mg/dL — ABNORMAL HIGH (ref 70–99)
Potassium: 5.1 mEq/L (ref 3.7–5.3)
Potassium: 4.7 mEq/L (ref 3.7–5.3)
SODIUM: 128 meq/L — AB (ref 137–147)
Sodium: 120 mEq/L — CL (ref 137–147)

## 2013-08-29 LAB — GLUCOSE, CAPILLARY
GLUCOSE-CAPILLARY: 92 mg/dL (ref 70–99)
GLUCOSE-CAPILLARY: 66 mg/dL — AB (ref 70–99)
GLUCOSE-CAPILLARY: 79 mg/dL (ref 70–99)
GLUCOSE-CAPILLARY: 154 mg/dL — AB (ref 70–99)
Glucose-Capillary: 162 mg/dL — ABNORMAL HIGH (ref 70–99)
Glucose-Capillary: 227 mg/dL — ABNORMAL HIGH (ref 70–99)

## 2013-08-29 LAB — HEMOGLOBIN A1C
Hgb A1c MFr Bld: 8.2 % — ABNORMAL HIGH (ref <5.7)
Mean Plasma Glucose: 189 mg/dL — ABNORMAL HIGH (ref <117)

## 2013-08-29 MED ORDER — SODIUM CHLORIDE 1 G PO TABS
1.0000 g | ORAL_TABLET | Freq: Three times a day (TID) | ORAL | Status: DC
  Administered 2013-08-29 – 2013-08-30 (×3): 1 g via ORAL
  Filled 2013-08-29 (×6): qty 1

## 2013-08-29 MED ORDER — PSYLLIUM 95 % PO PACK
1.0000 | PACK | Freq: Every day | ORAL | Status: DC
  Administered 2013-08-29 – 2013-08-30 (×2): 1 via ORAL
  Filled 2013-08-29 (×2): qty 1

## 2013-08-29 NOTE — Progress Notes (Signed)
Pt experiencing urinary retention with incomplete bladder emptying. From 2004-0400 pt voided 100 cc. Bladder scan performed 0400 and showed 946 cc. Pt then voided 300 cc after bladder scan. Post void bladder scan showed 711 cc. No c/o of abdominal discomfort. NP on call notified. New orders placed. Pt voided 100 cc before in and out performed. In and out catheterization performed with 700 cc return. Patient tolerated well. Post in and out bladder scan showed 36 cc. Will continue to monitor closely.

## 2013-08-29 NOTE — Progress Notes (Signed)
[facility] PROGRESS NOTE  [name] EGB:[id number] DOB: 07/18/1919 DOA: 08/27/2013 PCP: Jani Gravel, MD  Brief summary  The patient is a 80 year old female with history of diabetes mellitus, hypertension, peripheral vascular disease, coronary artery disease, seen on 7/31 by her primary care doctor for urinary tract infection and placed on ciprofloxacin.  Labs done at that time demonstrated a sodium of 119. She was advised to not take her Lasix and followup within a few days for repeat blood work.  She was brought to the emergency department on 8/3 for worsening mental status and a fall on Friday without loss of consciousness. She had some nausea vomiting and emesis in the emergency department which was occult positive. Head CT in the emergency department was negative for acute process.  Assessment/Plan  Acute metabolic encephalopathy, likely secondary to hyponatremia, recent UTI, and vomiting -  Correct hyponatremia, will add salt tablets -  Resolving   Hyponatremia, started last week, prior to her nausea and vomiting but in the setting of urinary tract infection.  Her sodium at the time of admission is actually improved from last week.  Most likely secondary to dehydration and normal ADH secretion from nausea and vomiting. Her urine is maximally dilute and her FENa is less than 1%.   -  Sodium is gradually trending up with IV fluids -  Add salt tablets -  Continue neuro checks -  TSH wnl -  Cortisol level wnl -  No hx of cirrhosis -  Does have hx of diastolic dysfunction, but does not appear hypervolemic  Nausea, vomiting, no abdominal pain, may be secondary to gastritis, hyponatremia, pill esophagitis.  Hemoglobin is stable, emesis has resolved, asking to eat -  Advance diet to soft/diabetic -  Continue antiemetics prn -  Continue twice a day PPI -  F/u with GI as outpatient.  Seen by Dr. Benson Norway  CKD stage 2 to 3, creatinine at baseline of 1.2-1.4 -  Minimize nephrotoxins and  renally dose medications  Leukocytosis, UA without significant signs of infection and CXR unremarkable.  Afebrile, no nuchal rigidity. -  Trend -  F/u urine culture  Diabetes Mellitus, hyperglycemic -  Check A1c -  Continue lantus -  Continue SSI  Accelerated hypertension, improving after resuming home medications.  May also be elevated due to stress from vomiting and hospitalization -  Continue carvedilol, diltiazem, imdur and titrate doses as needed -  Tele:  NSR  Hypothyroidism -  TSH 0.769 -  Stable, continue synthroid  Fall, possibly due to orthostatic hypotension.  Orthostatics negative, however, they were performed after IVF boluses. -  PT on board:  recommending SNF  Diet:  Diabetic, soft Access:  PIV IVF:  yes Proph:  SCDs  Code Status: full code Family Communication: patient      [name] is daughter and her phone number is [phone] Disposition Plan: pending improvement in sodium, to rehab   Consultants:  None  Procedures:  CXR  CT head and cervical spine  Antibiotics:  Ciprofloxacin prior to admission, none since    HPI/Subjective:  No new complaints. No acute issues overnight.  Objective: Filed Vitals:   08/29/13 0352 08/29/13 0932 08/29/13 1250 08/29/13 1700  BP: 135/51 129/45 132/58 161/42  Pulse: 66 66 66 96  Temp: 98 F (36.7 C) 97.6 F (36.4 C) 97.8 F (36.6 C)   TempSrc: Oral  Oral   Resp: 16 16    Height:      Weight:  SpO2: 95% 98% 98%     Intake/Output Summary (Last 24 hours) at 08/29/13 1740 Last data filed at 08/29/13 1628  Gross per 24 hour  Intake 2142.91 ml  Output   3350 ml  Net -1207.09 ml   Filed Weights   08/28/13 0046  Weight: 73.8 kg (162 lb 11.2 oz)    Exam:   General:  WF, No acute distress, alert and awake  HEENT: Atraumatic, MMM  Cardiovascular:  RRR, nl S1, S2 no mrg,   Respiratory:  CTAB, no wheezes  Abdomen:   soft, NT/ND  MSK:   Normal tone and bulk, no LEE  Neuro:  Answers  questions appropriately, no facial asymmetry  Data Reviewed: Basic Metabolic Panel:  Recent Labs Lab 08/27/13 2127 08/28/13 0410 08/28/13 1554 08/28/13 2043 08/29/13 0345  NA 122* 123* 121* 119* 120*  K 4.8 4.8 5.1 4.9 5.1  CL 82* 84* 85* 84* 87*  CO2 26 26 24 24 24   GLUCOSE 167* 230* 245* 192* 99  BUN 21 19 17 19 20   CREATININE 1.38* 1.24* 1.18* 1.40* 1.42*  CALCIUM 9.6 9.0 8.5 8.6 8.1*   Liver Function Tests:  Recent Labs Lab 08/27/13 2127  AST 17  ALT 13  ALKPHOS 62  BILITOT 0.3  PROT 8.2  ALBUMIN 4.0    Recent Labs Lab 08/27/13 2127  LIPASE 11   No results found for this basename: AMMONIA,  in the last 168 hours CBC:  Recent Labs Lab 08/27/13 2127 08/28/13 0410 08/29/13 0345  WBC 11.9* 15.2* 9.7  NEUTROABS 9.4*  --   --   HGB 12.7 12.2 10.1*  HCT 37.0 36.5 30.1*  MCV 82.2 82.0 82.2  PLT 348 332 271   Cardiac Enzymes:  Recent Labs Lab 08/27/13 2127  TROPONINI <0.30   BNP (last 3 results)  Recent Labs  08/27/13 2127  PROBNP 231.0   CBG:  Recent Labs Lab 08/28/13 2236 08/29/13 0354 08/29/13 0726 08/29/13 0750 08/29/13 1216  GLUCAP 154* 92 66* 79 162*    No results found for this or any previous visit (from the past 240 hour(s)).   Studies: Ct Head Wo Contrast  08/27/2013   CLINICAL DATA:  Emesis.  EXAM: CT HEAD WITHOUT CONTRAST  CT CERVICAL SPINE WITHOUT CONTRAST  TECHNIQUE: Multidetector CT imaging of the head and cervical spine was performed following the standard protocol without intravenous contrast. Multiplanar CT image reconstructions of the cervical spine were also generated.  COMPARISON:  02/03/2012 head CT cervical spine radiography 05/20/2009  FINDINGS: CT HEAD FINDINGS  Skull and Sinuses:No acute fracture destructive process. Chronic mastoiditis in the left mastoid tip.  Orbits: No acute findings.  Bilateral cataract resection.  Brain: No evidence of acute abnormality, such as acute infarction, hemorrhage, hydrocephalus,  or mass lesion/mass effect. Generalized brain atrophy and chronic small vessel disease, expected for age.  CT CERVICAL SPINE FINDINGS  Compression fractures of T2 and T3 with less than 50% height loss. Although new from 02/21/2008, these appear remote based on lack of visible fracture line or perispinal edema. Mild retropulsion may be present at T3, although there is partial imaging.  No acute cervical spine fracture. Chronic C4-5 anterolisthesis associated with advanced facet osteoarthritis and degenerative disc disease. C5-6 ankylosis of the bodies and posterior elements. Diffuse facet osteoarthritis and degenerative disc change. No gross cervical canal hematoma or prevertebral edema. No advanced osseous canal stenosis.  IMPRESSION: 1. No acute intracranial abnormality. 2. Age related brain atrophy and chronic small vessel disease. 3.  No acute cervical spine findings. 4. T2 and T3 compression fractures, favored remote. Correlate with thoracic spine exam.   Electronically Signed   By: Jorje Guild M.D.   On: 08/27/2013 22:52   Ct Cervical Spine Wo Contrast  08/27/2013   CLINICAL DATA:  Emesis.  EXAM: CT HEAD WITHOUT CONTRAST  CT CERVICAL SPINE WITHOUT CONTRAST  TECHNIQUE: Multidetector CT imaging of the head and cervical spine was performed following the standard protocol without intravenous contrast. Multiplanar CT image reconstructions of the cervical spine were also generated.  COMPARISON:  02/03/2012 head CT cervical spine radiography 05/20/2009  FINDINGS: CT HEAD FINDINGS  Skull and Sinuses:No acute fracture destructive process. Chronic mastoiditis in the left mastoid tip.  Orbits: No acute findings.  Bilateral cataract resection.  Brain: No evidence of acute abnormality, such as acute infarction, hemorrhage, hydrocephalus, or mass lesion/mass effect. Generalized brain atrophy and chronic small vessel disease, expected for age.  CT CERVICAL SPINE FINDINGS  Compression fractures of T2 and T3 with less than  50% height loss. Although new from 02/21/2008, these appear remote based on lack of visible fracture line or perispinal edema. Mild retropulsion may be present at T3, although there is partial imaging.  No acute cervical spine fracture. Chronic C4-5 anterolisthesis associated with advanced facet osteoarthritis and degenerative disc disease. C5-6 ankylosis of the bodies and posterior elements. Diffuse facet osteoarthritis and degenerative disc change. No gross cervical canal hematoma or prevertebral edema. No advanced osseous canal stenosis.  IMPRESSION: 1. No acute intracranial abnormality. 2. Age related brain atrophy and chronic small vessel disease. 3. No acute cervical spine findings. 4. T2 and T3 compression fractures, favored remote. Correlate with thoracic spine exam.   Electronically Signed   By: Jorje Guild M.D.   On: 08/27/2013 22:52   Dg Chest Port 1 View  08/27/2013   CLINICAL DATA:  Vomiting and altered mental status  EXAM: PORTABLE CHEST - 1 VIEW  COMPARISON:  02/04/2012  FINDINGS: Negative for heart failure. Negative for pneumonia or effusion. Increased lung markings appear chronic bilaterally.  IMPRESSION: No active disease.   Electronically Signed   By: Franchot Gallo M.D.   On: 08/27/2013 22:22    Scheduled Meds: . carvedilol  6.25 mg Oral BID WC  . diltiazem  180 mg Oral QHS  . gabapentin  300 mg Oral BID  . insulin aspart  0-9 Units Subcutaneous TID WC  . insulin glargine  28 Units Subcutaneous q morning - 10a  . isosorbide mononitrate  30 mg Oral Daily  . levothyroxine  175 mcg Oral QAC breakfast  . pantoprazole (PROTONIX) IV  40 mg Intravenous Q12H  . pneumococcal 23 valent vaccine  0.5 mL Intramuscular Tomorrow-1000  . psyllium  1 packet Oral Daily  . silver sulfADIAZINE   Topical Daily  . sodium chloride  1 g Oral TID WC  . tamsulosin  0.4 mg Oral q morning - 10a   Continuous Infusions: . sodium chloride 75 mL/hr at 08/29/13 1153    Active Problems:   DIABETES  MELLITUS, TYPE II, UNCONTROLLED   HYPERTENSION, BENIGN ESSENTIAL   Dehydration   Hypothyroidism   Hyponatremia   Fall    Time spent: 30 min    Velvet Bathe  [facility] Pager [phone]. If 7PM-7AM, please contact night-coverage at [email or web address], password [facility] 08/29/2013, 5:40 PM  LOS: 2 days

## 2013-08-29 NOTE — Progress Notes (Addendum)
Hypoglycemic Event  CBG: 66  Treatment: 15 GM carbohydrate snack  Symptoms: None  Follow-up CBG: Time 0745 CBG Result:79  Possible Reasons for Event: Unknown  Comments/MD notified:will notify on rounds    [name]  Remember to initiate Hypoglycemia Order Set & complete

## 2013-08-29 NOTE — Progress Notes (Signed)
Walked into patient room to get her up to use BSC. Noticed that patient was very drowsy, would not open her eyes, and not responding to voice. Attempted to sternal rub pt and pt was slightly aroused. BP 135/51, HR 66, resp 16, O2 95% on RA, temp 98, and blood sugar 92. After several attempts of sternal rubs pt became alert. Pt was able to answer yes or no questions. Pt was given ambien 5 mg at 2247. Pt still drowsy. Will continue to monitor pt closely.

## 2013-08-29 NOTE — Progress Notes (Signed)
CRITICAL VALUE ALERT  Critical value received:  NA 120  Date of notification:  08/29/13  Time of notification: 0420  Critical value read back:Yes.    Nurse who received alert:  Virgina Norfolk  MD notified (1st page):  Schorr  Time of first page:  [phone]  MD notified (2nd page):  Time of second page:  Responding MD:  No response  Time MD responded:  No response

## 2013-08-30 LAB — CBC
HCT: 30.7 % — ABNORMAL LOW (ref 36.0–46.0)
Hemoglobin: 10.2 g/dL — ABNORMAL LOW (ref 12.0–15.0)
MCH: 27.7 pg (ref 26.0–34.0)
MCHC: 33.2 g/dL (ref 30.0–36.0)
MCV: 83.4 fL (ref 78.0–100.0)
PLATELETS: 263 10*3/uL (ref 150–400)
RBC: 3.68 MIL/uL — AB (ref 3.87–5.11)
RDW: 13.7 % (ref 11.5–15.5)
WBC: 8.1 10*3/uL (ref 4.0–10.5)

## 2013-08-30 LAB — BASIC METABOLIC PANEL
Anion gap: 8 (ref 5–15)
BUN: 17 mg/dL (ref 6–23)
CALCIUM: 8.3 mg/dL — AB (ref 8.4–10.5)
CO2: 26 mEq/L (ref 19–32)
Chloride: 99 mEq/L (ref 96–112)
Creatinine, Ser: 1.24 mg/dL — ABNORMAL HIGH (ref 0.50–1.10)
GFR, EST AFRICAN AMERICAN: 42 mL/min — AB (ref >90)
GFR, EST NON AFRICAN AMERICAN: 37 mL/min — AB (ref >90)
Glucose, Bld: 77 mg/dL (ref 70–99)
POTASSIUM: 4.8 meq/L (ref 3.7–5.3)
Sodium: 133 mEq/L — ABNORMAL LOW (ref 137–147)

## 2013-08-30 LAB — GLUCOSE, CAPILLARY
GLUCOSE-CAPILLARY: 79 mg/dL (ref 70–99)
GLUCOSE-CAPILLARY: 268 mg/dL — AB (ref 70–99)

## 2013-08-30 NOTE — Progress Notes (Signed)
Writer called [facility] and gave report to nurse receiving pt this afternoon. Pt awaiting transfer.

## 2013-08-30 NOTE — Discharge Summary (Signed)
Physician Discharge Summary  [name] OBS:[id number] DOB: [birth year] DOA: 08/27/2013  PCP: Jani Gravel, MD  Admit date: 08/27/2013 Discharge date: 08/30/2013  Time spent: > 35 minutes  Recommendations for Outpatient Follow-up:  1. Please see d/c instructions below 2. Pt to continue PT at SNF  Discharge Diagnoses:  Active Problems:   DIABETES MELLITUS, TYPE II, UNCONTROLLED   HYPERTENSION, BENIGN ESSENTIAL   Dehydration   Hypothyroidism   Hyponatremia   Fall   Discharge Condition: stable  Diet recommendation: diabetic diet.  Filed Weights   08/28/13 0046  Weight: 73.8 kg (162 lb 11.2 oz)    History of present illness:  80 y/o with PMH significant for diabetes, hypertension, hypothyroidism. Who presented with dehydration and weakness. Found to have low sodium levels.  Hospital Course:  Active Problems:   DIABETES MELLITUS, TYPE II, UNCONTROLLED - Diabetic diet at facility - Patient to continue home insulin regimen once    HYPERTENSION, BENIGN ESSENTIAL - Will discharge on carvedilol, diltiazem and isosorbide mononitrate at home doses    Dehydration - Most likely contributing to hyponatremia - Resolved after improved oral intake as well as IV fluid administration initially.    Hypothyroidism - Stable patient to continue home regimen    Hyponatremia - Principal problem which most likely was secondary to poor oral solute intake. Problem resolved with normal saline administration as well as salt tablet administration.    Fall - We'll be discharging patient with physical therapy  Procedures:  None  Consultations:  None  Discharge Exam: Filed Vitals:   08/30/13 0458  BP: 116/58  Pulse: 67  Temp: 98.4 F (36.9 C)  Resp: 18    General: Patient in no acute distress, alert and awake  Cardiovascular: Regular rate and rhythm, no murmurs rubs  Respiratory: Clear to auscultation bilaterally no wheezes  Discharge Instructions You were cared for by a  hospitalist during your hospital stay. If you have any questions about your discharge medications or the care you received while you were in the hospital after you are discharged, you can call the unit and asked to speak with the hospitalist on call if the hospitalist that took care of you is not available. Once you are discharged, your primary care physician will handle any further medical issues. Please note that NO REFILLS for any discharge medications will be authorized once you are discharged, as it is imperative that you return to your primary care physician (or establish a relationship with a primary care physician if you do not have one) for your aftercare needs so that they can reassess your need for medications and monitor your lab values.  Discharge Instructions   Call MD for:  extreme fatigue    Complete by:  As directed      Call MD for:  temperature >100.4    Complete by:  As directed      Diet - low sodium heart healthy    Complete by:  As directed      Discharge instructions    Complete by:  As directed   Please reassess sodium level within the next week     Increase activity slowly    Complete by:  As directed             Medication List    STOP taking these medications       insulin lispro 100 UNIT/ML injection  Commonly known as:  HUMALOG      TAKE these medications  carvedilol 6.25 MG tablet  Commonly known as:  COREG  Take 6.25 mg by mouth 2 (two) times daily with a meal.     diltiazem 180 MG 24 hr capsule  Commonly known as:  CARDIZEM CD  Take 180 mg by mouth at bedtime.     furosemide 20 MG tablet  Commonly known as:  LASIX  Take 1 tablet (20 mg total) by mouth 2 (two) times daily.     gabapentin 300 MG capsule  Commonly known as:  NEURONTIN  Take 300 mg by mouth 2 (two) times daily.     insulin glargine 100 UNIT/ML injection  Commonly known as:  LANTUS  Inject 28 Units into the skin every morning.     isosorbide mononitrate 30 MG 24 hr  tablet  Commonly known as:  IMDUR  Take 30 mg by mouth daily.     levothyroxine 175 MCG tablet  Commonly known as:  SYNTHROID, LEVOTHROID  Take 175 mcg by mouth daily before breakfast.     Opium Tincture (Paregoric) 2 MG/5ML Tinc  Take 5 mLs by mouth daily as needed.     tamsulosin 0.4 MG Caps capsule  Commonly known as:  FLOMAX  Take 0.4 mg by mouth every morning.       Allergies  Allergen Reactions  . Ephedrine     Unknown.    . Morphine Other (See Comments)    Difficulty waking up & talking after surgery  . Rocephin [Ceftriaxone Sodium In Dextrose]     Developed acute resp distress, wheezing after 2nd dose.       Follow-up Information   Follow up with Jani Gravel, MD.   Specialty:  Internal Medicine   Contact information:   [address] [place] [address] [phone]        The results of significant diagnostics from this hospitalization (including imaging, microbiology, ancillary and laboratory) are listed below for reference.    Significant Diagnostic Studies: Ct Head Wo Contrast  08/27/2013   CLINICAL DATA:  Emesis.  EXAM: CT HEAD WITHOUT CONTRAST  CT CERVICAL SPINE WITHOUT CONTRAST  TECHNIQUE: Multidetector CT imaging of the head and cervical spine was performed following the standard protocol without intravenous contrast. Multiplanar CT image reconstructions of the cervical spine were also generated.  COMPARISON:  02/03/2012 head CT cervical spine radiography 05/20/2009  FINDINGS: CT HEAD FINDINGS  Skull and Sinuses:No acute fracture destructive process. Chronic mastoiditis in the left mastoid tip.  Orbits: No acute findings.  Bilateral cataract resection.  Brain: No evidence of acute abnormality, such as acute infarction, hemorrhage, hydrocephalus, or mass lesion/mass effect. Generalized brain atrophy and chronic small vessel disease, expected for age.  CT CERVICAL SPINE FINDINGS  Compression fractures of T2 and T3 with less than 50% height  loss. Although new from 02/21/2008, these appear remote based on lack of visible fracture line or perispinal edema. Mild retropulsion may be present at T3, although there is partial imaging.  No acute cervical spine fracture. Chronic C4-5 anterolisthesis associated with advanced facet osteoarthritis and degenerative disc disease. C5-6 ankylosis of the bodies and posterior elements. Diffuse facet osteoarthritis and degenerative disc change. No gross cervical canal hematoma or prevertebral edema. No advanced osseous canal stenosis.  IMPRESSION: 1. No acute intracranial abnormality. 2. Age related brain atrophy and chronic small vessel disease. 3. No acute cervical spine findings. 4. T2 and T3 compression fractures, favored remote. Correlate with thoracic spine exam.   Electronically Signed   By: Jorje Guild  M.D.   On: 08/27/2013 22:52   Ct Cervical Spine Wo Contrast  08/27/2013   CLINICAL DATA:  Emesis.  EXAM: CT HEAD WITHOUT CONTRAST  CT CERVICAL SPINE WITHOUT CONTRAST  TECHNIQUE: Multidetector CT imaging of the head and cervical spine was performed following the standard protocol without intravenous contrast. Multiplanar CT image reconstructions of the cervical spine were also generated.  COMPARISON:  02/03/2012 head CT cervical spine radiography 05/20/2009  FINDINGS: CT HEAD FINDINGS  Skull and Sinuses:No acute fracture destructive process. Chronic mastoiditis in the left mastoid tip.  Orbits: No acute findings.  Bilateral cataract resection.  Brain: No evidence of acute abnormality, such as acute infarction, hemorrhage, hydrocephalus, or mass lesion/mass effect. Generalized brain atrophy and chronic small vessel disease, expected for age.  CT CERVICAL SPINE FINDINGS  Compression fractures of T2 and T3 with less than 50% height loss. Although new from 02/21/2008, these appear remote based on lack of visible fracture line or perispinal edema. Mild retropulsion may be present at T3, although there is partial  imaging.  No acute cervical spine fracture. Chronic C4-5 anterolisthesis associated with advanced facet osteoarthritis and degenerative disc disease. C5-6 ankylosis of the bodies and posterior elements. Diffuse facet osteoarthritis and degenerative disc change. No gross cervical canal hematoma or prevertebral edema. No advanced osseous canal stenosis.  IMPRESSION: 1. No acute intracranial abnormality. 2. Age related brain atrophy and chronic small vessel disease. 3. No acute cervical spine findings. 4. T2 and T3 compression fractures, favored remote. Correlate with thoracic spine exam.   Electronically Signed   By: Jorje Guild M.D.   On: 08/27/2013 22:52   Dg Chest Port 1 View  08/27/2013   CLINICAL DATA:  Vomiting and altered mental status  EXAM: PORTABLE CHEST - 1 VIEW  COMPARISON:  02/04/2012  FINDINGS: Negative for heart failure. Negative for pneumonia or effusion. Increased lung markings appear chronic bilaterally.  IMPRESSION: No active disease.   Electronically Signed   By: Franchot Gallo M.D.   On: 08/27/2013 22:22    Microbiology: No results found for this or any previous visit (from the past 240 hour(s)).   Labs: Basic Metabolic Panel:  Recent Labs Lab 08/28/13 1554 08/28/13 2043 08/29/13 0345 08/29/13 1806 08/30/13 0425  NA 121* 119* 120* 128* 133*  K 5.1 4.9 5.1 4.7 4.8  CL 85* 84* 87* 91* 99  CO2 24 24 24 28 26   GLUCOSE 245* 192* 99 179* 77  BUN 17 19 20 18 17   CREATININE 1.18* 1.40* 1.42* 1.19* 1.24*  CALCIUM 8.5 8.6 8.1* 8.6 8.3*   Liver Function Tests:  Recent Labs Lab 08/27/13 2127  AST 17  ALT 13  ALKPHOS 62  BILITOT 0.3  PROT 8.2  ALBUMIN 4.0    Recent Labs Lab 08/27/13 2127  LIPASE 11   No results found for this basename: AMMONIA,  in the last 168 hours CBC:  Recent Labs Lab 08/27/13 2127 08/28/13 0410 08/29/13 0345 08/30/13 0425  WBC 11.9* 15.2* 9.7 8.1  NEUTROABS 9.4*  --   --   --   HGB 12.7 12.2 10.1* 10.2*  HCT 37.0 36.5 30.1*  30.7*  MCV 82.2 82.0 82.2 83.4  PLT 348 332 271 263   Cardiac Enzymes:  Recent Labs Lab 08/27/13 2127  TROPONINI <0.30   BNP: BNP (last 3 results)  Recent Labs  08/27/13 2127  PROBNP 231.0   CBG:  Recent Labs Lab 08/29/13 1216 08/29/13 1732 08/29/13 2152 08/30/13 0738 08/30/13 1159  GLUCAP 162* 227*  154* 79 268*       Signed:  Velvet Bathe  [facility] 08/30/2013, 12:47 PM

## 2013-08-30 NOTE — Progress Notes (Signed)
Came to visit patient at bedside. She is active with [facility] services. Patient endorses she is going to rehab upon discharge. Appreciative of visit. Made inpatient RNCM aware of bedside visit.  Marthenia Rolling, MSN-RN,BSN- [facility] [phone]

## 2013-08-30 NOTE — Progress Notes (Signed)
Patient is set to discharge to [facility] today. Patient & daughter, [name] at bedside aware. Discharge packet in [place], Abigail Butts aware. [facility] called for transport.   Clinical Social Work Department CLINICAL SOCIAL WORK PLACEMENT NOTE 08/30/2013  Patient:  [name], [name]  Account Number:  [id number] Admit date:  08/27/2013  Clinical Social Worker:  Renold Genta  Date/time:  08/28/2013 03:18 PM  Clinical Social Work is seeking post-discharge placement for this patient at the following level of care:   SKILLED NURSING   (*CSW will update this form in Epic as items are completed)   08/28/2013  Patient/family provided with [place] Department of Clinical Social Work's list of facilities offering this level of care within the geographic area requested by the patient (or if unable, by the patient's family).  08/28/2013  Patient/family informed of their freedom to choose among providers that offer the needed level of care, that participate in Medicare, Medicaid or managed care program needed by the patient, have an available bed and are willing to accept the patient.  08/28/2013  Patient/family informed of [facility]' ownership interest in [facility], as well as of the fact that they are under no obligation to receive care at this facility.  PASARR submitted to EDS on 08/28/2013 PASARR number received on 08/28/2013  FL2 transmitted to all facilities in geographic area requested by pt/family on  08/28/2013 FL2 transmitted to all facilities within larger geographic area on   Patient informed that his/her managed care company has contracts with or will negotiate with  certain facilities, including the following:     Patient/family informed of bed offers received:  08/28/2013 Patient chooses bed at [facility] Physician recommends and patient chooses bed at    Patient to be transferred to [facility] on  08/30/2013 Patient to be transferred to facility by  [facility] Patient and family notified of transfer on 08/30/2013 Name of family member notified:  patient & daughter, [name] at bedside  The following physician request were entered in Epic:   Additional Comments:   Raynaldo Opitz, [place] Social Worker cell #: [phone]

## 2013-08-30 NOTE — Progress Notes (Signed)
Pt left at this time with EMS headed to [facility]. Pt alert, oriented, and without c/o. Pt left with glasses on and 2 suitcases.

## 2013-08-30 NOTE — Progress Notes (Signed)
Physical Therapy Treatment Patient Details Name: [name] MRN: [id number] DOB: Mar 27, 1919 Today's Date: September 19, 2013    History of Present Illness 80 yo female admitted 08/27/13 with worsening AMS, fall, recent UTI.    PT Comments    Pt feeling better and very agreeable to ambulate with rollator.  Pt hopes to d/c to rehab later today.  Follow Up Recommendations  SNF;Supervision/Assistance - 24 hour     Equipment Recommendations  None recommended by PT    Recommendations for Other Services       Precautions / Restrictions Precautions Precautions: Fall    Mobility  Bed Mobility               General bed mobility comments: pt up in recliner on arrival  Transfers Overall transfer level: Needs assistance Equipment used: Rolling walker (2 wheeled) Transfers: Sit to/from Stand Sit to Stand: Min assist         General transfer comment: verbal cues for safety with rollator (ie brakes), assist to rise and control descent  Ambulation/Gait Ambulation/Gait assistance: Min assist Ambulation Distance (Feet): 200 Feet (total) Assistive device: Rolling walker (2 wheeled) Gait Pattern/deviations: Decreased stride length;Narrow base of support     General Gait Details: 100x2 with rollator, R LE observed increased hip/knee flexion for swing   Stairs            Wheelchair Mobility    Modified Rankin (Stroke Patients Only)       Balance                                    Cognition Arousal/Alertness: Awake/alert Behavior During Therapy: WFL for tasks assessed/performed                        Exercises General Exercises - Lower Extremity Ankle Circles/Pumps: AROM;Both;10 reps Long Arc Quad: AROM;Both;10 reps Hip ABduction/ADduction: AAROM;Both;10 reps Hip Flexion/Marching: AROM;Both;10 reps    General Comments        Pertinent Vitals/Pain No pain reported    Home Living                      Prior Function             PT Goals (current goals can now be found in the care plan section) Progress towards PT goals: Progressing toward goals    Frequency  Min 3X/week    PT Plan Current plan remains appropriate    Co-evaluation             End of Session Equipment Utilized During Treatment: Gait belt Activity Tolerance: Patient tolerated treatment well Patient left: in chair;with call bell/phone within reach     Time: 1013-1040 PT Time Calculation (min): 27 min  Charges:  $Gait Training: 23-37 mins                    G Codes:      Jolynne Spurgin,[name] 09-19-2013, 12:53 PM Carmelia Bake, PT, DPT 09/19/13 Pager: [phone]

## 2013-08-31 ENCOUNTER — Other Ambulatory Visit: Payer: Self-pay | Admitting: *Deleted

## 2013-08-31 MED ORDER — OPIUM TINCTURE (PAREGORIC) 2 MG/5ML PO TINC: 5.0000 mL | Freq: Every day | ORAL | Status: DC | PRN

## 2013-08-31 NOTE — Telephone Encounter (Signed)
[facility] 

## 2013-09-04 ENCOUNTER — Non-Acute Institutional Stay (SKILLED_NURSING_FACILITY): Payer: Medicare Other | Admitting: Internal Medicine

## 2013-09-04 DIAGNOSIS — N183 Chronic kidney disease, stage 3 unspecified: Secondary | ICD-10-CM

## 2013-09-04 DIAGNOSIS — I150 Renovascular hypertension: Secondary | ICD-10-CM

## 2013-09-04 DIAGNOSIS — E039 Hypothyroidism, unspecified: Secondary | ICD-10-CM

## 2013-09-04 DIAGNOSIS — E1149 Type 2 diabetes mellitus with other diabetic neurological complication: Secondary | ICD-10-CM

## 2013-09-07 NOTE — Progress Notes (Signed)
HISTORY & PHYSICAL  DATE: 09/04/2013   FACILITY: [facility]  LEVEL OF CARE: SNF (31)  ALLERGIES:  Allergies  Allergen Reactions  . Ephedrine     Unknown.    . Morphine Other (See Comments)    Difficulty waking up & talking after surgery  . Rocephin [Ceftriaxone Sodium In Dextrose]     Developed acute resp distress, wheezing after 2nd dose.    CHIEF COMPLAINT:  Manage diabetes mellitus, hypertension and hypothyroidism  HISTORY OF PRESENT ILLNESS: Patient is a 80 year old Caucasian female who was hospitalized secondary to dehydration and weakness. After hospitalization she is admitted to this facility for short-term rehabilitation.  DM:pt's DM remains stable.  Pt denies polyuria, polydipsia, polyphagia, changes in vision or hypoglycemic episodes.  No complications noted from the medication presently being used.  Last hemoglobin A1c is: Not available.  HTN: Pt 's HTN remains stable.  Denies CP, sob, DOE, pedal edema, headaches, dizziness or visual disturbances.  No complications from the medications currently being used.  Last BP : 100/60.  HYPOTHYROIDISM: The hypothyroidism remains stable. No complications noted from the medications presently being used.  The patient denies fatigue or constipation.  Last TSH not available.  PAST MEDICAL HISTORY :  Past Medical History  Diagnosis Date  . Myocardial infarction   . Coronary artery disease   . Hypertension   . Dysrhythmia   . Peripheral vascular disease   . Hypothyroidism   . Diabetes mellitus   . Presbyesophagus 04/23/2011  . Urinary retention with incomplete bladder emptying 04/23/2011  . UTI (lower urinary tract infection) 04/21/2011  . Diastolic CHF, chronic     Grade I diastolic dysfunction on Echo 04/26/11  . Anaphylaxis 04/25/2011    Thought to be from Rocephin    PAST SURGICAL HISTORY: Past Surgical History  Procedure Laterality Date  . Cholecystectomy    . Colonoscopy  01/15/2011   Procedure: COLONOSCOPY;  Surgeon: Beryle Beams, MD;  Location: [facility] ENDOSCOPY;  Service: Endoscopy;  Laterality: N/A;  . Amputation Right 04/14/2012    Procedure: AMPUTATION RAY;  Surgeon: Newt Minion, MD;  Location: [facility] ORS;  Service: Orthopedics;  Laterality: Right;  ankle block    SOCIAL HISTORY:  reports that she quit smoking about 32 years ago. Her smoking use included Cigarettes. She smoked 0.00 packs per day. She has never used smokeless tobacco. She reports that she drinks alcohol. She reports that she does not use illicit drugs.  FAMILY HISTORY:  Family History  Problem Relation Age of Onset  . Cancer Sister   . Diabetes Mother   . Heart disease Father     CURRENT MEDICATIONS: Reviewed per MAR/see medication list  REVIEW OF SYSTEMS:  See HPI otherwise 14 point ROS is negative.  PHYSICAL EXAMINATION  VS:  See VS section  GENERAL: no acute distress, normal body habitus EYES: conjunctivae normal, sclerae normal, normal eye lids MOUTH/THROAT: lips without lesions,no lesions in the mouth,tongue is without lesions,uvula elevates in midline NECK: supple, trachea midline, no neck masses, no thyroid tenderness, no thyromegaly LYMPHATICS: no LAN in the neck, no supraclavicular LAN RESPIRATORY: breathing is even & unlabored, BS CTAB CARDIAC: RRR, no murmur,no extra heart sounds, no edema GI:  ABDOMEN: abdomen soft, normal BS, no masses, no tenderness  LIVER/SPLEEN: no hepatomegaly, no splenomegaly MUSCULOSKELETAL: HEAD: normal to inspection  EXTREMITIES: LEFT UPPER EXTREMITY: full range of motion, normal strength & tone RIGHT UPPER EXTREMITY: Mild weakness present LEFT LOWER  EXTREMITY: Moderate range of motion, normal strength & tone RIGHT LOWER EXTREMITY: Mild weakness present PSYCHIATRIC: the patient is alert & oriented to person, affect & behavior appropriate  LABS/RADIOLOGY:  Labs reviewed: Basic Metabolic Panel:  Recent Labs  08/29/13 0345 08/29/13 1806  08/30/13 0425  NA 120* 128* 133*  K 5.1 4.7 4.8  CL 87* 91* 99  CO2 24 28 26   GLUCOSE 99 179* 77  BUN 20 18 17   CREATININE 1.42* 1.19* 1.24*  CALCIUM 8.1* 8.6 8.3*   Liver Function Tests:  Recent Labs  08/27/13 2127  AST 17  ALT 13  ALKPHOS 62  BILITOT 0.3  PROT 8.2  ALBUMIN 4.0    Recent Labs  08/27/13 2127  LIPASE 11   CBC:  Recent Labs  08/27/13 2127 08/28/13 0410 08/29/13 0345 08/30/13 0425  WBC 11.9* 15.2* 9.7 8.1  NEUTROABS 9.4*  --   --   --   HGB 12.7 12.2 10.1* 10.2*  HCT 37.0 36.5 30.1* 30.7*  MCV 82.2 82.0 82.2 83.4  PLT 348 332 271 263   Cardiac Enzymes:  Recent Labs  08/27/13 2127  TROPONINI <0.30   CBG:  Recent Labs  08/29/13 2152 08/30/13 0738 08/30/13 1159  GLUCAP 154* 79 268*    PORTABLE CHEST - 1 VIEW   COMPARISON:  02/04/2012   FINDINGS: Negative for heart failure. Negative for pneumonia or effusion. Increased lung markings appear chronic bilaterally.   IMPRESSION: No active disease.   CT HEAD WITHOUT CONTRAST   CT CERVICAL SPINE WITHOUT CONTRAST   TECHNIQUE: Multidetector CT imaging of the head and cervical spine was performed following the standard protocol without intravenous contrast. Multiplanar CT image reconstructions of the cervical spine were also generated.   COMPARISON:  02/03/2012 head CT cervical spine radiography 05/20/2009   FINDINGS: CT HEAD FINDINGS   Skull and Sinuses:No acute fracture destructive process. Chronic mastoiditis in the left mastoid tip.   Orbits: No acute findings.  Bilateral cataract resection.   Brain: No evidence of acute abnormality, such as acute infarction, hemorrhage, hydrocephalus, or mass lesion/mass effect. Generalized brain atrophy and chronic small vessel disease, expected for age.   CT CERVICAL SPINE FINDINGS   Compression fractures of T2 and T3 with less than 50% height loss. Although new from 02/21/2008, these appear remote based on lack of visible  fracture line or perispinal edema. Mild retropulsion may be present at T3, although there is partial imaging.   No acute cervical spine fracture. Chronic C4-5 anterolisthesis associated with advanced facet osteoarthritis and degenerative disc disease. C5-6 ankylosis of the bodies and posterior elements. Diffuse facet osteoarthritis and degenerative disc change. No gross cervical canal hematoma or prevertebral edema. No advanced osseous canal stenosis.   IMPRESSION: 1. No acute intracranial abnormality. 2. Age related brain atrophy and chronic small vessel disease. 3. No acute cervical spine findings. 4. T2 and T3 compression fractures, favored remote. Correlate with thoracic spine exam.     ASSESSMENT/PLAN:  Diabetes mellitus with neurologic complications-continue Lantus Renovascular Hypertension-well-controlled Hypothyroidism-continue levothyroxine Chronic kidney disease stage III-check renal functions Neuropathy-continue Neurontin Anemia of chronic kidney disease-check hemoglobin Check CBC and BMP  I have reviewed patient's medical records received at admission/from hospitalization.  CPT CODE: 02725  Mael Delap Y Tauno Fal[place]o, [place] [phone]

## 2013-09-12 ENCOUNTER — Encounter: Payer: Self-pay | Admitting: Adult Health

## 2013-09-12 ENCOUNTER — Non-Acute Institutional Stay (SKILLED_NURSING_FACILITY): Payer: Medicare Other | Admitting: Adult Health

## 2013-09-12 DIAGNOSIS — E871 Hypo-osmolality and hyponatremia: Secondary | ICD-10-CM

## 2013-09-12 DIAGNOSIS — I5032 Chronic diastolic (congestive) heart failure: Secondary | ICD-10-CM

## 2013-10-02 ENCOUNTER — Encounter: Payer: Self-pay | Admitting: Adult Health

## 2013-10-02 ENCOUNTER — Non-Acute Institutional Stay (SKILLED_NURSING_FACILITY): Payer: Medicare Other | Admitting: Adult Health

## 2013-10-02 DIAGNOSIS — E1149 Type 2 diabetes mellitus with other diabetic neurological complication: Secondary | ICD-10-CM

## 2013-10-02 DIAGNOSIS — I5032 Chronic diastolic (congestive) heart failure: Secondary | ICD-10-CM

## 2013-10-02 DIAGNOSIS — G609 Hereditary and idiopathic neuropathy, unspecified: Secondary | ICD-10-CM

## 2013-10-02 DIAGNOSIS — R339 Retention of urine, unspecified: Secondary | ICD-10-CM

## 2013-10-02 DIAGNOSIS — I2510 Atherosclerotic heart disease of native coronary artery without angina pectoris: Secondary | ICD-10-CM

## 2013-10-02 DIAGNOSIS — E039 Hypothyroidism, unspecified: Secondary | ICD-10-CM

## 2013-10-02 DIAGNOSIS — I150 Renovascular hypertension: Secondary | ICD-10-CM

## 2013-10-02 NOTE — Progress Notes (Signed)
Patient ID: [name], female   DOB: May 03, 1919, 80 y.o.   MRN: [id number]         PROGRESS NOTE  DATE:    10/02/13  FACILITY:  [facility]  LEVEL OF CARE: SNF (31)  Acute Visit  CHIEF COMPLAINT:    Discharge Notes  HISTORY OF PRESENT ILLNESS: This is a 80 year old female who is for discharge home with Home health PT, OT and Home health Aide. She has been admitted to [facility] on 08/30/13 from [facility] with Dehydration and weakness.Patient was admitted to this facility for short-term rehabilitation after the patient's recent hospitalization.  Patient has completed SNF rehabilitation and therapy has cleared the patient for discharge.    Reassessment of ongoing problem(s):  HTN: Pt 's HTN remains stable.  Denies CP, sob, DOE, pedal edema, headaches, dizziness or visual disturbances.  No complications from the medications currently being used.  Last BP : 131/80  DM:pt's DM remains stable.  Pt denies polyuria, polydipsia, polyphagia, changes in vision or hypoglycemic episodes.  No complications noted from the medication presently being used.   8/15  hemoglobin A1c is: 8.2  CHF:The patient does not relate significant weight changes, denies sob, DOE, orthopnea, PNDs, pedal edema, palpitations or chest pain.  CHF remains stable.  No complications form the medications being used.   PAST MEDICAL HISTORY : Reviewed.  No changes/see problem list  CURRENT MEDICATIONS: Reviewed per MAR/see medication list  REVIEW OF SYSTEMS:  GENERAL: no change in appetite, no fatigue, no weight changes, no fever, chills or weakness RESPIRATORY: no cough, SOB, DOE,  wheezing, hemoptysis CARDIAC: no chest pain, edema or palpitations GI: no abdominal pain, diarrhea, constipation, heart burn, nausea or vomiting  PHYSICAL EXAMINATION  GENERAL: no acute distress, normal body habitus NECK: supple, trachea midline, no neck masses, no thyroid tenderness, no  thyromegaly LYMPHATICS: no LAN in the neck, no supraclavicular LAN RESPIRATORY: breathing is even & unlabored, BS CTAB CARDIAC: RRR, no murmur,no extra heart sounds, no edema GI: abdomen soft, normal BS, no masses, no tenderness, no hepatomegaly, no splenomegaly EXTREMITIES:  able to move all 4 extremities PSYCHIATRIC: the patient is alert & oriented to person, affect & behavior appropriate  LABS/RADIOLOGY: 09/12/13  sodium 132 potassium 4.4 glucose 220 BUN 35 creatinine 1.8 calcium 9.1 09/10/13  sodium 136 potassium 4.7 glucose 222 BUN 46 creatinine 1.9 calcium 8.3 09/05/13  sodium 132 potassium 4.5 glucose 106 BUN 30 creatinine 1.5 calcium 9.0 Labs reviewed: Basic Metabolic Panel:  Recent Labs  08/29/13 0345 08/29/13 1806 08/30/13 0425  NA 120* 128* 133*  K 5.1 4.7 4.8  CL 87* 91* 99  CO2 24 28 26   GLUCOSE 99 179* 77  BUN 20 18 17   CREATININE 1.42* 1.19* 1.24*  CALCIUM 8.1* 8.6 8.3*   Liver Function Tests:  Recent Labs  08/27/13 2127  AST 17  ALT 13  ALKPHOS 62  BILITOT 0.3  PROT 8.2  ALBUMIN 4.0    Recent Labs  08/27/13 2127  LIPASE 11   CBC:  Recent Labs  08/27/13 2127 08/28/13 0410 08/29/13 0345 08/30/13 0425  WBC 11.9* 15.2* 9.7 8.1  NEUTROABS 9.4*  --   --   --   HGB 12.7 12.2 10.1* 10.2*  HCT 37.0 36.5 30.1* 30.7*  MCV 82.2 82.0 82.2 83.4  PLT 348 332 271 263   Cardiac Enzymes:  Recent Labs  08/27/13 2127  TROPONINI <0.30   CBG:  Recent Labs  08/29/13 2152  08/30/13 0738 08/30/13 1159  GLUCAP 154* 79 268*   EXAM: PORTABLE CHEST - 1 VIEW   COMPARISON:  02/04/2012   FINDINGS: Negative for heart failure. Negative for pneumonia or effusion. Increased lung markings appear chronic bilaterally.   IMPRESSION: No active disease. EXAM: CT HEAD WITHOUT CONTRAST   CT CERVICAL SPINE WITHOUT CONTRAST   TECHNIQUE: Multidetector CT imaging of the head and cervical spine was performed following the standard protocol without  intravenous contrast. Multiplanar CT image reconstructions of the cervical spine were also generated.   COMPARISON:  02/03/2012 head CT cervical spine radiography 05/20/2009   FINDINGS: CT HEAD FINDINGS   Skull and Sinuses:No acute fracture destructive process. Chronic mastoiditis in the left mastoid tip.   Orbits: No acute findings.  Bilateral cataract resection.   Brain: No evidence of acute abnormality, such as acute infarction, hemorrhage, hydrocephalus, or mass lesion/mass effect. Generalized brain atrophy and chronic small vessel disease, expected for age.   CT CERVICAL SPINE FINDINGS   Compression fractures of T2 and T3 with less than 50% height loss. Although new from 02/21/2008, these appear remote based on lack of visible fracture line or perispinal edema. Mild retropulsion may be present at T3, although there is partial imaging.   No acute cervical spine fracture. Chronic C4-5 anterolisthesis associated with advanced facet osteoarthritis and degenerative disc disease. C5-6 ankylosis of the bodies and posterior elements. Diffuse facet osteoarthritis and degenerative disc change. No gross cervical canal hematoma or prevertebral edema. No advanced osseous canal stenosis.   IMPRESSION: 1. No acute intracranial abnormality. 2. Age related brain atrophy and chronic small vessel disease. 3. No acute cervical spine findings. 4. T2 and T3 compression fractures, favored remote. Correlate with thoracic spine exam. EXAM: CT HEAD WITHOUT CONTRAST   CT CERVICAL SPINE WITHOUT CONTRAST   TECHNIQUE: Multidetector CT imaging of the head and cervical spine was performed following the standard protocol without intravenous contrast. Multiplanar CT image reconstructions of the cervical spine were also generated.   COMPARISON:  02/03/2012 head CT cervical spine radiography 05/20/2009   FINDINGS: CT HEAD FINDINGS   Skull and Sinuses:No acute fracture destructive process.  Chronic mastoiditis in the left mastoid tip.   Orbits: No acute findings.  Bilateral cataract resection.   Brain: No evidence of acute abnormality, such as acute infarction, hemorrhage, hydrocephalus, or mass lesion/mass effect. Generalized brain atrophy and chronic small vessel disease, expected for age.   CT CERVICAL SPINE FINDINGS   Compression fractures of T2 and T3 with less than 50% height loss. Although new from 02/21/2008, these appear remote based on lack of visible fracture line or perispinal edema. Mild retropulsion may be present at T3, although there is partial imaging.   No acute cervical spine fracture. Chronic C4-5 anterolisthesis associated with advanced facet osteoarthritis and degenerative disc disease. C5-6 ankylosis of the bodies and posterior elements. Diffuse facet osteoarthritis and degenerative disc change. No gross cervical canal hematoma or prevertebral edema. No advanced osseous canal stenosis.   IMPRESSION: 1. No acute intracranial abnormality. 2. Age related brain atrophy and chronic small vessel disease. 3. No acute cervical spine findings. 4. T2 and T3 compression fractures, favored remote. Correlate with thoracic spine exam.   ASSESSMENT/PLAN:  Chronic diastolic heart failure - stable; continue Lasix; check bmp Diabetes Mellitus, type 2 with neurologic complications - continue Lantus Hypertension - well controlled; continue Coreg and Cardizem Hypothyroidism - stable; continue Synthroid Neuropathy - stable; continue Neurontin Urinary retention - continue Flomax CAD - stable; continue in the  I have filled out patient's discharge paperwork and written prescriptions.  Patient will receive home health PT, OT and Home health Aide  Total discharge time: greater than 30 minutes  Discharge time involved coordination of the discharge process with social worker, nursing staff and therapy department. Medical justification for home health services  verified.   CPT CODE: 32549  Seth Bake - NP [facility] [phone]

## 2013-10-02 NOTE — Progress Notes (Signed)
Patient ID: DRISHTI PEPPERMAN, female   DOB: Aug 19, 1919, 80 y.o.   MRN: [id number]         PROGRESS NOTE  DATE: 09/12/2013  FACILITY:  [facility]  LEVEL OF CARE: SNF (31)  Acute Visit  CHIEF COMPLAINT:  Manage Hyponatremia  HISTORY OF PRESENT ILLNESS: This is a 80 year old female who has been noted to have NA 126 - low. She is currently taking Lasix for CHF. No SOB nor edema noted.  PAST MEDICAL HISTORY : Reviewed.  No changes/see problem list  CURRENT MEDICATIONS: Reviewed per MAR/see medication list  REVIEW OF SYSTEMS:  GENERAL: no change in appetite, no fatigue, no weight changes, no fever, chills or weakness RESPIRATORY: no cough, SOB, DOE,  wheezing, hemoptysis CARDIAC: no chest pain, edema or palpitations GI: no abdominal pain, diarrhea, constipation, heart burn, nausea or vomiting  PHYSICAL EXAMINATION  GENERAL: no acute distress, normal body habitus EYES: conjunctivae normal, sclerae normal, normal eye lids NECK: supple, trachea midline, no neck masses, no thyroid tenderness, no thyromegaly LYMPHATICS: no LAN in the neck, no supraclavicular LAN RESPIRATORY: breathing is even & unlabored, BS CTAB CARDIAC: RRR, no murmur,no extra heart sounds, no edema GI: abdomen soft, normal BS, no masses, no tenderness, no hepatomegaly, no splenomegaly EXTREMITIES:  able to move all 4 extremities PSYCHIATRIC: the patient is alert & oriented to person, affect & behavior appropriate  LABS/RADIOLOGY: 09/10/13  sodium 136 potassium 4.7 glucose 222 BUN 46 creatinine 1.9 calcium 8.3 09/05/13  sodium 132 potassium 4.5 glucose 106 BUN 30 creatinine 1.5 calcium 9.0 Labs reviewed: Basic Metabolic Panel:  Recent Labs  08/29/13 0345 08/29/13 1806 08/30/13 0425  NA 120* 128* 133*  K 5.1 4.7 4.8  CL 87* 91* 99  CO2 24 28 26   GLUCOSE 99 179* 77  BUN 20 18 17   CREATININE 1.42* 1.19* 1.24*  CALCIUM 8.1* 8.6 8.3*   Liver Function Tests:  Recent Labs  08/27/13 2127    AST 17  ALT 13  ALKPHOS 62  BILITOT 0.3  PROT 8.2  ALBUMIN 4.0    Recent Labs  08/27/13 2127  LIPASE 11   CBC:  Recent Labs  08/27/13 2127 08/28/13 0410 08/29/13 0345 08/30/13 0425  WBC 11.9* 15.2* 9.7 8.1  NEUTROABS 9.4*  --   --   --   HGB 12.7 12.2 10.1* 10.2*  HCT 37.0 36.5 30.1* 30.7*  MCV 82.2 82.0 82.2 83.4  PLT 348 332 271 263   Cardiac Enzymes:  Recent Labs  08/27/13 2127  TROPONINI <0.30   CBG:  Recent Labs  08/29/13 2152 08/30/13 0738 08/30/13 1159  GLUCAP 154* 79 268*    ASSESSMENT/PLAN:  Hyponatremia - start 0.9 NS at 60 cc/hour x1 L the check BMP  Chronic diastolic heart failure - decrease Lasix to 20 mg by mouth daily  CPT CODE: 49201  Monina Vargas - NP [facility] [phone]

## 2013-10-22 ENCOUNTER — Other Ambulatory Visit: Payer: Self-pay | Admitting: Cardiovascular Disease

## 2013-10-22 NOTE — Telephone Encounter (Signed)
Rx was sent to pharmacy electronically. 

## 2013-11-18 ENCOUNTER — Encounter (HOSPITAL_COMMUNITY): Payer: Self-pay | Admitting: Emergency Medicine

## 2013-11-18 ENCOUNTER — Inpatient Hospital Stay (HOSPITAL_COMMUNITY)
Admission: EM | Admit: 2013-11-18 | Discharge: 2013-11-21 | Disposition: A | Discharge status: Home Health | Payer: Medicare Other | Attending: Internal Medicine | Admitting: Internal Medicine

## 2013-11-18 ENCOUNTER — Emergency Department (HOSPITAL_COMMUNITY): Payer: Medicare Other

## 2013-11-18 DIAGNOSIS — I4892 Unspecified atrial flutter: Secondary | ICD-10-CM | POA: Diagnosis present

## 2013-11-18 DIAGNOSIS — N390 Urinary tract infection, site not specified: Secondary | ICD-10-CM | POA: Diagnosis present

## 2013-11-18 DIAGNOSIS — E1065 Type 1 diabetes mellitus with hyperglycemia: Secondary | ICD-10-CM | POA: Diagnosis present

## 2013-11-18 DIAGNOSIS — R471 Dysarthria and anarthria: Secondary | ICD-10-CM

## 2013-11-18 DIAGNOSIS — E1040 Type 1 diabetes mellitus with diabetic neuropathy, unspecified: Secondary | ICD-10-CM | POA: Diagnosis present

## 2013-11-18 DIAGNOSIS — L03119 Cellulitis of unspecified part of limb: Secondary | ICD-10-CM

## 2013-11-18 DIAGNOSIS — I2510 Atherosclerotic heart disease of native coronary artery without angina pectoris: Secondary | ICD-10-CM | POA: Diagnosis present

## 2013-11-18 DIAGNOSIS — I69322 Dysarthria following cerebral infarction: Secondary | ICD-10-CM | POA: Diagnosis not present

## 2013-11-18 DIAGNOSIS — M7989 Other specified soft tissue disorders: Secondary | ICD-10-CM | POA: Diagnosis not present

## 2013-11-18 DIAGNOSIS — M869 Osteomyelitis, unspecified: Secondary | ICD-10-CM

## 2013-11-18 DIAGNOSIS — R2689 Other abnormalities of gait and mobility: Secondary | ICD-10-CM

## 2013-11-18 DIAGNOSIS — A0472 Enterocolitis due to Clostridium difficile, not specified as recurrent: Secondary | ICD-10-CM

## 2013-11-18 DIAGNOSIS — Z794 Long term (current) use of insulin: Secondary | ICD-10-CM

## 2013-11-18 DIAGNOSIS — I5032 Chronic diastolic (congestive) heart failure: Secondary | ICD-10-CM | POA: Diagnosis present

## 2013-11-18 DIAGNOSIS — E1049 Type 1 diabetes mellitus with other diabetic neurological complication: Secondary | ICD-10-CM | POA: Diagnosis present

## 2013-11-18 DIAGNOSIS — I129 Hypertensive chronic kidney disease with stage 1 through stage 4 chronic kidney disease, or unspecified chronic kidney disease: Secondary | ICD-10-CM | POA: Diagnosis present

## 2013-11-18 DIAGNOSIS — E039 Hypothyroidism, unspecified: Secondary | ICD-10-CM | POA: Diagnosis present

## 2013-11-18 DIAGNOSIS — K219 Gastro-esophageal reflux disease without esophagitis: Secondary | ICD-10-CM | POA: Diagnosis present

## 2013-11-18 DIAGNOSIS — L02611 Cutaneous abscess of right foot: Secondary | ICD-10-CM

## 2013-11-18 DIAGNOSIS — I252 Old myocardial infarction: Secondary | ICD-10-CM | POA: Diagnosis not present

## 2013-11-18 DIAGNOSIS — L03031 Cellulitis of right toe: Secondary | ICD-10-CM

## 2013-11-18 DIAGNOSIS — N183 Chronic kidney disease, stage 3 unspecified: Secondary | ICD-10-CM

## 2013-11-18 DIAGNOSIS — G4700 Insomnia, unspecified: Secondary | ICD-10-CM

## 2013-11-18 DIAGNOSIS — E871 Hypo-osmolality and hyponatremia: Secondary | ICD-10-CM | POA: Diagnosis present

## 2013-11-18 DIAGNOSIS — I10 Essential (primary) hypertension: Secondary | ICD-10-CM | POA: Diagnosis present

## 2013-11-18 DIAGNOSIS — L03115 Cellulitis of right lower limb: Secondary | ICD-10-CM | POA: Diagnosis present

## 2013-11-18 DIAGNOSIS — E11628 Type 2 diabetes mellitus with other skin complications: Secondary | ICD-10-CM

## 2013-11-18 DIAGNOSIS — N179 Acute kidney failure, unspecified: Secondary | ICD-10-CM

## 2013-11-18 DIAGNOSIS — K589 Irritable bowel syndrome without diarrhea: Secondary | ICD-10-CM | POA: Diagnosis present

## 2013-11-18 DIAGNOSIS — L03039 Cellulitis of unspecified toe: Secondary | ICD-10-CM

## 2013-11-18 DIAGNOSIS — L089 Local infection of the skin and subcutaneous tissue, unspecified: Secondary | ICD-10-CM

## 2013-11-18 DIAGNOSIS — IMO0002 Reserved for concepts with insufficient information to code with codable children: Secondary | ICD-10-CM

## 2013-11-18 DIAGNOSIS — L02619 Cutaneous abscess of unspecified foot: Secondary | ICD-10-CM | POA: Diagnosis present

## 2013-11-18 DIAGNOSIS — I739 Peripheral vascular disease, unspecified: Secondary | ICD-10-CM | POA: Diagnosis present

## 2013-11-18 DIAGNOSIS — Z7982 Long term (current) use of aspirin: Secondary | ICD-10-CM | POA: Diagnosis not present

## 2013-11-18 DIAGNOSIS — E860 Dehydration: Secondary | ICD-10-CM

## 2013-11-18 DIAGNOSIS — Z87891 Personal history of nicotine dependence: Secondary | ICD-10-CM

## 2013-11-18 DIAGNOSIS — E1041 Type 1 diabetes mellitus with diabetic mononeuropathy: Secondary | ICD-10-CM

## 2013-11-18 DIAGNOSIS — Z89421 Acquired absence of other right toe(s): Secondary | ICD-10-CM | POA: Diagnosis not present

## 2013-11-18 DIAGNOSIS — R52 Pain, unspecified: Secondary | ICD-10-CM

## 2013-11-18 DIAGNOSIS — E875 Hyperkalemia: Secondary | ICD-10-CM

## 2013-11-18 DIAGNOSIS — Z79899 Other long term (current) drug therapy: Secondary | ICD-10-CM | POA: Diagnosis not present

## 2013-11-18 DIAGNOSIS — E10628 Type 1 diabetes mellitus with other skin complications: Secondary | ICD-10-CM

## 2013-11-18 DIAGNOSIS — N189 Chronic kidney disease, unspecified: Secondary | ICD-10-CM

## 2013-11-18 DIAGNOSIS — N184 Chronic kidney disease, stage 4 (severe): Secondary | ICD-10-CM | POA: Diagnosis present

## 2013-11-18 DIAGNOSIS — R197 Diarrhea, unspecified: Secondary | ICD-10-CM

## 2013-11-18 LAB — COMPREHENSIVE METABOLIC PANEL
ALBUMIN: 3 g/dL — AB (ref 3.5–5.2)
ALT: 7 U/L (ref 0–35)
AST: 10 U/L (ref 0–37)
Alkaline Phosphatase: 58 U/L (ref 39–117)
Anion gap: 11 (ref 5–15)
BUN: 22 mg/dL (ref 6–23)
CALCIUM: 8.9 mg/dL (ref 8.4–10.5)
CHLORIDE: 94 meq/L — AB (ref 96–112)
CO2: 26 mEq/L (ref 19–32)
CREATININE: 1.12 mg/dL — AB (ref 0.50–1.10)
GFR calc Af Amer: 48 mL/min — ABNORMAL LOW (ref >90)
GFR calc non Af Amer: 41 mL/min — ABNORMAL LOW (ref >90)
Glucose, Bld: 351 mg/dL — ABNORMAL HIGH (ref 70–99)
Potassium: 4.6 mEq/L (ref 3.7–5.3)
Sodium: 131 mEq/L — ABNORMAL LOW (ref 137–147)
Total Bilirubin: 0.3 mg/dL (ref 0.3–1.2)
Total Protein: 7 g/dL (ref 6.0–8.3)

## 2013-11-18 LAB — CBC WITH DIFFERENTIAL/PLATELET
BASOS ABS: 0.1 10*3/uL (ref 0.0–0.1)
Basophils Relative: 1 % (ref 0–1)
EOS ABS: 0.4 10*3/uL (ref 0.0–0.7)
EOS PCT: 4 % (ref 0–5)
HEMATOCRIT: 39.3 % (ref 36.0–46.0)
Hemoglobin: 12.8 g/dL (ref 12.0–15.0)
Lymphocytes Relative: 17 % (ref 12–46)
Lymphs Abs: 1.5 10*3/uL (ref 0.7–4.0)
MCH: 27.5 pg (ref 26.0–34.0)
MCHC: 32.6 g/dL (ref 30.0–36.0)
MCV: 84.3 fL (ref 78.0–100.0)
MONO ABS: 0.6 10*3/uL (ref 0.1–1.0)
Monocytes Relative: 7 % (ref 3–12)
Neutro Abs: 6.6 10*3/uL (ref 1.7–7.7)
Neutrophils Relative %: 71 % (ref 43–77)
PLATELETS: 311 10*3/uL (ref 150–400)
RBC: 4.66 MIL/uL (ref 3.87–5.11)
RDW: 12.8 % (ref 11.5–15.5)
WBC: 9.2 10*3/uL (ref 4.0–10.5)

## 2013-11-18 LAB — URINALYSIS, ROUTINE W REFLEX MICROSCOPIC
Bilirubin Urine: NEGATIVE
Glucose, UA: NEGATIVE mg/dL
Ketones, ur: NEGATIVE mg/dL
Nitrite: NEGATIVE
Protein, ur: 100 mg/dL — AB
SPECIFIC GRAVITY, URINE: 1.011 (ref 1.005–1.030)
Urobilinogen, UA: 0.2 mg/dL (ref 0.0–1.0)
pH: 6.5 (ref 5.0–8.0)

## 2013-11-18 LAB — URINE MICROSCOPIC-ADD ON

## 2013-11-18 LAB — BASIC METABOLIC PANEL
ANION GAP: 13 (ref 5–15)
BUN: 27 mg/dL — ABNORMAL HIGH (ref 6–23)
CO2: 27 mEq/L (ref 19–32)
CREATININE: 1.27 mg/dL — AB (ref 0.50–1.10)
Calcium: 9.7 mg/dL (ref 8.4–10.5)
Chloride: 95 mEq/L — ABNORMAL LOW (ref 96–112)
GFR calc Af Amer: 41 mL/min — ABNORMAL LOW (ref >90)
GFR, EST NON AFRICAN AMERICAN: 36 mL/min — AB (ref >90)
Glucose, Bld: 228 mg/dL — ABNORMAL HIGH (ref 70–99)
Potassium: 4.9 mEq/L (ref 3.7–5.3)
Sodium: 135 mEq/L — ABNORMAL LOW (ref 137–147)

## 2013-11-18 LAB — MAGNESIUM: MAGNESIUM: 1.6 mg/dL (ref 1.5–2.5)

## 2013-11-18 LAB — HEMOGLOBIN A1C
HEMOGLOBIN A1C: 9 % — AB (ref <5.7)
MEAN PLASMA GLUCOSE: 212 mg/dL — AB (ref <117)

## 2013-11-18 LAB — TSH: TSH: 0.502 u[IU]/mL (ref 0.350–4.500)

## 2013-11-18 LAB — GLUCOSE, CAPILLARY
GLUCOSE-CAPILLARY: 326 mg/dL — AB (ref 70–99)
Glucose-Capillary: 291 mg/dL — ABNORMAL HIGH (ref 70–99)

## 2013-11-18 MED ORDER — OXYCODONE HCL 5 MG PO TABS: 5.0000 mg | ORAL_TABLET | Freq: Four times a day (QID) | ORAL | Status: DC | PRN

## 2013-11-18 MED ORDER — HEPARIN SODIUM (PORCINE) 5000 UNIT/ML IJ SOLN
5000.0000 [IU] | Freq: Three times a day (TID) | INTRAMUSCULAR | Status: DC
  Administered 2013-11-18 – 2013-11-21 (×9): 5000 [IU] via SUBCUTANEOUS
  Filled 2013-11-18 (×12): qty 1

## 2013-11-18 MED ORDER — ACETAMINOPHEN 650 MG RE SUPP: 650.0000 mg | Freq: Four times a day (QID) | RECTAL | Status: DC | PRN

## 2013-11-18 MED ORDER — CLINDAMYCIN PHOSPHATE 300 MG/50ML IV SOLN
300.0000 mg | Freq: Two times a day (BID) | INTRAVENOUS | Status: DC
  Administered 2013-11-18 – 2013-11-19 (×2): 300 mg via INTRAVENOUS
  Filled 2013-11-18 (×2): qty 50

## 2013-11-18 MED ORDER — SACCHAROMYCES BOULARDII 250 MG PO CAPS
250.0000 mg | ORAL_CAPSULE | Freq: Two times a day (BID) | ORAL | Status: DC
  Administered 2013-11-18 – 2013-11-21 (×7): 250 mg via ORAL
  Filled 2013-11-18 (×8): qty 1

## 2013-11-18 MED ORDER — TAMSULOSIN HCL 0.4 MG PO CAPS
0.4000 mg | ORAL_CAPSULE | Freq: Every morning | ORAL | Status: DC
  Administered 2013-11-18 – 2013-11-21 (×4): 0.4 mg via ORAL
  Filled 2013-11-18 (×4): qty 1

## 2013-11-18 MED ORDER — SODIUM CHLORIDE 0.9 % IV SOLN
INTRAVENOUS | Status: DC
  Administered 2013-11-18: 08:00:00 via INTRAVENOUS

## 2013-11-18 MED ORDER — CIPROFLOXACIN IN D5W 400 MG/200ML IV SOLN
400.0000 mg | Freq: Two times a day (BID) | INTRAVENOUS | Status: DC
  Administered 2013-11-18 – 2013-11-19 (×2): 400 mg via INTRAVENOUS
  Filled 2013-11-18 (×2): qty 200

## 2013-11-18 MED ORDER — DILTIAZEM HCL ER COATED BEADS 180 MG PO CP24
180.0000 mg | ORAL_CAPSULE | Freq: Every day | ORAL | Status: DC
  Administered 2013-11-19 (×2): 180 mg via ORAL
  Filled 2013-11-18 (×4): qty 1

## 2013-11-18 MED ORDER — INSULIN ASPART 100 UNIT/ML ~~LOC~~ SOLN
0.0000 [IU] | Freq: Three times a day (TID) | SUBCUTANEOUS | Status: DC
  Administered 2013-11-18 – 2013-11-19 (×2): 5 [IU] via SUBCUTANEOUS
  Administered 2013-11-19: 1 [IU] via SUBCUTANEOUS
  Administered 2013-11-19: 5 [IU] via SUBCUTANEOUS
  Administered 2013-11-20: 7 [IU] via SUBCUTANEOUS
  Administered 2013-11-20: 5 [IU] via SUBCUTANEOUS
  Administered 2013-11-20: 7 [IU] via SUBCUTANEOUS
  Administered 2013-11-21: 2 [IU] via SUBCUTANEOUS
  Administered 2013-11-21: 7 [IU] via SUBCUTANEOUS

## 2013-11-18 MED ORDER — SODIUM CHLORIDE 0.9 % IJ SOLN: 3.0000 mL | INTRAMUSCULAR | Status: DC | PRN

## 2013-11-18 MED ORDER — CLINDAMYCIN PHOSPHATE 600 MG/50ML IV SOLN
600.0000 mg | Freq: Once | INTRAVENOUS | Status: AC
  Administered 2013-11-18: 600 mg via INTRAVENOUS
  Filled 2013-11-18: qty 50

## 2013-11-18 MED ORDER — SODIUM CHLORIDE 0.9 % IJ SOLN
3.0000 mL | Freq: Two times a day (BID) | INTRAMUSCULAR | Status: DC
  Administered 2013-11-19 – 2013-11-21 (×5): 3 mL via INTRAVENOUS

## 2013-11-18 MED ORDER — ASPIRIN 81 MG PO CHEW
81.0000 mg | CHEWABLE_TABLET | Freq: Every day | ORAL | Status: DC
  Administered 2013-11-18 – 2013-11-21 (×4): 81 mg via ORAL
  Filled 2013-11-18 (×4): qty 1

## 2013-11-18 MED ORDER — SODIUM CHLORIDE 0.9 % IV SOLN: 250.0000 mL | INTRAVENOUS | Status: DC | PRN

## 2013-11-18 MED ORDER — GABAPENTIN 100 MG PO CAPS
100.0000 mg | ORAL_CAPSULE | Freq: Two times a day (BID) | ORAL | Status: DC
  Administered 2013-11-18 – 2013-11-21 (×7): 100 mg via ORAL
  Filled 2013-11-18 (×8): qty 1

## 2013-11-18 MED ORDER — CIPROFLOXACIN IN D5W 400 MG/200ML IV SOLN
400.0000 mg | Freq: Once | INTRAVENOUS | Status: DC
  Filled 2013-11-18: qty 200

## 2013-11-18 MED ORDER — INFLUENZA VAC SPLIT QUAD 0.5 ML IM SUSY
0.5000 mL | PREFILLED_SYRINGE | Freq: Once | INTRAMUSCULAR | Status: AC
  Administered 2013-11-19: 0.5 mL via INTRAMUSCULAR
  Filled 2013-11-18 (×2): qty 0.5

## 2013-11-18 MED ORDER — ACETAMINOPHEN 325 MG PO TABS
650.0000 mg | ORAL_TABLET | Freq: Four times a day (QID) | ORAL | Status: DC | PRN
Start: 2013-11-18 — End: 2013-11-21

## 2013-11-18 MED ORDER — ISOSORBIDE MONONITRATE ER 30 MG PO TB24
30.0000 mg | ORAL_TABLET | Freq: Every day | ORAL | Status: DC
  Administered 2013-11-18 – 2013-11-21 (×4): 30 mg via ORAL
  Filled 2013-11-18 (×4): qty 1

## 2013-11-18 MED ORDER — FAMOTIDINE 20 MG PO TABS
20.0000 mg | ORAL_TABLET | Freq: Every day | ORAL | Status: DC
  Administered 2013-11-18 – 2013-11-21 (×4): 20 mg via ORAL
  Filled 2013-11-18 (×4): qty 1

## 2013-11-18 MED ORDER — CIPROFLOXACIN IN D5W 400 MG/200ML IV SOLN: 400.0000 mg | Freq: Once | INTRAVENOUS | Status: DC

## 2013-11-18 MED ORDER — LEVOTHYROXINE SODIUM 175 MCG PO TABS
175.0000 ug | ORAL_TABLET | Freq: Every day | ORAL | Status: DC
  Administered 2013-11-18 – 2013-11-21 (×4): 175 ug via ORAL
  Filled 2013-11-18 (×5): qty 1

## 2013-11-18 MED ORDER — INSULIN GLARGINE 100 UNIT/ML ~~LOC~~ SOLN
18.0000 [IU] | Freq: Every day | SUBCUTANEOUS | Status: DC
  Administered 2013-11-18 – 2013-11-20 (×3): 18 [IU] via SUBCUTANEOUS
  Filled 2013-11-18 (×3): qty 0.18

## 2013-11-18 MED ORDER — CARVEDILOL 6.25 MG PO TABS
6.2500 mg | ORAL_TABLET | Freq: Two times a day (BID) | ORAL | Status: DC
  Administered 2013-11-18 – 2013-11-21 (×6): 6.25 mg via ORAL
  Filled 2013-11-18 (×8): qty 1

## 2013-11-18 MED ORDER — FUROSEMIDE 20 MG PO TABS
20.0000 mg | ORAL_TABLET | Freq: Every day | ORAL | Status: DC
  Administered 2013-11-18 – 2013-11-20 (×3): 20 mg via ORAL
  Filled 2013-11-18 (×4): qty 1

## 2013-11-18 NOTE — ED Notes (Signed)
Patient transported to X-ray 

## 2013-11-18 NOTE — ED Provider Notes (Signed)
CSN: [id number]     Arrival date & time 11/18/13  0418 History   First MD Initiated Contact with Patient 11/18/13 [phone]     Chief Complaint  Patient presents with  . Toe Pain     (Consider location/radiation/quality/duration/timing/severity/associated sxs/prior Treatment) HPI Comments: Patient here complaining of right great toe pain 10 days. Patient had pain in flexion to both toes and was treated with antibiotics for 7 days which she just finished. States that the left foot improved with the right great toe is worse with increased erythema and redness going up the dorsal surface of her foot. Denies any fever or chills. No vomiting. She does have a history of diabetic neuropathy as she had embraced with a walker. Denies any direct trauma to her foot. Symptoms persistent and nothing makes them better.  Patient is a 81 y.o. female presenting with toe pain. The history is provided by the patient.  Toe Pain    Past Medical History  Diagnosis Date  . Myocardial infarction   . Coronary artery disease   . Hypertension   . Dysrhythmia   . Peripheral vascular disease   . Hypothyroidism   . Diabetes mellitus   . Presbyesophagus 04/23/2011  . Urinary retention with incomplete bladder emptying 04/23/2011  . UTI (lower urinary tract infection) 04/21/2011  . Diastolic CHF, chronic     Grade I diastolic dysfunction on Echo 04/26/11  . Anaphylaxis 04/25/2011    Thought to be from Rocephin   Past Surgical History  Procedure Laterality Date  . Cholecystectomy    . Colonoscopy  01/15/2011    Procedure: COLONOSCOPY;  Surgeon: Beryle Beams, MD;  Location: [facility];  Service: Endoscopy;  Laterality: N/A;  . Amputation Right 04/14/2012    Procedure: AMPUTATION RAY;  Surgeon: Newt Minion, MD;  Location: [facility]S;  Service: Orthopedics;  Laterality: Right;  ankle block   Family History  Problem Relation Age of Onset  . Cancer Sister   . Diabetes Mother   . Heart disease Father    History    Substance Use Topics  . Smoking status: Former Smoker    Types: Cigarettes    Quit date: 04/20/1981  . Smokeless tobacco: Never Used  . Alcohol Use: Yes     Comment: social   OB History   Grav Para Term Preterm Abortions TAB SAB Ect Mult Living                 Review of Systems  All other systems reviewed and are negative.     Allergies  Ephedrine; Morphine; and Rocephin  Home Medications   Prior to Admission medications   Medication Sig Start Date End Date Taking? Authorizing Provider  carvedilol (COREG) 6.25 MG tablet Take 6.25 mg by mouth 2 (two) times daily with a meal.     Yes Historical Provider, MD  diltiazem (CARDIZEM CD) 180 MG 24 hr capsule Take 180 mg by mouth at bedtime.    Yes Historical Provider, MD  furosemide (LASIX) 20 MG tablet Take 20 mg by mouth daily.   Yes Historical Provider, MD  gabapentin (NEURONTIN) 300 MG capsule Take 300 mg by mouth 2 (two) times daily. 02/17/12  Yes Ivan Anchors Love, PA-C  insulin aspart (NOVOLOG) 100 UNIT/ML injection Inject 4 Units into the skin 3 (three) times daily before meals.   Yes Historical Provider, MD  insulin glargine (LANTUS) 100 UNIT/ML injection Inject 28 Units into the skin every morning. 02/17/12  Yes Bary Leriche,  PA-C  isosorbide mononitrate (IMDUR) 30 MG 24 hr tablet Take 30 mg by mouth daily.     Yes Historical Provider, MD  levothyroxine (SYNTHROID, LEVOTHROID) 175 MCG tablet Take 175 mcg by mouth daily before breakfast.   Yes Historical Provider, MD  VITAMIN E PO Take 1 capsule by mouth daily.   Yes Historical Provider, MD  Tamsulosin HCl (FLOMAX) 0.4 MG CAPS Take 0.4 mg by mouth every morning.     Historical Provider, MD   BP 182/58  Pulse 67  Temp(Src) 97.8 F (36.6 C) (Oral)  Resp 20  SpO2 94% Physical Exam  Nursing note and vitals reviewed. Constitutional: She is oriented to person, place, and time. She appears well-developed and well-nourished.  Non-toxic appearance. No distress.  HENT:  Head:  Normocephalic and atraumatic.  Eyes: Conjunctivae, EOM and lids are normal. Pupils are equal, round, and reactive to light.  Neck: Normal range of motion. Neck supple. No tracheal deviation present. No mass present.  Cardiovascular: Normal rate, regular rhythm and normal heart sounds.  Exam reveals no gallop.   No murmur heard. Pulmonary/Chest: Effort normal and breath sounds normal. No stridor. No respiratory distress. She has no decreased breath sounds. She has no wheezes. She has no rhonchi. She has no rales.  Abdominal: Soft. Normal appearance and bowel sounds are normal. She exhibits no distension. There is no tenderness. There is no rebound and no CVA tenderness.  Musculoskeletal: Normal range of motion. She exhibits no edema and no tenderness.       Feet:  Neurological: She is alert and oriented to person, place, and time. No cranial nerve deficit. GCS eye subscore is 4. GCS verbal subscore is 5. GCS motor subscore is 6.  Skin: Skin is warm and dry. No abrasion and no rash noted.  Psychiatric: She has a normal mood and affect. Her speech is normal and behavior is normal.    ED Course  Procedures (including critical care time) Labs Review Labs Reviewed  URINE CULTURE  CBC WITH DIFFERENTIAL  BASIC METABOLIC PANEL  URINALYSIS, ROUTINE W REFLEX MICROSCOPIC    Imaging Review No results found.   EKG Interpretation None      MDM   Final diagnoses:  Pain    Patient started on clindamycin for her foot cellulitis and will be admitted by the hospitalist service    Leota Jacobsen, MD 11/18/13 1020

## 2013-11-18 NOTE — ED Notes (Signed)
Per [facility], pt from home, lives alone.  Pt reports R bigtoe redness denies any pain.

## 2013-11-18 NOTE — H&P (Signed)
[facility] History and Physical  [name] MGN:[id number] DOB: 08-31-1919 DOA: 11/18/2013  Referring physician: Dr. Zenia Resides  PCP: Jani Gravel, MD   Chief Complaint: Right foot swelling and erythema  HPI: [name] is a 80 y.o. female with pmh significant for HTN, CAD, atrial flutter, DM type 1 (on insulin), diabetic neuropathy, hx of stroke with residual dysarthria and atrial flutter; presented to ED complaining of right foot erythema and swelling. Patient reported development of a blister in her right big toe, which subsequently busted and now has swelling and redness of her toe. Erythema is now spreading into foot and lower part of her leg. No pain, no fever, no chills, no nausea or vomiting. Patient has hx of previous amputation due to diabetic ulcer, and was found to have also an UTI. [facility] called to admit patient for further evaluation and treatment.   Review of Systems:  Negative except as mentioned on HPI.  Past Medical History  Diagnosis Date  . Myocardial infarction   . Coronary artery disease   . Hypertension   . Dysrhythmia   . Peripheral vascular disease   . Hypothyroidism   . Diabetes mellitus   . Presbyesophagus 04/23/2011  . Urinary retention with incomplete bladder emptying 04/23/2011  . UTI (lower urinary tract infection) 04/21/2011  . Diastolic CHF, chronic     Grade I diastolic dysfunction on Echo 04/26/11  . Anaphylaxis 04/25/2011    Thought to be from Rocephin   Past Surgical History  Procedure Laterality Date  . Cholecystectomy    . Colonoscopy  01/15/2011    Procedure: COLONOSCOPY;  Surgeon: Beryle Beams, MD;  Location: [facility];  Service: Endoscopy;  Laterality: N/A;  . Amputation Right 04/14/2012    Procedure: AMPUTATION RAY;  Surgeon: Newt Minion, MD;  Location: [facility];  Service: Orthopedics;  Laterality: Right;  ankle block   Social History:  reports that she quit smoking about 32 years ago. Her smoking use included Cigarettes. She  smoked 0.00 packs per day. She has never used smokeless tobacco. She reports that she drinks alcohol. She reports that she does not use illicit drugs.  Allergies  Allergen Reactions  . Ephedrine     Unknown.    . Morphine Other (See Comments)    Difficulty waking up & talking after surgery  . Rocephin [Ceftriaxone Sodium In Dextrose]     Developed acute resp distress, wheezing after 2nd dose.    Family History  Problem Relation Age of Onset  . Cancer Sister   . Diabetes Mother   . Heart disease Father      Prior to Admission medications   Medication Sig Start Date End Date Taking? Authorizing Provider  aspirin 81 MG tablet Take 81 mg by mouth daily.   Yes Historical Provider, MD  carvedilol (COREG) 6.25 MG tablet Take 6.25 mg by mouth 2 (two) times daily with a meal.     Yes Historical Provider, MD  Cholecalciferol (VITAMIN D PO) Take 1 tablet by mouth daily.   Yes Historical Provider, MD  diltiazem (CARDIZEM CD) 180 MG 24 hr capsule Take 180 mg by mouth at bedtime.    Yes Historical Provider, MD  furosemide (LASIX) 20 MG tablet Take 20 mg by mouth daily.   Yes Historical Provider, MD  gabapentin (NEURONTIN) 300 MG capsule Take 300 mg by mouth 2 (two) times daily. 02/17/12  Yes Ivan Anchors Love, PA-C  insulin aspart (NOVOLOG) 100 UNIT/ML injection Inject 5 Units into the  skin 3 (three) times daily before meals.    Yes Historical Provider, MD  insulin glargine (LANTUS) 100 UNIT/ML injection Inject 28 Units into the skin every morning. 02/17/12  Yes Ivan Anchors Love, PA-C  isosorbide mononitrate (IMDUR) 30 MG 24 hr tablet Take 30 mg by mouth daily.     Yes Historical Provider, MD  levothyroxine (SYNTHROID, LEVOTHROID) 175 MCG tablet Take 175 mcg by mouth daily before breakfast.   Yes Historical Provider, MD  Tamsulosin HCl (FLOMAX) 0.4 MG CAPS Take 0.4 mg by mouth every morning.    Yes Historical Provider, MD  VITAMIN E PO Take 1 capsule by mouth daily.   Yes Historical Provider, MD    Physical Exam: Filed Vitals:   11/18/13 0433 11/18/13 1141  BP: 182/58 194/64  Pulse: 67 72  Temp: 97.8 F (36.6 C) 97.9 F (36.6 C)  TempSrc: Oral Oral  Resp: 20 18  Height:  5\' 8"  (1.727 m)  Weight:  74.617 kg (164 lb 8 oz)  SpO2: 94% 94%    Wt Readings from Last 3 Encounters:  11/18/13 74.617 kg (164 lb 8 oz)  10/02/13 75.66 kg (166 lb 12.8 oz)  09/12/13 75.569 kg (166 lb 9.6 oz)    General:  Appears calm and comfortable; AAOX3; afebrile. Reports no pain on her foot. Eyes: PERRL, normal lids, irises & conjunctiva, no icterus, no nystagmus ENT: grossly normal hearing, MMM, no erythema or exudates inside her mouth Neck: no LAD, masses or thyromegaly; no JVD Cardiovascular: regular rate, no rubs or gallops Respiratory: CTA bilaterally, no w/r/r. Normal respiratory effort. Abdomen: soft, nt, nd; positive BS Skin: right big toe and foot with erythema, swelling and calluses formation Musculoskeletal: grossly normal tone BUE/BLE; except for swelling of right big toe Psychiatric: grossly normal mood and affect, no hallucinations Neurologic: moves four limbs spontaneously, baseline residual dysarthria, no new focal deficit.          Labs on Admission:  Basic Metabolic Panel:  Recent Labs Lab 11/18/13 0810  NA 135*  K 4.9  CL 95*  CO2 27  GLUCOSE 228*  BUN 27*  CREATININE 1.27*  CALCIUM 9.7   CBC:  Recent Labs Lab 11/18/13 0810  WBC 9.2  NEUTROABS 6.6  HGB 12.8  HCT 39.3  MCV 84.3  PLT 311   BNP (last 3 results)  Recent Labs  08/27/13 2127  PROBNP 231.0   Radiological Exams on Admission: Dg Foot 2 Views Right  11/18/2013   CLINICAL DATA:  Patient with right great toe redness and superficial sores. Additional less severe similar areas along a other toes. History of amputation of the right fourth toe 1 and half years ago. History of diabetes. Current symptoms for 1 and half weeks.  EXAM: RIGHT FOOT - 2 VIEW  COMPARISON:  Right foot MRI, 04/12/2012  and right foot radiographs, 04/11/2012.  FINDINGS: There is soft tissue swelling and evidence of distal superficial ulceration of the great toe. There are no areas of bone resorption to suggest osteomyelitis. There is no acute fracture. Joints are normally aligned. There is an old healed fracture of the third metatarsal. Fourth toe and distal fourth metatarsal have been resected. Resected margin is smooth.  Bones are diffusely demineralized.  IMPRESSION: 1. No fracture or dislocation. 2. No radiographic evidence of osteomyelitis.   Electronically Signed   By: Lajean Manes M.D.   On: 11/18/2013 07:54    EKG:  None  Assessment/Plan 1-right foot Cellulitis: affecting mainly dorsal aspect and big toe.  Patient with blister in the toe and some callous approx 1 week PTA, now has popped and most likely serve as entrance point. No pain. -x-ray w/o findings of osteomyelitis -will admit to med-surg -will treat with cleocin and cipro -PRN antiemetics -foot elevation -follow clinical response  2-HYPERTENSION, BENIGN ESSENTIAL: continue home medication regimen -patient on low sodium diet  3-UTI (lower urinary tract infection): empirically on cipro -follow urine cultures -reports some frequency but not dysuria  4-Hypothyroidism: will check TSH -continue synthroid  5-CKD (chronic kidney disease) stage 3, GFR 30-59 ml/min: essentially at baseline -will monitor renal function -due to CKD, vancomycin was avoided  6-GERD (gastroesophageal reflux disease): continue famotidine  7-chronic diastolic heart failure: -currently euvolemic and compensated -will continue home medication regimen -low sodium diet, strict I's and O's and daily weight  8-Type 1 diabetes mellitus with neurological manifestations, uncontrolled: will check a1C -continue lantus, low carb diet and SSI -continue neurontin  9-Atrial flutter: will continue metoprolol, cardizem and ASA  10-chronic diarrhea/IBS: will use  florastor.  11-hx of stroke with residual dysarthria: at baseline; no new focal deficit -continue ASA for secondary prevention  Code Status: Full DVT Prophylaxis:heprain  Family Communication: no family at bedside Disposition Plan: LOS > 2 midnights, inpatient; med-surg  Time spent: 31 minutes  [place], [facility] Pager [phone]

## 2013-11-18 NOTE — ED Notes (Signed)
Patient back from x-ray 

## 2013-11-18 NOTE — ED Notes (Signed)
Patient incontinent of both urine and stool Patient cleaned up, peri care given, diaper applied Call back number left for [place] for report

## 2013-11-18 NOTE — Progress Notes (Signed)
NT checked pt's CBG after eating late lunch after arrival to floor, CBG elevated.  Asked NT to recheck CBG closer to dinner time, pt new CBG 291 with 5 units Novolog coverage

## 2013-11-19 DIAGNOSIS — L02611 Cutaneous abscess of right foot: Secondary | ICD-10-CM

## 2013-11-19 DIAGNOSIS — L03115 Cellulitis of right lower limb: Secondary | ICD-10-CM | POA: Diagnosis not present

## 2013-11-19 DIAGNOSIS — E871 Hypo-osmolality and hyponatremia: Secondary | ICD-10-CM

## 2013-11-19 DIAGNOSIS — L03031 Cellulitis of right toe: Secondary | ICD-10-CM

## 2013-11-19 LAB — GLUCOSE, CAPILLARY
GLUCOSE-CAPILLARY: 252 mg/dL — AB (ref 70–99)
Glucose-Capillary: 138 mg/dL — ABNORMAL HIGH (ref 70–99)
Glucose-Capillary: 290 mg/dL — ABNORMAL HIGH (ref 70–99)
Glucose-Capillary: 262 mg/dL — ABNORMAL HIGH (ref 70–99)
Glucose-Capillary: 285 mg/dL — ABNORMAL HIGH (ref 70–99)

## 2013-11-19 LAB — URINE CULTURE: Colony Count: 75000

## 2013-11-19 LAB — MRSA PCR SCREENING: MRSA BY PCR: NEGATIVE

## 2013-11-19 MED ORDER — PIPERACILLIN-TAZOBACTAM 3.375 G IVPB
3.3750 g | Freq: Three times a day (TID) | INTRAVENOUS | Status: DC
  Administered 2013-11-19 – 2013-11-21 (×6): 3.375 g via INTRAVENOUS
  Filled 2013-11-19 (×7): qty 50

## 2013-11-19 MED ORDER — VANCOMYCIN HCL IN DEXTROSE 750-5 MG/150ML-% IV SOLN
750.0000 mg | INTRAVENOUS | Status: DC
Start: 2013-11-19 — End: 2013-11-21
  Administered 2013-11-19 – 2013-11-20 (×2): 750 mg via INTRAVENOUS
  Filled 2013-11-19 (×3): qty 150

## 2013-11-19 NOTE — Evaluation (Signed)
Physical Therapy Evaluation Patient Details Name: [name] MRN: [id number] DOB: [birth year] Today's Date: 11/19/2013   History of Present Illness   80 y.o. female with PMHx significant for HTN, CAD, atrial flutter, DM type 1 (on insulin), diabetic neuropathy, hx of stroke with residual dysarthria and atrial flutter admitted with right foot erythema and swelling.  Clinical Impression  Pt currently with functional limitations due to the deficits listed below (see PT Problem List).  Pt will benefit from skilled PT to increase their independence and safety with mobility to allow discharge to the venue listed below.  Pt reports just finishing rehab at SNF so plans to d/c home and will have daughter and caregiver to assist.        Follow Up Recommendations Home health PT;Supervision for mobility/OOB    Equipment Recommendations  None recommended by PT    Recommendations for Other Services       Precautions / Restrictions Precautions Precautions: Fall Restrictions Weight Bearing Restrictions: No      Mobility  Bed Mobility Overal bed mobility: Needs Assistance Bed Mobility: Supine to Sit;Sit to Supine     Supine to sit: Min assist Sit to supine: Min guard   General bed mobility comments: verbal cues for technique  Transfers Overall transfer level: Needs assistance Equipment used: Rolling walker (2 wheeled) Transfers: Sit to/from Stand Sit to Stand: Min assist         General transfer comment: assist to rise and control descent  Ambulation/Gait Ambulation/Gait assistance: Min guard Ambulation Distance (Feet): 72 Feet Assistive device: Rolling walker (2 wheeled) Gait Pattern/deviations: Step-through pattern;Narrow base of support;Decreased stride length     General Gait Details: pt ambulated around room, gave verbal cues to try to heel walk with R LE due to open blister R toe (WOC RN to see per pt) however pt not following and making full foot floor contact near  end of ambulation, wound weeping minimally blood so assisted pt back to bed to elevate LEs  Stairs            Wheelchair Mobility    Modified Rankin (Stroke Patients Only)       Balance Overall balance assessment:  (reports no recent falls)                                           Pertinent Vitals/Pain Pain Assessment: No/denies pain    Home Living Family/patient expects to be discharged to:: Private residence Living Arrangements: Alone Available Help at Discharge: Family;Personal care attendant Type of Home: House Home Access: Stairs to enter     Home Layout: One level Home Equipment: Environmental consultant - 4 wheels      Prior Function Level of Independence: Independent with assistive device(s)         Comments: reports just finishing rehab at [facility]        Extremity/Trunk Assessment               Lower Extremity Assessment: Generalized weakness         Communication   Communication: No difficulties  Cognition Arousal/Alertness: Awake/alert Behavior During Therapy: WFL for tasks assessed/performed Overall Cognitive Status: Within Functional Limits for tasks assessed                      General Comments  Exercises        Assessment/Plan    PT Assessment Patient needs continued PT services  PT Diagnosis Difficulty walking   PT Problem List Decreased mobility;Decreased activity tolerance  PT Treatment Interventions DME instruction;Gait training;Functional mobility training;Patient/family education;Stair training;Therapeutic activities;Therapeutic exercise   PT Goals (Current goals can be found in the Care Plan section) Acute Rehab PT Goals PT Goal Formulation: With patient Time For Goal Achievement: 11/26/13 Potential to Achieve Goals: Good    Frequency Min 3X/week   Barriers to discharge        Co-evaluation               End of Session   Activity Tolerance: Patient  tolerated treatment well Patient left: in bed;with call bell/phone within reach;with bed alarm set           Time: 1002-1030 PT Time Calculation (min): 28 min   Charges:   PT Evaluation $Initial PT Evaluation Tier I: 1 Procedure PT Treatments $Gait Training: 23-37 mins   PT G Codes:          Nasiah Polinsky,[name] 11/19/2013, 11:00 AM Carmelia Bake, PT, DPT 11/19/2013 Pager: [phone]

## 2013-11-19 NOTE — Consult Note (Signed)
WOC wound consult note Reason for Consult:Cellulitis on RGT Wound type:Infectious Pressure Ulcer POA:No Measurement: 3cm x 4cm area of erythema with ruptured blister (partial thickness) Wound KTG:YBWLS, pink and with dried serum Drainage (amount, consistency, odor) none Periwound:erythematous Dressing procedure/placement/frequency:I will implement a saline continually moist dressing to the area to potentially draw any infectious process while she is simultaneously treated with systemic antibiotics.  We will additionally protect this area with the use of a unilateral Prevalon boot. [facility] nursing team will not follow, but will remain available to this patient, the nursing and medical teams.  Please re-consult if needed. Thanks, Maudie Flakes, MSN, RN, [place], [place], [place] [phone])

## 2013-11-19 NOTE — Progress Notes (Addendum)
[facility]                                                                              Patient Demographics  [name], is a 80 y.o. female, DOB - [birth year], KGM:[id number]  Admit date - 11/18/2013   Admitting Physician No admitting provider for patient encounter.  Outpatient Primary MD for the patient is Jani Gravel, MD  LOS - 1   Chief Complaint  Patient presents with  . Toe Pain      Interim history 80 year old with a history of hypertension, coronary artery disease, atrial flutter, diabetes mellitus type 1, diabetic neuropathy, history of stroke with residual dysarthria presented to the ED complaining of right foot erythema and swelling particularly in the great toe. Patient was found to have cellulitis of the foot and started on clindamycin and ciprofloxacin. This was changed to vancomycin and Zosyn. Wound care has been consulted. Foot x-ray showed no fracture dislocation no evidence of osteomyelitis. PT evaluate patient and recommended home health.  Assessment & Plan   Right foot cellulitis -Particularly in the great toe -X-ray of the right foot showed no osteomyelitis -Patient currently on Zosyn and vancomycin -Will consult wound care -Patient currently afebrile with no leukocytosis.  Essential hypertension -Stable, continue home medications  Urinary tract infection -Continue antibiotics -Patient denies dysuria -Urine culture pending  Hypothyroidism -TSH 0.502 -Continue Synthroid  Atrial flutter -Currently in sinus, continue diltiazem  Chronic hyponatremia -Patient's baseline appears to be 12, currently 131 -Will continue to monitor his BMP  Chronic kidney disease, stage III -Creatinine appears to be at baseline -Vancomycin renally dosed  GERD -Continue famotidine  Chronic diastolic heart failure -Appears to be compensated and euvolemic -Echocardiogram January 2014 2008 6440%, grade 2 diastolic dysfunction -Continue daily weights, monitor  intake and output -Continue Lasix, Coreg  Diabetes mellitus, type I with neurological manifestations -Continue Lantus, insulin sliding-scale CBG monitoring -Continue gabapentin for neuropathy -Hemoglobin A1c 9.0 (unfortuantely appears to be her baseline)  Chronic diarrhea/irritable bowel syndrome -Stable, continue florastor  History CVA -Patient has residual dysarthria -Appears to be stable at baseline -Continue aspirin for prevention  Code Status: Full  Family Communication: None at Bedside  Disposition Plan: Admitted  Time Spent in minutes   30 minutes  Procedures  None  Consults   None  DVT Prophylaxis  heparin  Lab Results  Component Value Date   PLT 311 11/18/2013    Medications  Scheduled Meds: . aspirin  81 mg Oral Daily  . carvedilol  6.25 mg Oral BID WC  . diltiazem  180 mg Oral QHS  . famotidine  20 mg Oral Daily  . furosemide  20 mg Oral Daily  . gabapentin  100 mg Oral BID  . heparin  5,000 Units Subcutaneous 3 times per day  . insulin aspart  0-9 Units Subcutaneous TID WC  . insulin glargine  18 Units Subcutaneous Daily  . isosorbide mononitrate  30 mg Oral Daily  . levothyroxine  175 mcg Oral QAC breakfast  . piperacillin-tazobactam (ZOSYN)  IV  3.375 g Intravenous 3 times per day  . saccharomyces boulardii  250 mg Oral BID  . sodium chloride  3 mL Intravenous Q12H  .  tamsulosin  0.4 mg Oral q morning - 10a  . vancomycin  750 mg Intravenous Q24H   Continuous Infusions:  PRN Meds:.sodium chloride, acetaminophen, acetaminophen, oxyCODONE, sodium chloride  Antibiotics    Anti-infectives   Start     Dose/Rate Route Frequency Ordered Stop   11/19/13 1200  vancomycin (VANCOCIN) IVPB 750 mg/150 ml premix     750 mg 150 mL/hr over 60 Minutes Intravenous Every 24 hours 11/19/13 1039     11/19/13 1100  piperacillin-tazobactam (ZOSYN) IVPB 3.375 g     3.375 g 12.5 mL/hr over 240 Minutes Intravenous 3 times per day 11/19/13 1039     11/18/13  2000  clindamycin (CLEOCIN) IVPB 300 mg  Status:  Discontinued     300 mg 100 mL/hr over 30 Minutes Intravenous Every 12 hours 11/18/13 1257 11/19/13 1025   11/18/13 1400  ciprofloxacin (CIPRO) IVPB 400 mg  Status:  Discontinued     400 mg 200 mL/hr over 60 Minutes Intravenous Every 12 hours 11/18/13 1257 11/19/13 1025   11/18/13 1215  ciprofloxacin (CIPRO) IVPB 400 mg  Status:  Discontinued     400 mg 200 mL/hr over 60 Minutes Intravenous  Once 11/18/13 1208 11/18/13 1257   11/18/13 1030  ciprofloxacin (CIPRO) IVPB 400 mg  Status:  Discontinued     400 mg 200 mL/hr over 60 Minutes Intravenous  Once 11/18/13 1017 11/18/13 1207   11/18/13 0730  clindamycin (CLEOCIN) IVPB 600 mg     600 mg 100 mL/hr over 30 Minutes Intravenous  Once 11/18/13 0724 11/18/13 0900        Subjective:   [name] seen and examined today.  Patient expected her cellulitis to have improved since admission, and is very upset that it is not. She denies any chest pain, shortness of breath, dizziness, headache, abdominal pain.  Objective:   Filed Vitals:   11/18/13 0433 11/18/13 1141 11/18/13 2115 11/19/13 0525  BP: 182/58 194/64 142/58 147/57  Pulse: 67 72 66 72  Temp: 97.8 F (36.6 C) 97.9 F (36.6 C) 98.3 F (36.8 C) 98.4 F (36.9 C)  TempSrc: Oral Oral Oral Oral  Resp: 20 18 18 18   Height:  5\' 8"  (1.727 m)    Weight:  74.617 kg (164 lb 8 oz)    SpO2: 94% 94% 95% 96%    Wt Readings from Last 3 Encounters:  11/18/13 74.617 kg (164 lb 8 oz)  10/02/13 75.66 kg (166 lb 12.8 oz)  09/12/13 75.569 kg (166 lb 9.6 oz)     Intake/Output Summary (Last 24 hours) at 11/19/13 1253 Last data filed at 11/18/13 1620  Gross per 24 hour  Intake    300 ml  Output    500 ml  Net   -200 ml    Exam  General: Well developed, well nourished, NAD, appears stated age  HEENT: NCAT, mucous membranes moist.   Cardiovascular: S1 S2 auscultated, no rubs, murmurs or gallops. Regular rate and  rhythm.  Respiratory: Clear to auscultation bilaterally with equal chest rise  Abdomen: Soft, nontender, nondistended, + bowel sounds  Extremities: warm dry without cyanosis clubbing or edema, Right great toe: erythema/edema, callus formation with open wound  Neuro: AAOx3, no new focal deficits, has dysarthria from prior stroke  Skin: Without rashes exudates or nodules  Psych: Appropriate affect and demeanor  Data Review   Micro Results Recent Results (from the past 240 hour(s))  URINE CULTURE     Status: None   Collection Time  11/18/13  7:50 AM      Result Value Ref Range Status   Specimen Description URINE, CLEAN CATCH   Final   Special Requests NONE   Final   Culture  Setup Time     Final   Value: 11/18/2013 09:52     Performed at [facility]     Final   Value: 75,000 COLONIES/ML     Performed at [facility]   Culture     Final   Value: Multiple bacterial morphotypes present, none predominant. Suggest appropriate recollection if clinically indicated.     Performed at [facility]   Report Status 11/19/2013 FINAL   Final    Radiology Reports Dg Foot 2 Views Right  11/18/2013   CLINICAL DATA:  Patient with right great toe redness and superficial sores. Additional less severe similar areas along a other toes. History of amputation of the right fourth toe 1 and half years ago. History of diabetes. Current symptoms for 1 and half weeks.  EXAM: RIGHT FOOT - 2 VIEW  COMPARISON:  Right foot MRI, 04/12/2012 and right foot radiographs, 04/11/2012.  FINDINGS: There is soft tissue swelling and evidence of distal superficial ulceration of the great toe. There are no areas of bone resorption to suggest osteomyelitis. There is no acute fracture. Joints are normally aligned. There is an old healed fracture of the third metatarsal. Fourth toe and distal fourth metatarsal have been resected. Resected margin is smooth.  Bones are diffusely  demineralized.  IMPRESSION: 1. No fracture or dislocation. 2. No radiographic evidence of osteomyelitis.   Electronically Signed   By: Lajean Manes M.D.   On: 11/18/2013 07:54    CBC  Recent Labs Lab 11/18/13 0810  WBC 9.2  HGB 12.8  HCT 39.3  PLT 311  MCV 84.3  MCH 27.5  MCHC 32.6  RDW 12.8  LYMPHSABS 1.5  MONOABS 0.6  EOSABS 0.4  BASOSABS 0.1    Chemistries   Recent Labs Lab 11/18/13 0810 11/18/13 1523  NA 135* 131*  K 4.9 4.6  CL 95* 94*  CO2 27 26  GLUCOSE 228* 351*  BUN 27* 22  CREATININE 1.27* 1.12*  CALCIUM 9.7 8.9  MG 1.6  --   AST  --  10  ALT  --  7  ALKPHOS  --  58  BILITOT  --  0.3   ------------------------------------------------------------------------------------------------------------------ estimated creatinine clearance is 32.3 ml/min (by C-G formula based on Cr of 1.12). ------------------------------------------------------------------------------------------------------------------  Recent Labs  11/18/13 0810  HGBA1C 9.0*   ------------------------------------------------------------------------------------------------------------------ No results found for this basename: CHOL, HDL, LDLCALC, TRIG, CHOLHDL, LDLDIRECT,  in the last 72 hours ------------------------------------------------------------------------------------------------------------------  Recent Labs  11/18/13 1250  TSH 0.502   ------------------------------------------------------------------------------------------------------------------ No results found for this basename: VITAMINB12, FOLATE, FERRITIN, TIBC, IRON, RETICCTPCT,  in the last 72 hours  Coagulation profile No results found for this basename: INR, PROTIME,  in the last 168 hours  No results found for this basename: DDIMER,  in the last 72 hours  Cardiac Enzymes No results found for this basename: CK, CKMB, TROPONINI, MYOGLOBIN,  in the last 168  hours ------------------------------------------------------------------------------------------------------------------ No components found with this basename: POCBNP,     [name] D.O. on 11/19/2013 at 12:53 PM  Between 7am to 7pm - Pager - [phone]  After 7pm go to [email or web address] - password [email or web address]  And look for the night coverage person covering for me after hours  [facility] Office  [phone]  

## 2013-11-19 NOTE — Progress Notes (Signed)
ANTIBIOTIC CONSULT NOTE - INITIAL  Pharmacy Consult for Vancomycin / Zosyn Indication: cellulitis  Allergies  Allergen Reactions  . Ephedrine     Unknown.    . Morphine Other (See Comments)    Difficulty waking up & talking after surgery  . Rocephin [Ceftriaxone Sodium In Dextrose]     Developed acute resp distress, wheezing after 2nd dose.    Patient Measurements: Height: 5\' 8"  (172.7 cm) Weight: 164 lb 8 oz (74.617 kg) IBW/kg (Calculated) : 63.9  Vital Signs: Temp: 98.4 F (36.9 C) (10/26 0525) Temp Source: Oral (10/26 0525) BP: 147/57 mmHg (10/26 0525) Pulse Rate: 72 (10/26 0525) Intake/Output from previous day: 10/25 0701 - 10/26 0700 In: 300 [P.O.:300] Out: 500 [Urine:500] Intake/Output from this shift:    Labs:  Recent Labs  11/18/13 0810 11/18/13 1523  WBC 9.2  --   HGB 12.8  --   PLT 311  --   CREATININE 1.27* 1.12*   Estimated Creatinine Clearance: 32.3 ml/min (by C-G formula based on Cr of 1.12). No results found for this basename: VANCOTROUGH, Corlis Leak, VANCORANDOM, [place], GENTPEAK, [place], TOBRATROUGH, TOBRAPEAK, TOBRARND, AMIKACINPEAK, AMIKACINTROU, AMIKACIN,  in the last 72 hours   Microbiology: Recent Results (from the past 720 hour(s))  URINE CULTURE     Status: None   Collection Time    11/18/13  7:50 AM      Result Value Ref Range Status   Specimen Description URINE, CLEAN CATCH   Final   Special Requests NONE   Final   Culture  Setup Time     Final   Value: 11/18/2013 09:52     Performed at [place]     Final   Value: 75,000 COLONIES/ML     Performed at [facility]   Culture     Final   Value: Multiple bacterial morphotypes present, none predominant. Suggest appropriate recollection if clinically indicated.     Performed at [facility]   Report Status 11/19/2013 FINAL   Final    Medical History: Past Medical History  Diagnosis Date  . Myocardial infarction   . Coronary  artery disease   . Hypertension   . Dysrhythmia   . Peripheral vascular disease   . Hypothyroidism   . Diabetes mellitus   . Presbyesophagus 04/23/2011  . Urinary retention with incomplete bladder emptying 04/23/2011  . UTI (lower urinary tract infection) 04/21/2011  . Diastolic CHF, chronic     Grade I diastolic dysfunction on Echo 04/26/11  . Anaphylaxis 04/25/2011    Thought to be from Rocephin    Assessment: 10/26 89 yoF with PMH significant for HTN, CAD, atrial flutter, DM, diabetic neuropathy, hx of stroke, presented to ED complaining of right foot erythema and swelling. Patient has hx of previous amputation due to diabetic ulcer. Pharmacy consulted to dose Vanc/Zosyn for cellulitis.  10/26 >>Vanc  >> 10/26 >>Zosyn  >>    Tmax: AF WBCs: WNL Renal: SCr 1.12 CrCl 1ml/min  10/25 urine: multiple bacterial morphotypes. Final  Goal of Therapy:  Goal Vancomycin trough 15-20 ug/mL Appropriate antibiotic dosing for renal function; eradication of infection  Plan:  Start Zosyn 3.375g IV Q8H (extended infusion over 4 hours) Start Vancomycin 750mg  IV Q24H F/u culture results, follow renal function Obtain vancomycin trough at steady state  Kizzie Furnish, PharmD Pager: [phone] 11/19/2013 10:38 AM

## 2013-11-20 ENCOUNTER — Inpatient Hospital Stay (HOSPITAL_COMMUNITY): Payer: Medicare Other

## 2013-11-20 DIAGNOSIS — E10628 Type 1 diabetes mellitus with other skin complications: Secondary | ICD-10-CM

## 2013-11-20 DIAGNOSIS — L089 Local infection of the skin and subcutaneous tissue, unspecified: Secondary | ICD-10-CM

## 2013-11-20 DIAGNOSIS — E1169 Type 2 diabetes mellitus with other specified complication: Secondary | ICD-10-CM

## 2013-11-20 LAB — BASIC METABOLIC PANEL
Anion gap: 12 (ref 5–15)
BUN: 22 mg/dL (ref 6–23)
CO2: 25 mEq/L (ref 19–32)
Calcium: 8.5 mg/dL (ref 8.4–10.5)
Chloride: 96 mEq/L (ref 96–112)
Creatinine, Ser: 1.39 mg/dL — ABNORMAL HIGH (ref 0.50–1.10)
GFR, EST AFRICAN AMERICAN: 37 mL/min — AB (ref >90)
GFR, EST NON AFRICAN AMERICAN: 32 mL/min — AB (ref >90)
Glucose, Bld: 250 mg/dL — ABNORMAL HIGH (ref 70–99)
POTASSIUM: 4 meq/L (ref 3.7–5.3)
Sodium: 133 mEq/L — ABNORMAL LOW (ref 137–147)

## 2013-11-20 LAB — CBC
HEMATOCRIT: 34.8 % — AB (ref 36.0–46.0)
Hemoglobin: 11.6 g/dL — ABNORMAL LOW (ref 12.0–15.0)
MCH: 28 pg (ref 26.0–34.0)
MCHC: 33.3 g/dL (ref 30.0–36.0)
MCV: 84.1 fL (ref 78.0–100.0)
PLATELETS: 254 10*3/uL (ref 150–400)
RBC: 4.14 MIL/uL (ref 3.87–5.11)
RDW: 12.8 % (ref 11.5–15.5)
WBC: 7.8 10*3/uL (ref 4.0–10.5)

## 2013-11-20 LAB — GLUCOSE, CAPILLARY
GLUCOSE-CAPILLARY: 338 mg/dL — AB (ref 70–99)
GLUCOSE-CAPILLARY: 272 mg/dL — AB (ref 70–99)
Glucose-Capillary: 252 mg/dL — ABNORMAL HIGH (ref 70–99)
Glucose-Capillary: 305 mg/dL — ABNORMAL HIGH (ref 70–99)

## 2013-11-20 MED ORDER — INSULIN GLARGINE 100 UNIT/ML ~~LOC~~ SOLN
23.0000 [IU] | Freq: Every day | SUBCUTANEOUS | Status: DC
Start: 2013-11-21 — End: 2013-11-21
  Administered 2013-11-21: 23 [IU] via SUBCUTANEOUS
  Filled 2013-11-20: qty 0.23

## 2013-11-20 MED ORDER — INSULIN ASPART 100 UNIT/ML ~~LOC~~ SOLN
2.0000 [IU] | Freq: Three times a day (TID) | SUBCUTANEOUS | Status: DC
  Administered 2013-11-20 – 2013-11-21 (×3): 2 [IU] via SUBCUTANEOUS

## 2013-11-20 NOTE — Progress Notes (Signed)
PROGRESS NOTE  [name] FBP:[id number] DOB: 1919-12-28 DOA: 11/18/2013 PCP: Jani Gravel, MD  Assessment/Plan: Right foot cellulitis  -Particularly in the great toe  -X-ray of the right foot showed no osteomyelitis  -Patient currently on Zosyn and vancomycin  -11/20/13--order MRI foot  -Patient currently afebrile with no leukocytosis.  Essential hypertension  -Stable, continue home medications  Urinary tract infection  -Continue antibiotics  -Patient denies dysuria  -Urine culture--75K, multiple morphotypes Hypothyroidism  -TSH 0.502  -Continue Synthroid  Atrial flutter  -Currently in sinus, continue diltiazem and carvedilol  Chronic hyponatremia  -Patient's baseline appears to be 12, currently 131  -Will continue to monitor his BMP  Chronic kidney disease, stage III  -Creatinine appears to be at baseline--1.1-1.4 -Vancomycin renally dosed  GERD  -Continue famotidine  Chronic diastolic heart failure  -Appears to be compensated and euvolemic  -Echocardiogram January 2014 2008 8242%, grade 2 diastolic dysfunction  -Continue daily weights, monitor intake and output  -Continue Lasix, Coreg  Diabetes mellitus, type I with neurological manifestations  -Continue Lantus, insulin sliding-scale CBG monitoring  -Continue gabapentin for neuropathy  -Hemoglobin A1c 9.0 (unfortuantely appears to be her baseline)  -Increase Lantus to 23 units daily -Add NovoLog 2 units with meals Chronic diarrhea/irritable bowel syndrome  -Stable, continue florastor  History CVA  -Patient has residual dysarthria  -Appears to be stable at baseline  -Continue aspirin for secondary prevention   Family Communication:   Pt at beside Disposition Plan:   Home when medically stable      Procedures/Studies: Dg Foot 2 Views Right  11/18/2013   CLINICAL DATA:  Patient with right great toe redness and superficial sores. Additional less severe similar areas along a other toes. History  of amputation of the right fourth toe 1 and half years ago. History of diabetes. Current symptoms for 1 and half weeks.  EXAM: RIGHT FOOT - 2 VIEW  COMPARISON:  Right foot MRI, 04/12/2012 and right foot radiographs, 04/11/2012.  FINDINGS: There is soft tissue swelling and evidence of distal superficial ulceration of the great toe. There are no areas of bone resorption to suggest osteomyelitis. There is no acute fracture. Joints are normally aligned. There is an old healed fracture of the third metatarsal. Fourth toe and distal fourth metatarsal have been resected. Resected margin is smooth.  Bones are diffusely demineralized.  IMPRESSION: 1. No fracture or dislocation. 2. No radiographic evidence of osteomyelitis.   Electronically Signed   By: Lajean Manes M.D.   On: 11/18/2013 07:54         Subjective: Patient denies fevers, chills, chest pain, shortness breath, vomiting, diarrhea, abdominal pain. She has some pain in the right foot. She has intermittent nausea.  Objective: Filed Vitals:   11/19/13 0525 11/19/13 1431 11/19/13 2112 11/20/13 0531  BP: 147/57 151/56 165/51 157/41  Pulse: 72 68 67 65  Temp: 98.4 F (36.9 C) 97.7 F (36.5 C) 98.2 F (36.8 C) 98.3 F (36.8 C)  TempSrc: Oral Axillary Oral Oral  Resp: 18 18 18 18   Height:      Weight:      SpO2: 96% 95% 95% 94%    Intake/Output Summary (Last 24 hours) at 11/20/13 1153 Last data filed at 11/20/13 1000  Gross per 24 hour  Intake    870 ml  Output      0 ml  Net    870 ml   Weight change:  Exam:   General:  [name] alert, follows commands appropriately, not in acute distress  HEENT: No icterus, No thrush,  [place]/AT  Cardiovascular: RRR, S1/S2, no rubs, no gallops  Respiratory: CTA bilaterally, no wheezing, no crackles, no rhonchi  Abdomen: Soft/+BS, non tender, non distended, no guarding  Extremities: Right great toe with ulceration on the medial aspect of the distal phalanx without purulent drainage. There is  erythema streaking to the metatarsal phalangeal joint area. No crepitance  Data Reviewed: Basic Metabolic Panel:  Recent Labs Lab 11/18/13 0810 11/18/13 1523 11/20/13 0439  NA 135* 131* 133*  K 4.9 4.6 4.0  CL 95* 94* 96  CO2 27 26 25   GLUCOSE 228* 351* 250*  BUN 27* 22 22  CREATININE 1.27* 1.12* 1.39*  CALCIUM 9.7 8.9 8.5  MG 1.6  --   --    Liver Function Tests:  Recent Labs Lab 11/18/13 1523  AST 10  ALT 7  ALKPHOS 58  BILITOT 0.3  PROT 7.0  ALBUMIN 3.0*   No results found for this basename: LIPASE, AMYLASE,  in the last 168 hours No results found for this basename: AMMONIA,  in the last 168 hours CBC:  Recent Labs Lab 11/18/13 0810 11/20/13 0439  WBC 9.2 7.8  NEUTROABS 6.6  --   HGB 12.8 11.6*  HCT 39.3 34.8*  MCV 84.3 84.1  PLT 311 254   Cardiac Enzymes: No results found for this basename: CKTOTAL, CKMB, CKMBINDEX, TROPONINI,  in the last 168 hours BNP: No components found with this basename: POCBNP,  CBG:  Recent Labs Lab 11/19/13 0736 11/19/13 1130 11/19/13 1645 11/19/13 2115 11/20/13 0735  GLUCAP 138* 290* 262* 285* 252*    Recent Results (from the past 240 hour(s))  URINE CULTURE     Status: None   Collection Time    11/18/13  7:50 AM      Result Value Ref Range Status   Specimen Description URINE, CLEAN CATCH   Final   Special Requests NONE   Final   Culture  Setup Time     Final   Value: 11/18/2013 09:52     Performed at [place]     Final   Value: 75,000 COLONIES/ML     Performed at [facility]   Culture     Final   Value: Multiple bacterial morphotypes present, none predominant. Suggest appropriate recollection if clinically indicated.     Performed at [facility]   Report Status 11/19/2013 FINAL   Final  WOUND CULTURE     Status: None   Collection Time    11/19/13 10:46 AM      Result Value Ref Range Status   Specimen Description TOE RIGHT GREAT   Final   Special Requests  Immunocompromised   Final   Gram Stain     Final   Value: NO WBC SEEN     NO SQUAMOUS EPITHELIAL CELLS SEEN     NO ORGANISMS SEEN     Performed at [facility]   Culture     Final   Value: Culture reincubated for better growth     Performed at [facility]   Report Status PENDING   Incomplete  MRSA PCR SCREENING     Status: None   Collection Time    11/19/13 10:46 AM      Result Value Ref Range Status   MRSA by PCR NEGATIVE  NEGATIVE Final   Comment:  The GeneXpert MRSA Assay (FDA     approved for NASAL specimens     only), is one component of a     comprehensive MRSA colonization     surveillance program. It is not     intended to diagnose MRSA     infection nor to guide or     monitor treatment for     MRSA infections.     Scheduled Meds: . aspirin  81 mg Oral Daily  . carvedilol  6.25 mg Oral BID WC  . diltiazem  180 mg Oral QHS  . famotidine  20 mg Oral Daily  . furosemide  20 mg Oral Daily  . gabapentin  100 mg Oral BID  . heparin  5,000 Units Subcutaneous 3 times per day  . insulin aspart  0-9 Units Subcutaneous TID WC  . insulin glargine  18 Units Subcutaneous Daily  . isosorbide mononitrate  30 mg Oral Daily  . levothyroxine  175 mcg Oral QAC breakfast  . piperacillin-tazobactam (ZOSYN)  IV  3.375 g Intravenous 3 times per day  . saccharomyces boulardii  250 mg Oral BID  . sodium chloride  3 mL Intravenous Q12H  . tamsulosin  0.4 mg Oral q morning - 10a  . vancomycin  750 mg Intravenous Q24H   Continuous Infusions:    Shariah Assad, DO  [facility] Pager [phone]  If 7PM-7AM, please contact night-coverage [email or web address] Password [email or web address] 11/20/2013, 11:53 AM   LOS: 2 days

## 2013-11-20 NOTE — Progress Notes (Addendum)
Patient active with [facility]. She was recently discharged from SNF. She lives alone but has supportive family and has hired assistance. Spoke with patient at bedside to discuss that [facility] will continue to follow. Made inpatient RNCM aware. Marthenia Rolling, MSN-RN,BSN- [facility] [phone]

## 2013-11-20 NOTE — Plan of Care (Signed)
Problem: Phase I Progression Outcomes Goal: Pain controlled with appropriate interventions Outcome: Not Applicable Date Met:  79/02/40 Patient denies pain. Goal: Wound assessment- dressing change as appropriate Outcome: Completed/Met Date Met:  11/20/13 Wound dressing changed BID per [facility] RN order.

## 2013-11-21 LAB — BASIC METABOLIC PANEL
Anion gap: 13 (ref 5–15)
BUN: 20 mg/dL (ref 6–23)
CALCIUM: 8.6 mg/dL (ref 8.4–10.5)
CO2: 25 meq/L (ref 19–32)
Chloride: 97 mEq/L (ref 96–112)
Creatinine, Ser: 1.56 mg/dL — ABNORMAL HIGH (ref 0.50–1.10)
GFR calc Af Amer: 32 mL/min — ABNORMAL LOW (ref >90)
GFR calc non Af Amer: 28 mL/min — ABNORMAL LOW (ref >90)
GLUCOSE: 188 mg/dL — AB (ref 70–99)
POTASSIUM: 4.1 meq/L (ref 3.7–5.3)
Sodium: 135 mEq/L — ABNORMAL LOW (ref 137–147)

## 2013-11-21 LAB — C-REACTIVE PROTEIN

## 2013-11-21 LAB — GLUCOSE, CAPILLARY
Glucose-Capillary: 191 mg/dL — ABNORMAL HIGH (ref 70–99)
Glucose-Capillary: 330 mg/dL — ABNORMAL HIGH (ref 70–99)

## 2013-11-21 LAB — SEDIMENTATION RATE: SED RATE: 30 mm/h — AB (ref 0–22)

## 2013-11-21 MED ORDER — DOXYCYCLINE HYCLATE 100 MG PO CAPS: 100.0000 mg | ORAL_CAPSULE | Freq: Two times a day (BID) | ORAL | Status: DC

## 2013-11-21 MED ORDER — LEVOFLOXACIN 750 MG PO TABS: 750.0000 mg | ORAL_TABLET | ORAL | Status: DC

## 2013-11-21 NOTE — Discharge Summary (Signed)
Physician Discharge Summary  [name] YHC:[id number] DOB: [birth year] DOA: 11/18/2013  PCP: [name] Gravel, MD  Admit date: 11/18/2013 Discharge date: 11/21/2013  Recommendations for Outpatient Follow-up:  1. Pt will need to follow up with PCP in 2 weeks post discharge 2. Please obtain BMP and CBC in one week  Discharge Diagnoses:  Right foot cellulitis  -Particularly in the great toe  -X-ray of the right foot showed no osteomyelitis  -Patient currently on Zosyn and vancomycin-->clinically improving -11/20/13--MRI foot--negative for osteomyelitis -Patient currently afebrile with no leukocytosis.  -Patient will go home with levofloxacin 750 mg every 48 hours and doxycycline 100 mg twice a day for 7 additional days which will complete 10 days of therapy - Home health was set up to assist with dressing changes Essential hypertension  -Stable, continue home medications  Urinary tract infection  -Continue antibiotics  -Patient denies dysuria  -Urine culture--75K, multiple morphotypes  -patient will finish antibiotics as discussed above Hypothyroidism  -TSH 0.502  -Continue Synthroid  Atrial flutter  -Currently in sinus, continue diltiazem and carvedilol  Chronic hyponatremia  -Patient's baseline appears to be 12, currently 131  -Will continue to monitor his BMP  Chronic kidney disease, stage III  -Creatinine appears to be at baseline--1.1-1.4  -Vancomycin renally dosed  GERD  -Continue famotidine  Chronic diastolic heart failure  -Appears to be compensated and euvolemic  -Echocardiogram January 2014 2008 5176%, grade 2 diastolic dysfunction  -Continue daily weights, monitor intake and output  -Continue Lasix, Coreg  Diabetes mellitus, type I with neurological manifestations  -Continue Lantus, insulin sliding-scale CBG monitoring  -Continue gabapentin for neuropathy  -Hemoglobin A1c 9.0 (unfortuantely appears to be her baseline)  -patient will go home on her home dose of  Lantus 28 units daily with NovoLog 5 units with meals Chronic diarrhea/irritable bowel syndrome  -Stable, continue florastor  History CVA  -Patient has residual dysarthria  -Appears to be stable at baseline  -Continue aspirin for secondary prevention  Discharge Condition: stable   Disposition:  home   Diet: Carbohydrate modified  Wt Readings from Last 3 Encounters:  11/18/13 74.617 kg (164 lb 8 oz)  10/02/13 75.66 kg (166 lb 12.8 oz)  09/12/13 75.569 kg (166 lb 9.6 oz)    History of present illness:  HPI: [name] is a 80 y.o. female with pmh significant for HTN, CAD, atrial flutter, DM type 1 (on insulin), diabetic neuropathy, hx of stroke with residual dysarthria and atrial flutter; presented to ED complaining of right foot erythema and swelling. Patient reported development of a blister in her right big toe, which subsequently busted and now has swelling and redness of her toe. Erythema is now spreading into foot and lower part of her leg. The patient was started on empiric vancomycin and Zosyn. She had a good clinical response. The patient remained afebrile and hemodynamically stable. The patient was seen by wound care and wound care was set up for home health. Physical therapy recommended home health physical therapy which was set up with assistance of care management. Patient will go home with doxycycline and levofloxacin for 7 additional days which will complete 10 days of therapy.     Discharge Exam: Filed Vitals:   11/21/13 0633  BP: 163/60  Pulse:   Temp: 98.1 F (36.7 C)  Resp: 20   Filed Vitals:   11/20/13 0531 11/20/13 1431 11/20/13 2125 11/21/13 0633  BP: 157/41 136/51 126/44 163/60  Pulse: 65 67 63   Temp: 98.3 F (36.8  C) 98.2 F (36.8 C) 98.4 F (36.9 C) 98.1 F (36.7 C)  TempSrc: Oral Oral Oral Oral  Resp: 18 18 18 20   Height:      Weight:      SpO2: 94% 97% 97% 97%   General: A&O x 3, NAD, pleasant, cooperative Cardiovascular: RRR, no rub,  no gallop, no S3 Respiratory: CTAB, no wheeze, no rhonchi Abdomen:soft, nontender, nondistended, positive bowel sounds Extremities: trace LE edema, No lymphangitis, no petechiae; right great toe without any erythema, crepitance, necrosis, lymphangitis.  Discharge Instructions      Discharge Instructions   Diet - low sodium heart healthy    Complete by:  As directed      Increase activity slowly    Complete by:  As directed             Medication List         aspirin 81 MG tablet  Take 81 mg by mouth daily.     carvedilol 6.25 MG tablet  Commonly known as:  COREG  Take 6.25 mg by mouth 2 (two) times daily with a meal.     diltiazem 180 MG 24 hr capsule  Commonly known as:  CARDIZEM CD  Take 180 mg by mouth at bedtime.     doxycycline 100 MG capsule  Commonly known as:  VIBRAMYCIN  Take 1 capsule (100 mg total) by mouth 2 (two) times daily.     furosemide 20 MG tablet  Commonly known as:  LASIX  Take 20 mg by mouth daily.     gabapentin 300 MG capsule  Commonly known as:  NEURONTIN  Take 300 mg by mouth 2 (two) times daily.     insulin aspart 100 UNIT/ML injection  Commonly known as:  novoLOG  Inject 5 Units into the skin 3 (three) times daily before meals.     insulin glargine 100 UNIT/ML injection  Commonly known as:  LANTUS  Inject 28 Units into the skin every morning.     isosorbide mononitrate 30 MG 24 hr tablet  Commonly known as:  IMDUR  Take 30 mg by mouth daily.     levofloxacin 750 MG tablet  Commonly known as:  LEVAQUIN  Take 1 tablet (750 mg total) by mouth every other day.     levothyroxine 175 MCG tablet  Commonly known as:  SYNTHROID, LEVOTHROID  Take 175 mcg by mouth daily before breakfast.     tamsulosin 0.4 MG Caps capsule  Commonly known as:  FLOMAX  Take 0.4 mg by mouth every morning.     VITAMIN D PO  Take 1 tablet by mouth daily.     VITAMIN E PO  Take 1 capsule by mouth daily.         The results of significant  diagnostics from this hospitalization (including imaging, microbiology, ancillary and laboratory) are listed below for reference.    Significant Diagnostic Studies: Mr Foot Right Wo Contrast  11/21/2013   CLINICAL DATA:  Erythema and ulceration of the great toe. History of fourth toe amputation 1.5 years ago and diabetes. Chronic kidney disease. Initial encounter.  EXAM: MRI OF THE RIGHT FOREFOOT WITHOUT CONTRAST  TECHNIQUE: Multiplanar, multisequence MR imaging was performed. No intravenous contrast was administered.  COMPARISON:  Radiographs 11/18/2013 and 04/11/2012.  MRI 04/12/2012.  FINDINGS: There is mild soft tissue edema within the great toe, but no evidence of focal fluid collection. There is a small effusion of the first metatarsal phalangeal joint associated with progressive degenerative  changes. There is no evidence of bone destruction or marrow edema.  Patient has undergone amputation of the fourth ray through the distal metatarsal. The amputation site is sharp. There is an old healed fracture of the third metatarsal and chronic flattening of the second metatarsal head. The fifth metatarsal and additional toes appear normal. The alignment is normal at the Lisfranc joint. There are mild midfoot degenerative changes.  The forefoot muscles are diffusely atrophied.  IMPRESSION: 1. No evidence of right forefoot osteomyelitis. 2. Progressive degenerative changes at the first metatarsal phalangeal joint. 3. Prior amputation of the fourth ray with chronic fracture of the third metatarsal.   Electronically Signed   By: Camie Patience M.D.   On: 11/21/2013 08:48   Dg Foot 2 Views Right  11/18/2013   CLINICAL DATA:  Patient with right great toe redness and superficial sores. Additional less severe similar areas along a other toes. History of amputation of the right fourth toe 1 and half years ago. History of diabetes. Current symptoms for 1 and half weeks.  EXAM: RIGHT FOOT - 2 VIEW  COMPARISON:  Right foot  MRI, 04/12/2012 and right foot radiographs, 04/11/2012.  FINDINGS: There is soft tissue swelling and evidence of distal superficial ulceration of the great toe. There are no areas of bone resorption to suggest osteomyelitis. There is no acute fracture. Joints are normally aligned. There is an old healed fracture of the third metatarsal. Fourth toe and distal fourth metatarsal have been resected. Resected margin is smooth.  Bones are diffusely demineralized.  IMPRESSION: 1. No fracture or dislocation. 2. No radiographic evidence of osteomyelitis.   Electronically Signed   By: Lajean Manes M.D.   On: 11/18/2013 07:54     Microbiology: Recent Results (from the past 240 hour(s))  URINE CULTURE     Status: None   Collection Time    11/18/13  7:50 AM      Result Value Ref Range Status   Specimen Description URINE, CLEAN CATCH   Final   Special Requests NONE   Final   Culture  Setup Time     Final   Value: 11/18/2013 09:52     Performed at [place]     Final   Value: 75,000 COLONIES/ML     Performed at [facility]   Culture     Final   Value: Multiple bacterial morphotypes present, none predominant. Suggest appropriate recollection if clinically indicated.     Performed at [facility]   Report Status 11/19/2013 FINAL   Final  WOUND CULTURE     Status: None   Collection Time    11/19/13 10:46 AM      Result Value Ref Range Status   Specimen Description TOE RIGHT GREAT   Final   Special Requests Immunocompromised   Final   Gram Stain     Final   Value: NO WBC SEEN     NO SQUAMOUS EPITHELIAL CELLS SEEN     NO ORGANISMS SEEN     Performed at [facility]   Culture     Final   Value: MODERATE STAPHYLOCOCCUS AUREUS     Note: RIFAMPIN AND GENTAMICIN SHOULD NOT BE USED AS SINGLE DRUGS FOR TREATMENT OF STAPH INFECTIONS.     Performed at [facility]   Report Status PENDING   Incomplete  MRSA PCR SCREENING     Status: None    Collection Time    11/19/13 10:46  AM      Result Value Ref Range Status   MRSA by PCR NEGATIVE  NEGATIVE Final   Comment:            The GeneXpert MRSA Assay (FDA     approved for NASAL specimens     only), is one component of a     comprehensive MRSA colonization     surveillance program. It is not     intended to diagnose MRSA     infection nor to guide or     monitor treatment for     MRSA infections.     Labs: Basic Metabolic Panel:  Recent Labs Lab 11/18/13 0810 11/18/13 1523 11/20/13 0439 11/21/13 0530  NA 135* 131* 133* 135*  K 4.9 4.6 4.0 4.1  CL 95* 94* 96 97  CO2 27 26 25 25   GLUCOSE 228* 351* 250* 188*  BUN 27* 22 22 20   CREATININE 1.27* 1.12* 1.39* 1.56*  CALCIUM 9.7 8.9 8.5 8.6  MG 1.6  --   --   --    Liver Function Tests:  Recent Labs Lab 11/18/13 1523  AST 10  ALT 7  ALKPHOS 58  BILITOT 0.3  PROT 7.0  ALBUMIN 3.0*   No results found for this basename: LIPASE, AMYLASE,  in the last 168 hours No results found for this basename: AMMONIA,  in the last 168 hours CBC:  Recent Labs Lab 11/18/13 0810 11/20/13 0439  WBC 9.2 7.8  NEUTROABS 6.6  --   HGB 12.8 11.6*  HCT 39.3 34.8*  MCV 84.3 84.1  PLT 311 254   Cardiac Enzymes: No results found for this basename: CKTOTAL, CKMB, CKMBINDEX, TROPONINI,  in the last 168 hours BNP: No components found with this basename: POCBNP,  CBG:  Recent Labs Lab 11/20/13 0735 11/20/13 1208 11/20/13 1653 11/20/13 2128 11/21/13 0733  GLUCAP 252* 338* 305* 272* 191*    Time coordinating discharge:  Greater than 30 minutes  Signed:  Diondre Pulis, DO [facility] Pager: [phone] 11/21/2013, 11:31 AM

## 2013-11-21 NOTE — Care Management Note (Addendum)
    Page 1 of 2   11/21/2013     12:26:43 PM CARE MANAGEMENT NOTE 11/21/2013  Patient:  DARINA, HARTWELL   Account Number:  [id number]  Date Initiated:  11/21/2013  Documentation initiated by:  [facility] [place]  Subjective/Objective Assessment:   adm: Right foot cellulitis     Action/Plan:   discharge planning   Anticipated DC Date:  11/21/2013   Anticipated DC Plan:  [place]  CM consult      [facility] Choice  HOME HEALTH   Choice offered to / List presented to:  C-1 Patient   DME arranged  NA      DME agency  NA     [address] arranged  HH-1 RN  [place].   Status of service:  Completed, signed off Medicare Important Message given?   (If response is "NO", the following Medicare IM given date fields will be blank) Date Medicare IM given:   Medicare IM given by:   Date Additional Medicare IM given:   Additional Medicare IM given by:    Discharge Disposition:  [place]  Per UR Regulation:    If discussed at Long Length of Stay Meetings, dates discussed:    Comments:  11/21/13 11:20 CM met with pt in room to offer choice of home health agency.  Pt chooses [facility] to render HHPT/RN. Address and contact information verified with pt.  Pt will have daughter,  [name] Daughter [phone] staying with pt at [place], [place].  No DME needed.  Referral called to [facility] rep, Kristen with address update and contact information of daughter.  . No other CM needs were communicated.  Mariane Masters, BSN, CM [phone].

## 2013-11-21 NOTE — Plan of Care (Signed)
Problem: Phase III Progression Outcomes Goal: Wound care performed by pt/family Outcome: Not Applicable Date Met:  37/04/88 Patient will have [facility] for wound care.

## 2013-11-21 NOTE — Progress Notes (Signed)
Discharge instructions reviewed with patient and her daughter using teach back method and they demonstrated understanding.  Patient stable for discharge home.  Assessment unchanged from this am.  Home health arranged with [facility].  Patient understands follow up instructions.  Zandra Abts [facility]  11/21/2013

## 2013-11-22 LAB — WOUND CULTURE: Gram Stain: NONE SEEN

## 2014-01-03 ENCOUNTER — Encounter (HOSPITAL_BASED_OUTPATIENT_CLINIC_OR_DEPARTMENT_OTHER): Payer: Medicare Other | Attending: Internal Medicine

## 2014-01-03 DIAGNOSIS — I2510 Atherosclerotic heart disease of native coronary artery without angina pectoris: Secondary | ICD-10-CM | POA: Insufficient documentation

## 2014-01-03 DIAGNOSIS — L97519 Non-pressure chronic ulcer of other part of right foot with unspecified severity: Secondary | ICD-10-CM | POA: Insufficient documentation

## 2014-01-03 DIAGNOSIS — E1140 Type 2 diabetes mellitus with diabetic neuropathy, unspecified: Secondary | ICD-10-CM | POA: Insufficient documentation

## 2014-01-03 DIAGNOSIS — Z794 Long term (current) use of insulin: Secondary | ICD-10-CM | POA: Insufficient documentation

## 2014-01-03 DIAGNOSIS — Z7982 Long term (current) use of aspirin: Secondary | ICD-10-CM | POA: Insufficient documentation

## 2014-01-03 DIAGNOSIS — I10 Essential (primary) hypertension: Secondary | ICD-10-CM | POA: Insufficient documentation

## 2014-01-03 DIAGNOSIS — I69322 Dysarthria following cerebral infarction: Secondary | ICD-10-CM | POA: Insufficient documentation

## 2014-01-03 DIAGNOSIS — E11621 Type 2 diabetes mellitus with foot ulcer: Secondary | ICD-10-CM | POA: Insufficient documentation

## 2014-01-03 DIAGNOSIS — I4892 Unspecified atrial flutter: Secondary | ICD-10-CM | POA: Insufficient documentation

## 2014-01-09 DIAGNOSIS — E11621 Type 2 diabetes mellitus with foot ulcer: Secondary | ICD-10-CM | POA: Diagnosis not present

## 2014-01-09 DIAGNOSIS — Z7982 Long term (current) use of aspirin: Secondary | ICD-10-CM | POA: Diagnosis not present

## 2014-01-09 DIAGNOSIS — I10 Essential (primary) hypertension: Secondary | ICD-10-CM | POA: Diagnosis not present

## 2014-01-09 DIAGNOSIS — E1140 Type 2 diabetes mellitus with diabetic neuropathy, unspecified: Secondary | ICD-10-CM | POA: Diagnosis not present

## 2014-01-09 DIAGNOSIS — L97519 Non-pressure chronic ulcer of other part of right foot with unspecified severity: Secondary | ICD-10-CM | POA: Diagnosis not present

## 2014-01-09 DIAGNOSIS — Z794 Long term (current) use of insulin: Secondary | ICD-10-CM | POA: Diagnosis not present

## 2014-01-09 DIAGNOSIS — I4892 Unspecified atrial flutter: Secondary | ICD-10-CM | POA: Diagnosis not present

## 2014-01-09 DIAGNOSIS — I69322 Dysarthria following cerebral infarction: Secondary | ICD-10-CM | POA: Diagnosis not present

## 2014-01-09 DIAGNOSIS — I2510 Atherosclerotic heart disease of native coronary artery without angina pectoris: Secondary | ICD-10-CM | POA: Diagnosis not present

## 2014-01-10 NOTE — Progress Notes (Signed)
[facility]  NAME:  [name], [name] NO.:  [id number]  MEDICAL RECORD NO.:  [id number]      DATE OF BIRTH:  [birth year]  PHYSICIAN:  Judene Companion, M.D.           VISIT DATE:                                  OFFICE VISIT   This is a 80 year old lady who has a long history of many health issues including atrial flutter, hypertension, coronary artery disease, type 1 diabetes, although she is on insulin, diabetic neuropathy, past history of a mild CVA with some residual dysarthria.  She came back to the Wound Clinic with a diabetic ulcer on her right great toe.  This is on the plantar aspect and the largest one is over the MP joint and she has a smaller ulcer about 5 or 6 mm on the side of the toe itself.  This lady is already on Cipro and doxycycline when she was seen by her doctor. She also takes aspirin, carvedilol, Cardizem, Lasix, insulin, she takes 5 units 3 times a day, and glargine insulin 28 units every morning.  She is also on Synthroid 175 mcg.  She also takes vitamins.  I debrided the callus from this ulcer and scraped little of the biofilm off and we will start treating her with collagen dressings daily and we put felt around these ulcers in order to offload.  I would classify her as diabetic ulcers, right hallux, Wagner 2 in depth.  OTHER DIAGNOSES:  Hypertension, coronary artery disease, history of cerebrovascular accident, and diabetes.     Judene Companion, M.D.     PP/MEDQ  D:  01/09/2014  T:  01/10/2014  Job:  [id number]

## 2014-01-16 DIAGNOSIS — L97519 Non-pressure chronic ulcer of other part of right foot with unspecified severity: Secondary | ICD-10-CM | POA: Diagnosis not present

## 2014-01-16 DIAGNOSIS — I4892 Unspecified atrial flutter: Secondary | ICD-10-CM | POA: Diagnosis not present

## 2014-01-16 DIAGNOSIS — E11621 Type 2 diabetes mellitus with foot ulcer: Secondary | ICD-10-CM | POA: Diagnosis not present

## 2014-01-16 DIAGNOSIS — E1140 Type 2 diabetes mellitus with diabetic neuropathy, unspecified: Secondary | ICD-10-CM | POA: Diagnosis not present

## 2014-01-23 DIAGNOSIS — L97519 Non-pressure chronic ulcer of other part of right foot with unspecified severity: Secondary | ICD-10-CM | POA: Diagnosis not present

## 2014-01-23 DIAGNOSIS — E11621 Type 2 diabetes mellitus with foot ulcer: Secondary | ICD-10-CM | POA: Diagnosis not present

## 2014-01-23 DIAGNOSIS — E1140 Type 2 diabetes mellitus with diabetic neuropathy, unspecified: Secondary | ICD-10-CM | POA: Diagnosis not present

## 2014-01-23 DIAGNOSIS — I4892 Unspecified atrial flutter: Secondary | ICD-10-CM | POA: Diagnosis not present

## 2014-01-30 ENCOUNTER — Encounter (HOSPITAL_BASED_OUTPATIENT_CLINIC_OR_DEPARTMENT_OTHER): Payer: PPO | Attending: General Surgery

## 2014-01-30 DIAGNOSIS — L97519 Non-pressure chronic ulcer of other part of right foot with unspecified severity: Secondary | ICD-10-CM | POA: Diagnosis not present

## 2014-01-30 DIAGNOSIS — E11621 Type 2 diabetes mellitus with foot ulcer: Secondary | ICD-10-CM | POA: Insufficient documentation

## 2014-02-06 ENCOUNTER — Other Ambulatory Visit (HOSPITAL_COMMUNITY): Payer: Self-pay | Admitting: General Surgery

## 2014-02-06 ENCOUNTER — Ambulatory Visit (HOSPITAL_COMMUNITY)
Admission: RE | Admit: 2014-02-06 | Discharge: 2014-02-06 | Disposition: A | Discharge status: Home / Self Care | Payer: PPO | Source: Ambulatory Visit | Attending: General Surgery | Admitting: General Surgery

## 2014-02-06 DIAGNOSIS — E11621 Type 2 diabetes mellitus with foot ulcer: Secondary | ICD-10-CM | POA: Diagnosis not present

## 2014-02-06 DIAGNOSIS — M869 Osteomyelitis, unspecified: Secondary | ICD-10-CM

## 2014-02-06 DIAGNOSIS — S91101A Unspecified open wound of right great toe without damage to nail, initial encounter: Secondary | ICD-10-CM | POA: Insufficient documentation

## 2014-02-06 DIAGNOSIS — L97519 Non-pressure chronic ulcer of other part of right foot with unspecified severity: Secondary | ICD-10-CM | POA: Diagnosis not present

## 2014-02-12 ENCOUNTER — Encounter: Payer: Self-pay | Admitting: *Deleted

## 2014-02-13 DIAGNOSIS — E11621 Type 2 diabetes mellitus with foot ulcer: Secondary | ICD-10-CM | POA: Diagnosis not present

## 2014-02-20 ENCOUNTER — Ambulatory Visit: Payer: Medicare Other | Admitting: Cardiovascular Disease

## 2014-02-26 ENCOUNTER — Encounter: Payer: Self-pay | Admitting: Cardiovascular Disease

## 2014-02-27 ENCOUNTER — Encounter: Payer: Self-pay | Admitting: Cardiovascular Disease

## 2014-03-01 ENCOUNTER — Encounter (HOSPITAL_BASED_OUTPATIENT_CLINIC_OR_DEPARTMENT_OTHER): Payer: PPO | Attending: Internal Medicine

## 2014-03-01 DIAGNOSIS — E11621 Type 2 diabetes mellitus with foot ulcer: Secondary | ICD-10-CM | POA: Diagnosis not present

## 2014-03-01 DIAGNOSIS — L97511 Non-pressure chronic ulcer of other part of right foot limited to breakdown of skin: Secondary | ICD-10-CM | POA: Diagnosis not present

## 2014-03-08 DIAGNOSIS — E11621 Type 2 diabetes mellitus with foot ulcer: Secondary | ICD-10-CM | POA: Diagnosis not present

## 2014-03-08 DIAGNOSIS — L97511 Non-pressure chronic ulcer of other part of right foot limited to breakdown of skin: Secondary | ICD-10-CM | POA: Diagnosis not present

## 2014-03-14 DIAGNOSIS — E11621 Type 2 diabetes mellitus with foot ulcer: Secondary | ICD-10-CM | POA: Diagnosis not present

## 2014-03-14 DIAGNOSIS — L97511 Non-pressure chronic ulcer of other part of right foot limited to breakdown of skin: Secondary | ICD-10-CM | POA: Diagnosis not present

## 2014-03-28 ENCOUNTER — Encounter (HOSPITAL_BASED_OUTPATIENT_CLINIC_OR_DEPARTMENT_OTHER): Payer: PPO | Attending: Internal Medicine

## 2014-03-28 DIAGNOSIS — E11621 Type 2 diabetes mellitus with foot ulcer: Secondary | ICD-10-CM | POA: Diagnosis present

## 2014-03-28 DIAGNOSIS — L97511 Non-pressure chronic ulcer of other part of right foot limited to breakdown of skin: Secondary | ICD-10-CM | POA: Diagnosis not present

## 2014-04-11 DIAGNOSIS — L97511 Non-pressure chronic ulcer of other part of right foot limited to breakdown of skin: Secondary | ICD-10-CM | POA: Diagnosis not present

## 2014-04-11 DIAGNOSIS — E11621 Type 2 diabetes mellitus with foot ulcer: Secondary | ICD-10-CM | POA: Diagnosis not present

## 2014-04-15 ENCOUNTER — Other Ambulatory Visit: Payer: Self-pay | Admitting: Cardiovascular Disease

## 2014-04-15 NOTE — Telephone Encounter (Signed)
Rx has been sent to the pharmacy electronically. ° °

## 2014-04-25 DIAGNOSIS — E11621 Type 2 diabetes mellitus with foot ulcer: Secondary | ICD-10-CM | POA: Diagnosis not present

## 2014-04-25 DIAGNOSIS — L97511 Non-pressure chronic ulcer of other part of right foot limited to breakdown of skin: Secondary | ICD-10-CM | POA: Diagnosis not present

## 2014-05-03 ENCOUNTER — Encounter (HOSPITAL_BASED_OUTPATIENT_CLINIC_OR_DEPARTMENT_OTHER): Payer: PPO | Attending: Internal Medicine

## 2014-05-03 DIAGNOSIS — E11621 Type 2 diabetes mellitus with foot ulcer: Secondary | ICD-10-CM | POA: Diagnosis present

## 2014-05-03 DIAGNOSIS — Z794 Long term (current) use of insulin: Secondary | ICD-10-CM | POA: Insufficient documentation

## 2014-05-03 DIAGNOSIS — L97411 Non-pressure chronic ulcer of right heel and midfoot limited to breakdown of skin: Secondary | ICD-10-CM | POA: Insufficient documentation

## 2014-05-03 DIAGNOSIS — L97521 Non-pressure chronic ulcer of other part of left foot limited to breakdown of skin: Secondary | ICD-10-CM | POA: Insufficient documentation

## 2014-05-03 DIAGNOSIS — L97511 Non-pressure chronic ulcer of other part of right foot limited to breakdown of skin: Secondary | ICD-10-CM | POA: Insufficient documentation

## 2014-05-03 DIAGNOSIS — Z993 Dependence on wheelchair: Secondary | ICD-10-CM | POA: Insufficient documentation

## 2014-05-10 DIAGNOSIS — L97521 Non-pressure chronic ulcer of other part of left foot limited to breakdown of skin: Secondary | ICD-10-CM | POA: Diagnosis not present

## 2014-05-10 DIAGNOSIS — L97511 Non-pressure chronic ulcer of other part of right foot limited to breakdown of skin: Secondary | ICD-10-CM | POA: Diagnosis not present

## 2014-05-10 DIAGNOSIS — L97411 Non-pressure chronic ulcer of right heel and midfoot limited to breakdown of skin: Secondary | ICD-10-CM | POA: Diagnosis not present

## 2014-05-10 DIAGNOSIS — E11621 Type 2 diabetes mellitus with foot ulcer: Secondary | ICD-10-CM | POA: Diagnosis not present

## 2014-05-17 DIAGNOSIS — L97511 Non-pressure chronic ulcer of other part of right foot limited to breakdown of skin: Secondary | ICD-10-CM | POA: Diagnosis not present

## 2014-05-17 DIAGNOSIS — L97411 Non-pressure chronic ulcer of right heel and midfoot limited to breakdown of skin: Secondary | ICD-10-CM | POA: Diagnosis not present

## 2014-05-17 DIAGNOSIS — E11621 Type 2 diabetes mellitus with foot ulcer: Secondary | ICD-10-CM | POA: Diagnosis not present

## 2014-05-17 DIAGNOSIS — L97521 Non-pressure chronic ulcer of other part of left foot limited to breakdown of skin: Secondary | ICD-10-CM | POA: Diagnosis not present

## 2014-05-23 DIAGNOSIS — L97521 Non-pressure chronic ulcer of other part of left foot limited to breakdown of skin: Secondary | ICD-10-CM | POA: Diagnosis not present

## 2014-05-23 DIAGNOSIS — E11621 Type 2 diabetes mellitus with foot ulcer: Secondary | ICD-10-CM | POA: Diagnosis not present

## 2014-05-23 DIAGNOSIS — L97511 Non-pressure chronic ulcer of other part of right foot limited to breakdown of skin: Secondary | ICD-10-CM | POA: Diagnosis not present

## 2014-05-23 DIAGNOSIS — L97411 Non-pressure chronic ulcer of right heel and midfoot limited to breakdown of skin: Secondary | ICD-10-CM | POA: Diagnosis not present

## 2014-05-30 ENCOUNTER — Encounter (HOSPITAL_BASED_OUTPATIENT_CLINIC_OR_DEPARTMENT_OTHER): Payer: PPO | Attending: Internal Medicine

## 2014-05-30 DIAGNOSIS — L03031 Cellulitis of right toe: Secondary | ICD-10-CM | POA: Diagnosis not present

## 2014-05-30 DIAGNOSIS — L97511 Non-pressure chronic ulcer of other part of right foot limited to breakdown of skin: Secondary | ICD-10-CM | POA: Insufficient documentation

## 2014-05-30 DIAGNOSIS — Z993 Dependence on wheelchair: Secondary | ICD-10-CM | POA: Insufficient documentation

## 2014-05-30 DIAGNOSIS — Z8614 Personal history of Methicillin resistant Staphylococcus aureus infection: Secondary | ICD-10-CM | POA: Diagnosis not present

## 2014-05-30 DIAGNOSIS — B9689 Other specified bacterial agents as the cause of diseases classified elsewhere: Secondary | ICD-10-CM | POA: Diagnosis not present

## 2014-05-30 DIAGNOSIS — Z794 Long term (current) use of insulin: Secondary | ICD-10-CM | POA: Insufficient documentation

## 2014-05-30 DIAGNOSIS — L97521 Non-pressure chronic ulcer of other part of left foot limited to breakdown of skin: Secondary | ICD-10-CM | POA: Insufficient documentation

## 2014-05-30 DIAGNOSIS — E11621 Type 2 diabetes mellitus with foot ulcer: Secondary | ICD-10-CM | POA: Diagnosis present

## 2014-05-30 DIAGNOSIS — L97411 Non-pressure chronic ulcer of right heel and midfoot limited to breakdown of skin: Secondary | ICD-10-CM | POA: Diagnosis not present

## 2014-06-03 DIAGNOSIS — E11621 Type 2 diabetes mellitus with foot ulcer: Secondary | ICD-10-CM | POA: Diagnosis not present

## 2014-06-06 DIAGNOSIS — L97411 Non-pressure chronic ulcer of right heel and midfoot limited to breakdown of skin: Secondary | ICD-10-CM | POA: Diagnosis not present

## 2014-06-06 DIAGNOSIS — L97521 Non-pressure chronic ulcer of other part of left foot limited to breakdown of skin: Secondary | ICD-10-CM | POA: Diagnosis not present

## 2014-06-06 DIAGNOSIS — E11621 Type 2 diabetes mellitus with foot ulcer: Secondary | ICD-10-CM | POA: Diagnosis not present

## 2014-06-06 DIAGNOSIS — L97511 Non-pressure chronic ulcer of other part of right foot limited to breakdown of skin: Secondary | ICD-10-CM | POA: Diagnosis not present

## 2014-06-13 DIAGNOSIS — L97521 Non-pressure chronic ulcer of other part of left foot limited to breakdown of skin: Secondary | ICD-10-CM | POA: Diagnosis not present

## 2014-06-13 DIAGNOSIS — E11621 Type 2 diabetes mellitus with foot ulcer: Secondary | ICD-10-CM | POA: Diagnosis not present

## 2014-06-13 DIAGNOSIS — L97511 Non-pressure chronic ulcer of other part of right foot limited to breakdown of skin: Secondary | ICD-10-CM | POA: Diagnosis not present

## 2014-06-13 DIAGNOSIS — L97411 Non-pressure chronic ulcer of right heel and midfoot limited to breakdown of skin: Secondary | ICD-10-CM | POA: Diagnosis not present

## 2014-06-20 DIAGNOSIS — L97411 Non-pressure chronic ulcer of right heel and midfoot limited to breakdown of skin: Secondary | ICD-10-CM | POA: Diagnosis not present

## 2014-06-20 DIAGNOSIS — L97511 Non-pressure chronic ulcer of other part of right foot limited to breakdown of skin: Secondary | ICD-10-CM | POA: Diagnosis not present

## 2014-06-20 DIAGNOSIS — L97521 Non-pressure chronic ulcer of other part of left foot limited to breakdown of skin: Secondary | ICD-10-CM | POA: Diagnosis not present

## 2014-06-20 DIAGNOSIS — E11621 Type 2 diabetes mellitus with foot ulcer: Secondary | ICD-10-CM | POA: Diagnosis not present

## 2014-07-04 ENCOUNTER — Encounter (HOSPITAL_BASED_OUTPATIENT_CLINIC_OR_DEPARTMENT_OTHER): Payer: PPO | Attending: Internal Medicine

## 2014-07-04 ENCOUNTER — Ambulatory Visit (HOSPITAL_COMMUNITY)
Admission: RE | Admit: 2014-07-04 | Discharge: 2014-07-04 | Disposition: A | Discharge status: Home / Self Care | Payer: PPO | Source: Ambulatory Visit | Attending: Internal Medicine | Admitting: Internal Medicine

## 2014-07-04 ENCOUNTER — Other Ambulatory Visit (HOSPITAL_COMMUNITY): Payer: Self-pay | Admitting: Internal Medicine

## 2014-07-04 DIAGNOSIS — I739 Peripheral vascular disease, unspecified: Secondary | ICD-10-CM | POA: Insufficient documentation

## 2014-07-04 DIAGNOSIS — R52 Pain, unspecified: Secondary | ICD-10-CM

## 2014-07-04 DIAGNOSIS — E1142 Type 2 diabetes mellitus with diabetic polyneuropathy: Secondary | ICD-10-CM | POA: Insufficient documentation

## 2014-07-04 DIAGNOSIS — L97512 Non-pressure chronic ulcer of other part of right foot with fat layer exposed: Secondary | ICD-10-CM | POA: Insufficient documentation

## 2014-07-04 DIAGNOSIS — E11621 Type 2 diabetes mellitus with foot ulcer: Secondary | ICD-10-CM | POA: Diagnosis not present

## 2014-07-04 DIAGNOSIS — L03031 Cellulitis of right toe: Secondary | ICD-10-CM | POA: Diagnosis not present

## 2014-07-04 DIAGNOSIS — M79671 Pain in right foot: Secondary | ICD-10-CM | POA: Diagnosis not present

## 2014-07-18 DIAGNOSIS — E11621 Type 2 diabetes mellitus with foot ulcer: Secondary | ICD-10-CM | POA: Diagnosis not present

## 2014-07-18 DIAGNOSIS — L97512 Non-pressure chronic ulcer of other part of right foot with fat layer exposed: Secondary | ICD-10-CM | POA: Diagnosis not present

## 2014-07-18 DIAGNOSIS — L03031 Cellulitis of right toe: Secondary | ICD-10-CM | POA: Diagnosis not present

## 2014-07-19 ENCOUNTER — Ambulatory Visit (INDEPENDENT_AMBULATORY_CARE_PROVIDER_SITE_OTHER): Payer: PPO | Admitting: Cardiovascular Disease

## 2014-07-19 ENCOUNTER — Encounter: Payer: Self-pay | Admitting: Cardiovascular Disease

## 2014-07-19 VITALS — BP 118/54 | HR 59 | Ht 68.0 in | Wt 154.0 lb

## 2014-07-19 DIAGNOSIS — I483 Typical atrial flutter: Secondary | ICD-10-CM | POA: Diagnosis not present

## 2014-07-19 DIAGNOSIS — I10 Essential (primary) hypertension: Secondary | ICD-10-CM | POA: Diagnosis not present

## 2014-07-19 DIAGNOSIS — E039 Hypothyroidism, unspecified: Secondary | ICD-10-CM | POA: Diagnosis not present

## 2014-07-19 DIAGNOSIS — L089 Local infection of the skin and subcutaneous tissue, unspecified: Secondary | ICD-10-CM

## 2014-07-19 DIAGNOSIS — E1169 Type 2 diabetes mellitus with other specified complication: Secondary | ICD-10-CM

## 2014-07-19 DIAGNOSIS — E11628 Type 2 diabetes mellitus with other skin complications: Secondary | ICD-10-CM

## 2014-07-19 NOTE — Patient Instructions (Signed)
Your physician wants you to follow-up in: ONE YEAR WITH DR KELLY You will receive a reminder letter in the mail two months in advance. If you don't receive a letter, please call our office to schedule the follow-up appointment.  

## 2014-07-20 ENCOUNTER — Encounter: Payer: Self-pay | Admitting: Cardiovascular Disease

## 2014-07-20 NOTE — Progress Notes (Signed)
Patient ID: [name], female   DOB: December 24, 1919, 81 y.o.   MRN: [id number]    HPI: JEFFREY VOTH is a 80 y.o. female who presents to the office today for a 20 month cardiology followup evaluation.   [name] has a history of atrial flutter and is status post atrial flutter ablation. She also has a history of hypertension, diabetes mellitus, mixed hyperlipidemia, hypothyroidism, as well as lower extremity edema. She has history of renal insufficiency.   She is unaware of recent episodes of increased heart rate. She denies recent chest pressure. She now is mostly wheelchair bound.  She suffered a stroke in January 2014. She has had issues with bilateral feet sores. She is going to the wound clinic weekly and is seen by Dr. Dellia Nims.  She undergoes wrapping of her feet. She currently lives at home. She sees Dr. Maudie Mercury for primary care.   She denies chest pain.  She is unaware of recurrent palpitations.  She has peripheral neuropathy.  She is diabetic. She presents for evaluation.  Past Medical History  Diagnosis Date  . Myocardial infarction   . Coronary artery disease   . Hypertension   . Dysrhythmia   . Peripheral vascular disease   . Hypothyroidism   . Diabetes mellitus   . Presbyesophagus 04/23/2011  . Urinary retention with incomplete bladder emptying 04/23/2011  . UTI (lower urinary tract infection) 04/21/2011  . Diastolic CHF, chronic     Grade I diastolic dysfunction on Echo 04/26/11  . Anaphylaxis 04/25/2011    Thought to be from Rocephin  . History of atrial flutter     ablation    Past Surgical History  Procedure Laterality Date  . Cholecystectomy    . Colonoscopy  01/15/2011    Procedure: COLONOSCOPY;  Surgeon: Beryle Beams, MD;  Location: [facility];  Service: Endoscopy;  Laterality: N/A;  . Amputation Right 04/14/2012    Procedure: AMPUTATION RAY;  Surgeon: Newt Minion, MD;  Location: [facility];  Service: Orthopedics;  Laterality: Right;  ankle block  . Cardiac  electrophysiology mapping and ablation    . Nm myocar perf wall motion  01/23/2009    mild ischemia mid inferior & apical inferior.  EF 75%    Allergies  Allergen Reactions  . Ephedrine     Unknown.    . Morphine Other (See Comments)    Difficulty waking up & talking after surgery  . Rocephin [Ceftriaxone Sodium In Dextrose]     Developed acute resp distress, wheezing after 2nd dose.    Current Outpatient Prescriptions  Medication Sig Dispense Refill  . aspirin 81 MG tablet Take 81 mg by mouth daily.    . carvedilol (COREG) 6.25 MG tablet Take 6.25 mg by mouth 2 (two) times daily with a meal.      . diltiazem (CARDIZEM CD) 180 MG 24 hr capsule Take 180 mg by mouth at bedtime.     . furosemide (LASIX) 20 MG tablet Take 20 mg by mouth 2 (two) times daily. Take one tablet in the morning and one tablet in the evening.    . gabapentin (NEURONTIN) 300 MG capsule Take 300 mg by mouth 2 (two) times daily.    . insulin aspart (NOVOLOG) 100 UNIT/ML injection Inject 5 Units into the skin 3 (three) times daily before meals.     . insulin glargine (LANTUS) 100 UNIT/ML injection Inject 28 Units into the skin every morning.    . isosorbide mononitrate (IMDUR) 30 MG  24 hr tablet Take 30 mg by mouth daily.      Marland Kitchen levothyroxine (SYNTHROID, LEVOTHROID) 175 MCG tablet Take 175 mcg by mouth daily before breakfast.    . Tamsulosin HCl (FLOMAX) 0.4 MG CAPS Take 0.4 mg by mouth every morning.     Marland Kitchen VITAMIN E PO Take 1 capsule by mouth daily.     No current facility-administered medications for this visit.    History   Social History  . Marital Status: Widowed    Spouse Name: N/A  . Number of Children: N/A  . Years of Education: N/A   Occupational History  . Not on file.   Social History Main Topics  . Smoking status: Former Smoker    Types: Cigarettes    Quit date: 04/20/1981  . Smokeless tobacco: Never Used  . Alcohol Use: Yes     Comment: social  . Drug Use: No  . Sexual Activity: Not  Currently   Other Topics Concern  . Not on file   Social History Narrative    Family History  Problem Relation Age of Onset  . Cancer Sister   . Diabetes Mother   . Heart disease Father    Socially, she lives by herself. She is widowed has 2 children and 2 grandchildren. No tobacco or alcohol use.   ROS General: Negative; No fevers, chills, or night sweats;  HEENT: Negative; No changes in vision or hearing, sinus congestion, difficulty swallowing Pulmonary: Negative; No cough, wheezing, shortness of breath, hemoptysis Cardiovascular: Negative; No chest pain, presyncope, syncope, palpitations GI: Negative; No nausea, vomiting, diarrhea, or abdominal pain GU: Negative; No dysuria, hematuria, or difficulty voiding Musculoskeletal: Negative; no myalgias, joint pain, or weakness Hematologic/Oncology: Negative; no easy bruising, bleeding Endocrine: Negative; no heat/cold intolerance; no diabetes Neuro:   Remote stroke Skin:  Difficult to heal feet sores bilaterally Psychiatric: Negative; No behavioral problems, depression Sleep: Negative; No snoring, daytime sleepiness, hypersomnolence, bruxism, restless legs, hypnogognic hallucinations, no cataplexy Other comprehensive 14 point system review is negative.  PE BP 118/54 mmHg  Pulse 59  Ht $R'5\' 8"'Yc$  (1.727 m)  Wt 154 lb (69.854 kg)  BMI 23.42 kg/m2  Repeat blood pressure by me 112/64  Wt Readings from Last 3 Encounters:  07/19/14 154 lb (69.854 kg)  11/18/13 164 lb 8 oz (74.617 kg)  10/02/13 166 lb 12.8 oz (75.66 kg)   General: Alert, oriented, no distress.  Skin: normal turgor, no rashes HEENT: Normocephalic, atraumatic. Pupils round and reactive; sclera anicteric;no lid lag.  Nose without nasal septal hypertrophy Mouth/Parynx benign; Mallinpatti scale 3 Neck: No JVD, no carotid bruits with normal carotid upstroke. Lungs: clear to ausculatation and percussion; no wheezing or rales Chest wall: Nontender to palpation Heart:  RRR, s1 s2 normal 1/6 systolic murmur; .  No diastolic murmur.  No S3 gallop.  No rubs thrills or heaves  Abdomen: soft, nontender; no hepatosplenomehaly, BS+; abdominal aorta nontender and not dilated by palpation.  back: No CVA tenderness Pulses 2+ Extremities:  Both feet bandaged.  Neurologic: grossly nonfocal Psychological: Normal affect  ECG (independently read by me):  Sinus bradycardia 59 bpm. QS complex V1 V2.  October 2014ECG: Sinus rhythm at 65 beats per minute. QS complex in V1 and V2. No significant ST changes.  LABS:  BMP Latest Ref Rng 11/21/2013 11/20/2013 11/18/2013  Glucose 70 - 99 mg/dL 188(H) 250(H) 351(H)  BUN 6 - 23 mg/dL $Remove'20 22 22  'ZoFTfJm$ Creatinine 0.50 - 1.10 mg/dL 1.56(H) 1.39(H) 1.12(H)  Sodium 137 -  147 mEq/L 135(L) 133(L) 131(L)  Potassium 3.7 - 5.3 mEq/L 4.1 4.0 4.6  Chloride 96 - 112 mEq/L 97 96 94(L)  CO2 19 - 32 mEq/L $Remove'25 25 26  'ZGripGe$ Calcium 8.4 - 10.5 mg/dL 8.6 8.5 8.9   Hepatic Function Latest Ref Rng 11/18/2013 08/27/2013 02/09/2012  Total Protein 6.0 - 8.3 g/dL 7.0 8.2 6.7  Albumin 3.5 - 5.2 g/dL 3.0(L) 4.0 2.7(L)  AST 0 - 37 U/L $Remo'10 17 10  'ymMCz$ ALT 0 - 35 U/L $Remo'7 13 9  'OPUgk$ Alk Phosphatase 39 - 117 U/L 58 62 63  Total Bilirubin 0.3 - 1.2 mg/dL 0.3 0.3 0.3  Bilirubin, Direct 0.0 - 0.3 mg/dL - - -   CBC Latest Ref Rng 11/20/2013 11/18/2013 08/30/2013  WBC 4.0 - 10.5 K/uL 7.8 9.2 8.1  Hemoglobin 12.0 - 15.0 g/dL 11.6(L) 12.8 10.2(L)  Hematocrit 36.0 - 46.0 % 34.8(L) 39.3 30.7(L)  Platelets 150 - 400 K/uL 254 311 263   Lab Results  Component Value Date   MCV 84.1 11/20/2013   MCV 84.3 11/18/2013   MCV 83.4 08/30/2013   Lab Results  Component Value Date   TSH 0.502 11/18/2013   Lab Results  Component Value Date   HGBA1C 9.0* 11/18/2013   Lipid Panel     Component Value Date/Time   CHOL 164 02/04/2012 0540   TRIG 150* 02/04/2012 0540   HDL 55 02/04/2012 0540   CHOLHDL 3.0 02/04/2012 0540   VLDL 30 02/04/2012 0540   LDLCALC 79 02/04/2012 0540      RADIOLOGY: No results found.    ASSESSMENT AND PLAN: [name] is 81 years old female who has a remote history of atrial flutter and is status post atrial flutter ablation.  She is maintaining sinus rhythm.  She has normal intervals on ECG.  She denies any chest pain.  Her blood pressure today is stable on carvedilol 6.25 mg twice a day, diltiazem CD 180 mg, isosorbide 30 mg, and she has been taking furosemide 20 mg in the morning and before going to bed.  She does wake up several times at nighttime for urination.  I have recommended that she change her regimen such that she will take 20 mg in the morning and the second dose around 4 clock in the afternoon rather than before going to bed.  She is on levothyroxine 175 g for hypothyroidism.  She is continuing to have problems with foot sores which are slowly healing but are improving with her weekly wound clinic visits. I reviewed laboratory from last year.  Dr. Maudie Mercury is her primary physician, and I will try to obtain results of any more recent blood work. Presently, she is well compensated from a cardiovascular standpoint. She will continue her current regimen with changes as noted above.  I will see her in one year for reevaluation or sooner if problems arise.  Time spent: 25 minutes  Troy Sine, MD, [facility]  07/20/2014 10:50 AM

## 2014-07-25 DIAGNOSIS — L97512 Non-pressure chronic ulcer of other part of right foot with fat layer exposed: Secondary | ICD-10-CM | POA: Diagnosis not present

## 2014-07-25 DIAGNOSIS — L03031 Cellulitis of right toe: Secondary | ICD-10-CM | POA: Diagnosis not present

## 2014-07-25 DIAGNOSIS — E11621 Type 2 diabetes mellitus with foot ulcer: Secondary | ICD-10-CM | POA: Diagnosis not present

## 2014-08-01 ENCOUNTER — Encounter (HOSPITAL_BASED_OUTPATIENT_CLINIC_OR_DEPARTMENT_OTHER): Payer: PPO | Attending: Internal Medicine

## 2014-08-01 ENCOUNTER — Other Ambulatory Visit: Payer: Self-pay | Admitting: Internal Medicine

## 2014-08-01 DIAGNOSIS — L97511 Non-pressure chronic ulcer of other part of right foot limited to breakdown of skin: Secondary | ICD-10-CM | POA: Insufficient documentation

## 2014-08-01 DIAGNOSIS — L03031 Cellulitis of right toe: Secondary | ICD-10-CM | POA: Diagnosis not present

## 2014-08-01 DIAGNOSIS — E11621 Type 2 diabetes mellitus with foot ulcer: Secondary | ICD-10-CM | POA: Insufficient documentation

## 2014-08-01 DIAGNOSIS — E118 Type 2 diabetes mellitus with unspecified complications: Secondary | ICD-10-CM

## 2014-08-01 DIAGNOSIS — L97512 Non-pressure chronic ulcer of other part of right foot with fat layer exposed: Secondary | ICD-10-CM

## 2014-08-08 ENCOUNTER — Inpatient Hospital Stay (HOSPITAL_COMMUNITY): Payer: PPO

## 2014-08-08 ENCOUNTER — Inpatient Hospital Stay (HOSPITAL_COMMUNITY)
Admission: EM | Admit: 2014-08-08 | Discharge: 2014-08-13 | DRG: 616 | Disposition: A | Discharge status: Skilled Nursing Facility | Payer: PPO | Attending: Family Medicine | Admitting: Family Medicine

## 2014-08-08 ENCOUNTER — Encounter (HOSPITAL_COMMUNITY): Payer: Self-pay

## 2014-08-08 ENCOUNTER — Emergency Department (HOSPITAL_COMMUNITY): Payer: PPO

## 2014-08-08 DIAGNOSIS — S91101A Unspecified open wound of right great toe without damage to nail, initial encounter: Secondary | ICD-10-CM | POA: Diagnosis not present

## 2014-08-08 DIAGNOSIS — Z89421 Acquired absence of other right toe(s): Secondary | ICD-10-CM | POA: Diagnosis not present

## 2014-08-08 DIAGNOSIS — M86179 Other acute osteomyelitis, unspecified ankle and foot: Secondary | ICD-10-CM | POA: Diagnosis present

## 2014-08-08 DIAGNOSIS — Z833 Family history of diabetes mellitus: Secondary | ICD-10-CM | POA: Diagnosis not present

## 2014-08-08 DIAGNOSIS — N179 Acute kidney failure, unspecified: Secondary | ICD-10-CM | POA: Diagnosis present

## 2014-08-08 DIAGNOSIS — L03115 Cellulitis of right lower limb: Secondary | ICD-10-CM | POA: Diagnosis present

## 2014-08-08 DIAGNOSIS — I2510 Atherosclerotic heart disease of native coronary artery without angina pectoris: Secondary | ICD-10-CM | POA: Diagnosis present

## 2014-08-08 DIAGNOSIS — Z79899 Other long term (current) drug therapy: Secondary | ICD-10-CM | POA: Diagnosis not present

## 2014-08-08 DIAGNOSIS — D631 Anemia in chronic kidney disease: Secondary | ICD-10-CM | POA: Diagnosis present

## 2014-08-08 DIAGNOSIS — R2689 Other abnormalities of gait and mobility: Secondary | ICD-10-CM | POA: Diagnosis present

## 2014-08-08 DIAGNOSIS — N189 Chronic kidney disease, unspecified: Secondary | ICD-10-CM

## 2014-08-08 DIAGNOSIS — L02611 Cutaneous abscess of right foot: Secondary | ICD-10-CM | POA: Diagnosis not present

## 2014-08-08 DIAGNOSIS — E860 Dehydration: Secondary | ICD-10-CM | POA: Diagnosis present

## 2014-08-08 DIAGNOSIS — M86171 Other acute osteomyelitis, right ankle and foot: Secondary | ICD-10-CM | POA: Diagnosis not present

## 2014-08-08 DIAGNOSIS — E871 Hypo-osmolality and hyponatremia: Secondary | ICD-10-CM | POA: Diagnosis present

## 2014-08-08 DIAGNOSIS — Z809 Family history of malignant neoplasm, unspecified: Secondary | ICD-10-CM | POA: Diagnosis not present

## 2014-08-08 DIAGNOSIS — I129 Hypertensive chronic kidney disease with stage 1 through stage 4 chronic kidney disease, or unspecified chronic kidney disease: Secondary | ICD-10-CM | POA: Diagnosis present

## 2014-08-08 DIAGNOSIS — I252 Old myocardial infarction: Secondary | ICD-10-CM | POA: Diagnosis not present

## 2014-08-08 DIAGNOSIS — N184 Chronic kidney disease, stage 4 (severe): Secondary | ICD-10-CM | POA: Diagnosis present

## 2014-08-08 DIAGNOSIS — J189 Pneumonia, unspecified organism: Secondary | ICD-10-CM | POA: Diagnosis present

## 2014-08-08 DIAGNOSIS — L03039 Cellulitis of unspecified toe: Secondary | ICD-10-CM

## 2014-08-08 DIAGNOSIS — L97509 Non-pressure chronic ulcer of other part of unspecified foot with unspecified severity: Secondary | ICD-10-CM | POA: Insufficient documentation

## 2014-08-08 DIAGNOSIS — Z885 Allergy status to narcotic agent status: Secondary | ICD-10-CM | POA: Diagnosis not present

## 2014-08-08 DIAGNOSIS — Z7982 Long term (current) use of aspirin: Secondary | ICD-10-CM

## 2014-08-08 DIAGNOSIS — Z794 Long term (current) use of insulin: Secondary | ICD-10-CM

## 2014-08-08 DIAGNOSIS — E11628 Type 2 diabetes mellitus with other skin complications: Secondary | ICD-10-CM | POA: Diagnosis present

## 2014-08-08 DIAGNOSIS — L03031 Cellulitis of right toe: Secondary | ICD-10-CM | POA: Diagnosis not present

## 2014-08-08 DIAGNOSIS — I5033 Acute on chronic diastolic (congestive) heart failure: Secondary | ICD-10-CM | POA: Diagnosis not present

## 2014-08-08 DIAGNOSIS — R4702 Dysphasia: Secondary | ICD-10-CM | POA: Diagnosis present

## 2014-08-08 DIAGNOSIS — Y95 Nosocomial condition: Secondary | ICD-10-CM | POA: Diagnosis present

## 2014-08-08 DIAGNOSIS — Z87891 Personal history of nicotine dependence: Secondary | ICD-10-CM

## 2014-08-08 DIAGNOSIS — Z8249 Family history of ischemic heart disease and other diseases of the circulatory system: Secondary | ICD-10-CM | POA: Diagnosis not present

## 2014-08-08 DIAGNOSIS — E1142 Type 2 diabetes mellitus with diabetic polyneuropathy: Secondary | ICD-10-CM | POA: Diagnosis not present

## 2014-08-08 DIAGNOSIS — S91101D Unspecified open wound of right great toe without damage to nail, subsequent encounter: Secondary | ICD-10-CM | POA: Diagnosis not present

## 2014-08-08 DIAGNOSIS — Z881 Allergy status to other antibiotic agents status: Secondary | ICD-10-CM

## 2014-08-08 DIAGNOSIS — I5032 Chronic diastolic (congestive) heart failure: Secondary | ICD-10-CM | POA: Diagnosis present

## 2014-08-08 DIAGNOSIS — L089 Local infection of the skin and subcutaneous tissue, unspecified: Secondary | ICD-10-CM | POA: Diagnosis present

## 2014-08-08 DIAGNOSIS — R0600 Dyspnea, unspecified: Secondary | ICD-10-CM | POA: Insufficient documentation

## 2014-08-08 DIAGNOSIS — R269 Unspecified abnormalities of gait and mobility: Secondary | ICD-10-CM | POA: Diagnosis present

## 2014-08-08 DIAGNOSIS — E875 Hyperkalemia: Secondary | ICD-10-CM | POA: Diagnosis present

## 2014-08-08 DIAGNOSIS — Z888 Allergy status to other drugs, medicaments and biological substances status: Secondary | ICD-10-CM | POA: Diagnosis not present

## 2014-08-08 DIAGNOSIS — M7989 Other specified soft tissue disorders: Secondary | ICD-10-CM | POA: Diagnosis present

## 2014-08-08 DIAGNOSIS — N183 Chronic kidney disease, stage 3 (moderate): Secondary | ICD-10-CM | POA: Diagnosis not present

## 2014-08-08 DIAGNOSIS — L97511 Non-pressure chronic ulcer of other part of right foot limited to breakdown of skin: Secondary | ICD-10-CM | POA: Diagnosis not present

## 2014-08-08 DIAGNOSIS — L02619 Cutaneous abscess of unspecified foot: Secondary | ICD-10-CM | POA: Diagnosis present

## 2014-08-08 DIAGNOSIS — L03119 Cellulitis of unspecified part of limb: Secondary | ICD-10-CM | POA: Diagnosis present

## 2014-08-08 DIAGNOSIS — E1169 Type 2 diabetes mellitus with other specified complication: Secondary | ICD-10-CM | POA: Diagnosis present

## 2014-08-08 DIAGNOSIS — E039 Hypothyroidism, unspecified: Secondary | ICD-10-CM | POA: Diagnosis present

## 2014-08-08 DIAGNOSIS — M869 Osteomyelitis, unspecified: Secondary | ICD-10-CM

## 2014-08-08 DIAGNOSIS — L97519 Non-pressure chronic ulcer of other part of right foot with unspecified severity: Secondary | ICD-10-CM

## 2014-08-08 DIAGNOSIS — E1140 Type 2 diabetes mellitus with diabetic neuropathy, unspecified: Secondary | ICD-10-CM | POA: Diagnosis present

## 2014-08-08 DIAGNOSIS — E11621 Type 2 diabetes mellitus with foot ulcer: Secondary | ICD-10-CM | POA: Diagnosis present

## 2014-08-08 DIAGNOSIS — I10 Essential (primary) hypertension: Secondary | ICD-10-CM | POA: Diagnosis present

## 2014-08-08 DIAGNOSIS — I739 Peripheral vascular disease, unspecified: Secondary | ICD-10-CM | POA: Diagnosis present

## 2014-08-08 HISTORY — DX: Unspecified atrial flutter: I48.92

## 2014-08-08 HISTORY — DX: Enterocolitis due to Clostridium difficile, not specified as recurrent: A04.72

## 2014-08-08 LAB — CBG MONITORING, ED: GLUCOSE-CAPILLARY: 174 mg/dL — AB (ref 65–99)

## 2014-08-08 LAB — CBC WITH DIFFERENTIAL/PLATELET
Basophils Absolute: 0.1 10*3/uL (ref 0.0–0.1)
Basophils Relative: 1 % (ref 0–1)
Eosinophils Absolute: 0.4 10*3/uL (ref 0.0–0.7)
Eosinophils Relative: 3 % (ref 0–5)
HCT: 31.8 % — ABNORMAL LOW (ref 36.0–46.0)
Hemoglobin: 10.3 g/dL — ABNORMAL LOW (ref 12.0–15.0)
Lymphocytes Relative: 13 % (ref 12–46)
Lymphs Abs: 1.5 10*3/uL (ref 0.7–4.0)
MCH: 26.3 pg (ref 26.0–34.0)
MCHC: 32.4 g/dL (ref 30.0–36.0)
MCV: 81.3 fL (ref 78.0–100.0)
MONO ABS: 1.2 10*3/uL — AB (ref 0.1–1.0)
MONOS PCT: 11 % (ref 3–12)
Neutro Abs: 8.3 10*3/uL — ABNORMAL HIGH (ref 1.7–7.7)
Neutrophils Relative %: 72 % (ref 43–77)
Platelets: 386 10*3/uL (ref 150–400)
RBC: 3.91 MIL/uL (ref 3.87–5.11)
RDW: 14 % (ref 11.5–15.5)
WBC: 11.5 10*3/uL — ABNORMAL HIGH (ref 4.0–10.5)

## 2014-08-08 LAB — COMPREHENSIVE METABOLIC PANEL
ALBUMIN: 2.8 g/dL — AB (ref 3.5–5.0)
ALT: 8 U/L — ABNORMAL LOW (ref 14–54)
AST: 10 U/L — AB (ref 15–41)
Alkaline Phosphatase: 73 U/L (ref 38–126)
Anion gap: 6 (ref 5–15)
BILIRUBIN TOTAL: 0.1 mg/dL — AB (ref 0.3–1.2)
BUN: 30 mg/dL — ABNORMAL HIGH (ref 6–20)
CALCIUM: 8.4 mg/dL — AB (ref 8.9–10.3)
CO2: 29 mmol/L (ref 22–32)
CREATININE: 1.83 mg/dL — AB (ref 0.44–1.00)
Chloride: 95 mmol/L — ABNORMAL LOW (ref 101–111)
GFR calc non Af Amer: 23 mL/min — ABNORMAL LOW (ref >60)
GFR, EST AFRICAN AMERICAN: 26 mL/min — AB (ref >60)
GLUCOSE: 193 mg/dL — AB (ref 65–99)
Potassium: 5.2 mmol/L — ABNORMAL HIGH (ref 3.5–5.1)
Sodium: 130 mmol/L — ABNORMAL LOW (ref 135–145)
Total Protein: 7.4 g/dL (ref 6.5–8.1)

## 2014-08-08 LAB — GLUCOSE, CAPILLARY: Glucose-Capillary: 327 mg/dL — ABNORMAL HIGH (ref 65–99)

## 2014-08-08 MED ORDER — TAMSULOSIN HCL 0.4 MG PO CAPS
0.4000 mg | ORAL_CAPSULE | Freq: Every morning | ORAL | Status: DC
  Administered 2014-08-09 – 2014-08-13 (×4): 0.4 mg via ORAL
  Filled 2014-08-08 (×5): qty 1

## 2014-08-08 MED ORDER — ONDANSETRON HCL 4 MG/2ML IJ SOLN
4.0000 mg | Freq: Four times a day (QID) | INTRAMUSCULAR | Status: DC | PRN
  Administered 2014-08-10: 4 mg via INTRAVENOUS

## 2014-08-08 MED ORDER — INSULIN ASPART 100 UNIT/ML ~~LOC~~ SOLN: 4.0000 [IU] | Freq: Three times a day (TID) | SUBCUTANEOUS | Status: DC

## 2014-08-08 MED ORDER — DILTIAZEM HCL ER COATED BEADS 180 MG PO CP24
180.0000 mg | ORAL_CAPSULE | Freq: Every day | ORAL | Status: DC
  Administered 2014-08-08 – 2014-08-12 (×5): 180 mg via ORAL
  Filled 2014-08-08 (×5): qty 1

## 2014-08-08 MED ORDER — PIPERACILLIN-TAZOBACTAM 3.375 G IVPB 30 MIN
3.3750 g | Freq: Once | INTRAVENOUS | Status: AC
  Administered 2014-08-08: 3.375 g via INTRAVENOUS
  Filled 2014-08-08: qty 50

## 2014-08-08 MED ORDER — ONDANSETRON HCL 4 MG PO TABS: 4.0000 mg | ORAL_TABLET | Freq: Four times a day (QID) | ORAL | Status: DC | PRN

## 2014-08-08 MED ORDER — ISOSORBIDE MONONITRATE ER 30 MG PO TB24
30.0000 mg | ORAL_TABLET | Freq: Every day | ORAL | Status: DC
  Administered 2014-08-08 – 2014-08-12 (×5): 30 mg via ORAL
  Filled 2014-08-08 (×5): qty 1

## 2014-08-08 MED ORDER — ENOXAPARIN SODIUM 40 MG/0.4ML ~~LOC~~ SOLN: 40.0000 mg | SUBCUTANEOUS | Status: DC

## 2014-08-08 MED ORDER — PIPERACILLIN-TAZOBACTAM 3.375 G IVPB
3.3750 g | Freq: Three times a day (TID) | INTRAVENOUS | Status: DC
  Administered 2014-08-08 – 2014-08-13 (×14): 3.375 g via INTRAVENOUS
  Filled 2014-08-08 (×16): qty 50

## 2014-08-08 MED ORDER — ASPIRIN 81 MG PO TABS: 81.0000 mg | ORAL_TABLET | Freq: Every day | ORAL | Status: DC

## 2014-08-08 MED ORDER — SODIUM CHLORIDE 0.9 % IV SOLN
INTRAVENOUS | Status: AC
  Administered 2014-08-08: 18:00:00 via INTRAVENOUS

## 2014-08-08 MED ORDER — VANCOMYCIN HCL IN DEXTROSE 750-5 MG/150ML-% IV SOLN
750.0000 mg | INTRAVENOUS | Status: DC
  Administered 2014-08-09 – 2014-08-12 (×4): 750 mg via INTRAVENOUS
  Filled 2014-08-08 (×5): qty 150

## 2014-08-08 MED ORDER — INSULIN ASPART 100 UNIT/ML ~~LOC~~ SOLN: 0.0000 [IU] | Freq: Three times a day (TID) | SUBCUTANEOUS | Status: DC

## 2014-08-08 MED ORDER — VANCOMYCIN HCL 10 G IV SOLR
1250.0000 mg | Freq: Once | INTRAVENOUS | Status: DC
  Administered 2014-08-08: 1250 mg via INTRAVENOUS
  Filled 2014-08-08: qty 1250

## 2014-08-08 MED ORDER — GABAPENTIN 300 MG PO CAPS
300.0000 mg | ORAL_CAPSULE | Freq: Two times a day (BID) | ORAL | Status: DC
  Administered 2014-08-08 – 2014-08-13 (×9): 300 mg via ORAL
  Filled 2014-08-08 (×10): qty 1

## 2014-08-08 MED ORDER — INSULIN GLARGINE 100 UNIT/ML ~~LOC~~ SOLN
30.0000 [IU] | Freq: Every morning | SUBCUTANEOUS | Status: DC
  Administered 2014-08-09 – 2014-08-10 (×2): 30 [IU] via SUBCUTANEOUS
  Filled 2014-08-08 (×3): qty 0.3

## 2014-08-08 MED ORDER — CARVEDILOL 6.25 MG PO TABS
6.2500 mg | ORAL_TABLET | Freq: Two times a day (BID) | ORAL | Status: DC
  Administered 2014-08-08 – 2014-08-13 (×9): 6.25 mg via ORAL
  Filled 2014-08-08 (×10): qty 1

## 2014-08-08 MED ORDER — ENOXAPARIN SODIUM 30 MG/0.3ML ~~LOC~~ SOLN
30.0000 mg | SUBCUTANEOUS | Status: DC
  Administered 2014-08-08 – 2014-08-09 (×2): 30 mg via SUBCUTANEOUS
  Filled 2014-08-08 (×2): qty 0.3

## 2014-08-08 MED ORDER — ASPIRIN EC 81 MG PO TBEC
81.0000 mg | DELAYED_RELEASE_TABLET | Freq: Every day | ORAL | Status: DC
  Administered 2014-08-09 – 2014-08-13 (×4): 81 mg via ORAL
  Filled 2014-08-08 (×5): qty 1

## 2014-08-08 MED ORDER — LEVOTHYROXINE SODIUM 175 MCG PO TABS
175.0000 ug | ORAL_TABLET | Freq: Every day | ORAL | Status: DC
Start: 2014-08-09 — End: 2014-08-13
  Administered 2014-08-09 – 2014-08-13 (×4): 175 ug via ORAL
  Filled 2014-08-08 (×5): qty 1

## 2014-08-08 NOTE — ED Notes (Signed)
Patient transported to X-ray 

## 2014-08-08 NOTE — Progress Notes (Addendum)
ANTIBIOTIC CONSULT NOTE - INITIAL  Pharmacy Consult for Vancomycin & Zosyn Indication: LE cellulitis  Allergies  Allergen Reactions  . Ephedrine     Unknown.    . Morphine Other (See Comments)    Difficulty waking up & talking after surgery  . Rocephin [Ceftriaxone Sodium In Dextrose]     Developed acute resp distress, wheezing after 2nd dose.   Patient Measurements:    Vital Signs: Temp: 97.9 F (36.6 C) (07/14 1258) Temp Source: Oral (07/14 1258) BP: 138/53 mmHg (07/14 1258) Pulse Rate: 69 (07/14 1258) Intake/Output from previous day:   Intake/Output from this shift:    Labs: No results for input(s): WBC, HGB, PLT, LABCREA, CREATININE in the last 72 hours. CrCl cannot be calculated (Unknown ideal weight.). No results for input(s): VANCOTROUGH, VANCOPEAK, VANCORANDOM, GENTTROUGH, GENTPEAK, GENTRANDOM, TOBRATROUGH, TOBRAPEAK, TOBRARND, AMIKACINPEAK, AMIKACINTROU, AMIKACIN in the last 72 hours.   Microbiology: No results found for this or any previous visit (from the past 720 hour(s)).  Medical History: Past Medical History  Diagnosis Date  . Myocardial infarction   . Coronary artery disease   . Hypertension   . Dysrhythmia   . Peripheral vascular disease   . Hypothyroidism   . Diabetes mellitus   . Presbyesophagus 04/23/2011  . Urinary retention with incomplete bladder emptying 04/23/2011  . UTI (lower urinary tract infection) 04/21/2011  . Diastolic CHF, chronic     Grade I diastolic dysfunction on Echo 04/26/11  . Anaphylaxis 04/25/2011    Thought to be from Rocephin  . History of atrial flutter     ablation   Medications:  Scheduled:   Anti-infectives    Start     Dose/Rate Route Frequency Ordered Stop   08/08/14 1500  vancomycin (VANCOCIN) 1,250 mg in sodium chloride 0.9 % 250 mL IVPB     1,250 mg 166.7 mL/hr over 90 Minutes Intravenous  Once 08/08/14 1355     08/08/14 1400  piperacillin-tazobactam (ZOSYN) IVPB 3.375 g     3.375 g 100 mL/hr over 30  Minutes Intravenous  Once 08/08/14 1352       Assessment: 80 yoF with ongoing treatment of R great toe wound at [facility]. Now with two week hx of swelling, pain and recent drainage. Sent to ED from [facility].  Admit labs not yet drawn, waiting for results for further abx dosing  Goal of Therapy:  Vancomycin trough level 15-20 mcg/ml  Plan:   Vanc 1250mg  x1, Zosyn 3.375gm x1 over 30 min  Baseline SCr ~ 22 ml/min  Continue Zosyn 3.375gm q8hr- 4 hr infusion  Vancomycin 750mg  IV q24 beginning 7/15  Minda Ditto PharmD Pager [phone] 08/08/2014, 2:03 PM

## 2014-08-08 NOTE — ED Notes (Signed)
Pt with wound rt big toe. Has been there since October.  [name] a diabetic.  Per primary MD, needs antibiotics.

## 2014-08-08 NOTE — Consult Note (Signed)
Reason for Consult:Ulcerations right great toe Referring Physician: Kemisha, Bonnette is an 81 y.o. female.  HPI: 81 year old female with right foot ulcerations great toe since October of 2015. She has been being treated at wound care center for these ulcerations. However today she went to wound care and was told to go to ER for IV antibiotics to treat the ulcers. She denies fevers , chills, or pain . Reports chronic vomiting of unknown etiology that has not changed. She is a diabetic and reports has been diabetic since 1950. She has neuropathy in both feet. Finished Doxycycline and Cipro yesterday. History of right 4th toe amputation by Dr. Sharol Given.  Past Medical History  Diagnosis Date  . Myocardial infarction   . Coronary artery disease   . Hypertension   . Dysrhythmia   . Peripheral vascular disease   . Hypothyroidism   . Diabetes mellitus   . Presbyesophagus 04/23/2011  . Urinary retention with incomplete bladder emptying 04/23/2011  . UTI (lower urinary tract infection) 04/21/2011  . Diastolic CHF, chronic     Grade I diastolic dysfunction on Echo 04/26/11  . Anaphylaxis 04/25/2011    Thought to be from Rocephin  . History of atrial flutter     ablation    Past Surgical History  Procedure Laterality Date  . Cholecystectomy    . Colonoscopy  01/15/2011    Procedure: COLONOSCOPY;  Surgeon: Beryle Beams, MD;  Location: [facility];  Service: Endoscopy;  Laterality: N/A;  . Amputation Right 04/14/2012    Procedure: AMPUTATION RAY;  Surgeon: Newt Minion, MD;  Location: [facility];  Service: Orthopedics;  Laterality: Right;  ankle block  . Cardiac electrophysiology mapping and ablation    . Nm myocar perf wall motion  01/23/2009    mild ischemia mid inferior & apical inferior.  EF 75%    Family History  Problem Relation Age of Onset  . Cancer Sister   . Diabetes Mother   . Heart disease Father     Social History:  reports that she quit smoking about 33 years ago. Her  smoking use included Cigarettes. She has never used smokeless tobacco. She reports that she drinks alcohol. She reports that she does not use illicit drugs.  Allergies:  Allergies  Allergen Reactions  . Ephedrine     Unknown.    . Morphine Other (See Comments)    Difficulty waking up & talking after surgery  . Rocephin [Ceftriaxone Sodium In Dextrose]     Developed acute resp distress, wheezing after 2nd dose.    Medications: I have reviewed the patient's current medications.  Results for orders placed or performed during the hospital encounter of 08/08/14 (from the past 48 hour(s))  POC CBG, ED     Status: Abnormal   Collection Time: 08/08/14  1:02 PM  Result Value Ref Range   Glucose-Capillary 174 (H) 65 - 99 mg/dL  Comprehensive metabolic panel     Status: Abnormal   Collection Time: 08/08/14  2:11 PM  Result Value Ref Range   Sodium 130 (L) 135 - 145 mmol/L   Potassium 5.2 (H) 3.5 - 5.1 mmol/L   Chloride 95 (L) 101 - 111 mmol/L   CO2 29 22 - 32 mmol/L   Glucose, Bld 193 (H) 65 - 99 mg/dL   BUN 30 (H) 6 - 20 mg/dL   Creatinine, Ser 1.83 (H) 0.44 - 1.00 mg/dL   Calcium 8.4 (L) 8.9 - 10.3 mg/dL  Total Protein 7.4 6.5 - 8.1 g/dL   Albumin 2.8 (L) 3.5 - 5.0 g/dL   AST 10 (L) 15 - 41 U/L   ALT 8 (L) 14 - 54 U/L   Alkaline Phosphatase 73 38 - 126 U/L   Total Bilirubin 0.1 (L) 0.3 - 1.2 mg/dL   GFR calc non Af Amer 23 (L) >60 mL/min   GFR calc Af Amer 26 (L) >60 mL/min    Comment: (NOTE) The eGFR has been calculated using the CKD EPI equation. This calculation has not been validated in all clinical situations. eGFR's persistently <60 mL/min signify possible Chronic Kidney Disease.    Anion gap 6 5 - 15  CBC with Differential     Status: Abnormal   Collection Time: 08/08/14  2:11 PM  Result Value Ref Range   WBC 11.5 (H) 4.0 - 10.5 K/uL   RBC 3.91 3.87 - 5.11 MIL/uL   Hemoglobin 10.3 (L) 12.0 - 15.0 g/dL   HCT 31.8 (L) 36.0 - 46.0 %   MCV 81.3 78.0 - 100.0 fL   MCH  26.3 26.0 - 34.0 pg   MCHC 32.4 30.0 - 36.0 g/dL   RDW 14.0 11.5 - 15.5 %   Platelets 386 150 - 400 K/uL   Neutrophils Relative % 72 43 - 77 %   Neutro Abs 8.3 (H) 1.7 - 7.7 K/uL   Lymphocytes Relative 13 12 - 46 %   Lymphs Abs 1.5 0.7 - 4.0 K/uL   Monocytes Relative 11 3 - 12 %   Monocytes Absolute 1.2 (H) 0.1 - 1.0 K/uL   Eosinophils Relative 3 0 - 5 %   Eosinophils Absolute 0.4 0.0 - 0.7 K/uL   Basophils Relative 1 0 - 1 %   Basophils Absolute 0.1 0.0 - 0.1 K/uL    Dg Chest 2 View  08/08/2014   CLINICAL DATA:  Chronic wound involving the right great toe.  EXAM: CHEST  2 VIEW  COMPARISON:  08/27/2013  FINDINGS: The heart is borderline enlarged but stable. The mediastinal and hilar contours are within normal limits and unchanged. There is mild tortuosity and calcification of the thoracic aorta. There are chronic bronchitic type interstitial lung changes but no definite acute overlying pulmonary process. No pleural effusion. Remote healed rib fractures are noted along with remote thoracic compression fractures.  IMPRESSION: Chronic lung changes but no acute pulmonary findings.   Electronically Signed   By: Marijo Sanes M.D.   On: 08/08/2014 14:39    Review of Systems  Constitutional: Negative for fever, chills and diaphoresis.  Eyes: Negative.   Gastrointestinal: Positive for vomiting.       Reports chronic vomiting  Musculoskeletal: Negative.   Skin:       Ulcer right foot   Neurological:       Decreased sensation bilateral feet  Psychiatric/Behavioral: Negative.    Blood pressure 139/77, pulse 69, temperature 97.6 F (36.4 C), temperature source Oral, resp. rate 19, SpO2 93 %. Physical Exam  Constitutional: She is oriented to person, place, and time. She appears well-developed and well-nourished.  HENT:  Head: Normocephalic and atraumatic.  Cardiovascular: Normal rate and intact distal pulses.   Respiratory: Effort normal.  Musculoskeletal:  Right toe hallux valgus  deformity. No pain with ROM of right great toe thru MTP joint. Right 4 toe amputation surgical incision well healed.  Neurological: She is alert and oriented to person, place, and time.  Decreased sensation bilateral feet   Skin: Skin is warm and  dry.  Earleen Newport stage 2 ulceration over medial aspect of right great toe at MTP joint, approximately 1/2 dollar size. Small ulceration over the right great toe tip . No drainage slight malodor. Cellulitis dorsal right foot and distal 1/4 of the lower leg.  Psychiatric: She has a normal mood and affect.    Assessment/Plan: Presbyesophagus Diabetes mellitus with diabetic neuropathy both feet Right foot ulcerations involving the great toe Patient does not appear septic at this time . We will get radiographs of the foot to evaluate for osteomyelitis .  IV antibiotics Soak foot in antibacterial soapsoak tonight for 10 -15 minutes. Xeroform and dry dressing to be applied after soak. Will reevaluate foot , WBC vitals and check results of radiographs in morning.  Explained to patient she may require right foot 1 st ray amputation .  Erskine Emery 08/08/2014, 5:37 PM

## 2014-08-08 NOTE — Progress Notes (Signed)
CM reviewed EPIC notes  CM noted pt being seen at wound care center  CM discussed with EDP that a home health agency can be available to assist with home IV antibiotic infusions if needed

## 2014-08-08 NOTE — ED Provider Notes (Signed)
CSN: [id number]     Arrival date & time 08/08/14  1240 History   First MD Initiated Contact with Patient 08/08/14 1304     Chief Complaint  Patient presents with  . Wound Infection     The history is provided by the patient and medical records. No language interpreter was used.   [name] presents for evaluation of right great toe wound. She has a wound to that toe that has been present since October and she is followed at the wound center.  Over the last two weeks she has had progressive swelling, pain.  She noted drainage at home yesterday.  She was on abx previously.  She was seen at wound care center today and referred to the ED for IV abx.  She denies any SOB, fevers, malaise.  She lives at home alone and has a hx/o CHF and DM.    Past Medical History  Diagnosis Date  . Myocardial infarction   . Coronary artery disease   . Hypertension   . Dysrhythmia   . Peripheral vascular disease   . Hypothyroidism   . Diabetes mellitus   . Presbyesophagus 04/23/2011  . Urinary retention with incomplete bladder emptying 04/23/2011  . UTI (lower urinary tract infection) 04/21/2011  . Diastolic CHF, chronic     Grade I diastolic dysfunction on Echo 04/26/11  . Anaphylaxis 04/25/2011    Thought to be from Rocephin  . History of atrial flutter     ablation   Past Surgical History  Procedure Laterality Date  . Cholecystectomy    . Colonoscopy  01/15/2011    Procedure: COLONOSCOPY;  Surgeon: Beryle Beams, MD;  Location: [facility];  Service: Endoscopy;  Laterality: N/A;  . Amputation Right 04/14/2012    Procedure: AMPUTATION RAY;  Surgeon: Newt Minion, MD;  Location: [facility];  Service: Orthopedics;  Laterality: Right;  ankle block  . Cardiac electrophysiology mapping and ablation    . Nm myocar perf wall motion  01/23/2009    mild ischemia mid inferior & apical inferior.  EF 75%   Family History  Problem Relation Age of Onset  . Cancer Sister   . Diabetes Mother   . Heart disease Father     History  Substance Use Topics  . Smoking status: Former Smoker    Types: Cigarettes    Quit date: 04/20/1981  . Smokeless tobacco: Never Used  . Alcohol Use: Yes     Comment: social   OB History    No data available     Review of Systems  All other systems reviewed and are negative.     Allergies  Ephedrine; Morphine; and Rocephin  Home Medications   Prior to Admission medications   Medication Sig Start Date End Date Taking? Authorizing Provider  aspirin 81 MG tablet Take 81 mg by mouth daily.   Yes Historical Provider, MD  carvedilol (COREG) 6.25 MG tablet Take 6.25 mg by mouth 2 (two) times daily with a meal.     Yes Historical Provider, MD  diltiazem (CARDIZEM CD) 180 MG 24 hr capsule Take 180 mg by mouth at bedtime.    Yes Historical Provider, MD  furosemide (LASIX) 20 MG tablet Take 20 mg by mouth 2 (two) times daily. Take one tablet in the morning and one tablet in the evening.   Yes Historical Provider, MD  gabapentin (NEURONTIN) 300 MG capsule Take 300 mg by mouth 2 (two) times daily. 02/17/12  Yes Bary Leriche, PA-C  insulin aspart (NOVOLOG) 100 UNIT/ML injection Inject 4-8 Units into the skin 3 (three) times daily before meals.    Yes Historical Provider, MD  insulin glargine (LANTUS) 100 UNIT/ML injection Inject 30 Units into the skin every morning.  02/17/12  Yes Ivan Anchors Love, PA-C  isosorbide mononitrate (IMDUR) 30 MG 24 hr tablet Take 30 mg by mouth at bedtime.    Yes Historical Provider, MD  levothyroxine (SYNTHROID, LEVOTHROID) 175 MCG tablet Take 175 mcg by mouth daily before breakfast.   Yes Historical Provider, MD  Multiple Vitamin (MULTIVITAMIN) tablet Take 1 tablet by mouth daily.   Yes Historical Provider, MD  Tamsulosin HCl (FLOMAX) 0.4 MG CAPS Take 0.4 mg by mouth every morning.    Yes Historical Provider, MD  VITAMIN E PO Take 1 capsule by mouth daily.   Yes Historical Provider, MD   BP 138/53 mmHg  Pulse 69  Temp(Src) 97.9 F (36.6 C) (Oral)   SpO2 93% Physical Exam  Constitutional: She is oriented to person, place, and time. She appears well-developed and well-nourished.  HENT:  Head: Normocephalic and atraumatic.  Cardiovascular: Normal rate and regular rhythm.   No murmur heard. Pulmonary/Chest: Effort normal. No respiratory distress.  Bibasilar rales  Abdominal: Soft. There is no tenderness. There is no rebound and no guarding.  Musculoskeletal:  1+ pitting edema of RLE.  Right great toe with 1cm ulceration laterally.  Right first metatarsal head with approx 3cm area of ulceration with moderate surrounding erythema and edema  Neurological: She is alert and oriented to person, place, and time.  Skin: Skin is warm and dry.  Psychiatric: She has a normal mood and affect. Her behavior is normal.  Nursing note and vitals reviewed.   ED Course  Procedures (including critical care time) Labs Review Labs Reviewed  COMPREHENSIVE METABOLIC PANEL - Abnormal; Notable for the following:    Sodium 130 (*)    Potassium 5.2 (*)    Chloride 95 (*)    Glucose, Bld 193 (*)    BUN 30 (*)    Creatinine, Ser 1.83 (*)    Calcium 8.4 (*)    Albumin 2.8 (*)    AST 10 (*)    ALT 8 (*)    Total Bilirubin 0.1 (*)    GFR calc non Af Amer 23 (*)    GFR calc Af Amer 26 (*)    All other components within normal limits  CBC WITH DIFFERENTIAL/PLATELET - Abnormal; Notable for the following:    WBC 11.5 (*)    Hemoglobin 10.3 (*)    HCT 31.8 (*)    Neutro Abs 8.3 (*)    Monocytes Absolute 1.2 (*)    All other components within normal limits  CBG MONITORING, ED - Abnormal; Notable for the following:    Glucose-Capillary 174 (*)    All other components within normal limits    Imaging Review Dg Chest 2 View  08/08/2014   CLINICAL DATA:  Chronic wound involving the right great toe.  EXAM: CHEST  2 VIEW  COMPARISON:  08/27/2013  FINDINGS: The heart is borderline enlarged but stable. The mediastinal and hilar contours are within normal limits  and unchanged. There is mild tortuosity and calcification of the thoracic aorta. There are chronic bronchitic type interstitial lung changes but no definite acute overlying pulmonary process. No pleural effusion. Remote healed rib fractures are noted along with remote thoracic compression fractures.  IMPRESSION: Chronic lung changes but no acute pulmonary findings.   Electronically Signed  By: Marijo Sanes M.D.   On: 08/08/2014 14:39     EKG Interpretation None      MDM   Final diagnoses:  Cellulitis of right lower extremity  Foot ulcer, right, with unspecified severity    Patient here for evaluation of toe wound and IV antibiotics from wound center. Patient is nontoxic appearing on examination. Right lower extremity with 2 ulcerated lesions with significant surrounding cellulitis. CBC with leukocytosis. BMP demonstrates progressive renal insufficiency. Patient started on broad-spectrum antibiotics. She just completed a course of antibiotics yesterday which included doxycycline and a second unknown antibiotic. Discussed with hospitalist regarding admission for IV antibiotics. Ortho consulted.    Discussed with Dr. Ninfa Linden with Orthopedics, the patient will be seen in consult tomorrow, she can have a diet tonight.    Quintella Reichert, MD 08/08/14 1622

## 2014-08-08 NOTE — ED Notes (Signed)
CBG 174  

## 2014-08-08 NOTE — H&P (Addendum)
[facility] History and Physical  [name] DGL:[id number] DOB: January 03, 1920 DOA: 08/08/2014  Referring physician: ED physician, Dr. Ralene Bathe  PCP: Jani Gravel, MD   Chief Complaint: right great toe wound   HPI:  81 y.o. Female with history of right 4th toe amputation by Dr. 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 Pt explains this has first started in October 2015. She was been seen at the wound care center earlier in the day and was advised to go to the ED for IV ABX. Pt denies fevers, chills, no specific abd or urinary concerns.   In ED, pt noted to be hemodynamically stable, VSS, blood work notable for WBC 11.5, K 5.2, Cr 1.83, CBG 193. TRH asked to admit for further evaluation to telemetry bed and ortho team consulted.   Assessment and Plan: Right great toe diabetic ulcer  - diabetic foot ulcers order set in place - will contiue vancomycin and zosyn that was started in ED - ortho team consulted, appreciate assistance  - imaging studies requested to rule out osteo - follow up on ERS, CRP, pre albumin   RLE cellulitis - ABX as noted above  - repeat CBC in AM to follow up on leukocytosis  - keep extremity elevated   Diabetes mellitus with diabetic neuropathy both feet, CKD stage III - IV - continue home medical regimen, check A1C - add SSI for better control - continue neurontin   Anemia of chronic disease, CKD, DM - no signs of active bleeding - CBC in AM  Acute on CKD stage III - IV - last known baseline 10/2013 Cr was ~ 1.5 - will hold lasix today and will provide hydration for about 12 hours  - repeat BMP in AM  Chronic diastolic CHF, grade II per 2 D ECHO 01/2012 - on lasix at home but will hold here for one day as pt with worsening renal failure  - weight on admission  69.8 kg - monitor daily weights, strict I/O  Hyperkalemia - unclear etiology - monitor on tele - repeat BMP In AM  HTN - continue home medical regimen with Imdur, Coreg, Cardizem  Hypothyroidism - continue synthroid   DVT prophylaxis  - Lovenox SQ  Radiological Exams on Admission: Dg Chest 2 View 08/08/2014  Chronic lung changes but no acute pulmonary findings.   Code Status: Full Family Communication: Pt at bedside Disposition Plan: Admit for further evaluation    [facility] [phone]   Review of Systems:  Constitutional: Negative for diaphoresis.  HENT: Negative for hearing loss, ear pain, nosebleeds, congestion, sore throat, neck pain, tinnitus and ear discharge.   Eyes: Negative for blurred vision, double vision, photophobia, pain, discharge and redness.  Respiratory: Negative for cough, hemoptysis, sputum production, shortness of breath, wheezing and stridor.   Cardiovascular: Negative for chest pain, palpitations, orthopnea.  Gastrointestinal: Negative for nausea, abdominal pain.  Genitourinary: Negative for dysuria, urgency, frequency, hematuria and flank pain.  Musculoskeletal: Negative for myalgias, back pain, joint pain and falls.  Skin: Negative for itching and rash.  Neurological: Negative for dizziness and weakness. Endo/Heme/Allergies: Negative for environmental allergies and polydipsia. Does not bruise/bleed easily.  Psychiatric/Behavioral: Negative for suicidal ideas. The patient is not nervous/anxious.      Past Medical History  Diagnosis Date  . Myocardial infarction   .  Coronary artery disease   . Hypertension   . Dysrhythmia   . Peripheral vascular disease   . Hypothyroidism   . Diabetes mellitus   . Presbyesophagus 04/23/2011  . Urinary retention with incomplete bladder emptying 04/23/2011  . UTI (lower urinary tract infection) 04/21/2011  . Diastolic CHF, chronic     Grade I diastolic dysfunction on Echo 04/26/11  . Anaphylaxis 04/25/2011     Thought to be from Rocephin  . History of atrial flutter     ablation    Past Surgical History  Procedure Laterality Date  . Cholecystectomy    . Colonoscopy  01/15/2011    Procedure: COLONOSCOPY;  Surgeon: Beryle Beams, MD;  Location: [facility];  Service: Endoscopy;  Laterality: N/A;  . Amputation Right 04/14/2012    Procedure: AMPUTATION RAY;  Surgeon: Newt Minion, MD;  Location: [facility];  Service: Orthopedics;  Laterality: Right;  ankle block  . Cardiac electrophysiology mapping and ablation    . Nm myocar perf wall motion  01/23/2009    mild ischemia mid inferior & apical inferior.  EF 75%    Social History:  reports that she quit smoking about 33 years ago. Her smoking use included Cigarettes. She has never used smokeless tobacco. She reports that she drinks alcohol. She reports that she does not use illicit drugs.  Allergies  Allergen Reactions  . Ephedrine     Unknown.    . Morphine Other (See Comments)    Difficulty waking up & talking after surgery  . Rocephin [Ceftriaxone Sodium In Dextrose]     Developed acute resp distress, wheezing after 2nd dose.    Family History  Problem Relation Age of Onset  . Cancer Sister   . Diabetes Mother   . Heart disease Father     Prior to Admission medications   Medication Sig Start Date End Date Taking? Authorizing Provider  aspirin 81 MG tablet Take 81 mg by mouth daily.   Yes Historical Provider, MD  carvedilol (COREG) 6.25 MG tablet Take 6.25 mg by mouth 2 (two) times daily with a meal.     Yes Historical Provider, MD  diltiazem (CARDIZEM CD) 180 MG 24 hr capsule Take 180 mg by mouth at bedtime.    Yes Historical Provider, MD  furosemide (LASIX) 20 MG tablet Take 20 mg by mouth 2 (two) times daily. Take one tablet in the morning and one tablet in the evening.   Yes Historical Provider, MD  gabapentin (NEURONTIN) 300 MG capsule Take 300 mg by mouth 2 (two) times daily. 02/17/12  Yes Ivan Anchors Love, PA-C  insulin aspart  (NOVOLOG) 100 UNIT/ML injection Inject 4-8 Units into the skin 3 (three) times daily before meals.    Yes Historical Provider, MD  insulin glargine (LANTUS) 100 UNIT/ML injection Inject 30 Units into the skin every morning.  02/17/12  Yes Ivan Anchors Love, PA-C  isosorbide mononitrate (IMDUR) 30 MG 24 hr tablet Take 30 mg by mouth at bedtime.    Yes Historical Provider, MD  levothyroxine (SYNTHROID, LEVOTHROID) 175 MCG tablet Take 175 mcg by mouth daily before breakfast.   Yes Historical Provider, MD  Multiple Vitamin (MULTIVITAMIN) tablet Take 1 tablet by mouth daily.   Yes Historical Provider, MD  Tamsulosin HCl (FLOMAX) 0.4 MG CAPS Take 0.4 mg by mouth every morning.    Yes Historical Provider, MD  VITAMIN E PO Take 1 capsule by mouth daily.   Yes Historical Provider, MD    Physical Exam:  Filed Vitals:   08/08/14 1258 08/08/14 1513  BP: 138/53 129/58  Pulse: 69 66  Temp: 97.9 F (36.6 C) 97.6 F (36.4 C)  TempSrc: Oral Oral  Resp:  20  SpO2: 93% 97%    Physical Exam  Constitutional: Appears well-developed and well-nourished. No distress.  HENT: Normocephalic. External right and left ear normal. Oropharynx is clear and moist.  Eyes: Conjunctivae and EOM are normal. PERRLA, no scleral icterus.  Neck: Normal ROM. Neck supple. No JVD. No tracheal deviation. No thyromegaly.  CVS: RRR, S1/S2 +, no gallops, no carotid bruit.  Pulmonary: Effort and breath sounds normal, no stridor, rhonchi, wheezes, rales.  Abdominal: Soft. BS +,  no distension, tenderness, rebound or guarding.  Musculoskeletal: Right toe hallux valgus deformity, right 4 toe amputation, ulceration over medial aspect of right great toe at MTP joint, another small ulceration over the right great toe tip, no drainage slight malodor, associated cellulitis dorsal right foot and distal 1/4 of the lower leg Lymphadenopathy: No lymphadenopathy noted, cervical, inguinal. Neuro: Alert. Normal reflexes, muscle tone coordination. No  cranial nerve deficit. Skin: Skin is warm and dry. No rash noted. Not diaphoretic. No erythema. No pallor.  Psychiatric: Normal mood and affect. Behavior, judgment, thought content normal.   Labs on Admission:  Basic Metabolic Panel:  Recent Labs Lab 08/08/14 1411  NA 130*  K 5.2*  CL 95*  CO2 29  GLUCOSE 193*  BUN 30*  CREATININE 1.83*  CALCIUM 8.4*   Liver Function Tests:  Recent Labs Lab 08/08/14 1411  AST 10*  ALT 8*  ALKPHOS 73  BILITOT 0.1*  PROT 7.4  ALBUMIN 2.8*   CBC:  Recent Labs Lab 08/08/14 1411  WBC 11.5*  NEUTROABS 8.3*  HGB 10.3*  HCT 31.8*  MCV 81.3  PLT 386   CBG:  Recent Labs Lab 08/08/14 1302  GLUCAP 174*    EKG: not done yet    If 7PM-7AM, please contact night-coverage [email or web address] Password [facility] 08/08/2014, 3:29 PM

## 2014-08-09 ENCOUNTER — Encounter (HOSPITAL_COMMUNITY): Payer: Self-pay | Admitting: Internal Medicine

## 2014-08-09 DIAGNOSIS — E1142 Type 2 diabetes mellitus with diabetic polyneuropathy: Secondary | ICD-10-CM

## 2014-08-09 DIAGNOSIS — I10 Essential (primary) hypertension: Secondary | ICD-10-CM

## 2014-08-09 DIAGNOSIS — E1169 Type 2 diabetes mellitus with other specified complication: Principal | ICD-10-CM

## 2014-08-09 DIAGNOSIS — E1140 Type 2 diabetes mellitus with diabetic neuropathy, unspecified: Secondary | ICD-10-CM | POA: Diagnosis present

## 2014-08-09 DIAGNOSIS — E875 Hyperkalemia: Secondary | ICD-10-CM

## 2014-08-09 DIAGNOSIS — L03031 Cellulitis of right toe: Secondary | ICD-10-CM

## 2014-08-09 DIAGNOSIS — L02611 Cutaneous abscess of right foot: Secondary | ICD-10-CM

## 2014-08-09 DIAGNOSIS — L089 Local infection of the skin and subcutaneous tissue, unspecified: Secondary | ICD-10-CM

## 2014-08-09 LAB — GLUCOSE, CAPILLARY
GLUCOSE-CAPILLARY: 261 mg/dL — AB (ref 65–99)
Glucose-Capillary: 212 mg/dL — ABNORMAL HIGH (ref 65–99)
Glucose-Capillary: 260 mg/dL — ABNORMAL HIGH (ref 65–99)
Glucose-Capillary: 319 mg/dL — ABNORMAL HIGH (ref 65–99)

## 2014-08-09 LAB — CBC
HCT: 29.1 % — ABNORMAL LOW (ref 36.0–46.0)
Hemoglobin: 9.3 g/dL — ABNORMAL LOW (ref 12.0–15.0)
MCH: 26.4 pg (ref 26.0–34.0)
MCHC: 32 g/dL (ref 30.0–36.0)
MCV: 82.7 fL (ref 78.0–100.0)
Platelets: 401 10*3/uL — ABNORMAL HIGH (ref 150–400)
RBC: 3.52 MIL/uL — AB (ref 3.87–5.11)
RDW: 14.1 % (ref 11.5–15.5)
WBC: 9.6 10*3/uL (ref 4.0–10.5)

## 2014-08-09 LAB — PREALBUMIN: PREALBUMIN: 8.2 mg/dL — AB (ref 18–38)

## 2014-08-09 LAB — C-REACTIVE PROTEIN: CRP: 13.1 mg/dL — AB (ref <1.0)

## 2014-08-09 LAB — BASIC METABOLIC PANEL
ANION GAP: 5 (ref 5–15)
BUN: 29 mg/dL — ABNORMAL HIGH (ref 6–20)
CHLORIDE: 98 mmol/L — AB (ref 101–111)
CO2: 28 mmol/L (ref 22–32)
Calcium: 7.9 mg/dL — ABNORMAL LOW (ref 8.9–10.3)
Creatinine, Ser: 1.82 mg/dL — ABNORMAL HIGH (ref 0.44–1.00)
GFR calc non Af Amer: 23 mL/min — ABNORMAL LOW (ref >60)
GFR, EST AFRICAN AMERICAN: 26 mL/min — AB (ref >60)
Glucose, Bld: 244 mg/dL — ABNORMAL HIGH (ref 65–99)
Potassium: 5 mmol/L (ref 3.5–5.1)
Sodium: 131 mmol/L — ABNORMAL LOW (ref 135–145)

## 2014-08-09 LAB — SEDIMENTATION RATE: Sed Rate: 116 mm/hr — ABNORMAL HIGH (ref 0–22)

## 2014-08-09 MED ORDER — INSULIN ASPART 100 UNIT/ML ~~LOC~~ SOLN
4.0000 [IU] | Freq: Three times a day (TID) | SUBCUTANEOUS | Status: DC
  Administered 2014-08-09 (×2): 4 [IU] via SUBCUTANEOUS

## 2014-08-09 MED ORDER — INSULIN ASPART 100 UNIT/ML ~~LOC~~ SOLN
0.0000 [IU] | Freq: Every day | SUBCUTANEOUS | Status: DC
  Administered 2014-08-09: 22:00:00 via SUBCUTANEOUS

## 2014-08-09 MED ORDER — GLUCERNA SHAKE PO LIQD
237.0000 mL | Freq: Three times a day (TID) | ORAL | Status: DC
  Administered 2014-08-09 – 2014-08-13 (×11): 237 mL via ORAL
  Filled 2014-08-09 (×14): qty 237

## 2014-08-09 MED ORDER — SODIUM CHLORIDE 0.9 % IV SOLN
INTRAVENOUS | Status: DC
  Administered 2014-08-09 – 2014-08-11 (×3): via INTRAVENOUS

## 2014-08-09 MED ORDER — INSULIN ASPART 100 UNIT/ML ~~LOC~~ SOLN
0.0000 [IU] | Freq: Three times a day (TID) | SUBCUTANEOUS | Status: DC
  Administered 2014-08-09 (×2): 8 [IU] via SUBCUTANEOUS

## 2014-08-09 MED ORDER — JUVEN PO PACK
1.0000 | PACK | Freq: Two times a day (BID) | ORAL | Status: DC
  Administered 2014-08-09 – 2014-08-13 (×7): 1 via ORAL
  Filled 2014-08-09 (×9): qty 1

## 2014-08-09 NOTE — Progress Notes (Signed)
CSW received inappropriate consult for "access meds for discharge". RNCM, Cookie aware. CSW noted patient is scheduled for right toe amputation tomorrow.   MD: please order PT evaluation after surgery to determine disposition - return home vs. SNF at discharge. Will need PT evaluation for [employer] insurance authorization if SNF is recommended.      Raynaldo Opitz, [facility] Clinical Social Worker cell #: [phone]

## 2014-08-09 NOTE — Progress Notes (Signed)
Patient ID: [name], female   DOB: 13-Apr-1919, 81 y.o.   MRN: [id number] I spoke with [name] in length as well as her daughter who is POA.  Her right foot has a necrotic wound that it infected.  This involves mainly the 1st ray and there is exposed bone and obvious osteo.  The is associated foot swelling and cellulitis.  My recommendation and plan is to proceed to the OR tomorrow am for surgery on her right foot, which will be an I&D and a 1st ray resection.  Her daughter agrees with this plan.

## 2014-08-09 NOTE — Consult Note (Addendum)
WOC wound consult note Reason for Consult:Diabetic Foot Ulcer IDFU) on right foot with edema and cellulitis Wound type:Neuropathic foot ulcer. Patient has been seen by orthopedics (Dr. Ninfa Linden) and is scheduled for OR tomorrow Pressure Ulcer POA: No There is no role for WOC nursing at this time.  [facility] nursing team will not follow, but will remain available to this patient, the nursing and medical teams.  Please re-consult if needed. Thanks, Maudie Flakes, MSN, RN, [place], [place], [place] [phone])

## 2014-08-09 NOTE — Progress Notes (Signed)
Patient's glucose 327 @ 2145. Doctor paged.

## 2014-08-09 NOTE — Progress Notes (Signed)
Progress Note   [name] NRF:[id number] DOB: 21-Jun-1919 DOA: 08/08/2014 PCP: [name], MD   Brief Narrative:   [name] is an 81 y.o. female with PMH of right fourth toe amputation, type 2 diabetes complicated by neuropathy and stage III-IV chronic kidney disease, hypertension, hypothyroidism who was admitted 08/08/14 with an infected right great toe wound.  Assessment/Plan:   Principal problem  Right great toe diabetic ulcer / right lower extremity cellulitis - Continue vancomycin and zosyn. - Being followed by orthopedic surgery with possible need for right foot first ray amputation. - No definite signs of osteomyelitis on plain films. - ESR elevated at 116, CRP pending.  Active problems Diabetes mellitus with diabetic neuropathy both feet, CKD stage III - IV - Hemoglobin A1c pending. Follow-up diabetes coordinator recommendations. - Currently being managed with insulin sensitive SSI 3 times a day and 30 units of Lantus daily. CBGs elevated at 174-327.  - Change SSI to moderate scale with meal coverage Q AC/HS. - Continue Neurontin for neuropathy.   Anemia of chronic disease, CKD, DM - Hemoglobin stable.  Acute on CKD stage III - IV - Baseline creatinine appears to be around 1.5. Current creatinine 1.8. - Continue to hold Lasix for now.  Chronic diastolic CHF, grade II per 2 D ECHO 01/2012 - Monitor for signs of decompensated heart failure. Continue strict I/O and daily weights.  Hyperkalemia - Likely secondary to dehydration.  HTN - Continue home medical regimen with Imdur, Coreg, Cardizem.  Hypothyroidism - Continue synthroid.   DVT prophylaxis  - Lovenox SQ  Family Communication: No family at the bedside.  [name] is her daughter. Told patient to have the nurse page me if she would like to speak with me when she arrives. Disposition Plan: Home when stable.  Lives alone.  May need SNF. Code Status:     Code Status Orders        Start     Ordered   08/08/14 1709  Full code   Continuous     08/08/14 1720    Advance Directive Documentation        Most Recent Value   Type of Advance Directive  Living will   Pre-existing out of facility DNR order (yellow form or pink MOST form)     "MOST" Form in Place?          IV Access:    Peripheral IV   Procedures and diagnostic studies:   Dg Chest 2 View  08/08/2014   CLINICAL DATA:  Chronic wound involving the right great toe.  EXAM: CHEST  2 VIEW  COMPARISON:  08/27/2013  FINDINGS: The heart is borderline enlarged but stable. The mediastinal and hilar contours are within normal limits and unchanged. There is mild tortuosity and calcification of the thoracic aorta. There are chronic bronchitic type interstitial lung changes but no definite acute overlying pulmonary process. No pleural effusion. Remote healed rib fractures are noted along with remote thoracic compression fractures.  IMPRESSION: Chronic lung changes but no acute pulmonary findings.   Electronically Signed   By: Rudie Meyer M.D.   On: 08/08/2014 14:39   Dg Foot Complete Right  08/08/2014   CLINICAL DATA:  81 year old female with with redness of the right foot and ulcer on the great toe.  EXAM: RIGHT FOOT COMPLETE - 3+ VIEW  COMPARISON:  Radiograph dated 07/04/2014  FINDINGS: There is stable appearing amputation of the fourth toe. There is an old fracture deformity  of the third metatarsal. No acute fracture or dislocation identified. There is mild hallux valgus. There is diffuse osteopenia.  There is diffuse soft tissue swelling and edema. A skin ulcer is noted along the medial aspect of the great toe. An ulcer is also noted adjacent to the first metatarsophalangeal joint. No definite periosteal reaction or radiographic signs of osteomyelitis identified. MRI or white blood cell nuclear scan may provide better evaluation if there is high clinical suspicion for acute osteomyelitis.  IMPRESSION: Diffuse soft tissue  swelling with skin ulcers along the medial aspect of the great toe. No definite radiographic signs of osteomyelitis. MRI may provide better evaluation if clinically indicated   Electronically Signed   By: Anner Crete M.D.   On: 08/08/2014 19:25     Medical Consultants:    Orthopedic Surgery  Anti-Infectives:    Vancomycin 08/08/14--->  Zosyn 08/08/14--->  Subjective:   [name] denies nausea, vomiting, diarrhea, foot pain.  Appetite is fair.  Last BM was earlier today.   Objective:    Filed Vitals:   08/08/14 1615 08/08/14 1721 08/08/14 2142 08/09/14 0457  BP: 139/77 151/55 135/98 109/52  Pulse: 69 71 76 72  Temp:  97.7 F (36.5 C) 98.4 F (36.9 C) 98.4 F (36.9 C)  TempSrc:  Oral Oral Axillary  Resp: $Remo'19 20 20 18  'zQeGh$ Height:  $Remove'5\' 8"'uXbQJTv$  (1.727 m)    Weight:    68.8 kg (151 lb 10.8 oz)  SpO2: 93% 97% 93% 97%    Intake/Output Summary (Last 24 hours) at 08/09/14 0800 Last data filed at 08/09/14 0700  Gross per 24 hour  Intake  362.5 ml  Output      0 ml  Net  362.5 ml    Exam: Gen:  NAD Cardiovascular:  RRR, No M/R/G Respiratory:  Lungs CTAB Gastrointestinal:  Abdomen soft, NT/ND, + BS Extremities:  RLE swollen compared to left, right great toe as pictured below:      Data Reviewed:    Labs: Basic Metabolic Panel:  Recent Labs Lab 08/08/14 1411 08/09/14 0513  NA 130* 131*  K 5.2* 5.0  CL 95* 98*  CO2 29 28  GLUCOSE 193* 244*  BUN 30* 29*  CREATININE 1.83* 1.82*  CALCIUM 8.4* 7.9*   GFR Estimated Creatinine Clearance: 19.5 mL/min (by C-G formula based on Cr of 1.82). Liver Function Tests:  Recent Labs Lab 08/08/14 1411  AST 10*  ALT 8*  ALKPHOS 73  BILITOT 0.1*  PROT 7.4  ALBUMIN 2.8*   CBC:  Recent Labs Lab 08/08/14 1411 08/09/14 0513  WBC 11.5* 9.6  NEUTROABS 8.3*  --   HGB 10.3* 9.3*  HCT 31.8* 29.1*  MCV 81.3 82.7  PLT 386 401*   BNP (last 3 results)  Recent Labs  08/27/13 2127  PROBNP 231.0    CBG:  Recent Labs Lab 08/08/14 1302 08/08/14 2145 08/09/14 0756  GLUCAP 174* 327* 212*    Microbiology No results found for this or any previous visit (from the past 240 hour(s)).   Medications:   . aspirin EC  81 mg Oral Daily  . carvedilol  6.25 mg Oral BID WC  . diltiazem  180 mg Oral QHS  . enoxaparin (LOVENOX) injection  30 mg Subcutaneous Q24H  . gabapentin  300 mg Oral BID  . insulin aspart  0-9 Units Subcutaneous TID WC  . insulin glargine  30 Units Subcutaneous q morning - 10a  . isosorbide mononitrate  30 mg Oral QHS  .  levothyroxine  175 mcg Oral QAC breakfast  . piperacillin-tazobactam (ZOSYN)  IV  3.375 g Intravenous Q8H  . tamsulosin  0.4 mg Oral q morning - 10a  . vancomycin  750 mg Intravenous Q24H   Continuous Infusions:   Time spent: 35 minutes with > 50% of time discussing current diagnostic test results, clinical impression and plan of care.   LOS: 1 day   Rozina Pointer  [facility] Pager [phone]. If unable to reach me by pager, please call my cell phone at [phone].  *Please refer to [email or web address], password [email or web address] to get updated schedule on who will round on this patient, as hospitalists switch teams weekly. If 7PM-7AM, please contact night-coverage at [email or web address], password [email or web address] for any overnight needs.  08/09/2014, 8:00 AM

## 2014-08-09 NOTE — Progress Notes (Signed)
Initial Nutrition Assessment   INTERVENTION:   Provide Glucerna Shake po TID, each supplement provides 220 kcal and 10 grams of protein Provide Juven BID, each packet provides 80 kcal and 14g of protein. Encourage PO intake RD to continue to monitor  NUTRITION DIAGNOSIS:   Increased nutrient needs related to wound healing as evidenced by estimated needs.  GOAL:   Patient will meet greater than or equal to 90% of their needs  MONITOR:   PO intake, Supplement acceptance, Labs, Weight trends, Skin, I & O's  REASON FOR ASSESSMENT:   Consult Wound healing  ASSESSMENT:   81 year old female with right foot ulcerations great toe since October of 2015. She has been being treated at wound care center for these ulcerations.   Pt reports eating well with no appetite changes. PO intake: 100%. Pt with steady weight loss but insignificant for time frame.  Pt willing to try Glucerna shakes and protein supplement (Juven). RD to order.  Nutrition-Focused physical exam completed. Findings are no fat depletion, mild muscle depletion, and severe RLE edema.   Labs reviewed: CBGs: 212-327 Low Na Elevated BUN & Creatinine  Diet Order:  Diet Carb Modified Fluid consistency:: Thin; Room service appropriate?: Yes Diet NPO time specified  Skin:  Wound (see comment) (diabetic foot ulcers)  Last BM:  7/14  Height:   Ht Readings from Last 1 Encounters:  08/08/14 5\' 8"  (1.727 m)    Weight:   Wt Readings from Last 1 Encounters:  08/09/14 151 lb 10.8 oz (68.8 kg)    Ideal Body Weight:  63.6 kg  Wt Readings from Last 10 Encounters:  08/09/14 151 lb 10.8 oz (68.8 kg)  07/19/14 154 lb (69.854 kg)  11/18/13 164 lb 8 oz (74.617 kg)  10/02/13 166 lb 12.8 oz (75.66 kg)  09/12/13 166 lb 9.6 oz (75.569 kg)  08/28/13 162 lb 11.2 oz (73.8 kg)  11/01/12 160 lb 11.2 oz (72.893 kg)  05/29/12 173 lb 9.6 oz (78.744 kg)  05/01/12 177 lb (80.287 kg)  04/19/12 171 lb (77.565 kg)    BMI:  Body  mass index is 23.07 kg/(m^2).  Estimated Nutritional Needs:   Kcal:  2620-3559  Protein:  85-95g  Fluid:  1.8L/day  EDUCATION NEEDS:   Education needs addressed  Clayton Bibles, MS, RD, LDN Pager: [phone] After Hours Pager: [phone]

## 2014-08-10 ENCOUNTER — Inpatient Hospital Stay (HOSPITAL_COMMUNITY): Payer: PPO | Admitting: Anesthesiology

## 2014-08-10 ENCOUNTER — Encounter (HOSPITAL_COMMUNITY): Payer: Self-pay | Admitting: Anesthesiology

## 2014-08-10 ENCOUNTER — Encounter (HOSPITAL_COMMUNITY)
Admission: EM | Disposition: A | Discharge status: Skilled Nursing Facility | Payer: Self-pay | Source: Home / Self Care | Attending: Internal Medicine

## 2014-08-10 HISTORY — PX: AMPUTATION: SHX166

## 2014-08-10 LAB — BASIC METABOLIC PANEL
ANION GAP: 7 (ref 5–15)
BUN: 29 mg/dL — ABNORMAL HIGH (ref 6–20)
CO2: 26 mmol/L (ref 22–32)
Calcium: 7.8 mg/dL — ABNORMAL LOW (ref 8.9–10.3)
Chloride: 98 mmol/L — ABNORMAL LOW (ref 101–111)
Creatinine, Ser: 1.73 mg/dL — ABNORMAL HIGH (ref 0.44–1.00)
GFR calc Af Amer: 28 mL/min — ABNORMAL LOW (ref >60)
GFR calc non Af Amer: 24 mL/min — ABNORMAL LOW (ref >60)
Glucose, Bld: 356 mg/dL — ABNORMAL HIGH (ref 65–99)
Potassium: 5.4 mmol/L — ABNORMAL HIGH (ref 3.5–5.1)
SODIUM: 131 mmol/L — AB (ref 135–145)

## 2014-08-10 LAB — HEMOGLOBIN A1C
Hgb A1c MFr Bld: 10.1 % — ABNORMAL HIGH (ref 4.8–5.6)
Mean Plasma Glucose: 243 mg/dL

## 2014-08-10 LAB — GLUCOSE, CAPILLARY
GLUCOSE-CAPILLARY: 350 mg/dL — AB (ref 65–99)
GLUCOSE-CAPILLARY: 208 mg/dL — AB (ref 65–99)
Glucose-Capillary: 223 mg/dL — ABNORMAL HIGH (ref 65–99)
Glucose-Capillary: 165 mg/dL — ABNORMAL HIGH (ref 65–99)

## 2014-08-10 SURGERY — AMPUTATION, FOOT, RAY
Anesthesia: Regional | Site: Foot | Laterality: Right

## 2014-08-10 MED ORDER — GLYCOPYRROLATE 0.2 MG/ML IJ SOLN
INTRAMUSCULAR | Status: AC
  Filled 2014-08-10: qty 1

## 2014-08-10 MED ORDER — MEPERIDINE HCL 25 MG/ML IJ SOLN
6.2500 mg | INTRAMUSCULAR | Status: DC | PRN
  Filled 2014-08-10: qty 1

## 2014-08-10 MED ORDER — ROPIVACAINE HCL 5 MG/ML IJ SOLN
INTRAMUSCULAR | Status: DC | PRN
  Administered 2014-08-10: 20 mL via EPIDURAL

## 2014-08-10 MED ORDER — LIDOCAINE-EPINEPHRINE (PF) 2 %-1:200000 IJ SOLN
INTRAMUSCULAR | Status: AC
  Filled 2014-08-10: qty 20

## 2014-08-10 MED ORDER — ROCURONIUM BROMIDE 100 MG/10ML IV SOLN
INTRAVENOUS | Status: AC
  Filled 2014-08-10: qty 1

## 2014-08-10 MED ORDER — MIDAZOLAM HCL 2 MG/2ML IJ SOLN
INTRAMUSCULAR | Status: AC
  Filled 2014-08-10: qty 2

## 2014-08-10 MED ORDER — LIDOCAINE-EPINEPHRINE (PF) 2 %-1:200000 IJ SOLN
INTRAMUSCULAR | Status: DC | PRN
  Administered 2014-08-10: 20 mL via INTRADERMAL

## 2014-08-10 MED ORDER — ONDANSETRON HCL 4 MG/2ML IJ SOLN
INTRAMUSCULAR | Status: AC
  Filled 2014-08-10: qty 2

## 2014-08-10 MED ORDER — FENTANYL CITRATE (PF) 100 MCG/2ML IJ SOLN
INTRAMUSCULAR | Status: AC
  Filled 2014-08-10: qty 2

## 2014-08-10 MED ORDER — LIDOCAINE HCL (CARDIAC) 20 MG/ML IV SOLN
INTRAVENOUS | Status: AC
  Filled 2014-08-10: qty 5

## 2014-08-10 MED ORDER — INSULIN ASPART 100 UNIT/ML ~~LOC~~ SOLN
0.0000 [IU] | Freq: Three times a day (TID) | SUBCUTANEOUS | Status: DC
  Administered 2014-08-10 (×2): 7 [IU] via SUBCUTANEOUS
  Administered 2014-08-10: 3 [IU] via SUBCUTANEOUS

## 2014-08-10 MED ORDER — SODIUM POLYSTYRENE SULFONATE 15 GM/60ML PO SUSP
15.0000 g | Freq: Once | ORAL | Status: AC
  Administered 2014-08-10: 15 g via ORAL
  Filled 2014-08-10: qty 60

## 2014-08-10 MED ORDER — 0.9 % SODIUM CHLORIDE (POUR BTL) OPTIME
TOPICAL | Status: DC | PRN
  Administered 2014-08-10: 1000 mL

## 2014-08-10 MED ORDER — ONDANSETRON HCL 4 MG/2ML IJ SOLN: 4.0000 mg | Freq: Once | INTRAMUSCULAR | Status: AC | PRN

## 2014-08-10 MED ORDER — MIDAZOLAM HCL 5 MG/5ML IJ SOLN
INTRAMUSCULAR | Status: DC | PRN
  Administered 2014-08-10 (×2): 0.5 mg via INTRAVENOUS

## 2014-08-10 MED ORDER — FENTANYL CITRATE (PF) 100 MCG/2ML IJ SOLN: 25.0000 ug | INTRAMUSCULAR | Status: DC | PRN

## 2014-08-10 MED ORDER — PROPOFOL 10 MG/ML IV BOLUS
INTRAVENOUS | Status: AC
  Filled 2014-08-10: qty 20

## 2014-08-10 MED ORDER — INSULIN ASPART 100 UNIT/ML ~~LOC~~ SOLN
SUBCUTANEOUS | Status: AC
  Administered 2014-08-10: 3 [IU] via SUBCUTANEOUS
  Filled 2014-08-10: qty 1

## 2014-08-10 MED ORDER — ROPIVACAINE HCL 5 MG/ML IJ SOLN
INTRAMUSCULAR | Status: AC
  Filled 2014-08-10: qty 30

## 2014-08-10 MED ORDER — FENTANYL CITRATE (PF) 100 MCG/2ML IJ SOLN
INTRAMUSCULAR | Status: DC | PRN
  Administered 2014-08-10: 50 ug via INTRAVENOUS

## 2014-08-10 MED ORDER — INSULIN ASPART 100 UNIT/ML ~~LOC~~ SOLN: 0.0000 [IU] | Freq: Every day | SUBCUTANEOUS | Status: DC

## 2014-08-10 MED ORDER — PROPOFOL INFUSION 10 MG/ML OPTIME
INTRAVENOUS | Status: DC | PRN
  Administered 2014-08-10: 50 ug/kg/min via INTRAVENOUS

## 2014-08-10 MED ORDER — INSULIN ASPART 100 UNIT/ML ~~LOC~~ SOLN
6.0000 [IU] | Freq: Three times a day (TID) | SUBCUTANEOUS | Status: DC
  Administered 2014-08-10 – 2014-08-13 (×8): 6 [IU] via SUBCUTANEOUS

## 2014-08-10 MED ORDER — LIDOCAINE-EPINEPHRINE 2 %-1:100000 IJ SOLN
INTRAMUSCULAR | Status: AC
  Filled 2014-08-10: qty 1

## 2014-08-10 SURGICAL SUPPLY — 40 items
BAG SPEC THK2 15X12 ZIP CLS (MISCELLANEOUS) ×1
BAG ZIPLOCK 12X15 (MISCELLANEOUS) ×3 IMPLANT
BANDAGE ELASTIC 4 VELCRO ST LF (GAUZE/BANDAGES/DRESSINGS) ×1 IMPLANT
BANDAGE ELASTIC 6 VELCRO ST LF (GAUZE/BANDAGES/DRESSINGS) IMPLANT
BANDAGE ESMARK 6X9 LF (GAUZE/BANDAGES/DRESSINGS) ×1 IMPLANT
BLADE MIC 41X13 (BLADE) ×2 IMPLANT
BLADE MIC 41X13MM (BLADE) ×1
BNDG CMPR 9X6 STRL LF SNTH (GAUZE/BANDAGES/DRESSINGS) ×1
BNDG COHESIVE 4X5 TAN STRL (GAUZE/BANDAGES/DRESSINGS) ×3 IMPLANT
BNDG ESMARK 6X9 LF (GAUZE/BANDAGES/DRESSINGS) ×3
BNDG GAUZE ELAST 4 BULKY (GAUZE/BANDAGES/DRESSINGS) ×3 IMPLANT
CUFF TOURN SGL QUICK 34 (TOURNIQUET CUFF)
CUFF TRNQT CYL 34X4X40X1 (TOURNIQUET CUFF) ×1 IMPLANT
DRAPE U-SHAPE 47X51 STRL (DRAPES) ×3 IMPLANT
DURAPREP 26ML APPLICATOR (WOUND CARE) ×3 IMPLANT
ELECT REM PT RETURN 9FT ADLT (ELECTROSURGICAL) ×3
ELECTRODE REM PT RTRN 9FT ADLT (ELECTROSURGICAL) ×1 IMPLANT
GAUZE SPONGE 4X4 12PLY STRL (GAUZE/BANDAGES/DRESSINGS) ×3 IMPLANT
GAUZE XEROFORM 1X8 LF (GAUZE/BANDAGES/DRESSINGS) ×3 IMPLANT
GAUZE XEROFORM 5X9 LF (GAUZE/BANDAGES/DRESSINGS) ×3 IMPLANT
GLOVE BIOGEL PI IND STRL 8 (GLOVE) ×1 IMPLANT
GLOVE BIOGEL PI INDICATOR 8 (GLOVE) ×2
GLOVE ECLIPSE 8.0 STRL XLNG CF (GLOVE) ×3 IMPLANT
GLOVE ORTHO TXT STRL SZ7.5 (GLOVE) ×3 IMPLANT
GOWN STRL REUS W/TWL XL LVL3 (GOWN DISPOSABLE) ×3 IMPLANT
KIT BASIN OR (CUSTOM PROCEDURE TRAY) ×3 IMPLANT
NS IRRIG 1000ML POUR BTL (IV SOLUTION) ×3 IMPLANT
PACK LOWER EXTREMITY WL (CUSTOM PROCEDURE TRAY) ×2 IMPLANT
PACK TOTAL JOINT (CUSTOM PROCEDURE TRAY) ×1 IMPLANT
PAD ABD 8X10 STRL (GAUZE/BANDAGES/DRESSINGS) ×2 IMPLANT
PAD CAST 4YDX4 CTTN HI CHSV (CAST SUPPLIES) ×1 IMPLANT
PADDING CAST COTTON 4X4 STRL (CAST SUPPLIES) ×3
POSITIONER SURGICAL ARM (MISCELLANEOUS) ×3 IMPLANT
SPONGE LAP 18X18 X RAY DECT (DISPOSABLE) ×4 IMPLANT
STAPLER VISISTAT 35W (STAPLE) ×3 IMPLANT
STOCKINETTE 8 INCH (MISCELLANEOUS) ×3 IMPLANT
SUT ETHILON 2 0 PSLX (SUTURE) ×6 IMPLANT
TOWEL OR 17X26 10 PK STRL BLUE (TOWEL DISPOSABLE) ×5 IMPLANT
WATER STERILE IRR 1500ML POUR (IV SOLUTION) ×1 IMPLANT
YANKAUER SUCT BULB TIP NO VENT (SUCTIONS) ×3 IMPLANT

## 2014-08-10 NOTE — Progress Notes (Signed)
PT Cancellation Note  Patient Details Name: DIANELYS SCINTO MRN: [id number] DOB: Jul 06, 1919   Cancelled Treatment:    Reason Eval/Treat Not Completed: Received order today. Noted pt had surgery this am. Will likely f/u tomorrow for PT eval. Thanks.    Weston Anna, MPT Pager: [phone]

## 2014-08-10 NOTE — Transfer of Care (Signed)
Immediate Anesthesia Transfer of Care Note  Patient: [name]  Procedure(s) Performed: Procedure(s) with comments: AMPUTATION RAY (Right) - ankle block  Patient Location: PACU  Anesthesia Type:MAC  Level of Consciousness:  sedated, patient cooperative and responds to stimulation  Airway & Oxygen Therapy:Patient Spontanous Breathing and Patient connected to face mask oxgen  Post-op Assessment:  Report given to PACU RN and Post -op Vital signs reviewed and stable  Post vital signs:  Reviewed and stable  Last Vitals:  Filed Vitals:   08/10/14 0815  BP: 121/54  Pulse: 69  Temp: 36.8 C  Resp: 21    Complications: No apparent anesthesia complications

## 2014-08-10 NOTE — Care Management Note (Signed)
Case Management Note  Patient Details  Name: [name] MRN: [id number] Date of Birth: 1919/06/16  Subjective/Objective:    Toe amputation                 Action/Plan:  Discharge planning Expected Discharge Date:   (unknown)               Expected Discharge Plan:     In-House Referral:  Clinical Social Work  Discharge planning Services  CM Consult  Post Acute Care Choice:    Choice offered to:     DME Arranged:    DME Agency:     HH Arranged:    HH Agency:     Status of Service:  In process, will continue to follow  Medicare Important Message Given:    Date Medicare IM Given:    Medicare IM give by:    Date Additional Medicare IM Given:    Additional Medicare Important Message give by:     If discussed at [place] of Stay Meetings, dates discussed:    Additional Comments:  CM spoke with the patient at the bedside. Reports she has a caregiver from 9 AM to 4 PM daily. She is at home alone after 4 PM until the following morning at 9 AM. She is in a wheelchair when the caregiver leaves at 4 PM. Transfers herself to the bed from the wheelchair. Reports she has fallen in the past when transferring from the bed to wheelchair and stayed on the floor until the caregiver found her the following morning. She leaves a space between the wheelchair and bed when she transfers in an out of bed. She is trying to remain in her home for as long as possible. She has a daughter here but she does not stay with her. Has a rolling walker at home. She is unsure whether or not she has a 3N1. She is open to short-term inpatient rehab/SNF placement if ordered. SW referral completed.  Apolonio Schneiders, RN 08/10/2014, 1:02 PM

## 2014-08-10 NOTE — Progress Notes (Signed)
Inpatient Diabetes Program Recommendations  AACE/ADA: New Consensus Statement on Inpatient Glycemic Control (2013)  Target Ranges:  Prepandial:   less than 140 mg/dL      Peak postprandial:   less than 180 mg/dL (1-2 hours)      Critically ill patients:  140 - 180 mg/dL    Results for HELON, WISINSKI (MRN [id number]) as of 08/10/2014 11:01  Ref. Range 08/09/2014 07:56 08/09/2014 11:40 08/09/2014 16:49 08/09/2014 21:30 08/10/2014 06:05  Glucose-Capillary Latest Ref Range: 65-99 mg/dL 212 (H) 261 (H) 260 (H) 319 (H) 350 (H)   Reason for Admission: Right great toe ulcer/cellulitis  Diabetes history: DM 2 Outpatient Diabetes medications: Lantus 30 units Daily, Novolog 4-8 units TID with meals Current orders for Inpatient glycemic control: Lantus 30 units Daily, Novolog Resistant (0-20 units) + HS scale, Novolog 6 units TID meal coverage  Inpatient Diabetes Program Recommendations  Insulin - Basal: Patient received a total of 36 units of Novolog yesterday 7/15. Please consider increasing basal insulin to 40-45 units Daily. If already received today please consider ordering an additional dose of basal to make up the difference.  Correction (SSI): If basal increased, please reduce correction to Novolog Moderate scale (0-25 units) TID.  Note: Patient also on Glucerna supplementation with 23 grams of carbs in each drink. Patient to OR this am for I&D.  Thanks,  Tama Headings RN, MSN, [facility] Inpatient Diabetes Coordinator Team Pager [phone]

## 2014-08-10 NOTE — Anesthesia Preprocedure Evaluation (Addendum)
Anesthesia Evaluation  Patient identified by MRN, date of birth, ID band Patient awake    Reviewed: Allergy & Precautions, H&P , Patient's Chart, lab work & pertinent test results, reviewed documented beta blocker date and time   Airway Mallampati: II  TM Distance: >3 FB Neck ROM: full    Dental no notable dental hx.    Pulmonary former smoker,  breath sounds clear to auscultation  Pulmonary exam normal       Cardiovascular hypertension, Rhythm:regular Rate:Normal     Neuro/Psych    GI/Hepatic   Endo/Other  diabetes  Renal/GU      Musculoskeletal   Abdominal   Peds  Hematology   Anesthesia Other Findings Myocardial infarction   Coronary artery disease    Hypertension   Dysrhythmia   Peripheral vascular disease   Hypothyroidism   Diabetes mellitus..... Am BS 350  Hyper  K+... Mild  CKD  Urinary retention with incomplete bladder emptying   Reproductive/Obstetrics                           Anesthesia Physical Anesthesia Plan  ASA: III  Anesthesia Plan: Regional   Post-op Pain Management:    Induction: Intravenous  Airway Management Planned: LMA  Additional Equipment:   Intra-op Plan:   Post-operative Plan:   Informed Consent: I have reviewed the patients History and Physical, chart, labs and discussed the procedure including the risks, benefits and alternatives for the proposed anesthesia with the patient or authorized representative who has indicated his/her understanding and acceptance.   Dental Advisory Given and Dental advisory given  Plan Discussed with: CRNA and Surgeon  Anesthesia Plan Comments: (Plan ankle block and if needed, also discussed GA with LMA, possible sore throat, potential need to switch to ETT, N/V, pulmonary aspiration. Questions answered. )       Anesthesia Quick Evaluation

## 2014-08-10 NOTE — Progress Notes (Signed)
Progress Note   [name] OXB:[id number] DOB: Feb 03, 1919 DOA: 08/08/2014 PCP: Jani Gravel, MD   Brief Narrative:   [name] is an 81 y.o. female with PMH of right fourth toe amputation, type 2 diabetes complicated by neuropathy and stage III-IV chronic kidney disease, hypertension, hypothyroidism who was admitted 08/08/14 with an infected right great toe wound.  Assessment/Plan:   Principal problem  Right great toe diabetic ulcer / right lower extremity cellulitis - Continue vancomycin and zosyn. - Being followed by orthopedic surgery, s/p  right foot first ray amputation today. - Mobilize with PT.  Active problems Hyperkalemia - Kayexalate 15 g today.  Diabetes mellitus with diabetic neuropathy both feet, CKD stage III - IV - Hemoglobin 10.1. Follow-up diabetes coordinator recommendations. - Currently being managed with moderate scale SSI Q AC/HS and 4 units meal coverage, and 30 units of Lantus daily.  - CBGs elevated at 212-350. Change to insulin resistant scale/meal coverage. - Continue Neurontin for neuropathy.   Anemia of chronic disease, CKD, DM - Hemoglobin stable.  Acute on CKD stage III - IV - Baseline creatinine appears to be around 1.5. Current creatinine 1.73. - Continue to hold Lasix for now.  Chronic diastolic CHF, grade II per 2 D ECHO 01/2012 - Monitor for signs of decompensated heart failure. Continue strict I/O and daily weights.  Hyperkalemia - Likely secondary to dehydration.  HTN - Continue home medical regimen with Imdur, Coreg, Cardizem.  Hypothyroidism - Continue synthroid.   DVT prophylaxis  - Lovenox SQ  Family Communication: No family at the bedside.  [name] is her daughter. Told patient to have the nurse page me if she would like to speak with me when she arrives. Disposition Plan: SNF when bed available. Code Status:     Code Status Orders        Start     Ordered   08/08/14 1709  Full code   Continuous      08/08/14 1720    Advance Directive Documentation        Most Recent Value   Type of Advance Directive  Living will   Pre-existing out of facility DNR order (yellow form or pink MOST form)     "MOST" Form in Place?          IV Access:    Peripheral IV   Procedures and diagnostic studies:   Dg Chest 2 View  08/08/2014   CLINICAL DATA:  Chronic wound involving the right great toe.  EXAM: CHEST  2 VIEW  COMPARISON:  08/27/2013  FINDINGS: The heart is borderline enlarged but stable. The mediastinal and hilar contours are within normal limits and unchanged. There is mild tortuosity and calcification of the thoracic aorta. There are chronic bronchitic type interstitial lung changes but no definite acute overlying pulmonary process. No pleural effusion. Remote healed rib fractures are noted along with remote thoracic compression fractures.  IMPRESSION: Chronic lung changes but no acute pulmonary findings.   Electronically Signed   By: Marijo Sanes M.D.   On: 08/08/2014 14:39   Dg Foot Complete Right  08/08/2014   CLINICAL DATA:  81 year old female with with redness of the right foot and ulcer on the great toe.  EXAM: RIGHT FOOT COMPLETE - 3+ VIEW  COMPARISON:  Radiograph dated 07/04/2014  FINDINGS: There is stable appearing amputation of the fourth toe. There is an old fracture deformity of the third metatarsal. No acute fracture or dislocation identified. There is mild  hallux valgus. There is diffuse osteopenia.  There is diffuse soft tissue swelling and edema. A skin ulcer is noted along the medial aspect of the great toe. An ulcer is also noted adjacent to the first metatarsophalangeal joint. No definite periosteal reaction or radiographic signs of osteomyelitis identified. MRI or white blood cell nuclear scan may provide better evaluation if there is high clinical suspicion for acute osteomyelitis.  IMPRESSION: Diffuse soft tissue swelling with skin ulcers along the medial aspect of the  great toe. No definite radiographic signs of osteomyelitis. MRI may provide better evaluation if clinically indicated   Electronically Signed   By: Anner Crete M.D.   On: 08/08/2014 19:25     Medical Consultants:    Orthopedic Surgery  Anti-Infectives:    Vancomycin 08/08/14--->  Zosyn 08/08/14--->  Subjective:   [name] continues to deny nausea, vomiting, diarrhea, foot pain.  Appetite good.    Objective:    Filed Vitals:   08/09/14 1358 08/09/14 1745 08/09/14 2043 08/10/14 0445  BP: 106/79 144/79 141/37 131/49  Pulse: 68 74 72 75  Temp: 97.6 F (36.4 C)  98.1 F (36.7 C) 98.3 F (36.8 C)  TempSrc: Oral  Oral Oral  Resp: 20  18 18   Height:      Weight:    78.2 kg (172 lb 6.4 oz)  SpO2: 96%  94% 97%    Intake/Output Summary (Last 24 hours) at 08/10/14 0800 Last data filed at 08/10/14 0755  Gross per 24 hour  Intake 1927.5 ml  Output    400 ml  Net 1527.5 ml    Exam: Gen:  NAD Cardiovascular:  RRR, No M/R/G Respiratory:  Lungs CTAB Gastrointestinal:  Abdomen soft, NT/ND, + BS Extremities:  RLE swollen compared to left, right foot bandaged   Data Reviewed:    Labs: Basic Metabolic Panel:  Recent Labs Lab 08/08/14 1411 08/09/14 0513 08/10/14 0413  NA 130* 131* 131*  K 5.2* 5.0 5.4*  CL 95* 98* 98*  CO2 29 28 26   GLUCOSE 193* 244* 356*  BUN 30* 29* 29*  CREATININE 1.83* 1.82* 1.73*  CALCIUM 8.4* 7.9* 7.8*   GFR Estimated Creatinine Clearance: 22.3 mL/min (by C-G formula based on Cr of 1.73). Liver Function Tests:  Recent Labs Lab 08/08/14 1411  AST 10*  ALT 8*  ALKPHOS 73  BILITOT 0.1*  PROT 7.4  ALBUMIN 2.8*   CBC:  Recent Labs Lab 08/08/14 1411 08/09/14 0513  WBC 11.5* 9.6  NEUTROABS 8.3*  --   HGB 10.3* 9.3*  HCT 31.8* 29.1*  MCV 81.3 82.7  PLT 386 401*   BNP (last 3 results)  Recent Labs  08/27/13 2127  PROBNP 231.0   CBG:  Recent Labs Lab 08/09/14 0756 08/09/14 1140 08/09/14 1649  08/09/14 2130 08/10/14 0605  GLUCAP 212* 261* 260* 319* 350*    Microbiology Recent Results (from the past 240 hour(s))  Culture, blood (routine x 2)     Status: None (Preliminary result)   Collection Time: 08/08/14  6:35 PM  Result Value Ref Range Status   Specimen Description BLOOD BLOOD RIGHT ARM  Final   Special Requests BOTTLES DRAWN AEROBIC AND ANAEROBIC 10CC  Final   Culture   Final    NO GROWTH < 24 HOURS Performed at [facility]    Report Status PENDING  Incomplete     Medications:   . aspirin EC  81 mg Oral Daily  . carvedilol  6.25 mg Oral BID WC  .  diltiazem  180 mg Oral QHS  . enoxaparin (LOVENOX) injection  30 mg Subcutaneous Q24H  . feeding supplement (GLUCERNA SHAKE)  237 mL Oral TID BM  . gabapentin  300 mg Oral BID  . insulin aspart  0-15 Units Subcutaneous TID WC  . insulin aspart  0-5 Units Subcutaneous QHS  . insulin aspart  4 Units Subcutaneous TID WC  . insulin glargine  30 Units Subcutaneous q morning - 10a  . isosorbide mononitrate  30 mg Oral QHS  . levothyroxine  175 mcg Oral QAC breakfast  . nutrition supplement (JUVEN)  1 packet Oral BID WC  . piperacillin-tazobactam (ZOSYN)  IV  3.375 g Intravenous Q8H  . tamsulosin  0.4 mg Oral q morning - 10a  . vancomycin  750 mg Intravenous Q24H   Continuous Infusions: . sodium chloride 50 mL/hr at 08/09/14 1551    Time spent: 25 minutes.   LOS: 2 days   Hurshel Bouillon  [facility] Pager [phone]. If unable to reach me by pager, please call my cell phone at [phone].  *Please refer to [email or web address], password [email or web address] to get updated schedule on who will round on this patient, as hospitalists switch teams weekly. If 7PM-7AM, please contact night-coverage at [email or web address], password [email or web address] for any overnight needs.  08/10/2014, 8:00 AM

## 2014-08-10 NOTE — Op Note (Signed)
NAMEAYSHA, LIVECCHI NO.:  [id number]  MEDICAL RECORD NO.:  [id number]  LOCATION:  [place]                         FACILITY:  [facility]  PHYSICIAN:  Lind Guest. Ninfa Linden, M.D.DATE OF BIRTH:  20-Nov-1919  DATE OF PROCEDURE:  08/10/2014 DATE OF DISCHARGE:                              OPERATIVE REPORT   PREOPERATIVE DIAGNOSIS:  Chronic right foot wound over the first ray with necrosis, infection, associated cellulitis and osteomyelitis of the metatarsal head.  POSTOPERATIVE DIAGNOSIS:  Chronic right foot wound over the first ray with necrosis, infection, associated cellulitis and osteomyelitis of the metatarsal head.  PROCEDURE:  Irrigation and debridement of right foot with first ray amputation through the first metatarsal shaft.  SURGEON:  Lind Guest. Ninfa Linden, M.D.  ANESTHESIA: 1. Right ankle block. 2. Mask ventilation IV sedation.  BLOOD LOSS:  Minimal.  COMPLICATIONS:  None.  INDICATIONS:  Ms. Silvernail is a 81 year old female with severe bunion deformity involving her right foot.  She has had a chronic wound that apparently she has been dealing with her out 2 years now and going to the wound center.  She was admitted to the hospital for IV antibiotics after cellulitis was developing and she had a chronic malodorous wound over the bunion area with an open exposed bone and obvious osteomyelitis.  She has been on IV antibiotics.  I have talked to her and her daughter who is her power of attorney and recommended first ray resection.  They understand the need to proceed with surgery and they are allowing [place] to proceed.  PROCEDURE DESCRIPTION:  After informed consent obtained, appropriate right foot was marked.  She was brought to the operating room after adequate ankle block was obtained in the holding area.  Her right foot was then prepped and draped with DuraPrep and sterile drapes.  Time-out was called and she was identified as the correct right foot  and the correct patient.  After confirming that the ankle block was doing excellent, I used a #10 blade and sharply debrided necrotic skin and fascia.  I used an oscillating saw to remove necrotic bone.  We then irrigated the wound and then performed a first ray amputation after sharp excisional debridement of necrotic skin, fascia, soft tissue, and bone.  We irrigated again thoroughly and after using the oscillating saw to cut through the metatarsal, we were able to reapproximate the skin and soft tissue to close the wound completely.  This was completed with interrupted 2-0 nylon suture.  Xeroform and well-padded sterile dressing was applied.  She was taken to the recovery room in stable condition.  All final counts were correct.  There were no complications noted.  Postoperatively, we will have her up with therapy with putting only weight to her heel and a special Darco shoe.  We will continue to see her in followup.     Lind Guest. Ninfa Linden, M.D.     CYB/MEDQ  D:  08/10/2014  T:  08/10/2014  Job:  [id number]

## 2014-08-10 NOTE — Brief Op Note (Signed)
08/08/2014 - 08/10/2014  8:17 AM  PATIENT:  Michel Harrow Roussin  81 y.o. female  PRE-OPERATIVE DIAGNOSIS:  infection, necrosis right foot  POST-OPERATIVE DIAGNOSIS:  infection, necrosis right foot  PROCEDURE:  Procedure(s) with comments: AMPUTATION RAY (Right) - ankle block  SURGEON:  Surgeon(s) and Role:    * Mcarthur Rossetti, MD - Primary  ANESTHESIA:   regional and IV sedation  EBL:  Total I/O In: 300 [I.V.:300] Out: -   BLOOD ADMINISTERED:none  DRAINS: none   LOCAL MEDICATIONS USED:  NONE  SPECIMEN:  No Specimen  DISPOSITION OF SPECIMEN:  N/A  COUNTS:  YES  TOURNIQUET:    DICTATION: .Other Dictation: Dictation Number [id number]  PLAN OF CARE: Admit to inpatient   PATIENT DISPOSITION:  PACU - hemodynamically stable.   Delay start of Pharmacological VTE agent (>24hrs) due to surgical blood loss or risk of bleeding: no

## 2014-08-11 ENCOUNTER — Encounter (HOSPITAL_COMMUNITY): Payer: Self-pay | Admitting: Internal Medicine

## 2014-08-11 DIAGNOSIS — R4702 Dysphasia: Secondary | ICD-10-CM | POA: Diagnosis present

## 2014-08-11 DIAGNOSIS — E1169 Type 2 diabetes mellitus with other specified complication: Secondary | ICD-10-CM | POA: Diagnosis present

## 2014-08-11 DIAGNOSIS — M869 Osteomyelitis, unspecified: Secondary | ICD-10-CM

## 2014-08-11 DIAGNOSIS — L03119 Cellulitis of unspecified part of limb: Secondary | ICD-10-CM

## 2014-08-11 LAB — BASIC METABOLIC PANEL
Anion gap: 7 (ref 5–15)
BUN: 26 mg/dL — AB (ref 6–20)
CHLORIDE: 101 mmol/L (ref 101–111)
CO2: 27 mmol/L (ref 22–32)
Calcium: 7.7 mg/dL — ABNORMAL LOW (ref 8.9–10.3)
Creatinine, Ser: 1.52 mg/dL — ABNORMAL HIGH (ref 0.44–1.00)
GFR, EST AFRICAN AMERICAN: 33 mL/min — AB (ref >60)
GFR, EST NON AFRICAN AMERICAN: 28 mL/min — AB (ref >60)
Glucose, Bld: 205 mg/dL — ABNORMAL HIGH (ref 65–99)
Potassium: 5.1 mmol/L (ref 3.5–5.1)
SODIUM: 135 mmol/L (ref 135–145)

## 2014-08-11 LAB — CBC
HCT: 28.1 % — ABNORMAL LOW (ref 36.0–46.0)
HEMOGLOBIN: 8.8 g/dL — AB (ref 12.0–15.0)
MCH: 26.6 pg (ref 26.0–34.0)
MCHC: 31.3 g/dL (ref 30.0–36.0)
MCV: 84.9 fL (ref 78.0–100.0)
Platelets: 438 10*3/uL — ABNORMAL HIGH (ref 150–400)
RBC: 3.31 MIL/uL — ABNORMAL LOW (ref 3.87–5.11)
RDW: 14.4 % (ref 11.5–15.5)
WBC: 10.7 10*3/uL — ABNORMAL HIGH (ref 4.0–10.5)

## 2014-08-11 LAB — GLUCOSE, CAPILLARY
Glucose-Capillary: 126 mg/dL — ABNORMAL HIGH (ref 65–99)
Glucose-Capillary: 171 mg/dL — ABNORMAL HIGH (ref 65–99)
Glucose-Capillary: 193 mg/dL — ABNORMAL HIGH (ref 65–99)

## 2014-08-11 LAB — VANCOMYCIN, TROUGH: Vancomycin Tr: 14 ug/mL (ref 10.0–20.0)

## 2014-08-11 MED ORDER — ALBUTEROL SULFATE (2.5 MG/3ML) 0.083% IN NEBU
5.0000 mg | INHALATION_SOLUTION | Freq: Once | RESPIRATORY_TRACT | Status: AC
  Administered 2014-08-11: 5 mg via RESPIRATORY_TRACT
  Filled 2014-08-11: qty 6

## 2014-08-11 MED ORDER — FUROSEMIDE 40 MG PO TABS: 40.0000 mg | ORAL_TABLET | Freq: Every day | ORAL | Status: DC

## 2014-08-11 MED ORDER — FUROSEMIDE 20 MG PO TABS
20.0000 mg | ORAL_TABLET | Freq: Two times a day (BID) | ORAL | Status: DC
  Administered 2014-08-11: 20 mg via ORAL
  Filled 2014-08-11: qty 1

## 2014-08-11 MED ORDER — FUROSEMIDE 20 MG PO TABS
20.0000 mg | ORAL_TABLET | ORAL | Status: AC
Start: 2014-08-11 — End: 2014-08-11
  Administered 2014-08-11: 20 mg via ORAL
  Filled 2014-08-11: qty 1

## 2014-08-11 MED ORDER — INSULIN GLARGINE 100 UNIT/ML ~~LOC~~ SOLN: 40.0000 [IU] | Freq: Every morning | SUBCUTANEOUS | Status: DC

## 2014-08-11 MED ORDER — INSULIN ASPART 100 UNIT/ML ~~LOC~~ SOLN
0.0000 [IU] | Freq: Every day | SUBCUTANEOUS | Status: DC
  Administered 2014-08-13: 3 [IU] via SUBCUTANEOUS

## 2014-08-11 MED ORDER — INSULIN ASPART 100 UNIT/ML ~~LOC~~ SOLN
0.0000 [IU] | Freq: Three times a day (TID) | SUBCUTANEOUS | Status: DC
  Administered 2014-08-11 – 2014-08-12 (×4): 3 [IU] via SUBCUTANEOUS
  Administered 2014-08-12: 5 [IU] via SUBCUTANEOUS
  Administered 2014-08-13 (×2): 3 [IU] via SUBCUTANEOUS

## 2014-08-11 MED ORDER — JUVEN PO PACK: 1.0000 | PACK | Freq: Two times a day (BID) | ORAL | Status: DC

## 2014-08-11 MED ORDER — GLUCERNA SHAKE PO LIQD: 237.0000 mL | Freq: Three times a day (TID) | ORAL | Status: DC

## 2014-08-11 MED ORDER — FUROSEMIDE 20 MG PO TABS: 20.0000 mg | ORAL_TABLET | Freq: Two times a day (BID) | ORAL | Status: DC

## 2014-08-11 MED ORDER — INSULIN GLARGINE 100 UNIT/ML ~~LOC~~ SOLN
40.0000 [IU] | Freq: Every morning | SUBCUTANEOUS | Status: DC
  Administered 2014-08-11 – 2014-08-13 (×3): 40 [IU] via SUBCUTANEOUS
  Filled 2014-08-11 (×3): qty 0.4

## 2014-08-11 NOTE — Evaluation (Signed)
Physical Therapy Evaluation Patient Details Name: [name] MRN: [id number] DOB: [birth year] Today's Date: 08/11/2014   History of Present Illness  81 yo female s/p I&D, 1st ray amputation R foot 08/10/14. hx of CVA affecting R side, MI, HTN, CHF, A flutter, DM, CAD.   Clinical Impression  On eval, pt required Mod assist for mobility-able to perform stand pivot from bed to recliner with RW. Pt denied pain and tolerated activity fairly well. Audible wheezing and dyspnea noted during session. Remained on [place] O2-sats 93%. At this time, recommend ST rehab at [facility]. Will continue to follow and assess pt's ability to perform bed mobility and transfers with no more than supervision level assist. Pt lives alone and at baseline was able to perform transfers with Mod Independence.     Follow Up Recommendations SNF (depending on progress)    Equipment Recommendations  None recommended by PT    Recommendations for Other Services OT consult     Precautions / Restrictions Precautions Precautions: Fall Required Braces or Orthoses: Other Brace/Splint Other Brace/Splint: DARCO shoe R foot Restrictions Weight Bearing Restrictions: Yes RLE Weight Bearing: Weight bearing as tolerated Other Position/Activity Restrictions: through heel      Mobility  Bed Mobility Overal bed mobility: Needs Assistance Bed Mobility: Supine to Sit     Supine to sit: HOB elevated;Mod assist     General bed mobility comments: Assist for trunk and LEs off bed. Increased time. Utilized bedpad for scooting, positioning  Transfers Overall transfer level: Needs assistance Equipment used: Rolling walker (2 wheeled) Transfers: Sit to/from [facility] Sit to Stand: Mod assist Stand pivot transfers: Mod assist       General transfer comment: Assist to rise, stabilize, control descent. Stand pivot with RW from bed to recliner.   Ambulation/Gait             General Gait Details: NT-pt  nonambulatory  Stairs            Wheelchair Mobility    Modified Rankin (Stroke Patients Only)       Balance Overall balance assessment: Needs assistance         Standing balance support: Bilateral upper extremity supported;During functional activity Standing balance-Leahy Scale: Poor                               Pertinent Vitals/Pain Pain Assessment: No/denies pain    Home Living Family/patient expects to be discharged to:: Private residence Living Arrangements: Alone Available Help at Discharge: Family;Available PRN/intermittently;Personal care attendant (alone in evening/overnight) Type of Home: House         Home Equipment: Wheelchair - [employer] - 4 wheels      Prior Function Level of Independence: Needs assistance      ADL's / Homemaking Assistance Needed: aide 4-5 hours/day  Comments: Mod Ind with transfers to/from wheelchair     Hand Dominance        Extremity/Trunk Assessment   Upper Extremity Assessment: Generalized weakness           Lower Extremity Assessment: Generalized weakness;RLE deficits/detail;LLE deficits/detail RLE Deficits / Details: post-op dressing R foot    Cervical / Trunk Assessment: Kyphotic  Communication   Communication: No difficulties  Cognition Arousal/Alertness: Awake/alert Behavior During Therapy: WFL for tasks assessed/performed Overall Cognitive Status: Within Functional Limits for tasks assessed  General Comments      Exercises        Assessment/Plan    PT Assessment Patient needs continued PT services  PT Diagnosis Difficulty walking;Generalized weakness   PT Problem List Decreased strength;Decreased activity tolerance;Decreased balance;Decreased mobility;Decreased knowledge of precautions;Decreased knowledge of use of DME  PT Treatment Interventions DME instruction;Functional mobility training;Therapeutic activities;Therapeutic  exercise;Wheelchair mobility training;Balance training   PT Goals (Current goals can be found in the Care Plan section) Acute Rehab PT Goals Patient Stated Goal: none stated PT Goal Formulation: With patient Time For Goal Achievement: 08/25/14 Potential to Achieve Goals: Fair    Frequency Min 3X/week   Barriers to discharge        Co-evaluation               End of Session Equipment Utilized During Treatment: Gait belt Activity Tolerance: Patient tolerated treatment well Patient left: in chair;with call bell/phone within reach;with chair alarm set           Time: 1128-1201 PT Time Calculation (min) (ACUTE ONLY): 33 min   Charges:   PT Evaluation $Initial PT Evaluation Tier I: 1 Procedure PT Treatments $Therapeutic Activity: 8-22 mins   PT G Codes:        Weston Anna, MPT Pager: [phone]

## 2014-08-11 NOTE — Progress Notes (Addendum)
Progress Note   [name] PRF:[id number] DOB: 1919/12/16 DOA: 08/08/2014 PCP: Jani Gravel, MD   Brief Narrative:   [name] is an 81 y.o. female with PMH of right fourth toe amputation, type 2 diabetes complicated by neuropathy and stage III-IV chronic kidney disease, hypertension, hypothyroidism who was admitted 08/08/14 with an infected right great toe wound.  Assessment/Plan:   Principal problem  Right great toe diabetic ulcer / right lower extremity cellulitis - Continue vancomycin and zosyn given ongoing RLE swelling. Likely can discharge off antibiotics. - Being followed by orthopedic surgery, s/p right foot first ray amputation 08/10/14. - Mobilize with PT. Social worker consulted for SNF placement.  Active problems Questionable dysphasia - Nurse reports difficulty swallowing. Speech therapy evaluation requested.  Hyperkalemia - Resolved with Kayexalate 15 g given 08/10/14.  Diabetes mellitus with diabetic neuropathy both feet, CKD stage III - IV - Hemoglobin 10.1. - Currently being managed with resistant scale SSI Q AC/HS and 6 units meal coverage, and 30 units of Lantus daily.  - CBGs elevated at 165-350. We'll increase Lantus to 40 units daily and decrease correction to moderate scale. - Diabetes coordinator following. Recommendations noted. - Continue Neurontin for neuropathy.   Anemia of chronic disease, CKD, DM - Hemoglobin stable.  Acute on CKD stage III - IV - Baseline creatinine appears to be around 1.5. Current creatinine back to baseline values. - Resume Lasix, breathing seems a little bit more labored.  Chronic diastolic CHF, grade II per 2 D ECHO 01/2012 - Monitor for signs of decompensated heart failure. Continue strict I/O and daily weights.  HTN - Continue home medical regimen with Imdur, Coreg, Cardizem.  Hypothyroidism - Continue synthroid.   DVT prophylaxis  - Lovenox SQ.  Family Communication: No family at the bedside.   [name] is her daughter. Told patient to have the nurse page me if she would like to speak with me when she arrives.  Left message for her on [phone]. Disposition Plan: SNF when bed available, likely tomorrow. Code Status:     Code Status Orders        Start     Ordered   08/08/14 1709  Full code   Continuous     08/08/14 1720    Advance Directive Documentation        Most Recent Value   Type of Advance Directive  Living will   Pre-existing out of facility DNR order (yellow form or pink MOST form)     "MOST" Form in Place?          IV Access:    Peripheral IV   Procedures and diagnostic studies:   08/10/14: Right first ray amputation  Medical Consultants:    Orthopedic Surgery  Anti-Infectives:    Vancomycin 08/08/14--->  Zosyn 08/08/14--->  Subjective:   [name] continues to deny nausea, vomiting, diarrhea, foot pain.  Appetite good.  Denies dyspnea although using abdominal muscles to breathe.  Objective:    Filed Vitals:   08/10/14 1715 08/10/14 2041 08/10/14 2223 08/11/14 0444  BP: 157/55 138/42 139/41 132/50  Pulse:  84  81  Temp:  99.1 F (37.3 C)  98.7 F (37.1 C)  TempSrc:  Axillary  Oral  Resp:  20  22  Height:      Weight:    75.7 kg (166 lb 14.2 oz)  SpO2:  100%  100%    Intake/Output Summary (Last 24 hours) at 08/11/14 0744 Last data  filed at 08/11/14 0640  Gross per 24 hour  Intake 1977.5 ml  Output      0 ml  Net 1977.5 ml    Exam: Gen:  NAD Cardiovascular:  RRR, No M/R/G Respiratory:  Lungs with a few crackles Gastrointestinal:  Abdomen soft, NT/ND, + BS Extremities:  RLE swollen compared to left, right foot bandaged   Data Reviewed:    Labs: Basic Metabolic Panel:  Recent Labs Lab 08/08/14 1411 08/09/14 0513 08/10/14 0413 08/11/14 0519  NA 130* 131* 131* 135  K 5.2* 5.0 5.4* 5.1  CL 95* 98* 98* 101  CO2 29 28 26 27   GLUCOSE 193* 244* 356* 205*  BUN 30* 29* 29* 26*  CREATININE 1.83*  1.82* 1.73* 1.52*  CALCIUM 8.4* 7.9* 7.8* 7.7*   GFR Estimated Creatinine Clearance: 23.3 mL/min (by C-G formula based on Cr of 1.52). Liver Function Tests:  Recent Labs Lab 08/08/14 1411  AST 10*  ALT 8*  ALKPHOS 73  BILITOT 0.1*  PROT 7.4  ALBUMIN 2.8*   CBC:  Recent Labs Lab 08/08/14 1411 08/09/14 0513 08/11/14 0519  WBC 11.5* 9.6 10.7*  NEUTROABS 8.3*  --   --   HGB 10.3* 9.3* 8.8*  HCT 31.8* 29.1* 28.1*  MCV 81.3 82.7 84.9  PLT 386 401* 438*   BNP (last 3 results)  Recent Labs  08/27/13 2127  PROBNP 231.0   CBG:  Recent Labs Lab 08/09/14 2130 08/10/14 0605 08/10/14 1144 08/10/14 1649 08/10/14 2208  GLUCAP 319* 350* 223* 208* 165*    Microbiology Recent Results (from the past 240 hour(s))  Culture, blood (routine x 2)     Status: None (Preliminary result)   Collection Time: 08/08/14  6:35 PM  Result Value Ref Range Status   Specimen Description BLOOD BLOOD RIGHT ARM  Final   Special Requests BOTTLES DRAWN AEROBIC AND ANAEROBIC 10CC  Final   Culture   Final    NO GROWTH 2 DAYS Performed at [facility]    Report Status PENDING  Incomplete  Culture, blood (routine x 2)     Status: None (Preliminary result)   Collection Time: 08/08/14  6:45 PM  Result Value Ref Range Status   Specimen Description BLOOD BLOOD LEFT HAND  Final   Special Requests BOTTLES DRAWN AEROBIC ONLY [place]  Final   Culture   Final    NO GROWTH 2 DAYS Performed at [facility]    Report Status PENDING  Incomplete     Medications:   . aspirin EC  81 mg Oral Daily  . carvedilol  6.25 mg Oral BID WC  . diltiazem  180 mg Oral QHS  . feeding supplement (GLUCERNA SHAKE)  237 mL Oral TID BM  . gabapentin  300 mg Oral BID  . insulin aspart  0-20 Units Subcutaneous TID WC  . insulin aspart  0-5 Units Subcutaneous QHS  . insulin aspart  6 Units Subcutaneous TID WC  . insulin glargine  30 Units Subcutaneous q morning - 10a  . isosorbide mononitrate  30 mg Oral  QHS  . levothyroxine  175 mcg Oral QAC breakfast  . nutrition supplement (JUVEN)  1 packet Oral BID WC  . piperacillin-tazobactam (ZOSYN)  IV  3.375 g Intravenous Q8H  . tamsulosin  0.4 mg Oral q morning - 10a  . vancomycin  750 mg Intravenous Q24H   Continuous Infusions: . sodium chloride 50 mL/hr at 08/11/14 0439    Time spent: 25 minutes.   LOS:  3 days   [facility] Pager [phone]. If unable to reach me by pager, please call my cell phone at [phone].  *Please refer to [email or web address], password [email or web address] to get updated schedule on who will round on this patient, as hospitalists switch teams weekly. If 7PM-7AM, please contact night-coverage at [email or web address], password [email or web address] for any overnight needs.  08/11/2014, 7:44 AM

## 2014-08-11 NOTE — Discharge Summary (Addendum)
Physician Discharge Summary  [name] [id number]:[id number] DOB: 12/22/1919 DOA: 08/08/2014  PCP: Jani Gravel, MD  Admit date: 08/08/2014 Discharge date: 08/11/2014   Recommendations for Outpatient Follow-Up:   1. The patient will follow up with Dr. Meredith Pel, who performed her first ray amputation. 2. PCP: Please follow-up on final blood culture results.   Discharge Diagnosis:   Principal Problem:    Open wound of right great toe and osteomyelitis status post first ray amputation Active Problems:    HYPERTENSION, BENIGN ESSENTIAL    Hypothyroidism    Type 2 diabetes mellitus with diabetic foot infection    Acute on chronic renal failure    Hyperkalemia    Cellulitis and abscess of toe    Toe infection    Diabetic neuropathy    Dysphasia    Cellulitis of foot    Hyponatremia    Chronic diastolic heart failure    Diabetic osteomyelitis/acute osteomyelitis of toe    Stage III chronic kidney disease    Multi-factorial gait disorder   Discharge disposition:  SNF  Discharge Condition: Improved.  Diet recommendation: Low sodium, heart healthy.  Carbohydrate-modified.    Wound care: Change dressings right foot as needed.   History of Present Illness:   [name] is an 81 y.o. female with PMH of right fourth toe amputation, type 2 diabetes complicated by neuropathy and stage III-IV chronic kidney disease, hypertension, hypothyroidism who was admitted 08/08/14 with an infected right great toe wound and presumed osteomyelitis given elevated ESR/CRP.   Hospital Course by Problem:   Right great toe diabetic ulcer / right lower extremity cellulitis - Continue vancomycin and zosyn given ongoing RLE swelling. - Being followed by orthopedic surgery, s/p right foot first ray amputation 08/10/14. - Mobilize with PT. Social worker consulted for SNF placement.  ? Healthcare associated pneumonia Chest x-ray shows bilateral pulmonary infiltrate,   possible pneumonia Patient has been afebrile, normal white count. Check CBG in a.m. She has been on vancomycin and Zosyn for five days Clinical picture more consistent with pulmonary edema than pneumonia, she is afebrile, no dyspnea, O2 sats are 95% on RA Will discharge on 5 days of Levaquin 500 mg q 48 hrs  Questionable dysphasia - Nurse reports difficulty swallowing. Speech therapy evaluation done, patient only has mild risk of aspiration started on age-appropriate solids with thin liquids  Hyperkalemia - Resolved with Kayexalate 15 g given 08/10/14.  Diabetes mellitus with diabetic neuropathy both feet, CKD stage III - IV - Hemoglobin a1c 10.1. - Currently being managed with resistant scale SSI Q AC/HS and 6 units meal coverage, and 30 units of Lantus daily.  - CBGs elevated at 165-350. We'll increase Lantus to 40 units daily and decrease correction to moderate scale. - Continue Neurontin for neuropathy.   Anemia of chronic disease, CKD, DM - Hemoglobin stable.  Acute on CKD stage III - IV - Baseline creatinine appears to be around 1.5. Current creatinine back to baseline values. - Lasix 20 mg IV every 12 hours   Acute on Chronic diastolic CHF, grade II per 2 D ECHO 01/2012 -Patient was short of breath last night, chest x-ray shows pulmonary edema - Start Lasix 20 mg IV every 12 hours - Restart lasix 20 mg po bid  HTN - Continue home medical regimen with Imdur, Coreg, Cardizem.  Hypothyroidism - Continue synthroid.     Medical Consultants:    None.   Discharge Exam:   Filed Vitals:   08/11/14 1300  BP: 160/57  Pulse: 74  Temp: 97.9 F (36.6 C)  Resp: 20   Filed Vitals:   08/11/14 0444 08/11/14 0853 08/11/14 0854 08/11/14 1300  BP: 132/50 131/40  160/57  Pulse: 81 77  74  Temp: 98.7 F (37.1 C)   97.9 F (36.6 C)  TempSrc: Oral   Oral  Resp: 22   20  Height:      Weight: 75.7 kg (166 lb 14.2 oz)     SpO2: 100% 88% 93% 100%    Gen:   NAD Cardiovascular:  RRR, No M/R/G Respiratory: Lungs CTAB Gastrointestinal: Abdomen soft, NT/ND with normal active bowel sounds. Extremities: No C/E/C   The results of significant diagnostics from this hospitalization (including imaging, microbiology, ancillary and laboratory) are listed below for reference.     Procedures and Diagnostic Studies:   Dg Chest 2 View  08/08/2014   CLINICAL DATA:  Chronic wound involving the right great toe.  EXAM: CHEST  2 VIEW  COMPARISON:  08/27/2013  FINDINGS: The heart is borderline enlarged but stable. The mediastinal and hilar contours are within normal limits and unchanged. There is mild tortuosity and calcification of the thoracic aorta. There are chronic bronchitic type interstitial lung changes but no definite acute overlying pulmonary process. No pleural effusion. Remote healed rib fractures are noted along with remote thoracic compression fractures.  IMPRESSION: Chronic lung changes but no acute pulmonary findings.   Electronically Signed   By: Marijo Sanes M.D.   On: 08/08/2014 14:39   Dg Foot Complete Right  08/08/2014   CLINICAL DATA:  81 year old female with with redness of the right foot and ulcer on the great toe.  EXAM: RIGHT FOOT COMPLETE - 3+ VIEW  COMPARISON:  Radiograph dated 07/04/2014  FINDINGS: There is stable appearing amputation of the fourth toe. There is an old fracture deformity of the third metatarsal. No acute fracture or dislocation identified. There is mild hallux valgus. There is diffuse osteopenia.  There is diffuse soft tissue swelling and edema. A skin ulcer is noted along the medial aspect of the great toe. An ulcer is also noted adjacent to the first metatarsophalangeal joint. No definite periosteal reaction or radiographic signs of osteomyelitis identified. MRI or white blood cell nuclear scan may provide better evaluation if there is high clinical suspicion for acute osteomyelitis.  IMPRESSION: Diffuse soft tissue swelling  with skin ulcers along the medial aspect of the great toe. No definite radiographic signs of osteomyelitis. MRI may provide better evaluation if clinically indicated   Electronically Signed   By: Anner Crete M.D.   On: 08/08/2014 19:25   08/10/14: Right first ray amputation  Labs:   Basic Metabolic Panel:  Recent Labs Lab 08/08/14 1411 08/09/14 0513 08/10/14 0413 08/11/14 0519  NA 130* 131* 131* 135  K 5.2* 5.0 5.4* 5.1  CL 95* 98* 98* 101  CO2 _0 GLUCOSE 193* 244* 356* 205*  BUN 30* 29* 29* 26*  CREATININE 1.83* 1.82* 1.73* 1.52*  CALCIUM 8.4* 7.9* 7.8* 7.7*   GFR Estimated Creatinine Clearance: 23.3 mL/min (by C-G formula based on Cr of 1.52). Liver Function Tests:  Recent Labs Lab 08/08/14 1411  AST 10*  ALT 8*  ALKPHOS 73  BILITOT 0.1*  PROT 7.4  ALBUMIN 2.8*   CBC:  Recent Labs Lab 08/08/14 1411 08/09/14 0513 08/11/14 0519  WBC 11.5* 9.6 10.7*  NEUTROABS 8.3*  --   --   HGB 10.3* 9.3* 8.8*  HCT 31.8*  29.1* 28.1*  MCV 81.3 82.7 84.9  PLT 386 401* 438*   CBG:  Recent Labs Lab 08/09/14 2130 08/10/14 0605 08/10/14 1144 08/10/14 1649 08/10/14 2208  GLUCAP 319* 350* 223* 208* 165*   Hgb A1c  Recent Labs  08/09/14 0513  HGBA1C 10.1*   Microbiology Recent Results (from the past 240 hour(s))  Culture, blood (routine x 2)     Status: None (Preliminary result)   Collection Time: 08/08/14  6:35 PM  Result Value Ref Range Status   Specimen Description BLOOD BLOOD RIGHT ARM  Final   Special Requests BOTTLES DRAWN AEROBIC AND ANAEROBIC 10CC  Final   Culture   Final    NO GROWTH 3 DAYS Performed at [facility]    Report Status PENDING  Incomplete  Culture, blood (routine x 2)     Status: None (Preliminary result)   Collection Time: 08/08/14  6:45 PM  Result Value Ref Range Status   Specimen Description BLOOD BLOOD LEFT HAND  Final   Special Requests BOTTLES DRAWN AEROBIC ONLY [place]  Final   Culture   Final    NO GROWTH 3  DAYS Performed at [facility]    Report Status PENDING  Incomplete     Discharge Instructions:       Discharge Instructions    Change dressing (specify)    Complete by:  As directed   Dressing change: As needed to right foot surgical site.     Diet - low sodium heart healthy    Complete by:  As directed      Diet Carb Modified    Complete by:  As directed      Increase activity slowly    Complete by:  As directed      Walk with assistance    Complete by:  As directed      Walker     Complete by:  As directed             Medication List    TAKE these medications        aspirin 81 MG tablet  Take 81 mg by mouth daily.     carvedilol 6.25 MG tablet  Commonly known as:  COREG  Take 6.25 mg by mouth 2 (two) times daily with a meal.     diltiazem 180 MG 24 hr capsule  Commonly known as:  CARDIZEM CD  Take 180 mg by mouth at bedtime.     feeding supplement (GLUCERNA SHAKE) Liqd  Take 237 mLs by mouth 3 (three) times daily between meals.     nutrition supplement (JUVEN) Pack  Take 1 packet by mouth 2 (two) times daily with a meal.     furosemide 20 MG tablet  Commonly known as:  LASIX  Take 20 mg by mouth 2 (two) times daily. Take one tablet in the morning and one tablet in the evening.     gabapentin 300 MG capsule  Commonly known as:  NEURONTIN  Take 300 mg by mouth 2 (two) times daily.     insulin aspart 100 UNIT/ML injection  Commonly known as:  novoLOG  Inject 4-8 Units into the skin 3 (three) times daily before meals.     insulin glargine 100 UNIT/ML injection  Commonly known as:  LANTUS  Inject 0.4 mLs (40 Units total) into the skin every morning.     isosorbide mononitrate 30 MG 24 hr tablet  Commonly known as:  IMDUR  Take 30 mg by  mouth at bedtime.     levothyroxine 175 MCG tablet  Commonly known as:  SYNTHROID, LEVOTHROID  Take 175 mcg by mouth daily before breakfast.     multivitamin tablet  Take 1 tablet by mouth daily.      tamsulosin 0.4 MG Caps capsule  Commonly known as:  FLOMAX  Take 0.4 mg by mouth every morning.     VITAMIN E PO  Take 1 capsule by mouth daily.       Follow-up Information    Follow up with Meredith Pel, MD. Schedule an appointment as soon as possible for a visit in 1 week.   Specialty:  Orthopedic Surgery   Why:  Surgical follow-up   Contact information:   [place] [address] [phone]       Follow up with Jani Gravel, MD In 1 week.   Specialty:  Internal Medicine   Why:  Hospital follow-up, diabetes control   Contact information:   [address] [place] [address] [phone]        Time coordinating discharge: 35 minutes.  Signed:  RAMA,CHRISTINA  Pager [phone] [facility] 08/11/2014, 3:23 PM

## 2014-08-11 NOTE — Progress Notes (Signed)
ANTIBIOTIC CONSULT NOTE   Pharmacy Consult for Vancomycin & Zosyn Indication: LE cellulitis  Allergies  Allergen Reactions  . Ephedrine     Unknown.    . Morphine Other (See Comments)    Difficulty waking up & talking after surgery  . Rocephin [Ceftriaxone Sodium In Dextrose]     Developed acute resp distress, wheezing after 2nd dose.   Patient Measurements: Height: 5\' 8"  (172.7 cm) Weight: 166 lb 14.2 oz (75.7 kg) IBW/kg (Calculated) : 63.9  Vital Signs: Temp: 97.9 F (36.6 C) (07/17 1300) Temp Source: Oral (07/17 1300) BP: 160/57 mmHg (07/17 1300) Pulse Rate: 74 (07/17 1300) Intake/Output from previous day: 07/16 0701 - 07/17 0700 In: 1977.5 [P.O.:240; I.V.:1437.5; IV Piggyback:300] Out: -  Intake/Output from this shift: Total I/O In: 600 [P.O.:600] Out: -   Labs:  Recent Labs  08/09/14 0513 08/10/14 0413 08/11/14 0519  WBC 9.6  --  10.7*  HGB 9.3*  --  8.8*  PLT 401*  --  438*  CREATININE 1.82* 1.73* 1.52*   Estimated Creatinine Clearance: 23.3 mL/min (by C-G formula based on Cr of 1.52).  Recent Labs  08/11/14 1507  [place] 14     Microbiology: Recent Results (from the past 720 hour(s))  Culture, blood (routine x 2)     Status: None (Preliminary result)   Collection Time: 08/08/14  6:35 PM  Result Value Ref Range Status   Specimen Description BLOOD BLOOD RIGHT ARM  Final   Special Requests BOTTLES DRAWN AEROBIC AND ANAEROBIC 10CC  Final   Culture   Final    NO GROWTH 3 DAYS Performed at [facility]    Report Status PENDING  Incomplete  Culture, blood (routine x 2)     Status: None (Preliminary result)   Collection Time: 08/08/14  6:45 PM  Result Value Ref Range Status   Specimen Description BLOOD BLOOD LEFT HAND  Final   Special Requests BOTTLES DRAWN AEROBIC ONLY [place]  Final   Culture   Final    NO GROWTH 3 DAYS Performed at [facility]    Report Status PENDING  Incomplete    Medical History: Past Medical History   Diagnosis Date  . Myocardial infarction   . Coronary artery disease   . Hypertension   . Dysrhythmia   . Peripheral vascular disease   . Hypothyroidism   . Diabetes mellitus   . Presbyesophagus 04/23/2011  . Urinary retention with incomplete bladder emptying 04/23/2011  . UTI (lower urinary tract infection) 04/21/2011  . Diastolic CHF, chronic     Grade I diastolic dysfunction on Echo 04/26/11  . Anaphylaxis 04/25/2011    Thought to be from Rocephin  . History of atrial flutter     ablation  . C. difficile colitis 04/17/2012  . Atrial flutter 11/01/2012   Medications:  Scheduled:  . aspirin EC  81 mg Oral Daily  . carvedilol  6.25 mg Oral BID WC  . diltiazem  180 mg Oral QHS  . feeding supplement (GLUCERNA SHAKE)  237 mL Oral TID BM  . furosemide  20 mg Oral NOW  . [START ON 08/12/2014] furosemide  40 mg Oral Daily  . gabapentin  300 mg Oral BID  . insulin aspart  0-15 Units Subcutaneous TID WC  . insulin aspart  0-5 Units Subcutaneous QHS  . insulin aspart  6 Units Subcutaneous TID WC  . insulin glargine  40 Units Subcutaneous q morning - 10a  . isosorbide mononitrate  30 mg Oral  QHS  . levothyroxine  175 mcg Oral QAC breakfast  . nutrition supplement (JUVEN)  1 packet Oral BID WC  . piperacillin-tazobactam (ZOSYN)  IV  3.375 g Intravenous Q8H  . tamsulosin  0.4 mg Oral q morning - 10a  . vancomycin  750 mg Intravenous Q24H   Anti-infectives    Start     Dose/Rate Route Frequency Ordered Stop   08/09/14 1600  vancomycin (VANCOCIN) IVPB 750 mg/150 ml premix     750 mg 150 mL/hr over 60 Minutes Intravenous Every 24 hours 08/08/14 1501     08/08/14 2200  piperacillin-tazobactam (ZOSYN) IVPB 3.375 g     3.375 g 12.5 mL/hr over 240 Minutes Intravenous Every 8 hours 08/08/14 1500     08/08/14 1500  vancomycin (VANCOCIN) 1,250 mg in sodium chloride 0.9 % 250 mL IVPB  Status:  Discontinued     1,250 mg 166.7 mL/hr over 90 Minutes Intravenous  Once 08/08/14 1355 08/08/14 1720    08/08/14 1400  piperacillin-tazobactam (ZOSYN) IVPB 3.375 g     3.375 g 100 mL/hr over 30 Minutes Intravenous  Once 08/08/14 1352 08/08/14 1550     Assessment: 89 yoF with ongoing treatment of R great toe wound at [place]. Now with two week hx of swelling, pain and recent drainage. Sent to ED from [place]. Status post toe amputation 7/17  7/14 >> Vanc >>  7/14 >> Zosyn >>   7/14 blood x 2: NGTD   Renal: SCr improving  Dose changes/levels:  7/17 1500 VT = 14 mcg/ml on 750mg  q24h (prior to 4th overall dose)  Goal of Therapy:  Vancomycin trough level 15-20 mcg/ml  Plan:  Day #3 vanco/zosyn  Continue vancomycin 750mg  IV q24h as trough is acceptable  Continue Zosyn 3.375gm  q8hr- 4 hr infusion  Follow renal funtction  Doreene Eland, PharmD, BCPS.   Pager: [phone] 08/11/2014, 4:27 PM

## 2014-08-11 NOTE — Progress Notes (Signed)
Pt stable Being washed currently Foot dressing dry Mobilize with PT

## 2014-08-11 NOTE — Clinical Social Work Note (Signed)
CSW received a call from RN stating that pt was ready for discharge  CSW was unable to get to this patient to assess or provide information to any SNF's and CSW is currently behind on other paperwork that needs to be completed for this day.  CSW will leave hand off for week day CSW to assess for possible SNF.  Marland KitchenDede Query, LCSW [facility] Clinical Social Worker - Weekend Coverage cell #: [phone]

## 2014-08-12 ENCOUNTER — Encounter (HOSPITAL_COMMUNITY): Payer: Self-pay | Admitting: Orthopaedic Surgery

## 2014-08-12 ENCOUNTER — Inpatient Hospital Stay (HOSPITAL_COMMUNITY): Payer: PPO

## 2014-08-12 DIAGNOSIS — E039 Hypothyroidism, unspecified: Secondary | ICD-10-CM

## 2014-08-12 DIAGNOSIS — R4702 Dysphasia: Secondary | ICD-10-CM

## 2014-08-12 DIAGNOSIS — S91101D Unspecified open wound of right great toe without damage to nail, subsequent encounter: Secondary | ICD-10-CM

## 2014-08-12 DIAGNOSIS — R0600 Dyspnea, unspecified: Secondary | ICD-10-CM

## 2014-08-12 DIAGNOSIS — I5032 Chronic diastolic (congestive) heart failure: Secondary | ICD-10-CM

## 2014-08-12 DIAGNOSIS — M86171 Other acute osteomyelitis, right ankle and foot: Secondary | ICD-10-CM

## 2014-08-12 LAB — GLUCOSE, CAPILLARY
GLUCOSE-CAPILLARY: 309 mg/dL — AB (ref 65–99)
Glucose-Capillary: 160 mg/dL — ABNORMAL HIGH (ref 65–99)
Glucose-Capillary: 99 mg/dL (ref 65–99)
Glucose-Capillary: 181 mg/dL — ABNORMAL HIGH (ref 65–99)
Glucose-Capillary: 242 mg/dL — ABNORMAL HIGH (ref 65–99)

## 2014-08-12 MED ORDER — FUROSEMIDE 10 MG/ML IJ SOLN
40.0000 mg | Freq: Once | INTRAMUSCULAR | Status: AC
  Administered 2014-08-12: 40 mg via INTRAVENOUS
  Filled 2014-08-12: qty 4

## 2014-08-12 MED ORDER — ALBUTEROL SULFATE (2.5 MG/3ML) 0.083% IN NEBU: 2.5000 mg | INHALATION_SOLUTION | RESPIRATORY_TRACT | Status: DC | PRN

## 2014-08-12 MED ORDER — FUROSEMIDE 10 MG/ML IJ SOLN
20.0000 mg | Freq: Two times a day (BID) | INTRAMUSCULAR | Status: DC
  Administered 2014-08-12 – 2014-08-13 (×2): 20 mg via INTRAVENOUS
  Filled 2014-08-12 (×2): qty 2

## 2014-08-12 NOTE — Clinical Social Work Note (Signed)
Clinical Social Work Assessment  Patient Details  Name: [name] MRN: [id number] Date of Birth: 12-Feb-1919  Date of referral:  08/12/14               Reason for consult:  Facility Placement                Permission sought to share information with:  Chartered certified accountant granted to share information::  Yes, Verbal Permission Granted  Name::        Agency::     Relationship::     Contact Information:     Housing/Transportation Living arrangements for the past 2 months:  Single Family Home Source of Information:  Patient, Adult Children Patient Interpreter Needed:  None Criminal Activity/Legal Involvement Pertinent to Current Situation/Hospitalization:  No - Comment as needed Significant Relationships:  Adult Children Lives with:  Self Do you feel safe going back to the place where you live?  No Need for family participation in patient care:  Yes (Comment)  Care giving concerns:  CSW received consult that patient will need SNF at discharge.    Social Worker assessment / plan:  CSW spoke with patient & daughter, [name] via phone to confirm that they would prefer [facility] SNF at discharge, as she has been there in the past (August 2015).   Employment status:  Retired Nurse, adult PT Recommendations:  [place] / Referral to community resources:  [place]  Patient/Family's Response to care:  CSW confirmed with Ivin Booty at [facility] that they would be able to take patient at discharge. Patient & daughter were very pleased to hear that.  Patient/Family's Understanding of and Emotional Response to Diagnosis, Current Treatment, and Prognosis:  Patient states that she is anxious to get out of the hospital, though per Dr. Darrick Meigs - patient's chest xray this morning showed that she has pneumonia and would therefore like to keep her 1 more day.   Emotional Assessment Appearance:  Appears stated  age Attitude/Demeanor/Rapport:    Affect (typically observed):  Quiet, Pleasant, Happy Orientation:  Oriented to Self, Oriented to Place, Oriented to  Time, Oriented to Situation Alcohol / Substance use:    Psych involvement (Current and /or in the community):     Discharge Needs  Concerns to be addressed:    Readmission within the last 30 days:    Current discharge risk:    Barriers to Discharge:      Standley Brooking, LCSW 08/12/2014, 10:35 AM

## 2014-08-12 NOTE — Discharge Instructions (Signed)
Bone and Joint Infections °Joint infections are called septic or infectious arthritis. An infected joint may damage cartilage and tissue very quickly. This may destroy the joint. Bone infections (osteomyelitis) may last for years. Joints may become stiff if left untreated. Bacteria are the most common cause. Other causes include viruses and fungi, but these are more rare. Bone and joint infections usually come from injury or infection elsewhere in your body; the germs are carried to your bones or joints through the bloodstream.  °CAUSES  °· Blood-carried germs from an infection elsewhere in your body can eventually spread to a bone or joint. The germ staphylococcus is the most common cause of both osteomyelitis and septic arthritis. °· An injury can introduce germs into your bones or joints. °SYMPTOMS  °· Weight loss. °· Tiredness. °· Chills and fever. °· Bone or joint pain at rest and with activity. °· Tenderness when touching the area or bending the joint. °· Refusal to bear weight on a leg or inability to use an arm due to pain. °· Decreased range of motion in a joint. °· Skin redness, warmth, and tenderness. °· Open skin sores and drainage. °RISK FACTORS °Children, the elderly, and those with weak immune systems are at increased risk of bone and joint infections. It is more common in people with HIV infections and with people on chemotherapy. People are also at increased risk if they have surgery where metal implants are used to stabilize the bone. Plates, screws, or artificial joints provide a surface that bacteria can stick on. Such a growth of bacteria is called biofilm. The biofilm protects bacteria from antibiotics and bodily defenses. This allows germs to multiply. Other reasons for increased risks include:  °· Having previous surgery or injury of a bone or joint. °· Being on high-dose corticosteroids and immunosuppressive medications that weaken your body's resistance to germs. °· Diabetes and  long-standing diseases. °· Use of intravenous street drugs. °· Being on hemodialysis. °· Having a history of urinary tract infections. °· Removal of your spleen (splenectomy). This weakens your immunity. °· Chronic viral infections such as HIV or AIDS. °· Lack of sensation such as paraplegia, quadriplegia, or spina bifida. °DIAGNOSIS  °· Increased numbers of white blood cells in your blood may indicate infection. Some times your caregivers are able to identify the infecting germs by testing your blood. Inflammatory markers present in your bloodstream such as an erythrocyte sedimentation rate (ESR or sed rate) or c-reactive protein (CRP) can be indicators of deep infection. °· Bone scans and X-ray exams are necessary for diagnosing osteomyelitis. They may help your caregiver find the infected areas. Other studies may give more detailed information. They may help detect fluid collections around a joint, abnormal bone surfaces, or be useful in diagnosing septic arthritis. They can find soft tissue swelling and find excess fluid in an infected joint or the adjacent bone. These tests include: °¨ Ultrasound. °¨ CT (computerized tomography). °¨ MRI (magnetic resonance imaging). °· The best test for diagnosing a bone or joint infection is an aspiration or biopsy. Your caregiver will usually use a local anesthetic. He or she can then remove tissue from a bone injury or use a needle to take fluids from an infected joint. A local anesthetic medication numbs the area to be biopsied. Often biopsies are done in the operating room under general anesthesia. This means you will be asleep during the procedure. Tests performed on these samples can identify an infection. °TREATMENT  °· Treatment can help control long-standing   infections, but infections may come back.  Infections can infect any bone or joint at any age.  Bone and joint infections are rarely fatal.  Bone infection left untreated can become a never-ending infection.  It can spread to other areas of your body. It may eventually cause bone death. Reduced limb or joint function can result. In severe cases, this may require removal of a limb. Spinal osteomyelitis is very dangerous. Untreated, it may damage spinal nerves and cause death.  The most common complication of septic arthritis is osteoarthritis with pain and decreased range of motion of the joint. Some forms of treatment may include:  If the infection is caused by bacteria, it is generally treated with antibiotics. You will likely receive the drugs through a vein (intravenously) for anywhere from 2 to 6 weeks. In some cases, especially with children, oral antibiotics following an initial intravenous dose may be effective. The treatment you receive depends on the:  Type of bacteria.  Location of the infection.  Type of surgery that might be done.  Other health conditions or issues you might have.  Your caregiver may drain soft tissue abscesses or pockets of fluid around infected bones or joints. If you have septic arthritis, your caregiver may use a needle to drain pus from the joint on a daily basis. He or she may use an arthroscope to clean the joint or may need to open the joint surgically to remove damaged tissue and infection. An arthroscope is an instrument like a thin lighted telescope. It can be used to look inside the joint.  Surgery is usually needed if the infection has become long-standing. It may also be needed if there is hardware (such as metal plates, screws, or artificial joints) inside the patient. Sometimes a bone or muscle graft is needed to fill in the open space. This promotes growth of new tissues and better blood flow to the area. PREVENTION   Clean and disinfect wounds quickly to help prevent the start of a bone or joint infection. Get treatment for any infections to prevent spread to a bone or joint.  Do not smoke. Smoking decreases healing rates of bone and predisposes to  infection.  When given medications that suppress your immune system, use them according to your caregiver's instructions. Do not take more than prescribed for your condition.  Take good care of your feet and skin, especially if you have diabetes, decreased sensation or circulation problems. SEEK IMMEDIATE MEDICAL CARE IF:   You cannot bear weight on a leg or use an arm, especially following a minor injury. This can be a sign of bone or joint infection.  You think you may have signs or symptoms of a bone or joint infection. Your chance of getting rid of an infection is better if treated early. Document Released: 01/11/2005 Document Revised: 04/05/2011 Document Reviewed: 12/11/2008 [facility] Patient Information 2015 [place], [place]. This information is not intended to replace advice given to you by your health care provider. Make sure you discuss any questions you have with your health care provider.   NEED TO KEEP RIGHT FOOT DRESSING ON, CLEAN AND DRY. YOU MAY PUT WEIGHT THRU YOUR RIGHT HEEL

## 2014-08-12 NOTE — Progress Notes (Signed)
Patient ID: [name], female   DOB: October 21, 1919, 81 y.o.   MRN: [id number] I changed her right foot dressing at the bedside and her wound looks great.  A new dressing was placed that I will keep on for the next 1-2 weeks.  Can be discharged from Ortho standpoint.

## 2014-08-12 NOTE — Clinical Social Work Placement (Signed)
   CLINICAL SOCIAL WORK PLACEMENT  NOTE  Date:  08/12/2014  Patient Details  Name: [name] MRN: [id number] Date of Birth: 1920/01/09  Clinical Social Work is seeking post-discharge placement for this patient at the [place] level of care (*CSW will initial, date and re-position this form in  chart as items are completed):  Yes   Patient/family provided with [place] Work Department's list of facilities offering this level of care within the geographic area requested by the patient (or if unable, by the patient's family).  Yes   Patient/family informed of their freedom to choose among providers that offer the needed level of care, that participate in Medicare, Medicaid or managed care program needed by the patient, have an available bed and are willing to accept the patient.  Yes   Patient/family informed of [facility]'s ownership interest in [facility] and [facility], as well as of the fact that they are under no obligation to receive care at these facilities.  PASRR submitted to EDS on 08/12/14     PASRR number received on 08/12/14     Existing PASRR number confirmed on       FL2 transmitted to all facilities in geographic area requested by pt/family on 08/12/14     FL2 transmitted to all facilities within larger geographic area on       Patient informed that his/her managed care company has contracts with or will negotiate with certain facilities, including the following:        Yes   Patient/family informed of bed offers received.  Patient chooses bed at [facility]     Physician recommends and patient chooses bed at      Patient to be transferred to [facility] on  .  Patient to be transferred to facility by       Patient family notified on   of transfer.  Name of family member notified:        PHYSICIAN       Additional Comment:    _______________________________________________ Standley Brooking,  LCSW 08/12/2014, 11:16 AM

## 2014-08-12 NOTE — Progress Notes (Signed)
Progress Note   [name] MOL:[id number] DOB: Mar 14, 1919 DOA: 08/08/2014 PCP: Jani Gravel, MD   Brief Narrative:   [name] is an 81 y.o. female with PMH of right fourth toe amputation, type 2 diabetes complicated by neuropathy and stage III-IV chronic kidney disease, hypertension, hypothyroidism who was admitted 08/08/14 with an infected right great toe wound.  Assessment/Plan:   Principal problem  Right great toe diabetic ulcer / right lower extremity cellulitis - Continue vancomycin and zosyn given ongoing RLE swelling. - Being followed by orthopedic surgery, s/p right foot first ray amputation 08/10/14. - Mobilize with PT. Social worker consulted for SNF placement.  ? Healthcare associated pneumonia Chest x-ray shows bilateral pulmonary infiltrate consistent with pneumonia Patient has been afebrile, normal white count. Check CBG in a.m. She has been on vancomycin and Zosyn Clinical picture more consistent with pulmonary edema than pneumonia   Questionable dysphasia - Nurse reports difficulty swallowing. Speech therapy evaluation done, patient only has mild risk of aspiration started on age-appropriate solids with thin liquids  Hyperkalemia - Resolved with Kayexalate 15 g given 08/10/14.  Diabetes mellitus with diabetic neuropathy both feet, CKD stage III - IV - Hemoglobin 10.1. - Currently being managed with resistant scale SSI Q AC/HS and 6 units meal coverage, and 30 units of Lantus daily.  - CBGs elevated at 165-350. We'll increase Lantus to 40 units daily and decrease correction to moderate scale. - Diabetes coordinator following. Recommendations noted. - Continue Neurontin for neuropathy.   Anemia of chronic disease, CKD, DM - Hemoglobin stable.  Acute on CKD stage III - IV - Baseline creatinine appears to be around 1.5. Current creatinine back to baseline values. - Lasix 20 mg IV every 12 hours  Acute on Chronic diastolic CHF, grade II per 2 D  ECHO 01/2012 -Patient was short of breath last night, chest x-ray shows pulmonary edema - Start Lasix 20 mg IV every 12 hours  HTN - Continue home medical regimen with Imdur, Coreg, Cardizem.  Hypothyroidism - Continue synthroid.   DVT prophylaxis  - Lovenox SQ.  Family Communication: No family at the bedside.  . Disposition Plan: SNF when bed available    Code Status Orders        Start     Ordered   08/08/14 1709  Full code   Continuous     08/08/14 1720    Advance Directive Documentation        Most Recent Value   Type of Advance Directive  Living will   Pre-existing out of facility DNR order (yellow form or pink MOST form)     "MOST" Form in Place?          IV Access:    Peripheral IV   Procedures and diagnostic studies:   08/10/14: Right first ray amputation  Medical Consultants:    Orthopedic Surgery  Anti-Infectives:    Vancomycin 08/08/14--->  Zosyn 08/08/14--->  Subjective:   [name] had shortness of breath episode last night. Denies shortness of breath at this time  Objective:    Filed Vitals:   08/12/14 0008 08/12/14 0150 08/12/14 0514 08/12/14 1337  BP: 182/67 136/42 114/32 147/53  Pulse:  82 72 80  Temp:   98 F (36.7 C) 98.1 F (36.7 C)  TempSrc:   Oral Oral  Resp:   20 20  Height:      Weight:   75.6 kg (166 lb 10.7 oz)   SpO2: 96% 99% 100%  100%    Intake/Output Summary (Last 24 hours) at 08/12/14 1820 Last data filed at 08/12/14 1336  Gross per 24 hour  Intake    920 ml  Output      0 ml  Net    920 ml    Exam: Gen:  Appears in no acute distress Cardiovascular:  S1 is regular in rate and rhythm, no murmurs Respiratory:  Decreased breath sounds  and crackles at lung bases Gastrointestinal:  Soft, nontender, no organomegaly Extremities:  RLE swollen compared to left, right foot bandaged   Data Reviewed:    Labs: Basic Metabolic Panel:  Recent Labs Lab 08/08/14 1411 08/09/14 0513 08/10/14 0413  08/11/14 0519  NA 130* 131* 131* 135  K 5.2* 5.0 5.4* 5.1  CL 95* 98* 98* 101  CO2 29 28 26 27   GLUCOSE 193* 244* 356* 205*  BUN 30* 29* 29* 26*  CREATININE 1.83* 1.82* 1.73* 1.52*  CALCIUM 8.4* 7.9* 7.8* 7.7*   GFR Estimated Creatinine Clearance: 23.3 mL/min (by C-G formula based on Cr of 1.52). Liver Function Tests:  Recent Labs Lab 08/08/14 1411  AST 10*  ALT 8*  ALKPHOS 73  BILITOT 0.1*  PROT 7.4  ALBUMIN 2.8*   CBC:  Recent Labs Lab 08/08/14 1411 08/09/14 0513 08/11/14 0519  WBC 11.5* 9.6 10.7*  NEUTROABS 8.3*  --   --   HGB 10.3* 9.3* 8.8*  HCT 31.8* 29.1* 28.1*  MCV 81.3 82.7 84.9  PLT 386 401* 438*   BNP (last 3 results)  Recent Labs  08/27/13 2127  PROBNP 231.0   CBG:  Recent Labs Lab 08/11/14 1734 08/11/14 2344 08/12/14 0738 08/12/14 1237 08/12/14 1632  GLUCAP 193* 126* 99 181* 242*    Microbiology Recent Results (from the past 240 hour(s))  Culture, blood (routine x 2)     Status: None (Preliminary result)   Collection Time: 08/08/14  6:35 PM  Result Value Ref Range Status   Specimen Description BLOOD BLOOD RIGHT ARM  Final   Special Requests BOTTLES DRAWN AEROBIC AND ANAEROBIC 10CC  Final   Culture   Final    NO GROWTH 4 DAYS Performed at [facility]    Report Status PENDING  Incomplete  Culture, blood (routine x 2)     Status: None (Preliminary result)   Collection Time: 08/08/14  6:45 PM  Result Value Ref Range Status   Specimen Description BLOOD BLOOD LEFT HAND  Final   Special Requests BOTTLES DRAWN AEROBIC ONLY [place]  Final   Culture   Final    NO GROWTH 4 DAYS Performed at [facility]    Report Status PENDING  Incomplete     Medications:   . aspirin EC  81 mg Oral Daily  . carvedilol  6.25 mg Oral BID WC  . diltiazem  180 mg Oral QHS  . feeding supplement (GLUCERNA SHAKE)  237 mL Oral TID BM  . furosemide  20 mg Intravenous Q12H  . gabapentin  300 mg Oral BID  . insulin aspart  0-15 Units  Subcutaneous TID WC  . insulin aspart  0-5 Units Subcutaneous QHS  . insulin aspart  6 Units Subcutaneous TID WC  . insulin glargine  40 Units Subcutaneous q morning - 10a  . isosorbide mononitrate  30 mg Oral QHS  . levothyroxine  175 mcg Oral QAC breakfast  . nutrition supplement (JUVEN)  1 packet Oral BID WC  . piperacillin-tazobactam (ZOSYN)  IV  3.375 g Intravenous Q8H  .  tamsulosin  0.4 mg Oral q morning - 10a  . vancomycin  750 mg Intravenous Q24H   Continuous Infusions: . sodium chloride 10 mL/hr at 08/11/14 0750    Time spent: 25 minutes.   LOS: 4 days   [facility] Pager [phone]. If unable to reach me by pager, please call my cell phone at [phone].    08/12/2014, 6:20 PM

## 2014-08-12 NOTE — Progress Notes (Signed)
Patient developed an episode of SOB, wheezing, elevated BP and low oxygen saturation overnight.  Notified NP on call who placed order for breathing treatment.  Patient responded well to breathing treatment.  BP normalized and sats in upper 90's on 3L.  Patient now has order for prn breathing treatments.  Will continue to monitor patient.

## 2014-08-12 NOTE — Progress Notes (Signed)
OT Cancellation Note  Patient Details Name: SAWYER KAHAN MRN: [id number] DOB: 16-May-1919   Cancelled Treatment:    Defer OT eval to SNF- thanks, Kari Baars, OT [phone] Payton Mccallum D 08/12/2014, 2:58 PM

## 2014-08-12 NOTE — Care Management Important Message (Signed)
Important Message  Patient Details  Name: SACHEEN ARRASMITH MRN: [id number] Date of Birth: 04-16-1919   Medicare Important Message Given:  Yes-second notification given    Camillo Flaming 08/12/2014, 11:56 AMImportant Message  Patient Details  Name: NATHASHA FIORILLO MRN: [id number] Date of Birth: 09/04/1919   Medicare Important Message Given:  Yes-second notification given    Camillo Flaming 08/12/2014, 11:56 AM

## 2014-08-12 NOTE — Evaluation (Signed)
Clinical/Bedside Swallow Evaluation Patient Details  Name: [name] MRN: [id number] Date of Birth: September 12, 1919  Today's Date: 08/12/2014 Time: SLP Start Time (ACUTE ONLY): 1245 SLP Stop Time (ACUTE ONLY): 1313 SLP Time Calculation (min) (ACUTE ONLY): 28 min  Past Medical History:  Past Medical History  Diagnosis Date  . Myocardial infarction   . Coronary artery disease   . Hypertension   . Dysrhythmia   . Peripheral vascular disease   . Hypothyroidism   . Diabetes mellitus   . Presbyesophagus 04/23/2011  . Urinary retention with incomplete bladder emptying 04/23/2011  . UTI (lower urinary tract infection) 04/21/2011  . Diastolic CHF, chronic     Grade I diastolic dysfunction on Echo 04/26/11  . Anaphylaxis 04/25/2011    Thought to be from Rocephin  . History of atrial flutter     ablation  . C. difficile colitis 04/17/2012  . Atrial flutter 11/01/2012   Past Surgical History:  Past Surgical History  Procedure Laterality Date  . Cholecystectomy    . Colonoscopy  01/15/2011    Procedure: COLONOSCOPY;  Surgeon: Beryle Beams, MD;  Location: [facility] ENDOSCOPY;  Service: Endoscopy;  Laterality: N/A;  . Amputation Right 04/14/2012    Procedure: AMPUTATION RAY;  Surgeon: Newt Minion, MD;  Location: [facility] ORS;  Service: Orthopedics;  Laterality: Right;  ankle block  . Cardiac electrophysiology mapping and ablation    . Nm myocar perf wall motion  01/23/2009    mild ischemia mid inferior & apical inferior.  EF 75%   HPI:  81 yo female adm to [facility] with Open wound of right great toe and osteomyelitis status post first ray amputation.  PMH + for DM, dysphagia, essential HTN, chronic renal dx stage 3.  Pt with CXR showing bilateral opacities. Patient developed an episode of SOB, wheezing, elevated BP and low oxygen saturation overnight and she responded well to breathing treatment per note. BP normalized and sats in upper 90's on 3L per note.  Swallow evaluation ordered.   Assessment / Plan /  Recommendation Clinical Impression  Pt has chronic multifactorial dysphagia based on prior esophagram in 2013 showing presbyesophagus and mild-mod oral deficits per MBS 2014.  CN exam overall unremarkable although pt is dysarthric.  Observed pt consuming fruit and cheese plate - no s/s of aspiration noted with intake.  Voice remained clear throughout.  Slow mastication noted - consumption appears effortful for pt and she tends to take more food before clearing oral cavity.  This "stuffing" of her mouth could result in aspiration of food from premature spillage into pharynx/larynx. Verbal cues to swallow and clear mouth before taking more helpful.  Pt self reports if she eats slowly, clears her mouth and consumes liquid with meals, she will not have "regurgitation". SLP cut up pt's fruit/cheese for her to maximize efficiency.    Educated pt to possible increased pulmonary ramifications with aspiration with age, comorbidities, decreased ambulation status, etc.    Reviewed prior MBS and UGI study with pt and provided written compensation strategies to mitigate her aspiration risk.  Informed pt to start with warm food items first to allow rest breaks if coughing or short of breath with meals.  Pt self reports she will normally cough and keep on eating prior to this admission.    Will sign off as all education to maximize airway protection completed using teach back and provided in writing.  A note was left for Bev Mahoney, pt's daughter, in the room.  Thanks for  this consult. .     Aspiration Risk  Mild (ongoing)    Diet Recommendation Age appropriate regular solids;Thin   Medication Administration: Whole meds with liquid Compensations: Slow rate;Small sips/bites;Follow solids with liquid;Other (Comment) (rest if short of breath or coughing)    Other  Recommendations   n/a  Follow Up Recommendations    n/a   Frequency and Duration  n/a      Pertinent Vitals/Pain Afebrile, decreased- pt with  abdominal muscle use for breathing      Swallow Study Prior Functional Status   see [place] Date of Onset: 08/12/14 Other Pertinent Information: 81 yo female adm to [facility] with Open wound of right great toe and osteomyelitis status post first ray amputation.  PMH + for DM, dysphagia, essential HTN, chronic renal dx stage 3.  Pt with CXR showing bilateral opacities. Patient developed an episode of SOB, wheezing, elevated BP and low oxygen saturation overnight and she responded well to breathing treatment per note. BP normalized and sats in upper 90's on 3L per note.  Swallow evaluation ordered. Type of Study: Bedside swallow evaluation Diet Prior to this Study: Regular;Thin liquids Temperature Spikes Noted: No Respiratory Status: Supplemental O2 delivered via (comment) History of Recent Intubation: No Behavior/Cognition: Alert;Cooperative;Pleasant mood Oral Cavity - Dentition: Adequate natural dentition/normal for age (artificial dentition) Self-Feeding Abilities: Able to feed self Patient Positioning: Upright in bed Baseline Vocal Quality: Normal Volitional Cough: Strong Volitional Swallow: Able to elicit    Oral/Motor/Sensory Function Overall Oral Motor/Sensory Function: Appears within functional limits for tasks assessed (pt dysarthric but no focal deficits apparent)   Ice Chips Ice chips: Not tested   Thin Liquid Thin Liquid: Within functional limits Presentation: Straw;Self Fed    Nectar Thick Nectar Thick Liquid: Not tested   Honey Thick Honey Thick Liquid: Not tested   Puree Puree: Not tested   Solid   GO    Solid: Impaired Oral Phase Impairments: Impaired mastication Oral Phase Functional Implications: Left lateral sulci pocketing;Oral residue Other Comments: residuals cleared with cued dry swallow        Luanna Salk, [facility] SLP [phone]

## 2014-08-13 DIAGNOSIS — L97519 Non-pressure chronic ulcer of other part of right foot with unspecified severity: Secondary | ICD-10-CM

## 2014-08-13 DIAGNOSIS — N183 Chronic kidney disease, stage 3 (moderate): Secondary | ICD-10-CM

## 2014-08-13 DIAGNOSIS — N189 Chronic kidney disease, unspecified: Secondary | ICD-10-CM

## 2014-08-13 DIAGNOSIS — L97509 Non-pressure chronic ulcer of other part of unspecified foot with unspecified severity: Secondary | ICD-10-CM | POA: Insufficient documentation

## 2014-08-13 DIAGNOSIS — N179 Acute kidney failure, unspecified: Secondary | ICD-10-CM

## 2014-08-13 LAB — CBC
HCT: 29.6 % — ABNORMAL LOW (ref 36.0–46.0)
Hemoglobin: 9.3 g/dL — ABNORMAL LOW (ref 12.0–15.0)
MCH: 26.1 pg (ref 26.0–34.0)
MCHC: 31.4 g/dL (ref 30.0–36.0)
MCV: 83.1 fL (ref 78.0–100.0)
Platelets: 446 10*3/uL — ABNORMAL HIGH (ref 150–400)
RBC: 3.56 MIL/uL — ABNORMAL LOW (ref 3.87–5.11)
RDW: 14.2 % (ref 11.5–15.5)
WBC: 10.7 10*3/uL — AB (ref 4.0–10.5)

## 2014-08-13 LAB — COMPREHENSIVE METABOLIC PANEL
ALT: 11 U/L — ABNORMAL LOW (ref 14–54)
AST: 12 U/L — ABNORMAL LOW (ref 15–41)
Albumin: 2.4 g/dL — ABNORMAL LOW (ref 3.5–5.0)
Alkaline Phosphatase: 56 U/L (ref 38–126)
Anion gap: 8 (ref 5–15)
BUN: 41 mg/dL — ABNORMAL HIGH (ref 6–20)
CO2: 30 mmol/L (ref 22–32)
CREATININE: 1.66 mg/dL — AB (ref 0.44–1.00)
Calcium: 8.1 mg/dL — ABNORMAL LOW (ref 8.9–10.3)
Chloride: 91 mmol/L — ABNORMAL LOW (ref 101–111)
GFR, EST AFRICAN AMERICAN: 30 mL/min — AB (ref >60)
GFR, EST NON AFRICAN AMERICAN: 25 mL/min — AB (ref >60)
Glucose, Bld: 262 mg/dL — ABNORMAL HIGH (ref 65–99)
Potassium: 4.5 mmol/L (ref 3.5–5.1)
SODIUM: 129 mmol/L — AB (ref 135–145)
Total Bilirubin: 0.4 mg/dL (ref 0.3–1.2)
Total Protein: 6.8 g/dL (ref 6.5–8.1)

## 2014-08-13 LAB — CULTURE, BLOOD (ROUTINE X 2)
Culture: NO GROWTH
Culture: NO GROWTH

## 2014-08-13 LAB — GLUCOSE, CAPILLARY
Glucose-Capillary: 176 mg/dL — ABNORMAL HIGH (ref 65–99)
Glucose-Capillary: 151 mg/dL — ABNORMAL HIGH (ref 65–99)

## 2014-08-13 MED ORDER — ALBUTEROL SULFATE (2.5 MG/3ML) 0.083% IN NEBU: 2.5000 mg | INHALATION_SOLUTION | RESPIRATORY_TRACT | Status: DC | PRN

## 2014-08-13 MED ORDER — LEVOFLOXACIN 500 MG PO TABS: 500.0000 mg | ORAL_TABLET | ORAL | Status: AC

## 2014-08-13 NOTE — Care Management Note (Addendum)
Case Management Note  Patient Details  Name: [name] MRN: [id number] Date of Birth: Jan 21, 1920  Subjective/Objective:                   Right great toe diabetic ulcer / right lower extremity cellulitis Action/Plan:  Discharge planning Expected Discharge Date: 08/14/14               Expected Discharge Plan:  Skilled Nursing Facility  In-House Referral:  Clinical Social Work  Discharge planning Services  CM Consult  Post Acute Care Choice:    Choice offered to:     DME Arranged:    DME Agency:     HH Arranged:    [place] Agency:     Status of Service:  Completed, signed off  Medicare Important Message Given:  Yes-second notification given Date Medicare IM Given:    Medicare IM give by:    Date Additional Medicare IM Given:    Additional Medicare Important Message give by:     If discussed at [place] of Stay Meetings, dates discussed:    Additional Comments: CM notes pt to go to SNF; CSW arranging.  No other CM needs were communicated. Dellie Catholic, RN 08/13/2014, 12:13 PM

## 2014-08-13 NOTE — Clinical Social Work Placement (Signed)
Patient is set to discharge to [facility] today. Patient & daughter, [name] aware. Discharge packet given to RN, Pam/Susan. Daughter to transport to SNF.    Raynaldo Opitz, [facility] Clinical Social Worker cell #: [phone]    CLINICAL SOCIAL WORK PLACEMENT  NOTE  Date:  08/13/2014  Patient Details  Name: [name] MRN: [id number] Date of Birth: [birth year]  Clinical Social Work is seeking post-discharge placement for this patient at the [place] level of care (*CSW will initial, date and re-position this form in  chart as items are completed):  Yes   Patient/family provided with [place] Work Department's list of facilities offering this level of care within the geographic area requested by the patient (or if unable, by the patient's family).  Yes   Patient/family informed of their freedom to choose among providers that offer the needed level of care, that participate in Medicare, Medicaid or managed care program needed by the patient, have an available bed and are willing to accept the patient.  Yes   Patient/family informed of [facility]'s ownership interest in [facility] and [facility], as well as of the fact that they are under no obligation to receive care at these facilities.  PASRR submitted to EDS on 08/12/14     PASRR number received on 08/12/14     Existing PASRR number confirmed on       FL2 transmitted to all facilities in geographic area requested by pt/family on 08/12/14     FL2 transmitted to all facilities within larger geographic area on       Patient informed that his/her managed care company has contracts with or will negotiate with certain facilities, including the following:        Yes   Patient/family informed of bed offers received.  Patient chooses bed at [facility]     Physician recommends and patient chooses bed at      Patient to be transferred to [facility]  on 08/13/14.  Patient to be transferred to facility by daughter's car     Patient family notified on 08/13/14 of transfer.  Name of family member notified:  patient's daughter, [name] via phone     PHYSICIAN       Additional Comment:    _______________________________________________ Standley Brooking, LCSW 08/13/2014, 1:29 PM

## 2014-08-13 NOTE — Progress Notes (Signed)
Inpatient Diabetes Program Recommendations  AACE/ADA: New Consensus Statement on Inpatient Glycemic Control (2013)  Target Ranges:  Prepandial:   less than 140 mg/dL      Peak postprandial:   less than 180 mg/dL (1-2 hours)      Critically ill patients:  140 - 180 mg/dL   Reason for Visit: Hyperglycemia  Results for ELANNA, BERT (MRN [id number]) as of 08/13/2014 08:43  Ref. Range 08/12/2014 12:37 08/12/2014 16:32 08/13/2014 07:39  Glucose-Capillary Latest Ref Range: 65-99 mg/dL 181 (H) 242 (H) 176 (H)  Results for SHANELLE, CLONTZ (MRN [id number]) as of 08/13/2014 08:43  Ref. Range 08/09/2014 05:13 08/10/2014 04:13 08/11/2014 05:19 08/13/2014 04:12  Glucose Latest Ref Range: 65-99 mg/dL 244 (H) 356 (H) 205 (H) 262 (H)   FBS continues to be > 200 mg/dL.  Recommendation: Increase Lantus to 45 units QAM.  Will continue to follow. Thank you. Lorenda Peck, RD, LDN, CDE Inpatient Diabetes Coordinator [phone]

## 2014-08-14 ENCOUNTER — Ambulatory Visit (HOSPITAL_COMMUNITY): Admission: RE | Admit: 2014-08-14 | Payer: PPO | Source: Ambulatory Visit

## 2014-08-14 ENCOUNTER — Encounter: Payer: Self-pay | Admitting: Internal Medicine

## 2014-08-14 ENCOUNTER — Non-Acute Institutional Stay (SKILLED_NURSING_FACILITY): Payer: PPO | Admitting: Internal Medicine

## 2014-08-14 DIAGNOSIS — N183 Chronic kidney disease, stage 3 unspecified: Secondary | ICD-10-CM

## 2014-08-14 DIAGNOSIS — I2510 Atherosclerotic heart disease of native coronary artery without angina pectoris: Secondary | ICD-10-CM

## 2014-08-14 DIAGNOSIS — D72829 Elevated white blood cell count, unspecified: Secondary | ICD-10-CM

## 2014-08-14 DIAGNOSIS — E1140 Type 2 diabetes mellitus with diabetic neuropathy, unspecified: Secondary | ICD-10-CM | POA: Diagnosis not present

## 2014-08-14 DIAGNOSIS — E1169 Type 2 diabetes mellitus with other specified complication: Secondary | ICD-10-CM

## 2014-08-14 DIAGNOSIS — I4892 Unspecified atrial flutter: Secondary | ICD-10-CM | POA: Diagnosis not present

## 2014-08-14 DIAGNOSIS — D62 Acute posthemorrhagic anemia: Secondary | ICD-10-CM | POA: Diagnosis not present

## 2014-08-14 DIAGNOSIS — E1165 Type 2 diabetes mellitus with hyperglycemia: Secondary | ICD-10-CM

## 2014-08-14 DIAGNOSIS — I5032 Chronic diastolic (congestive) heart failure: Secondary | ICD-10-CM | POA: Diagnosis not present

## 2014-08-14 DIAGNOSIS — I69359 Hemiplegia and hemiparesis following cerebral infarction affecting unspecified side: Secondary | ICD-10-CM

## 2014-08-14 DIAGNOSIS — I10 Essential (primary) hypertension: Secondary | ICD-10-CM | POA: Diagnosis not present

## 2014-08-14 DIAGNOSIS — E46 Unspecified protein-calorie malnutrition: Secondary | ICD-10-CM | POA: Diagnosis not present

## 2014-08-14 DIAGNOSIS — M9080 Osteopathy in diseases classified elsewhere, unspecified site: Secondary | ICD-10-CM

## 2014-08-14 DIAGNOSIS — R5381 Other malaise: Secondary | ICD-10-CM

## 2014-08-14 DIAGNOSIS — M869 Osteomyelitis, unspecified: Secondary | ICD-10-CM

## 2014-08-14 DIAGNOSIS — IMO0002 Reserved for concepts with insufficient information to code with codable children: Secondary | ICD-10-CM

## 2014-08-14 DIAGNOSIS — E039 Hypothyroidism, unspecified: Secondary | ICD-10-CM

## 2014-08-14 DIAGNOSIS — E871 Hypo-osmolality and hyponatremia: Secondary | ICD-10-CM

## 2014-08-14 NOTE — Anesthesia Postprocedure Evaluation (Addendum)
  Anesthesia Post-op Note  Patient: [name]  Procedure(s) Performed: Procedure(s) with comments: AMPUTATION RAY (Right) - ankle block Patient is awake and responsive. Pain and nausea are reasonably well controlled. Vital signs are stable and clinically acceptable. Oxygen saturation is clinically acceptable. There are no apparent anesthetic complications at this time. Patient is ready for discharge.

## 2014-08-14 NOTE — Progress Notes (Signed)
Patient ID: [name], female   DOB: 15-Sep-1919, 81 y.o.   MRN: [id number]     [place]  PCP: Jani Gravel, MD  Code Status: Full Code   Allergies  Allergen Reactions  . Ephedrine     Unknown.    . Morphine Other (See Comments)    Difficulty waking up & talking after surgery  . Rocephin [Ceftriaxone Sodium In Dextrose]     Developed acute resp distress, wheezing after 2nd dose.    Chief Complaint  Patient presents with  . New Admit To SNF    New Admission      HPI:  81 y.o. patient is here for short term rehabilitation post hospital admission from 08/08/14-08/13/14 with with right great toe osteomyelitis. She underwent first ray amputation. She has PMH of type 2 DM with neuropathy, ckd stage 4, HTN, hypothyroidism among others. She was on iv antibitoics vancomycin and zosyn which was switched to po levaquin at discharge. There was concern for HCAP with cxr showing possible pulmonary infiltrate but was later thought to be from pulmonary edema. She is seen in her room today. She has dysarthria from old CVA. She denies any concerns this am. Her pain is under control. Her appetite is good. No new nursing concern at present.  Review of Systems:  Constitutional: Negative for fever, chills, diaphoresis. positive for generalized weakness. HENT: Negative for headache, congestion, nasal discharge Eyes: Negative for eye pain, blurred vision, double vision and discharge.  Respiratory: Negative for cough, shortness of breath and wheezing.   Cardiovascular: Negative for chest pain, palpitations, leg swelling.  Gastrointestinal: Negative for heartburn, nausea, vomiting, abdominal pain. Had a bowel movement yesterday, denies diarrhea. Genitourinary: Negative for dysuria Musculoskeletal: Negative for back pain, falls in the facility Skin: Negative for itching, rash.  Neurological: Negative for dizziness, tingling, focal weakness Psychiatric/Behavioral: Negative for  depression    Past Medical History  Diagnosis Date  . Myocardial infarction   . Coronary artery disease   . Hypertension   . Dysrhythmia   . Peripheral vascular disease   . Hypothyroidism   . Diabetes mellitus   . Presbyesophagus 04/23/2011  . Urinary retention with incomplete bladder emptying 04/23/2011  . UTI (lower urinary tract infection) 04/21/2011  . Diastolic CHF, chronic     Grade I diastolic dysfunction on Echo 04/26/11  . Anaphylaxis 04/25/2011    Thought to be from Rocephin  . History of atrial flutter     ablation  . C. difficile colitis 04/17/2012  . Atrial flutter 11/01/2012   Past Surgical History  Procedure Laterality Date  . Cholecystectomy    . Colonoscopy  01/15/2011    Procedure: COLONOSCOPY;  Surgeon: Beryle Beams, MD;  Location: [facility];  Service: Endoscopy;  Laterality: N/A;  . Amputation Right 04/14/2012    Procedure: AMPUTATION RAY;  Surgeon: Newt Minion, MD;  Location: [facility];  Service: Orthopedics;  Laterality: Right;  ankle block  . Cardiac electrophysiology mapping and ablation    . Nm myocar perf wall motion  01/23/2009    mild ischemia mid inferior & apical inferior.  EF 75%  . Amputation Right 08/10/2014    Procedure: AMPUTATION RAY;  Surgeon: Mcarthur Rossetti, MD;  Location: [facility];  Service: Orthopedics;  Laterality: Right;  ankle block   Social History:   reports that she quit smoking about 33 years ago. Her smoking use included Cigarettes. She has never used smokeless tobacco. She reports that she drinks  alcohol. She reports that she does not use illicit drugs.  Family History  Problem Relation Age of Onset  . Cancer Sister   . Diabetes Mother   . Heart disease Father     Medications:   Medication List       This list is accurate as of: 08/14/14  8:48 AM.  Always use your most recent med list.               aspirin 81 MG tablet  Take 81 mg by mouth daily.     carvedilol 6.25 MG tablet  Commonly known as:  COREG    Take 6.25 mg by mouth 2 (two) times daily with a meal.     diltiazem 180 MG 24 hr capsule  Commonly known as:  CARDIZEM CD  Take 180 mg by mouth at bedtime.     furosemide 20 MG tablet  Commonly known as:  LASIX  Take 20 mg by mouth 2 (two) times daily. Take one tablet in the morning and one tablet in the evening.     gabapentin 300 MG capsule  Commonly known as:  NEURONTIN  Take 300 mg by mouth 2 (two) times daily.     insulin aspart 100 UNIT/ML injection  Commonly known as:  novoLOG  <70(0 units), 70-140 (0 units),141-180 (2 units), 181-220(4 units), 221-260 (6 units), 261-300(8 units), 301-340(10 units), >340 (12 units) notify MD     insulin glargine 100 UNIT/ML injection  Commonly known as:  LANTUS  Inject 0.4 mLs (40 Units total) into the skin every morning.     isosorbide mononitrate 30 MG 24 hr tablet  Commonly known as:  IMDUR  Take 30 mg by mouth at bedtime.     levofloxacin 500 MG tablet  Commonly known as:  LEVAQUIN  Take 1 tablet (500 mg total) by mouth every other day.     levothyroxine 175 MCG tablet  Commonly known as:  SYNTHROID, LEVOTHROID  Take 175 mcg by mouth daily before breakfast.     multivitamin tablet  Take 1 tablet by mouth daily.     nutrition supplement (JUVEN) Pack  Take 1 packet by mouth 2 (two) times daily with a meal.     tamsulosin 0.4 MG Caps capsule  Commonly known as:  FLOMAX  Take 0.4 mg by mouth every morning.     UNABLE TO FIND  Med Name: Med Pass     VITAMIN E PO  Take 1 capsule by mouth daily.         Physical Exam: Filed Vitals:   08/14/14 0833  BP: 127/66  Pulse: 72  Temp: 96.7 F (35.9 C)  TempSrc: Oral  Resp: 19  SpO2: 92%    General- elderly female, frail, in no acute distress Head- normocephalic, atraumatic Throat- moist mucus membrane, upper and lower dentures Eyes- no pallor, no icterus, no discharge, normal conjunctiva, normal sclera Neck- no cervical lymphadenopathy, no jugular vein  distension Cardiovascular- normal s1,s2, no murmurs, trace leg edema Respiratory- bilateral clear to auscultation, no wheeze, no rhonchi, no crackles, no use of accessory muscles Abdomen- bowel sounds present, soft, non tender Musculoskeletal- able to move all 4 extremities, right sided weakness from old CVA, on wheelchair Neurological- alert and oriented to person, place and time, has dysarthria and right sided weakness Skin- warm and dry, right foot with ace wrap dressing clean Psychiatry- normal mood and affect    Labs reviewed: Basic Metabolic Panel:  Recent Labs  11/18/13 0810  08/10/14  0413 08/11/14 0519 08/13/14 0412  NA 135*  < > 131* 135 129*  K 4.9  < > 5.4* 5.1 4.5  CL 95*  < > 98* 101 91*  CO2 27  < > 26 27 30   GLUCOSE 228*  < > 356* 205* 262*  BUN 27*  < > 29* 26* 41*  CREATININE 1.27*  < > 1.73* 1.52* 1.66*  CALCIUM 9.7  < > 7.8* 7.7* 8.1*  MG 1.6  --   --   --   --   < > = values in this interval not displayed. Liver Function Tests:  Recent Labs  11/18/13 1523 08/08/14 1411 08/13/14 0412  AST 10 10* 12*  ALT 7 8* 11*  ALKPHOS 58 73 56  BILITOT 0.3 0.1* 0.4  PROT 7.0 7.4 6.8  ALBUMIN 3.0* 2.8* 2.4*    Recent Labs  08/27/13 2127  LIPASE 11   No results for input(s): AMMONIA in the last 8760 hours. CBC:  Recent Labs  08/27/13 2127  11/18/13 0810  08/08/14 1411 08/09/14 0513 08/11/14 0519 08/13/14 0412  WBC 11.9*  < > 9.2  < > 11.5* 9.6 10.7* 10.7*  NEUTROABS 9.4*  --  6.6  --  8.3*  --   --   --   HGB 12.7  < > 12.8  < > 10.3* 9.3* 8.8* 9.3*  HCT 37.0  < > 39.3  < > 31.8* 29.1* 28.1* 29.6*  MCV 82.2  < > 84.3  < > 81.3 82.7 84.9 83.1  PLT 348  < > 311  < > 386 401* 438* 446*  < > = values in this interval not displayed. Cardiac Enzymes:  Recent Labs  08/27/13 2127  TROPONINI <0.30   BNP: Invalid input(s): POCBNP CBG:  Recent Labs  08/12/14 1632 08/13/14 0739 08/13/14 1159  GLUCAP 242* 176* 151*    Radiological  Exams: Dg Chest 2 View  08/08/2014   CLINICAL DATA:  Chronic wound involving the right great toe.  EXAM: CHEST  2 VIEW  COMPARISON:  08/27/2013  FINDINGS: The heart is borderline enlarged but stable. The mediastinal and hilar contours are within normal limits and unchanged. There is mild tortuosity and calcification of the thoracic aorta. There are chronic bronchitic type interstitial lung changes but no definite acute overlying pulmonary process. No pleural effusion. Remote healed rib fractures are noted along with remote thoracic compression fractures.  IMPRESSION: Chronic lung changes but no acute pulmonary findings.   Electronically Signed   By: Marijo Sanes M.D.   On: 08/08/2014 14:39   Dg Foot Complete Right  08/08/2014   CLINICAL DATA:  81 year old female with with redness of the right foot and ulcer on the great toe.  EXAM: RIGHT FOOT COMPLETE - 3+ VIEW  COMPARISON:  Radiograph dated 07/04/2014  FINDINGS: There is stable appearing amputation of the fourth toe. There is an old fracture deformity of the third metatarsal. No acute fracture or dislocation identified. There is mild hallux valgus. There is diffuse osteopenia.  There is diffuse soft tissue swelling and edema. A skin ulcer is noted along the medial aspect of the great toe. An ulcer is also noted adjacent to the first metatarsophalangeal joint. No definite periosteal reaction or radiographic signs of osteomyelitis identified. MRI or white blood cell nuclear scan may provide better evaluation if there is high clinical suspicion for acute osteomyelitis.  IMPRESSION: Diffuse soft tissue swelling with skin ulcers along the medial aspect of the great toe. No definite  radiographic signs of osteomyelitis. MRI may provide better evaluation if clinically indicated   Electronically Signed   By: Anner Crete M.D.   On: 08/08/2014 19:25    Assessment/Plan  Physical deconditioning With recent osteomyelitis, surgery and medical co-moridities. Will  have patient work with PT/OT as tolerated to regain strength and restore function.  Fall precautions are in place.  Right great toe osteomyelitis S/p first ray amputation. To continue levaquin 500 mg qod until 08/22/14. Has follow up with orthopedics. Continue wound care. Not on any medication for pain, currently not in pain. Will have her on tylenol 650 mg q8h prn for pain. WBAT.  Blood loss anemia Post op, hb 9.3. Monitor h&h  Leukocytosis With current infection, improving, afebrile, monitor wbc curve  Hyponatremia Currently on lasix, alert and oriented, monitor cbc  ckd stage 4 Cr 1.6. Monitor renal function  Protein calorie malnutrition Monitor weight. Continue medpass supplement  HTN Stable bp reading. Continue coreg 6.25 mg bid with imdur and lasix and monitor bp readings.  Atrial flutter Rate controlled at present, continue coreg 6.25 mg bid with cardizem cd 180 mg daily  CHF Appears euvolemic for now. Continue coreg 6.25 mg bid, lasix 20 mg bid, imdur 30 mg daily. Monitor her weight. Check bmp  Dm type 2 with neuropathy Lab Results  Component Value Date   HGBA1C 10.1* 08/09/2014  Monitor cbg. Continue lantus 40 u daily with SSI. continue neurontin 300 mg bid.  CAD Remains chest pain free. Continue coreg and imdur with baby aspirin.   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 Not on statin for unclear reason.     Goals of care: short term rehabilitation   Labs/tests ordered: cbc with diff, cmp 1 week  Family/ staff Communication: reviewed care plan with patient and nursing supervisor    Blanchie Serve, MD  [facility] [phone] (Monday-Friday 8 am - 5 pm) [phone] (afterhours)

## 2014-08-29 ENCOUNTER — Encounter: Payer: Self-pay | Admitting: Adult Health

## 2014-08-29 ENCOUNTER — Non-Acute Institutional Stay (SKILLED_NURSING_FACILITY): Payer: PPO | Admitting: Adult Health

## 2014-08-29 DIAGNOSIS — I2510 Atherosclerotic heart disease of native coronary artery without angina pectoris: Secondary | ICD-10-CM | POA: Diagnosis not present

## 2014-08-29 DIAGNOSIS — M9080 Osteopathy in diseases classified elsewhere, unspecified site: Secondary | ICD-10-CM

## 2014-08-29 DIAGNOSIS — E039 Hypothyroidism, unspecified: Secondary | ICD-10-CM | POA: Diagnosis not present

## 2014-08-29 DIAGNOSIS — D62 Acute posthemorrhagic anemia: Secondary | ICD-10-CM | POA: Diagnosis not present

## 2014-08-29 DIAGNOSIS — I69359 Hemiplegia and hemiparesis following cerebral infarction affecting unspecified side: Secondary | ICD-10-CM | POA: Diagnosis not present

## 2014-08-29 DIAGNOSIS — E1165 Type 2 diabetes mellitus with hyperglycemia: Secondary | ICD-10-CM

## 2014-08-29 DIAGNOSIS — IMO0002 Reserved for concepts with insufficient information to code with codable children: Secondary | ICD-10-CM

## 2014-08-29 DIAGNOSIS — R339 Retention of urine, unspecified: Secondary | ICD-10-CM

## 2014-08-29 DIAGNOSIS — E871 Hypo-osmolality and hyponatremia: Secondary | ICD-10-CM

## 2014-08-29 DIAGNOSIS — I5032 Chronic diastolic (congestive) heart failure: Secondary | ICD-10-CM

## 2014-08-29 DIAGNOSIS — E1169 Type 2 diabetes mellitus with other specified complication: Secondary | ICD-10-CM | POA: Diagnosis not present

## 2014-08-29 DIAGNOSIS — I10 Essential (primary) hypertension: Secondary | ICD-10-CM

## 2014-08-29 DIAGNOSIS — E46 Unspecified protein-calorie malnutrition: Secondary | ICD-10-CM

## 2014-08-29 DIAGNOSIS — E1140 Type 2 diabetes mellitus with diabetic neuropathy, unspecified: Secondary | ICD-10-CM

## 2014-08-29 DIAGNOSIS — N183 Chronic kidney disease, stage 3 unspecified: Secondary | ICD-10-CM

## 2014-08-29 DIAGNOSIS — I4892 Unspecified atrial flutter: Secondary | ICD-10-CM

## 2014-08-29 DIAGNOSIS — M869 Osteomyelitis, unspecified: Secondary | ICD-10-CM

## 2014-08-29 DIAGNOSIS — R5381 Other malaise: Secondary | ICD-10-CM

## 2014-08-29 NOTE — Progress Notes (Signed)
Patient ID: [name], female   DOB: 1919/02/20, 81 y.o.   MRN: [id number]    DATE:  08/28/14 MRN:  [id number]  BIRTHDAY: 12/13/1919  Facility:  Nursing Home Location:  [place] Room Number: 665-L  LEVEL OF CARE:  SNF (31)  Contact Information    Name [place] Daughter [phone]  [phone]       Chief Complaint  Patient presents with  . Discharge Note    physical deconditioning, right great toe osteomyelitis S/P first ray amputation, anemia, hyponatremia, CKD, protein calorie malnutrition, hypertension, atrial flutter, CHF, diabetes mellitus, CAD, old CVA, urinary retention and hypothyroidism    HISTORY OF PRESENT ILLNESS:  This is a 81 year old female who is for discharge home with home health PT for endurance and OT for ADLs. She has been admitted to [facility] on 08/13/14 from [facility]. She had infected right great toe wound and pressumed osteomyelitis with elevated ESR/CRP.she had right foot first ray amputation on 08/10/14.  Patient was admitted to this facility for short-term rehabilitation after the patient's recent hospitalization.  Patient has completed SNF rehabilitation and therapy has cleared the patient for discharge.  PAST MEDICAL HISTORY:  Past Medical History  Diagnosis Date  . Myocardial infarction   . Coronary artery disease   . Hypertension   . Dysrhythmia   . Peripheral vascular disease   . Hypothyroidism   . Diabetes mellitus   . Presbyesophagus 04/23/2011  . Urinary retention with incomplete bladder emptying 04/23/2011  . UTI (lower urinary tract infection) 04/21/2011  . Diastolic CHF, chronic     Grade I diastolic dysfunction on Echo 04/26/11  . Anaphylaxis 04/25/2011    Thought to be from Rocephin  . History of atrial flutter     ablation  . C. difficile colitis 04/17/2012  . Atrial flutter 11/01/2012     CURRENT MEDICATIONS: Reviewed  Patient's Medications    New Prescriptions   No medications on file  Previous Medications   ASPIRIN 81 MG TABLET    Take 81 mg by mouth daily.   CARVEDILOL (COREG) 6.25 MG TABLET    Take 6.25 mg by mouth 2 (two) times daily with a meal.     DILTIAZEM (CARDIZEM CD) 180 MG 24 HR CAPSULE    Take 180 mg by mouth at bedtime.    FUROSEMIDE (LASIX) 20 MG TABLET    Take 20 mg by mouth 2 (two) times daily. Take one tablet in the morning and one tablet in the evening.   GABAPENTIN (NEURONTIN) 300 MG CAPSULE    Take 300 mg by mouth 2 (two) times daily.   INSULIN ASPART (NOVOLOG) 100 UNIT/ML INJECTION    <70(0 units), 70-140 (0 units),141-180 (2 units), 181-220(4 units), 221-260 (6 units), 261-300(8 units), 301-340(10 units), >340 (12 units) notify MD   INSULIN GLARGINE (LANTUS) 100 UNIT/ML INJECTION    Inject 0.4 mLs (40 Units total) into the skin every morning.   ISOSORBIDE MONONITRATE (IMDUR) 30 MG 24 HR TABLET    Take 30 mg by mouth at bedtime.    LEVOTHYROXINE (SYNTHROID, LEVOTHROID) 175 MCG TABLET    Take 175 mcg by mouth daily before breakfast.   MULTIPLE VITAMIN (MULTIVITAMIN) TABLET    Take 1 tablet by mouth daily.   NUTRITION SUPPLEMENT, JUVEN, (JUVEN) PACK    Take 1 packet by mouth 2 (two) times daily with a meal.   TAMSULOSIN HCL (FLOMAX) 0.4 MG CAPS  Take 0.4 mg by mouth every morning.    UNABLE TO FIND    Med Name: Med Pass   VITAMIN E PO    Take 1 capsule by mouth daily.  Modified Medications   No medications on file  Discontinued Medications   No medications on file     Allergies  Allergen Reactions  . Ephedrine     Unknown.    . Morphine Other (See Comments)    Difficulty waking up & talking after surgery  . Rocephin [Ceftriaxone Sodium In Dextrose]     Developed acute resp distress, wheezing after 2nd dose.     REVIEW OF SYSTEMS:  GENERAL: no change in appetite, no fatigue, no weight changes, no fever, chills or weakness EYES: Denies change in vision, dry eyes, eye pain, itching or  discharge EARS: Denies change in hearing, ringing in ears, or earache NOSE: Denies nasal congestion or epistaxis MOUTH and THROAT: Denies oral discomfort, gingival pain or bleeding, pain from teeth or hoarseness   RESPIRATORY: no cough, SOB, DOE, wheezing, hemoptysis CARDIAC: no chest pain, edema or palpitations GI: no abdominal pain, diarrhea, constipation, heart burn, nausea or vomiting GU: Denies dysuria, frequency, hematuria, incontinence, or discharge MUSCULOSKELETAL: Denies joit pain, muscle pain, back pain, restricted movement, or unusual weakness CIRCULATION: Denies claudication, edema of legs, varicosities, or cold extremities NEUROLOGICAL: Denies dizziness, syncope, numbness, or headache PSYCHIATRIC: Denies feeling of depression or anxiety. No report of hallucinations, insomnia, paranoia, or agitation ENDOCRINE: Denies polyphagia, polyuria, polydipsia, heat or cold intolerance HEME/LYMPH: Denies excessive bruising, petechia, enlarged lymph nodes, or bleeding problems IMMUNOLOGIC: Denies history of frequent infections, AIDS, or use of immunosuppressive agents    PHYSICAL EXAMINATION  GENERAL APPEARANCE: Well nourished. In no acute distress. Normal body habitus SKIN:  Right foot 1st great toe amputation site is dry, no erythema, dressing is dry HEAD: Normal in size and contour. No evidence of trauma EYES: Lids open and close normally. No blepharitis, entropion or ectropion. PERRL. Conjunctivae are clear and sclerae are white. Lenses are without opacity EARS: Pinnae are normal. Patient hears normal voice tunes of the examiner MOUTH and THROAT: Lips are without lesions. Oral mucosa is moist and without lesions. Tongue is normal in shape, size, and color and without lesions NECK: supple, trachea midline, no neck masses, no thyroid tenderness, no thyromegaly LYMPHATICS: no LAN in the neck, no supraclavicular LAN RESPIRATORY: breathing is even & unlabored, BS CTAB CARDIAC: RRR, no  murmur,no extra heart sounds, no edema GI: abdomen soft, normal BS, no masses, no tenderness, no hepatomegaly, no splenomegaly EXTREMITIES:  Right hemiparesis PSYCHIATRIC: Alert and oriented X 3. Affect and behavior are appropriate  LABS/RADIOLOGY: Labs reviewed: 08/22/14  WBC 9.1 hemoglobin 9.4 hematocrit 29.7 MCV 80.9 platelet 364 08/21/14  WBC 8.0 hemoglobin 10.5 hematocrit 32.7 MCV 79.2 platelet 450 sodium 134 potassium 4.6 glucose 73 BUN 69 creatinine 1.70 total bilirubin 0.3 alkaline phosphatase 62 SGOT 14 SGPT 7 total protein 7.4 albumin 0.4 calcium 9.2 Basic Metabolic Panel:  Recent Labs  11/18/13 0810  08/10/14 0413 08/11/14 0519 08/13/14 0412  NA 135*  < > 131* 135 129*  K 4.9  < > 5.4* 5.1 4.5  CL 95*  < > 98* 101 91*  CO2 27  < > $R'26 27 30  'tw$ GLUCOSE 228*  < > 356* 205* 262*  BUN 27*  < > 29* 26* 41*  CREATININE 1.27*  < > 1.73* 1.52* 1.66*  CALCIUM 9.7  < > 7.8* 7.7* 8.1*  MG 1.6  --   --   --   --   < > =  values in this interval not displayed. Liver Function Tests:  Recent Labs  11/18/13 1523 08/08/14 1411 08/13/14 0412  AST 10 10* 12*  ALT 7 8* 11*  ALKPHOS 58 73 56  BILITOT 0.3 0.1* 0.4  PROT 7.0 7.4 6.8  ALBUMIN 3.0* 2.8* 2.4*   CBC:  Recent Labs  11/18/13 0810  08/08/14 1411 08/09/14 0513 08/11/14 0519 08/13/14 0412  WBC 9.2  < > 11.5* 9.6 10.7* 10.7*  NEUTROABS 6.6  --  8.3*  --   --   --   HGB 12.8  < > 10.3* 9.3* 8.8* 9.3*  HCT 39.3  < > 31.8* 29.1* 28.1* 29.6*  MCV 84.3  < > 81.3 82.7 84.9 83.1  PLT 311  < > 386 401* 438* 446*  < > = values in this interval not displayed.  CBG:  Recent Labs  08/12/14 1632 08/13/14 0739 08/13/14 1159  GLUCAP 242* 176* 151*      Dg Chest 2 View  08/12/2014   CLINICAL DATA:  Shortness of breath and wheezing.  EXAM: CHEST  2 VIEW  COMPARISON:  None.  FINDINGS: Mediastinum and hilar structures are normal. Borderline cardiomegaly. Bilateral pulmonary infiltrates are noted consistent with pneumonia. A  component of congestive heart failure cannot be excluded. Tiny pleural effusions cannot be excluded. No pneumothorax. Scratched thoracic and upper lumbar vertebral body compression fractures are noted. These are stable.  IMPRESSION: 1. Prominent bilateral pulmonary infiltrates most consistent with pneumonia.  2. Borderline cardiomegaly. A component of congestive heart failure cannot be excluded .   Electronically Signed   By: Marcello Moores  Register   On: 08/12/2014 09:46   Dg Chest 2 View  08/08/2014   CLINICAL DATA:  Chronic wound involving the right great toe.  EXAM: CHEST  2 VIEW  COMPARISON:  08/27/2013  FINDINGS: The heart is borderline enlarged but stable. The mediastinal and hilar contours are within normal limits and unchanged. There is mild tortuosity and calcification of the thoracic aorta. There are chronic bronchitic type interstitial lung changes but no definite acute overlying pulmonary process. No pleural effusion. Remote healed rib fractures are noted along with remote thoracic compression fractures.  IMPRESSION: Chronic lung changes but no acute pulmonary findings.   Electronically Signed   By: Marijo Sanes M.D.   On: 08/08/2014 14:39   Dg Foot Complete Right  08/08/2014   CLINICAL DATA:  81 year old female with with redness of the right foot and ulcer on the great toe.  EXAM: RIGHT FOOT COMPLETE - 3+ VIEW  COMPARISON:  Radiograph dated 07/04/2014  FINDINGS: There is stable appearing amputation of the fourth toe. There is an old fracture deformity of the third metatarsal. No acute fracture or dislocation identified. There is mild hallux valgus. There is diffuse osteopenia.  There is diffuse soft tissue swelling and edema. A skin ulcer is noted along the medial aspect of the great toe. An ulcer is also noted adjacent to the first metatarsophalangeal joint. No definite periosteal reaction or radiographic signs of osteomyelitis identified. MRI or white blood cell nuclear scan may provide better  evaluation if there is high clinical suspicion for acute osteomyelitis.  IMPRESSION: Diffuse soft tissue swelling with skin ulcers along the medial aspect of the great toe. No definite radiographic signs of osteomyelitis. MRI may provide better evaluation if clinically indicated   Electronically Signed   By: Anner Crete M.D.   On: 08/08/2014 19:25    ASSESSMENT/PLAN:  Physical deconditioning - for home health PT and OT  Right great toe osteomyelitis S/P first ray amputation - S/P Levaquin treatment; follow-up with Dr. Marcene Duos, orthopedic surgeon  Anemia, acute blood loss - hemoglobin 9.4; stable  Hyponatremia - sodium 134; stable  Chronic kidney disease stage III - creatinine 1.7; stable  Protein calorie malnutrition - albumin 3.4; continue supplementation  Hypertension - continue Coreg 6.25 mg by mouth twice a day, into her 30 mg ER 1 tab by mouth daily at bedtime, diltiazem CD 180 mg 1 capsule by mouth daily and Lasix 20 mg 1 tab by mouth daily  Atrial flutter - rate controlled; continue Coreg 6.25 mg by mouth twice a day and diltiazem CD 180 mg 1 capsule by mouth daily  Chronic diastolic CHF - continue Coreg 6.25 mg by mouth twice a day, Lasix 20 mg by mouth twice a day and into her 30 mg by mouth daily  Diabetes mellitus, type II with neuropathy - hemoglobin A1c 10.1; continue Lantus 40 units subcutaneous daily with insulin lispro SSI 3 times a day before meals  CAD - stable; continue Coreg, Imdur and aspirin  Hypothyroidism - TSH 0.502; continue Synthroid 175 g 1 tab by mouth daily  History of CVA with right hemiparesis - continue aspirin 81 mg by mouth daily  Urinary retention - continue Flomax 0.4 mg 1 capsule by mouth daily    I have filled out patient's discharge paperwork and written prescriptions.  Patient will receive home health PT and OT.  Total discharge time: Less than 30 minutes  Discharge time involved coordination of the discharge process with  Education officer, museum, nursing staff and therapy department. Medical justification for home health services verified.    [facility], NP [employer] [phone]

## 2014-09-07 ENCOUNTER — Other Ambulatory Visit: Payer: Self-pay | Admitting: Cardiovascular Disease

## 2014-09-09 NOTE — Telephone Encounter (Signed)
REFILL 

## 2014-10-14 ENCOUNTER — Other Ambulatory Visit (HOSPITAL_COMMUNITY): Payer: Self-pay | Admitting: Orthopaedic Surgery

## 2014-10-21 ENCOUNTER — Emergency Department (HOSPITAL_COMMUNITY): Payer: PPO

## 2014-10-21 ENCOUNTER — Other Ambulatory Visit: Payer: Self-pay

## 2014-10-21 ENCOUNTER — Encounter (HOSPITAL_COMMUNITY): Payer: Self-pay | Admitting: Emergency Medicine

## 2014-10-21 ENCOUNTER — Encounter (HOSPITAL_COMMUNITY): Payer: Self-pay

## 2014-10-21 ENCOUNTER — Inpatient Hospital Stay (HOSPITAL_COMMUNITY)
Admission: EM | Admit: 2014-10-21 | Discharge: 2014-10-25 | DRG: 987 | Disposition: A | Discharge status: Skilled Nursing Facility | Payer: PPO | Attending: Internal Medicine | Admitting: Internal Medicine

## 2014-10-21 DIAGNOSIS — L03115 Cellulitis of right lower limb: Secondary | ICD-10-CM | POA: Diagnosis present

## 2014-10-21 DIAGNOSIS — E1140 Type 2 diabetes mellitus with diabetic neuropathy, unspecified: Secondary | ICD-10-CM | POA: Diagnosis present

## 2014-10-21 DIAGNOSIS — Z794 Long term (current) use of insulin: Secondary | ICD-10-CM | POA: Diagnosis not present

## 2014-10-21 DIAGNOSIS — Z9049 Acquired absence of other specified parts of digestive tract: Secondary | ICD-10-CM | POA: Diagnosis present

## 2014-10-21 DIAGNOSIS — Z87891 Personal history of nicotine dependence: Secondary | ICD-10-CM

## 2014-10-21 DIAGNOSIS — E1129 Type 2 diabetes mellitus with other diabetic kidney complication: Secondary | ICD-10-CM | POA: Diagnosis present

## 2014-10-21 DIAGNOSIS — E11621 Type 2 diabetes mellitus with foot ulcer: Secondary | ICD-10-CM | POA: Diagnosis present

## 2014-10-21 DIAGNOSIS — E039 Hypothyroidism, unspecified: Secondary | ICD-10-CM | POA: Diagnosis present

## 2014-10-21 DIAGNOSIS — E860 Dehydration: Secondary | ICD-10-CM | POA: Diagnosis present

## 2014-10-21 DIAGNOSIS — E1122 Type 2 diabetes mellitus with diabetic chronic kidney disease: Secondary | ICD-10-CM | POA: Diagnosis present

## 2014-10-21 DIAGNOSIS — Z7982 Long term (current) use of aspirin: Secondary | ICD-10-CM | POA: Diagnosis not present

## 2014-10-21 DIAGNOSIS — N320 Bladder-neck obstruction: Secondary | ICD-10-CM | POA: Diagnosis present

## 2014-10-21 DIAGNOSIS — L97529 Non-pressure chronic ulcer of other part of left foot with unspecified severity: Secondary | ICD-10-CM | POA: Diagnosis present

## 2014-10-21 DIAGNOSIS — Z888 Allergy status to other drugs, medicaments and biological substances status: Secondary | ICD-10-CM

## 2014-10-21 DIAGNOSIS — I5032 Chronic diastolic (congestive) heart failure: Secondary | ICD-10-CM | POA: Diagnosis not present

## 2014-10-21 DIAGNOSIS — R0789 Other chest pain: Secondary | ICD-10-CM | POA: Diagnosis present

## 2014-10-21 DIAGNOSIS — E1165 Type 2 diabetes mellitus with hyperglycemia: Secondary | ICD-10-CM | POA: Diagnosis present

## 2014-10-21 DIAGNOSIS — N184 Chronic kidney disease, stage 4 (severe): Secondary | ICD-10-CM | POA: Diagnosis present

## 2014-10-21 DIAGNOSIS — E1340 Other specified diabetes mellitus with diabetic neuropathy, unspecified: Secondary | ICD-10-CM

## 2014-10-21 DIAGNOSIS — J9601 Acute respiratory failure with hypoxia: Secondary | ICD-10-CM | POA: Diagnosis present

## 2014-10-21 DIAGNOSIS — I10 Essential (primary) hypertension: Secondary | ICD-10-CM

## 2014-10-21 DIAGNOSIS — J189 Pneumonia, unspecified organism: Principal | ICD-10-CM | POA: Diagnosis present

## 2014-10-21 DIAGNOSIS — Z89411 Acquired absence of right great toe: Secondary | ICD-10-CM

## 2014-10-21 DIAGNOSIS — E11628 Type 2 diabetes mellitus with other skin complications: Secondary | ICD-10-CM | POA: Diagnosis present

## 2014-10-21 DIAGNOSIS — Z8249 Family history of ischemic heart disease and other diseases of the circulatory system: Secondary | ICD-10-CM

## 2014-10-21 DIAGNOSIS — D72829 Elevated white blood cell count, unspecified: Secondary | ICD-10-CM | POA: Diagnosis present

## 2014-10-21 DIAGNOSIS — I2510 Atherosclerotic heart disease of native coronary artery without angina pectoris: Secondary | ICD-10-CM | POA: Diagnosis present

## 2014-10-21 DIAGNOSIS — Z881 Allergy status to other antibiotic agents status: Secondary | ICD-10-CM

## 2014-10-21 DIAGNOSIS — N390 Urinary tract infection, site not specified: Secondary | ICD-10-CM | POA: Diagnosis present

## 2014-10-21 DIAGNOSIS — Z9981 Dependence on supplemental oxygen: Secondary | ICD-10-CM | POA: Diagnosis not present

## 2014-10-21 DIAGNOSIS — I252 Old myocardial infarction: Secondary | ICD-10-CM | POA: Diagnosis not present

## 2014-10-21 DIAGNOSIS — Z833 Family history of diabetes mellitus: Secondary | ICD-10-CM | POA: Diagnosis not present

## 2014-10-21 DIAGNOSIS — R531 Weakness: Secondary | ICD-10-CM

## 2014-10-21 DIAGNOSIS — I129 Hypertensive chronic kidney disease with stage 1 through stage 4 chronic kidney disease, or unspecified chronic kidney disease: Secondary | ICD-10-CM | POA: Diagnosis present

## 2014-10-21 DIAGNOSIS — N179 Acute kidney failure, unspecified: Secondary | ICD-10-CM | POA: Diagnosis present

## 2014-10-21 DIAGNOSIS — Y95 Nosocomial condition: Secondary | ICD-10-CM | POA: Diagnosis present

## 2014-10-21 DIAGNOSIS — I5033 Acute on chronic diastolic (congestive) heart failure: Secondary | ICD-10-CM | POA: Diagnosis not present

## 2014-10-21 DIAGNOSIS — I509 Heart failure, unspecified: Secondary | ICD-10-CM

## 2014-10-21 DIAGNOSIS — Z79899 Other long term (current) drug therapy: Secondary | ICD-10-CM

## 2014-10-21 DIAGNOSIS — E1152 Type 2 diabetes mellitus with diabetic peripheral angiopathy with gangrene: Secondary | ICD-10-CM | POA: Diagnosis present

## 2014-10-21 DIAGNOSIS — Z885 Allergy status to narcotic agent status: Secondary | ICD-10-CM | POA: Diagnosis not present

## 2014-10-21 DIAGNOSIS — L97519 Non-pressure chronic ulcer of other part of right foot with unspecified severity: Secondary | ICD-10-CM | POA: Diagnosis present

## 2014-10-21 DIAGNOSIS — L8990 Pressure ulcer of unspecified site, unspecified stage: Secondary | ICD-10-CM

## 2014-10-21 DIAGNOSIS — Z66 Do not resuscitate: Secondary | ICD-10-CM | POA: Diagnosis present

## 2014-10-21 DIAGNOSIS — S91101A Unspecified open wound of right great toe without damage to nail, initial encounter: Secondary | ICD-10-CM | POA: Diagnosis present

## 2014-10-21 DIAGNOSIS — IMO0002 Reserved for concepts with insufficient information to code with codable children: Secondary | ICD-10-CM | POA: Diagnosis present

## 2014-10-21 HISTORY — DX: Pneumonia, unspecified organism: J18.9

## 2014-10-21 HISTORY — DX: Cerebral infarction, unspecified: I63.9

## 2014-10-21 LAB — COMPREHENSIVE METABOLIC PANEL
ALK PHOS: 94 U/L (ref 38–126)
ALT: 12 U/L — AB (ref 14–54)
AST: 15 U/L (ref 15–41)
Albumin: 2.4 g/dL — ABNORMAL LOW (ref 3.5–5.0)
Anion gap: 10 (ref 5–15)
BILIRUBIN TOTAL: 0.6 mg/dL (ref 0.3–1.2)
BUN: 51 mg/dL — ABNORMAL HIGH (ref 6–20)
CALCIUM: 8.3 mg/dL — AB (ref 8.9–10.3)
CO2: 23 mmol/L (ref 22–32)
CREATININE: 1.85 mg/dL — AB (ref 0.44–1.00)
Chloride: 102 mmol/L (ref 101–111)
GFR, EST AFRICAN AMERICAN: 26 mL/min — AB (ref >60)
GFR, EST NON AFRICAN AMERICAN: 22 mL/min — AB (ref >60)
Glucose, Bld: 328 mg/dL — ABNORMAL HIGH (ref 65–99)
Potassium: 3.8 mmol/L (ref 3.5–5.1)
SODIUM: 135 mmol/L (ref 135–145)
TOTAL PROTEIN: 7 g/dL (ref 6.5–8.1)

## 2014-10-21 LAB — URINALYSIS, ROUTINE W REFLEX MICROSCOPIC
Bilirubin Urine: NEGATIVE
GLUCOSE, UA: 100 mg/dL — AB
KETONES UR: NEGATIVE mg/dL
NITRITE: NEGATIVE
Protein, ur: 100 mg/dL — AB
Specific Gravity, Urine: 1.012 (ref 1.005–1.030)
Urobilinogen, UA: 0.2 mg/dL (ref 0.0–1.0)
pH: 5.5 (ref 5.0–8.0)

## 2014-10-21 LAB — URINE MICROSCOPIC-ADD ON

## 2014-10-21 LAB — BASIC METABOLIC PANEL
ANION GAP: 12 (ref 5–15)
BUN: 59 mg/dL — ABNORMAL HIGH (ref 6–20)
CO2: 24 mmol/L (ref 22–32)
Calcium: 8.8 mg/dL — ABNORMAL LOW (ref 8.9–10.3)
Chloride: 98 mmol/L — ABNORMAL LOW (ref 101–111)
Creatinine, Ser: 2.2 mg/dL — ABNORMAL HIGH (ref 0.44–1.00)
GFR calc Af Amer: 21 mL/min — ABNORMAL LOW (ref >60)
GFR calc non Af Amer: 18 mL/min — ABNORMAL LOW (ref >60)
GLUCOSE: 361 mg/dL — AB (ref 65–99)
POTASSIUM: 4.5 mmol/L (ref 3.5–5.1)
Sodium: 134 mmol/L — ABNORMAL LOW (ref 135–145)

## 2014-10-21 LAB — CBC
HEMATOCRIT: 36.9 % (ref 36.0–46.0)
Hemoglobin: 12.1 g/dL (ref 12.0–15.0)
MCH: 27.3 pg (ref 26.0–34.0)
MCHC: 32.8 g/dL (ref 30.0–36.0)
MCV: 83.3 fL (ref 78.0–100.0)
Platelets: 338 10*3/uL (ref 150–400)
RBC: 4.43 MIL/uL (ref 3.87–5.11)
RDW: 14.6 % (ref 11.5–15.5)
WBC: 17.2 10*3/uL — AB (ref 4.0–10.5)

## 2014-10-21 LAB — GLUCOSE, CAPILLARY: Glucose-Capillary: 345 mg/dL — ABNORMAL HIGH (ref 65–99)

## 2014-10-21 LAB — MAGNESIUM: Magnesium: 1.6 mg/dL — ABNORMAL LOW (ref 1.7–2.4)

## 2014-10-21 LAB — I-STAT CG4 LACTIC ACID, ED: Lactic Acid, Venous: 1.31 mmol/L (ref 0.5–2.0)

## 2014-10-21 LAB — PHOSPHORUS: PHOSPHORUS: 3 mg/dL (ref 2.5–4.6)

## 2014-10-21 LAB — I-STAT TROPONIN, ED: Troponin i, poc: 0 ng/mL (ref 0.00–0.08)

## 2014-10-21 LAB — CBG MONITORING, ED: Glucose-Capillary: 361 mg/dL — ABNORMAL HIGH (ref 65–99)

## 2014-10-21 MED ORDER — GABAPENTIN 300 MG PO CAPS
300.0000 mg | ORAL_CAPSULE | Freq: Every day | ORAL | Status: DC
  Administered 2014-10-21 – 2014-10-24 (×4): 300 mg via ORAL
  Filled 2014-10-21 (×4): qty 1

## 2014-10-21 MED ORDER — TAMSULOSIN HCL 0.4 MG PO CAPS
0.4000 mg | ORAL_CAPSULE | Freq: Every day | ORAL | Status: DC
  Administered 2014-10-22 – 2014-10-25 (×4): 0.4 mg via ORAL
  Filled 2014-10-21 (×4): qty 1

## 2014-10-21 MED ORDER — DEXTROSE 5 % IV SOLN: 1.0000 g | Freq: Three times a day (TID) | INTRAVENOUS | Status: DC

## 2014-10-21 MED ORDER — SODIUM CHLORIDE 0.9 % IV SOLN
1500.0000 mg | Freq: Once | INTRAVENOUS | Status: AC
  Administered 2014-10-21: 1500 mg via INTRAVENOUS
  Filled 2014-10-21: qty 1500

## 2014-10-21 MED ORDER — PIPERACILLIN-TAZOBACTAM IN DEX 2-0.25 GM/50ML IV SOLN
2.2500 g | Freq: Three times a day (TID) | INTRAVENOUS | Status: DC
  Administered 2014-10-21 – 2014-10-24 (×8): 2.25 g via INTRAVENOUS
  Filled 2014-10-21 (×13): qty 50

## 2014-10-21 MED ORDER — JUVEN PO PACK
1.0000 | PACK | Freq: Two times a day (BID) | ORAL | Status: DC
  Administered 2014-10-21 – 2014-10-25 (×7): 1 via ORAL
  Filled 2014-10-21 (×10): qty 1

## 2014-10-21 MED ORDER — ISOSORBIDE MONONITRATE ER 30 MG PO TB24
30.0000 mg | ORAL_TABLET | Freq: Every day | ORAL | Status: DC
  Administered 2014-10-21 – 2014-10-24 (×4): 30 mg via ORAL
  Filled 2014-10-21 (×4): qty 1

## 2014-10-21 MED ORDER — CARVEDILOL 6.25 MG PO TABS
6.2500 mg | ORAL_TABLET | Freq: Two times a day (BID) | ORAL | Status: DC
  Administered 2014-10-22 – 2014-10-25 (×7): 6.25 mg via ORAL
  Filled 2014-10-21 (×7): qty 1

## 2014-10-21 MED ORDER — PIPERACILLIN-TAZOBACTAM 3.375 G IVPB 30 MIN
3.3750 g | Freq: Once | INTRAVENOUS | Status: AC
  Administered 2014-10-21: 3.375 g via INTRAVENOUS
  Filled 2014-10-21: qty 50

## 2014-10-21 MED ORDER — ACETAMINOPHEN 650 MG RE SUPP: 650.0000 mg | Freq: Four times a day (QID) | RECTAL | Status: DC | PRN

## 2014-10-21 MED ORDER — ASPIRIN EC 81 MG PO TBEC
81.0000 mg | DELAYED_RELEASE_TABLET | Freq: Every day | ORAL | Status: DC
  Administered 2014-10-21 – 2014-10-25 (×5): 81 mg via ORAL
  Filled 2014-10-21 (×8): qty 1

## 2014-10-21 MED ORDER — SODIUM CHLORIDE 0.9 % IV BOLUS (SEPSIS)
1000.0000 mL | Freq: Once | INTRAVENOUS | Status: AC
  Administered 2014-10-21: 1000 mL via INTRAVENOUS

## 2014-10-21 MED ORDER — ACETAMINOPHEN 325 MG PO TABS
650.0000 mg | ORAL_TABLET | Freq: Four times a day (QID) | ORAL | Status: DC | PRN
  Administered 2014-10-23 (×2): 650 mg via ORAL
  Filled 2014-10-21 (×2): qty 2

## 2014-10-21 MED ORDER — VANCOMYCIN HCL IN DEXTROSE 1-5 GM/200ML-% IV SOLN
1000.0000 mg | INTRAVENOUS | Status: DC
  Administered 2014-10-23: 1000 mg via INTRAVENOUS
  Filled 2014-10-21: qty 200

## 2014-10-21 MED ORDER — MAGNESIUM SULFATE 2 GM/50ML IV SOLN
2.0000 g | Freq: Once | INTRAVENOUS | Status: AC
  Administered 2014-10-21: 2 g via INTRAVENOUS
  Filled 2014-10-21: qty 50

## 2014-10-21 MED ORDER — SODIUM CHLORIDE 0.9 % IV SOLN
INTRAVENOUS | Status: DC
  Administered 2014-10-21: 19:00:00 via INTRAVENOUS

## 2014-10-21 MED ORDER — ONDANSETRON HCL 4 MG PO TABS: 4.0000 mg | ORAL_TABLET | Freq: Four times a day (QID) | ORAL | Status: DC | PRN

## 2014-10-21 MED ORDER — INSULIN GLARGINE 100 UNIT/ML ~~LOC~~ SOLN
40.0000 [IU] | Freq: Every morning | SUBCUTANEOUS | Status: DC
  Administered 2014-10-22 – 2014-10-25 (×4): 40 [IU] via SUBCUTANEOUS
  Filled 2014-10-21 (×4): qty 0.4

## 2014-10-21 MED ORDER — DILTIAZEM HCL ER COATED BEADS 180 MG PO CP24
180.0000 mg | ORAL_CAPSULE | Freq: Every day | ORAL | Status: DC
  Administered 2014-10-21 – 2014-10-24 (×4): 180 mg via ORAL
  Filled 2014-10-21 (×4): qty 1

## 2014-10-21 MED ORDER — LEVOTHYROXINE SODIUM 25 MCG PO TABS
175.0000 ug | ORAL_TABLET | Freq: Every day | ORAL | Status: DC
  Administered 2014-10-22 – 2014-10-25 (×4): 175 ug via ORAL
  Filled 2014-10-21 (×8): qty 1

## 2014-10-21 MED ORDER — INSULIN ASPART 100 UNIT/ML ~~LOC~~ SOLN: 0.0000 [IU] | Freq: Three times a day (TID) | SUBCUTANEOUS | Status: DC

## 2014-10-21 MED ORDER — ONDANSETRON HCL 4 MG/2ML IJ SOLN: 4.0000 mg | Freq: Four times a day (QID) | INTRAMUSCULAR | Status: DC | PRN

## 2014-10-21 MED ORDER — ADULT MULTIVITAMIN W/MINERALS CH
1.0000 | ORAL_TABLET | Freq: Every day | ORAL | Status: DC
  Administered 2014-10-22 – 2014-10-25 (×4): 1 via ORAL
  Filled 2014-10-21 (×7): qty 1

## 2014-10-21 NOTE — Progress Notes (Signed)
Spoke with Caryl Pina, RN at MD's office to clarify order for consent; according to [facility], pt should be having surgery on right foot.

## 2014-10-21 NOTE — Progress Notes (Signed)
Anesthesia Chart Review:  [name] 81 year old female scheduled for L foot wound irrigation and debridement on 10/22/2014 with Dr. Kathrynn Speed.   [name] a same day work up. [name] currently in the ER for chest pain, weakness, low O2 sats, and blood glucose 384.   PCP is Dr. Jani Gravel. Cardiologist is Dr. Shelva Majestic.   PMH includes: CAD, HTN, PVD, atrial flutter (s/p ablation), DM, chronic diastolic CHF, hypothyroidism. Former smoker. BMI 26. S/p R foot ray amputation 08/10/14 and 04/14/12.   Pt hospitalized 7/1/-08/11/14 for open wound of R great toe with osteomyelitis, s/p first ray amputation. Complicated by  DM with peripheral neuropathy, acute on chronic renal failure, hyperkalemia, dysphasia, hyponatremia, chronic diastolic heart failure.   Medications include: ASA, carvedilol, diltiazem, lasix, novolog, lantus, imdur, levothyroxine.   Labs from current ER stay 10/21/14: glucose 361. Cr 2.2, BUN 59. WBC 17.2. HgbA1c from 08/09/14 was 10.1.   Chest x-ray 10/21/14 reviewed.  1. Hypoinflation with airspace consolidation over the left lower lobe/lingula likely a pneumonia. 2. Mild stable cardiomegaly with suggestion of minimal vascular congestion. 3. Stable spinal compression fractures  EKG 10/21/14: sinus rhythm.   Echo 02/04/2012:  - Left ventricle: The cavity size was normal. Wall thickness was normal. Systolic function was normal. The estimated ejection fraction was in the range of 60% to 65%. Features are consistent with a pseudonormal left ventricular filling pattern, with concomitant abnormal relaxation and increased filling pressure (grade 2 diastolic dysfunction). - Pulmonary arteries: PA peak pressure: 75mm Hg (S).  Carotid doppler 02/04/2012: -No significant extracranial carotid artery stenosis demonstrated. Vertebrals are patent with antegrade flow.  Nuclear stress test 01/23/2009: -there is evidence of mild ischemia in the mid inferior and apical inferior regions. This is new  compared with prior study.  -the post stress EF is 75%. Global LV systolic function normal.  (**Notes from Dr. Claiborne Billings (labeled AMB correspondence in media tab dated 02/26/14) indicate pt and Dr. Claiborne Billings were concerned about renal insufficiency at time of stress test and as pt was asymptomatic, elected not to pursue intervention that would involve contrast and treat medically instead (pt was started on beta blocker)).   Willeen Cass, FNP-BC [facility]/Anesthesiology Phone: [phone] 10/21/2014 4:32 PM

## 2014-10-21 NOTE — ED Notes (Signed)
Patient transported to X-ray 

## 2014-10-21 NOTE — Progress Notes (Signed)
Report from ED received from Jerrell Mylar, RN @ 16.45. Rosalio Loud, RN

## 2014-10-21 NOTE — Progress Notes (Signed)
DESIRIE MINTEER [id number] Admission Data: 10/21/2014 7:00 PM Attending Provider: Robbie Lis, MD  PCP:KIM, Jeneen Rinks, MD Consults/ Treatment Team:    [name] is a 81 y.o. female patient admitted from ED awake, alert  & orientated  X 3,  Full Code, VSS - Blood pressure 156/66, pulse 72, temperature 97.9 F (36.6 C), temperature source Oral, resp. rate 16, height 5\' 8"  (1.727 m), weight 76.4 kg (168 lb 6.9 oz), SpO2 96 %.,no c/o shortness of breath, no c/o chest pain, no distress noted. .   IV site WDL:RFA with a transparent dsg that's clean dry and intact, transfusing  Allergies:   Allergies  Allergen Reactions  . Ephedrine     Unknown.    . Morphine Other (See Comments)    Difficulty waking up & talking after surgery  . Rocephin [Ceftriaxone Sodium In Dextrose]     Developed acute resp distress, wheezing after 2nd dose.     Past Medical History  Diagnosis Date  . Myocardial infarction   . Coronary artery disease   . Hypertension   . Dysrhythmia   . Peripheral vascular disease   . Hypothyroidism   . Diabetes mellitus   . Presbyesophagus 04/23/2011  . Urinary retention with incomplete bladder emptying 04/23/2011  . UTI (lower urinary tract infection) 04/21/2011  . Diastolic CHF, chronic     Grade I diastolic dysfunction on Echo 04/26/11  . Anaphylaxis 04/25/2011    Thought to be from Rocephin  . History of atrial flutter     ablation  . C. difficile colitis 04/17/2012  . Atrial flutter 11/01/2012    History:  obtained from ED RN Tobacco/alcohol: denied social drinker  Pt orientation to unit, room and routine. Information packet given to patient/family and safety video watched.  Admission INP armband ID verified with patient/family, and in place. SR up x 2, fall risk assessment complete with Patient and family verbalizing understanding of risks associated with falls. Pt verbalizes an understanding of how to use the call bell and to call for help before getting out of bed.  Wound to right foot and stage I on R heel.    Will cont to monitor and assist as needed.  Salley Slaughter, RN 10/21/2014 7:00 PM

## 2014-10-21 NOTE — ED Provider Notes (Signed)
CSN: [id number]     Arrival date & time 10/21/14  1218 History   First MD Initiated Contact with Patient 10/21/14 1222     Chief Complaint  Patient presents with  . Fatigue  . Chest Pain     (Consider location/radiation/quality/duration/timing/severity/associated sxs/prior Treatment) Patient is a 81 y.o. female presenting with chest pain. The history is provided by the patient.  Chest Pain Pain location:  Substernal area Pain quality: sharp   Pain radiates to:  Does not radiate Pain radiates to the back: no   Pain severity:  Moderate Onset quality:  Gradual Timing:  Constant Progression:  Unchanged Chronicity:  New Context: at rest   Relieved by:  Nothing Worsened by:  Nothing tried Associated symptoms: cough and fatigue   Associated symptoms: no abdominal pain, no fever, no shortness of breath and not vomiting     Past Medical History  Diagnosis Date  . Myocardial infarction   . Coronary artery disease   . Hypertension   . Dysrhythmia   . Peripheral vascular disease   . Hypothyroidism   . Diabetes mellitus   . Presbyesophagus 04/23/2011  . Urinary retention with incomplete bladder emptying 04/23/2011  . UTI (lower urinary tract infection) 04/21/2011  . Diastolic CHF, chronic     Grade I diastolic dysfunction on Echo 04/26/11  . Anaphylaxis 04/25/2011    Thought to be from Rocephin  . History of atrial flutter     ablation  . C. difficile colitis 04/17/2012  . Atrial flutter 11/01/2012   Past Surgical History  Procedure Laterality Date  . Cholecystectomy    . Colonoscopy  01/15/2011    Procedure: COLONOSCOPY;  Surgeon: Beryle Beams, MD;  Location: [facility];  Service: Endoscopy;  Laterality: N/A;  . Amputation Right 04/14/2012    Procedure: AMPUTATION RAY;  Surgeon: Newt Minion, MD;  Location: [facility];  Service: Orthopedics;  Laterality: Right;  ankle block  . Cardiac electrophysiology mapping and ablation    . Nm myocar perf wall motion  01/23/2009    mild  ischemia mid inferior & apical inferior.  EF 75%  . Amputation Right 08/10/2014    Procedure: AMPUTATION RAY;  Surgeon: Mcarthur Rossetti, MD;  Location: [facility];  Service: Orthopedics;  Laterality: Right;  ankle block   Family History  Problem Relation Age of Onset  . Cancer Sister   . Diabetes Mother   . Heart disease Father    Social History  Substance Use Topics  . Smoking status: Former Smoker    Types: Cigarettes    Quit date: 04/20/1981  . Smokeless tobacco: Never Used  . Alcohol Use: Yes     Comment: social   OB History    No data available     Review of Systems  Constitutional: Positive for fatigue. Negative for fever.  Respiratory: Positive for cough. Negative for shortness of breath.   Cardiovascular: Positive for chest pain.  Gastrointestinal: Negative for vomiting, abdominal pain and diarrhea.  All other systems reviewed and are negative.     Allergies  Ephedrine; Morphine; and Rocephin  Home Medications   Prior to Admission medications   Medication Sig Start Date End Date Taking? Authorizing Adalie Mand  aspirin 81 MG tablet Take 81 mg by mouth daily.    Historical Kolby Myung, MD  carvedilol (COREG) 6.25 MG tablet Take 6.25 mg by mouth 2 (two) times daily with a meal.      Historical Niylah Hassan, MD  diltiazem (CARDIZEM CD) 180 MG 24  hr capsule Take 180 mg by mouth at bedtime.     Historical Tracer Gutridge, MD  furosemide (LASIX) 20 MG tablet TAKE 1 TABLET BY MOUTH 2 TIMES DAILY. PLEASE MAKE APPOINTMENT FOR REFILLS 09/09/14   Troy Sine, MD  gabapentin (NEURONTIN) 300 MG capsule Take 300 mg by mouth 2 (two) times daily. 02/17/12   Bary Leriche, PA-C  insulin aspart (NOVOLOG) 100 UNIT/ML injection <70(0 units), 70-140 (0 units),141-180 (2 units), 181-220(4 units), 221-260 (6 units), 261-300(8 units), 301-340(10 units), >340 (12 units) notify MD    Historical Kasch Borquez, MD  insulin glargine (LANTUS) 100 UNIT/ML injection Inject 0.4 mLs (40 Units total) into the skin  every morning. 08/11/14   Venetia Maxon Rama, MD  isosorbide mononitrate (IMDUR) 30 MG 24 hr tablet Take 30 mg by mouth at bedtime.     Historical Lillieanna Tuohy, MD  levothyroxine (SYNTHROID, LEVOTHROID) 175 MCG tablet Take 175 mcg by mouth daily before breakfast.    Historical Kiyara Bouffard, MD  Multiple Vitamin (MULTIVITAMIN) tablet Take 1 tablet by mouth daily.    Historical Ian Cavey, MD  nutrition supplement, JUVEN, (JUVEN) PACK Take 1 packet by mouth 2 (two) times daily with a meal. 08/11/14   Venetia Maxon Rama, MD  Tamsulosin HCl (FLOMAX) 0.4 MG CAPS Take 0.4 mg by mouth every morning.     Historical Lucely Leard, MD  [place] Name: [facility]    Historical Sixto Bowdish, MD  VITAMIN E PO Take 1 capsule by mouth daily.    Historical Cyniah Gossard, MD   There were no vitals taken for this visit. Physical Exam  Constitutional: She is oriented to person, place, and time. She appears well-developed and well-nourished. No distress.  HENT:  Head: Normocephalic and atraumatic.  Tacky mucus membranes  Eyes: EOM are normal. Pupils are equal, round, and reactive to light.  Neck: Normal range of motion. Neck supple.  Cardiovascular: Normal rate and regular rhythm.  Exam reveals no friction rub.   No murmur heard. Pulmonary/Chest: Effort normal and breath sounds normal. No respiratory distress. She has no wheezes. She has no rales.  Abdominal: Soft. She exhibits no distension. There is no tenderness. There is no rebound.  Musculoskeletal: Normal range of motion. She exhibits no edema.  Neurological: She is alert and oriented to person, place, and time. No cranial nerve deficit. She exhibits normal muscle tone. Coordination normal.  Skin: No rash noted. She is not diaphoretic.  Nursing note and vitals reviewed.   ED Course  Procedures (including critical care time) Labs Review Labs Reviewed  CBG MONITORING, ED - Abnormal; Notable for the following:    Glucose-Capillary 361 (*)    All other components within  normal limits  CBC  BASIC METABOLIC PANEL  URINALYSIS, ROUTINE W REFLEX MICROSCOPIC (NOT AT [facility])  I-STAT TROPOININ, ED  I-STAT CG4 LACTIC ACID, ED    Imaging Review Dg Chest 2 View  10/21/2014   CLINICAL DATA:  Substernal chest pain since this morning.  EXAM: CHEST  2 VIEW  COMPARISON:  08/12/2014, 08/08/2014 and 08/27/2013  FINDINGS: Lungs are somewhat hypoinflated with airspace opacification over the left lower lobe/ lingula. No definite effusion. Minimal prominence of the perihilar markings. Mild stable cardiomegaly. There is calcified plaque over the thoracoabdominal aorta. Stable spinal compression fractures.  IMPRESSION: Hypoinflation with airspace consolidation over the left lower lobe/lingula likely a pneumonia.  Mild stable cardiomegaly with suggestion of minimal vascular congestion.  Stable spinal compression fractures for   Electronically Signed   By: Marin Olp  M.D.   On: 10/21/2014 13:58   I have personally reviewed and evaluated these images and lab results as part of my medical decision-making.   EKG Interpretation   Date/Time:  Monday October 21 2014 12:36:41 EDT Ventricular Rate:  79 PR Interval:  160 QRS Duration: 86 QT Interval:  388 QTC Calculation: 445 R Axis:   38 Text Interpretation:  Sinus rhythm No significant change since last  tracing Confirmed by Mingo Amber  MD, [place] ([id number]) on 10/21/2014 1:00:55 PM      MDM   Final diagnoses:  Healthcare-associated pneumonia  Weakness    81 year old female here with cough and chest pain. States she has been having sharp chest pain for the past few days with some fatigue. Sharp chest pain that is central. Is non-reproducible. She lives alone. Here vitals are stable. She appears dehydrated with tacky mucous membranes. No peripheral edema. We'll check a chest x-ray and labs. CXR shows pneumonia. HCAP coverage provided. Admitted.  Evelina Bucy, MD 10/21/14 [phone]

## 2014-10-21 NOTE — H&P (Signed)
[facility] History and Physical  [name] MAU:[id number] DOB: 1919-06-13 DOA: 10/21/2014  Referring physician: ER physician: Dr. Evelina Bucy PCP: Jani Gravel, MD  Chief Complaint: chest pain   HPI:  81 year old female with past medical history of hypertension, diabetes, right foot osteomyelitis s/p first ray amputation, now with left foot wound and was scheduled for left foot wound irrigation and debridement on 10/22/2014 with Dr. Kathrynn Speed. She presented to [facility] ED with substernal chest pain, non radiating, sharp, intermittent, 5/10 in intensity, at rest. No specific aggravating factors. Her pain has improved in ED. No palpitations, no shortness of breath. No fevers. No cough. No abdominal pain, nausea or vomiting. No diarrhea or constipation.   In ED, pt was hemodynamically stable. Blood work notable for WBC count 17.2, creatinine 2.20, glucose 361. CXR demonstrated airspace consolidation over the left lower lobe/lingula likely a pneumonia. UA demonstrated small leukocytes. She was started on broad spectrum antibiotics, vanco and zosyn.   Assessment & Plan    Principal Problem: Acute respiratory failure with hypoxia / HCAP (healthcare-associated pneumonia) / Leukocytosis - Pt initially hypoxic with O2 sats 87% on room air but now 92-96% with Ages oxygen support. - CXR on admission demonstrated airspace consolidation over the left lower lobe/lingula likely a pneumonia.  - Pneumonia order set placed. - Follow up strep pneumonia, legionella, blood and respiratory culture results. - Started vanco and zosyn. - Stable respiratory status.   Active Problems:  Chest pain / Coronary atherosclerosis of native coronary artery - Cycle cardiac enzymes - The first troponin was WNL - No acute changes on 12 lead EKG - Continue daily aspirin   Chronic diastolic CHF - Stable, compensated - 2 D ECHO in 01/2012 showed grade 2 diastolic dysfunction, normal EF - Lasix on hold due to worsening  renal failure  - Continue carvedilol, Cardizem, Imdur    HYPERTENSION, BENIGN ESSENTIAL - Continue Cardizem, Imdur, carvedilol - Lasix on hold due to worsening renal failure   Hypomagnesemia - Supplemented   Hypothyroidism - Continue levothyroxine  Left foot ulcer - Scheduled for L foot wound irrigation and debridement on 10/22/2014 with Dr. Kathrynn Speed. - I called ortho office to let them know pt admitted [facility]  CKD, stage 4 - Baseline Cr 1.96 in 03/2012 - Cr on this admission 2.20 likely exacerbated by lasix she takes for CHF - Lasix on hold - Follow up BMP tomorrow am   Diabetic neuropathy - Continue gabapentin  Diabetes mellitus with renal manifestations, uncontrolled - A1c in 07/2014 101 indicating poor glycemic control - Resume insulin regimen as per prior to this admission - Start SSI  UTI (urinary tract infection) - UA on admission showed small leukocytes and few bacteria; rare yeast also seen but will await final urine culture results    DVT prophylaxis:  - SCD's bilaterally     Radiological Exams on Admission: Dg Chest 2 View 10/21/2014  Hypoinflation with airspace consolidation over the left lower lobe/lingula likely a pneumonia.  Mild stable cardiomegaly with suggestion of minimal vascular congestion.  Stable spinal compression fractures.   EKG: I have personally reviewed EKG. EKG shows sinus rhythm   Code Status: Full Family Communication: Plan of care discussed with the patient and her daughter at the bedside  Disposition Plan: Admit for further evaluation, telemetry    DEVINE, Dedra Skeens, MD  [facility] Pager [phone]   Time spent in minutes: 75 minutes  Review of Systems:  Constitutional: Negative for fever, chills and malaise/fatigue.  Negative for diaphoresis.  HENT: Negative for hearing loss, ear pain, nosebleeds, congestion, sore throat, neck pain, tinnitus and ear discharge.   Eyes: Negative for blurred vision, double vision, photophobia,  pain, discharge and redness.  Respiratory: Negative for cough, hemoptysis, sputum production, shortness of breath, wheezing and stridor.   Cardiovascular: per HPI Gastrointestinal: Negative for nausea, vomiting and abdominal pain. Negative for heartburn, constipation, blood in stool and melena.  Genitourinary: Negative for dysuria, urgency, frequency, hematuria and flank pain.  Musculoskeletal: Negative for myalgias, back pain, joint pain and falls.  Skin: Negative for itching and rash.  Neurological: Negative for dizziness and weakness. Negative for tingling, tremors, sensory change, speech change, focal weakness, loss of consciousness and headaches.  Endo/Heme/Allergies: Negative for environmental allergies and polydipsia. Does not bruise/bleed easily.  Psychiatric/Behavioral: Negative for suicidal ideas. The patient is not nervous/anxious.      Past Medical History  Diagnosis Date  . Myocardial infarction   . Coronary artery disease   . Hypertension   . Dysrhythmia   . Peripheral vascular disease   . Hypothyroidism   . Diabetes mellitus   . Presbyesophagus 04/23/2011  . Urinary retention with incomplete bladder emptying 04/23/2011  . UTI (lower urinary tract infection) 04/21/2011  . Diastolic CHF, chronic     Grade I diastolic dysfunction on Echo 04/26/11  . Anaphylaxis 04/25/2011    Thought to be from Rocephin  . History of atrial flutter     ablation  . C. difficile colitis 04/17/2012  . Atrial flutter 11/01/2012   Past Surgical History  Procedure Laterality Date  . Cholecystectomy    . Colonoscopy  01/15/2011    Procedure: COLONOSCOPY;  Surgeon: Beryle Beams, MD;  Location: [facility];  Service: Endoscopy;  Laterality: N/A;  . Amputation Right 04/14/2012    Procedure: AMPUTATION RAY;  Surgeon: Newt Minion, MD;  Location: [facility];  Service: Orthopedics;  Laterality: Right;  ankle block  . Cardiac electrophysiology mapping and ablation    . Nm myocar perf wall motion   01/23/2009    mild ischemia mid inferior & apical inferior.  EF 75%  . Amputation Right 08/10/2014    Procedure: AMPUTATION RAY;  Surgeon: Mcarthur Rossetti, MD;  Location: [facility];  Service: Orthopedics;  Laterality: Right;  ankle block   Social History:  reports that she quit smoking about 33 years ago. Her smoking use included Cigarettes. She has never used smokeless tobacco. She reports that she drinks alcohol. She reports that she does not use illicit drugs.  Allergies  Allergen Reactions  . Ephedrine     Unknown.    . Morphine Other (See Comments)    Difficulty waking up & talking after surgery  . Rocephin [Ceftriaxone Sodium In Dextrose]     Developed acute resp distress, wheezing after 2nd dose.    Family History:  Family History  Problem Relation Age of Onset  . Cancer Sister   . Diabetes Mother   . Heart disease Father      Prior to Admission medications   Medication Sig Start Date End Date Taking? Authorizing Provider  aspirin 81 MG tablet Take 81 mg by mouth daily.   Yes Historical Provider, MD  carvedilol (COREG) 6.25 MG tablet Take 6.25 mg by mouth 2 (two) times daily with a meal.     Yes Historical Provider, MD  furosemide (LASIX) 20 MG tablet TAKE 1 TABLET BY MOUTH 2 TIMES  09/09/14  Yes Troy Sine, MD  gabapentin (NEURONTIN)  300 MG capsule Take 300 mg by mouth 2 (two) times daily. 02/17/12  Yes Ivan Anchors Love, PA-C  insulin aspart (NOVOLOG) 100 UNIT/ML injection <70(0 units), 70-140 (0 units),141-180 (2 units), 181-220(4 units), 221-260 (6 units), 261-300(8 units), 301-340(10 units), >340 (12 units) notify MD   Yes Historical Provider, MD  insulin glargine (LANTUS) 100 UNIT/ML injection Inject 0.4 mLs (40 Units total) into the skin every morning. 08/11/14  Yes Christina P Rama, MD  levothyroxine (SYNTHROID, LEVOTHROID) 175 MCG tablet Take 175 mcg by mouth daily before breakfast.   Yes Historical Provider, MD  Multiple Vitamin (MULTIVITAMIN) tablet Take 1 tablet  by mouth daily.   Yes Historical Provider, MD  nutrition supplement, JUVEN, (JUVEN) PACK Take 1 packet by mouth 2 (two) times daily with a meal. 08/11/14  Yes Venetia Maxon Rama, MD  Tamsulosin HCl (FLOMAX) 0.4 MG CAPS Take 0.4 mg by mouth every morning.    Yes Historical Provider, MD  VITAMIN E PO Take 1 capsule by mouth daily.   Yes Historical Provider, MD  diltiazem (CARDIZEM CD) 180 MG 24 hr capsule Take 180 mg by mouth at bedtime.     Historical Provider, MD  isosorbide mononitrate (IMDUR) 30 MG 24 hr tablet Take 30 mg by mouth at bedtime.     Historical Provider, MD   Physical Exam: Filed Vitals:   10/21/14 1615 10/21/14 1714 10/21/14 1745 10/21/14 1856  BP: 133/50 161/63  156/66  Pulse: 71 73  72  Temp:  97.9 F (36.6 C)    TempSrc:  Oral    Resp:  16    Height:   5\' 8"  (1.727 m)   Weight:   76.4 kg (168 lb 6.9 oz)   SpO2: 92% 96%  96%    Physical Exam  Constitutional: Appears well-developed and well-nourished. No distress.  HENT: Normocephalic. No tonsillar erythema or exudates Eyes: Conjunctivae are normal. No scleral icterus.  Neck: Normal ROM. Neck supple. No JVD. No tracheal deviation. No thyromegaly.  CVS: RRR, S1/S2 appreciated  Pulmonary: Effort and breath sounds normal, no stridor, rhonchi, wheezes, rales.  Abdominal: Soft. BS +,  no distension, tenderness, rebound or guarding.  Musculoskeletal: Normal range of motion. Right LE wrapped.   Lymphadenopathy: No lymphadenopathy noted, cervical, inguinal. Neuro: Alert. Normal reflexes, muscle tone coordination. No focal neurologic deficits. Skin: Skin is warm and dry. No rash noted.  No erythema. No pallor.  Psychiatric: Normal mood and affect. Behavior, judgment, thought content normal.   Labs on Admission:  Basic Metabolic Panel:  Recent Labs Lab 10/21/14 1300 10/21/14 1826  NA 134* 135  K 4.5 3.8  CL 98* 102  CO2 24 23  GLUCOSE 361* 328*  BUN 59* 51*  CREATININE 2.20* 1.85*  CALCIUM 8.8* 8.3*  MG  --  1.6*   PHOS  --  3.0   Liver Function Tests:  Recent Labs Lab 10/21/14 1826  AST 15  ALT 12*  ALKPHOS 94  BILITOT 0.6  PROT 7.0  ALBUMIN 2.4*   No results for input(s): LIPASE, AMYLASE in the last 168 hours. No results for input(s): AMMONIA in the last 168 hours. CBC:  Recent Labs Lab 10/21/14 1300  WBC 17.2*  HGB 12.1  HCT 36.9  MCV 83.3  PLT 338   Cardiac Enzymes: No results for input(s): CKTOTAL, CKMB, CKMBINDEX, TROPONINI in the last 168 hours. BNP: Invalid input(s): POCBNP CBG:  Recent Labs Lab 10/21/14 1231  GLUCAP 361*    If 7PM-7AM, please contact night-coverage [email or web address] Password  [email or web address] 10/21/2014, 8:43 PM

## 2014-10-21 NOTE — Progress Notes (Signed)
ANTIBIOTIC CONSULT NOTE - INITIAL  Pharmacy Consult for Vancomycin Indication: pneumonia  Allergies  Allergen Reactions  . Ephedrine     Unknown.    . Morphine Other (See Comments)    Difficulty waking up & talking after surgery  . Rocephin [Ceftriaxone Sodium In Dextrose]     Developed acute resp distress, wheezing after 2nd dose.    Patient Measurements: Height: 5\' 8"  (172.7 cm) Weight: 168 lb 6.9 oz (76.4 kg) (transcribed from 08/29/14 visit) IBW/kg (Calculated) : 63.9  Vital Signs: Temp: 97.9 F (36.6 C) (09/26 1714) Temp Source: Oral (09/26 1714) BP: 161/63 mmHg (09/26 1714) Pulse Rate: 73 (09/26 1714) Intake/Output from previous day:   Intake/Output from this shift: Total I/O In: -  Out: 225 [Urine:225]  Labs:  Recent Labs  10/21/14 1300  WBC 17.2*  HGB 12.1  PLT 338  CREATININE 2.20*   Estimated Creatinine Clearance: 16.1 mL/min (by C-G formula based on Cr of 2.2). No results for input(s): VANCOTROUGH, VANCOPEAK, VANCORANDOM, GENTTROUGH, GENTPEAK, GENTRANDOM, TOBRATROUGH, TOBRAPEAK, TOBRARND, AMIKACINPEAK, AMIKACINTROU, AMIKACIN in the last 72 hours.   Microbiology: Recent Results (from the past 720 hour(s))  Blood culture (routine x 2)     Status: None (Preliminary result)   Collection Time: 10/21/14  1:00 PM  Result Value Ref Range Status   Specimen Description BLOOD RIGHT ANTECUBITAL  Final   Special Requests BOTTLES DRAWN AEROBIC AND ANAEROBIC 5CC  Final   Culture PENDING  Incomplete   Report Status PENDING  Incomplete  Blood culture (routine x 2)     Status: None (Preliminary result)   Collection Time: 10/21/14  2:57 PM  Result Value Ref Range Status   Specimen Description BLOOD RIGHT FOREARM  Final   Special Requests BOTTLES DRAWN AEROBIC AND ANAEROBIC 10ML  Final   Culture PENDING  Incomplete   Report Status PENDING  Incomplete    Medical History: Past Medical History  Diagnosis Date  . Myocardial infarction   . Coronary artery disease    . Hypertension   . Dysrhythmia   . Peripheral vascular disease   . Hypothyroidism   . Diabetes mellitus   . Presbyesophagus 04/23/2011  . Urinary retention with incomplete bladder emptying 04/23/2011  . UTI (lower urinary tract infection) 04/21/2011  . Diastolic CHF, chronic     Grade I diastolic dysfunction on Echo 04/26/11  . Anaphylaxis 04/25/2011    Thought to be from Rocephin  . History of atrial flutter     ablation  . C. difficile colitis 04/17/2012  . Atrial flutter 11/01/2012    Medications:  Prescriptions prior to admission  Medication Sig Dispense Refill Last Dose  . aspirin 81 MG tablet Take 81 mg by mouth daily.   10/20/2014 at Unknown time  . carvedilol (COREG) 6.25 MG tablet Take 6.25 mg by mouth 2 (two) times daily with a meal.     10/20/2014 at 0930  . furosemide (LASIX) 20 MG tablet TAKE 1 TABLET BY MOUTH 2 TIMES DAILY. 180 tablet 3 10/20/2014 at Unknown time  . gabapentin (NEURONTIN) 300 MG capsule Take 300 mg by mouth 2 (two) times daily.   10/20/2014 at Unknown time  . insulin aspart (NOVOLOG) 100 UNIT/ML injection <70(0 units), 70-140 (0 units),141-180 (2 units), 181-220(4 units), 221-260 (6 units), 261-300(8 units), 301-340(10 units), >340 (12 units) notify MD   10/20/2014 at Unknown time  . insulin glargine (LANTUS) 100 UNIT/ML injection Inject 0.4 mLs (40 Units total) into the skin every morning. 10 mL 11 10/20/2014 at  Unknown time  . levothyroxine (SYNTHROID, LEVOTHROID) 175 MCG tablet Take 175 mcg by mouth daily before breakfast.   10/20/2014 at Unknown time  . Multiple Vitamin (MULTIVITAMIN) tablet Take 1 tablet by mouth daily.   10/20/2014 at Unknown time  . mupirocin ointment (BACTROBAN) 2 % Apply 1 application topically 2 (two) times daily as needed.  0 Past Week at Unknown time  . nutrition supplement, JUVEN, (JUVEN) PACK Take 1 packet by mouth 2 (two) times daily with a meal.  0 Past Month at Unknown time  . Tamsulosin HCl (FLOMAX) 0.4 MG CAPS Take 0.4 mg by mouth  every morning.    10/20/2014 at Unknown time  . VITAMIN E PO Take 1 capsule by mouth daily.   10/20/2014 at Unknown time  . diltiazem (CARDIZEM CD) 180 MG 24 hr capsule Take 180 mg by mouth at bedtime.    10/19/2014  . isosorbide mononitrate (IMDUR) 30 MG 24 hr tablet Take 30 mg by mouth at bedtime.    10/19/2014  . UNABLE TO FIND Med Name: Med Pass   Taking   Assessment: 81 yo F brought in by EMS 10/21/2014 with complaints of chest pain and SOB. CXR in ED consistent with PNA.  Pt ordered empiric Vancomycin and Ceftaz. Pt reports an allergy to Rocephin = SOB/respiratory distress.  MD paged to change to Zosyn (tolerated dose in ED).  Pt also received Vancomycin 1500 mg IV x 1 in ED at 1600.  Pt has some baseline renal insufficiency and antibiotic doses will be adjusted accordingly.  Of note, pt was scheduled for OR 10/22/14 for I&D of L foot wound.  PMH: CAD, HTN, PVD, atrial flutter (s/p ablation), DM, chronic diastolic CHF, hypothyroidism. Former smoker. S/p R foot ray amputation 08/10/14 and 04/14/12.   Goal of Therapy:  Vancomycin trough level 15-20 mcg/ml  Plan:  Vancomycin 1gm IV q48h - next dose due 9/28 at 1600.  [employer], Pharm.D., BCPS Clinical Pharmacist Pager [phone] 10/21/2014 6:11 PM

## 2014-10-21 NOTE — Progress Notes (Signed)
Called at 16:25 to get report from ED, Delon [place], RN. Was transferred RN didn't answer.  Rosalio Loud, RN

## 2014-10-21 NOTE — ED Notes (Signed)
Patient returned from X-ray 

## 2014-10-21 NOTE — ED Notes (Addendum)
Per EMS - pt not feeling well/feeling weak x 2 weeks. Awakened at 2am w/ CP. Low O2 sat for EMS - 87% on 2-4L [place]. Pt now 97% on 2L. Pt insulin dependent diabetic, supposed to have surgery on infected toe tomorrow. CBG 384.  Given 324mg  aspirin and 1 nitro with some relief (BP dropped so no more given)

## 2014-10-22 ENCOUNTER — Ambulatory Visit (HOSPITAL_COMMUNITY): Admission: RE | Admit: 2014-10-22 | Payer: PPO | Source: Ambulatory Visit | Admitting: Orthopaedic Surgery

## 2014-10-22 ENCOUNTER — Encounter (HOSPITAL_COMMUNITY): Admission: RE | Payer: Self-pay | Source: Ambulatory Visit

## 2014-10-22 LAB — TROPONIN I
TROPONIN I: 0.04 ng/mL — AB (ref <0.031)
Troponin I: 0.09 ng/mL — ABNORMAL HIGH (ref <0.031)
Troponin I: 0.03 ng/mL (ref <0.031)

## 2014-10-22 LAB — BASIC METABOLIC PANEL
ANION GAP: 10 (ref 5–15)
BUN: 50 mg/dL — ABNORMAL HIGH (ref 6–20)
CALCIUM: 8 mg/dL — AB (ref 8.9–10.3)
CO2: 23 mmol/L (ref 22–32)
CREATININE: 1.81 mg/dL — AB (ref 0.44–1.00)
Chloride: 100 mmol/L — ABNORMAL LOW (ref 101–111)
GFR, EST AFRICAN AMERICAN: 27 mL/min — AB (ref >60)
GFR, EST NON AFRICAN AMERICAN: 23 mL/min — AB (ref >60)
GLUCOSE: 422 mg/dL — AB (ref 65–99)
Potassium: 4.3 mmol/L (ref 3.5–5.1)
Sodium: 133 mmol/L — ABNORMAL LOW (ref 135–145)

## 2014-10-22 LAB — GLUCOSE, CAPILLARY
GLUCOSE-CAPILLARY: 250 mg/dL — AB (ref 65–99)
Glucose-Capillary: 407 mg/dL — ABNORMAL HIGH (ref 65–99)
Glucose-Capillary: 403 mg/dL — ABNORMAL HIGH (ref 65–99)
Glucose-Capillary: 160 mg/dL — ABNORMAL HIGH (ref 65–99)

## 2014-10-22 LAB — CBC
HCT: 32.7 % — ABNORMAL LOW (ref 36.0–46.0)
Hemoglobin: 10.5 g/dL — ABNORMAL LOW (ref 12.0–15.0)
MCH: 26.8 pg (ref 26.0–34.0)
MCHC: 32.1 g/dL (ref 30.0–36.0)
MCV: 83.4 fL (ref 78.0–100.0)
PLATELETS: 308 10*3/uL (ref 150–400)
RBC: 3.92 MIL/uL (ref 3.87–5.11)
RDW: 14.8 % (ref 11.5–15.5)
WBC: 15.1 10*3/uL — ABNORMAL HIGH (ref 4.0–10.5)

## 2014-10-22 SURGERY — IRRIGATION AND DEBRIDEMENT EXTREMITY
Anesthesia: Choice | Site: Foot | Laterality: Right

## 2014-10-22 MED ORDER — GLUCERNA SHAKE PO LIQD
237.0000 mL | Freq: Two times a day (BID) | ORAL | Status: DC
  Administered 2014-10-22 – 2014-10-25 (×6): 237 mL via ORAL

## 2014-10-22 MED ORDER — INSULIN ASPART 100 UNIT/ML ~~LOC~~ SOLN
0.0000 [IU] | Freq: Three times a day (TID) | SUBCUTANEOUS | Status: DC
  Administered 2014-10-22 (×2): 15 [IU] via SUBCUTANEOUS
  Administered 2014-10-23: 2 [IU] via SUBCUTANEOUS
  Administered 2014-10-23: 11 [IU] via SUBCUTANEOUS
  Administered 2014-10-23: 3 [IU] via SUBCUTANEOUS
  Administered 2014-10-24: 8 [IU] via SUBCUTANEOUS
  Administered 2014-10-24: 2 [IU] via SUBCUTANEOUS
  Administered 2014-10-24: 11 [IU] via SUBCUTANEOUS
  Administered 2014-10-25: 5 [IU] via SUBCUTANEOUS
  Administered 2014-10-25: 2 [IU] via SUBCUTANEOUS

## 2014-10-22 MED ORDER — HYDROMORPHONE HCL 1 MG/ML IJ SOLN
INTRAMUSCULAR | Status: AC
  Administered 2014-10-22: 0.5 mg via INTRAVENOUS
  Filled 2014-10-22: qty 1

## 2014-10-22 MED ORDER — SODIUM CHLORIDE 0.9 % IV SOLN
INTRAVENOUS | Status: DC
Start: 2014-10-22 — End: 2014-10-23
  Administered 2014-10-22: 1 mL via INTRAVENOUS
  Administered 2014-10-23: 03:00:00 via INTRAVENOUS

## 2014-10-22 MED ORDER — HEPARIN SODIUM (PORCINE) 5000 UNIT/ML IJ SOLN
5000.0000 [IU] | Freq: Three times a day (TID) | INTRAMUSCULAR | Status: DC
  Administered 2014-10-22 – 2014-10-25 (×10): 5000 [IU] via SUBCUTANEOUS
  Filled 2014-10-22 (×9): qty 1

## 2014-10-22 MED ORDER — INSULIN ASPART 100 UNIT/ML ~~LOC~~ SOLN
0.0000 [IU] | Freq: Every day | SUBCUTANEOUS | Status: DC
  Administered 2014-10-23: 2 [IU] via SUBCUTANEOUS

## 2014-10-22 MED ORDER — INSULIN ASPART 100 UNIT/ML ~~LOC~~ SOLN
3.0000 [IU] | Freq: Three times a day (TID) | SUBCUTANEOUS | Status: DC
  Administered 2014-10-22 – 2014-10-25 (×7): 3 [IU] via SUBCUTANEOUS

## 2014-10-22 MED ORDER — HYDROMORPHONE HCL 1 MG/ML IJ SOLN
0.5000 mg | Freq: Once | INTRAMUSCULAR | Status: AC
  Administered 2014-10-22: 0.5 mg via INTRAVENOUS

## 2014-10-22 NOTE — Progress Notes (Signed)
Initial Nutrition Assessment  DOCUMENTATION CODES:   Not applicable  INTERVENTION:   -Continue MVI -Continue Juven BID -Glucerna Shake po BID, each supplement provides 220 kcal and 10 grams of protein  NUTRITION DIAGNOSIS:   Increased nutrient needs related to wound healing as evidenced by estimated needs.  GOAL:   Patient will meet greater than or equal to 90% of their needs  MONITOR:   PO intake, Supplement acceptance, Labs, Weight trends, Skin, I & O's  REASON FOR ASSESSMENT:   Low Braden    ASSESSMENT:   81 year old female with past medical history of hypertension, diabetes, right foot osteomyelitis s/p first ray amputation, now with left foot wound and was scheduled for left foot wound irrigation and debridement on 10/22/2014 with Dr. Kathrynn Speed. She presented to [facility] ED with substernal chest pain, non radiating, sharp, intermittent, 5/10 in intensity, at rest. No specific aggravating factors. Her pain has improved in ED. No palpitations, no shortness of breath. No fevers. No cough. No abdominal pain, nausea or vomiting. No diarrhea or constipation.   Pt admitted with acute respiratory failure with hypoxia, HCAP, and leukocytosis.   Pt was somnolent at time of visit and unable to provide hx. Nutrition-Focused physical exam completed. Findings are mild fat depletion, mild muscle depletion, and no edema. Suspect wasting is related to advanced age. Wt has been stable over the past year.   Noted 35% meal completion. Pt has MVI and Juven already ordered. Will also add Glucerna Shake.   Pt with DM ulcer on rt heel; pt was scheduled for outpatient I&D prior to hospitalization.  Labs reviewed: Na: 133 (on IV supplementation), Mg: 1.6.   Diet Order:  Diet Carb Modified Fluid consistency:: Thin; Room service appropriate?: Yes  Skin:  Wound (see comment) (stage I and DM ulcer on rt heel)  Last BM:  10/20/14  Height:   Ht Readings from Last 1 Encounters:  10/21/14 5\' 8"   (1.727 m)    Weight:   Wt Readings from Last 1 Encounters:  10/22/14 168 lb 6.9 oz (76.4 kg)    Ideal Body Weight:  63.6 kg  BMI:  Body mass index is 25.62 kg/(m^2).  Estimated Nutritional Needs:   Kcal:  1700-1900  Protein:  80-95 grams  Fluid:  1.7-1.9 L  EDUCATION NEEDS:   No education needs identified at this time  Jenifer A. Jimmye Norman, RD, LDN, CDE Pager: [phone] After hours Pager: [phone]

## 2014-10-22 NOTE — Evaluation (Signed)
Physical Therapy Evaluation Patient Details Name: [name] MRN: [id number] DOB: [birth year] Today's Date: 10/22/2014   History of Present Illness  [name] a 81 y/o female with a PMH of HTN, DM, R foot osteomyelitis s/p 1st ray amputation, now with L foot wound and was scheduled for I&D on 10/22/14. She presented to the [facility] with substernal CP, non radiating, sharp, intermittent, 5/10 in intensity at rest.   Clinical Impression  Pt admitted with above diagnosis. Pt currently with functional limitations due to the deficits listed below (see PT Problem List). At the time of PT eval pt was initially on RA. She became wheezy and SOB during history interview and sats were checked. O2 sats at 79%. Pt was placed on 2L/min supplemental O2 per RN instruction and sats increased to 82%. Increased to 3L/min per RN. When PT exited room sats were at 90% with RN present. Pt will benefit from skilled PT to increase their independence and safety with mobility to allow discharge to the venue listed below.       Follow Up Recommendations SNF;Supervision/Assistance - 24 hour    Equipment Recommendations  None recommended by PT    Recommendations for Other Services       Precautions / Restrictions Precautions Precautions: Fall Precaution Comments: Watch O2 sats Restrictions Weight Bearing Restrictions: No      Mobility  Bed Mobility                  Transfers                    Ambulation/Gait                Stairs            Wheelchair Mobility    Modified Rankin (Stroke Patients Only)       Balance                                             Pertinent Vitals/Pain Pain Assessment: No/denies pain    Home Living Family/patient expects to be discharged to:: Skilled nursing facility Living Arrangements: Alone               Additional Comments: States she is alone because son died and he was helping her a lot. Unsure how much the  daughter-in-law is assisting now. [name] a poor historian.     Prior Function Level of Independence: Needs assistance   Gait / Transfers Assistance Needed: Transfers to/from wheelchair, toilet  ADL's / Homemaking Assistance Needed: aide 4-5 hours/day per previous admission.        Hand Dominance        Extremity/Trunk Assessment   Upper Extremity Assessment: Defer to OT evaluation           Lower Extremity Assessment: Generalized weakness         Communication   Communication: No difficulties  Cognition Arousal/Alertness: Lethargic Behavior During Therapy: Flat affect Overall Cognitive Status: No family/caregiver present to determine baseline cognitive functioning       Memory: Decreased short-term memory              General Comments General comments (skin integrity, edema, etc.): Bed-level eval attempted. Pt not following commands consistently, so MMT of the LE's was limited. Appears quads have fairly good strength for age, from supine assessment.  Exercises        Assessment/Plan    PT Assessment Patient needs continued PT services  PT Diagnosis Difficulty walking;Generalized weakness   PT Problem List Decreased strength;Decreased range of motion;Decreased activity tolerance;Decreased balance;Decreased mobility;Decreased knowledge of use of DME;Decreased safety awareness;Decreased knowledge of precautions;Cardiopulmonary status limiting activity  PT Treatment Interventions DME instruction;Gait training;Stair training;Functional mobility training;Therapeutic activities;Therapeutic exercise;Neuromuscular re-education;Patient/family education;Cognitive remediation   PT Goals (Current goals can be found in the Care Plan section) Acute Rehab PT Goals Patient Stated Goal: Pt did not state goals at this time PT Goal Formulation: Patient unable to participate in goal setting Time For Goal Achievement: 11/05/14 Potential to Achieve Goals: Fair     Frequency Min 3X/week   Barriers to discharge Decreased caregiver support Does not appear pt has 24 hour support    Co-evaluation               End of Session Equipment Utilized During Treatment: Oxygen Activity Tolerance: Patient limited by lethargy Patient left: in bed;with call bell/phone within reach;with bed alarm set;with nursing/sitter in room Nurse Communication: Mobility status;Precautions (O2 status)         Time: 6213-0865 PT Time Calculation (min) (ACUTE ONLY): 18 min   Charges:   PT Evaluation $Initial PT Evaluation Tier I: 1 Procedure     PT G CodesRolinda Roan 11-02-2014, 11:40 AM   Rolinda Roan, PT, DPT Acute Rehabilitation Services Pager: [phone]

## 2014-10-22 NOTE — Consult Note (Signed)
Consult for goals of care with patient and daughter [name]).  [name] is a 81 year old patient who has been living in her own home, wheelchair bound but able to transfer to shower, bed and toilet without assistance.  She has assistance about 3 hours a day with meals are prepared and left for heating.  She has managed her won medication up to this point including IDDM.  Beverly visits a few times a week and has seen a slight decline in her mothers memory.  [name] has a significant cardiac history IDDM, IB symptoms (she has suffered since youth, no official dx).    Her recent significant history includes an ulcer on her right foot that has been treated on and off for the past 2 years at the wound clinic.  Bedside debridement done today by Dr. Ninfa Linden.  (see note)  Goals conversation included discussion of desired treatments and care planning for discharge.  [name] denies pain and is oriented.  She is very weak and is visible upset over her significant decline over the past day.  [name] reports that her mother is typically happy and loves caring for her 2 cats.  When asked [name] is tearful and fears that she will not see them again.  Emotional support provided.  [name] and her daughter agree that treatment for acute HCAP is desired as well as care for the right foot wound. Code status is DNR therefore if she suffered cardiac or respiratory arrest she does not desire to be placed on life support.  Otherwise she agrees to continue current treatment and possible evaluation for SNF at discharge.  We discussed the possibility of her not returning to her baseline ability to live independantly.  [name] has already begun to investigate assisted living for the future.  Ms [name] does have  Living Will/HCPOA paperwork, [name] is to bring a copy for her chart.  Outcome of goals discussion:  Continue current medical treatment of PNA, DNR, transition as appropriate to d/c at SNF, [facility] is desired  (several previous admissions)    Conversations will continue as hospital course proceeds.  No significant symptom management needs at this time.  Will follow up tomorrow  [name], RN, MSN, [place]

## 2014-10-22 NOTE — Consult Note (Signed)
WOC consulted for right foot wound, after review of the chart patient was scheduled with Dr. Ninfa Linden for irrigation and debridement of this foot wound.  Per H&P Dr. Ninfa Linden has been consulted, for this reason WOC will not consult.  Melody Hildebran RN,CWOCN [phone]

## 2014-10-22 NOTE — Discharge Instructions (Signed)
Keep you right foot dressing clean and dry.

## 2014-10-22 NOTE — Progress Notes (Signed)
Patient ID: [name], female   DOB: August 12, 1919, 81 y.o.   MRN: [id number] Due to her peripheral neuropathy, I was able to easily perform a bedside debridement of her right foot wound.  She had a previous right 1st ray resection and had developed further breakdown of her incision and developed further soft-tissue necrosis.  I was able to clean her right foot with betadine and then use a #15 blade as well as a rounger to sharply debride necrotic skin, fascia, and bone.  I then thoroughly irrigated the wound with normal saline and closed the incision with interrupted 2-0 nylon suture.  Xeroform and well-padded dressing were applied.  She tolerated this very well and her daughter was present during the entire procedure.

## 2014-10-22 NOTE — Progress Notes (Signed)
Patient Demographics:    [name], is a 81 y.o. female, DOB - 1919-08-11, WJX:[id number]  Admit date - 10/21/2014   Admitting Physician Robbie Lis, MD  Outpatient Primary MD for the patient is Jani Gravel, MD  LOS - 1   Chief Complaint  Patient presents with  . Fatigue  . Chest Pain        Subjective:    [name] today has, No headache, No chest pain, No abdominal pain - No Nausea, No new weakness tingling or numbness, No Cough - SOB. Some right foot discomfort   Assessment  & Plan :     1. Acute hypoxic respiratory failure due to HCAP - and in empiric antibiotic, monitor cultures, supportive care with oxygen nebulizer treatment as needed. Leukocytosis improving clinically better. Breathing improved. Titrate off oxygen as possible.  2. Chronic right foot ulcer and wound with some surrounding cellulitis. Dr. Ninfa Linden orthopedics on board, planning for bedside debridement per daughter, antibiotic for #1 above should suffice. We'll require wound care here and after discharge.  3. Chest pain upon admission. Currently chest pain-free, EKG was nonacute, troponin mildly elevated with cycle 2 document trend for prognostic purposes, she is a candidate for medical treatment only. On aspirin, beta blocker and Imdur which will be continued. Due to advanced age. We'll candidate for statin initiation. Will not repeat echo as it would not change management. Not a candidate for invasive procedures or testing.  4. Chronic diastolic CHF. EF was about 50% in 2014. Mildly dehydrated, hydrate and monitor.   5. Essential hypertension she is currently on a combination of Coreg, Imdur and Cardizem which will be continued.   6. ARF on Chronic kidney disease stage IV. Baseline creatinine appears to be close to  1.6. Mildly dehydrated, Gently hydrate and monitor.   7. Hypothyroidism. On Synthroid continue.   8. UTI upon admission. On empiric antibiotic monitor cultures.   9. DM type II. Poor control on Lantus, pre-meal NovoLog and sliding scale. We'll monitor CBGs.  Lab Results  Component Value Date   HGBA1C 10.1* 08/09/2014    CBG (last 3)   Recent Labs  10/21/14 1231 10/21/14 2131 10/22/14 0847  GLUCAP 361* 345* 407*       Code Status : DO NOT RESUSCITATE  Family Communication  : Daughter, gentle medical treatment of declines comfort care. DO NOT RESUSCITATE  Disposition Plan  : likely SNF  Consults  :  Ortho [facility]  Procedures  :    DVT Prophylaxis  :    Heparin   Lab Results  Component Value Date   PLT 308 10/22/2014    Inpatient Medications  Scheduled Meds: . aspirin EC  81 mg Oral Daily  . carvedilol  6.25 mg Oral BID WC  . diltiazem  180 mg Oral QHS  . gabapentin  300 mg Oral QHS  . insulin aspart  0-15 Units Subcutaneous TID WC  . insulin aspart  0-5 Units Subcutaneous QHS  . insulin glargine  40 Units Subcutaneous q morning - 10a  . isosorbide mononitrate  30 mg Oral QHS  . levothyroxine  175 mcg Oral QAC breakfast  . multivitamin with minerals  1 tablet Oral Daily  . nutrition supplement (JUVEN)  1  packet Oral BID WC  . piperacillin-tazobactam (ZOSYN)  IV  2.25 g Intravenous 3 times per day  . tamsulosin  0.4 mg Oral QPC breakfast  . [START ON 10/23/2014] vancomycin  1,000 mg Intravenous Q48H   Continuous Infusions: . sodium chloride 50 mL/hr at 10/21/14 1846   PRN Meds:.acetaminophen **OR** acetaminophen, ondansetron **OR** ondansetron (ZOFRAN) IV  Antibiotics  :     Anti-infectives    Start     Dose/Rate Route Frequency Ordered Stop   10/23/14 1600  vancomycin (VANCOCIN) IVPB 1000 mg/200 mL premix     1,000 mg 200 mL/hr over 60 Minutes Intravenous Every 48 hours 10/21/14 1812     10/21/14 2200  piperacillin-tazobactam (ZOSYN) IVPB 2.25  g     2.25 g 100 mL/hr over 30 Minutes Intravenous 3 times per day 10/21/14 1826     10/21/14 1745  cefTAZidime (FORTAZ) 1 g in dextrose 5 % 50 mL IVPB  Status:  Discontinued     1 g 100 mL/hr over 30 Minutes Intravenous 3 times per day 10/21/14 1733 10/21/14 1826   10/21/14 1500  vancomycin (VANCOCIN) 1,500 mg in sodium chloride 0.9 % 500 mL IVPB     1,500 mg 250 mL/hr over 120 Minutes Intravenous  Once 10/21/14 1425 10/21/14 1800   10/21/14 1430  piperacillin-tazobactam (ZOSYN) IVPB 3.375 g     3.375 g 100 mL/hr over 30 Minutes Intravenous  Once 10/21/14 1425 10/21/14 1600        Objective:   Filed Vitals:   10/21/14 1745 10/21/14 1856 10/21/14 2133 10/22/14 0500  BP:  156/66 170/83 136/63  Pulse:  72 78 74  Temp:   98.2 F (36.8 C) 98.4 F (36.9 C)  TempSrc:   Oral Oral  Resp:   20 22  Height: 5\' 8"  (1.727 m)     Weight: 76.4 kg (168 lb 6.9 oz)   76.4 kg (168 lb 6.9 oz)  SpO2:  96% 94% 96%    Wt Readings from Last 3 Encounters:  10/22/14 76.4 kg (168 lb 6.9 oz)  08/29/14 76.386 kg (168 lb 6.4 oz)  08/13/14 76.5 kg (168 lb 10.4 oz)     Intake/Output Summary (Last 24 hours) at 10/22/14 1125 Last data filed at 10/21/14 1432  Gross per 24 hour  Intake      0 ml  Output    225 ml  Net   -225 ml     Physical Exam  Awake , oriented 1, No new F.N deficits, Normal affect [place].AT,PERRAL Supple Neck,No JVD, No cervical lymphadenopathy appriciated.  Symmetrical Chest wall movement, Good air movement bilaterally, CTAB RRR,No Gallops,Rubs or new Murmurs, No Parasternal Heave +ve B.Sounds, Abd Soft, No tenderness, No organomegaly appriciated, No rebound - guarding or rigidity. No Cyanosis, Clubbing or edema, No new Rash or bruise , right foot on the lateral aspect has a 1 cm oblong-shaped ulcer which appears to be at least stage III. With surrounding cellulitis and some puslike discharge.    Data Review:   Micro Results Recent Results (from the past 240 hour(s))    Blood culture (routine x 2)     Status: None (Preliminary result)   Collection Time: 10/21/14  1:00 PM  Result Value Ref Range Status   Specimen Description BLOOD RIGHT ANTECUBITAL  Final   Special Requests BOTTLES DRAWN AEROBIC AND ANAEROBIC 5CC  Final   Culture PENDING  Incomplete   Report Status PENDING  Incomplete  Blood culture (routine x 2)  Status: None (Preliminary result)   Collection Time: 10/21/14  2:57 PM  Result Value Ref Range Status   Specimen Description BLOOD RIGHT FOREARM  Final   Special Requests BOTTLES DRAWN AEROBIC AND ANAEROBIC 10ML  Final   Culture PENDING  Incomplete   Report Status PENDING  Incomplete    Radiology Reports Dg Chest 2 View  10/21/2014   CLINICAL DATA:  Substernal chest pain since this morning.  EXAM: CHEST  2 VIEW  COMPARISON:  08/12/2014, 08/08/2014 and 08/27/2013  FINDINGS: Lungs are somewhat hypoinflated with airspace opacification over the left lower lobe/ lingula. No definite effusion. Minimal prominence of the perihilar markings. Mild stable cardiomegaly. There is calcified plaque over the thoracoabdominal aorta. Stable spinal compression fractures.  IMPRESSION: Hypoinflation with airspace consolidation over the left lower lobe/lingula likely a pneumonia.  Mild stable cardiomegaly with suggestion of minimal vascular congestion.  Stable spinal compression fractures for   Electronically Signed   By: Marin Olp M.D.   On: 10/21/2014 13:58     CBC  Recent Labs Lab 10/21/14 1300 10/22/14 0513  WBC 17.2* 15.1*  HGB 12.1 10.5*  HCT 36.9 32.7*  PLT 338 308  MCV 83.3 83.4  MCH 27.3 26.8  MCHC 32.8 32.1  RDW 14.6 14.8    Chemistries   Recent Labs Lab 10/21/14 1300 10/21/14 1826 10/22/14 0513  NA 134* 135 133*  K 4.5 3.8 4.3  CL 98* 102 100*  CO2 24 23 23   GLUCOSE 361* 328* 422*  BUN 59* 51* 50*  CREATININE 2.20* 1.85* 1.81*  CALCIUM 8.8* 8.3* 8.0*  MG  --  1.6*  --   AST  --  15  --   ALT  --  12*  --   ALKPHOS  --   94  --   BILITOT  --  0.6  --    ------------------------------------------------------------------------------------------------------------------ estimated creatinine clearance is 19.6 mL/min (by C-G formula based on Cr of 1.81). ------------------------------------------------------------------------------------------------------------------ No results for input(s): HGBA1C in the last 72 hours. ------------------------------------------------------------------------------------------------------------------ No results for input(s): CHOL, HDL, LDLCALC, TRIG, CHOLHDL, LDLDIRECT in the last 72 hours. ------------------------------------------------------------------------------------------------------------------ No results for input(s): TSH, T4TOTAL, T3FREE, THYROIDAB in the last 72 hours.  Invalid input(s): FREET3 ------------------------------------------------------------------------------------------------------------------ No results for input(s): VITAMINB12, FOLATE, FERRITIN, TIBC, IRON, RETICCTPCT in the last 72 hours.  Coagulation profile No results for input(s): INR, PROTIME in the last 168 hours.  No results for input(s): DDIMER in the last 72 hours.  Cardiac Enzymes  Recent Labs Lab 10/22/14 0513  TROPONINI 0.09*   ------------------------------------------------------------------------------------------------------------------ Invalid input(s): POCBNP   Time Spent in minutes 35   SINGH,PRASHANT K M.D on 10/22/2014 at 11:25 AM  Between 7am to 7pm - Pager - [phone]  After 7pm go to [email or web address] - password [facility]  [facility] -  Office  [phone]

## 2014-10-23 DIAGNOSIS — E1140 Type 2 diabetes mellitus with diabetic neuropathy, unspecified: Secondary | ICD-10-CM

## 2014-10-23 DIAGNOSIS — L8990 Pressure ulcer of unspecified site, unspecified stage: Secondary | ICD-10-CM

## 2014-10-23 LAB — BASIC METABOLIC PANEL
ANION GAP: 8 (ref 5–15)
BUN: 59 mg/dL — ABNORMAL HIGH (ref 6–20)
CHLORIDE: 103 mmol/L (ref 101–111)
CO2: 25 mmol/L (ref 22–32)
Calcium: 7.9 mg/dL — ABNORMAL LOW (ref 8.9–10.3)
Creatinine, Ser: 2 mg/dL — ABNORMAL HIGH (ref 0.44–1.00)
GFR calc non Af Amer: 20 mL/min — ABNORMAL LOW (ref >60)
GFR, EST AFRICAN AMERICAN: 24 mL/min — AB (ref >60)
GLUCOSE: 141 mg/dL — AB (ref 65–99)
POTASSIUM: 3.9 mmol/L (ref 3.5–5.1)
Sodium: 136 mmol/L (ref 135–145)

## 2014-10-23 LAB — GLUCOSE, CAPILLARY
GLUCOSE-CAPILLARY: 184 mg/dL — AB (ref 65–99)
GLUCOSE-CAPILLARY: 328 mg/dL — AB (ref 65–99)
GLUCOSE-CAPILLARY: 225 mg/dL — AB (ref 65–99)
Glucose-Capillary: 131 mg/dL — ABNORMAL HIGH (ref 65–99)

## 2014-10-23 LAB — CBC
HEMATOCRIT: 29.8 % — AB (ref 36.0–46.0)
HEMOGLOBIN: 9.3 g/dL — AB (ref 12.0–15.0)
MCH: 26.1 pg (ref 26.0–34.0)
MCHC: 31.2 g/dL (ref 30.0–36.0)
MCV: 83.7 fL (ref 78.0–100.0)
Platelets: 325 10*3/uL (ref 150–400)
RBC: 3.56 MIL/uL — AB (ref 3.87–5.11)
RDW: 14.8 % (ref 11.5–15.5)
WBC: 13.4 10*3/uL — AB (ref 4.0–10.5)

## 2014-10-23 MED ORDER — SODIUM CHLORIDE 0.9 % IV SOLN: INTRAVENOUS | Status: DC

## 2014-10-23 NOTE — Clinical Social Work Note (Signed)
Clinical Social Work Assessment  Patient Details  Name: [name] MRN: [id number] Date of Birth: 10/04/1919  Date of referral:  10/23/14               Reason for consult:  Facility Placement                Permission sought to share information with:  Family Supports Permission granted to share information::  Yes, Verbal Permission Granted  Name::     Aretha Parrot patient's daughter  Agency::  SNF placement  Relationship::     Contact Information:     Housing/Transportation Living arrangements for the past 2 months:  Single Family Home Source of Information:  Patient, Adult Children Patient Interpreter Needed:  None Criminal Activity/Legal Involvement Pertinent to Current Situation/Hospitalization:  No - Comment as needed Significant Relationships:  Adult Children Lives with:  Self Do you feel safe going back to the place where you live?   (Patient needs some short term rehab before she is able to return back home.) Need for family participation in patient care:  Yes (Comment) (Patient has some confusion at times)  Care giving concerns:  Patient's daughter feels like patient needs some short term rehab in order to return back home.   Social Worker assessment / plan: Patient is a pleasant 81 year old female who lives on her own with her two cats.  Patient had some confusion yesterday but today she was alert and oriented x3.  Patient requested that CSW speak with her daughter Aretha Parrot about SNF placement for short term rehab.  Patient's daughter requested that she would like her to return to [facility], but if there are not any beds available she is open to other SNFs.  Patient's daughter stated she is is looking at ALFs for patient to move to in the future, but currently the plan is for patient to return back home.  CSW explained SNF process which daughter was familiar with since patient has been in rehab in the past.  Patient's daughter is concerned about how many more days patient has  and what the copays would be.  CSW informed her that unit CSW can contact insurance and inform patient's daughter.  Employment status:  Retired Nurse, adult PT Recommendations:  [place] / Referral to community resources:  [place]  Patient/Family's Response to care:  Patient and family are agreeable to going to SNF for short term rehab.  Patient/Family's Understanding of and Emotional Response to Diagnosis, Current Treatment, and Prognosis:  Patient's family is aware of current prognosis and current treatment plan.  Emotional Assessment Appearance:  Appears stated age Attitude/Demeanor/Rapport:    Affect (typically observed):  Calm, Stable, Appropriate Orientation:  Oriented to Self, Oriented to Place, Oriented to Situation Alcohol / Substance use:  Not Applicable Psych involvement (Current and /or in the community):  No (Comment)  Discharge Needs  Concerns to be addressed:  Lack of Support Readmission within the last 30 days:  No Current discharge risk:  Lives alone Barriers to Discharge:  No Barriers Identified   Anell Barr 10/23/2014, 6:24 PM

## 2014-10-23 NOTE — Progress Notes (Addendum)
Physical Therapy Treatment Patient Details Name: [name] MRN: [id number] DOB: 29-Mar-1919 Today's Date: 10/23/2014    History of Present Illness [name] a 81 y/o female with a PMH of HTN, DM, R foot osteomyelitis s/p 1st ray amputation, now with L foot wound and was scheduled for I&D on 10/22/14. She presented to the [facility] with substernal CP, non radiating, sharp, intermittent, 5/10 in intensity at rest.     PT Comments    Pt fatigues quickly, but o2 sat remained in 90s today while on o2.  Pt happy to get OOB and sit up in recliner. Pt s/p R foot I & D with no WB restrictions noted in chart, but limited WB with transfers. Con't to recommend SNF.  Follow Up Recommendations  SNF;Supervision/Assistance - 24 hour     Equipment Recommendations  None recommended by PT    Recommendations for Other Services       Precautions / Restrictions Precautions Precautions: Fall Precaution Comments: Watch O2 sats Restrictions Weight Bearing Restrictions: No    Mobility  Bed Mobility Overal bed mobility: Needs Assistance Bed Mobility: Supine to Sit     Supine to sit: Mod assist;+2 for physical assistance     General bed mobility comments: Cues for technique. Pt attempting to help more, but due to weakness needed A of 2.  Transfers Overall transfer level: Needs assistance Equipment used: 2 person hand held assist Transfers: Stand Pivot Transfers;Sit to/from Stand Sit to Stand: +2 physical assistance;Mod assist Stand pivot transfers: +2 physical assistance;Mod assist       General transfer comment: Pt with no WB restrictions on R foot, but limited WB via SPT with HHA of 2. Pt with no c/o increased pain.    Ambulation/Gait             General Gait Details: n/a   Stairs            Wheelchair Mobility    Modified Rankin (Stroke Patients Only)       Balance Overall balance assessment: Needs assistance Sitting-balance support: Feet supported Sitting balance-Leahy  Scale: Fair     Standing balance support: Bilateral upper extremity supported Standing balance-Leahy Scale: Poor                      Cognition Arousal/Alertness: Awake/alert Behavior During Therapy: Flat affect Overall Cognitive Status: No family/caregiver present to determine baseline cognitive functioning                      Exercises      General Comments General comments (skin integrity, edema, etc.): Pt fatigued with transfer but happy to be out of the bed.      Pertinent Vitals/Pain Pain Assessment: No/denies pain    Home Living                      Prior Function            PT Goals (current goals can now be found in the care plan section) Acute Rehab PT Goals Patient Stated Goal: See her cats PT Goal Formulation: With patient Time For Goal Achievement: 11/05/14 Potential to Achieve Goals: Fair Progress towards PT goals: Progressing toward goals    Frequency  Min 3X/week    PT Plan Current plan remains appropriate    Co-evaluation             End of Session Equipment Utilized During Treatment: Oxygen Activity Tolerance: Patient  limited by fatigue Patient left: in chair;with call bell/phone within reach;with chair alarm set     Time: 1005-1020 PT Time Calculation (min) (ACUTE ONLY): 15 min  Charges:  $Therapeutic Activity: 8-22 mins                    G Codes:      SMITH,KAREN LUBECK 10/23/2014, 1:21 PM

## 2014-10-23 NOTE — Clinical Social Work Placement (Signed)
   CLINICAL SOCIAL WORK PLACEMENT  NOTE  Date:  10/23/2014  Patient Details  Name: [name] MRN: [id number] Date of Birth: [birth year]  Clinical Social Work is seeking post-discharge placement for this patient at the [place] level of care (*CSW will initial, date and re-position this form in  chart as items are completed):  Yes   Patient/family provided with [place] Work Department's list of facilities offering this level of care within the geographic area requested by the patient (or if unable, by the patient's family).  Yes   Patient/family informed of their freedom to choose among providers that offer the needed level of care, that participate in Medicare, Medicaid or managed care program needed by the patient, have an available bed and are willing to accept the patient.  Yes   Patient/family informed of [facility]'s ownership interest in [facility] and [facility], as well as of the fact that they are under no obligation to receive care at these facilities.  PASRR submitted to EDS on 10/23/14     PASRR number received on       Existing PASRR number confirmed on 10/23/14     FL2 transmitted to all facilities in geographic area requested by pt/family on 10/23/14     FL2 transmitted to all facilities within larger geographic area on       Patient informed that his/her managed care company has contracts with or will negotiate with certain facilities, including the following:            Patient/family informed of bed offers received.  Patient chooses bed at       Physician recommends and patient chooses bed at      Patient to be transferred to   on  .  Patient to be transferred to facility by       Patient family notified on   of transfer.  Name of family member notified:        PHYSICIAN Please sign FL2, Please sign DNR     Additional Comment:    _______________________________________________ Ross Ludwig,  LCSWA 10/23/2014, 6:35 PM

## 2014-10-23 NOTE — Progress Notes (Signed)
Floor coverage Baltazar Najjar NP paged regarding pt's BP of 102/50 manually.

## 2014-10-23 NOTE — Progress Notes (Signed)
[facility] PROGRESS NOTE  [name]:[id number] DOB: 1919-08-25 DOA: 10/21/2014 PCP: Jani Gravel, MD  Assessment/Plan: 1. Acute hypoxic respiratory failure due to HCAP - patient remains on empiric antibiotics, supportive care with oxygen nebulizer treatment as needed. Leukocytosis improving clinically better. Breathing improved. Titrate off oxygen as possible.  2. Chronic right foot ulcer and wound with some surrounding cellulitis. Dr. Ninfa Linden orthopedics was consulted and underwent bedside debridement on 9/27. Per above, patient remains on empiric antibiotics. We'll require wound care here and after discharge.  3. Chest pain upon admission. Currently chest pain-free, EKG was nonacute, troponin mildly elevated with cycle 2 document trend for prognostic purposes, she is a candidate for medical treatment only. On aspirin, beta blocker and Imdur which will be continued. Due to advanced age. We'll candidate for statin initiation. Will not repeat echo as it would not change management. Not a candidate for invasive procedures or testing.  4. Chronic diastolic CHF. EF was about 50% in 2014. Mildly dehydrated, hydrate and monitor.   5. Essential hypertension she is currently on a combination of Coreg, Imdur and Cardizem which will be continued.   6. ARF on Chronic kidney disease stage IV. Baseline creatinine appears to be close to 1.6. Cr up to 2.0 today. Mildly dehydrated, Cont to hydrate and monitor.   7. Hypothyroidism. On Synthroid continue.   8. UTI upon admission. On empiric antibiotic monitor cultures.   9. DM type II. Poor control on Lantus, pre-meal NovoLog and sliding scale. We'll monitor CBGs.  Code Status: DNR Family Communication: Pt in room (indicate person spoken with, relationship, and if by phone, the number) Disposition Plan: Pending   Consultants:  Orthopedic Surgery  Procedures:  I/D at bedside 9/27  Antibiotics: Anti-infectives    Start      Dose/Rate Route Frequency Ordered Stop   10/23/14 1600  vancomycin (VANCOCIN) IVPB 1000 mg/200 mL premix     1,000 mg 200 mL/hr over 60 Minutes Intravenous Every 48 hours 10/21/14 1812     10/21/14 2200  piperacillin-tazobactam (ZOSYN) IVPB 2.25 g     2.25 g 100 mL/hr over 30 Minutes Intravenous 3 times per day 10/21/14 1826     10/21/14 1745  cefTAZidime (FORTAZ) 1 g in dextrose 5 % 50 mL IVPB  Status:  Discontinued     1 g 100 mL/hr over 30 Minutes Intravenous 3 times per day 10/21/14 1733 10/21/14 1826   10/21/14 1500  vancomycin (VANCOCIN) 1,500 mg in sodium chloride 0.9 % 500 mL IVPB     1,500 mg 250 mL/hr over 120 Minutes Intravenous  Once 10/21/14 1425 10/21/14 1800   10/21/14 1430  piperacillin-tazobactam (ZOSYN) IVPB 3.375 g     3.375 g 100 mL/hr over 30 Minutes Intravenous  Once 10/21/14 1425 10/21/14 1600      HPI/Subjective: Unable to obtain from patient given mental status  Objective: Filed Vitals:   10/23/14 0458 10/23/14 0509 10/23/14 0542 10/23/14 0843  BP:  97/46 102/50 122/37  Pulse:  62    Temp:  99 F (37.2 C)    TempSrc:  Oral    Resp:  24    Height:      Weight: 73.8 kg (162 lb 11.2 oz)     SpO2:  95%      Intake/Output Summary (Last 24 hours) at 10/23/14 1207 Last data filed at 10/23/14 1157  Gross per 24 hour  Intake 1784.5 ml  Output      0 ml  Net 1784.5 ml  Filed Weights   10/21/14 1745 10/22/14 0500 10/23/14 0458  Weight: 76.4 kg (168 lb 6.9 oz) 76.4 kg (168 lb 6.9 oz) 73.8 kg (162 lb 11.2 oz)    Exam:   General:  Asleep, arousable, in nad  Cardiovascular: regular, s1, s2  Respiratory: normal resp effort, no wheezing  Abdomen: soft, nondistended  Musculoskeletal: perfused, no clubbing   Data Reviewed: Basic Metabolic Panel:  Recent Labs Lab 10/21/14 1300 10/21/14 1826 10/22/14 0513 10/23/14 0753  NA 134* 135 133* 136  K 4.5 3.8 4.3 3.9  CL 98* 102 100* 103  CO2 24 23 23 25   GLUCOSE 361* 328* 422* 141*  BUN 59* 51*  50* 59*  CREATININE 2.20* 1.85* 1.81* 2.00*  CALCIUM 8.8* 8.3* 8.0* 7.9*  MG  --  1.6*  --   --   PHOS  --  3.0  --   --    Liver Function Tests:  Recent Labs Lab 10/21/14 1826  AST 15  ALT 12*  ALKPHOS 94  BILITOT 0.6  PROT 7.0  ALBUMIN 2.4*   No results for input(s): LIPASE, AMYLASE in the last 168 hours. No results for input(s): AMMONIA in the last 168 hours. CBC:  Recent Labs Lab 10/21/14 1300 10/22/14 0513 10/23/14 0753  WBC 17.2* 15.1* 13.4*  HGB 12.1 10.5* 9.3*  HCT 36.9 32.7* 29.8*  MCV 83.3 83.4 83.7  PLT 338 308 325   Cardiac Enzymes:  Recent Labs Lab 10/22/14 0513 10/22/14 1317 10/22/14 1818  TROPONINI 0.09* 0.04* 0.03   BNP (last 3 results) No results for input(s): BNP in the last 8760 hours.  ProBNP (last 3 results) No results for input(s): PROBNP in the last 8760 hours.  CBG:  Recent Labs Lab 10/22/14 1218 10/22/14 1701 10/22/14 2148 10/23/14 0752 10/23/14 1155  GLUCAP 250* 403* 160* 131* 184*    Recent Results (from the past 240 hour(s))  Blood culture (routine x 2)     Status: None (Preliminary result)   Collection Time: 10/21/14  1:00 PM  Result Value Ref Range Status   Specimen Description BLOOD RIGHT ANTECUBITAL  Final   Special Requests BOTTLES DRAWN AEROBIC AND ANAEROBIC 5CC  Final   Culture NO GROWTH 1 DAY  Final   Report Status PENDING  Incomplete  Blood culture (routine x 2)     Status: None (Preliminary result)   Collection Time: 10/21/14  2:57 PM  Result Value Ref Range Status   Specimen Description BLOOD RIGHT FOREARM  Final   Special Requests BOTTLES DRAWN AEROBIC AND ANAEROBIC 10ML  Final   Culture NO GROWTH 1 DAY  Final   Report Status PENDING  Incomplete     Studies: Dg Chest 2 View  10/21/2014   CLINICAL DATA:  Substernal chest pain since this morning.  EXAM: CHEST  2 VIEW  COMPARISON:  08/12/2014, 08/08/2014 and 08/27/2013  FINDINGS: Lungs are somewhat hypoinflated with airspace opacification over the left  lower lobe/ lingula. No definite effusion. Minimal prominence of the perihilar markings. Mild stable cardiomegaly. There is calcified plaque over the thoracoabdominal aorta. Stable spinal compression fractures.  IMPRESSION: Hypoinflation with airspace consolidation over the left lower lobe/lingula likely a pneumonia.  Mild stable cardiomegaly with suggestion of minimal vascular congestion.  Stable spinal compression fractures for   Electronically Signed   By: Marin Olp M.D.   On: 10/21/2014 13:58    Scheduled Meds: . aspirin EC  81 mg Oral Daily  . carvedilol  6.25 mg Oral BID WC  .  diltiazem  180 mg Oral QHS  . feeding supplement (GLUCERNA SHAKE)  237 mL Oral BID BM  . gabapentin  300 mg Oral QHS  . heparin subcutaneous  5,000 Units Subcutaneous 3 times per day  . insulin aspart  0-15 Units Subcutaneous TID WC  . insulin aspart  0-5 Units Subcutaneous QHS  . insulin aspart  3 Units Subcutaneous TID WC  . insulin glargine  40 Units Subcutaneous q morning - 10a  . isosorbide mononitrate  30 mg Oral QHS  . levothyroxine  175 mcg Oral QAC breakfast  . multivitamin with minerals  1 tablet Oral Daily  . nutrition supplement (JUVEN)  1 packet Oral BID WC  . piperacillin-tazobactam (ZOSYN)  IV  2.25 g Intravenous 3 times per day  . tamsulosin  0.4 mg Oral QPC breakfast  . vancomycin  1,000 mg Intravenous Q48H   Continuous Infusions: . sodium chloride 75 mL/hr at 10/23/14 1054    Principal Problem:   Acute respiratory failure with hypoxia Active Problems:   HYPERTENSION, BENIGN ESSENTIAL   Hypothyroidism   Coronary atherosclerosis of native coronary artery   Diabetic neuropathy   HCAP (healthcare-associated pneumonia)   UTI (urinary tract infection)   Leukocytosis   Diabetes mellitus with renal manifestations, uncontrolled   Chronic ulcer of left foot   Other chest pain   Chronic diastolic CHF (congestive heart failure)    CH[facility], [facility] Pager [phone].  If 7PM-7AM, please contact night-coverage at [email or web address], password [facility] 10/23/2014, 12:07 PM  LOS: 2 days

## 2014-10-24 ENCOUNTER — Inpatient Hospital Stay (HOSPITAL_COMMUNITY): Payer: PPO

## 2014-10-24 DIAGNOSIS — I5033 Acute on chronic diastolic (congestive) heart failure: Secondary | ICD-10-CM

## 2014-10-24 LAB — GLUCOSE, CAPILLARY
GLUCOSE-CAPILLARY: 143 mg/dL — AB (ref 65–99)
GLUCOSE-CAPILLARY: 185 mg/dL — AB (ref 65–99)
Glucose-Capillary: 129 mg/dL — ABNORMAL HIGH (ref 65–99)
Glucose-Capillary: 258 mg/dL — ABNORMAL HIGH (ref 65–99)
Glucose-Capillary: 325 mg/dL — ABNORMAL HIGH (ref 65–99)

## 2014-10-24 LAB — BASIC METABOLIC PANEL
Anion gap: 8 (ref 5–15)
BUN: 55 mg/dL — ABNORMAL HIGH (ref 6–20)
CALCIUM: 8.1 mg/dL — AB (ref 8.9–10.3)
CO2: 24 mmol/L (ref 22–32)
CREATININE: 1.73 mg/dL — AB (ref 0.44–1.00)
Chloride: 106 mmol/L (ref 101–111)
GFR calc Af Amer: 28 mL/min — ABNORMAL LOW (ref >60)
GFR calc non Af Amer: 24 mL/min — ABNORMAL LOW (ref >60)
GLUCOSE: 152 mg/dL — AB (ref 65–99)
Potassium: 4.1 mmol/L (ref 3.5–5.1)
Sodium: 138 mmol/L (ref 135–145)

## 2014-10-24 LAB — CBC
HEMATOCRIT: 30.8 % — AB (ref 36.0–46.0)
Hemoglobin: 9.8 g/dL — ABNORMAL LOW (ref 12.0–15.0)
MCH: 26.5 pg (ref 26.0–34.0)
MCHC: 31.8 g/dL (ref 30.0–36.0)
MCV: 83.2 fL (ref 78.0–100.0)
PLATELETS: 349 10*3/uL (ref 150–400)
RBC: 3.7 MIL/uL — ABNORMAL LOW (ref 3.87–5.11)
RDW: 14.8 % (ref 11.5–15.5)
WBC: 14.1 10*3/uL — ABNORMAL HIGH (ref 4.0–10.5)

## 2014-10-24 MED ORDER — FUROSEMIDE 10 MG/ML IJ SOLN
40.0000 mg | Freq: Once | INTRAMUSCULAR | Status: AC
  Administered 2014-10-24: 40 mg via INTRAVENOUS
  Filled 2014-10-24: qty 4

## 2014-10-24 MED ORDER — IPRATROPIUM-ALBUTEROL 0.5-2.5 (3) MG/3ML IN SOLN
3.0000 mL | RESPIRATORY_TRACT | Status: DC | PRN
  Administered 2014-10-24: 3 mL via RESPIRATORY_TRACT
  Filled 2014-10-24: qty 3

## 2014-10-24 MED ORDER — DOXYCYCLINE HYCLATE 100 MG PO TABS
100.0000 mg | ORAL_TABLET | Freq: Two times a day (BID) | ORAL | Status: DC
  Administered 2014-10-24: 100 mg via ORAL
  Filled 2014-10-24: qty 1

## 2014-10-24 MED ORDER — PIPERACILLIN-TAZOBACTAM IN DEX 2-0.25 GM/50ML IV SOLN
2.2500 g | Freq: Three times a day (TID) | INTRAVENOUS | Status: DC
  Administered 2014-10-24 – 2014-10-25 (×3): 2.25 g via INTRAVENOUS
  Filled 2014-10-24 (×5): qty 50

## 2014-10-24 NOTE — Patient Care Conference (Signed)
Attempted to call pt's daughter, [name], at number listed for update. No answer. Will try again later.

## 2014-10-24 NOTE — Progress Notes (Signed)
[facility] PROGRESS NOTE  [name] GYB:[id number] DOB: Dec 26, 1919 DOA: 10/21/2014 PCP: Jani Gravel, MD  Assessment/Plan: 1. Acute hypoxic respiratory failure due to HCAP - patient remains on empiric antibiotics, supportive care with oxygen nebulizer treatment as needed. Leukocytosis slightly worsened although pt seems to be clinically improved. Pt remains O2 dependent. Repeat CXR done this AM with worsening airspace disease worrisome for PNA with pulm edema. [name] 4L pos today. Will give one dose of IV lasix. As CXR with concerns for possible aspiration, will cont on zosyn for now  2. Chronic right foot ulcer and wound with some surrounding cellulitis. Dr. Ninfa Linden orthopedics was consulted and underwent bedside debridement on 9/27. Per above, patient remains on antibiotics per above. We'll require wound care here and after discharge.  3. Chest pain upon admission. Currently chest pain-free, EKG was nonacute, troponin mildly elevated with cycle 2 document trend for prognostic purposes, she is a candidate for medical treatment only. On aspirin, beta blocker and Imdur which will be continued. Due to advanced age. We'll candidate for statin initiation. Will not repeat echo as it would not change management. Not a candidate for invasive procedures or testing.  4. Suspected acute on chronic diastolic CHF. EF was about 50% in 2014 with grade 2 diastolic dysfunction. [name] 4L positive. CXR with evidence of pulm edema. Will give one dose of IV lasix. Check daily wts and i/o   5. Essential hypertension she is currently on a combination of Coreg, Imdur and Cardizem which will be continued.   6. ARF on Chronic kidney disease stage IV. Baseline creatinine appears to be close to 1.6 which is near today's Cr.   7. Hypothyroidism. On Synthroid continue.   8. UTI upon admission. On empiric antibiotic monitor cultures.   9. DM type II. Poor control on Lantus, pre-meal NovoLog and sliding scale.  We'll monitor CBGs.  Code Status: DNR Family Communication: Pt in room Disposition Plan: Pending   Consultants:  Orthopedic Surgery  Procedures:  I/D at bedside 9/27  Antibiotics: Anti-infectives    Start     Dose/Rate Route Frequency Ordered Stop   10/24/14 1400  piperacillin-tazobactam (ZOSYN) IVPB 2.25 g     2.25 g 100 mL/hr over 30 Minutes Intravenous 3 times per day 10/24/14 1216     10/24/14 1000  doxycycline (VIBRA-TABS) tablet 100 mg  Status:  Discontinued     100 mg Oral Every 12 hours 10/24/14 0945 10/24/14 1215   10/23/14 1600  vancomycin (VANCOCIN) IVPB 1000 mg/200 mL premix  Status:  Discontinued     1,000 mg 200 mL/hr over 60 Minutes Intravenous Every 48 hours 10/21/14 1812 10/24/14 0944   10/21/14 2200  piperacillin-tazobactam (ZOSYN) IVPB 2.25 g  Status:  Discontinued     2.25 g 100 mL/hr over 30 Minutes Intravenous 3 times per day 10/21/14 1826 10/24/14 0944   10/21/14 1745  cefTAZidime (FORTAZ) 1 g in dextrose 5 % 50 mL IVPB  Status:  Discontinued     1 g 100 mL/hr over 30 Minutes Intravenous 3 times per day 10/21/14 1733 10/21/14 1826   10/21/14 1500  vancomycin (VANCOCIN) 1,500 mg in sodium chloride 0.9 % 500 mL IVPB     1,500 mg 250 mL/hr over 120 Minutes Intravenous  Once 10/21/14 1425 10/21/14 1800   10/21/14 1430  piperacillin-tazobactam (ZOSYN) IVPB 3.375 g     3.375 g 100 mL/hr over 30 Minutes Intravenous  Once 10/21/14 1425 10/21/14 1600      HPI/Subjective:  States feeling better today, eager to be discharged. Denies sob  Objective: Filed Vitals:   10/24/14 0500 10/24/14 0658 10/24/14 0831 10/24/14 1202  BP:  117/45 135/52   Pulse:  74    Temp:  98.6 F (37 C)    TempSrc:  Oral    Resp:  28 28   Height:      Weight: 73.9 kg (162 lb 14.7 oz)     SpO2:  92% 97% 85%    Intake/Output Summary (Last 24 hours) at 10/24/14 1218 Last data filed at 10/24/14 0900  Gross per 24 hour  Intake 2365.75 ml  Output      0 ml  Net 2365.75 ml    Filed Weights   10/22/14 0500 10/23/14 0458 10/24/14 0500  Weight: 76.4 kg (168 lb 6.9 oz) 73.8 kg (162 lb 11.2 oz) 73.9 kg (162 lb 14.7 oz)    Exam:   General:  Awake, sitting in chair, in nad  Cardiovascular: regular, s1, s2  Respiratory: normal resp effort, no wheezing  Abdomen: soft, nondistended  Musculoskeletal: perfused, no clubbing   Data Reviewed: Basic Metabolic Panel:  Recent Labs Lab 10/21/14 1300 10/21/14 1826 10/22/14 0513 10/23/14 0753 10/24/14 0502  NA 134* 135 133* 136 138  K 4.5 3.8 4.3 3.9 4.1  CL 98* 102 100* 103 106  CO2 24 23 23 25 24   GLUCOSE 361* 328* 422* 141* 152*  BUN 59* 51* 50* 59* 55*  CREATININE 2.20* 1.85* 1.81* 2.00* 1.73*  CALCIUM 8.8* 8.3* 8.0* 7.9* 8.1*  MG  --  1.6*  --   --   --   PHOS  --  3.0  --   --   --    Liver Function Tests:  Recent Labs Lab 10/21/14 1826  AST 15  ALT 12*  ALKPHOS 94  BILITOT 0.6  PROT 7.0  ALBUMIN 2.4*   No results for input(s): LIPASE, AMYLASE in the last 168 hours. No results for input(s): AMMONIA in the last 168 hours. CBC:  Recent Labs Lab 10/21/14 1300 10/22/14 0513 10/23/14 0753 10/24/14 0502  WBC 17.2* 15.1* 13.4* 14.1*  HGB 12.1 10.5* 9.3* 9.8*  HCT 36.9 32.7* 29.8* 30.8*  MCV 83.3 83.4 83.7 83.2  PLT 338 308 325 349   Cardiac Enzymes:  Recent Labs Lab 10/22/14 0513 10/22/14 1317 10/22/14 1818  TROPONINI 0.09* 0.04* 0.03   BNP (last 3 results) No results for input(s): BNP in the last 8760 hours.  ProBNP (last 3 results) No results for input(s): PROBNP in the last 8760 hours.  CBG:  Recent Labs Lab 10/23/14 1708 10/23/14 2152 10/24/14 0739 10/24/14 0809 10/24/14 1147  GLUCAP 328* 225* 143* 129* 258*    Recent Results (from the past 240 hour(s))  Blood culture (routine x 2)     Status: None (Preliminary result)   Collection Time: 10/21/14  1:00 PM  Result Value Ref Range Status   Specimen Description BLOOD RIGHT ANTECUBITAL  Final   Special  Requests BOTTLES DRAWN AEROBIC AND ANAEROBIC 5CC  Final   Culture NO GROWTH 2 DAYS  Final   Report Status PENDING  Incomplete  Blood culture (routine x 2)     Status: None (Preliminary result)   Collection Time: 10/21/14  2:57 PM  Result Value Ref Range Status   Specimen Description BLOOD RIGHT FOREARM  Final   Special Requests BOTTLES DRAWN AEROBIC AND ANAEROBIC 10ML  Final   Culture NO GROWTH 2 DAYS  Final   Report Status  PENDING  Incomplete     Studies: Dg Chest Port 1 View  10/24/2014   CLINICAL DATA:  Congestive heart failure. Healthcare associated pneumonia. Hypertension.  EXAM: PORTABLE CHEST 1 VIEW  COMPARISON:  10/21/2014 chest radiograph.  FINDINGS: Allowing for portable technique, the pulmonary are a shin has worsened compared to the prior study. There is increasing bilateral basilar airspace opacity. This probably represents a combination of pulmonary edema superimposed on pneumonia or aspiration pneumonitis. The cardiopericardial silhouette is upper limits of normal for projection. Aortic arch atherosclerosis. Probable small LEFT pleural effusion.  IMPRESSION: Worsening airspace disease consistent with a combination of pneumonia or aspiration pneumonitis and superimposed pulmonary edema.   Electronically Signed   By: Dereck Ligas M.D.   On: 10/24/2014 10:00    Scheduled Meds: . aspirin EC  81 mg Oral Daily  . carvedilol  6.25 mg Oral BID WC  . diltiazem  180 mg Oral QHS  . feeding supplement (GLUCERNA SHAKE)  237 mL Oral BID BM  . furosemide  40 mg Intravenous Once  . gabapentin  300 mg Oral QHS  . heparin subcutaneous  5,000 Units Subcutaneous 3 times per day  . insulin aspart  0-15 Units Subcutaneous TID WC  . insulin aspart  0-5 Units Subcutaneous QHS  . insulin aspart  3 Units Subcutaneous TID WC  . insulin glargine  40 Units Subcutaneous q morning - 10a  . isosorbide mononitrate  30 mg Oral QHS  . levothyroxine  175 mcg Oral QAC breakfast  . multivitamin with  minerals  1 tablet Oral Daily  . nutrition supplement (JUVEN)  1 packet Oral BID WC  . piperacillin-tazobactam (ZOSYN)  IV  2.25 g Intravenous 3 times per day  . tamsulosin  0.4 mg Oral QPC breakfast   Continuous Infusions:    Principal Problem:   Acute respiratory failure with hypoxia Active Problems:   HYPERTENSION, BENIGN ESSENTIAL   Hypothyroidism   Coronary atherosclerosis of native coronary artery   Diabetic neuropathy   HCAP (healthcare-associated pneumonia)   UTI (urinary tract infection)   Leukocytosis   Diabetes mellitus with renal manifestations, uncontrolled   Chronic ulcer of left foot   Other chest pain   Chronic diastolic CHF (congestive heart failure)    [facility], [facility] Pager [phone]. If 7PM-7AM, please contact night-coverage at [email or web address], password [facility] 10/24/2014, 12:18 PM  LOS: 3 days

## 2014-10-24 NOTE — Progress Notes (Addendum)
On assessment patient's respirations labored and rate elevated 28-30. Inspiratory wheezing noted. Breath sounds diminished in bases. Dr. Wyline Copas made aware via page. Orders placed.

## 2014-10-24 NOTE — Progress Notes (Signed)
Dr. Wyline Copas made aware that patient has not has dressing changed since I&D with no orders from ortho for dressings.

## 2014-10-24 NOTE — Care Management Important Message (Signed)
Important Message  Patient Details  Name: [name] MRN: [id number] Date of Birth: 1919/03/19   Medicare Important Message Given:  Yes-second notification given    Nathen May 10/24/2014, 12:28 PM

## 2014-10-24 NOTE — Progress Notes (Signed)
Bedside visit with [name].  She is alert and oriented to person place and time.  Denies any acute symptoms such as pain or respiratory distress.  She does appear to be feeling much better today compared to my visits the past 2 days.  Her voice is stronger and she is in good spirits.  She reports that her daughter [name] is sick today.  [name] works with disabled children at a school and has not felt well since yesterday.  [name] understands why [name] can't and should not visit her at this time.  We had a long discussion about her love of cats. We talked about her 2 cats at home, [name] and [name] and how they bring her companionship. She reminisced about her childhood and how she came to be a cat lover growing up in [place],  caring for more that 20 neighborhood cats at one time.  Her memory was clear and these memories seemed to bring her great joy.  She verbalized that her daughter [name] always agreed to let her stay in her own home until she came to her own conclusion about needing a nursing facility.  [name] stated I know I need more help now and am ready to find another place to live.  I will continue to follow and support [name] in her transition.  Our conversation was brought joy to [place] both and I am encouraged to see her be able to transition to another level of care while maintaining her autonomy and dignity.  [name], RN-BC, MSN, [facility]

## 2014-10-24 NOTE — Progress Notes (Addendum)
Pharmacy Consult - Vancomycin and Zosyn  [name] is a 81 y.o. year-old female admitted on 10/21/2014.  The patient is currently on Vancomycin and Zosyn for pneumonia.   Recent Labs Lab 10/21/14 1300 10/22/14 0513 10/23/14 0753 10/24/14 0502  WBC 17.2* 15.1* 13.4* 14.1*    Recent Labs Lab 10/21/14 1300 10/21/14 1826 10/22/14 0513 10/23/14 0753 10/24/14 0502  CREATININE 2.20* 1.85* 1.81* 2.00* 1.73*   Estimated Creatinine Clearance: 20.5 mL/min (by C-G formula based on Cr of 1.73).   Antimicrobial allergies: Rocephin   Plan: De-escalating therapy After discussion with Dr. Wyline Copas, MD, the current antibiotic(s) Vancomycin and Zosyn will be narrowed to Zosyn 2.25 grams iv Q 8 hours Will continue to follow progress   Thank you Anette Guarneri, PharmD [phone]   10/24/2014 11:25 AM

## 2014-10-25 LAB — CBC
HEMATOCRIT: 31.6 % — AB (ref 36.0–46.0)
HEMOGLOBIN: 9.7 g/dL — AB (ref 12.0–15.0)
MCH: 25.6 pg — AB (ref 26.0–34.0)
MCHC: 30.7 g/dL (ref 30.0–36.0)
MCV: 83.4 fL (ref 78.0–100.0)
Platelets: 387 10*3/uL (ref 150–400)
RBC: 3.79 MIL/uL — AB (ref 3.87–5.11)
RDW: 15 % (ref 11.5–15.5)
WBC: 12.3 10*3/uL — ABNORMAL HIGH (ref 4.0–10.5)

## 2014-10-25 LAB — BASIC METABOLIC PANEL
ANION GAP: 7 (ref 5–15)
BUN: 37 mg/dL — ABNORMAL HIGH (ref 6–20)
CO2: 28 mmol/L (ref 22–32)
Calcium: 8.3 mg/dL — ABNORMAL LOW (ref 8.9–10.3)
Chloride: 104 mmol/L (ref 101–111)
Creatinine, Ser: 1.6 mg/dL — ABNORMAL HIGH (ref 0.44–1.00)
GFR calc non Af Amer: 27 mL/min — ABNORMAL LOW (ref >60)
GFR, EST AFRICAN AMERICAN: 31 mL/min — AB (ref >60)
GLUCOSE: 141 mg/dL — AB (ref 65–99)
POTASSIUM: 4.1 mmol/L (ref 3.5–5.1)
Sodium: 139 mmol/L (ref 135–145)

## 2014-10-25 LAB — GLUCOSE, CAPILLARY
Glucose-Capillary: 138 mg/dL — ABNORMAL HIGH (ref 65–99)
Glucose-Capillary: 247 mg/dL — ABNORMAL HIGH (ref 65–99)

## 2014-10-25 LAB — STREP PNEUMONIAE URINARY ANTIGEN: Strep Pneumo Urinary Antigen: NEGATIVE

## 2014-10-25 MED ORDER — AMOXICILLIN-POT CLAVULANATE 500-125 MG PO TABS
1.0000 | ORAL_TABLET | Freq: Two times a day (BID) | ORAL | Status: DC
  Administered 2014-10-25: 500 mg via ORAL
  Filled 2014-10-25 (×2): qty 1

## 2014-10-25 MED ORDER — BENZONATATE 100 MG PO CAPS: 100.0000 mg | ORAL_CAPSULE | Freq: Three times a day (TID) | ORAL | Status: DC | PRN

## 2014-10-25 MED ORDER — AMOXICILLIN-POT CLAVULANATE 500-125 MG PO TABS: 1.0000 | ORAL_TABLET | Freq: Two times a day (BID) | ORAL | Status: DC

## 2014-10-25 MED ORDER — FUROSEMIDE 20 MG PO TABS: ORAL_TABLET | ORAL | Status: DC

## 2014-10-25 NOTE — Progress Notes (Signed)
Inpatient Diabetes Program Recommendations  AACE/ADA: New Consensus Statement on Inpatient Glycemic Control (2015)  Target Ranges:  Prepandial:   less than 140 mg/dL      Peak postprandial:   less than 180 mg/dL (1-2 hours)      Critically ill patients:  140 - 180 mg/dL   Review of Glycemic Control  Inpatient Diabetes Program Recommendations:  Insulin - Meal Coverage: consider increasing Novolog with meals to 5 units TID Thank you  Raoul Pitch BSN, RN,CDE Inpatient Diabetes Coordinator [phone] (team pager)

## 2014-10-25 NOTE — Progress Notes (Signed)
Physical Therapy Treatment Patient Details Name: [name] MRN: [id number] DOB: October 17, 1919 Today's Date: 10/25/2014    History of Present Illness [name] a 81 y/o female with a PMH of HTN, DM, R foot osteomyelitis s/p 1st ray amputation, now with L foot wound and was scheduled for I&D on 10/22/14. She presented to the [facility] with substernal CP, non radiating, sharp, intermittent, 5/10 in intensity at rest.     PT Comments    Pt making steady progress. Continue to feel she needs ST-SNF.  Follow Up Recommendations  SNF     Equipment Recommendations  None recommended by PT    Recommendations for Other Services       Precautions / Restrictions Precautions Precautions: Fall Precaution Comments: Watch O2 sats Restrictions Weight Bearing Restrictions: No    Mobility  Bed Mobility Overal bed mobility: Needs Assistance Bed Mobility: Supine to Sit     Supine to sit: Mod assist     General bed mobility comments: Assist to bring trunk up  Transfers Overall transfer level: Needs assistance Equipment used: Rolling walker (2 wheeled) Transfers: Sit to/from Stand Sit to Stand: +2 physical assistance;Mod assist         General transfer comment: Assist to bring hips up  Ambulation/Gait Ambulation/Gait assistance: +2 safety/equipment;Mod assist;Min assist Ambulation Distance (Feet): 6 Feet Assistive device: Rolling walker (2 wheeled) Gait Pattern/deviations: Step-to pattern;Decreased step length - left;Decreased step length - right;Decreased stance time - right;Shuffle;Trunk flexed     General Gait Details: Assist for balance and to follow with chair.   Stairs            Wheelchair Mobility    Modified Rankin (Stroke Patients Only)       Balance Overall balance assessment: Needs assistance Sitting-balance support: No upper extremity supported;Feet supported Sitting balance-Leahy Scale: Fair     Standing balance support: Bilateral upper extremity  supported Standing balance-Leahy Scale: Poor Standing balance comment: walker and min A for static standing                    Cognition Arousal/Alertness: Awake/alert Behavior During Therapy: WFL for tasks assessed/performed Overall Cognitive Status: No family/caregiver present to determine baseline cognitive functioning                      Exercises      General Comments        Pertinent Vitals/Pain Pain Assessment: No/denies pain    Home Living                      Prior Function            PT Goals (current goals can now be found in the care plan section) Acute Rehab PT Goals Patient Stated Goal: See her cats PT Goal Formulation: With patient Time For Goal Achievement: 11/05/14 Potential to Achieve Goals: Fair Progress towards PT goals: Progressing toward goals    Frequency  Min 3X/week    PT Plan Current plan remains appropriate    Co-evaluation             End of Session Equipment Utilized During Treatment: Oxygen Activity Tolerance: Patient limited by fatigue Patient left: in chair;with call bell/phone within reach;with chair alarm set     Time:  -     Charges:  $Gait Training: 8-22 mins                    G Codes:  MAYCOCK,CARY 10/25/2014, 11:06 AM [place]

## 2014-10-25 NOTE — Care Management Note (Signed)
Case Management Note  Patient Details  Name: KORIANNA WASHER MRN: [id number] Date of Birth: 06-21-1919  Subjective/Objective:                 Patient admitted with respiratory failure and plan is to DC to SNF as facilitated through SW.   Action/Plan:   Expected Discharge Date:                  Expected Discharge Plan:  Skilled Nursing Facility  In-House Referral:  Clinical Social Work  Discharge planning Services  CM Consult  Post Acute Care Choice:    Choice offered to:     DME Arranged:    DME Agency:     HH Arranged:    [place] Agency:     Status of Service:  Completed, signed off  Medicare Important Message Given:  Yes-second notification given Date Medicare IM Given:    Medicare IM give by:    Date Additional Medicare IM Given:    Additional Medicare Important Message give by:     If discussed at [place] of Stay Meetings, dates discussed:    Additional Comments:  Carles Collet, RN 10/25/2014, 1:20 PM

## 2014-10-25 NOTE — Progress Notes (Signed)
[name] is sitting up in the chair, alert and oriented.  Reports feeling better and appears clinically improved.  Spoke by phone with daughter [name].  [name] was seen in the ED yesterday and is currently undergoing testing as well as antibiotic therapy.  She is not able to physically come to the hospital but is available by phone.  [name] and [name] both are agreeable to SNF placement.  CSW has been in contact with [facility] and requests have been faxed for bed offers.  [facility] is first choice of [name], she is familiar with the staff from several previous admissions.  Will continue to support as needed for transition.  DNR in place, will need OOF completed prior to d/c.  [name], RN-BC, MSN, [facility]

## 2014-10-25 NOTE — Progress Notes (Signed)
Pt prepared for d/c to SNF. IV d/c'd. Skin intact except as charted in most recent assessments. Vitals are stable. Report called to receiving facility. Pt to be transported by ambulance service. 

## 2014-10-25 NOTE — Clinical Social Work Placement (Addendum)
   CLINICAL SOCIAL WORK PLACEMENT  NOTE  Date:  10/25/2014  Patient Details  Name: [name] MRN: [id number] Date of Birth: 04-17-1919  Clinical Social Work is seeking post-discharge placement for this patient at the [place] level of care (*CSW will initial, date and re-position this form in  chart as items are completed):  Yes   Patient/family provided with [place] Work Department's list of facilities offering this level of care within the geographic area requested by the patient (or if unable, by the patient's family).  Yes   Patient/family informed of their freedom to choose among providers that offer the needed level of care, that participate in Medicare, Medicaid or managed care program needed by the patient, have an available bed and are willing to accept the patient.  Yes   Patient/family informed of [place]'s ownership interest in [facility] and [facility], as well as of the fact that they are under no obligation to receive care at these facilities.  PASRR submitted to EDS on 10/23/14     PASRR number received on       Existing PASRR number confirmed on 10/23/14     FL2 transmitted to all facilities in geographic area requested by pt/family on 10/23/14     FL2 transmitted to all facilities within larger geographic area on       Patient informed that his/her managed care company has contracts with or will negotiate with certain facilities, including the following:        Yes   Patient/family informed of bed offers received.  Patient chooses bed at [facility] : [facility]     Physician recommends and patient chooses bed at      Patient to be transferred to [facility] on 10/25/14.  Patient to be transferred to facility by Ambulance     Patient family notified on 10/25/14 of transfer.  Name of family member notified:  [name]     PHYSICIAN Please sign FL2, Please sign DNR     Additional Comment:  Per MD patient ready  for DC to [facility]. RN, patient, patient's family, and facility notified of DC. RN given number for report. DC packet on chart. Ambulance transport requested for patient. Auth: [id number] CSW signing off.   _______________________________________________ Liz Beach MSW, [place], [place], [phone]

## 2014-10-25 NOTE — Discharge Summary (Addendum)
Physician Discharge Summary  [name] PYP:[id number] DOB: 17-Jul-1919 DOA: 10/21/2014  PCP: Jani Gravel, MD  Admit date: 10/21/2014 Discharge date: 10/25/2014  Time spent: 20 minutes  Recommendations for Outpatient Follow-up:  1. Follow up with PCP in 1-2 weeks 2. Consider outpatient Urology follow up for bladder training/bladder outlet obstruction 3. Would repeat renal panel in 1-2 weeks 4. Routine foley care  Discharge Diagnoses:  Principal Problem:   Acute respiratory failure with hypoxia Active Problems:   HYPERTENSION, BENIGN ESSENTIAL   Hypothyroidism   Coronary atherosclerosis of native coronary artery   Diabetic neuropathy   HCAP (healthcare-associated pneumonia)   UTI (urinary tract infection)   Leukocytosis   Diabetes mellitus with renal manifestations, uncontrolled   Chronic ulcer of left foot   Other chest pain   Chronic diastolic CHF (congestive heart failure)   Discharge Condition: Improved  Diet recommendation: Diabetic  Filed Weights   10/23/14 0458 10/24/14 0500 10/25/14 0536  Weight: 73.8 kg (162 lb 11.2 oz) 73.9 kg (162 lb 14.7 oz) 70.9 kg (156 lb 4.9 oz)    History of present illness:  Please see admit h and p from 9/26 for details. Briefly, pt presented initially with complaints of chest pains, found to have Cr of 2.2 and WBC fo 17k. The patient was admitted for further work up.  Hospital Course:  1. Acute hypoxic respiratory failure due to HCAP - patient remains on empiric antibiotics, supportive care with oxygen nebulizer treatment as needed. Leukocytosis slightly worsened although pt seems to be clinically improved. Pt remains O2 dependent. Repeat CXR done on 9/29 with worsening airspace disease worrisome for PNA, probably aspiration, with pulm edema. Pt improved with one dose of IV lasix. As CXR with concerns for possible aspiration, patient was continued on zosyn with improvement in WBC. Patient was transitioned to augmentin and monitored with no  obvious ill effects. Patient will complete augmentin on discharge  2. Chronic right foot ulcer and wound with some surrounding cellulitis. Dr. Ninfa Linden orthopedics was consulted and underwent bedside debridement on 9/27. We'll require wound care here and after discharge. Patient to follow up with Dr. Ninfa Linden as scheduled  3. Chest pain upon admission. Currently chest pain-free, EKG was nonacute, troponin mildly elevated with cycle 2 document trend for prognostic purposes, she is a candidate for medical treatment only. On aspirin, beta blocker and Imdur which will be continued. Due to advanced age. Did not repeat echo as it would not change management. Not a candidate for invasive procedures or testing.  4. Suspected acute on chronic diastolic CHF. EF was about 50% in 2014 with grade 2 diastolic dysfunction.  CXR with evidence of pulm edema. Improved with IV lasix. Check daily wts and i/o   5. Essential hypertension she is currently on a combination of Coreg, Imdur and Cardizem which will be continued.   6. ARF on Chronic kidney disease stage IV. Baseline creatinine appears to be close to 1.6 which is near today's Cr.   7. Hypothyroidism. On Synthroid continue.   8. UTI upon admission. On empiric antibiotic monitor cultures.   9. DM type II. Poor control on Lantus, pre-meal NovoLog and sliding scale while inpatient  Procedures:  I/D at bedside 9/27  Consultations:  Orthopedic Surgery  Discharge Exam: Filed Vitals:   10/25/14 0059 10/25/14 0536 10/25/14 0541 10/25/14 1322  BP: 128/35  145/52 97/75  Pulse: 67  65 68  Temp:   98 F (36.7 C) 97.9 F (36.6 C)  TempSrc:   Oral  Oral  Resp:   18 18  Height:      Weight:  70.9 kg (156 lb 4.9 oz)    SpO2:   99% 98%    General: Awake, in nad Cardiovascular: regular, s1, s2 Respiratory: normal resp effort, no wheezing  Discharge Instructions     Medication List    STOP taking these medications        UNABLE TO FIND       TAKE these medications        amoxicillin-clavulanate 500-125 MG tablet  Commonly known as:  AUGMENTIN  Take 1 tablet (500 mg total) by mouth 2 (two) times daily.     aspirin 81 MG tablet  Take 81 mg by mouth daily.     benzonatate 100 MG capsule  Commonly known as:  TESSALON  Take 1 capsule (100 mg total) by mouth 3 (three) times daily as needed for cough.     carvedilol 6.25 MG tablet  Commonly known as:  COREG  Take 6.25 mg by mouth 2 (two) times daily with a meal.     diltiazem 180 MG 24 hr capsule  Commonly known as:  CARDIZEM CD  Take 180 mg by mouth at bedtime.     furosemide 20 MG tablet  Commonly known as:  LASIX  TAKE 1 TABLET BY MOUTH 2 TIMES DAILY.*PLEASE MAKE APPOINTMENT FOR REFILLS*     gabapentin 300 MG capsule  Commonly known as:  NEURONTIN  Take 300 mg by mouth 2 (two) times daily.     insulin aspart 100 UNIT/ML injection  Commonly known as:  novoLOG  <70(0 units), 70-140 (0 units),141-180 (2 units), 181-220(4 units), 221-260 (6 units), 261-300(8 units), 301-340(10 units), >340 (12 units) notify MD     insulin glargine 100 UNIT/ML injection  Commonly known as:  LANTUS  Inject 0.4 mLs (40 Units total) into the skin every morning.     isosorbide mononitrate 30 MG 24 hr tablet  Commonly known as:  IMDUR  Take 30 mg by mouth at bedtime.     levothyroxine 175 MCG tablet  Commonly known as:  SYNTHROID, LEVOTHROID  Take 175 mcg by mouth daily before breakfast.     multivitamin tablet  Take 1 tablet by mouth daily.     mupirocin ointment 2 %  Commonly known as:  BACTROBAN  Apply 1 application topically 2 (two) times daily as needed.     nutrition supplement (JUVEN) Pack  Take 1 packet by mouth 2 (two) times daily with a meal.     tamsulosin 0.4 MG Caps capsule  Commonly known as:  FLOMAX  Take 0.4 mg by mouth every morning.     VITAMIN E PO  Take 1 capsule by mouth daily.       Allergies  Allergen Reactions  . Ephedrine     Unknown.    .  Morphine Other (See Comments)    Difficulty waking up & talking after surgery   Follow-up Information    Follow up with Mcarthur Rossetti, MD. Schedule an appointment as soon as possible for a visit in 2 weeks.   Specialty:  Orthopedic Surgery   Contact information:   [place] [address] [phone]       Follow up with Jani Gravel, MD. Schedule an appointment as soon as possible for a visit in 1 week.   Specialty:  Internal Medicine   Why:  Hospital follow up   Contact information:   [address]  [address] [place] [address] [phone]        The results of significant diagnostics from this hospitalization (including imaging, microbiology, ancillary and laboratory) are listed below for reference.    Significant Diagnostic Studies: Dg Chest 2 View  10/21/2014   CLINICAL DATA:  Substernal chest pain since this morning.  EXAM: CHEST  2 VIEW  COMPARISON:  08/12/2014, 08/08/2014 and 08/27/2013  FINDINGS: Lungs are somewhat hypoinflated with airspace opacification over the left lower lobe/ lingula. No definite effusion. Minimal prominence of the perihilar markings. Mild stable cardiomegaly. There is calcified plaque over the thoracoabdominal aorta. Stable spinal compression fractures.  IMPRESSION: Hypoinflation with airspace consolidation over the left lower lobe/lingula likely a pneumonia.  Mild stable cardiomegaly with suggestion of minimal vascular congestion.  Stable spinal compression fractures for   Electronically Signed   By: Marin Olp M.D.   On: 10/21/2014 13:58   Dg Chest Port 1 View  10/24/2014   CLINICAL DATA:  Congestive heart failure. Healthcare associated pneumonia. Hypertension.  EXAM: PORTABLE CHEST 1 VIEW  COMPARISON:  10/21/2014 chest radiograph.  FINDINGS: Allowing for portable technique, the pulmonary are a shin has worsened compared to the prior study. There is increasing bilateral basilar airspace opacity. This probably  represents a combination of pulmonary edema superimposed on pneumonia or aspiration pneumonitis. The cardiopericardial silhouette is upper limits of normal for projection. Aortic arch atherosclerosis. Probable small LEFT pleural effusion.  IMPRESSION: Worsening airspace disease consistent with a combination of pneumonia or aspiration pneumonitis and superimposed pulmonary edema.   Electronically Signed   By: Dereck Ligas M.D.   On: 10/24/2014 10:00    Microbiology: Recent Results (from the past 240 hour(s))  Blood culture (routine x 2)     Status: None (Preliminary result)   Collection Time: 10/21/14  1:00 PM  Result Value Ref Range Status   Specimen Description BLOOD RIGHT ANTECUBITAL  Final   Special Requests BOTTLES DRAWN AEROBIC AND ANAEROBIC 5CC  Final   Culture NO GROWTH 4 DAYS  Final   Report Status PENDING  Incomplete  Blood culture (routine x 2)     Status: None (Preliminary result)   Collection Time: 10/21/14  2:57 PM  Result Value Ref Range Status   Specimen Description BLOOD RIGHT FOREARM  Final   Special Requests BOTTLES DRAWN AEROBIC AND ANAEROBIC 10ML  Final   Culture NO GROWTH 4 DAYS  Final   Report Status PENDING  Incomplete     Labs: Basic Metabolic Panel:  Recent Labs Lab 10/21/14 1826 10/22/14 0513 10/23/14 0753 10/24/14 0502 10/25/14 0653  NA 135 133* 136 138 139  K 3.8 4.3 3.9 4.1 4.1  CL 102 100* 103 106 104  CO2 23 23 25 24 28   GLUCOSE 328* 422* 141* 152* 141*  BUN 51* 50* 59* 55* 37*  CREATININE 1.85* 1.81* 2.00* 1.73* 1.60*  CALCIUM 8.3* 8.0* 7.9* 8.1* 8.3*  MG 1.6*  --   --   --   --   PHOS 3.0  --   --   --   --    Liver Function Tests:  Recent Labs Lab 10/21/14 1826  AST 15  ALT 12*  ALKPHOS 94  BILITOT 0.6  PROT 7.0  ALBUMIN 2.4*   No results for input(s): LIPASE, AMYLASE in the last 168 hours. No results for input(s): AMMONIA in the last 168 hours. CBC:  Recent Labs Lab 10/21/14 1300 10/22/14 0513 10/23/14 0753  10/24/14 0502 10/25/14 0653  WBC 17.2* 15.1* 13.4*  14.1* 12.3*  HGB 12.1 10.5* 9.3* 9.8* 9.7*  HCT 36.9 32.7* 29.8* 30.8* 31.6*  MCV 83.3 83.4 83.7 83.2 83.4  PLT 338 308 325 349 387   Cardiac Enzymes:  Recent Labs Lab 10/22/14 0513 10/22/14 1317 10/22/14 1818  TROPONINI 0.09* 0.04* 0.03   BNP: BNP (last 3 results) No results for input(s): BNP in the last 8760 hours.  ProBNP (last 3 results) No results for input(s): PROBNP in the last 8760 hours.  CBG:  Recent Labs Lab 10/24/14 1147 10/24/14 1718 10/24/14 2206 10/25/14 0814 10/25/14 1150  GLUCAP 258* 325* 185* 138* 247*     Signed:  CHIU, STEPHEN K  [facility] 10/25/2014, 2:19 PM

## 2014-10-25 NOTE — Evaluation (Signed)
Clinical/Bedside Swallow Evaluation Patient Details  Name: [name] MRN: [id number] Date of Birth: May 20, 1919  Today's Date: 10/25/2014 Time: SLP Start Time (ACUTE ONLY): 0932 SLP Stop Time (ACUTE ONLY): 0955 SLP Time Calculation (min) (ACUTE ONLY): 23 min  Past Medical History:  Past Medical History  Diagnosis Date  . Myocardial infarction   . Coronary artery disease   . Hypertension   . Dysrhythmia   . Peripheral vascular disease   . Hypothyroidism   . Diabetes mellitus   . Presbyesophagus 04/23/2011  . Urinary retention with incomplete bladder emptying 04/23/2011  . UTI (lower urinary tract infection) 04/21/2011  . Diastolic CHF, chronic     Grade I diastolic dysfunction on Echo 04/26/11  . Anaphylaxis 04/25/2011    Thought to be from Rocephin  . History of atrial flutter     ablation  . C. difficile colitis 04/17/2012  . Atrial flutter 11/01/2012  . HCAP (healthcare-associated pneumonia) 10/21/2014  . Stroke 2003    "lost the use of all of my right side"   Past Surgical History:  Past Surgical History  Procedure Laterality Date  . Cholecystectomy    . Colonoscopy  01/15/2011    Procedure: COLONOSCOPY;  Surgeon: Beryle Beams, MD;  Location: [facility];  Service: Endoscopy;  Laterality: N/A;  . Amputation Right 04/14/2012    Procedure: AMPUTATION RAY;  Surgeon: Newt Minion, MD;  Location: [facility];  Service: Orthopedics;  Laterality: Right;  ankle block  . Cardiac electrophysiology mapping and ablation    . Nm myocar perf wall motion  01/23/2009    mild ischemia mid inferior & apical inferior.  EF 75%  . Amputation Right 08/10/2014    Procedure: AMPUTATION RAY;  Surgeon: Mcarthur Rossetti, MD;  Location: [facility];  Service: Orthopedics;  Laterality: Right;  ankle block   HPI:  81 year old female with past medical history of hypertension, diabetes, right foot osteomyelitis s/p first ray amputation, now with left foot wound and was scheduled for left foot wound  irrigation and debridement on 10/22/2014 with Dr. Kathrynn Speed. She presented to [facility] ED with substernal chest pain, non radiating, sharp, intermittent, 5/10 in intensity, at rest. No specific aggravating factors. Her pain has improved in ED. No palpitations, no shortness of breath. No fevers. No cough. No abdominal pain, nausea or vomiting. No diarrhea or constipation.    Assessment / Plan / Recommendation Clinical Impression   Pt with no overt s/s of aspiration noted with regular/thin via straw consistencies; skilled observation during breakfast tray and ill-fitting dentures affected slow but thorough mastication; pt also stated she "has had difficulty with vomiting intermittently when eating in the past." PMHx indicated presbyesophagus which could influence during meal consumption, but this was not observed during breakfast meal.  CXR revealed on 10/24/14 Worsening airspace disease consistent with a combination of pneumonia or aspiration pneumonitis and superimposed pulmonary edema; ST to f/u x1 for diet tolerance and pt/family education for esophageal precautions.    Aspiration Risk  Mild    Diet Recommendation Thin;Other (Comment) (Regular (carb modified))   Medication Administration: Whole meds with liquid Compensations: Slow rate;Small sips/bites    Other  Recommendations Oral Care Recommendations: Oral care BID Other Recommendations: Other (Comment) (Esophageal precautions)   Follow Up Recommendations       Frequency and Duration min 1 x/week  1 week   Pertinent Vitals/Pain Elevated BP intermittently; afebrile    SLP Swallow Goals  See POC   Swallow Study Prior Functional  Status       General Date of Onset: 10/21/14 Other Pertinent Information: 81 year old female with past medical history of hypertension, diabetes, right foot osteomyelitis s/p first ray amputation, now with left foot wound and was scheduled for left foot wound irrigation and debridement on 10/22/2014 with Dr. Kathrynn Speed. She presented to [facility] ED with substernal chest pain, non radiating, sharp, intermittent, 5/10 in intensity, at rest. No specific aggravating factors. Her pain has improved in ED. No palpitations, no shortness of breath. No fevers. No cough. No abdominal pain, nausea or vomiting. No diarrhea or constipation.  Type of Study: Bedside swallow evaluation Diet Prior to this Study: Regular;Thin liquids (Carb modified) Temperature Spikes Noted: No Respiratory Status: Supplemental O2 delivered via (comment) ([place]) History of Recent Intubation: No Behavior/Cognition: Alert;Cooperative Oral Cavity - Dentition: Adequate natural dentition/normal for age (Full set dentures; ill-fitting) Self-Feeding Abilities: Able to feed self Patient Positioning: Upright in bed Baseline Vocal Quality: Low vocal intensity Volitional Cough: Strong Volitional Swallow: Able to elicit    Oral/Motor/Sensory Function Overall Oral Motor/Sensory Function: Appears within functional limits for tasks assessed   Ice Chips Ice chips: Not tested   Thin Liquid Thin Liquid: Within functional limits Presentation: Cup;Straw    Nectar Thick Nectar Thick Liquid: Not tested   Honey Thick Honey Thick Liquid: Not tested   Puree Puree: Within functional limits   Solid       Solid: Within functional limits Presentation: Self Fed Other Comments:  (slow but thorough d/t ill-fitting dentures)       ADAMS,PAT, M.S., CCC-SLP 10/25/2014,10:01 AM

## 2014-10-26 LAB — CULTURE, BLOOD (ROUTINE X 2)
CULTURE: NO GROWTH
CULTURE: NO GROWTH

## 2014-10-26 LAB — LEGIONELLA PNEUMOPHILA SEROGP 1 UR AG: L. PNEUMOPHILA SEROGP 1 UR AG: NEGATIVE

## 2014-10-28 ENCOUNTER — Non-Acute Institutional Stay (SKILLED_NURSING_FACILITY): Payer: PPO | Admitting: Adult Health

## 2014-10-28 ENCOUNTER — Encounter: Payer: Self-pay | Admitting: Adult Health

## 2014-10-28 DIAGNOSIS — G629 Polyneuropathy, unspecified: Secondary | ICD-10-CM

## 2014-10-28 DIAGNOSIS — I5032 Chronic diastolic (congestive) heart failure: Secondary | ICD-10-CM

## 2014-10-28 DIAGNOSIS — E1140 Type 2 diabetes mellitus with diabetic neuropathy, unspecified: Secondary | ICD-10-CM | POA: Diagnosis not present

## 2014-10-28 DIAGNOSIS — I10 Essential (primary) hypertension: Secondary | ICD-10-CM | POA: Diagnosis not present

## 2014-10-28 DIAGNOSIS — L97519 Non-pressure chronic ulcer of other part of right foot with unspecified severity: Secondary | ICD-10-CM

## 2014-10-28 DIAGNOSIS — E039 Hypothyroidism, unspecified: Secondary | ICD-10-CM

## 2014-10-28 DIAGNOSIS — E1165 Type 2 diabetes mellitus with hyperglycemia: Secondary | ICD-10-CM

## 2014-10-28 DIAGNOSIS — N390 Urinary tract infection, site not specified: Secondary | ICD-10-CM

## 2014-10-28 DIAGNOSIS — R5381 Other malaise: Secondary | ICD-10-CM | POA: Diagnosis not present

## 2014-10-28 DIAGNOSIS — R339 Retention of urine, unspecified: Secondary | ICD-10-CM

## 2014-10-28 DIAGNOSIS — N184 Chronic kidney disease, stage 4 (severe): Secondary | ICD-10-CM

## 2014-10-28 DIAGNOSIS — E46 Unspecified protein-calorie malnutrition: Secondary | ICD-10-CM | POA: Diagnosis not present

## 2014-10-28 DIAGNOSIS — IMO0002 Reserved for concepts with insufficient information to code with codable children: Secondary | ICD-10-CM

## 2014-10-28 DIAGNOSIS — I2510 Atherosclerotic heart disease of native coronary artery without angina pectoris: Secondary | ICD-10-CM | POA: Diagnosis not present

## 2014-10-28 DIAGNOSIS — J189 Pneumonia, unspecified organism: Secondary | ICD-10-CM

## 2014-10-28 NOTE — Progress Notes (Signed)
Patient ID: [name], female   DOB: [birth year], 81 y.o.   MRN: [id number]    DATE:  10/28/2014 MRN:  [id number]  BIRTHDAY: 27-Aug-1919  Facility:  [facility]ursing Home Location:  [place] Room Number: [phone]  LEVEL OF CARE:  SNF (31)  Contact Information    Name Relation Home Work [place] Daughter [phone]        Chief Complaint  Patient presents with  . Hospitalization Follow-up    Physical deconditioning, chronic right foot ulcer S/P debridement, CAD, chronic diastolic CHF, protein calorie malnutrition, hypertension, /chronic kidney disease stage IV, hypothyroidism,  diabetes mellitus, neuropathy and urinary retention     HISTORY OF PRESENT ILLNESS:  This is a 81 year old female who was been admitted to [facility] on 10/25/14 from [facility]. She has PMH of hypertension, diabetes mellitus with right foot osteomyelitis S/P ray amputation.  She went to the ED due to chest pain. She was diagnosed to have HCAP. She was given Zosyn and transitioned to Augmentin upon discharge.  She has been admitted for a short-term rehabilitation.   PAST MEDICAL HISTORY:  Past Medical History  Diagnosis Date  . Myocardial infarction ([place])   . Coronary artery disease   . Hypertension   . Dysrhythmia   . Peripheral vascular disease ([place])   . Hypothyroidism   . Diabetes mellitus   . Presbyesophagus 04/23/2011  . Urinary retention with incomplete bladder emptying 04/23/2011  . UTI (lower urinary tract infection) 04/21/2011  . Diastolic CHF, chronic ([facility])     Grade I diastolic dysfunction on Echo 04/26/11  . Anaphylaxis 04/25/2011    Thought to be from Rocephin  . History of atrial flutter     ablation  . C. difficile colitis 04/17/2012  . Atrial flutter ([place]) 11/01/2012  . HCAP (healthcare-associated pneumonia) 10/21/2014  . Stroke [facility]) 2003    "lost the use of all of my right side"     CURRENT MEDICATIONS: Reviewed  Patient's  Medications  New Prescriptions   No medications on file  Previous Medications   AMOXICILLIN-CLAVULANATE (AUGMENTIN) 500-125 MG TABLET    Take 1 tablet (500 mg total) by mouth 2 (two) times daily.   ASPIRIN 81 MG TABLET    Take 81 mg by mouth daily.   BENZONATATE (TESSALON) 100 MG CAPSULE    Take 1 capsule (100 mg total) by mouth 3 (three) times daily as needed for cough.   CARVEDILOL (COREG) 6.25 MG TABLET    Take 6.25 mg by mouth 2 (two) times daily with a meal.     DILTIAZEM (CARDIZEM CD) 180 MG 24 HR CAPSULE    Take 180 mg by mouth at bedtime.    FUROSEMIDE (LASIX) 20 MG TABLET    TAKE 1 TABLET BY MOUTH 2 TIMES DAILY.*PLEASE MAKE APPOINTMENT FOR REFILLS*   GABAPENTIN (NEURONTIN) 300 MG CAPSULE    Take 300 mg by mouth 2 (two) times daily.   INSULIN ASPART (NOVOLOG) 100 UNIT/ML INJECTION    Inject 2-12 Units into the skin 3 (three) times daily before meals. <70(0 units), 70-140 (0 units),141-180 (2 units), 181-220(4 units), 221-260 (6 units), 261-300(8 units), 301-340(10 units), >340 (12 units) notify MD   INSULIN GLARGINE (LANTUS) 100 UNIT/ML INJECTION    Inject 0.4 mLs (40 Units total) into the skin every morning.   ISOSORBIDE MONONITRATE (IMDUR) 30 MG 24 HR TABLET    Take 30 mg by mouth at bedtime.    LEVOTHYROXINE (  SYNTHROID, LEVOTHROID) 175 MCG TABLET    Take 175 mcg by mouth daily before breakfast.   MULTIPLE VITAMIN (MULTIVITAMIN) TABLET    Take 1 tablet by mouth daily.   MUPIROCIN OINTMENT (BACTROBAN) 2 %    Apply 1 application topically 2 (two) times daily as needed.   NUTRITION SUPPLEMENT, JUVEN, (JUVEN) PACK    Take 1 packet by mouth 2 (two) times daily with a meal.   TAMSULOSIN HCL (FLOMAX) 0.4 MG CAPS    Take 0.4 mg by mouth every morning.    VITAMIN E PO    Take 1 capsule by mouth daily.  Modified Medications   No medications on file  Discontinued Medications   No medications on file     Allergies  Allergen Reactions  . Ephedrine     Unknown.    . Morphine Other (See  Comments)    Difficulty waking up & talking after surgery     REVIEW OF SYSTEMS:  GENERAL: no change in appetite, no fatigue, no weight changes, no fever, chills or weakness EYES: Denies change in vision, dry eyes, eye pain, itching or discharge EARS: Denies change in hearing, ringing in ears, or earache NOSE: Denies nasal congestion or epistaxis MOUTH and THROAT: Denies oral discomfort, gingival pain or bleeding, pain from teeth or hoarseness   RESPIRATORY: no cough, SOB, DOE, wheezing, hemoptysis CARDIAC: no chest pain, edema or palpitations GI: no abdominal pain, diarrhea, constipation, heart burn, nausea or vomiting GU: Denies dysuria, frequency, hematuria, incontinence, or discharge PSYCHIATRIC: Denies feeling of depression or anxiety. No report of hallucinations, insomnia, paranoia, or agitation   PHYSICAL EXAMINATION  GENERAL APPEARANCE: Well nourished. In no acute distress. Normal body habitus SKIN:  Right foot has dressing, dry HEAD: Normal in size and contour. No evidence of trauma EYES: Lids open and close normally. No blepharitis, entropion or ectropion. PERRL. Conjunctivae are clear and sclerae are white. Lenses are without opacity EARS: Pinnae are normal. Patient hears normal voice tunes of the examiner MOUTH and THROAT: Lips are without lesions. Oral mucosa is moist and without lesions. Tongue is normal in shape, size, and color and without lesions NECK: supple, trachea midline, no neck masses, no thyroid tenderness, no thyromegaly LYMPHATICS: no LAN in the neck, no supraclavicular LAN RESPIRATORY: breathing is even & unlabored, BS CTAB CARDIAC: RRR, no murmur,no extra heart sounds, no edema GI: abdomen soft, normal BS, no masses, no tenderness, no hepatomegaly, no splenomegaly EXTREMITIES:  Able to move 4 extremities PSYCHIATRIC: Alert and oriented X 3. Affect and behavior are appropriate  LABS/RADIOLOGY: Labs reviewed: Basic Metabolic Panel:  Recent Labs   11/18/13 0810  10/21/14 1826  10/23/14 0753 10/24/14 0502 10/25/14 0653  NA 135*  < > 135  < > 136 138 139  K 4.9  < > 3.8  < > 3.9 4.1 4.1  CL 95*  < > 102  < > 103 106 104  CO2 27  < > 23  < > 25 24 28   GLUCOSE 228*  < > 328*  < > 141* 152* 141*  BUN 27*  < > 51*  < > 59* 55* 37*  CREATININE 1.27*  < > 1.85*  < > 2.00* 1.73* 1.60*  CALCIUM 9.7  < > 8.3*  < > 7.9* 8.1* 8.3*  MG 1.6  --  1.6*  --   --   --   --   PHOS  --   --  3.0  --   --   --   --   < > =  values in this interval not displayed. Liver Function Tests:  Recent Labs  08/08/14 1411 08/13/14 0412 10/21/14 1826  AST 10* 12* 15  ALT 8* 11* 12*  ALKPHOS 73 56 94  BILITOT 0.1* 0.4 0.6  PROT 7.4 6.8 7.0  ALBUMIN 2.8* 2.4* 2.4*   CBC:  Recent Labs  11/18/13 0810  08/08/14 1411  10/23/14 0753 10/24/14 0502 10/25/14 0653  WBC 9.2  < > 11.5*  < > 13.4* 14.1* 12.3*  NEUTROABS 6.6  --  8.3*  --   --   --   --   HGB 12.8  < > 10.3*  < > 9.3* 9.8* 9.7*  HCT 39.3  < > 31.8*  < > 29.8* 30.8* 31.6*  MCV 84.3  < > 81.3  < > 83.7 83.2 83.4  PLT 311  < > 386  < > 325 349 387  < > = values in this interval not displayed.  Cardiac Enzymes:  Recent Labs  10/22/14 0513 10/22/14 1317 10/22/14 1818  TROPONINI 0.09* 0.04* 0.03   CBG:  Recent Labs  10/24/14 2206 10/25/14 0814 10/25/14 1150  GLUCAP 185* 138* 247*     Dg Chest 2 View  10/21/2014   CLINICAL DATA:  Substernal chest pain since this morning.  EXAM: CHEST  2 VIEW  COMPARISON:  08/12/2014, 08/08/2014 and 08/27/2013  FINDINGS: Lungs are somewhat hypoinflated with airspace opacification over the left lower lobe/ lingula. No definite effusion. Minimal prominence of the perihilar markings. Mild stable cardiomegaly. There is calcified plaque over the thoracoabdominal aorta. Stable spinal compression fractures.  IMPRESSION: Hypoinflation with airspace consolidation over the left lower lobe/lingula likely a pneumonia.  Mild stable cardiomegaly with suggestion  of minimal vascular congestion.  Stable spinal compression fractures for   Electronically Signed   By: Marin Olp M.D.   On: 10/21/2014 13:58   Dg Chest Port 1 View  10/24/2014   CLINICAL DATA:  Congestive heart failure. Healthcare associated pneumonia. Hypertension.  EXAM: PORTABLE CHEST 1 VIEW  COMPARISON:  10/21/2014 chest radiograph.  FINDINGS: Allowing for portable technique, the pulmonary are a shin has worsened compared to the prior study. There is increasing bilateral basilar airspace opacity. This probably represents a combination of pulmonary edema superimposed on pneumonia or aspiration pneumonitis. The cardiopericardial silhouette is upper limits of normal for projection. Aortic arch atherosclerosis. Probable small LEFT pleural effusion.  IMPRESSION: Worsening airspace disease consistent with a combination of pneumonia or aspiration pneumonitis and superimposed pulmonary edema.   Electronically Signed   By: Dereck Ligas M.D.   On: 10/24/2014 10:00    ASSESSMENT/PLAN:  Physical deconditioning - for rehabilitation  HCAP - continue Augmentin 500-125 mg 1 tab by mouth twice a day X 5 days and Tessalon 100 mg 1 capsule by mouth 3 times a day when necessary; check CBC  Chronic right foot ulcer -  S/P bedside debridement 9/27; follow-up with Dr. Ninfa Linden in 2 weeks  CAD - troponin mildly elevated; candidate for medical treatment only; continue aspirin 81 mg by mouth daily, Coreg 6.25 mg 1 tab by mouth twice a day and Imdur 30 mg 24 hour 1 tab by mouth daily  Chronic diastolic CHF - daily weights; continue Lasix 20 mg by mouth twice a day  Protein calorie malnutrition, severe - albumin 2.4; RD consult  Hypertension - continue Coreg 6.25 mg by mouth twice a day, Imdur 30 mg 24 hour 1 tab by mouth daily and Cardizem 180 mg CD 1 capsule by mouth daily  at bedtime  Chronic kidney disease is stage IV - creatinine 1.73; baseline creatinine 1.6; check BMP  Hypothyroidism - continue  Synthroid 175 g by mouth daily  Diabetes Mellitus, type 2 - continue Lantus 40 units subcutaneous every morning and NovoLog sliding scale 3 times a day before meals  Neuropathy - continue Neurontin 300 mg 1 capsule by mouth twice a day  Urinary retention - continue Flomax 0.4 mg 1 capsule by mouth every morning     Goals of care:  Short-term rehabilitation    [facility], [facility] [phone]

## 2014-10-31 ENCOUNTER — Non-Acute Institutional Stay (SKILLED_NURSING_FACILITY): Payer: PPO | Admitting: Internal Medicine

## 2014-10-31 DIAGNOSIS — T814XXA Infection following a procedure, initial encounter: Secondary | ICD-10-CM

## 2014-10-31 DIAGNOSIS — I5032 Chronic diastolic (congestive) heart failure: Secondary | ICD-10-CM | POA: Diagnosis not present

## 2014-10-31 DIAGNOSIS — R339 Retention of urine, unspecified: Secondary | ICD-10-CM | POA: Diagnosis not present

## 2014-10-31 DIAGNOSIS — E46 Unspecified protein-calorie malnutrition: Secondary | ICD-10-CM

## 2014-10-31 DIAGNOSIS — R5381 Other malaise: Secondary | ICD-10-CM

## 2014-10-31 DIAGNOSIS — I10 Essential (primary) hypertension: Secondary | ICD-10-CM

## 2014-10-31 DIAGNOSIS — I2510 Atherosclerotic heart disease of native coronary artery without angina pectoris: Secondary | ICD-10-CM | POA: Diagnosis not present

## 2014-10-31 DIAGNOSIS — Z794 Long term (current) use of insulin: Secondary | ICD-10-CM

## 2014-10-31 DIAGNOSIS — S91301D Unspecified open wound, right foot, subsequent encounter: Secondary | ICD-10-CM

## 2014-10-31 DIAGNOSIS — J189 Pneumonia, unspecified organism: Secondary | ICD-10-CM

## 2014-10-31 DIAGNOSIS — IMO0001 Reserved for inherently not codable concepts without codable children: Secondary | ICD-10-CM

## 2014-10-31 DIAGNOSIS — N184 Chronic kidney disease, stage 4 (severe): Secondary | ICD-10-CM

## 2014-10-31 DIAGNOSIS — E1142 Type 2 diabetes mellitus with diabetic polyneuropathy: Secondary | ICD-10-CM | POA: Diagnosis not present

## 2014-10-31 DIAGNOSIS — E039 Hypothyroidism, unspecified: Secondary | ICD-10-CM

## 2014-10-31 NOTE — Progress Notes (Signed)
Patient ID: [name], female   DOB: March 20, 1919, 81 y.o.   MRN: [id number]      [facility]   PCP: Jani Gravel, MD  Code Status: DNR  Allergies  Allergen Reactions  . Ephedrine     Unknown.    . Morphine Other (See Comments)    Difficulty waking up & talking after surgery    Chief Complaint  Patient presents with  . New Admit To SNF     HPI:  81 y.o. patient is here for short term rehabilitation post hospital admission from 10/21/14-10/25/14 with acute hypoxic respiratory failure from HCAP, chf exacerbation, acute renal failure and UTI. She also has chronic right foot ulcer and was treated this admission for cellulitis and bedside debridement on 10/22/14. She was treated with antibiotics, iv diuresis. She has PMH of hypertension, diabetes mellitus with right foot osteomyelitis S/P ray amputation, CHF, CKD stage 4 among others. She is seen in her room today. She has been working with therapy team. She denies any concerns at present.   Review of Systems:  Constitutional: Negative for fever, chills, diaphoresis.  HENT: Negative for headache, congestion, nasal discharge Eyes: Negative for eye pain, blurred vision, double vision and discharge.  Respiratory: Negative for cough, shortness of breath and wheezing.   Cardiovascular: Negative for chest pain, palpitations, leg swelling.  Gastrointestinal: Negative for heartburn, nausea, vomiting, abdominal pain Genitourinary: Negative for dysuria Musculoskeletal: Negative for back pain Skin: Negative for itching, rash.  Neurological: Negative for dizziness, tingling, focal weakness Psychiatric/Behavioral: Negative for depression    Past Medical History  Diagnosis Date  . Myocardial infarction ([place])   . Coronary artery disease   . Hypertension   . Dysrhythmia   . Peripheral vascular disease ([place])   . Hypothyroidism   . Diabetes mellitus   . Presbyesophagus 04/23/2011  . Urinary retention with  incomplete bladder emptying 04/23/2011  . UTI (lower urinary tract infection) 04/21/2011  . Diastolic CHF, chronic (HCC)     Grade I diastolic dysfunction on Echo 04/26/11  . Anaphylaxis 04/25/2011    Thought to be from Rocephin  . History of atrial flutter     ablation  . C. difficile colitis 04/17/2012  . Atrial flutter ([place]) 11/01/2012  . HCAP (healthcare-associated pneumonia) 10/21/2014  . Stroke [facility]) 2003    "lost the use of all of my right side"   Past Surgical History  Procedure Laterality Date  . Cholecystectomy    . Colonoscopy  01/15/2011    Procedure: COLONOSCOPY;  Surgeon: Beryle Beams, MD;  Location: [facility];  Service: Endoscopy;  Laterality: N/A;  . Amputation Right 04/14/2012    Procedure: AMPUTATION RAY;  Surgeon: Newt Minion, MD;  Location: [facility];  Service: Orthopedics;  Laterality: Right;  ankle block  . Cardiac electrophysiology mapping and ablation    . Nm myocar perf wall motion  01/23/2009    mild ischemia mid inferior & apical inferior.  EF 75%  . Amputation Right 08/10/2014    Procedure: AMPUTATION RAY;  Surgeon: Mcarthur Rossetti, MD;  Location: [facility];  Service: Orthopedics;  Laterality: Right;  ankle block   Social History:   reports that she quit smoking about 33 years ago. Her smoking use included Cigarettes. She has a 4 pack-year smoking history. She has never used smokeless tobacco. She reports that she drinks alcohol. She reports that she does not use illicit drugs.  Family History  Problem Relation Age of Onset  .  Cancer Sister   . Diabetes Mother   . Heart disease Father     Medications:   Medication List       This list is accurate as of: 10/31/14  4:13 PM.  Always use your most recent med list.               amoxicillin-clavulanate 500-125 MG tablet  Commonly known as:  AUGMENTIN  Take 1 tablet (500 mg total) by mouth 2 (two) times daily.     aspirin 81 MG tablet  Take 81 mg by mouth daily.     benzonatate 100 MG capsule    Commonly known as:  TESSALON  Take 1 capsule (100 mg total) by mouth 3 (three) times daily as needed for cough.     carvedilol 6.25 MG tablet  Commonly known as:  COREG  Take 6.25 mg by mouth 2 (two) times daily with a meal.     diltiazem 180 MG 24 hr capsule  Commonly known as:  CARDIZEM CD  Take 180 mg by mouth at bedtime.     furosemide 20 MG tablet  Commonly known as:  LASIX  TAKE 1 TABLET BY MOUTH 2 TIMES DAILY.*PLEASE MAKE APPOINTMENT FOR REFILLS*     gabapentin 300 MG capsule  Commonly known as:  NEURONTIN  Take 300 mg by mouth 2 (two) times daily.     insulin aspart 100 UNIT/ML injection  Commonly known as:  novoLOG  Inject 2-12 Units into the skin 3 (three) times daily before meals. <70(0 units), 70-140 (0 units),141-180 (2 units), 181-220(4 units), 221-260 (6 units), 261-300(8 units), 301-340(10 units), >340 (12 units) notify MD     insulin glargine 100 UNIT/ML injection  Commonly known as:  LANTUS  Inject 0.4 mLs (40 Units total) into the skin every morning.     isosorbide mononitrate 30 MG 24 hr tablet  Commonly known as:  IMDUR  Take 30 mg by mouth at bedtime.     levothyroxine 175 MCG tablet  Commonly known as:  SYNTHROID, LEVOTHROID  Take 175 mcg by mouth daily before breakfast.     multivitamin tablet  Take 1 tablet by mouth daily.     mupirocin ointment 2 %  Commonly known as:  BACTROBAN  Apply 1 application topically 2 (two) times daily as needed.     nutrition supplement (JUVEN) Pack  Take 1 packet by mouth 2 (two) times daily with a meal.     tamsulosin 0.4 MG Caps capsule  Commonly known as:  FLOMAX  Take 0.4 mg by mouth every morning.     VITAMIN E PO  Take 1 capsule by mouth daily.         Physical Exam: Filed Vitals:   10/31/14 1613  BP: 112/66  Pulse: 76  Temp: 98.2 F (36.8 C)  Resp: 18  SpO2: 96%    General- elderly female, in no acute distress Head- normocephalic, atraumatic Nose- normal nasal mucosa, no maxillary or  frontal sinus tenderness, no nasal discharge Throat- moist mucus membrane  Eyes- PERRLA, EOMI, no pallor, no icterus, no discharge, normal conjunctiva, normal sclera Neck- no cervical lymphadenopathy Cardiovascular- normal s1,s2, no murmurs, trace leg edema Respiratory- bilateral clear to auscultation, no wheeze, no rhonchi, no crackles, no use of accessory muscles Abdomen- bowel sounds present, soft, non tender Musculoskeletal- able to move all 4 extremities, generalized weakness  Neurological- no focal deficit, alert and oriented to person, place and time Skin- warm and dry, sutures in right foot with  erythema and minimal drainage, tender to palpation, right big toe amputated Psychiatry- normal mood and affect    Labs reviewed: Basic Metabolic Panel:  Recent Labs  11/18/13 0810  10/21/14 1826  10/23/14 0753 10/24/14 0502 10/25/14 0653  NA 135*  < > 135  < > 136 138 139  K 4.9  < > 3.8  < > 3.9 4.1 4.1  CL 95*  < > 102  < > 103 106 104  CO2 27  < > 23  < > 25 24 28   GLUCOSE 228*  < > 328*  < > 141* 152* 141*  BUN 27*  < > 51*  < > 59* 55* 37*  CREATININE 1.27*  < > 1.85*  < > 2.00* 1.73* 1.60*  CALCIUM 9.7  < > 8.3*  < > 7.9* 8.1* 8.3*  MG 1.6  --  1.6*  --   --   --   --   PHOS  --   --  3.0  --   --   --   --   < > = values in this interval not displayed. Liver Function Tests:  Recent Labs  08/08/14 1411 08/13/14 0412 10/21/14 1826  AST 10* 12* 15  ALT 8* 11* 12*  ALKPHOS 73 56 94  BILITOT 0.1* 0.4 0.6  PROT 7.4 6.8 7.0  ALBUMIN 2.8* 2.4* 2.4*   No results for input(s): LIPASE, AMYLASE in the last 8760 hours. No results for input(s): AMMONIA in the last 8760 hours. CBC:  Recent Labs  11/18/13 0810  08/08/14 1411  10/23/14 0753 10/24/14 0502 10/25/14 0653  WBC 9.2  < > 11.5*  < > 13.4* 14.1* 12.3*  NEUTROABS 6.6  --  8.3*  --   --   --   --   HGB 12.8  < > 10.3*  < > 9.3* 9.8* 9.7*  HCT 39.3  < > 31.8*  < > 29.8* 30.8* 31.6*  MCV 84.3  < > 81.3  < >  83.7 83.2 83.4  PLT 311  < > 386  < > 325 349 387  < > = values in this interval not displayed. Cardiac Enzymes:  Recent Labs  10/22/14 0513 10/22/14 1317 10/22/14 1818  TROPONINI 0.09* 0.04* 0.03   BNP: Invalid input(s): POCBNP CBG:  Recent Labs  10/24/14 2206 10/25/14 0814 10/25/14 1150  GLUCAP 185* 138* 247*    Radiological Exams: Dg Chest 2 View  10/21/2014   CLINICAL DATA:  Substernal chest pain since this morning.  EXAM: CHEST  2 VIEW  COMPARISON:  08/12/2014, 08/08/2014 and 08/27/2013  FINDINGS: Lungs are somewhat hypoinflated with airspace opacification over the left lower lobe/ lingula. No definite effusion. Minimal prominence of the perihilar markings. Mild stable cardiomegaly. There is calcified plaque over the thoracoabdominal aorta. Stable spinal compression fractures.  IMPRESSION: Hypoinflation with airspace consolidation over the left lower lobe/lingula likely a pneumonia.  Mild stable cardiomegaly with suggestion of minimal vascular congestion.  Stable spinal compression fractures for   Electronically Signed   By: Marin Olp M.D.   On: 10/21/2014 13:58     Assessment/Plan   Physical deconditioning Will have her work with physical therapy and occupational therapy team to help with gait training and muscle strengthening exercises.fall precautions. Skin care. Encourage to be out of bed.   HCAP To complete course of augmentin today. Discontinue augmentin after today's course. Monitor her breathing  Protein calorie malnutrition monitor weight and po intake. Continue procel and fortified food  Right foot wound infection Start doxycycline 100 mg q12h x 1 week, continue wound care, monitor wbc and temp curve  Chronic right foot ulcer  S/P bedside debridement 9/27. Has f/u with Dr. Ninfa Linden in 2 weeks. Start antibiotics as above and to make appointment with Dr Ninfa Linden  Chronic CHF Monitor weight. Continue coreg 6.25 mg bid, imdur 30 mg daily and laisx 20 mg  bid  CAD Remains chest pain free. continue aspirin 81 mg daily, Coreg 6.25 mg bid and Imdur 30 mg daily  Hypertension Monitor BP, continue Coreg 6.25 mg bid and imdur 30 mg daily  Dm type 2 with neuropathy Monitor cbg, continue lantus 40 u daily and SSI novolog. Continue neurontin 300 mg bid for now  Urinary retention Continue flomax for now. Continue foley catheter and foley care  Chronic kidney disease stage IV Monitor BMP  Hypothyroidism continue Synthroid 175 mcg daily   Goals of care: short term rehabilitation   Labs/tests ordered: cbc with diff, cmp 11/01/14  Family/ staff Communication: reviewed care plan with patient and nursing supervisor    Blanchie Serve, MD  [place] [phone] (Monday-Friday 8 am - 5 pm) [phone] (afterhours)

## 2014-11-05 ENCOUNTER — Encounter: Payer: Self-pay | Admitting: Adult Health

## 2014-11-05 ENCOUNTER — Non-Acute Institutional Stay (SKILLED_NURSING_FACILITY): Payer: PPO | Admitting: Adult Health

## 2014-11-05 DIAGNOSIS — E1140 Type 2 diabetes mellitus with diabetic neuropathy, unspecified: Secondary | ICD-10-CM

## 2014-11-05 DIAGNOSIS — L97519 Non-pressure chronic ulcer of other part of right foot with unspecified severity: Secondary | ICD-10-CM

## 2014-11-05 DIAGNOSIS — I10 Essential (primary) hypertension: Secondary | ICD-10-CM

## 2014-11-05 DIAGNOSIS — I2510 Atherosclerotic heart disease of native coronary artery without angina pectoris: Secondary | ICD-10-CM

## 2014-11-05 DIAGNOSIS — E039 Hypothyroidism, unspecified: Secondary | ICD-10-CM

## 2014-11-05 DIAGNOSIS — N184 Chronic kidney disease, stage 4 (severe): Secondary | ICD-10-CM | POA: Diagnosis not present

## 2014-11-05 DIAGNOSIS — L03115 Cellulitis of right lower limb: Secondary | ICD-10-CM

## 2014-11-05 DIAGNOSIS — G629 Polyneuropathy, unspecified: Secondary | ICD-10-CM | POA: Diagnosis not present

## 2014-11-05 DIAGNOSIS — IMO0002 Reserved for concepts with insufficient information to code with codable children: Secondary | ICD-10-CM

## 2014-11-05 DIAGNOSIS — I5032 Chronic diastolic (congestive) heart failure: Secondary | ICD-10-CM

## 2014-11-05 DIAGNOSIS — R5381 Other malaise: Secondary | ICD-10-CM | POA: Diagnosis not present

## 2014-11-05 DIAGNOSIS — R339 Retention of urine, unspecified: Secondary | ICD-10-CM | POA: Diagnosis not present

## 2014-11-05 DIAGNOSIS — E1165 Type 2 diabetes mellitus with hyperglycemia: Secondary | ICD-10-CM

## 2014-11-05 DIAGNOSIS — E46 Unspecified protein-calorie malnutrition: Secondary | ICD-10-CM

## 2014-11-05 NOTE — Progress Notes (Signed)
Patient ID: [name], female   DOB: [birth year], 81 y.o.   MRN: [id number]    DATE:  11/05/2014 MRN:  [id number]  BIRTHDAY: 1919-08-01  Facility:  [facility] Home Location:  [place] Room Number: [phone]  LEVEL OF CARE:  SNF [phone])  Contact Information    Name [place] Daughter [phone]  [phone]      Chief Complaint  Patient presents with  . Discharge Note    Physical deconditioning, Chronic right foot ulcer S/P debridement, Cellulitis of right foot, CAD, chronic diastolic CHF, protein calorie malnutrition, hypertension, /chronic kidney disease stage IV, hypothyroidism,  diabetes mellitus, neuropathy and urinary retention     HISTORY OF PRESENT ILLNESS:  This is a 81 year old female who is for discharge home with Home health PT for endurance, OT for ADLs and CNA for showers. She has been admitted to [facility] on 10/25/14 from [facility]. She has PMH of hypertension, diabetes mellitus with right foot osteomyelitis S/P ray amputation. She went to the ED due to chest pain. She was diagnosed to have HCAP. She was given Zosyn and transitioned to Augmentin upon discharge. She had debridement of right foot wound, as well.  She was recently started on Doxycycline for right foot infection. She will finish the ATB course on day of discharge home.  Patient was admitted to this facility for short-term rehabilitation after the patient's recent hospitalization.  Patient has completed SNF rehabilitation and therapy has cleared the patient for discharge.   PAST MEDICAL HISTORY:  Past Medical History  Diagnosis Date  . Myocardial infarction ([place])   . Coronary artery disease   . Hypertension   . Dysrhythmia   . Peripheral vascular disease ([place])   . Hypothyroidism   . Diabetes mellitus   . Presbyesophagus 04/23/2011  . Urinary retention with incomplete bladder emptying 04/23/2011  . UTI (lower urinary tract  infection) 04/21/2011  . Diastolic CHF, chronic (HCC)     Grade I diastolic dysfunction on Echo 04/26/11  . Anaphylaxis 04/25/2011    Thought to be from Rocephin  . History of atrial flutter     ablation  . C. difficile colitis 04/17/2012  . Atrial flutter ([place]) 11/01/2012  . HCAP (healthcare-associated pneumonia) 10/21/2014  . Stroke [facility]) 2003    "lost the use of all of my right side"     CURRENT MEDICATIONS: Reviewed  Patient's Medications  New Prescriptions   No medications on file  Previous Medications   AMOXICILLIN-CLAVULANATE (AUGMENTIN) 500-125 MG TABLET    Take 1 tablet (500 mg total) by mouth 2 (two) times daily.   ASPIRIN 81 MG TABLET    Take 81 mg by mouth daily.   BENZONATATE (TESSALON) 100 MG CAPSULE    Take 1 capsule (100 mg total) by mouth 3 (three) times daily as needed for cough.   CARVEDILOL (COREG) 6.25 MG TABLET    Take 6.25 mg by mouth 2 (two) times daily with a meal.     DILTIAZEM (CARDIZEM CD) 180 MG 24 HR CAPSULE    Take 180 mg by mouth at bedtime.    FUROSEMIDE (LASIX) 20 MG TABLET    TAKE 1 TABLET BY MOUTH 2 TIMES DAILY.*PLEASE MAKE APPOINTMENT FOR REFILLS*   GABAPENTIN (NEURONTIN) 300 MG CAPSULE    Take 300 mg by mouth 2 (two) times daily.   INSULIN ASPART (NOVOLOG) 100 UNIT/ML INJECTION    Inject 2-12 Units into the skin 3 (  three) times daily before meals. <70(0 units), 70-140 (0 units),141-180 (2 units), 181-220(4 units), 221-260 (6 units), 261-300(8 units), 301-340(10 units), >340 (12 units) notify MD   INSULIN GLARGINE (LANTUS) 100 UNIT/ML INJECTION    Inject 0.4 mLs (40 Units total) into the skin every morning.   ISOSORBIDE MONONITRATE (IMDUR) 30 MG 24 HR TABLET    Take 30 mg by mouth at bedtime.    LEVOTHYROXINE (SYNTHROID, LEVOTHROID) 175 MCG TABLET    Take 175 mcg by mouth daily before breakfast.   MULTIPLE VITAMIN (MULTIVITAMIN) TABLET    Take 1 tablet by mouth daily.   MUPIROCIN OINTMENT (BACTROBAN) 2 %    Apply 1 application topically 2 (two) times  daily as needed.   NUTRITION SUPPLEMENT, JUVEN, (JUVEN) PACK    Take 1 packet by mouth 2 (two) times daily with a meal.   TAMSULOSIN HCL (FLOMAX) 0.4 MG CAPS    Take 0.4 mg by mouth every morning.    VITAMIN E PO    Take 1 capsule by mouth daily.  Modified Medications   No medications on file  Discontinued Medications   No medications on file    Allergies  Allergen Reactions  . Ephedrine     Unknown.    . Morphine Other (See Comments)    Difficulty waking up & talking after surgery     REVIEW OF SYSTEMS:  GENERAL: no change in appetite, no fatigue, no weight changes, no fever, chills or weakness EYES: Denies change in vision, dry eyes, eye pain, itching or discharge EARS: Denies change in hearing, ringing in ears, or earache NOSE: Denies nasal congestion or epistaxis MOUTH and THROAT: Denies oral discomfort, gingival pain or bleeding, pain from teeth or hoarseness   RESPIRATORY: no cough, SOB, DOE, wheezing, hemoptysis CARDIAC: no chest pain, edema or palpitations GI: no abdominal pain, diarrhea, constipation, heart burn, nausea or vomiting GU: Denies dysuria, frequency, hematuria, incontinence, or discharge PSYCHIATRIC: Denies feeling of depression or anxiety. No report of hallucinations, insomnia, paranoia, or agitation   PHYSICAL EXAMINATION  GENERAL APPEARANCE: Well nourished. In no acute distress. Normal body habitus SKIN:  Right great toe amputation site with sutures and scant serosanguinous drainage, no erythema HEAD: Normal in size and contour. No evidence of trauma EYES: Lids open and close normally. No blepharitis, entropion or ectropion. PERRL. Conjunctivae are clear and sclerae are white. Lenses are without opacity EARS: Pinnae are normal. Patient hears normal voice tunes of the examiner MOUTH and THROAT: Lips are without lesions. Oral mucosa is moist and without lesions. Tongue is normal in shape, size, and color and without lesions NECK: supple, trachea midline,  no neck masses, no thyroid tenderness, no thyromegaly LYMPHATICS: no LAN in the neck, no supraclavicular LAN RESPIRATORY: breathing is even & unlabored, BS CTAB CARDIAC: RRR, no murmur,no extra heart sounds, no edema GI: abdomen soft, normal BS, no masses, no tenderness, no hepatomegaly, no splenomegaly EXTREMITIES:  Able to move 4 extremities PSYCHIATRIC: Alert and oriented X 3. Affect and behavior are appropriate  LABS/RADIOLOGY: Labs reviewed: 11/01/14  sodium 137 potassium 4.8 glucose 63 BUN 58 creatinine 1.52 calcium 8.8 albumin 2.96 phosphorous 4.1 Basic Metabolic Panel:  Recent Labs  11/18/13 0810  10/21/14 1826  10/23/14 0753 10/24/14 0502 10/25/14 0653  NA 135*  < > 135  < > 136 138 139  K 4.9  < > 3.8  < > 3.9 4.1 4.1  CL 95*  < > 102  < > 103 106 104  CO2 27  < >  23  < > 25 24 28   GLUCOSE 228*  < > 328*  < > 141* 152* 141*  BUN 27*  < > 51*  < > 59* 55* 37*  CREATININE 1.27*  < > 1.85*  < > 2.00* 1.73* 1.60*  CALCIUM 9.7  < > 8.3*  < > 7.9* 8.1* 8.3*  MG 1.6  --  1.6*  --   --   --   --   PHOS  --   --  3.0  --   --   --   --   < > = values in this interval not displayed. Liver Function Tests:  Recent Labs  08/08/14 1411 08/13/14 0412 10/21/14 1826  AST 10* 12* 15  ALT 8* 11* 12*  ALKPHOS 73 56 94  BILITOT 0.1* 0.4 0.6  PROT 7.4 6.8 7.0  ALBUMIN 2.8* 2.4* 2.4*   CBC:  Recent Labs  11/18/13 0810  08/08/14 1411  10/23/14 0753 10/24/14 0502 10/25/14 0653  WBC 9.2  < > 11.5*  < > 13.4* 14.1* 12.3*  NEUTROABS 6.6  --  8.3*  --   --   --   --   HGB 12.8  < > 10.3*  < > 9.3* 9.8* 9.7*  HCT 39.3  < > 31.8*  < > 29.8* 30.8* 31.6*  MCV 84.3  < > 81.3  < > 83.7 83.2 83.4  PLT 311  < > 386  < > 325 349 387  < > = values in this interval not displayed.  Cardiac Enzymes:  Recent Labs  10/22/14 0513 10/22/14 1317 10/22/14 1818  TROPONINI 0.09* 0.04* 0.03   CBG:  Recent Labs  10/24/14 2206 10/25/14 0814 10/25/14 1150  GLUCAP 185* 138* 247*      Dg Chest 2 View  10/21/2014   CLINICAL DATA:  Substernal chest pain since this morning.  EXAM: CHEST  2 VIEW  COMPARISON:  08/12/2014, 08/08/2014 and 08/27/2013  FINDINGS: Lungs are somewhat hypoinflated with airspace opacification over the left lower lobe/ lingula. No definite effusion. Minimal prominence of the perihilar markings. Mild stable cardiomegaly. There is calcified plaque over the thoracoabdominal aorta. Stable spinal compression fractures.  IMPRESSION: Hypoinflation with airspace consolidation over the left lower lobe/lingula likely a pneumonia.  Mild stable cardiomegaly with suggestion of minimal vascular congestion.  Stable spinal compression fractures for   Electronically Signed   By: Marin Olp M.D.   On: 10/21/2014 13:58   Dg Chest Port 1 View  10/24/2014   CLINICAL DATA:  Congestive heart failure. Healthcare associated pneumonia. Hypertension.  EXAM: PORTABLE CHEST 1 VIEW  COMPARISON:  10/21/2014 chest radiograph.  FINDINGS: Allowing for portable technique, the pulmonary are a shin has worsened compared to the prior study. There is increasing bilateral basilar airspace opacity. This probably represents a combination of pulmonary edema superimposed on pneumonia or aspiration pneumonitis. The cardiopericardial silhouette is upper limits of normal for projection. Aortic arch atherosclerosis. Probable small LEFT pleural effusion.  IMPRESSION: Worsening airspace disease consistent with a combination of pneumonia or aspiration pneumonitis and superimposed pulmonary edema.   Electronically Signed   By: Dereck Ligas M.D.   On: 10/24/2014 10:00    ASSESSMENT/PLAN:  Physical deconditioning - for home health PT, OT and CNA   HCAP - Resolved  Chronic right foot ulcer -  S/P bedside debridement 9/27; follow-up with Dr. Ninfa Linden   Cellulitis of right foot - Doxycycline course will be completed before discharge  CAD - troponin mildly elevated;  candidate for medical treatment only;  continue aspirin 81 mg by mouth daily, Coreg 6.25 mg 1 tab by mouth twice a day and Imdur 30 mg 24 hour 1 tab by mouth daily  Chronic diastolic CHF - continue Lasix 20 mg by mouth twice a day  Protein calorie malnutrition, severe - albumin 2.4; continue supplementation  Hypertension - continue Coreg 6.25 mg by mouth twice a day, Imdur 30 mg 24 hour 1 tab by mouth daily and Cardizem 180 mg CD 1 capsule by mouth daily at bedtime  Chronic kidney disease is stage IV - creatinine 1.52; baseline creatinine 1.6; improved  Hypothyroidism - continue Synthroid 175 g by mouth daily  Diabetes Mellitus, type 2 - continue Lantus 40 units subcutaneous every morning and NovoLog sliding scale 3 times a day before meals  Neuropathy - continue Neurontin 300 mg 1 capsule by mouth twice a day  Urinary retention - continue Flomax 0.4 mg 1 capsule by mouth every morning      I have filled out patient's discharge paperwork and written prescriptions.  Patient will receive home health PT, OT and CNA.  Total discharge time: Less than 30 minutes  Discharge time involved coordination of the discharge process with Education officer, museum, nursing staff and therapy department. Medical justification for home health services verified.    [facility], NP [employer] [phone]

## 2014-11-27 ENCOUNTER — Other Ambulatory Visit (HOSPITAL_COMMUNITY): Payer: Self-pay | Admitting: Orthopaedic Surgery

## 2014-12-02 ENCOUNTER — Encounter (HOSPITAL_COMMUNITY): Payer: Self-pay | Admitting: *Deleted

## 2014-12-02 NOTE — Progress Notes (Addendum)
[name] reports that CBG's run , > 200, "I cant get used to the new medication (Lantus)."  Patient states that Dr Maudie Mercury is aware.  I requested reports from Dr Julianne Rice office.

## 2014-12-03 ENCOUNTER — Ambulatory Visit (HOSPITAL_COMMUNITY): Payer: PPO | Admitting: Anesthesiology

## 2014-12-03 ENCOUNTER — Encounter (HOSPITAL_COMMUNITY): Payer: Self-pay | Admitting: Certified Registered Nurse Anesthetist

## 2014-12-03 ENCOUNTER — Ambulatory Visit (HOSPITAL_COMMUNITY)
Admission: RE | Admit: 2014-12-03 | Discharge: 2014-12-03 | Disposition: A | Discharge status: Home / Self Care | Payer: PPO | Source: Ambulatory Visit | Attending: Orthopaedic Surgery | Admitting: Orthopaedic Surgery

## 2014-12-03 ENCOUNTER — Encounter (HOSPITAL_COMMUNITY)
Admission: RE | Disposition: A | Discharge status: Home / Self Care | Payer: Self-pay | Source: Ambulatory Visit | Attending: Orthopaedic Surgery

## 2014-12-03 DIAGNOSIS — E1140 Type 2 diabetes mellitus with diabetic neuropathy, unspecified: Secondary | ICD-10-CM | POA: Diagnosis not present

## 2014-12-03 DIAGNOSIS — Z794 Long term (current) use of insulin: Secondary | ICD-10-CM | POA: Diagnosis not present

## 2014-12-03 DIAGNOSIS — I2510 Atherosclerotic heart disease of native coronary artery without angina pectoris: Secondary | ICD-10-CM | POA: Diagnosis not present

## 2014-12-03 DIAGNOSIS — E1151 Type 2 diabetes mellitus with diabetic peripheral angiopathy without gangrene: Secondary | ICD-10-CM | POA: Diagnosis not present

## 2014-12-03 DIAGNOSIS — I5032 Chronic diastolic (congestive) heart failure: Secondary | ICD-10-CM | POA: Diagnosis not present

## 2014-12-03 DIAGNOSIS — I252 Old myocardial infarction: Secondary | ICD-10-CM | POA: Diagnosis not present

## 2014-12-03 DIAGNOSIS — Z833 Family history of diabetes mellitus: Secondary | ICD-10-CM | POA: Diagnosis not present

## 2014-12-03 DIAGNOSIS — Z87891 Personal history of nicotine dependence: Secondary | ICD-10-CM | POA: Diagnosis not present

## 2014-12-03 DIAGNOSIS — I10 Essential (primary) hypertension: Secondary | ICD-10-CM | POA: Insufficient documentation

## 2014-12-03 DIAGNOSIS — S91309A Unspecified open wound, unspecified foot, initial encounter: Secondary | ICD-10-CM

## 2014-12-03 DIAGNOSIS — Z8673 Personal history of transient ischemic attack (TIA), and cerebral infarction without residual deficits: Secondary | ICD-10-CM | POA: Insufficient documentation

## 2014-12-03 DIAGNOSIS — S91301D Unspecified open wound, right foot, subsequent encounter: Secondary | ICD-10-CM

## 2014-12-03 DIAGNOSIS — Z89431 Acquired absence of right foot: Secondary | ICD-10-CM | POA: Insufficient documentation

## 2014-12-03 DIAGNOSIS — E11621 Type 2 diabetes mellitus with foot ulcer: Secondary | ICD-10-CM | POA: Insufficient documentation

## 2014-12-03 DIAGNOSIS — E039 Hypothyroidism, unspecified: Secondary | ICD-10-CM | POA: Insufficient documentation

## 2014-12-03 HISTORY — PX: I&D EXTREMITY: SHX5045

## 2014-12-03 HISTORY — DX: Unspecified hearing loss, unspecified ear: H91.90

## 2014-12-03 LAB — GLUCOSE, CAPILLARY
GLUCOSE-CAPILLARY: 161 mg/dL — AB (ref 65–99)
GLUCOSE-CAPILLARY: 133 mg/dL — AB (ref 65–99)

## 2014-12-03 LAB — COMPREHENSIVE METABOLIC PANEL
ALT: 12 U/L — ABNORMAL LOW (ref 14–54)
AST: 19 U/L (ref 15–41)
Albumin: 3.2 g/dL — ABNORMAL LOW (ref 3.5–5.0)
Alkaline Phosphatase: 67 U/L (ref 38–126)
Anion gap: 10 (ref 5–15)
BILIRUBIN TOTAL: 0.5 mg/dL (ref 0.3–1.2)
BUN: 48 mg/dL — AB (ref 6–20)
CHLORIDE: 99 mmol/L — AB (ref 101–111)
CO2: 25 mmol/L (ref 22–32)
CREATININE: 1.65 mg/dL — AB (ref 0.44–1.00)
Calcium: 8.9 mg/dL (ref 8.9–10.3)
GFR, EST AFRICAN AMERICAN: 30 mL/min — AB (ref >60)
GFR, EST NON AFRICAN AMERICAN: 26 mL/min — AB (ref >60)
Glucose, Bld: 163 mg/dL — ABNORMAL HIGH (ref 65–99)
POTASSIUM: 5.1 mmol/L (ref 3.5–5.1)
Sodium: 134 mmol/L — ABNORMAL LOW (ref 135–145)
TOTAL PROTEIN: 7.2 g/dL (ref 6.5–8.1)

## 2014-12-03 LAB — CBC
HEMATOCRIT: 35.8 % — AB (ref 36.0–46.0)
Hemoglobin: 11.3 g/dL — ABNORMAL LOW (ref 12.0–15.0)
MCH: 26.7 pg (ref 26.0–34.0)
MCHC: 31.6 g/dL (ref 30.0–36.0)
MCV: 84.4 fL (ref 78.0–100.0)
PLATELETS: 398 10*3/uL (ref 150–400)
RBC: 4.24 MIL/uL (ref 3.87–5.11)
RDW: 15.2 % (ref 11.5–15.5)
WBC: 9.8 10*3/uL (ref 4.0–10.5)

## 2014-12-03 LAB — TSH: TSH: 10.224 u[IU]/mL — ABNORMAL HIGH (ref 0.350–4.500)

## 2014-12-03 SURGERY — IRRIGATION AND DEBRIDEMENT EXTREMITY
Anesthesia: Monitor Anesthesia Care | Site: Foot | Laterality: Right

## 2014-12-03 MED ORDER — LIDOCAINE HCL (PF) 1 % IJ SOLN
INTRAMUSCULAR | Status: DC | PRN
  Administered 2014-12-03: 9 mL

## 2014-12-03 MED ORDER — FENTANYL CITRATE (PF) 100 MCG/2ML IJ SOLN: 25.0000 ug | INTRAMUSCULAR | Status: DC | PRN

## 2014-12-03 MED ORDER — ONDANSETRON HCL 4 MG/2ML IJ SOLN: 4.0000 mg | Freq: Once | INTRAMUSCULAR | Status: DC | PRN

## 2014-12-03 MED ORDER — LACTATED RINGERS IV SOLN
INTRAVENOUS | Status: DC | PRN
  Administered 2014-12-03: 15:00:00 via INTRAVENOUS

## 2014-12-03 MED ORDER — PROPOFOL 10 MG/ML IV BOLUS
INTRAVENOUS | Status: AC
  Filled 2014-12-03: qty 20

## 2014-12-03 MED ORDER — LACTATED RINGERS IV SOLN
INTRAVENOUS | Status: DC
  Administered 2014-12-03: 13:00:00 via INTRAVENOUS

## 2014-12-03 MED ORDER — FENTANYL CITRATE (PF) 250 MCG/5ML IJ SOLN
INTRAMUSCULAR | Status: AC
  Filled 2014-12-03: qty 5

## 2014-12-03 MED ORDER — LIDOCAINE HCL (PF) 1 % IJ SOLN
INTRAMUSCULAR | Status: AC
  Filled 2014-12-03: qty 30

## 2014-12-03 MED ORDER — SODIUM CHLORIDE 0.9 % IR SOLN
Status: DC | PRN
  Administered 2014-12-03: 1000 mL

## 2014-12-03 MED ORDER — CEFAZOLIN SODIUM-DEXTROSE 2-3 GM-% IV SOLR
2.0000 g | INTRAVENOUS | Status: AC
  Administered 2014-12-03: 2 g via INTRAVENOUS

## 2014-12-03 SURGICAL SUPPLY — 63 items
BANDAGE ELASTIC 3 VELCRO ST LF (GAUZE/BANDAGES/DRESSINGS) IMPLANT
BLADE SURG 10 STRL SS (BLADE) ×3 IMPLANT
BNDG COHESIVE 1X5 TAN STRL LF (GAUZE/BANDAGES/DRESSINGS) IMPLANT
BNDG COHESIVE 4X5 TAN STRL (GAUZE/BANDAGES/DRESSINGS) ×3 IMPLANT
BNDG COHESIVE 6X5 TAN STRL LF (GAUZE/BANDAGES/DRESSINGS) ×2 IMPLANT
BNDG CONFORM 3 STRL LF (GAUZE/BANDAGES/DRESSINGS) IMPLANT
BNDG GAUZE ELAST 4 BULKY (GAUZE/BANDAGES/DRESSINGS) ×2 IMPLANT
BNDG GAUZE STRTCH 6 (GAUZE/BANDAGES/DRESSINGS) ×3 IMPLANT
CORDS BIPOLAR (ELECTRODE) IMPLANT
COVER SURGICAL LIGHT HANDLE (MISCELLANEOUS) ×3 IMPLANT
CUFF TOURNIQUET SINGLE 18IN (TOURNIQUET CUFF) ×1 IMPLANT
CUFF TOURNIQUET SINGLE 24IN (TOURNIQUET CUFF) IMPLANT
CUFF TOURNIQUET SINGLE 34IN LL (TOURNIQUET CUFF) IMPLANT
CUFF TOURNIQUET SINGLE 44IN (TOURNIQUET CUFF) IMPLANT
DRAPE ORTHO SPLIT 77X108 STRL (DRAPES) ×6
DRAPE SURG 17X23 STRL (DRAPES) IMPLANT
DRAPE SURG ORHT 6 SPLT 77X108 (DRAPES) ×2 IMPLANT
DRAPE U-SHAPE 47X51 STRL (DRAPES) ×3 IMPLANT
DRSG PAD ABDOMINAL 8X10 ST (GAUZE/BANDAGES/DRESSINGS) ×2 IMPLANT
DURAPREP 26ML APPLICATOR (WOUND CARE) ×1 IMPLANT
ELECT CAUTERY BLADE 6.4 (BLADE) IMPLANT
ELECT REM PT RETURN 9FT ADLT (ELECTROSURGICAL)
ELECTRODE REM PT RTRN 9FT ADLT (ELECTROSURGICAL) IMPLANT
GAUZE SPONGE 4X4 12PLY STRL (GAUZE/BANDAGES/DRESSINGS) ×1 IMPLANT
GAUZE XEROFORM 1X8 LF (GAUZE/BANDAGES/DRESSINGS) IMPLANT
GAUZE XEROFORM 5X9 LF (GAUZE/BANDAGES/DRESSINGS) ×2 IMPLANT
GLOVE BIO SURGEON STRL SZ8 (GLOVE) ×3 IMPLANT
GLOVE BIOGEL PI IND STRL 6.5 (GLOVE) IMPLANT
GLOVE BIOGEL PI IND STRL 8 (GLOVE) ×2 IMPLANT
GLOVE BIOGEL PI INDICATOR 6.5 (GLOVE) ×2
GLOVE BIOGEL PI INDICATOR 8 (GLOVE) ×4
GLOVE ORTHO TXT STRL SZ7.5 (GLOVE) ×3 IMPLANT
GLOVE SURG SS PI 7.0 STRL IVOR (GLOVE) ×2 IMPLANT
GOWN STRL REUS W/ TWL LRG LVL3 (GOWN DISPOSABLE) ×1 IMPLANT
GOWN STRL REUS W/ TWL XL LVL3 (GOWN DISPOSABLE) ×4 IMPLANT
GOWN STRL REUS W/TWL LRG LVL3 (GOWN DISPOSABLE) ×3
GOWN STRL REUS W/TWL XL LVL3 (GOWN DISPOSABLE) ×9
HANDPIECE INTERPULSE COAX TIP (DISPOSABLE)
KIT BASIN OR (CUSTOM PROCEDURE TRAY) ×3 IMPLANT
KIT ROOM TURNOVER OR (KITS) ×3 IMPLANT
MANIFOLD NEPTUNE II (INSTRUMENTS) ×3 IMPLANT
NS IRRIG 1000ML POUR BTL (IV SOLUTION) ×3 IMPLANT
PACK ORTHO EXTREMITY (CUSTOM PROCEDURE TRAY) ×3 IMPLANT
PAD ARMBOARD 7.5X6 YLW CONV (MISCELLANEOUS) ×6 IMPLANT
PADDING CAST ABS 4INX4YD NS (CAST SUPPLIES) ×2
PADDING CAST ABS COTTON 4X4 ST (CAST SUPPLIES) ×1 IMPLANT
PADDING CAST COTTON 6X4 STRL (CAST SUPPLIES) ×1 IMPLANT
SET HNDPC FAN SPRY TIP SCT (DISPOSABLE) IMPLANT
SPONGE GAUZE 4X4 12PLY STER LF (GAUZE/BANDAGES/DRESSINGS) ×2 IMPLANT
SPONGE LAP 18X18 X RAY DECT (DISPOSABLE) IMPLANT
STOCKINETTE IMPERVIOUS 9X36 MD (GAUZE/BANDAGES/DRESSINGS) ×1 IMPLANT
SUT ETHILON 2 0 FS 18 (SUTURE) ×7 IMPLANT
SUT ETHILON 3 0 PS 1 (SUTURE) ×2 IMPLANT
SYR CONTROL 10ML LL (SYRINGE) IMPLANT
TOWEL OR 17X24 6PK STRL BLUE (TOWEL DISPOSABLE) ×3 IMPLANT
TOWEL OR 17X26 10 PK STRL BLUE (TOWEL DISPOSABLE) ×3 IMPLANT
TUBE ANAEROBIC SPECIMEN COL (MISCELLANEOUS) IMPLANT
TUBE CONNECTING 12'X1/4 (SUCTIONS) ×1
TUBE CONNECTING 12X1/4 (SUCTIONS) ×2 IMPLANT
TUBE FEEDING 5FR 15 INCH (TUBING) IMPLANT
UNDERPAD 30X30 INCONTINENT (UNDERPADS AND DIAPERS) ×3 IMPLANT
WATER STERILE IRR 1000ML POUR (IV SOLUTION) ×3 IMPLANT
YANKAUER SUCT BULB TIP NO VENT (SUCTIONS) ×3 IMPLANT

## 2014-12-03 NOTE — H&P (Signed)
[name] is an 81 y.o. female.   Chief Complaint:   Chronic non-healing right foot wound HPI:   81 yo female with the history of a non-healing wound of her right great toe.  When she developed osteo and the inability to heal the wound, she underwent a right great toe amputation thru the 1st metatarsal.  She has since been unable to heal that wound. She now presents for a repeat I&D of her right foot wound with the hope of getting to viable bleeding tissue and closing the wound.  Past Medical History  Diagnosis Date  . Myocardial infarction ([place])   . Coronary artery disease   . Hypertension   . Dysrhythmia   . Peripheral vascular disease ([place])   . Hypothyroidism   . Diabetes mellitus   . Presbyesophagus 04/23/2011  . Urinary retention with incomplete bladder emptying 04/23/2011  . UTI (lower urinary tract infection) 04/21/2011  . Diastolic CHF, chronic (HCC)     Grade I diastolic dysfunction on Echo 04/26/11  . Anaphylaxis 04/25/2011    Thought to be from Rocephin  . History of atrial flutter     ablation  . C. difficile colitis 04/17/2012  . Atrial flutter ([place]) 11/01/2012  . HCAP (healthcare-associated pneumonia) 10/21/2014  . Stroke [facility]) 2003    "lost the use of all of my right side"  . HOH (hard of hearing)     Past Surgical History  Procedure Laterality Date  . Cholecystectomy    . Colonoscopy  01/15/2011    Procedure: COLONOSCOPY;  Surgeon: Beryle Beams, MD;  Location: [facility];  Service: Endoscopy;  Laterality: N/A;  . Amputation Right 04/14/2012    Procedure: AMPUTATION RAY;  Surgeon: Newt Minion, MD;  Location: [facility] ORS;  Service: Orthopedics;  Laterality: Right;  ankle block  . Cardiac electrophysiology mapping and ablation    . Nm myocar perf wall motion  01/23/2009    mild ischemia mid inferior & apical inferior.  EF 75%  . Amputation Right 08/10/2014    Procedure: AMPUTATION RAY;  Surgeon: Mcarthur Rossetti, MD;  Location: [facility] OR[facility];  Service: Orthopedics;   Laterality: Right;  ankle block  . Appendectomy    . Shoulder surgery Right     "put back in place'  . Eye surgery Bilateral     Cararact    Family History  Problem Relation Age of Onset  . Cancer Sister   . Diabetes Mother   . Heart disease Father    Social History:  reports that she quit smoking about 33 years ago. Her smoking use included Cigarettes. She has a 4 pack-year smoking history. She has never used smokeless tobacco. She reports that she drinks alcohol. She reports that she does not use illicit drugs.  Allergies:  Allergies  Allergen Reactions  . Ephedrine     Unknown.    . Morphine Other (See Comments)    Difficulty waking up & talking after surgery    No prescriptions prior to admission    No results found for this or any previous visit (from the past 48 hour(s)). No results found.  Review of Systems  All other systems reviewed and are negative.   There were no vitals taken for this visit. Physical Exam  Constitutional: She is oriented to person, place, and time. She appears well-developed and well-nourished.  HENT:  Head: Normocephalic and atraumatic.  Eyes: Conjunctivae are normal. Pupils are equal, round, and reactive to light.  Neck: Normal range  of motion. Neck supple.  Cardiovascular: Normal rate.   Respiratory: Effort normal.  GI: Soft. Bowel sounds are normal.  Musculoskeletal:       Feet:  Neurological: She is alert and oriented to person, place, and time.  Skin: Skin is warm and dry.  Psychiatric: She has a normal mood and affect.     Assessment/Plan Right foot with chronic wound 1)  To the OR today for a repeat irrigation and debridement of the wound with the hope of closing the wound to get complete healing.  Mcarthur Rossetti 12/03/2014, 12:13 PM

## 2014-12-03 NOTE — Discharge Instructions (Signed)
You may put your weight on your right foot. Keep your right foot dressing clean and dry.

## 2014-12-03 NOTE — Transfer of Care (Signed)
Immediate Anesthesia Transfer of Care Note  Patient: [name]  Procedure(s) Performed: Procedure(s): IRRIGATION AND DEBRIDEMENT RIGHT FOOT (Right)  Patient Location: PACU  Anesthesia Type:MAC  Level of Consciousness: awake, alert  and oriented  Airway & Oxygen Therapy: Patient Spontanous Breathing and Patient connected to nasal cannula oxygen  Post-op Assessment: Report given to RN and Post -op Vital signs reviewed and stable  Post vital signs: Reviewed and stable  Last Vitals:  Filed Vitals:   12/03/14 1248  BP: 124/50  Pulse: 60  Temp: 36.4 C  Resp: 18    Complications: No apparent anesthesia complications

## 2014-12-03 NOTE — Discharge Summary (Signed)
Patient ID: THERSIA PETRAGLIA MRN: [id number] DOB/AGE: 1919/12/24 81 y.o.  Admit date: 12/03/2014 Discharge date: 12/03/2014  Admission Diagnoses:  Principal Problem:   Open chronic wound of right foot   Discharge Diagnoses:  Same  Past Medical History  Diagnosis Date  . Myocardial infarction ([place])   . Coronary artery disease   . Hypertension   . Dysrhythmia   . Peripheral vascular disease ([place])   . Hypothyroidism   . Diabetes mellitus   . Presbyesophagus 04/23/2011  . Urinary retention with incomplete bladder emptying 04/23/2011  . UTI (lower urinary tract infection) 04/21/2011  . Diastolic CHF, chronic (HCC)     Grade I diastolic dysfunction on Echo 04/26/11  . Anaphylaxis 04/25/2011    Thought to be from Rocephin  . History of atrial flutter     ablation  . C. difficile colitis 04/17/2012  . Atrial flutter ([place]) 11/01/2012  . HCAP (healthcare-associated pneumonia) 10/21/2014  . Stroke [facility]) 2003    "lost the use of all of my right side"  . HOH (hard of hearing)     Surgeries: Procedure(s): IRRIGATION AND DEBRIDEMENT RIGHT FOOT on 12/03/2014   Consultants:    Discharged Condition: Improved  Hospital Course: ELAYAH KLOOSTER is an 81 y.o. female who was admitted 12/03/2014 for operative treatment ofOpen wound of foot. Patient has severe unremitting pain that affects sleep, daily activities, and work/hobbies. After pre-op clearance the patient was taken to the operating room on 12/03/2014 and underwent  Procedure(s): IRRIGATION AND DEBRIDEMENT RIGHT FOOT.    Patient was given perioperative antibiotics: Anti-infectives    Start     Dose/Rate Route Frequency Ordered Stop   12/03/14 1300  ceFAZolin (ANCEF) IVPB 2 g/50 mL premix     2 g 100 mL/hr over 30 Minutes Intravenous To [facility] 12/03/14 1236 12/03/14 1446       Patient was given sequential compression devices, early ambulation, and chemoprophylaxis to prevent DVT.  Patient benefited maximally from hospital  stay and there were no complications.    Recent vital signs: Patient Vitals for the past 24 hrs:  BP Temp Temp src Pulse Resp SpO2 Height Weight  12/03/14 1530 137/60 mmHg 97.2 F (36.2 C) - - 12 - - -  12/03/14 1310 - - - - - - 5\' 8"  (1.727 m) 72.122 kg (159 lb)  12/03/14 1248 (!) 124/50 mmHg 97.5 F (36.4 C) Oral 60 18 95 % - -     Recent laboratory studies:  Recent Labs  12/03/14 1303  WBC 9.8  HGB 11.3*  HCT 35.8*  PLT 398  NA 134*  K 5.1  CL 99*  CO2 25  BUN 48*  CREATININE 1.65*  GLUCOSE 163*  CALCIUM 8.9     Discharge Medications:     Medication List    TAKE these medications        aspirin 81 MG tablet  Take 81 mg by mouth daily.     benzonatate 100 MG capsule  Commonly known as:  TESSALON  Take 1 capsule (100 mg total) by mouth 3 (three) times daily as needed for cough.     carvedilol 6.25 MG tablet  Commonly known as:  COREG  Take 6.25 mg by mouth 2 (two) times daily with a meal.     diltiazem 180 MG 24 hr capsule  Commonly known as:  CARDIZEM CD  Take 180 mg by mouth at bedtime.     furosemide 20 MG tablet  Commonly known as:  LASIX  TAKE 1 TABLET BY MOUTH 2 TIMES DAILY.*PLEASE MAKE APPOINTMENT FOR REFILLS*     gabapentin 300 MG capsule  Commonly known as:  NEURONTIN  Take 300 mg by mouth 2 (two) times daily.     insulin aspart 100 UNIT/ML injection  Commonly known as:  novoLOG  Inject 2-12 Units into the skin 3 (three) times daily before meals. <70(0 units), 70-140 (0 units),141-180 (2 units), 181-220(4 units), 221-260 (6 units), 261-300(8 units), 301-340(10 units), >340 (12 units) notify MD     insulin glargine 100 UNIT/ML injection  Commonly known as:  LANTUS  Inject 0.4 mLs (40 Units total) into the skin every morning.     isosorbide mononitrate 30 MG 24 hr tablet  Commonly known as:  IMDUR  Take 30 mg by mouth at bedtime.     levothyroxine 175 MCG tablet  Commonly known as:  SYNTHROID, LEVOTHROID  Take 175 mcg by mouth daily  before breakfast.     multivitamin tablet  Take 1 tablet by mouth daily.     nutrition supplement (JUVEN) Pack  Take 1 packet by mouth 2 (two) times daily with a meal.     tamsulosin 0.4 MG Caps capsule  Commonly known as:  FLOMAX  Take 0.4 mg by mouth every morning.     VITAMIN E PO  Take 1 capsule by mouth daily.        Diagnostic Studies: No results found.  Disposition: 01-Home or Self Care      Discharge Instructions    Discharge patient    Complete by:  As directed            Follow-up Information    Follow up with Mcarthur Rossetti, MD In 2 weeks.   Specialty:  Orthopedic Surgery   Contact information:   [place] [address] [phone]        Signed: Mcarthur Rossetti 12/03/2014, 5:03 PM

## 2014-12-03 NOTE — Anesthesia Postprocedure Evaluation (Signed)
Anesthesia Post Note  Patient: [name]  Procedure(s) Performed: Procedure(s) (LRB): IRRIGATION AND DEBRIDEMENT RIGHT FOOT (Right)  Anesthesia type: MAC  Patient location: PACU  Post pain: Pain level controlled and Adequate analgesia  Post assessment: Post-op Vital signs reviewed, Patient's Cardiovascular Status Stable and Respiratory Function Stable  Last Vitals:  Filed Vitals:   12/03/14 1530  BP: 137/60  Pulse:   Temp: 36.2 C  Resp: 12    Post vital signs: Reviewed and stable  Level of consciousness: awake, alert  and oriented  Complications: No apparent anesthesia complications

## 2014-12-03 NOTE — Progress Notes (Signed)
Called Dr.Hodierne for sign out  

## 2014-12-03 NOTE — Brief Op Note (Signed)
12/03/2014  3:27 PM  PATIENT:  [name]  81 y.o. female  PRE-OPERATIVE DIAGNOSIS:  right foot chronic wound  POST-OPERATIVE DIAGNOSIS:  right foot chronic wound  PROCEDURE:  Procedure(s): IRRIGATION AND DEBRIDEMENT RIGHT FOOT (Right) Closure of wound  SURGEON:  Surgeon(s) and Role:    * Mcarthur Rossetti, MD - Primary  PHYSICIAN ASSISTANT: Benita Stabile, PA-C  ANESTHESIA:   local  EBL:   < 50 cc  LOCAL MEDICATIONS USED:  MARCAINE     COUNTS:  YES  TOURNIQUET:  * No tourniquets in log *  DICTATION: .Other Dictation: Dictation Number [id number]  PLAN OF CARE: Discharge to home after PACU  PATIENT DISPOSITION:  PACU - hemodynamically stable.   Delay start of Pharmacological VTE agent (>24hrs) due to surgical blood loss or risk of bleeding: not applicable

## 2014-12-03 NOTE — Anesthesia Procedure Notes (Signed)
Procedure Name: MAC Date/Time: 12/03/2014 2:50 PM Performed by: Eligha Bridegroom Pre-anesthesia Checklist: Patient identified, Emergency Drugs available, Timeout performed, Patient being monitored and Suction available Patient Re-evaluated:Patient Re-evaluated prior to inductionOxygen Delivery Method: Simple face mask

## 2014-12-03 NOTE — Anesthesia Preprocedure Evaluation (Addendum)
Anesthesia Evaluation  Patient identified by MRN, date of birth, ID band Patient awake    Reviewed: Allergy & Precautions, NPO status , Patient's Chart, lab work & pertinent test results  History of Anesthesia Complications Negative for: history of anesthetic complications  Airway Mallampati: III  TM Distance: >3 FB Neck ROM: Full    Dental no notable dental hx. (+) Dental Advisory Given, Edentulous Upper, Edentulous Lower, Upper Dentures, Lower Dentures   Pulmonary former smoker,    Pulmonary exam normal breath sounds clear to auscultation       Cardiovascular hypertension, Pt. on medications and Pt. on home beta blockers + CAD, + Past MI, + Peripheral Vascular Disease and +CHF  Normal cardiovascular exam+ dysrhythmias Atrial Fibrillation  Rhythm:Regular Rate:Normal     Neuro/Psych CVA    GI/Hepatic   Endo/Other  diabetes, Insulin DependentHypothyroidism   Renal/GU Renal disease     Musculoskeletal   Abdominal   Peds  Hematology   Anesthesia Other Findings   Reproductive/Obstetrics                           Anesthesia Physical Anesthesia Plan  ASA: III  Anesthesia Plan: MAC   Post-op Pain Management:    Induction: Intravenous  Airway Management Planned: Simple Face Mask  Additional Equipment:   Intra-op Plan:   Post-operative Plan:   Informed Consent: I have reviewed the patients History and Physical, chart, labs and discussed the procedure including the risks, benefits and alternatives for the proposed anesthesia with the patient or authorized representative who has indicated his/her understanding and acceptance.   Dental advisory given  Plan Discussed with: CRNA  Anesthesia Plan Comments:         Anesthesia Quick Evaluation

## 2014-12-04 ENCOUNTER — Encounter (HOSPITAL_COMMUNITY): Payer: Self-pay | Admitting: Orthopaedic Surgery

## 2014-12-04 LAB — HEMOGLOBIN A1C
HEMOGLOBIN A1C: 10.8 % — AB (ref 4.8–5.6)
MEAN PLASMA GLUCOSE: 263 mg/dL

## 2014-12-04 NOTE — Op Note (Signed)
[name], [name] NO.:  [id number]  MEDICAL RECORD NO.:  [id number]  LOCATION:  [facility]                         FACILITY:  [facility]  PHYSICIAN:  Lind Guest. Ninfa Linden, M.D.DATE OF BIRTH:  November 03, 1919  DATE OF PROCEDURE:  12/03/2014 DATE OF DISCHARGE:                              OPERATIVE REPORT   POSTOPERATIVE DIAGNOSIS:  Right chronic foot wound status post first ray resection with exposed necrotic skin, soft tissue, fascia, and bone.  PROCEDURE: 1. Irrigation and debridement of right foot wound with excisional     debridement of necrotic skin, fascia, soft tissue, and the     remainder of the first metatarsal. 2. Closure of the right foot wound.  SURGEON:  Lind Guest. Ninfa Linden, M.D.  ASSISTANT:  Erskine Emery, PA-C.  ANESTHESIA:  Local with 0.25% plain Marcaine.  BLOOD LOSS:  Less than 30 mL.  COMPLICATIONS:  None.  INDICATIONS:  [name] is a 81 year old female, who had a chronic right foot great toe wound for well over a year, being treated at the [facility] and then started to develop osteomyelitis.  She underwent a great toe amputation to the first metatarsal that she said was broken down that wound and had a bedside irrigation and debridement when she was in the hospital with pneumonia and now presents with a continued chronic wound that needs a formal irrigation, debridement, and closure to be able to backup bone to get a nice soft tissue closure.  She understands the risks and benefits of this.  She has significant neuropathy and does consent to have this done in a surgical setting.  Her daughter consents for this as well.  DESCRIPTION OF PROCEDURE:  After informed consent was obtained, appropriate right foot was marked.  She was brought to the operating room, placed supine on the operating table.  Her right foot was then prepped and draped with DuraPrep and sterile drapes.  Time-out was called and she was identified as correct patient  and correct right foot. We then used local anesthesia around her foot wound and then we were able to extend this proximally and distally with a #10 blade.  We sharply excised necrotic soft tissue including skin and fascia, tendons, and then used a bone cutting forceps to cut the metatarsal back to the proximal metatarsal to remove bone.  There was no evidence of infection at all.  We then irrigated the soft tissues in normal saline solution. We were able to remove the skin edges with a knife and then was able to have a nice loose closure with interrupted 2-0 nylon suture to close the wound in its entirety.  We then placed well-padded sterile dressing. She was taken to recovery room without need of any type of sedation or general anesthesia.  Postoperatively, we will let her go home this evening with a followup in the office in 2-3 weeks.     Lind Guest. Ninfa Linden, M.D.     CYB/MEDQ  D:  12/03/2014  T:  12/04/2014  Job:  [id number]

## 2015-04-04 ENCOUNTER — Inpatient Hospital Stay (HOSPITAL_COMMUNITY)
Admission: EM | Admit: 2015-04-04 | Discharge: 2015-04-08 | DRG: 617 | Disposition: A | Discharge status: Skilled Nursing Facility | Payer: Medicare Other | Attending: Internal Medicine | Admitting: Internal Medicine

## 2015-04-04 ENCOUNTER — Emergency Department (HOSPITAL_COMMUNITY): Payer: Medicare Other

## 2015-04-04 ENCOUNTER — Encounter (HOSPITAL_COMMUNITY): Payer: Self-pay | Admitting: Emergency Medicine

## 2015-04-04 DIAGNOSIS — D638 Anemia in other chronic diseases classified elsewhere: Secondary | ICD-10-CM | POA: Diagnosis present

## 2015-04-04 DIAGNOSIS — E1142 Type 2 diabetes mellitus with diabetic polyneuropathy: Secondary | ICD-10-CM | POA: Diagnosis present

## 2015-04-04 DIAGNOSIS — Z888 Allergy status to other drugs, medicaments and biological substances status: Secondary | ICD-10-CM

## 2015-04-04 DIAGNOSIS — M7989 Other specified soft tissue disorders: Secondary | ICD-10-CM | POA: Diagnosis present

## 2015-04-04 DIAGNOSIS — L03115 Cellulitis of right lower limb: Secondary | ICD-10-CM | POA: Diagnosis present

## 2015-04-04 DIAGNOSIS — M86671 Other chronic osteomyelitis, right ankle and foot: Secondary | ICD-10-CM | POA: Diagnosis present

## 2015-04-04 DIAGNOSIS — E11628 Type 2 diabetes mellitus with other skin complications: Secondary | ICD-10-CM

## 2015-04-04 DIAGNOSIS — I4892 Unspecified atrial flutter: Secondary | ICD-10-CM | POA: Diagnosis present

## 2015-04-04 DIAGNOSIS — R4701 Aphasia: Secondary | ICD-10-CM | POA: Diagnosis not present

## 2015-04-04 DIAGNOSIS — E1169 Type 2 diabetes mellitus with other specified complication: Secondary | ICD-10-CM | POA: Diagnosis present

## 2015-04-04 DIAGNOSIS — M8660 Other chronic osteomyelitis, unspecified site: Secondary | ICD-10-CM | POA: Diagnosis not present

## 2015-04-04 DIAGNOSIS — L03031 Cellulitis of right toe: Secondary | ICD-10-CM

## 2015-04-04 DIAGNOSIS — Z79899 Other long term (current) drug therapy: Secondary | ICD-10-CM

## 2015-04-04 DIAGNOSIS — E871 Hypo-osmolality and hyponatremia: Secondary | ICD-10-CM | POA: Diagnosis present

## 2015-04-04 DIAGNOSIS — N184 Chronic kidney disease, stage 4 (severe): Secondary | ICD-10-CM | POA: Diagnosis present

## 2015-04-04 DIAGNOSIS — Z89421 Acquired absence of other right toe(s): Secondary | ICD-10-CM | POA: Diagnosis not present

## 2015-04-04 DIAGNOSIS — Z87891 Personal history of nicotine dependence: Secondary | ICD-10-CM | POA: Diagnosis not present

## 2015-04-04 DIAGNOSIS — E1122 Type 2 diabetes mellitus with diabetic chronic kidney disease: Secondary | ICD-10-CM | POA: Diagnosis present

## 2015-04-04 DIAGNOSIS — I2510 Atherosclerotic heart disease of native coronary artery without angina pectoris: Secondary | ICD-10-CM | POA: Diagnosis present

## 2015-04-04 DIAGNOSIS — E11621 Type 2 diabetes mellitus with foot ulcer: Secondary | ICD-10-CM | POA: Diagnosis present

## 2015-04-04 DIAGNOSIS — I129 Hypertensive chronic kidney disease with stage 1 through stage 4 chronic kidney disease, or unspecified chronic kidney disease: Secondary | ICD-10-CM | POA: Diagnosis present

## 2015-04-04 DIAGNOSIS — D62 Acute posthemorrhagic anemia: Secondary | ICD-10-CM | POA: Diagnosis not present

## 2015-04-04 DIAGNOSIS — E875 Hyperkalemia: Secondary | ICD-10-CM | POA: Diagnosis present

## 2015-04-04 DIAGNOSIS — Z8673 Personal history of transient ischemic attack (TIA), and cerebral infarction without residual deficits: Secondary | ICD-10-CM | POA: Diagnosis present

## 2015-04-04 DIAGNOSIS — Z7982 Long term (current) use of aspirin: Secondary | ICD-10-CM | POA: Diagnosis not present

## 2015-04-04 DIAGNOSIS — L97519 Non-pressure chronic ulcer of other part of right foot with unspecified severity: Secondary | ICD-10-CM | POA: Diagnosis present

## 2015-04-04 DIAGNOSIS — I998 Other disorder of circulatory system: Secondary | ICD-10-CM | POA: Diagnosis present

## 2015-04-04 DIAGNOSIS — Z885 Allergy status to narcotic agent status: Secondary | ICD-10-CM | POA: Diagnosis not present

## 2015-04-04 DIAGNOSIS — E1165 Type 2 diabetes mellitus with hyperglycemia: Secondary | ICD-10-CM | POA: Diagnosis present

## 2015-04-04 DIAGNOSIS — I739 Peripheral vascular disease, unspecified: Secondary | ICD-10-CM | POA: Diagnosis present

## 2015-04-04 DIAGNOSIS — I10 Essential (primary) hypertension: Secondary | ICD-10-CM | POA: Diagnosis not present

## 2015-04-04 DIAGNOSIS — H9190 Unspecified hearing loss, unspecified ear: Secondary | ICD-10-CM | POA: Diagnosis present

## 2015-04-04 DIAGNOSIS — I5032 Chronic diastolic (congestive) heart failure: Secondary | ICD-10-CM | POA: Diagnosis present

## 2015-04-04 DIAGNOSIS — E039 Hypothyroidism, unspecified: Secondary | ICD-10-CM

## 2015-04-04 DIAGNOSIS — Z794 Long term (current) use of insulin: Secondary | ICD-10-CM | POA: Diagnosis not present

## 2015-04-04 DIAGNOSIS — I252 Old myocardial infarction: Secondary | ICD-10-CM

## 2015-04-04 DIAGNOSIS — L0390 Cellulitis, unspecified: Secondary | ICD-10-CM | POA: Diagnosis present

## 2015-04-04 DIAGNOSIS — I69320 Aphasia following cerebral infarction: Secondary | ICD-10-CM | POA: Diagnosis not present

## 2015-04-04 DIAGNOSIS — M86672 Other chronic osteomyelitis, left ankle and foot: Secondary | ICD-10-CM

## 2015-04-04 DIAGNOSIS — L089 Local infection of the skin and subcutaneous tissue, unspecified: Secondary | ICD-10-CM

## 2015-04-04 LAB — CBC WITH DIFFERENTIAL/PLATELET
BASOS ABS: 0.1 10*3/uL (ref 0.0–0.1)
BASOS PCT: 1 %
Eosinophils Absolute: 0.3 10*3/uL (ref 0.0–0.7)
Eosinophils Relative: 3 %
HEMATOCRIT: 36.1 % (ref 36.0–46.0)
HEMOGLOBIN: 11.8 g/dL — AB (ref 12.0–15.0)
Lymphocytes Relative: 19 %
Lymphs Abs: 2 10*3/uL (ref 0.7–4.0)
MCH: 27 pg (ref 26.0–34.0)
MCHC: 32.7 g/dL (ref 30.0–36.0)
MCV: 82.6 fL (ref 78.0–100.0)
MONOS PCT: 10 %
Monocytes Absolute: 1 10*3/uL (ref 0.1–1.0)
NEUTROS ABS: 6.8 10*3/uL (ref 1.7–7.7)
NEUTROS PCT: 67 %
Platelets: 306 10*3/uL (ref 150–400)
RBC: 4.37 MIL/uL (ref 3.87–5.11)
RDW: 13.7 % (ref 11.5–15.5)
WBC: 10.2 10*3/uL (ref 4.0–10.5)

## 2015-04-04 LAB — CBG MONITORING, ED
GLUCOSE-CAPILLARY: 204 mg/dL — AB (ref 65–99)
GLUCOSE-CAPILLARY: 235 mg/dL — AB (ref 65–99)

## 2015-04-04 LAB — BASIC METABOLIC PANEL
ANION GAP: 10 (ref 5–15)
BUN: 54 mg/dL — ABNORMAL HIGH (ref 6–20)
CHLORIDE: 96 mmol/L — AB (ref 101–111)
CO2: 28 mmol/L (ref 22–32)
Calcium: 9.1 mg/dL (ref 8.9–10.3)
Creatinine, Ser: 1.76 mg/dL — ABNORMAL HIGH (ref 0.44–1.00)
GFR calc non Af Amer: 24 mL/min — ABNORMAL LOW (ref >60)
GFR, EST AFRICAN AMERICAN: 28 mL/min — AB (ref >60)
Glucose, Bld: 219 mg/dL — ABNORMAL HIGH (ref 65–99)
POTASSIUM: 5.4 mmol/L — AB (ref 3.5–5.1)
Sodium: 134 mmol/L — ABNORMAL LOW (ref 135–145)

## 2015-04-04 LAB — SEDIMENTATION RATE: SED RATE: 69 mm/h — AB (ref 0–22)

## 2015-04-04 MED ORDER — VANCOMYCIN HCL IN DEXTROSE 1-5 GM/200ML-% IV SOLN
1000.0000 mg | Freq: Once | INTRAVENOUS | Status: AC
  Administered 2015-04-04: 1000 mg via INTRAVENOUS
  Filled 2015-04-04: qty 200

## 2015-04-04 MED ORDER — ONDANSETRON HCL 4 MG/2ML IJ SOLN: 4.0000 mg | Freq: Four times a day (QID) | INTRAMUSCULAR | Status: DC | PRN

## 2015-04-04 MED ORDER — JUVEN PO PACK
1.0000 | PACK | Freq: Two times a day (BID) | ORAL | Status: DC
  Administered 2015-04-05 – 2015-04-08 (×6): 1 via ORAL
  Filled 2015-04-04 (×9): qty 1

## 2015-04-04 MED ORDER — INSULIN ASPART 100 UNIT/ML ~~LOC~~ SOLN
0.0000 [IU] | Freq: Three times a day (TID) | SUBCUTANEOUS | Status: DC
  Administered 2015-04-05: 7 [IU] via SUBCUTANEOUS
  Administered 2015-04-05: 3 [IU] via SUBCUTANEOUS

## 2015-04-04 MED ORDER — VANCOMYCIN HCL 500 MG IV SOLR
500.0000 mg | INTRAVENOUS | Status: DC
  Administered 2015-04-05 – 2015-04-06 (×2): 500 mg via INTRAVENOUS
  Filled 2015-04-04 (×3): qty 500

## 2015-04-04 MED ORDER — ACETAMINOPHEN 650 MG RE SUPP: 650.0000 mg | Freq: Four times a day (QID) | RECTAL | Status: DC | PRN

## 2015-04-04 MED ORDER — CARVEDILOL 6.25 MG PO TABS
6.2500 mg | ORAL_TABLET | Freq: Two times a day (BID) | ORAL | Status: DC
  Administered 2015-04-05 – 2015-04-08 (×6): 6.25 mg via ORAL
  Filled 2015-04-04 (×7): qty 1

## 2015-04-04 MED ORDER — ISOSORBIDE MONONITRATE ER 30 MG PO TB24
30.0000 mg | ORAL_TABLET | Freq: Every day | ORAL | Status: DC
  Administered 2015-04-04 – 2015-04-07 (×4): 30 mg via ORAL
  Filled 2015-04-04 (×5): qty 1

## 2015-04-04 MED ORDER — ONDANSETRON HCL 4 MG PO TABS: 4.0000 mg | ORAL_TABLET | Freq: Four times a day (QID) | ORAL | Status: DC | PRN

## 2015-04-04 MED ORDER — LEVOTHYROXINE SODIUM 50 MCG PO TABS
175.0000 ug | ORAL_TABLET | Freq: Every day | ORAL | Status: DC
  Administered 2015-04-05 – 2015-04-08 (×4): 175 ug via ORAL
  Filled 2015-04-04 (×5): qty 1

## 2015-04-04 MED ORDER — ASPIRIN EC 81 MG PO TBEC
81.0000 mg | DELAYED_RELEASE_TABLET | Freq: Every day | ORAL | Status: DC
  Administered 2015-04-04 – 2015-04-08 (×5): 81 mg via ORAL
  Filled 2015-04-04 (×6): qty 1

## 2015-04-04 MED ORDER — PIPERACILLIN-TAZOBACTAM 3.375 G IVPB 30 MIN
3.3750 g | Freq: Once | INTRAVENOUS | Status: AC
  Administered 2015-04-04: 3.375 g via INTRAVENOUS
  Filled 2015-04-04: qty 50

## 2015-04-04 MED ORDER — HYDROCODONE-ACETAMINOPHEN 5-325 MG PO TABS: 1.0000 | ORAL_TABLET | ORAL | Status: DC | PRN

## 2015-04-04 MED ORDER — HEPARIN SODIUM (PORCINE) 5000 UNIT/ML IJ SOLN
5000.0000 [IU] | Freq: Three times a day (TID) | INTRAMUSCULAR | Status: DC
  Administered 2015-04-04 – 2015-04-08 (×11): 5000 [IU] via SUBCUTANEOUS
  Filled 2015-04-04 (×13): qty 1

## 2015-04-04 MED ORDER — PIPERACILLIN-TAZOBACTAM IN DEX 2-0.25 GM/50ML IV SOLN
2.2500 g | Freq: Three times a day (TID) | INTRAVENOUS | Status: DC
  Administered 2015-04-05 – 2015-04-07 (×7): 2.25 g via INTRAVENOUS
  Filled 2015-04-04 (×10): qty 50

## 2015-04-04 MED ORDER — GABAPENTIN 300 MG PO CAPS
300.0000 mg | ORAL_CAPSULE | Freq: Two times a day (BID) | ORAL | Status: DC
  Administered 2015-04-04 – 2015-04-08 (×8): 300 mg via ORAL
  Filled 2015-04-04 (×8): qty 1

## 2015-04-04 MED ORDER — INSULIN GLARGINE 100 UNIT/ML ~~LOC~~ SOLN
30.0000 [IU] | Freq: Every morning | SUBCUTANEOUS | Status: DC
  Administered 2015-04-05: 30 [IU] via SUBCUTANEOUS
  Filled 2015-04-04: qty 0.3

## 2015-04-04 MED ORDER — ACETAMINOPHEN 325 MG PO TABS
650.0000 mg | ORAL_TABLET | Freq: Four times a day (QID) | ORAL | Status: DC | PRN
  Administered 2015-04-07: 650 mg via ORAL
  Filled 2015-04-04: qty 2

## 2015-04-04 MED ORDER — DILTIAZEM HCL ER COATED BEADS 180 MG PO CP24
180.0000 mg | ORAL_CAPSULE | Freq: Every day | ORAL | Status: DC
  Administered 2015-04-04 – 2015-04-07 (×4): 180 mg via ORAL
  Filled 2015-04-04 (×5): qty 1

## 2015-04-04 MED ORDER — BENZONATATE 100 MG PO CAPS: 100.0000 mg | ORAL_CAPSULE | Freq: Three times a day (TID) | ORAL | Status: DC | PRN

## 2015-04-04 MED ORDER — TAMSULOSIN HCL 0.4 MG PO CAPS
0.4000 mg | ORAL_CAPSULE | Freq: Every morning | ORAL | Status: DC
  Administered 2015-04-05 – 2015-04-08 (×4): 0.4 mg via ORAL
  Filled 2015-04-04 (×4): qty 1

## 2015-04-04 MED ORDER — SODIUM CHLORIDE 0.9 % IV SOLN
Freq: Once | INTRAVENOUS | Status: AC
  Administered 2015-04-04: 23:00:00 via INTRAVENOUS

## 2015-04-04 NOTE — Progress Notes (Signed)
Pharmacy Antibiotic Note  [name] is a 82 y.o. female admitted on 04/04/2015 with foot cellulitis in diabetic patient.  Pharmacy has been consulted for vancomycin and Zosyn dosing.  Plan: Vancomycin 1g in ED, then 500mg  IV every 24 hours.  Goal trough 15-20 mcg/mL.  Zosyn 3.375g IV x in ED, then 2.25g IV q8h Follow up renal function & adjust dosages as needed Follow up cultures and de-escalate as appropriate    Temp (24hrs), Avg:97.7 F (36.5 C), Min:97.6 F (36.4 C), Max:97.8 F (36.6 C)   Recent Labs Lab 04/04/15 1915  WBC 10.2  CREATININE 1.76*    CrCl cannot be calculated (Unknown ideal weight.).   CrCl 20 ml/min [place]ro[place]-Gault, 22 ml/min/1.33m2 (normalized)  Allergies  Allergen Reactions  . Ephedrine     Unknown.    . Morphine Other (See Comments)    Difficulty waking up & talking after surgery    Antimicrobials this admission: 3/10 >> Vanc >> 3/10 >> Zosyn >>  Levels/dose adjustments: 08/11/14 1500 VT = 14 mcg/ml on 750mg  q24h (prior to 4th overall dose)  Microbiology results: 3/10 BCx: sent  Thank you for allowing pharmacy to be a part of this patient's care.  Peggyann Juba, PharmD, BCPS Pager: [phone] 04/04/2015 10:24 PM

## 2015-04-04 NOTE — H&P (Addendum)
[name] History and Physical  [name]:[id number] DOB: Oct 16, 1919 DOA: 04/04/2015  Referring physician: ED PCP: [name], [name]   Chief Complaint: Right foot wound  HPI:   [name] is a 82 year old female with past medical history significant for diabetes mellitus type 2, s/p amputation, CAD, HTN, CVA with residual expressive aphasia, atrial flutter rate controlled; who presents with complaints of worsening wound on her right foot. Reports being in wound care for over one year and requires to wounds of the right foot. However last night she noticed that the right first digit was becoming more swollen and had a brown/black area developing. This morning when she woke up she noticed a significant change first more swelling here edema present surrounding that area. She decided to come in for further evaluation. Denies any significant drainage, fever, or chills. Denies any pain and she has no feeling in her feet. She reports couple episodes of nausea and vomiting that self resolved 3 days ago. Patient lives alone and utilizes a wheelchair to get around her home. She reports having a aide that comes in to see her for 4 hours a day and helps make sure that she has meals prepared.  Review of records shows that the patient last had the wound I&D Dr. Jean Rosenthal back in 11/2014.  Review of Systems  Constitutional: Negative for fever and chills.  HENT: Negative for ear discharge and nosebleeds.   Eyes: Negative for photophobia and pain.  Respiratory: Negative for sputum production and shortness of breath.   Cardiovascular: Negative for chest pain and palpitations.  Gastrointestinal: Positive for nausea and vomiting. Negative for abdominal pain, diarrhea and constipation.  Genitourinary: Negative for urgency and frequency.  Musculoskeletal: Negative for falls.  Skin: Negative for itching.       Erythema and swelling of the right foot  Neurological: Negative for seizures and loss  of consciousness.       Expressive aphasia chronic  Endo/Heme/Allergies: Negative for polydipsia. Bruises/bleeds easily.  Psychiatric/Behavioral: Negative for hallucinations and substance abuse.     Past Medical History  Diagnosis Date  . Myocardial infarction ([place])   . Coronary artery disease   . Hypertension   . Dysrhythmia   . Peripheral vascular disease ([place])   . Hypothyroidism   . Diabetes mellitus   . Presbyesophagus 04/23/2011  . Urinary retention with incomplete bladder emptying 04/23/2011  . UTI (lower urinary tract infection) 04/21/2011  . Diastolic CHF, chronic (HCC)     Grade I diastolic dysfunction on Echo 04/26/11  . Anaphylaxis 04/25/2011    Thought to be from Rocephin  . History of atrial flutter     ablation  . C. difficile colitis 04/17/2012  . Atrial flutter ([place]) 11/01/2012  . HCAP (healthcare-associated pneumonia) 10/21/2014  . Stroke [facility]) 2003    "lost the use of all of my right side"  . HOH (hard of hearing)      Past Surgical History  Procedure Laterality Date  . Cholecystectomy    . Colonoscopy  01/15/2011    Procedure: COLONOSCOPY;  Surgeon: Beryle Beams, [name];  [facility];  Service: Endoscopy;  Laterality: N/A;  . Amputation Right 04/14/2012    Procedure: AMPUTATION RAY;  Surgeon: Newt Minion, [name];  Location: [facility];  Service: Orthopedics;  Laterality: Right;  ankle block  . Cardiac electrophysiology mapping and ablation    . Nm myocar perf wall motion  01/23/2009    mild ischemia mid inferior & apical inferior.  EF 75%  . Amputation Right 08/10/2014    Procedure: AMPUTATION RAY;  Surgeon: Mcarthur Rossetti, [name];  [facility];  Service: Orthopedics;  Laterality: Right;  ankle block  . Appendectomy    . Shoulder surgery Right     "put back in place'  . Eye surgery Bilateral     Cararact  . I&d extremity Right 12/03/2014    Procedure: IRRIGATION AND DEBRIDEMENT RIGHT FOOT;  Surgeon: Mcarthur Rossetti, [name];  Location: [place];   Service: Orthopedics;  Laterality: Right;      Social History:  reports that she quit smoking about 33 years ago. Her smoking use included Cigarettes. She has a 4 pack-year smoking history. She has never used smokeless tobacco. She reports that she drinks alcohol. She reports that she does not use illicit drugs. Where does patient live--home   and with whom if at home? Alone   Allergies  Allergen Reactions  . Ephedrine     Unknown.    . Morphine Other (See Comments)    Difficulty waking up & talking after surgery    Family History  Problem Relation Age of Onset  . Cancer Sister   . Diabetes Mother   . Heart disease Father        Prior to Admission medications   Medication Sig Start Date End Date Taking? Authorizing Provider  aspirin 81 MG tablet Take 81 mg by mouth daily.   Yes Historical Provider, [name]  benzonatate (TESSALON) 100 MG capsule Take 1 capsule (100 mg total) by mouth 3 (three) times daily as needed for cough. 10/25/14  Yes Donne Hazel, [name]  carvedilol (COREG) 6.25 MG tablet Take 6.25 mg by mouth 2 (two) times daily with a meal.     Yes Historical Provider, [name]  diltiazem (CARDIZEM CD) 180 MG 24 hr capsule Take 180 mg by mouth at bedtime.    Yes Historical Provider, [name]  furosemide (LASIX) 20 MG tablet TAKE 1 TABLET BY MOUTH 2 TIMES DAILY.*PLEASE MAKE APPOINTMENT FOR REFILLS* 10/25/14  Yes Donne Hazel, [name]  gabapentin (NEURONTIN) 300 MG capsule Take 300 mg by mouth 2 (two) times daily. 02/17/12  Yes Ivan Anchors Love, PA-C  insulin aspart (NOVOLOG) 100 UNIT/ML injection Inject 2-12 Units into the skin 3 (three) times daily before meals. <70(0 units), 70-140 (0 units),141-180 (2 units), 181-220(4 units), 221-260 (6 units), 261-300(8 units), 301-340(10 units), >340 (12 units) notify [name]   Yes Historical Provider, [name]  insulin glargine (LANTUS) 100 UNIT/ML injection Inject 0.4 mLs (40 Units total) into the skin every morning. 08/11/14  Yes Venetia Maxon Rama, [name]  isosorbide  mononitrate (IMDUR) 30 MG 24 hr tablet Take 30 mg by mouth at bedtime.    Yes Historical Provider, [name]  levothyroxine (SYNTHROID, LEVOTHROID) 175 MCG tablet Take 175 mcg by mouth daily before breakfast.   Yes Historical Provider, [name]  Multiple Vitamin (MULTIVITAMIN) tablet Take 1 tablet by mouth daily.   Yes Historical Provider, [name]  nutrition supplement, JUVEN, (JUVEN) PACK Take 1 packet by mouth 2 (two) times daily with a meal. 08/11/14  Yes Venetia Maxon Rama, [name]  Tamsulosin HCl (FLOMAX) 0.4 MG CAPS Take 0.4 mg by mouth every morning.    Yes Historical Provider, [name]  VITAMIN E PO Take 1 capsule by mouth daily.   Yes Historical Provider, [name]     Physical Exam: Filed Vitals:   04/04/15 1734 04/04/15 2028  BP: 128/48 136/55  Pulse: 60 59  Temp: 97.8 F (36.6 C)  97.6 F (36.4 C)  TempSrc: Oral Oral  Resp: 13 20  SpO2: 94% 94%     Constitutional: Vital signs reviewed. Patient is a well-developed and well-nourished in no acute distress and cooperative with exam. Alert and oriented x3.  Head: Normocephalic and atraumatic  Ear: TM normal bilaterally  Mouth: no erythema or exudates, MMM  Eyes: PERRL, EOMI, conjunctivae normal, No scleral icterus.  Neck: Supple, Trachea midline normal ROM, No JVD, mass, thyromegaly, or carotid bruit present.  Cardiovascular: RRR, S1 normal, S2 normal, no MRG, pulses symmetric and intact bilaterally  Pulmonary/Chest: CTAB, no wheezes, rales, or rhonchi  Abdominal: Soft. Non-tender, non-distended, bowel sounds are normal, no masses, organomegaly, or guarding present.  GU: no CVA tenderness Musculoskeletal: Previous amputations noted on the right foot  Ext: Mild- moderate swelling of the right foot. no cyanosis, pulses palpable bilaterally (DP and PT)  Hematology: no cervical, inginal, or axillary adenopathy.  Neurological: A&O x3, Strenght is normal and symmetric bilaterally, cranial nerve II-XII are grossly intact, no focal motor deficit, sensory intact to light  touch bilaterally.  Skin:approximately 3-4 cm of erythema and warmth surrounding the first digit of the right foot. Patient has what appears to be dried blood with pus like discharge present. There is a foul odor to wounds. There is also a open ulcer on the medial aspect. Psychiatric: Normal mood and affect. Patient has expressive aphasia. Judgment and thought content normal. Cognition and memory are normal.      Data Review   Micro Results No results found for this or any previous visit (from the past 240 hour(s)).  Radiology Reports Dg Foot Complete Right  04/04/2015  CLINICAL DATA:  Diabetic foot wound. EXAM: RIGHT FOOT COMPLETE - 3+ VIEW COMPARISON:  08/08/2014 FINDINGS: Interval amputation of the great toe at the level of the distal metatarsal. There is erosive change of the medial cortex of the mid and proximal first metatarsal suggesting osteomyelitis. There is sclerotic periosteal reaction in this area consistent with chronic bony infection. Chronic fracture of the third metatarsal is unchanged. Prior amputation of the fourth toe is unchanged without evidence of osteomyelitis. IMPRESSION: Findings compatible with chronic osteomyelitis of the first metatarsal. Electronically Signed   By: Franchot Gallo M.D.   On: 04/04/2015 18:24     CBC  Recent Labs Lab 04/04/15 1915  WBC 10.2  HGB 11.8*  HCT 36.1  PLT 306  MCV 82.6  MCH 27.0  MCHC 32.7  RDW 13.7  LYMPHSABS 2.0  MONOABS 1.0  EOSABS 0.3  BASOSABS 0.1    Chemistries   Recent Labs Lab 04/04/15 1915  NA 134*  K 5.4*  CL 96*  CO2 28  GLUCOSE 219*  BUN 54*  CREATININE 1.76*  CALCIUM 9.1   ------------------------------------------------------------------------------------------------------------------ CrCl cannot be calculated (Unknown ideal weight.). ------------------------------------------------------------------------------------------------------------------ No results for input(s): HGBA1C in the last 72  hours. ------------------------------------------------------------------------------------------------------------------ No results for input(s): CHOL, HDL, LDLCALC, TRIG, CHOLHDL, LDLDIRECT in the last 72 hours. ------------------------------------------------------------------------------------------------------------------ No results for input(s): TSH, T4TOTAL, T3FREE, THYROIDAB in the last 72 hours.  Invalid input(s): FREET3 ------------------------------------------------------------------------------------------------------------------ No results for input(s): VITAMINB12, FOLATE, FERRITIN, TIBC, IRON, RETICCTPCT in the last 72 hours.  Coagulation profile No results for input(s): INR, PROTIME in the last 168 hours.  No results for input(s): DDIMER in the last 72 hours.  Cardiac Enzymes No results for input(s): CKMB, TROPONINI, MYOGLOBIN in the last 168 hours.  Invalid input(s): CK ------------------------------------------------------------------------------------------------------------------ Invalid input(s): POCBNP   CBG:  Recent Labs Lab 04/04/15 2033  GLUCAP 204*       EKG: Independently reviewed. Sinus rhythm with signs of an old anterior infarct.   Assessment/Plan  Cellulitis: Acute. 24 hours redness and erythema noted by the patient. X-ray of the right foot show signs of chronic osteomyelitis. - Admit to a MedSurg bed - continued Empiric antibiotics of vancomycin and Zosyn started in ED  - IV fluids and normal saline - Check ESR, CRP - Check repeat CBC and BMP in a.m. - Carb/ healthy diet   - Called orthopedics at around 10 PM, but there was not called back - Retry orthopedics in a.m. Patient's previous procedure was done by Dr. Ninfa Linden  chronic osteomyelitis of the right foot  - Follow-up recommendations of orthopedics  Hyperkalemia: Potassium on admission was 5.4   - Gentle IV fluids at 75 mL/hour - Recheck potassium in a.m.  Diabetes mellitus  type 2 with hyperglycemia : On admission patient's blood glucose is elevated to 219. - CBGs every before meals and at bedtime - Decreased home Lantus dose to 30 units every morning - Sensitivity sliding-scale insulin with meals - Hypoglycemic protocols  Atrial flutter history  - Continue Coreg, isosorbide mononitrate, Cardizem  - Held lasix dose  Chronic kidney disease stage IV: Creatinine on admission 1.76 that appears close to patient's baseline which range from 1.6-2. BUN appears chronically elevated - Continue to monitor   Hypothyroidism - Continue home levothyroxine dose   History of CVA with residual expressive aphasia: Stable.   Hyponatremia : Mild. Sodium 134 on admission  -Continue to monitor     Code Status:   full Family Communication: bedside Disposition Plan: admit   Total time spent 55 minutes.Greater than 50% of this time was spent in counseling, explanation of diagnosis, planning of further management, and coordination of care  Hall [name] Pager [phone]  If 7PM-7AM, please contact night-coverage [email or web address] Password [facility] 04/04/2015, 9:57 PM

## 2015-04-04 NOTE — ED Provider Notes (Signed)
CSN: [id number]     Arrival date & time 04/04/15  1729 History   First MD Initiated Contact with Patient 04/04/15 2006     Chief Complaint  Patient presents with  . Foot Ulcer     (Consider location/radiation/quality/duration/timing/severity/associated sxs/prior Treatment) HPI Patient reports that the wound on her foot has gotten worse with redness that seemed to develop overnight. She reports she's had to have other toes amputated due to her diabetes. She reports she can't feel pain in her feet. She denied general constitutional symptoms. She reports she feels fine. She says she is worried however because the redness advanced very quickly. Past Medical History  Diagnosis Date  . Myocardial infarction ([place])   . Coronary artery disease   . Hypertension   . Dysrhythmia   . Peripheral vascular disease ([place])   . Hypothyroidism   . Diabetes mellitus   . Presbyesophagus 04/23/2011  . Urinary retention with incomplete bladder emptying 04/23/2011  . UTI (lower urinary tract infection) 04/21/2011  . Diastolic CHF, chronic (HCC)     Grade I diastolic dysfunction on Echo 04/26/11  . Anaphylaxis 04/25/2011    Thought to be from Rocephin  . History of atrial flutter     ablation  . C. difficile colitis 04/17/2012  . Atrial flutter ([place]) 11/01/2012  . HCAP (healthcare-associated pneumonia) 10/21/2014  . Stroke [facility]) 2003    "lost the use of all of my right side"  . HOH (hard of hearing)    Past Surgical History  Procedure Laterality Date  . Cholecystectomy    . Colonoscopy  01/15/2011    Procedure: COLONOSCOPY;  Surgeon: Beryle Beams, MD;  [facility];  Service: Endoscopy;  Laterality: N/A;  . Amputation Right 04/14/2012    Procedure: AMPUTATION RAY;  Surgeon: Newt Minion, MD;  [facility];  Service: Orthopedics;  Laterality: Right;  ankle block  . Cardiac electrophysiology mapping and ablation    . Nm myocar perf wall motion  01/23/2009    mild ischemia mid inferior &  apical inferior.  EF 75%  . Amputation Right 08/10/2014    Procedure: AMPUTATION RAY;  Surgeon: Mcarthur Rossetti, MD;  [facility];  Service: Orthopedics;  Laterality: Right;  ankle block  . Appendectomy    . Shoulder surgery Right     "put back in place'  . Eye surgery Bilateral     Cararact  . I&d extremity Right 12/03/2014    Procedure: IRRIGATION AND DEBRIDEMENT RIGHT FOOT;  Surgeon: Mcarthur Rossetti, MD;  Location: [place];  Service: Orthopedics;  Laterality: Right;   Family History  Problem Relation Age of Onset  . Cancer Sister   . Diabetes Mother   . Heart disease Father    Social History  Substance Use Topics  . Smoking status: Former Smoker -- 0.10 packs/day for 40 years    Types: Cigarettes    Quit date: 04/20/1981  . Smokeless tobacco: Never Used  . Alcohol Use: Yes     Comment: hx social   OB History    No data available     Review of Systems  10 Systems reviewed and are negative for acute change except as noted in the HPI.   Allergies  Ephedrine and Morphine  Home Medications   Prior to Admission medications   Medication Sig Start Date End Date Taking? Authorizing Provider  aspirin 81 MG tablet Take 81 mg by mouth daily.   Yes Historical Provider, MD  benzonatate Lavella Lemons)  100 MG capsule Take 1 capsule (100 mg total) by mouth 3 (three) times daily as needed for cough. 10/25/14  Yes Donne Hazel, MD  carvedilol (COREG) 6.25 MG tablet Take 6.25 mg by mouth 2 (two) times daily with a meal.     Yes Historical Provider, MD  diltiazem (CARDIZEM CD) 180 MG 24 hr capsule Take 180 mg by mouth at bedtime.    Yes Historical Provider, MD  doxycycline (VIBRAMYCIN) 100 MG capsule Take 100 mg by mouth 2 (two) times daily.   Yes Historical Provider, MD  furosemide (LASIX) 20 MG tablet TAKE 1 TABLET BY MOUTH 2 TIMES DAILY.*PLEASE MAKE APPOINTMENT FOR REFILLS* 10/25/14  Yes Donne Hazel, MD  gabapentin (NEURONTIN) 300 MG capsule Take 300 mg by mouth 3  (three) times daily.  02/17/12  Yes Ivan Anchors Love, PA-C  insulin aspart (NOVOLOG) 100 UNIT/ML injection Inject 2-12 Units into the skin 3 (three) times daily before meals. <70(0 units), 70-140 (0 units),141-180 (2 units), 181-220(4 units), 221-260 (6 units), 261-300(8 units), 301-340(10 units), >340 (12 units) notify MD   Yes Historical Provider, MD  insulin glargine (LANTUS) 100 UNIT/ML injection Inject 0.4 mLs (40 Units total) into the skin every morning. 08/11/14  Yes Venetia Maxon Rama, MD  isosorbide mononitrate (IMDUR) 30 MG 24 hr tablet Take 30 mg by mouth at bedtime.    Yes Historical Provider, MD  levothyroxine (SYNTHROID, LEVOTHROID) 175 MCG tablet Take 175 mcg by mouth daily before breakfast.   Yes Historical Provider, MD  Multiple Vitamin (MULTIVITAMIN) tablet Take 1 tablet by mouth daily.   Yes Historical Provider, MD  nitroGLYCERIN (NITRODUR - DOSED IN MG/24 HR) 0.2 mg/hr patch Place 0.2 mg onto the skin daily. Reported on 04/04/2015 03/12/15  Yes Historical Provider, MD  nutrition supplement, JUVEN, (JUVEN) PACK Take 1 packet by mouth 2 (two) times daily with a meal. 08/11/14  Yes Venetia Maxon Rama, MD  Tamsulosin HCl (FLOMAX) 0.4 MG CAPS Take 0.4 mg by mouth every morning.    Yes Historical Provider, MD  VITAMIN E PO Take 1 capsule by mouth daily.   Yes Historical Provider, MD   BP 100/37 mmHg  Pulse 61  Temp(Src) 97.7 F (36.5 C) (Oral)  Resp 16  Ht 5\' 6"  (1.676 m)  Wt 163 lb 11.2 oz (74.254 kg)  BMI 26.43 kg/m2  SpO2 94% Physical Exam  Constitutional: She is oriented to person, place, and time. She appears well-developed and well-nourished.  HENT:  Head: Normocephalic and atraumatic.  Eyes: EOM are normal. Pupils are equal, round, and reactive to light.  Neck: Neck supple.  Cardiovascular: Normal rate, regular rhythm, normal heart sounds and intact distal pulses.   Pulmonary/Chest: Effort normal and breath sounds normal.  Abdominal: Soft. Bowel sounds are normal. She exhibits no  distension. There is no tenderness.  Musculoskeletal: Normal range of motion. She exhibits edema.  Neurological: She is alert and oriented to person, place, and time. She has normal strength. Coordination normal. GCS eye subscore is 4. GCS verbal subscore is 5. GCS motor subscore is 6.  Skin: Skin is warm, dry and intact.  Psychiatric: She has a normal mood and affect.        ED Course  Procedures (including critical care time) Labs Review Labs Reviewed  CBC WITH DIFFERENTIAL/PLATELET - Abnormal; Notable for the following:    Hemoglobin 11.8 (*)    All other components within normal limits  BASIC METABOLIC PANEL - Abnormal; Notable for the following:  Sodium 134 (*)    Potassium 5.4 (*)    Chloride 96 (*)    Glucose, Bld 219 (*)    BUN 54 (*)    Creatinine, Ser 1.76 (*)    GFR calc non Af Amer 24 (*)    GFR calc Af Amer 28 (*)    All other components within normal limits  BASIC METABOLIC PANEL - Abnormal; Notable for the following:    Glucose, Bld 250 (*)    BUN 53 (*)    Creatinine, Ser 1.72 (*)    Calcium 8.6 (*)    GFR calc non Af Amer 24 (*)    GFR calc Af Amer 28 (*)    All other components within normal limits  PREALBUMIN - Abnormal; Notable for the following:    Prealbumin 12.7 (*)    All other components within normal limits  CBC - Abnormal; Notable for the following:    Hemoglobin 10.6 (*)    HCT 33.4 (*)    All other components within normal limits  C-REACTIVE PROTEIN - Abnormal; Notable for the following:    CRP 3.3 (*)    All other components within normal limits  SEDIMENTATION RATE - Abnormal; Notable for the following:    Sed Rate 69 (*)    All other components within normal limits  GLUCOSE, CAPILLARY - Abnormal; Notable for the following:    Glucose-Capillary 205 (*)    All other components within normal limits  GLUCOSE, CAPILLARY - Abnormal; Notable for the following:    Glucose-Capillary 311 (*)    All other components within normal limits    GLUCOSE, CAPILLARY - Abnormal; Notable for the following:    Glucose-Capillary 231 (*)    All other components within normal limits  GLUCOSE, CAPILLARY - Abnormal; Notable for the following:    Glucose-Capillary 275 (*)    All other components within normal limits  GLUCOSE, CAPILLARY - Abnormal; Notable for the following:    Glucose-Capillary 138 (*)    All other components within normal limits  CBG MONITORING, ED - Abnormal; Notable for the following:    Glucose-Capillary 204 (*)    All other components within normal limits  CBG MONITORING, ED - Abnormal; Notable for the following:    Glucose-Capillary 235 (*)    All other components within normal limits  CULTURE, BLOOD (ROUTINE X 2)  CULTURE, BLOOD (ROUTINE X 2)  SURGICAL PCR SCREEN    Imaging Review Dg Foot Complete Right  04/04/2015  CLINICAL DATA:  Diabetic foot wound. EXAM: RIGHT FOOT COMPLETE - 3+ VIEW COMPARISON:  08/08/2014 FINDINGS: Interval amputation of the great toe at the level of the distal metatarsal. There is erosive change of the medial cortex of the mid and proximal first metatarsal suggesting osteomyelitis. There is sclerotic periosteal reaction in this area consistent with chronic bony infection. Chronic fracture of the third metatarsal is unchanged. Prior amputation of the fourth toe is unchanged without evidence of osteomyelitis. IMPRESSION: Findings compatible with chronic osteomyelitis of the first metatarsal. Electronically Signed   By: Franchot Gallo M.D.   On: 04/04/2015 18:24   I have personally reviewed and evaluated these images and lab results as part of my medical decision-making.   EKG Interpretation   Date/Time:  Friday April 04 2015 22:29:51 EST Ventricular Rate:  56 PR Interval:  204 QRS Duration: 97 QT Interval:  486 QTC Calculation: 469 R Axis:   81 Text Interpretation:  Sinus rhythm Anterior infarct, old Baseline wander  in  lead(s) V1 V3 ED PHYSICIAN INTERPRETATION AVAILABLE IN [place]  [facility]  Confirmed by TEST, Record ([id number]) on 04/05/2015 10:09:23 AM      MDM   Final diagnoses:  Diabetic foot infection ([place])   Patient has advancing diabetic foot wound with associated cellulitis. She'll be admitted for inpatient treatment with antibiotics and surgical consult. At this time she is nontoxic and alert. Her mental status is good. No respiratory distress.    Charlesetta Shanks, MD 04/06/15 1340

## 2015-04-04 NOTE — ED Notes (Signed)
Pt has been treated for 2 wounds on her R foot (one 1" wound on medial side of foot, and the other is on her toe). Pt has been to wound center and orthopedics for same. Pt told her daughter she wanted to come in for evaluation. Pt reports wound looks worse today.

## 2015-04-05 DIAGNOSIS — E871 Hypo-osmolality and hyponatremia: Secondary | ICD-10-CM | POA: Diagnosis present

## 2015-04-05 DIAGNOSIS — Z8673 Personal history of transient ischemic attack (TIA), and cerebral infarction without residual deficits: Secondary | ICD-10-CM | POA: Diagnosis present

## 2015-04-05 DIAGNOSIS — E875 Hyperkalemia: Secondary | ICD-10-CM | POA: Diagnosis present

## 2015-04-05 DIAGNOSIS — M8660 Other chronic osteomyelitis, unspecified site: Secondary | ICD-10-CM

## 2015-04-05 DIAGNOSIS — L03115 Cellulitis of right lower limb: Secondary | ICD-10-CM

## 2015-04-05 LAB — CBC
HEMATOCRIT: 33.4 % — AB (ref 36.0–46.0)
HEMOGLOBIN: 10.6 g/dL — AB (ref 12.0–15.0)
MCH: 27.2 pg (ref 26.0–34.0)
MCHC: 31.7 g/dL (ref 30.0–36.0)
MCV: 85.9 fL (ref 78.0–100.0)
PLATELETS: 271 10*3/uL (ref 150–400)
RBC: 3.89 MIL/uL (ref 3.87–5.11)
RDW: 14 % (ref 11.5–15.5)
WBC: 8.8 10*3/uL (ref 4.0–10.5)

## 2015-04-05 LAB — C-REACTIVE PROTEIN: CRP: 3.3 mg/dL — ABNORMAL HIGH (ref <1.0)

## 2015-04-05 LAB — BASIC METABOLIC PANEL
Anion gap: 10 (ref 5–15)
BUN: 53 mg/dL — ABNORMAL HIGH (ref 6–20)
CALCIUM: 8.6 mg/dL — AB (ref 8.9–10.3)
CO2: 25 mmol/L (ref 22–32)
Chloride: 101 mmol/L (ref 101–111)
Creatinine, Ser: 1.72 mg/dL — ABNORMAL HIGH (ref 0.44–1.00)
GFR, EST AFRICAN AMERICAN: 28 mL/min — AB (ref >60)
GFR, EST NON AFRICAN AMERICAN: 24 mL/min — AB (ref >60)
Glucose, Bld: 250 mg/dL — ABNORMAL HIGH (ref 65–99)
POTASSIUM: 4.6 mmol/L (ref 3.5–5.1)
Sodium: 136 mmol/L (ref 135–145)

## 2015-04-05 LAB — GLUCOSE, CAPILLARY
GLUCOSE-CAPILLARY: 311 mg/dL — AB (ref 65–99)
GLUCOSE-CAPILLARY: 275 mg/dL — AB (ref 65–99)
Glucose-Capillary: 205 mg/dL — ABNORMAL HIGH (ref 65–99)
Glucose-Capillary: 231 mg/dL — ABNORMAL HIGH (ref 65–99)

## 2015-04-05 LAB — SURGICAL PCR SCREEN
MRSA, PCR: NEGATIVE
STAPHYLOCOCCUS AUREUS: NEGATIVE

## 2015-04-05 LAB — PREALBUMIN: Prealbumin: 12.7 mg/dL — ABNORMAL LOW (ref 18–38)

## 2015-04-05 MED ORDER — INSULIN GLARGINE 100 UNIT/ML ~~LOC~~ SOLN
5.0000 [IU] | Freq: Once | SUBCUTANEOUS | Status: AC
  Administered 2015-04-05: 5 [IU] via SUBCUTANEOUS
  Filled 2015-04-05: qty 0.05

## 2015-04-05 MED ORDER — FUROSEMIDE 20 MG PO TABS
20.0000 mg | ORAL_TABLET | Freq: Two times a day (BID) | ORAL | Status: DC
  Administered 2015-04-05 – 2015-04-08 (×5): 20 mg via ORAL
  Filled 2015-04-05 (×5): qty 1

## 2015-04-05 MED ORDER — INSULIN GLARGINE 100 UNIT/ML ~~LOC~~ SOLN
10.0000 [IU] | Freq: Once | SUBCUTANEOUS | Status: DC
  Filled 2015-04-05: qty 0.1

## 2015-04-05 MED ORDER — INSULIN GLARGINE 100 UNIT/ML ~~LOC~~ SOLN
35.0000 [IU] | Freq: Every morning | SUBCUTANEOUS | Status: DC
  Administered 2015-04-06 – 2015-04-08 (×3): 35 [IU] via SUBCUTANEOUS
  Filled 2015-04-05 (×3): qty 0.35

## 2015-04-05 MED ORDER — INSULIN GLARGINE 100 UNIT/ML ~~LOC~~ SOLN: 40.0000 [IU] | Freq: Every morning | SUBCUTANEOUS | Status: DC

## 2015-04-05 MED ORDER — INSULIN ASPART 100 UNIT/ML ~~LOC~~ SOLN
0.0000 [IU] | Freq: Every day | SUBCUTANEOUS | Status: DC
  Administered 2015-04-05 – 2015-04-07 (×3): 3 [IU] via SUBCUTANEOUS

## 2015-04-05 MED ORDER — INSULIN ASPART 100 UNIT/ML ~~LOC~~ SOLN
0.0000 [IU] | Freq: Three times a day (TID) | SUBCUTANEOUS | Status: DC
  Administered 2015-04-05 – 2015-04-06 (×2): 3 [IU] via SUBCUTANEOUS
  Administered 2015-04-06 – 2015-04-07 (×2): 5 [IU] via SUBCUTANEOUS
  Administered 2015-04-07: 1 [IU] via SUBCUTANEOUS
  Administered 2015-04-07: 5 [IU] via SUBCUTANEOUS
  Administered 2015-04-08: 2 [IU] via SUBCUTANEOUS

## 2015-04-05 NOTE — Anesthesia Preprocedure Evaluation (Addendum)
Anesthesia Evaluation  Patient identified by MRN, date of birth, ID band Patient awake    Reviewed: Allergy & Precautions, NPO status , Patient's Chart, lab work & pertinent test results  History of Anesthesia Complications Negative for: history of anesthetic complications  Airway Mallampati: III  TM Distance: >3 FB Neck ROM: Full    Dental no notable dental hx. (+) Dental Advisory Given, Edentulous Upper, Edentulous Lower, Upper Dentures, Lower Dentures   Pulmonary former smoker,    Pulmonary exam normal breath sounds clear to auscultation       Cardiovascular hypertension, Pt. on medications and Pt. on home beta blockers + CAD, + Past MI, + Peripheral Vascular Disease and +CHF  Normal cardiovascular exam+ dysrhythmias Atrial Fibrillation  Rhythm:Regular Rate:Normal     Neuro/Psych CVA    GI/Hepatic   Endo/Other  diabetes, Insulin DependentHypothyroidism   Renal/GU Renal disease     Musculoskeletal   Abdominal   Peds  Hematology   Anesthesia Other Findings   Reproductive/Obstetrics                             Anesthesia Physical  Anesthesia Plan  ASA: III  Anesthesia Plan: MAC and Regional   Post-op Pain Management:    Induction: Intravenous  Airway Management Planned: Simple Face Mask  Additional Equipment:   Intra-op Plan:   Post-operative Plan:   Informed Consent: I have reviewed the patients History and Physical, chart, labs and discussed the procedure including the risks, benefits and alternatives for the proposed anesthesia with the patient or authorized representative who has indicated his/her understanding and acceptance.   Dental advisory given  Plan Discussed with: CRNA  Anesthesia Plan Comments:         Anesthesia Quick Evaluation

## 2015-04-05 NOTE — Progress Notes (Signed)
Utilization review completed.  

## 2015-04-05 NOTE — Progress Notes (Addendum)
PROGRESS NOTE    [name]  [id number]  DOB: 14-Dec-1919  DOA: 04/04/2015 PCP: Jani Gravel, MD Outpatient Specialists:   Hospital course: 82 year old female patient with history of DM 2 with peripheral neuropathy, HTN, CAD, CVA with residual aphasia, atrial flutter status post ablation, PAD, hypothyroid, chronic diastolic CHF, s/p prior right first and second toe amputation, chronic right foot wounds, presented to ED with worsening of her right foot wound. Night prior to admission, she noticed right great toe with worsening swelling and developing black/brown area. Patient lives alone and utilizes a wheelchair to get around her home. She reports having a aide that comes in to see her for 4 hours a day and helps make sure that she has meals prepared. She was admitted for cellulitis complicating chronic right foot wounds and chronic osteomyelitis. Orthopedics consulted and plan for right transmetatarsal amputation on 3/12.   Assessment & Plan:   Cellulitis of right foot/chronic right foot wounds/chronic osteomyelitis - complicating chronic right third toe ulcer with chronic osteomyelitis  - Empirically started on IV vancomycin and Zosyn  - Discussed with and appreciate Dr. Ninfa Linden, orthopedic input-plan for transmetatarsal amputation on 3/12  - Based on available data, patient is at high risk for perioperative CV events but comorbidities are being optimized as best possible and may proceed with indicated surgery with close perioperative monitoring and without any further CV workup.  Mild hyperkalemia - Resolved after brief IV fluid hydration  Type II DM with renal complications - Uncontrolled with CBGs ranging in the 204-311 range. Increase Lantus to prior home dose and continue NovoLog SSI. - Likely precipitated by acute infection.  Atrial flutter - Seems to be in sinus rhythm clinically. Continue carvedilol and Cardizem.  Stage IV chronic kidney disease - Baseline  creatinine ranging between 1.6-2. Creatinine hence at baseline.  Hypothyroid - Continue Synthroid  CVA with residual expressive aphasia - Probably not a anticoagulation candidate secondary to advanced age and fall risk.  Hyponatremia - Resolved.  Essential hypertension - Controlled. Continue carvedilol, Cardizem and Imdur.  Chronic diastolic CHF - Compensated. Resume Lasix    DVT prophylaxis: Cutaneous heparin Code Status: Full Family Communication: Discussed extensively with patient's daughter at bedside. Updated care and answered questions.  Disposition Plan: To be determined.   Consultants:  Orthopedics  Procedures:  None  Antimicrobials:  IV Zosyn 3/10  IV Vancomycin 3/10  Subjective: Denies complaints. Denies pain.   Objective: Filed Vitals:   04/04/15 2259 04/05/15 0454 04/05/15 0500 04/05/15 1415  BP: 121/50 117/57  131/48  Pulse: 57 60  69  Temp: 97.9 F (36.6 C) 97.8 F (36.6 C)  98.1 F (36.7 C)  TempSrc: Oral Oral  Oral  Resp: 16 16  16   Height: 5\' 6"  (1.676 m)     Weight:   74.254 kg (163 lb 11.2 oz)   SpO2: 94% 94%  92%    Intake/Output Summary (Last 24 hours) at 04/05/15 1425 Last data filed at 04/05/15 1000  Gross per 24 hour  Intake    480 ml  Output    350 ml  Net    130 ml   Filed Weights   04/05/15 0500  Weight: 74.254 kg (163 lb 11.2 oz)    Exam:  General exam: Pleasant elderly female lying comfortably propped up in bed.  Respiratory system: Clear. No increased work of breathing. Cardiovascular system: S1 & S2 heard, RRR. No JVD, murmurs, gallops, clicks or pedal edema. Gastrointestinal system: Abdomen is nondistended, soft  and nontender. Normal bowel sounds heard. Central nervous system: Alert and oriented. No focal neurological deficits. Extremities: Symmetric 5 x 5 power. Amputated right first and second toes. Large necrotic ulcer on right third toe with mild surrounding erythema but no tenderness, fluctuance or  purulent drainage. Clean ulcer on the medial aspect of the right forefoot. Please see picture below for details   Data Reviewed: Basic Metabolic Panel:  Recent Labs Lab 04/06/15 1915 04/05/15 0419  NA 134* 136  K 5.4* 4.6  CL 96* 101  CO2 28 25  GLUCOSE 219* 250*  BUN 54* 53*  CREATININE 1.76* 1.72*  CALCIUM 9.1 8.6*   Liver Function Tests: No results for input(s): AST, ALT, ALKPHOS, BILITOT, PROT, ALBUMIN in the last 168 hours. No results for input(s): LIPASE, AMYLASE in the last 168 hours. No results for input(s): AMMONIA in the last 168 hours. CBC:  Recent Labs Lab 2015/04/06 1915 04/05/15 0419  WBC 10.2 8.8  NEUTROABS 6.8  --   HGB 11.8* 10.6*  HCT 36.1 33.4*  MCV 82.6 85.9  PLT 306 271   Cardiac Enzymes: No results for input(s): CKTOTAL, CKMB, CKMBINDEX, TROPONINI in the last 168 hours. BNP (last 3 results) No results for input(s): PROBNP in the last 8760 hours. CBG:  Recent Labs Lab 2015/04/06 2033 04-06-2015 2225 04/05/15 0804 04/05/15 1149  GLUCAP 204* 235* 205* 311*    Recent Results (from the past 240 hour(s))  Surgical pcr screen     Status: None   Collection Time: 04/05/15 12:15 PM  Result Value Ref Range Status   MRSA, PCR NEGATIVE NEGATIVE Final   Staphylococcus aureus NEGATIVE NEGATIVE Final    Comment:        The Xpert SA Assay (FDA approved for NASAL specimens in patients over 24 years of age), is one component of a comprehensive surveillance program.  Test performance has been validated by [facility] for patients greater than or equal to 38 year old. It is not intended to diagnose infection nor to guide or monitor treatment.          Studies: Dg Foot Complete Right  04/06/15  CLINICAL DATA:  Diabetic foot wound. EXAM: RIGHT FOOT COMPLETE - 3+ VIEW COMPARISON:  08/08/2014 FINDINGS: Interval amputation of the great toe at the level of the distal metatarsal. There is erosive change of the medial cortex of the mid and proximal  first metatarsal suggesting osteomyelitis. There is sclerotic periosteal reaction in this area consistent with chronic bony infection. Chronic fracture of the third metatarsal is unchanged. Prior amputation of the fourth toe is unchanged without evidence of osteomyelitis. IMPRESSION: Findings compatible with chronic osteomyelitis of the first metatarsal. Electronically Signed   By: Franchot Gallo M.D.   On: 04/06/2015 18:24        Scheduled Meds: . aspirin EC  81 mg Oral Daily  . carvedilol  6.25 mg Oral BID WC  . diltiazem  180 mg Oral QHS  . gabapentin  300 mg Oral BID  . heparin  5,000 Units Subcutaneous 3 times per day  . insulin aspart  0-9 Units Subcutaneous TID WC  . insulin glargine  30 Units Subcutaneous q morning - 10a  . isosorbide mononitrate  30 mg Oral QHS  . levothyroxine  175 mcg Oral QAC breakfast  . nutrition supplement (JUVEN)  1 packet Oral BID WC  . piperacillin-tazobactam (ZOSYN)  IV  2.25 g Intravenous Q8H  . tamsulosin  0.4 mg Oral q morning - 10a  .  vancomycin  500 mg Intravenous Q24H   Continuous Infusions:   Principal Problem:   Cellulitis Active Problems:   Hypothyroidism   Chronic kidney disease (CKD), stage IV (severe) (HCC)   Hyperkalemia   Hyponatremia   Expressive aphasia   Chronic osteomyelitis ([place])    Time spent: 45 minutes.    Vernell Leep, MD, FACP, FHM. [facility] Pager [phone]  If 7PM-7AM, please contact night-coverage [email or web address] Password [email or web address] 04/05/2015, 2:25 PM    LOS: 1 day

## 2015-04-05 NOTE — Progress Notes (Signed)
Patient ID: [name], female   DOB: Dec 04, 1919, 82 y.o.   MRN: [id number] Ms. Jenning is well-known to me and our practice.  She has been dealing with chronic right foot wounds for a long time now.  She has had a first ray resection and I&D's of this due to poor healing and has had a second toe amputation.  At this point, due to a necrotic 3rd toe and continued chronic foot wounds, we are recommending a right foot transmetatarsal amputation.  I have spoke to her in length about this as well as her daughter.  Will plan on surgery tomorrow 3/12 in the am.  I have communicated this with the primary service ([facility]) as well.

## 2015-04-05 NOTE — Progress Notes (Signed)
CSW received referral for Access Meds for Discharge.   Inappropriate CSW referral.  Please consult case management for medications needs.   CSW signing off.   Please re-consult if social work needs arise.   Alison Murray, MSW, [place] [phone]

## 2015-04-06 ENCOUNTER — Encounter (HOSPITAL_COMMUNITY): Payer: Self-pay | Admitting: Anesthesiology

## 2015-04-06 ENCOUNTER — Inpatient Hospital Stay (HOSPITAL_COMMUNITY): Payer: Medicare Other | Admitting: Anesthesiology

## 2015-04-06 ENCOUNTER — Encounter (HOSPITAL_COMMUNITY)
Admission: EM | Disposition: A | Discharge status: Skilled Nursing Facility | Payer: Self-pay | Source: Home / Self Care | Attending: Internal Medicine

## 2015-04-06 DIAGNOSIS — E1165 Type 2 diabetes mellitus with hyperglycemia: Secondary | ICD-10-CM

## 2015-04-06 DIAGNOSIS — Z794 Long term (current) use of insulin: Secondary | ICD-10-CM

## 2015-04-06 HISTORY — PX: AMPUTATION: SHX166

## 2015-04-06 LAB — GLUCOSE, CAPILLARY
GLUCOSE-CAPILLARY: 273 mg/dL — AB (ref 65–99)
GLUCOSE-CAPILLARY: 227 mg/dL — AB (ref 65–99)
Glucose-Capillary: 138 mg/dL — ABNORMAL HIGH (ref 65–99)
Glucose-Capillary: 281 mg/dL — ABNORMAL HIGH (ref 65–99)

## 2015-04-06 SURGERY — AMPUTATION, FOOT, PARTIAL
Anesthesia: Monitor Anesthesia Care | Laterality: Right

## 2015-04-06 MED ORDER — MIDAZOLAM HCL 2 MG/2ML IJ SOLN
INTRAMUSCULAR | Status: AC
  Filled 2015-04-06: qty 2

## 2015-04-06 MED ORDER — BUPIVACAINE-EPINEPHRINE (PF) 0.5% -1:200000 IJ SOLN
INTRAMUSCULAR | Status: AC
  Filled 2015-04-06: qty 30

## 2015-04-06 MED ORDER — MEPERIDINE HCL 50 MG/ML IJ SOLN: 6.2500 mg | INTRAMUSCULAR | Status: DC | PRN

## 2015-04-06 MED ORDER — FENTANYL CITRATE (PF) 100 MCG/2ML IJ SOLN
25.0000 ug | INTRAMUSCULAR | Status: DC | PRN
Start: 2015-04-06 — End: 2015-04-06

## 2015-04-06 MED ORDER — SODIUM CHLORIDE 0.9 % IV SOLN
INTRAVENOUS | Status: DC | PRN
  Administered 2015-04-06: 07:00:00 via INTRAVENOUS

## 2015-04-06 MED ORDER — FENTANYL CITRATE (PF) 100 MCG/2ML IJ SOLN
INTRAMUSCULAR | Status: DC | PRN
  Administered 2015-04-06: 25 ug via INTRAVENOUS

## 2015-04-06 MED ORDER — PHENYLEPHRINE 40 MCG/ML (10ML) SYRINGE FOR IV PUSH (FOR BLOOD PRESSURE SUPPORT)
PREFILLED_SYRINGE | INTRAVENOUS | Status: AC
  Filled 2015-04-06: qty 10

## 2015-04-06 MED ORDER — PROPOFOL 500 MG/50ML IV EMUL
INTRAVENOUS | Status: DC | PRN
  Administered 2015-04-06: 25 ug/kg/min via INTRAVENOUS

## 2015-04-06 MED ORDER — LIDOCAINE-EPINEPHRINE (PF) 2 %-1:200000 IJ SOLN
INTRAMUSCULAR | Status: DC | PRN
  Administered 2015-04-06: 10 mL via PERINEURAL

## 2015-04-06 MED ORDER — LIDOCAINE-EPINEPHRINE (PF) 2 %-1:200000 IJ SOLN
INTRAMUSCULAR | Status: AC
  Filled 2015-04-06: qty 20

## 2015-04-06 MED ORDER — MIDAZOLAM HCL 5 MG/5ML IJ SOLN
INTRAMUSCULAR | Status: DC | PRN
  Administered 2015-04-06: 0.5 mg via INTRAVENOUS

## 2015-04-06 MED ORDER — PROMETHAZINE HCL 25 MG/ML IJ SOLN: 6.2500 mg | INTRAMUSCULAR | Status: DC | PRN

## 2015-04-06 MED ORDER — FENTANYL CITRATE (PF) 100 MCG/2ML IJ SOLN
INTRAMUSCULAR | Status: AC
  Filled 2015-04-06: qty 2

## 2015-04-06 MED ORDER — PHENYLEPHRINE HCL 10 MG/ML IJ SOLN
INTRAMUSCULAR | Status: DC | PRN
  Administered 2015-04-06 (×3): 80 ug via INTRAVENOUS

## 2015-04-06 MED ORDER — PROPOFOL 10 MG/ML IV BOLUS
INTRAVENOUS | Status: AC
  Filled 2015-04-06: qty 40

## 2015-04-06 MED ORDER — BUPIVACAINE-EPINEPHRINE (PF) 0.5% -1:200000 IJ SOLN
INTRAMUSCULAR | Status: DC | PRN
  Administered 2015-04-06: 20 mL via PERINEURAL

## 2015-04-06 SURGICAL SUPPLY — 19 items
BLADE OSCILLATING/SAGITTAL (BLADE) ×3
BLADE SW THK.38XMED LNG THN (BLADE) IMPLANT
BNDG CMPR 9X4 STRL LF SNTH (GAUZE/BANDAGES/DRESSINGS) ×1
BNDG ESMARK 4X9 LF (GAUZE/BANDAGES/DRESSINGS) ×2 IMPLANT
GLOVE BIO SURGEON STRL SZ7.5 (GLOVE) ×11 IMPLANT
GLOVE BIOGEL PI IND STRL 8 (GLOVE) ×1 IMPLANT
GLOVE BIOGEL PI INDICATOR 8 (GLOVE) ×2
GLOVE ECLIPSE 8.0 STRL XLNG CF (GLOVE) ×3 IMPLANT
GOWN STRL REUS W/TWL XL LVL3 (GOWN DISPOSABLE) ×1 IMPLANT
KIT BASIN OR (CUSTOM PROCEDURE TRAY) ×2 IMPLANT
PACK ORTHO EXTREMITY (CUSTOM PROCEDURE TRAY) ×2 IMPLANT
POSITIONER SURGICAL ARM (MISCELLANEOUS) ×3 IMPLANT
SUT ETHILON 2 0 PS N (SUTURE) ×4 IMPLANT
SUT ETHILON 3 0 PS 1 (SUTURE) ×3 IMPLANT
SUT VIC AB 1 CT1 36 (SUTURE) ×2 IMPLANT
SUT VIC AB 2-0 CT1 27 (SUTURE) ×3
SUT VIC AB 2-0 CT1 TAPERPNT 27 (SUTURE) IMPLANT
TOWEL NATURAL 10PK STERILE (DISPOSABLE) ×2 IMPLANT
TOWEL OR 17X26 10 PK STRL BLUE (TOWEL DISPOSABLE) ×3 IMPLANT

## 2015-04-06 NOTE — Transfer of Care (Signed)
Immediate Anesthesia Transfer of Care Note  Patient: [name]  Procedure(s) Performed: Procedure(s): Transmetatarsal AMPUTATION RIGHT FOOT (Right)  Patient Location: PACU  Anesthesia Type:Regional  Level of Consciousness: awake  Airway & Oxygen Therapy: Patient Spontanous Breathing and Patient connected to face mask oxygen  Post-op Assessment: Report given to RN and Post -op Vital signs reviewed and stable  Post vital signs: Reviewed and stable  Last Vitals:  Filed Vitals:   04/05/15 2131 04/06/15 0540  BP: 141/101 132/47  Pulse: 76 63  Temp: 36.7 C 36.7 C  Resp: 16 16    Complications: No apparent anesthesia complications

## 2015-04-06 NOTE — Progress Notes (Signed)
Patient ID: [name], female   DOB: Oct 24, 1919, 82 y.o.   MRN: [id number] [name] understands that we are proceeding to the OR this am for a right foot transmetatarsal amputation.

## 2015-04-06 NOTE — Progress Notes (Signed)
Orthopedic Tech Progress Note Patient Details:  [name] 23-Dec-1919 [id number] Darco shoe was left with Pt because I was told by the RN to leave it in the room. Patient ID: [name], female   DOB: 1919-04-22, 82 y.o.   MRN: [id number]   Ladell Pier [facility] 04/06/2015, 5:04 PM

## 2015-04-06 NOTE — Brief Op Note (Signed)
04/04/2015 - 04/06/2015  8:49 AM  PATIENT:  [name]  82 y.o. female  PRE-OPERATIVE DIAGNOSIS:  necrosis right foot  POST-OPERATIVE DIAGNOSIS:  necrosis right foot   PROCEDURE:  Procedure(s): Transmetatarsal AMPUTATION RIGHT FOOT (Right)  SURGEON:  Surgeon(s) and Role:    * Mcarthur Rossetti, MD - Primary  PHYSICIAN ASSISTANT: Benita Stabile, PA-C  ANESTHESIA:   regional and IV sedation  EBL:  Total I/O In: -  Out: 400 [Blood:400]  COUNTS:  YES  TOURNIQUET:   Total Tourniquet Time Documented: Thigh (Right) - 34 minutes Total: Thigh (Right) - 34 minutes   DICTATION: .Other Dictation: Dictation Number [id number]  PLAN OF CARE: Admit to inpatient   PATIENT DISPOSITION:  PACU - hemodynamically stable.   Delay start of Pharmacological VTE agent (>24hrs) due to surgical blood loss or risk of bleeding: no

## 2015-04-06 NOTE — Progress Notes (Signed)
PROGRESS NOTE    [name]  [id number]  DOB: 11/19/1919  DOA: 04/04/2015 PCP: Jani Gravel, MD Outpatient Specialists:   Hospital course: 82 year old female patient with history of DM 2 with peripheral neuropathy, HTN, CAD, CVA with residual aphasia, atrial flutter status post ablation, PAD, hypothyroid, chronic diastolic CHF, s/p prior right first and second toe amputation, chronic right foot wounds, presented to ED with worsening of her right foot wound. Night prior to admission, she noticed right great toe with worsening swelling and developing black/brown area. Patient lives alone and utilizes a wheelchair to get around her home. She reports having a aide that comes in to see her for 4 hours a day and helps make sure that she has meals prepared. She was admitted for cellulitis complicating chronic right foot wounds and chronic osteomyelitis. Orthopedics consulted and s/p right transmetatarsal amputation on 3/12.   Assessment & Plan:   Cellulitis of right foot/chronic right foot wounds/chronic osteomyelitis, S/P transmetatarsal amputation of right foot [id number]  - complicating chronic right third toe ulcer with chronic osteomyelitis  - Empirically started on IV vancomycin and Zosyn  - Orthopedics was consulted and patient underwent transmetatarsal amputation of right foot on 3/12. Continue IV antibiotics for additional 24 hours and then consider discontinuing.  Mild hyperkalemia - Resolved after brief IV fluid hydration  Type II DM with renal complications - Uncontrolled with CBGs ranging in the 204-311 range on 3/11. Increased Lantus and continue NovoLog SSI. - Likely precipitated by acute infection. - Better. Monitor closely.  Atrial flutter - Seems to be in sinus rhythm clinically. Continue carvedilol and Cardizem. Probably not a candidate for anticoagulation due to advanced age and fall risk.  Stage IV chronic kidney disease - Baseline creatinine ranging between 1.6-2.  Creatinine hence at baseline.  Hypothyroid - Continue Synthroid  CVA with residual expressive aphasia - Probably not a anticoagulation candidate secondary to advanced age and fall risk.  Hyponatremia - Resolved.  Essential hypertension - Controlled. Continue carvedilol, Cardizem and Imdur.  Chronic diastolic CHF - Compensated. Resumed Lasix    DVT prophylaxis: Subcutaneous heparin Code Status: Full Family Communication: None at bedside today.  Disposition Plan: To be determined.   Consultants:  Orthopedics  Procedures:  Right foot transmetatarsal amputation 04/06/15.  Antimicrobials:  IV Zosyn 3/10  IV Vancomycin 3/10  Subjective: Patient seen postoperatively. Denied complaints. As per RN, no acute issues.  Objective: Filed Vitals:   04/06/15 0915 04/06/15 0930 04/06/15 0931 04/06/15 0945  BP: 108/39 97/17 116/49 100/63  Pulse: 58 62 60 58  Temp:  97.8 F (36.6 C)  97.6 F (36.4 C)  TempSrc:      Resp: 12 16 18 18   Height:      Weight:      SpO2: 97% 99% 100% 97%    Intake/Output Summary (Last 24 hours) at 04/06/15 1136 Last data filed at 04/06/15 0900  Gross per 24 hour  Intake   1685 ml  Output    400 ml  Net   1285 ml   Filed Weights   04/05/15 0500  Weight: 74.254 kg (163 lb 11.2 oz)    Exam:  General exam: Pleasant elderly female lying comfortably propped up in bed.  Respiratory system: Clear. No increased work of breathing. Cardiovascular system: S1 & S2 heard, RRR. No JVD, murmurs, gallops, clicks or pedal edema. Gastrointestinal system: Abdomen is nondistended, soft and nontender. Normal bowel sounds heard. Central nervous system: Alert and oriented. No focal neurological deficits. Extremities:  Symmetric 5 x 5 power. Right foot postop dressing site clean and dry. Please see picture below for pre op details.   Data Reviewed: Basic Metabolic Panel:  Recent Labs Lab 04-12-15 1915 04/05/15 0419  NA 134* 136  K 5.4* 4.6  CL 96*  101  CO2 28 25  GLUCOSE 219* 250*  BUN 54* 53*  CREATININE 1.76* 1.72*  CALCIUM 9.1 8.6*   Liver Function Tests: No results for input(s): AST, ALT, ALKPHOS, BILITOT, PROT, ALBUMIN in the last 168 hours. No results for input(s): LIPASE, AMYLASE in the last 168 hours. No results for input(s): AMMONIA in the last 168 hours. CBC:  Recent Labs Lab 12-Apr-2015 1915 04/05/15 0419  WBC 10.2 8.8  NEUTROABS 6.8  --   HGB 11.8* 10.6*  HCT 36.1 33.4*  MCV 82.6 85.9  PLT 306 271   Cardiac Enzymes: No results for input(s): CKTOTAL, CKMB, CKMBINDEX, TROPONINI in the last 168 hours. BNP (last 3 results) No results for input(s): PROBNP in the last 8760 hours. CBG:  Recent Labs Lab 04/05/15 0804 04/05/15 1149 04/05/15 1736 04/05/15 2136 04/06/15 0904  GLUCAP 205* 311* 231* 275* 138*    Recent Results (from the past 240 hour(s))  Blood Cultures x 2 sites     Status: None (Preliminary result)   Collection Time: 2015-04-12 10:10 PM  Result Value Ref Range Status   Specimen Description BLOOD BLOOD RIGHT FOREARM  Final   Special Requests BOTTLES DRAWN AEROBIC AND ANAEROBIC 5ML EA  Final   Culture   Final    NO GROWTH 2 DAYS Performed at [facility]    Report Status PENDING  Incomplete  Blood Cultures x 2 sites     Status: None (Preliminary result)   Collection Time: 2015/04/12 10:18 PM  Result Value Ref Range Status   Specimen Description BLOOD BLOOD LEFT FOREARM  Final   Special Requests BOTTLES DRAWN AEROBIC AND ANAEROBIC 5ML EA  Final   Culture   Final    NO GROWTH 2 DAYS Performed at [facility]    Report Status PENDING  Incomplete  Surgical pcr screen     Status: None   Collection Time: 04/05/15 12:15 PM  Result Value Ref Range Status   MRSA, PCR NEGATIVE NEGATIVE Final   Staphylococcus aureus NEGATIVE NEGATIVE Final    Comment:        The Xpert SA Assay (FDA approved for NASAL specimens in patients over 59 years of age), is one component of a  comprehensive surveillance program.  Test performance has been validated by [facility] for patients greater than or equal to 65 year old. It is not intended to diagnose infection nor to guide or monitor treatment.          Studies: Dg Foot Complete Right  2015-04-12  CLINICAL DATA:  Diabetic foot wound. EXAM: RIGHT FOOT COMPLETE - 3+ VIEW COMPARISON:  08/08/2014 FINDINGS: Interval amputation of the great toe at the level of the distal metatarsal. There is erosive change of the medial cortex of the mid and proximal first metatarsal suggesting osteomyelitis. There is sclerotic periosteal reaction in this area consistent with chronic bony infection. Chronic fracture of the third metatarsal is unchanged. Prior amputation of the fourth toe is unchanged without evidence of osteomyelitis. IMPRESSION: Findings compatible with chronic osteomyelitis of the first metatarsal. Electronically Signed   By: Franchot Gallo M.D.   On: 04/12/15 18:24        Scheduled Meds: . aspirin EC  81  mg Oral Daily  . carvedilol  6.25 mg Oral BID WC  . diltiazem  180 mg Oral QHS  . furosemide  20 mg Oral BID  . gabapentin  300 mg Oral BID  . heparin  5,000 Units Subcutaneous 3 times per day  . insulin aspart  0-5 Units Subcutaneous QHS  . insulin aspart  0-9 Units Subcutaneous TID WC  . insulin glargine  35 Units Subcutaneous q morning - 10a  . isosorbide mononitrate  30 mg Oral QHS  . levothyroxine  175 mcg Oral QAC breakfast  . nutrition supplement (JUVEN)  1 packet Oral BID WC  . piperacillin-tazobactam (ZOSYN)  IV  2.25 g Intravenous Q8H  . tamsulosin  0.4 mg Oral q morning - 10a  . vancomycin  500 mg Intravenous Q24H   Continuous Infusions:   Principal Problem:   Cellulitis Active Problems:   Hypothyroidism   Chronic kidney disease (CKD), stage IV (severe) (HCC)   Hyperkalemia   Hyponatremia   Expressive aphasia   Chronic osteomyelitis (HCC)    Time spent: 15  minutes.    Vernell Leep, MD, FACP, FHM. [facility] Pager [phone]  If 7PM-7AM, please contact night-coverage [email or web address] Password [email or web address] 04/06/2015, 11:36 AM    LOS: 2 days

## 2015-04-06 NOTE — Progress Notes (Signed)
Patient dentures(in clear white cup) and ring taken to room and placed in base of tall closet.

## 2015-04-06 NOTE — Anesthesia Procedure Notes (Signed)
Anesthesia Regional Block:  Popliteal block  Pre-Anesthetic Checklist: ,, timeout performed, Correct Patient, Correct Site, Correct Laterality, Correct Procedure, Correct Position, site marked, Risks and benefits discussed, Surgical consent, Pre-op evaluation  Laterality: Right  Prep: chloraprep       Needles:  Injection technique: Single-shot  Needle Type: Stimiplex     Needle Length: 9cm 9 cm Needle Gauge: 21 and 21 G    Additional Needles:  Procedures: ultrasound guided (picture in chart) and nerve stimulator Popliteal block  Nerve Stimulator or Paresthesia:  Response: Plantar flexion, 0.5 mA,   Additional Responses:   Narrative:  Injection made incrementally with aspirations every 5 mL.  Performed by: Personally  Anesthesiologist: Nolon Nations

## 2015-04-06 NOTE — Anesthesia Postprocedure Evaluation (Signed)
Anesthesia Post Note  Patient: [name]  Procedure(s) Performed: Procedure(s) (LRB): Transmetatarsal AMPUTATION RIGHT FOOT (Right)  Patient location during evaluation: PACU Anesthesia Type: MAC Level of consciousness: awake and alert Pain management: pain level controlled Vital Signs Assessment: post-procedure vital signs reviewed and stable Respiratory status: spontaneous breathing Cardiovascular status: stable Anesthetic complications: no    Last Vitals:  Filed Vitals:   04/06/15 0931 04/06/15 0945  BP: 116/49 100/63  Pulse: 60 58  Temp:  36.4 C  Resp: 18 18    Last Pain:  Filed Vitals:   04/06/15 1007  PainSc: 0-No pain                 Nolon Nations

## 2015-04-06 NOTE — Op Note (Signed)
NAMECARROLL, ISABELLA NO.:  [id number]  MEDICAL RECORD NO.:  [id number]  LOCATION:  [facility]                         FACILITY:  [facility]  PHYSICIAN:  Lind Guest. Ninfa Linden, M.D.DATE OF BIRTH:  Mar 03, 1919  DATE OF PROCEDURE:  04/06/2015 DATE OF DISCHARGE:                              OPERATIVE REPORT   POSTOPERATIVE DIAGNOSIS:  Right forefoot ischemia with necrosis and chronic wounds.  POSTOPERATIVE DIAGNOSIS:  Right forefoot ischemia with necrosis and chronic wounds.  PROCEDURE PERFORMED:  Right foot transmetatarsal amputation.  ASSISTANT:  Erskine Emery, PA-C.  ANESTHESIA: 1. Regional right lower extremity block. 2. Mask ventilation IV sedation.  BLOOD LOSS:  About 200 mL.  TOURNIQUET TIME:  34 minutes.  COMPLICATIONS:  None.  INDICATIONS:  The patient is a 82 year old, well known to me.  She has been dealing with chronic wounds on her right foot for sometime with severe peripheral neuropathy.  She has had a 1st ray amputation of the right foot as well as 2nd toe amputation.  These things have just not healed.  She has now gotten ischemic 3rd toe and chronic wounds with no evidence of infection at all.  At this point, her daughter is requesting an additional surgical intervention and I feel a transmetatarsal amputation as a potential for her healing wounds better than just another toe amputation.  DESCRIPTION OF PROCEDURE:  After informed consent was obtained, appropriate right foot was marked.  Anesthesia obtained with regional block.  She was then brought to the operating room, placed supine on the operating table.  Mask ventilation IV sedation was obtained.  A nonsterile tourniquet was placed around her upper right thigh and her right leg was prepped and draped with DuraPrep and sterile drapes.  Time- out was called.  She was identified as correct patient, correct right leg.  We then raised the leg and tourniquet was inflated to 250 mm of pressure.   We then made an elliptical incision around her previous wounds coming through the 1st ray all the way through the lateral aspect of her foot and had a good plantar flap.  We passed the forefoot off table.  She does not need to get the pathology due to the obvious gross ischemic changes.  We then cauterize veins and used oscillating saw to cut each metatarsal in a cascading format.  The 1st ray that we removed all the way pass the medial cuneiform due to debility to close the wound.  We then irrigated the soft tissue with normal saline solution. I was able to fashion a plantar flap up to dorsal to completely cover her soft tissue and closed this with interrupted 2-0 nylon loose format. Xeroform well-padded sterile dressing was applied.  She was taken to the recovery room in stable condition.  All final counts were correct. There were no complications noted.     Lind Guest. Ninfa Linden, M.D.     CYB/MEDQ  D:  04/06/2015  T:  04/06/2015  Job:  [id number]

## 2015-04-07 ENCOUNTER — Encounter (HOSPITAL_COMMUNITY): Payer: Self-pay | Admitting: Orthopaedic Surgery

## 2015-04-07 DIAGNOSIS — D62 Acute posthemorrhagic anemia: Secondary | ICD-10-CM

## 2015-04-07 LAB — BASIC METABOLIC PANEL
Anion gap: 7 (ref 5–15)
BUN: 55 mg/dL — AB (ref 4–21)
BUN: 55 mg/dL — ABNORMAL HIGH (ref 6–20)
CALCIUM: 8.1 mg/dL — AB (ref 8.9–10.3)
CO2: 27 mmol/L (ref 22–32)
CREATININE: 1.8 mg/dL — AB (ref 0.5–1.1)
CREATININE: 1.82 mg/dL — AB (ref 0.44–1.00)
Chloride: 102 mmol/L (ref 101–111)
GFR, EST AFRICAN AMERICAN: 26 mL/min — AB (ref >60)
GFR, EST NON AFRICAN AMERICAN: 23 mL/min — AB (ref >60)
Glucose, Bld: 243 mg/dL — ABNORMAL HIGH (ref 65–99)
Glucose: 243 mg/dL
POTASSIUM: 4.6 mmol/L (ref 3.4–5.3)
Potassium: 4.6 mmol/L (ref 3.4–5.3)
Potassium: 4.6 mmol/L (ref 3.5–5.1)
SODIUM: 136 mmol/L — AB (ref 137–147)
SODIUM: 136 mmol/L (ref 135–145)

## 2015-04-07 LAB — CBC
HCT: 28.4 % — ABNORMAL LOW (ref 36.0–46.0)
Hemoglobin: 9.2 g/dL — ABNORMAL LOW (ref 12.0–15.0)
MCH: 26.4 pg (ref 26.0–34.0)
MCHC: 32.4 g/dL (ref 30.0–36.0)
MCV: 81.6 fL (ref 78.0–100.0)
PLATELETS: 296 10*3/uL (ref 150–400)
RBC: 3.48 MIL/uL — AB (ref 3.87–5.11)
RDW: 13.9 % (ref 11.5–15.5)
WBC: 10.7 10*3/uL — ABNORMAL HIGH (ref 4.0–10.5)

## 2015-04-07 LAB — GLUCOSE, CAPILLARY
GLUCOSE-CAPILLARY: 135 mg/dL — AB (ref 65–99)
Glucose-Capillary: 155 mg/dL — ABNORMAL HIGH (ref 65–99)
Glucose-Capillary: 291 mg/dL — ABNORMAL HIGH (ref 65–99)
Glucose-Capillary: 286 mg/dL — ABNORMAL HIGH (ref 65–99)
Glucose-Capillary: 293 mg/dL — ABNORMAL HIGH (ref 65–99)

## 2015-04-07 NOTE — Discharge Instructions (Signed)
Can put weight on right heel in post-op shoe. Keep current dressing on, clean, and dry until outpatient follow-up

## 2015-04-07 NOTE — Clinical Social Work Note (Signed)
Clinical Social Work Assessment  Patient Details  Name: [name] MRN: [id number] Date of Birth: September 26, 1919  Date of referral:  04/07/15               Reason for consult:  Discharge Planning                Permission sought to share information with:  Family Supports Permission granted to share information::  Yes, Verbal Permission Granted  Name::     [name]  Agency::     Relationship::  daughter  Contact Information:  [phone]  Housing/Transportation Living arrangements for the past 2 months:  [place] of Information:  Patient, Adult Children Patient Interpreter Needed:  None Criminal Activity/Legal Involvement Pertinent to Current Situation/Hospitalization:  No - Comment as needed Significant Relationships:  Adult Children Lives with:  Self Do you feel safe going back to the place where you live?  No Need for family participation in patient care:  Yes (Comment) (pt daughter at bedside)  Care giving concerns:  Pt admitted from home alone. PT recommending SNF for rehab.   Social Worker assessment / plan:    CSW received referral for New SNF.   CSW met with pt and pt daughter at bedside. CSW introduced self and explained role. CSW discussed recommendation for rehab at [facility]. Pt and pt daughter expressed understanding. Pt agreeable to [facility] search, but preference is [facility]. Pt reports that she has been to [facility] almost a dozen times in the past. Pt daughter states pt has been very satisfied with [facility] during pt rehab stays and hopeful they are available.   CSW completed FL2 and initiated SNF search to [place] left message with [facility] re: pt interest. Awaiting response.   Pt insurance [place] requires authorization prior to pt d/c to SNF. CSW to initiate authorization once bed chosen.  CSW to continue to follow to provide support and assist with pt discharge planning needs.   Employment status:   Retired Nurse, adult PT Recommendations:  [place] / Referral to community resources:  [place]  Patient/Family's Response to care:  Pt alert and oriented x 4. Pt daughter supportive and actively involved in pt care. Pt hopeful for [facility].   Patient/Family's Understanding of and Emotional Response to Diagnosis, Current Treatment, and Prognosis:  Pt and pt daughter displayed knowledge surrounding pt diagnosis and treatment plan.   Emotional Assessment Appearance:  Appears stated age Attitude/Demeanor/Rapport:  Other (appropriate) Affect (typically observed):  Appropriate, Pleasant Orientation:  Oriented to Self, Oriented to Place, Oriented to  Time, Oriented to Situation Alcohol / Substance use:  Not Applicable Psych involvement (Current and /or in the community):  No (Comment)  Discharge Needs  Concerns to be addressed:  Discharge Planning Concerns Readmission within the last 30 days:  No Current discharge risk:  Lives alone Barriers to Discharge:  Continued Medical Work up   Alison Murray A, LCSW 04/07/2015, 5:41 PM [phone]

## 2015-04-07 NOTE — NC FL2 (Signed)
[place] LEVEL OF CARE SCREENING TOOL     IDENTIFICATION  Patient Name: [name] Birthdate: 07-26-1919 Sex: female Admission Date (Current Location): 04/04/2015  [facility] and [place] Number:  Herbalist and Address:  [facility],  [place] [address] [place]      Provider Number: [id number]  Attending Physician Name and Address:  Modena Jansky, MD  Relative Name and Phone Number:       Current Level of Care: Hospital Recommended Level of Care: [place] Prior Approval Number:    Date Approved/Denied:   PASRR Number: [id number]  Discharge Plan: SNF    Current Diagnoses: Patient Active Problem List   Diagnosis Date Noted  . Hyperkalemia 04/05/2015  . Hyponatremia 04/05/2015  . Expressive aphasia 04/05/2015  . Chronic osteomyelitis ([place]) 04/05/2015  . Cellulitis 04/04/2015  . Open chronic wound of right foot 12/03/2014  . HCAP (healthcare-associated pneumonia) 10/21/2014  . UTI (urinary tract infection) 10/21/2014  . Leukocytosis 10/21/2014  . Diabetes mellitus with renal manifestations, uncontrolled ([place]) 10/21/2014  . Acute respiratory failure with hypoxia ([place]) 10/21/2014  . Chronic ulcer of left foot ([place]) 10/21/2014  . Other chest pain 10/21/2014  . Chronic diastolic CHF (congestive heart failure) ([place]) 10/21/2014  . Diabetic neuropathy ([place]) 08/09/2014  . Coronary atherosclerosis of native coronary artery 06/07/2012  . Chronic kidney disease (CKD), stage IV (severe) ([place]) 04/12/2012  . Hypothyroidism 04/21/2011  . HYPERTENSION, BENIGN ESSENTIAL 12/08/2006    Orientation RESPIRATION BLADDER Height & Weight     Self, Time, Situation, Place  Normal Incontinent Weight: 163 lb 11.2 oz (74.254 kg) Height:  5\' 6"  (167.6 cm)  BEHAVIORAL SYMPTOMS/MOOD NEUROLOGICAL BOWEL NUTRITION STATUS   (no behaviors)  (NONE) Continent Diet (Diet Carb Modified)  AMBULATORY STATUS COMMUNICATION OF NEEDS Skin    Extensive Assist Verbally Surgical wounds (s/p Right foot transmetatarsal amputation; Needs Darco shoe for right foot)                       Personal Care Assistance Level of Assistance  Bathing, Dressing, Feeding Bathing Assistance: Limited assistance Feeding assistance: Independent Dressing Assistance: Limited assistance     Functional Limitations Info  Sight, Hearing, Speech Sight Info: Adequate Hearing Info: Adequate Speech Info: Adequate    SPECIAL CARE FACTORS FREQUENCY  PT (By licensed PT), OT (By licensed OT)     PT Frequency: 5 x a week OT Frequency: 5 x a week            Contractures Contractures Info: Not present    Additional Factors Info  Code Status, Allergies, Insulin Sliding Scale Code Status Info: FULL code status Allergies Info: Ephedrine, Morphine   Insulin Sliding Scale Info: Novolog 4 x a day       Current Medications (04/07/2015):  This is the current hospital active medication list Current Facility-Administered Medications  Medication Dose Route Frequency Provider Last Rate Last Dose  . acetaminophen (TYLENOL) tablet 650 mg  650 mg Oral Q6H PRN Norval Morton, MD   650 mg at 04/07/15 1346   Or  . acetaminophen (TYLENOL) suppository 650 mg  650 mg Rectal Q6H PRN Norval Morton, MD      . aspirin EC tablet 81 mg  81 mg Oral Daily Rondell Charmayne Sheer, MD   81 mg at 04/07/15 0800  . benzonatate (TESSALON) capsule 100 mg  100 mg Oral TID PRN Norval Morton, MD      .  carvedilol (COREG) tablet 6.25 mg  6.25 mg Oral BID WC Rondell Charmayne Sheer, MD   6.25 mg at 04/07/15 0801  . diltiazem (CARDIZEM CD) 24 hr capsule 180 mg  180 mg Oral QHS Rondell A Tamala Julian, MD   180 mg at 04/06/15 2220  . furosemide (LASIX) tablet 20 mg  20 mg Oral BID Modena Jansky, MD   20 mg at 04/07/15 0801  . gabapentin (NEURONTIN) capsule 300 mg  300 mg Oral BID Norval Morton, MD   300 mg at 04/07/15 0800  . heparin injection 5,000 Units  5,000 Units Subcutaneous 3 times per  day Norval Morton, MD   5,000 Units at 04/07/15 1307  . HYDROcodone-acetaminophen (NORCO/VICODIN) 5-325 MG per tablet 1-2 tablet  1-2 tablet Oral Q4H PRN Norval Morton, MD      . insulin aspart (novoLOG) injection 0-5 Units  0-5 Units Subcutaneous QHS Modena Jansky, MD   3 Units at 04/06/15 2221  . insulin aspart (novoLOG) injection 0-9 Units  0-9 Units Subcutaneous TID WC Modena Jansky, MD   5 Units at 04/07/15 1307  . insulin glargine (LANTUS) injection 35 Units  35 Units Subcutaneous q morning - 10a Modena Jansky, MD   35 Units at 04/07/15 1015  . isosorbide mononitrate (IMDUR) 24 hr tablet 30 mg  30 mg Oral QHS Norval Morton, MD   30 mg at 04/06/15 2220  . levothyroxine (SYNTHROID, LEVOTHROID) tablet 175 mcg  175 mcg Oral QAC breakfast Norval Morton, MD   175 mcg at 04/07/15 0801  . nutrition supplement (JUVEN) (JUVEN) powder packet 1 packet  1 packet Oral BID WC Norval Morton, MD   1 packet at 04/07/15 0800  . ondansetron (ZOFRAN) tablet 4 mg  4 mg Oral Q6H PRN Norval Morton, MD       Or  . ondansetron (ZOFRAN) injection 4 mg  4 mg Intravenous Q6H PRN Norval Morton, MD      . tamsulosin (FLOMAX) capsule 0.4 mg  0.4 mg Oral q morning - 10a Norval Morton, MD   0.4 mg at 04/07/15 1015     Discharge Medications: Please see discharge summary for a list of discharge medications.  Relevant Imaging Results:  Relevant Lab Results:   Additional Information SSN: [id number]  Earlena Werst, [place] Better A, LCSW

## 2015-04-07 NOTE — Evaluation (Signed)
Physical Therapy Evaluation Patient Details Name: [name] MRN: [id number] DOB: 1919-06-02 Today's Date: 04/07/2015   History of Present Illness  Ms. Gawlik is a 82 year old female with past medical history significant for diabetes mellitus type 2, s/p R ray  amputation, CAD, HTN, CVA with residual expressive aphasia, atrial flutter rate controlled; who presents with complaints of worsening wound on her right foot on 04/04/15.s/p transmetatarsal  AMPUTATION ON 04/06/15  Clinical Impression  The patient is very cooperative, attempted to stand and  Pivot but too much pain and pressure on R foot. . Will need to work on transfers to [facility], patient WC dependent PTA. Pt admitted with above diagnosis. Pt currently with functional limitations due to the deficits listed below (see PT Problem List).  Pt will benefit from skilled PT to increase their independence and safety with mobility to allow discharge to the venue listed below.       Follow Up Recommendations SNF;Supervision/Assistance - 24 hour    Equipment Recommendations  None recommended by PT    Recommendations for Other Services       Precautions / Restrictions Precautions Precautions: Fall Required Braces or Orthoses: Other Brace/Splint Other Brace/Splint: DARCO      Mobility  Bed Mobility Overal bed mobility: Needs Assistance Bed Mobility: Supine to Sit;Sit to Supine     Supine to sit: Min assist Sit to supine: Mod assist   General bed mobility comments: assist with legs onto bed  Transfers Overall transfer level: Needs assistance Equipment used: Rolling walker (2 wheeled) Transfers: Sit to/from Stand           General transfer comment: attempted  to stand x 2 at RW, stood briefly, increased pain of the R foot with DARCO. assisted to bed.  Ambulation/Gait                Stairs            Wheelchair Mobility    Modified Rankin (Stroke Patients Only)       Balance Overall balance assessment:  Needs assistance Sitting-balance support: Feet supported;No upper extremity supported Sitting balance-Leahy Scale: Fair                                       Pertinent Vitals/Pain Pain Assessment: Faces Faces Pain Scale: Hurts whole lot Pain Location: R foot Pain Descriptors / Indicators: Discomfort;Grimacing;Jabbing Pain Intervention(s): Limited activity within patient's tolerance;Patient requesting pain meds-RN notified;Repositioned    Home Living Family/patient expects to be discharged to:: Skilled nursing facility Living Arrangements: Alone                    Prior Function Level of Independence: Needs assistance   Gait / Transfers Assistance Needed: Transfers to/from wheelchair, toilet  ADL's / Homemaking Assistance Needed: aide 4-5 hours/day per previous admission.  Comments: Mod Ind with transfers to/from wheelchair     Hand Dominance        Extremity/Trunk Assessment   Upper Extremity Assessment: Generalized weakness           Lower Extremity Assessment: RLE deficits/detail;LLE deficits/detail RLE Deficits / Details: dressing on R foot, transmet. aMPUTATION LLE Deficits / Details: decreased ability  bear most weight on the  Left foot. does not have a shoe for L     Communication   Communication:  (dysarthria)  Cognition Arousal/Alertness: Awake/alert Behavior During Therapy: WFL for tasks assessed/performed Overall Cognitive  Status: Within Functional Limits for tasks assessed                      General Comments      Exercises        Assessment/Plan    PT Assessment Patient needs continued PT services  PT Diagnosis Difficulty walking   PT Problem List Decreased strength;Decreased range of motion;Decreased activity tolerance;Decreased balance;Decreased mobility;Decreased safety awareness;Decreased knowledge of precautions;Pain;Impaired sensation  PT Treatment Interventions DME instruction;Functional mobility  training;Therapeutic activities;Therapeutic exercise;Wheelchair mobility training   PT Goals (Current goals can be found in the Care Plan section) Acute Rehab PT Goals Patient Stated Goal: to go to [facility] PT Goal Formulation: With patient Time For Goal Achievement: 04/21/15 Potential to Achieve Goals: Good    Frequency Min 3X/week   Barriers to discharge        Co-evaluation               End of Session Equipment Utilized During Treatment: Gait belt Activity Tolerance: Patient limited by pain Patient left: in bed;with call bell/phone within reach;with bed alarm set;with nursing/sitter in room Nurse Communication: Mobility status;Patient requests pain meds         Time: 1325-1346 PT Time Calculation (min) (ACUTE ONLY): 21 min   Charges:   PT Evaluation $PT Eval Low Complexity: 1 Procedure     PT G Codes:        Claretha Cooper 04/07/2015, 2:00 PM Tresa Endo PT [phone]

## 2015-04-07 NOTE — Progress Notes (Signed)
Patient ID: [name], female   DOB: May 05, 1919, 82 y.o.   MRN: [id number] Comfortable this am.  Tolerated surgery very well.  Dressing clean and dry.  Can be discharge from ortho standpoint.  Will leave follow-up and dressing instructions

## 2015-04-07 NOTE — Care Management Important Message (Signed)
Important Message  Patient Details  Name: [name] MRN: [id number] Date of Birth: 03/14/1919   Medicare Important Message Given:  Yes    Camillo Flaming 04/07/2015, 10:17 AMImportant Message  Patient Details  Name: [name] MRN: [id number] Date of Birth: 1919/08/27   Medicare Important Message Given:  Yes    Camillo Flaming 04/07/2015, 10:16 AM

## 2015-04-07 NOTE — Progress Notes (Signed)
PROGRESS NOTE    KERON ERKKILA  [id number]  DOB: 03-Aug-1919  DOA: 04/04/2015 PCP: Jani Gravel, MD Outpatient Specialists:   Hospital course: 82 year old female patient with history of DM 2 with peripheral neuropathy, HTN, CAD, CVA with residual aphasia, atrial flutter status post ablation, PAD, hypothyroid, chronic diastolic CHF, s/p prior right first and second toe amputation, chronic right foot wounds, presented to ED with worsening of her right foot wound. Night prior to admission, she noticed right great toe with worsening swelling and developing black/brown area. Patient lives alone and utilizes a wheelchair to get around her home. She reports having a aide that comes in to see her for 4 hours a day and helps make sure that she has meals prepared. She was admitted for cellulitis complicating chronic right foot wounds and chronic osteomyelitis. Orthopedics consulted and s/p right transmetatarsal amputation on 3/12.Clinically improved. Possible DC to SNF on 3/14.   Assessment & Plan:   Cellulitis of right foot/chronic right foot wounds/chronic osteomyelitis, S/P transmetatarsal amputation of right foot [id number]  - complicating chronic right third toe ulcer with chronic osteomyelitis  - Empirically started on IV vancomycin and Zosyn  - Orthopedics was consulted and patient underwent transmetatarsal amputation of right foot on 3/12.  - Patient had been on IV broad-spectrum antibiotics since 3/10, continued same 48 hours postoperatively. Discussed with Dr. Ninfa Linden, orthopedics who indicated that intraoperatively he did not see any signs suggestive of infection and hence recommended discontinuing antibiotics. He also recommended WBAT on RLE.  Mild hyperkalemia - Resolved after brief IV fluid hydration  Type II DM with renal complications - Uncontrolled with CBGs ranging in the 204-311 range on 3/11. Increased Lantus and continue NovoLog SSI. - Likely precipitated by acute infection. -  Mildly uncontrolled and fluctuating. May need to adjust insulins further.  Atrial flutter - Seems to be in sinus rhythm clinically. Continue carvedilol and Cardizem. Probably not a candidate for anticoagulation due to advanced age and fall risk.  Stage IV chronic kidney disease - Baseline creatinine ranging between 1.6-2. Creatinine hence at baseline.  Hypothyroid - Continue Synthroid  CVA with residual expressive aphasia - Probably not a anticoagulation candidate secondary to advanced age and fall risk.  Hyponatremia - Resolved.  Essential hypertension - Controlled. Continue carvedilol, Cardizem and Imdur.  Chronic diastolic CHF - Compensated. Resumed Lasix  Mild acute blood loss anemia complicating anemia of chronic disease. - Baseline hemoglobin probably in the 11 g range. Drop to 9.2 today. Follow CBC and transfuse if hemoglobin <7 g per DL.    DVT prophylaxis: Subcutaneous heparin Code Status: Full Family Communication: None at bedside today.  Disposition Plan: Possible DC to SNF 3/14.   Consultants:  Orthopedics  Procedures:  Right foot transmetatarsal amputation 04/06/15.  Antimicrobials:  IV Zosyn 3/10 >3/13  IV Vancomycin 3/10 >3/13  Subjective: Denied complaints. As per RN, no acute issues. Denies pain in right operated foot.  Objective: Filed Vitals:   04/06/15 1331 04/06/15 2030 04/07/15 0507 04/07/15 1302  BP: 100/37 119/44 131/51 133/53  Pulse: 61 70 74 74  Temp: 97.7 F (36.5 C) 98.8 F (37.1 C) 99.1 F (37.3 C) 99.2 F (37.3 C)  TempSrc: Oral Oral Oral Oral  Resp: 16 16 16 18   Height:      Weight:      SpO2: 94% 93% 93% 95%   No intake or output data in the 24 hours ending 04/07/15 1404 Filed Weights   04/05/15 0500  Weight: 74.254 kg (163  lb 11.2 oz)    Exam:  General exam: Pleasant elderly female lying comfortably propped up in bed.  Respiratory system: Clear. No increased work of breathing. Cardiovascular system: S1 & S2  heard, RRR. No JVD, murmurs, gallops, clicks or pedal edema. Gastrointestinal system: Abdomen is nondistended, soft and nontender. Normal bowel sounds heard. Central nervous system: Alert and oriented to self and place. No focal neurological deficits. Extremities: Symmetric 5 x 5 power. Right foot postop dressing site clean and dry.   Data Reviewed: Basic Metabolic Panel:  Recent Labs Lab 04/04/15 1915 04/05/15 0419 04/07/15 0341  NA 134* 136 136  K 5.4* 4.6 4.6  CL 96* 101 102  CO2 28 25 27   GLUCOSE 219* 250* 243*  BUN 54* 53* 55*  CREATININE 1.76* 1.72* 1.82*  CALCIUM 9.1 8.6* 8.1*   Liver Function Tests: No results for input(s): AST, ALT, ALKPHOS, BILITOT, PROT, ALBUMIN in the last 168 hours. No results for input(s): LIPASE, AMYLASE in the last 168 hours. No results for input(s): AMMONIA in the last 168 hours. CBC:  Recent Labs Lab 04/04/15 1915 04/05/15 0419 04/07/15 0341  WBC 10.2 8.8 10.7*  NEUTROABS 6.8  --   --   HGB 11.8* 10.6* 9.2*  HCT 36.1 33.4* 28.4*  MCV 82.6 85.9 81.6  PLT 306 271 296   Cardiac Enzymes: No results for input(s): CKTOTAL, CKMB, CKMBINDEX, TROPONINI in the last 168 hours. BNP (last 3 results) No results for input(s): PROBNP in the last 8760 hours. CBG:  Recent Labs Lab 04/06/15 1241 04/06/15 1731 04/06/15 2146 04/07/15 0804 04/07/15 1148  GLUCAP 273* 227* 281* 135* 291*    Recent Results (from the past 240 hour(s))  Blood Cultures x 2 sites     Status: None (Preliminary result)   Collection Time: 04/04/15 10:10 PM  Result Value Ref Range Status   Specimen Description BLOOD BLOOD RIGHT FOREARM  Final   Special Requests BOTTLES DRAWN AEROBIC AND ANAEROBIC 5ML EA  Final   Culture   Final    NO GROWTH 3 DAYS Performed at [facility]    Report Status PENDING  Incomplete  Blood Cultures x 2 sites     Status: None (Preliminary result)   Collection Time: 04/04/15 10:18 PM  Result Value Ref Range Status   Specimen  Description BLOOD BLOOD LEFT FOREARM  Final   Special Requests BOTTLES DRAWN AEROBIC AND ANAEROBIC 5ML EA  Final   Culture   Final    NO GROWTH 3 DAYS Performed at [facility]    Report Status PENDING  Incomplete  Surgical pcr screen     Status: None   Collection Time: 04/05/15 12:15 PM  Result Value Ref Range Status   MRSA, PCR NEGATIVE NEGATIVE Final   Staphylococcus aureus NEGATIVE NEGATIVE Final    Comment:        The Xpert SA Assay (FDA approved for NASAL specimens in patients over 72 years of age), is one component of a comprehensive surveillance program.  Test performance has been validated by [facility] for patients greater than or equal to 73 year old. It is not intended to diagnose infection nor to guide or monitor treatment.          Studies: No results found.      Scheduled Meds: . aspirin EC  81 mg Oral Daily  . carvedilol  6.25 mg Oral BID WC  . diltiazem  180 mg Oral QHS  . furosemide  20 mg Oral BID  .  gabapentin  300 mg Oral BID  . heparin  5,000 Units Subcutaneous 3 times per day  . insulin aspart  0-5 Units Subcutaneous QHS  . insulin aspart  0-9 Units Subcutaneous TID WC  . insulin glargine  35 Units Subcutaneous q morning - 10a  . isosorbide mononitrate  30 mg Oral QHS  . levothyroxine  175 mcg Oral QAC breakfast  . nutrition supplement (JUVEN)  1 packet Oral BID WC  . tamsulosin  0.4 mg Oral q morning - 10a   Continuous Infusions:   Principal Problem:   Cellulitis Active Problems:   Hypothyroidism   Chronic kidney disease (CKD), stage IV (severe) (HCC)   Hyperkalemia   Hyponatremia   Expressive aphasia   Chronic osteomyelitis ([place])    Time spent: 15 minutes.    Vernell Leep, MD, FACP, FHM. [facility] Pager [phone]  If 7PM-7AM, please contact night-coverage [email or web address] Password [email or web address] 04/07/2015, 2:04 PM    LOS: 3 days

## 2015-04-07 NOTE — Clinical Social Work Placement (Signed)
   CLINICAL SOCIAL WORK PLACEMENT  NOTE  Date:  04/07/2015  Patient Details  Name: [name] MRN: [id number] Date of Birth: [birth year]  Clinical Social Work is seeking post-discharge placement for this patient at the [place] level of care (*CSW will initial, date and re-position this form in  chart as items are completed):  Yes   Patient/family provided with [place] Work Department's list of facilities offering this level of care within the geographic area requested by the patient (or if unable, by the patient's family).  Yes   Patient/family informed of their freedom to choose among providers that offer the needed level of care, that participate in Medicare, Medicaid or managed care program needed by the patient, have an available bed and are willing to accept the patient.  Yes   Patient/family informed of [place]'s ownership interest in [facility] and [facility], as well as of the fact that they are under no obligation to receive care at these facilities.  PASRR submitted to EDS on       PASRR number received on       Existing PASRR number confirmed on 04/07/15     FL2 transmitted to all facilities in geographic area requested by pt/family on 04/07/15     FL2 transmitted to all facilities within larger geographic area on       Patient informed that his/her managed care company has contracts with or will negotiate with certain facilities, including the following:            Patient/family informed of bed offers received.  Patient chooses bed at       Physician recommends and patient chooses bed at      Patient to be transferred to   on  .  Patient to be transferred to facility by       Patient family notified on   of transfer.  Name of family member notified:        PHYSICIAN Please sign FL2     Additional Comment:    _______________________________________________ Ladell Pier, LCSW 04/07/2015, 5:29 PM

## 2015-04-08 DIAGNOSIS — I10 Essential (primary) hypertension: Secondary | ICD-10-CM

## 2015-04-08 LAB — CBC
HEMATOCRIT: 28.4 % — AB (ref 36.0–46.0)
Hemoglobin: 9.1 g/dL — ABNORMAL LOW (ref 12.0–15.0)
MCH: 26 pg (ref 26.0–34.0)
MCHC: 32 g/dL (ref 30.0–36.0)
MCV: 81.1 fL (ref 78.0–100.0)
Platelets: 308 10*3/uL (ref 150–400)
RBC: 3.5 MIL/uL — AB (ref 3.87–5.11)
RDW: 13.9 % (ref 11.5–15.5)
WBC: 9.2 10*3/uL (ref 4.0–10.5)

## 2015-04-08 LAB — GLUCOSE, CAPILLARY
GLUCOSE-CAPILLARY: 183 mg/dL — AB (ref 65–99)
Glucose-Capillary: 76 mg/dL (ref 65–99)

## 2015-04-08 LAB — CBC AND DIFFERENTIAL
HCT: 28 % — AB (ref 36–46)
HEMOGLOBIN: 9.1 g/dL — AB (ref 12.0–16.0)
Platelets: 308 10*3/uL (ref 150–399)
WBC: 9.2 10^3/mL

## 2015-04-08 MED ORDER — INSULIN GLARGINE 100 UNIT/ML ~~LOC~~ SOLN: 35.0000 [IU] | Freq: Every morning | SUBCUTANEOUS | Status: DC

## 2015-04-08 MED ORDER — ASPIRIN EC 325 MG PO TBEC: 325.0000 mg | DELAYED_RELEASE_TABLET | Freq: Every day | ORAL | Status: DC

## 2015-04-08 MED ORDER — ACETAMINOPHEN 325 MG PO TABS: 650.0000 mg | ORAL_TABLET | Freq: Four times a day (QID) | ORAL | Status: DC | PRN

## 2015-04-08 NOTE — Clinical Social Work Placement (Signed)
   CLINICAL SOCIAL WORK PLACEMENT  NOTE  Date:  04/08/2015  Patient Details  Name: [name] MRN: [id number] Date of Birth: [birth year]  Clinical Social Work is seeking post-discharge placement for this patient at the [place] level of care (*CSW will initial, date and re-position this form in  chart as items are completed):  Yes   Patient/family provided with [place] Work Department's list of facilities offering this level of care within the geographic area requested by the patient (or if unable, by the patient's family).  Yes   Patient/family informed of their freedom to choose among providers that offer the needed level of care, that participate in Medicare, Medicaid or managed care program needed by the patient, have an available bed and are willing to accept the patient.  Yes   Patient/family informed of [place]'s ownership interest in [facility] and [facility], as well as of the fact that they are under no obligation to receive care at these facilities.  PASRR submitted to EDS on       PASRR number received on       Existing PASRR number confirmed on 04/07/15     FL2 transmitted to all facilities in geographic area requested by pt/family on 04/07/15     FL2 transmitted to all facilities within larger geographic area on       Patient informed that his/her managed care company has contracts with or will negotiate with certain facilities, including the following:        Yes   Patient/family informed of bed offers received.  Patient chooses bed at [facility]     Physician recommends and patient chooses bed at      Patient to be transferred to   on  .  Patient to be transferred to facility by       Patient family notified on   of transfer.  Name of family member notified:        PHYSICIAN Please sign FL2     Additional Comment:    _______________________________________________ Ladell Pier,  LCSW 04/08/2015, 11:02 AM

## 2015-04-08 NOTE — Care Management Note (Signed)
Case Management Note  Patient Details  Name: [name] MRN: [id number] Date of Birth: 09-12-1919  Subjective/Objective:   82 yo admitted with Cellulitis                 Action/Plan: From home alone with a private duty aide.  Expected Discharge Date:   (unknown)               Expected Discharge Plan:  Skilled Nursing Facility  In-House Referral:  Clinical Social Work  Discharge planning Services  CM Consult  Post Acute Care Choice:    Choice offered to:     DME Arranged:    DME Agency:     HH Arranged:    [place] Agency:     Status of Service:  Completed, signed off  Medicare Important Message Given:  Yes Date Medicare IM Given:    Medicare IM give by:    Date Additional Medicare IM Given:    Additional Medicare Important Message give by:     If discussed at [place] of Stay Meetings, dates discussed:    Additional Comments:  Lynnell Catalan, RN 04/08/2015, 1:49 PM

## 2015-04-08 NOTE — Progress Notes (Signed)
CSW continuing to follow.   CSW followed up with pt at bedside re: SNF bed offers.   [facility] was able to offer a bed and pt wants to go to [facility] as she has been to facility multiple times in the past.   CSW confirmed with [facility] that facility can accept pt.   CSW faxed pt clinical information to [employer] for insurance authorization. CSW spoke to [employer] to notify clinicals faxed. Awaiting pt insurance to review and provide authorization for SNF.   CSW to facilitate pt discharge needs when insurance authorization received.  [name], MSW, [place] Work [phone]

## 2015-04-08 NOTE — Clinical Social Work Placement (Signed)
   CLINICAL SOCIAL WORK PLACEMENT  NOTE  Date:  04/08/2015  Patient Details  Name: [name] MRN: [id number] Date of Birth: [birth year]  Clinical Social Work is seeking post-discharge placement for this patient at the [place] level of care (*CSW will initial, date and re-position this form in  chart as items are completed):  Yes   Patient/family provided with [place] Work Department's list of facilities offering this level of care within the geographic area requested by the patient (or if unable, by the patient's family).  Yes   Patient/family informed of their freedom to choose among providers that offer the needed level of care, that participate in Medicare, Medicaid or managed care program needed by the patient, have an available bed and are willing to accept the patient.  Yes   Patient/family informed of [place]'s ownership interest in [facility] and [facility], as well as of the fact that they are under no obligation to receive care at these facilities.  PASRR submitted to EDS on       PASRR number received on       Existing PASRR number confirmed on 04/07/15     FL2 transmitted to all facilities in geographic area requested by pt/family on 04/07/15     FL2 transmitted to all facilities within larger geographic area on       Patient informed that his/her managed care company has contracts with or will negotiate with certain facilities, including the following:        Yes   Patient/family informed of bed offers received.  Patient [facility] bed [facility]     Physician recommends and patient chooses bed at      Patient to be transferred to [facility] on 04/08/15.  Patient to be transferred to facility by pt daughter, [name] via private vehicle     Patient family notified on 04/08/15 of transfer.  Name of family member notified:  pt and pt daughter, [name]     PHYSICIAN Please sign FL2     Additional  Comment:    _______________________________________________ Ladell Pier, LCSW 04/08/2015, 1:33 PM

## 2015-04-08 NOTE — Progress Notes (Signed)
Pt for discharge to [facility].   CSW received insurance authorization from [employer].   CSW facilitated pt discharge needs including contacting facility, faxing pt discharge information via epic hub, discussing with pt and pt daughter, [name], providing RN phone number to call report, and providing dc packet to RN to provide to pt daughter to provide to [facility] upon arrival. Pt daughter, [name] plans to transport via private vehicle.   Pt and pt daughter appreciative of CSW assistance.  No further social work needs identified at this time.  CSW signing off.   Alison Murray, MSW, [place] Work [phone]

## 2015-04-08 NOTE — Discharge Summary (Signed)
Physician Discharge Summary  [name]  [id number]  DOB: 1919-12-29  DOA: 04/04/2015  PCP: Jani Gravel, MD  Outpatient specialists: Orthopedics: Dr. Meridee Score  Admit date: 04/04/2015 Discharge date: 04/08/2015  Time spent: Greater than 30 minutes  Recommendations for Outpatient Follow-up:  1. Dr. Jean Rosenthal, Orthopedics in 2 weeks for postop follow-up. SNF to arrange. 2. M.D. at SNF in 5 days with repeat labs (CBC & BMP). Please follow final blood culture results that were sent from the hospital. 3. Dr. Jani Gravel, PCP upon discharge from SNF. 4. Patient will be on EC Aspirin 325 MG daily 1 month for postop DVT prophylaxis. After completion of same, change EC Aspirin to 81 MG daily.  Discharge Diagnoses:  Principal Problem:   Cellulitis Active Problems:   Hypothyroidism   Chronic kidney disease (CKD), stage IV (severe) (HCC)   Hyperkalemia   Hyponatremia   Expressive aphasia   Chronic osteomyelitis ([place])   Discharge Condition: Improved & Stable  Diet recommendation: Diabetic and heart healthy diet.  Filed Weights   04/05/15 0500  Weight: 74.254 kg (163 lb 11.2 oz)    History of present illness:  82 year old female patient with history of DM 2 with peripheral neuropathy, HTN, CAD, CVA with residual aphasia, atrial flutter status post ablation, PAD, hypothyroid, chronic diastolic CHF, s/p prior right first and second toe amputation, chronic right foot wounds, presented to ED with worsening of her right foot wound. Night prior to admission, she noticed right great toe with worsening swelling and developing black/brown area. Patient lives alone and utilizes a wheelchair to get around her home. She reports having a aide that comes in to see her for 4 hours a day and helps make sure that she has meals prepared. She was admitted for cellulitis complicating chronic right foot wounds and chronic osteomyelitis. Orthopedics consulted and s/p right transmetatarsal  amputation on 3/12.  Hospital Course:  Cellulitis of right foot/chronic right foot wounds/chronic osteomyelitis, S/P transmetatarsal amputation of right foot [id number]  - complicating chronic right third toe ulcer with chronic osteomyelitis  - Empirically started on IV vancomycin and Zosyn  - Orthopedics was consulted and patient underwent transmetatarsal amputation of right foot on 3/12.  - Patient had been on IV broad-spectrum antibiotics since 3/10, continued same 48 hours postoperatively. Discussed with Dr. Ninfa Linden, orthopedics on 3/13 who indicated that intraoperatively he did not see any signs suggestive of infection and hence recommended discontinuing antibiotics. He also recommended WBAT on RLE. - As per Dr. Ninfa Linden, aspirin 325 MG daily 1 month for postop DVT prophylaxis and outpatient follow-up with him in 2 weeks.  Mild hyperkalemia - Resolved after brief IV fluid hydration  Type II DM with renal complications - Uncontrolled with CBGs fluctuating but mostly in the 200s. Had fasting blood sugar in the 70s despite morning. Continue slightly reduced than prior home dose of Lantus and continue NovoLog SSI. Monitor closely at SNF for further adjustment in treatment regimen.  Atrial flutter - Seems to be in sinus rhythm clinically. Continue carvedilol and Cardizem. Probably not a candidate for anticoagulation due to advanced age and fall risk.  Stage IV chronic kidney disease - Baseline creatinine ranging between 1.6-2. Creatinine hence at baseline.  Hypothyroid - Continue Synthroid  CVA with residual expressive aphasia - Probably not a anticoagulation candidate secondary to advanced age and fall risk. On aspirin 81 MG daily prior to admission which can be resumed after completing 325 MG daily for postop DVT prophylaxis.  Hyponatremia - Resolved.  Essential hypertension - Controlled. Continue carvedilol, Cardizem and Imdur. Discontinue NTG patch-duplication of regimen and blood  pressure controlled without it  Chronic diastolic CHF - Compensated. Resumed Lasix  Mild acute blood loss anemia complicating anemia of chronic disease. - Baseline hemoglobin probably in the 11 g range. Hemoglobin stable in the 9 g range over the last 2 days.    Consultants:  Orthopedics  Procedures:  Right foot transmetatarsal amputation 04/06/15.  Antimicrobials:  IV Zosyn 3/10 >3/13  IV Vancomycin 3/10 >3/13  Discharge Exam:  Complaints: Denies complaints. No pain reported. Has not received pain meds since admission except a dose of Tylenol yesterday. As per RN, no acute issues.   Filed Vitals:   04/07/15 0507 04/07/15 1302 04/07/15 2142 04/08/15 0620  BP: 131/51 133/53 125/44 116/47  Pulse: 74 74 66 65  Temp: 99.1 F (37.3 C) 99.2 F (37.3 C) 98.5 F (36.9 C) 98.4 F (36.9 C)  TempSrc: Oral Oral Oral Oral  Resp: 16 18 18 17   Height:      Weight:      SpO2: 93% 95% 96% 96%    General exam: Pleasant elderly female lying comfortably propped up in bed.  Respiratory system: Clear. No increased work of breathing. Cardiovascular system: S1 & S2 heard, RRR. No JVD, murmurs, gallops, clicks or pedal edema. Gastrointestinal system: Abdomen is nondistended, soft and nontender. Normal bowel sounds heard. Central nervous system: Alert and oriented. No focal neurological deficits. Extremities: Symmetric 5 x 5 power. Right foot postop dressing site clean and dry. Please see picture below for pre op details.          Discharge Instructions      Discharge Instructions    (HEART FAILURE PATIENTS) Call MD:  Anytime you have any of the following symptoms: 1) 3 pound weight gain in 24 hours or 5 pounds in 1 week 2) shortness of breath, with or without a dry hacking cough 3) swelling in the hands, feet or stomach 4) if you have to sleep on extra pillows at night in order to breathe.    Complete by:  As directed      Call MD for:  difficulty breathing, headache or  visual disturbances    Complete by:  As directed      Call MD for:  extreme fatigue    Complete by:  As directed      Call MD for:  persistant dizziness or light-headedness    Complete by:  As directed      Call MD for:  persistant nausea and vomiting    Complete by:  As directed      Call MD for:  redness, tenderness, or signs of infection (pain, swelling, redness, odor or green/yellow discharge around incision site)    Complete by:  As directed      Call MD for:  severe uncontrolled pain    Complete by:  As directed      Call MD for:  temperature >100.4    Complete by:  As directed      Diet - low sodium heart healthy    Complete by:  As directed      Diet Carb Modified    Complete by:  As directed      Discharge wound care:    Complete by:  As directed   Can put weight on right heel in post-op shoe. Keep current dressing on, clean, and dry until outpatient follow-up     Increase activity slowly  Complete by:  As directed             Medication List    STOP taking these medications        aspirin 81 MG tablet  Replaced by:  aspirin EC 325 MG tablet     benzonatate 100 MG capsule  Commonly known as:  TESSALON     doxycycline 100 MG capsule  Commonly known as:  VIBRAMYCIN     nitroGLYCERIN 0.2 mg/hr patch  Commonly known as:  NITRODUR - Dosed in mg/24 hr     nutrition supplement (JUVEN) Pack      TAKE these medications        acetaminophen 325 MG tablet  Commonly known as:  TYLENOL  Take 2 tablets (650 mg total) by mouth every 6 (six) hours as needed for mild pain, moderate pain, fever or headache (or Fever >/= 101).     aspirin EC 325 MG tablet  Take 1 tablet (325 mg total) by mouth daily. 325 MG daily 1 month for postoperative DVT prophylaxis, then switch back to prior home dose of 81 MG daily.     carvedilol 6.25 MG tablet  Commonly known as:  COREG  Take 6.25 mg by mouth 2 (two) times daily with a meal.     diltiazem 180 MG 24 hr capsule  Commonly  known as:  CARDIZEM CD  Take 180 mg by mouth at bedtime.     furosemide 20 MG tablet  Commonly known as:  LASIX  TAKE 1 TABLET BY MOUTH 2 TIMES DAILY.*PLEASE MAKE APPOINTMENT FOR REFILLS*     gabapentin 300 MG capsule  Commonly known as:  NEURONTIN  Take 300 mg by mouth 3 (three) times daily.     insulin aspart 100 UNIT/ML injection  Commonly known as:  novoLOG  Inject 2-12 Units into the skin 3 (three) times daily before meals. <70(0 units), 70-140 (0 units),141-180 (2 units), 181-220(4 units), 221-260 (6 units), 261-300(8 units), 301-340(10 units), >340 (12 units) notify MD     insulin glargine 100 UNIT/ML injection  Commonly known as:  LANTUS  Inject 0.35 mLs (35 Units total) into the skin every morning.     isosorbide mononitrate 30 MG 24 hr tablet  Commonly known as:  IMDUR  Take 30 mg by mouth at bedtime.     levothyroxine 175 MCG tablet  Commonly known as:  SYNTHROID, LEVOTHROID  Take 175 mcg by mouth daily before breakfast.     multivitamin tablet  Take 1 tablet by mouth daily.     tamsulosin 0.4 MG Caps capsule  Commonly known as:  FLOMAX  Take 0.4 mg by mouth every morning.     VITAMIN E PO  Take 1 capsule by mouth daily.       Follow-up Information    Follow up with Mcarthur Rossetti, MD. Schedule an appointment as soon as possible for a visit in 2 weeks.   Specialty:  Orthopedic Surgery   Contact information:   [place] [address] [phone]       Schedule an appointment as soon as possible for a visit with Jani Gravel, MD.   Specialty:  Internal Medicine   Why:  Upon discharge from SNF.   Contact information:   [place] [address] [phone]       Follow up with M.D. at SNF. Schedule an appointment as soon as possible for a visit in 5 days.   Why:  To  be seen with repeat labs (CBC & BMP).      Get Medicines reviewed and adjusted: Please take all your medications with you for your  next visit with your Primary MD  Please request your Primary MD to go over all hospital tests and procedure/radiological results at the follow up. Please ask your Primary MD to get all Hospital records sent to his/her office.  If you experience worsening of your admission symptoms, develop shortness of breath, life threatening emergency, suicidal or homicidal thoughts you must seek medical attention immediately by calling 911 or calling your MD immediately if symptoms less severe.  You must read complete instructions/literature along with all the possible adverse reactions/side effects for all the Medicines you take and that have been prescribed to you. Take any new Medicines after you have completely understood and accept all the possible adverse reactions/side effects.   Do not drive when taking pain medications.   Do not take more than prescribed Pain, Sleep and Anxiety Medications  Special Instructions: If you have smoked or chewed Tobacco in the last 2 yrs please stop smoking, stop any regular Alcohol and or any Recreational drug use.  Wear Seat belts while driving.  Please note  You were cared for by a hospitalist during your hospital stay. Once you are discharged, your primary care physician will handle any further medical issues. Please note that NO REFILLS for any discharge medications will be authorized once you are discharged, as it is imperative that you return to your primary care physician (or establish a relationship with a primary care physician if you do not have one) for your aftercare needs so that they can reassess your need for medications and monitor your lab values.    The results of significant diagnostics from this hospitalization (including imaging, microbiology, ancillary and laboratory) are listed below for reference.    Significant Diagnostic Studies: Dg Foot Complete Right  Apr 27, 2015  CLINICAL DATA:  Diabetic foot wound. EXAM: RIGHT FOOT COMPLETE - 3+ VIEW  COMPARISON:  08/08/2014 FINDINGS: Interval amputation of the great toe at the level of the distal metatarsal. There is erosive change of the medial cortex of the mid and proximal first metatarsal suggesting osteomyelitis. There is sclerotic periosteal reaction in this area consistent with chronic bony infection. Chronic fracture of the third metatarsal is unchanged. Prior amputation of the fourth toe is unchanged without evidence of osteomyelitis. IMPRESSION: Findings compatible with chronic osteomyelitis of the first metatarsal. Electronically Signed   By: Franchot Gallo M.D.   On: 04/27/15 18:24    Microbiology: Recent Results (from the past 240 hour(s))  Blood Cultures x 2 sites     Status: None (Preliminary result)   Collection Time: 04-27-2015 10:10 PM  Result Value Ref Range Status   Specimen Description BLOOD BLOOD RIGHT FOREARM  Final   Special Requests BOTTLES DRAWN AEROBIC AND ANAEROBIC 5ML EA  Final   Culture   Final    NO GROWTH 4 DAYS Performed at [facility]    Report Status PENDING  Incomplete  Blood Cultures x 2 sites     Status: None (Preliminary result)   Collection Time: 27-Apr-2015 10:18 PM  Result Value Ref Range Status   Specimen Description BLOOD BLOOD LEFT FOREARM  Final   Special Requests BOTTLES DRAWN AEROBIC AND ANAEROBIC 5ML EA  Final   Culture   Final    NO GROWTH 4 DAYS Performed at [facility]    Report Status PENDING  Incomplete  Surgical  pcr screen     Status: None   Collection Time: 04/05/15 12:15 PM  Result Value Ref Range Status   MRSA, PCR NEGATIVE NEGATIVE Final   Staphylococcus aureus NEGATIVE NEGATIVE Final    Comment:        The Xpert SA Assay (FDA approved for NASAL specimens in patients over 77 years of age), is one component of a comprehensive surveillance program.  Test performance has been validated by [facility] for patients greater than or equal to 33 year old. It is not intended to diagnose infection nor to guide  or monitor treatment.      Labs: Basic Metabolic Panel:  Recent Labs Lab 04/04/15 1915 04/05/15 0419 04/07/15 0341  NA 134* 136 136  K 5.4* 4.6 4.6  CL 96* 101 102  CO2 28 25 27   GLUCOSE 219* 250* 243*  BUN 54* 53* 55*  CREATININE 1.76* 1.72* 1.82*  CALCIUM 9.1 8.6* 8.1*   Liver Function Tests: No results for input(s): AST, ALT, ALKPHOS, BILITOT, PROT, ALBUMIN in the last 168 hours. No results for input(s): LIPASE, AMYLASE in the last 168 hours. No results for input(s): AMMONIA in the last 168 hours. CBC:  Recent Labs Lab 04/04/15 1915 04/05/15 0419 04/07/15 0341 04/08/15 0345  WBC 10.2 8.8 10.7* 9.2  NEUTROABS 6.8  --   --   --   HGB 11.8* 10.6* 9.2* 9.1*  HCT 36.1 33.4* 28.4* 28.4*  MCV 82.6 85.9 81.6 81.1  PLT 306 271 296 308   Cardiac Enzymes: No results for input(s): CKTOTAL, CKMB, CKMBINDEX, TROPONINI in the last 168 hours. BNP: BNP (last 3 results) No results for input(s): BNP in the last 8760 hours.  ProBNP (last 3 results) No results for input(s): PROBNP in the last 8760 hours.  CBG:  Recent Labs Lab 04/07/15 1148 04/07/15 1735 04/07/15 2144 04/08/15 0748 04/08/15 1134  GLUCAP 291* 286* 293* 76 183*    Left phone message for patient's daughter Ms. [name]   SignedVernell Leep, MD, FACP, [facility]. [facility] Pager [phone]  If 7PM-7AM, please contact night-coverage [email or web address] Password [email or web address] 04/08/2015, 1:15 PM

## 2015-04-09 ENCOUNTER — Non-Acute Institutional Stay (SKILLED_NURSING_FACILITY): Payer: Medicare Other | Admitting: Adult Health

## 2015-04-09 ENCOUNTER — Encounter: Payer: Self-pay | Admitting: Adult Health

## 2015-04-09 DIAGNOSIS — R339 Retention of urine, unspecified: Secondary | ICD-10-CM

## 2015-04-09 DIAGNOSIS — R5381 Other malaise: Secondary | ICD-10-CM

## 2015-04-09 DIAGNOSIS — N184 Chronic kidney disease, stage 4 (severe): Secondary | ICD-10-CM | POA: Diagnosis not present

## 2015-04-09 DIAGNOSIS — I5032 Chronic diastolic (congestive) heart failure: Secondary | ICD-10-CM

## 2015-04-09 DIAGNOSIS — Z794 Long term (current) use of insulin: Secondary | ICD-10-CM

## 2015-04-09 DIAGNOSIS — M8660 Other chronic osteomyelitis, unspecified site: Secondary | ICD-10-CM | POA: Diagnosis not present

## 2015-04-09 DIAGNOSIS — E039 Hypothyroidism, unspecified: Secondary | ICD-10-CM

## 2015-04-09 DIAGNOSIS — G629 Polyneuropathy, unspecified: Secondary | ICD-10-CM | POA: Diagnosis not present

## 2015-04-09 DIAGNOSIS — I4892 Unspecified atrial flutter: Secondary | ICD-10-CM

## 2015-04-09 DIAGNOSIS — L03115 Cellulitis of right lower limb: Secondary | ICD-10-CM

## 2015-04-09 DIAGNOSIS — E1142 Type 2 diabetes mellitus with diabetic polyneuropathy: Secondary | ICD-10-CM

## 2015-04-09 DIAGNOSIS — D62 Acute posthemorrhagic anemia: Secondary | ICD-10-CM

## 2015-04-09 LAB — CULTURE, BLOOD (ROUTINE X 2)
CULTURE: NO GROWTH
Culture: NO GROWTH

## 2015-04-09 NOTE — Progress Notes (Signed)
Patient ID: [name], female   DOB: 06-04-1919, 82 y.o.   MRN: [id number]    DATE:  04/09/2015   MRN:  [id number]  BIRTHDAY: [birth year]  Facility:  Nursing Home Location:  [place] Room Number: [id number]  LEVEL OF CARE:  SNF (31)  Contact Information    Name Relation Home Work Rosalia Daughter [phone]  [phone]       Code Status History    Date Active Date Inactive Code Status Order ID Comments User Context   04/04/2015  9:56 PM 04/08/2015  6:23 PM Full Code [id number]  Norval Morton, MD ED   10/22/2014 11:27 AM 10/25/2014  8:06 PM DNR [id number]  Thurnell Lose, MD Inpatient   10/21/2014  5:31 PM 10/22/2014 11:27 AM Full Code [id number]  Robbie Lis, MD Inpatient   08/08/2014  5:20 PM 08/13/2014  6:08 PM Full Code [id number]  Theodis Blaze, MD Inpatient   11/18/2013 12:57 PM 11/21/2013  4:33 PM Full Code [id number]  Barton Dubois, MD Inpatient   08/28/2013 12:55 AM 08/30/2013  7:16 PM Full Code [id number]  Hosie Poisson, MD Inpatient   04/14/2012  7:40 PM 04/17/2012  5:41 PM Full Code [id number]  Newt Minion, MD Inpatient   04/12/2012 12:18 AM 04/14/2012  7:40 PM Full Code [id number]  Janell Quiet, MD Inpatient   04/21/2011  9:40 PM 04/27/2011  7:19 PM Full Code [id number]  Janae Bridgeman, RN Inpatient       Chief Complaint  Patient presents with  . Hospitalization Follow-up    HISTORY OF PRESENT ILLNESS:   This is a 82 year old female who has been admitted to [facility] on  04/08/15 from [facility]. She has PMH of DM2 with peripheral neuropathy, hypertension, CAD, CVA with residual aphasia, atrial flutter S/P ablation, PAD, hypothyroid, chronic diastolic CHF, S/P prior right 1st and 2nd toe amputation and chronic right foot wounds. She was hospitalized for cellulitis complicating chronic right foot wound and chronic osteomyelitis. She was empirically started on IV vancomycin and Zosyn. Orthopedics consulted and had right  transmetatarsal amputation on 3/12.  She has been admitted for a short-term rehabilitation.  PAST MEDICAL HISTORY:  Past Medical History  Diagnosis Date  . Myocardial infarction ([place])   . Coronary artery disease   . Hypertension   . Dysrhythmia   . Peripheral vascular disease ([place])   . Hypothyroidism   . Diabetes mellitus   . Presbyesophagus 04/23/2011  . Urinary retention with incomplete bladder emptying 04/23/2011  . UTI (lower urinary tract infection) 04/21/2011  . Diastolic CHF, chronic (HCC)     Grade I diastolic dysfunction on Echo 04/26/11  . Anaphylaxis 04/25/2011    Thought to be from Rocephin  . History of atrial flutter     ablation  . C. difficile colitis 04/17/2012  . Atrial flutter ([place]) 11/01/2012  . HCAP (healthcare-associated pneumonia) 10/21/2014  . Stroke [facility]) 2003    "lost the use of all of my right side"  . HOH (hard of hearing)   . Cellulitis   . CKD (chronic kidney disease), stage IV ([place])   . Expressive aphasia   . Hyperkalemia   . Hyponatremia      CURRENT MEDICATIONS: Reviewed  Patient's Medications  New Prescriptions   No medications on file  Previous Medications   ACETAMINOPHEN (TYLENOL) 325 MG TABLET    Take 325-650 mg by mouth every 6 (six) hours as needed for  mild pain, moderate pain, fever or headache.   ASPIRIN EC 325 MG TABLET    Take 1 tablet (325 mg total) by mouth daily. 325 MG daily 1 month for postoperative DVT prophylaxis, then switch back to prior home dose of 81 MG daily.   ASPIRIN EC 81 MG TABLET    Take 81 mg by mouth daily.   CARVEDILOL (COREG) 6.25 MG TABLET    Take 6.25 mg by mouth 2 (two) times daily with a meal.     DILTIAZEM (CARDIZEM CD) 180 MG 24 HR CAPSULE    Take 180 mg by mouth at bedtime.    FUROSEMIDE (LASIX) 20 MG TABLET    TAKE 1 TABLET BY MOUTH 2 TIMES DAILY.*PLEASE MAKE APPOINTMENT FOR REFILLS*   GABAPENTIN (NEURONTIN) 300 MG CAPSULE    Take 300 mg by mouth 3 (three) times daily.    INSULIN ASPART (NOVOLOG) 100  UNIT/ML INJECTION    Inject 2-12 Units into the skin 3 (three) times daily before meals. Give no units for BS 0-69 - hypoglycemic protocol & notify MD;  70-140 (0 units),141-180 (2 units), 181-220(4 units), 221-260 (6 units), 261-300(8 units), 301-340(10 units), >340 (12 units) notify MD   INSULIN GLARGINE (LANTUS) 100 UNIT/ML INJECTION    Inject 0.35 mLs (35 Units total) into the skin every morning.   ISOSORBIDE MONONITRATE (IMDUR) 30 MG 24 HR TABLET    Take 30 mg by mouth at bedtime.    LEVOTHYROXINE (SYNTHROID, LEVOTHROID) 175 MCG TABLET    Take 175 mcg by mouth daily before breakfast.   MULTIPLE VITAMIN (MULTIVITAMIN) TABLET    Take 1 tablet by mouth daily.   TAMSULOSIN HCL (FLOMAX) 0.4 MG CAPS    Take 0.4 mg by mouth every morning.    VITAMIN E PO    Take 1 capsule by mouth daily. 400 mg capsule for supplement  Modified Medications   No medications on file  Discontinued Medications   ACETAMINOPHEN (TYLENOL) 325 MG TABLET    Take 2 tablets (650 mg total) by mouth every 6 (six) hours as needed for mild pain, moderate pain, fever or headache (or Fever >/= 101).     Allergies  Allergen Reactions  . Corn-Containing Products   . Ephedrine     Unknown.    . Morphine Other (See Comments)    Difficulty waking up & talking after surgery  . Rocephin [Ceftriaxone]      REVIEW OF SYSTEMS:  GENERAL: no change in appetite, no fatigue, no weight changes, no fever, chills or weakness EYES: Denies change in vision, dry eyes, eye pain, itching or discharge EARS: Denies change in hearing, ringing in ears, or earache NOSE: Denies nasal congestion or epistaxis MOUTH and THROAT: Denies oral discomfort, gingival pain or bleeding, pain from teeth or hoarseness   RESPIRATORY: no cough, SOB, DOE, wheezing, hemoptysis CARDIAC: no chest pain, edema or palpitations GI: no abdominal pain, diarrhea, constipation, heart burn, nausea or vomiting GU: Denies dysuria, frequency, hematuria, incontinence, or  discharge PSYCHIATRIC: Denies feeling of depression or anxiety. No report of hallucinations, insomnia, paranoia, or agitation    PHYSICAL EXAMINATION  GENERAL APPEARANCE: Well nourished. In no acute distress. Normal body habitus SKIN:  Skin is warm and dry. Right foot is covered with dressing. HEAD: Normal in size and contour. No evidence of trauma EYES: Lids open and close normally. No blepharitis, entropion or ectropion. PERRL. Conjunctivae are clear and sclerae are white. Lenses are without opacity EARS: Pinnae are normal. Patient hears normal voice  tunes of the examiner MOUTH and THROAT: Lips are without lesions. Oral mucosa is moist and without lesions. Tongue is normal in shape, size, and color and without lesions NECK: supple, trachea midline, no neck masses, no thyroid tenderness, no thyromegaly LYMPHATICS: no LAN in the neck, no supraclavicular LAN RESPIRATORY: breathing is even & unlabored, BS CTAB CARDIAC: RRR, no murmur,no extra heart sounds, no edema GI: abdomen soft, normal BS, no masses, no tenderness, no hepatomegaly, no splenomegaly EXTREMITIES:  Able to move X 4 extremities PSYCHIATRIC: Alert and oriented X 3. Affect and behavior are appropriate  LABS/RADIOLOGY: Labs reviewed: Basic Metabolic Panel:  Recent Labs  10/21/14 1826  04/04/15 1915 04/05/15 0419 04/07/15 0341  NA 135  < > 134* 136 136  K 3.8  < > 5.4* 4.6 4.6  CL 102  < > 96* 101 102  CO2 23  < > 28 25 27   GLUCOSE 328*  < > 219* 250* 243*  BUN 51*  < > 54* 53* 55*  CREATININE 1.85*  < > 1.76* 1.72* 1.82*  CALCIUM 8.3*  < > 9.1 8.6* 8.1*  MG 1.6*  --   --   --   --   PHOS 3.0  --   --   --   --   < > = values in this interval not displayed. Liver Function Tests:  Recent Labs  08/13/14 0412 10/21/14 1826 12/03/14 1303  AST 12* 15 19  ALT 11* 12* 12*  ALKPHOS 56 94 67  BILITOT 0.4 0.6 0.5  PROT 6.8 7.0 7.2  ALBUMIN 2.4* 2.4* 3.2*    CBC:  Recent Labs  08/08/14 1411  04/04/15 1915  04/05/15 0419 04/07/15 0341 04/08/15 0345  WBC 11.5*  < > 10.2 8.8 10.7* 9.2  NEUTROABS 8.3*  --  6.8  --   --   --   HGB 10.3*  < > 11.8* 10.6* 9.2* 9.1*  HCT 31.8*  < > 36.1 33.4* 28.4* 28.4*  MCV 81.3  < > 82.6 85.9 81.6 81.1  PLT 386  < > 306 271 296 308  < > = values in this interval not displayed.  Cardiac Enzymes:  Recent Labs  10/22/14 0513 10/22/14 1317 10/22/14 1818  TROPONINI 0.09* 0.04* 0.03   BNP: Invalid input(s): POCBNP CBG:  Recent Labs  04/07/15 2144 04/08/15 0748 04/08/15 1134  GLUCAP 293* 76 183*      Dg Foot Complete Right  04/04/2015  CLINICAL DATA:  Diabetic foot wound. EXAM: RIGHT FOOT COMPLETE - 3+ VIEW COMPARISON:  08/08/2014 FINDINGS: Interval amputation of the great toe at the level of the distal metatarsal. There is erosive change of the medial cortex of the mid and proximal first metatarsal suggesting osteomyelitis. There is sclerotic periosteal reaction in this area consistent with chronic bony infection. Chronic fracture of the third metatarsal is unchanged. Prior amputation of the fourth toe is unchanged without evidence of osteomyelitis. IMPRESSION: Findings compatible with chronic osteomyelitis of the first metatarsal. Electronically Signed   By: Franchot Gallo M.D.   On: 04/04/2015 18:24    ASSESSMENT/PLAN:  Physical deconditioning - for rehabilitation  Cellulitis of right foot/Osteomyelitis S/P metatarsal amputation of right foot - for rehabilitation;; RLE WBAT; ASA 325 mg daily X 1 month for DVT prophylaxis; continue acetaminophen 325 mg 2 tabs = extended 50 mg by mouth every 6 hours when necessary for pain; follow-up with Dr. Ninfa Linden, orthopedic surgeon, in 2 weeks  Atrial flutter - rate controlled; continue carvedilol 6.25 mg  1 tab by mouth twice a day and diltiazem 180 mg 24 hour 1 capsule by mouth daily at bedtime  Chronic diastolic CHF - stable; continue Lasix 20 mg 1 tab by mouth twice a day  Neuropathy - stable; continue  Neurontin 300 mg 1 capsule by mouth 3 times a day   Diabetes mellitus type 2 with renal complications - continue NovoLog sliding scale 3 times a day and Lantus 35 units subcutaneous every morning; check hgbA1c  Hypothyroidism - continue Synthroid 175 g 1 tab PO daily  Urinary retention - continue Flomax 0.4 mg 1 capsule by mouth every morning chronic kidney disease, stage IV  Chronic kidney disease, stage IV - creatinine 1.82; check CMP  Anemia, acute blood loss - hemoglobin 9.1; check CBC     Goals of care:  Short-term rehabilitation    [facility], NP [place]

## 2015-04-10 ENCOUNTER — Observation Stay (HOSPITAL_COMMUNITY): Payer: Medicare Other

## 2015-04-10 ENCOUNTER — Non-Acute Institutional Stay (SKILLED_NURSING_FACILITY): Payer: Medicare Other | Admitting: Internal Medicine

## 2015-04-10 ENCOUNTER — Encounter (HOSPITAL_COMMUNITY): Payer: Self-pay | Admitting: Emergency Medicine

## 2015-04-10 ENCOUNTER — Observation Stay (HOSPITAL_COMMUNITY)
Admission: EM | Admit: 2015-04-10 | Discharge: 2015-04-12 | Disposition: A | Discharge status: Skilled Nursing Facility | Payer: Medicare Other | Attending: Family Medicine | Admitting: Family Medicine

## 2015-04-10 ENCOUNTER — Emergency Department (HOSPITAL_COMMUNITY): Payer: Medicare Other

## 2015-04-10 DIAGNOSIS — E1165 Type 2 diabetes mellitus with hyperglycemia: Secondary | ICD-10-CM

## 2015-04-10 DIAGNOSIS — Z993 Dependence on wheelchair: Secondary | ICD-10-CM | POA: Insufficient documentation

## 2015-04-10 DIAGNOSIS — R531 Weakness: Secondary | ICD-10-CM | POA: Diagnosis not present

## 2015-04-10 DIAGNOSIS — Z8673 Personal history of transient ischemic attack (TIA), and cerebral infarction without residual deficits: Secondary | ICD-10-CM | POA: Insufficient documentation

## 2015-04-10 DIAGNOSIS — I2510 Atherosclerotic heart disease of native coronary artery without angina pectoris: Secondary | ICD-10-CM | POA: Insufficient documentation

## 2015-04-10 DIAGNOSIS — F0390 Unspecified dementia without behavioral disturbance: Secondary | ICD-10-CM | POA: Diagnosis not present

## 2015-04-10 DIAGNOSIS — G459 Transient cerebral ischemic attack, unspecified: Secondary | ICD-10-CM

## 2015-04-10 DIAGNOSIS — E1122 Type 2 diabetes mellitus with diabetic chronic kidney disease: Secondary | ICD-10-CM

## 2015-04-10 DIAGNOSIS — R2990 Unspecified symptoms and signs involving the nervous system: Secondary | ICD-10-CM

## 2015-04-10 DIAGNOSIS — E1140 Type 2 diabetes mellitus with diabetic neuropathy, unspecified: Secondary | ICD-10-CM | POA: Diagnosis not present

## 2015-04-10 DIAGNOSIS — I5032 Chronic diastolic (congestive) heart failure: Secondary | ICD-10-CM | POA: Diagnosis not present

## 2015-04-10 DIAGNOSIS — E039 Hypothyroidism, unspecified: Secondary | ICD-10-CM | POA: Insufficient documentation

## 2015-04-10 DIAGNOSIS — I130 Hypertensive heart and chronic kidney disease with heart failure and stage 1 through stage 4 chronic kidney disease, or unspecified chronic kidney disease: Secondary | ICD-10-CM | POA: Diagnosis not present

## 2015-04-10 DIAGNOSIS — I10 Essential (primary) hypertension: Secondary | ICD-10-CM

## 2015-04-10 DIAGNOSIS — R4781 Slurred speech: Secondary | ICD-10-CM | POA: Diagnosis not present

## 2015-04-10 DIAGNOSIS — E1151 Type 2 diabetes mellitus with diabetic peripheral angiopathy without gangrene: Secondary | ICD-10-CM | POA: Insufficient documentation

## 2015-04-10 DIAGNOSIS — R262 Difficulty in walking, not elsewhere classified: Secondary | ICD-10-CM | POA: Insufficient documentation

## 2015-04-10 DIAGNOSIS — Z794 Long term (current) use of insulin: Secondary | ICD-10-CM | POA: Diagnosis not present

## 2015-04-10 DIAGNOSIS — E1159 Type 2 diabetes mellitus with other circulatory complications: Secondary | ICD-10-CM | POA: Insufficient documentation

## 2015-04-10 DIAGNOSIS — E1129 Type 2 diabetes mellitus with other diabetic kidney complication: Secondary | ICD-10-CM | POA: Diagnosis present

## 2015-04-10 DIAGNOSIS — I252 Old myocardial infarction: Secondary | ICD-10-CM | POA: Insufficient documentation

## 2015-04-10 DIAGNOSIS — R55 Syncope and collapse: Principal | ICD-10-CM | POA: Insufficient documentation

## 2015-04-10 DIAGNOSIS — I63031 Cerebral infarction due to thrombosis of right carotid artery: Secondary | ICD-10-CM

## 2015-04-10 DIAGNOSIS — Z87891 Personal history of nicotine dependence: Secondary | ICD-10-CM | POA: Insufficient documentation

## 2015-04-10 DIAGNOSIS — N184 Chronic kidney disease, stage 4 (severe): Secondary | ICD-10-CM | POA: Diagnosis not present

## 2015-04-10 DIAGNOSIS — Z7982 Long term (current) use of aspirin: Secondary | ICD-10-CM | POA: Diagnosis not present

## 2015-04-10 DIAGNOSIS — IMO0002 Reserved for concepts with insufficient information to code with codable children: Secondary | ICD-10-CM | POA: Diagnosis present

## 2015-04-10 DIAGNOSIS — Z89439 Acquired absence of unspecified foot: Secondary | ICD-10-CM

## 2015-04-10 DIAGNOSIS — E860 Dehydration: Secondary | ICD-10-CM | POA: Insufficient documentation

## 2015-04-10 DIAGNOSIS — I639 Cerebral infarction, unspecified: Secondary | ICD-10-CM | POA: Diagnosis present

## 2015-04-10 LAB — I-STAT CHEM 8, ED
BUN: 40 mg/dL — AB (ref 6–20)
CALCIUM ION: 1.11 mmol/L — AB (ref 1.13–1.30)
Chloride: 96 mmol/L — ABNORMAL LOW (ref 101–111)
Creatinine, Ser: 1.7 mg/dL — ABNORMAL HIGH (ref 0.44–1.00)
Glucose, Bld: 298 mg/dL — ABNORMAL HIGH (ref 65–99)
HEMATOCRIT: 34 % — AB (ref 36.0–46.0)
HEMOGLOBIN: 11.6 g/dL — AB (ref 12.0–15.0)
Potassium: 4.2 mmol/L (ref 3.5–5.1)
SODIUM: 138 mmol/L (ref 135–145)
TCO2: 28 mmol/L (ref 0–100)

## 2015-04-10 LAB — CBC
HCT: 32.8 % — ABNORMAL LOW (ref 36.0–46.0)
Hemoglobin: 10.1 g/dL — ABNORMAL LOW (ref 12.0–15.0)
MCH: 26.4 pg (ref 26.0–34.0)
MCHC: 30.8 g/dL (ref 30.0–36.0)
MCV: 85.6 fL (ref 78.0–100.0)
PLATELETS: 348 10*3/uL (ref 150–400)
RBC: 3.83 MIL/uL — ABNORMAL LOW (ref 3.87–5.11)
RDW: 14.3 % (ref 11.5–15.5)
WBC: 8.8 10*3/uL (ref 4.0–10.5)

## 2015-04-10 LAB — COMPREHENSIVE METABOLIC PANEL
ALBUMIN: 2.6 g/dL — AB (ref 3.5–5.0)
ALT: 10 U/L — ABNORMAL LOW (ref 14–54)
ANION GAP: 12 (ref 5–15)
AST: 15 U/L (ref 15–41)
Alkaline Phosphatase: 55 U/L (ref 38–126)
BILIRUBIN TOTAL: 0.2 mg/dL — AB (ref 0.3–1.2)
BUN: 40 mg/dL — ABNORMAL HIGH (ref 6–20)
CO2: 28 mmol/L (ref 22–32)
Calcium: 9.1 mg/dL (ref 8.9–10.3)
Chloride: 99 mmol/L — ABNORMAL LOW (ref 101–111)
Creatinine, Ser: 1.77 mg/dL — ABNORMAL HIGH (ref 0.44–1.00)
GFR calc non Af Amer: 24 mL/min — ABNORMAL LOW (ref >60)
GFR, EST AFRICAN AMERICAN: 27 mL/min — AB (ref >60)
GLUCOSE: 315 mg/dL — AB (ref 65–99)
POTASSIUM: 4.2 mmol/L (ref 3.5–5.1)
SODIUM: 139 mmol/L (ref 135–145)
TOTAL PROTEIN: 6.5 g/dL (ref 6.5–8.1)

## 2015-04-10 LAB — PROTIME-INR
INR: 1.17 (ref 0.00–1.49)
PROTHROMBIN TIME: 15.1 s (ref 11.6–15.2)

## 2015-04-10 LAB — URINALYSIS, ROUTINE W REFLEX MICROSCOPIC
Bilirubin Urine: NEGATIVE
Glucose, UA: 500 mg/dL — AB
Ketones, ur: NEGATIVE mg/dL
NITRITE: NEGATIVE
PH: 6 (ref 5.0–8.0)
PROTEIN: NEGATIVE mg/dL
Specific Gravity, Urine: 1.013 (ref 1.005–1.030)

## 2015-04-10 LAB — DIFFERENTIAL
Basophils Absolute: 0.1 10*3/uL (ref 0.0–0.1)
Basophils Relative: 2 %
EOS ABS: 0.5 10*3/uL (ref 0.0–0.7)
EOS PCT: 6 %
LYMPHS ABS: 2.1 10*3/uL (ref 0.7–4.0)
Lymphocytes Relative: 24 %
MONO ABS: 0.7 10*3/uL (ref 0.1–1.0)
Monocytes Relative: 8 %
NEUTROS PCT: 60 %
Neutro Abs: 5.3 10*3/uL (ref 1.7–7.7)

## 2015-04-10 LAB — APTT: aPTT: 46 seconds — ABNORMAL HIGH (ref 24–37)

## 2015-04-10 LAB — TSH: TSH: 0.551 u[IU]/mL (ref 0.350–4.500)

## 2015-04-10 LAB — BASIC METABOLIC PANEL
BUN: 43 mg/dL — AB (ref 4–21)
Creatinine: 1.8 mg/dL — AB (ref 0.5–1.1)
Glucose: 256 mg/dL
POTASSIUM: 4.1 mmol/L (ref 3.4–5.3)
SODIUM: 141 mmol/L (ref 137–147)

## 2015-04-10 LAB — I-STAT TROPONIN, ED: Troponin i, poc: 0.01 ng/mL (ref 0.00–0.08)

## 2015-04-10 LAB — URINE MICROSCOPIC-ADD ON

## 2015-04-10 LAB — CBC AND DIFFERENTIAL
HEMATOCRIT: 32 % — AB (ref 36–46)
HEMOGLOBIN: 9.7 g/dL — AB (ref 12.0–16.0)
NEUTROS ABS: 5 /uL
Platelets: 393 10*3/uL (ref 150–399)
WBC: 8.7 10^3/mL

## 2015-04-10 LAB — GLUCOSE, CAPILLARY: GLUCOSE-CAPILLARY: 123 mg/dL — AB (ref 65–99)

## 2015-04-10 LAB — CBG MONITORING, ED
Glucose-Capillary: 329 mg/dL — ABNORMAL HIGH (ref 65–99)
Glucose-Capillary: 210 mg/dL — ABNORMAL HIGH (ref 65–99)

## 2015-04-10 LAB — ETHANOL

## 2015-04-10 LAB — HEMOGLOBIN A1C: Hemoglobin A1C: 11.7

## 2015-04-10 MED ORDER — LEVOTHYROXINE SODIUM 75 MCG PO TABS
175.0000 ug | ORAL_TABLET | Freq: Every day | ORAL | Status: DC
  Administered 2015-04-11 – 2015-04-12 (×2): 175 ug via ORAL
  Filled 2015-04-10 (×2): qty 1

## 2015-04-10 MED ORDER — CARVEDILOL 6.25 MG PO TABS
6.2500 mg | ORAL_TABLET | Freq: Two times a day (BID) | ORAL | Status: DC
  Administered 2015-04-10 – 2015-04-12 (×5): 6.25 mg via ORAL
  Filled 2015-04-10 (×5): qty 1

## 2015-04-10 MED ORDER — ASPIRIN EC 325 MG PO TBEC
325.0000 mg | DELAYED_RELEASE_TABLET | Freq: Every day | ORAL | Status: DC
  Administered 2015-04-11 – 2015-04-12 (×2): 325 mg via ORAL
  Filled 2015-04-10 (×2): qty 1

## 2015-04-10 MED ORDER — SODIUM CHLORIDE 0.9 % IV BOLUS (SEPSIS): 500.0000 mL | Freq: Once | INTRAVENOUS | Status: DC

## 2015-04-10 MED ORDER — ADULT MULTIVITAMIN W/MINERALS CH
1.0000 | ORAL_TABLET | Freq: Every day | ORAL | Status: DC
  Administered 2015-04-11 – 2015-04-12 (×2): 1 via ORAL
  Filled 2015-04-10 (×2): qty 1

## 2015-04-10 MED ORDER — SODIUM CHLORIDE 0.9 % IV SOLN
Freq: Once | INTRAVENOUS | Status: AC
  Administered 2015-04-10: 12:00:00 via INTRAVENOUS

## 2015-04-10 MED ORDER — INSULIN ASPART 100 UNIT/ML ~~LOC~~ SOLN
0.0000 [IU] | SUBCUTANEOUS | Status: DC
  Administered 2015-04-10: 5 [IU] via SUBCUTANEOUS
  Administered 2015-04-10: 11 [IU] via SUBCUTANEOUS
  Administered 2015-04-11: 2 [IU] via SUBCUTANEOUS
  Filled 2015-04-10 (×2): qty 1

## 2015-04-10 MED ORDER — INSULIN GLARGINE 100 UNIT/ML ~~LOC~~ SOLN
35.0000 [IU] | Freq: Every morning | SUBCUTANEOUS | Status: DC
  Administered 2015-04-11 – 2015-04-12 (×2): 35 [IU] via SUBCUTANEOUS
  Filled 2015-04-10 (×2): qty 0.35

## 2015-04-10 MED ORDER — ISOSORBIDE MONONITRATE ER 30 MG PO TB24
30.0000 mg | ORAL_TABLET | Freq: Every day | ORAL | Status: DC
  Administered 2015-04-10 – 2015-04-11 (×2): 30 mg via ORAL
  Filled 2015-04-10 (×2): qty 1

## 2015-04-10 MED ORDER — DILTIAZEM HCL ER COATED BEADS 180 MG PO CP24
180.0000 mg | ORAL_CAPSULE | Freq: Every day | ORAL | Status: DC
  Administered 2015-04-10 – 2015-04-11 (×2): 180 mg via ORAL
  Filled 2015-04-10 (×2): qty 1

## 2015-04-10 MED ORDER — ENOXAPARIN SODIUM 30 MG/0.3ML ~~LOC~~ SOLN
30.0000 mg | SUBCUTANEOUS | Status: DC
  Administered 2015-04-10 – 2015-04-11 (×2): 30 mg via SUBCUTANEOUS
  Filled 2015-04-10 (×2): qty 0.3

## 2015-04-10 MED ORDER — TAMSULOSIN HCL 0.4 MG PO CAPS
0.4000 mg | ORAL_CAPSULE | Freq: Every morning | ORAL | Status: DC
  Administered 2015-04-11 – 2015-04-12 (×2): 0.4 mg via ORAL
  Filled 2015-04-10 (×2): qty 1

## 2015-04-10 MED ORDER — STROKE: EARLY STAGES OF RECOVERY BOOK
Freq: Once | Status: AC
  Administered 2015-04-10: 20:00:00

## 2015-04-10 NOTE — Code Documentation (Signed)
82yo female arriving to [facility] at 68 via [place].  Patient from rehab facility where she was LKW at 0800.  Patient later noticed by staff to have slurred speech.  Of note, patient recent amputation of toes on the left foot.  EMS called and activated a code stroke.  Stroke team at the bedside on patient arrival.  Labs drawn and patient cleared by Dr. Maryan Rued.  Patient to CT and CT completed.  Patient to B15.  NIHSS 3, see documentation for details and code stroke times.  Patient with RUE ataxia, left mild facial droop, and decreased sensation in the right leg.  Dr. Silverio Decamp at the bedside.  No acute stroke treatment at this time, however, patient remains in the window to treat with tPA until 1230 should symptoms worsen.  Bedside handoff with ED RN [place].

## 2015-04-10 NOTE — Progress Notes (Signed)
ANTICOAGULATION CONSULT NOTE - Initial Consult  Pharmacy Consult for Lovenox Indication: VTE prophylaxis  Allergies  Allergen Reactions  . Corn-Containing Products   . Ephedrine     Unknown.    . Morphine Other (See Comments)    Difficulty waking up & talking after surgery  . Rocephin [Ceftriaxone]     Patient Measurements: Height: 5\' 6"  (167.6 cm) (taken on 04/10/15 @14 :10) Weight: 159 lb 3.2 oz (72.213 kg) (taken on 04/10/15 @14 :10) IBW/kg (Calculated) : 59.3   Vital Signs: Temp: 97.3 F (36.3 C) (03/16 1410) Temp Source: Oral (03/16 1410) BP: 156/55 mmHg (03/16 1800) Pulse Rate: 69 (03/16 1800)  Labs:  Recent Labs  04/08/15 04/08/15 0345 04/10/15 1007 04/10/15 1013  HGB 9.1* 9.1* 10.1* 11.6*  HCT 28* 28.4* 32.8* 34.0*  PLT 308 308 348  --   APTT  --   --  46*  --   LABPROT  --   --  15.1  --   INR  --   --  1.17  --   CREATININE  --   --  1.77* 1.70*    Estimated Creatinine Clearance: 21.1 mL/min (by C-G formula based on Cr of 1.7).   Medical History: Past Medical History  Diagnosis Date  . Myocardial infarction ([place])   . Coronary artery disease   . Hypertension   . Dysrhythmia   . Peripheral vascular disease ([place])   . Hypothyroidism   . Diabetes mellitus   . Presbyesophagus 04/23/2011  . Urinary retention with incomplete bladder emptying 04/23/2011  . UTI (lower urinary tract infection) 04/21/2011  . Diastolic CHF, chronic (HCC)     Grade I diastolic dysfunction on Echo 04/26/11  . Anaphylaxis 04/25/2011    Thought to be from Rocephin  . History of atrial flutter     ablation  . C. difficile colitis 04/17/2012  . Atrial flutter ([place]) 11/01/2012  . HCAP (healthcare-associated pneumonia) 10/21/2014  . Stroke [facility]) 2003    "lost the use of all of my right side"  . HOH (hard of hearing)   . Cellulitis   . CKD (chronic kidney disease), stage IV ([place])   . Expressive aphasia   . Hyperkalemia   . Hyponatremia     Medications:  Prescriptions prior to  admission  Medication Sig Dispense Refill Last Dose  . acetaminophen (TYLENOL) 325 MG tablet Take 325-650 mg by mouth every 6 (six) hours as needed for mild pain, moderate pain, fever or headache.   Past Month at Unknown time  . aspirin 325 MG tablet Take 325 mg by mouth daily. Take for 30 days starting 3/15, then dropping down to 81mg  daily.   04/09/2015 at Unknown time  . carvedilol (COREG) 6.25 MG tablet Take 6.25 mg by mouth 2 (two) times daily with a meal. Reported on 04/10/2015   04/10/2015 at 0800  . diltiazem (CARDIZEM CD) 180 MG 24 hr capsule Take 180 mg by mouth at bedtime.    04/09/2015 at Unknown time  . furosemide (LASIX) 20 MG tablet TAKE 1 TABLET BY MOUTH 2 TIMES DAILY.*PLEASE MAKE APPOINTMENT FOR REFILLS* 180 tablet 3 04/10/2015 at Unknown time  . gabapentin (NEURONTIN) 300 MG capsule Take 300 mg by mouth 3 (three) times daily.    04/10/2015 at Unknown time  . insulin aspart (NOVOLOG) 100 UNIT/ML injection Inject 2-12 Units into the skin 3 (three) times daily before meals. Give no units for BS 0-69 - hypoglycemic protocol & notify MD;  70-140 (0 units),141-180 (2 units), 181-220(4  units), 221-260 (6 units), 261-300(8 units), 301-340(10 units), >340 (12 units) notify MD   04/10/2015 at Unknown time  . insulin glargine (LANTUS) 100 UNIT/ML injection Inject 0.35 mLs (35 Units total) into the skin every morning.   04/10/2015 at Unknown time  . isosorbide mononitrate (IMDUR) 30 MG 24 hr tablet Take 30 mg by mouth at bedtime.    04/09/2015 at Unknown time  . levothyroxine (SYNTHROID, LEVOTHROID) 175 MCG tablet Take 175 mcg by mouth daily before breakfast.   04/10/2015 at Unknown time  . Multiple Vitamin (MULTIVITAMIN) tablet Take 1 tablet by mouth daily.   04/10/2015 at Unknown time  . Tamsulosin HCl (FLOMAX) 0.4 MG CAPS Take 0.4 mg by mouth every morning.    04/10/2015 at Unknown time  . VITAMIN E PO Take 1 capsule by mouth daily. 400 mg capsule for supplement   04/10/2015 at Unknown time     Assessment: 82 y.o female from rehab facility, admitted to [facility] with symptoms of CVA.  She has a h/o CVA with right side deficits.  CT head w/o contrast: Chronic atrophic and ischemic changes without acute abnormality. No melena or hematochezia, no dark tarry stools.  She has CKD stage IV, severe.  Pharmacy consulted to dose Lovenox SQ for DVT prophylaxis adjusting dosage for CrCl <30 ml/min.   Weight = 72kg  Goal of Therapy:  4 hour heparin level = 0.3-0.6 units/ml (heparin level monitoring is not necessary) Monitor platelets by anticoagulation protocol: Yes   Plan:  Lovenox 30 mg SQ q24h  No monitoring of heparin level necessary.  Pharmacy will sign off. Please reconsult pharmacy if we can assist you further.  Nicole Cella, RPh Clinical Pharmacist Pager: [phone] 04/10/2015,7:09 PM

## 2015-04-10 NOTE — ED Notes (Signed)
A. Ellis, NP, at bedside. 

## 2015-04-10 NOTE — ED Notes (Signed)
Attempted to call report

## 2015-04-10 NOTE — ED Notes (Signed)
IV team at bedside 

## 2015-04-10 NOTE — Progress Notes (Signed)
LOCATION: [place]  PCP: Jani Gravel, MD   Code Status: Full Code  Goals of care: Advanced Directive information Advanced Directives 04/10/2015  Does patient have an advance directive? No;Yes  Type of Paramedic of [place];Living will  Does patient want to make changes to advanced directive? -  Copy of advanced directive(s) in chart? -  Would patient like information on creating an advanced directive? Yes - Scientist, clinical (histocompatibility and immunogenetics) given       Extended Emergency Contact Information Primary Emergency Contact: Mahoney,Beverly Address: [place], [place] [address] [place] Phone: [phone] Mobile Phone: [phone] Relation: Daughter   Allergies  Allergen Reactions  . Corn-Containing Products   . Ephedrine     Unknown.    . Morphine Other (See Comments)    Difficulty waking up & talking after surgery  . Rocephin [Ceftriaxone]     Chief Complaint  Patient presents with  . New Admit To SNF    New Admission     HPI:  Patient is a 82 y.o. female seen today for short term rehabilitation post hospital admission with cellulitis of her right foot complicating her right foot wound and osteomyelitis. She was started on antibiotics and underwent transmetatarsal amputation of her right foot on 04/06/15. She has PMH of DM2 with peripheral neuropathy, hypertension, CAD, CVA with residual aphasia, atrial flutter S/P ablation, PAD, hypothyroid, chronic diastolic CHF among others. She is seen in her room today. She had a presyncopal episode during her bowel movement few minutes back. She has been brought to her bed now. Physical therapist and her nurse are present at bedside. Per staff, her speech is slurred and she appears confused compared to this am. She is not following commands and appears lethargic to me.    Review of Systems:  Constitutional: Negative for fever, chills. Positive for malaise.  HENT:  Negative for headache, congestion. Positive for dry mouth.   Eyes: positive for blurred vision.  Respiratory: Negative for cough and wheezing. Positive for shortness of breath.   Cardiovascular: Negative for chest pain, palpitations, leg swelling.  Gastrointestinal: Negative for heartburn, vomiting, abdominal pain. Had loose stool this am and has nausea along with abdominal discomfort.  Genitourinary: Negative for dysuria and flank pain.  Musculoskeletal: Negative for fall Skin: Negative for itching, rash.  Neurological: Positive for weakness and dizziness. Psychiatric/Behavioral: Negative for depression   Past Medical History  Diagnosis Date  . Myocardial infarction ([place])   . Coronary artery disease   . Hypertension   . Dysrhythmia   . Peripheral vascular disease ([place])   . Hypothyroidism   . Diabetes mellitus   . Presbyesophagus 04/23/2011  . Urinary retention with incomplete bladder emptying 04/23/2011  . UTI (lower urinary tract infection) 04/21/2011  . Diastolic CHF, chronic ([facility])     Grade I diastolic dysfunction on Echo 04/26/11  . Anaphylaxis 04/25/2011    Thought to be from Rocephin  . History of atrial flutter     ablation  . C. difficile colitis 04/17/2012  . Atrial flutter ([place]) 11/01/2012  . HCAP (healthcare-associated pneumonia) 10/21/2014  . Stroke [facility]) 2003    "lost the use of all of my right side"  . HOH (hard of hearing)   . Cellulitis   . CKD (chronic kidney disease), stage IV ([place])   . Expressive aphasia   . Hyperkalemia   . Hyponatremia    Past Surgical History  Procedure Laterality Date  . Cholecystectomy    . Colonoscopy  01/15/2011    Procedure: COLONOSCOPY;  Surgeon: Beryle Beams, MD;  Location: [facility] END[facility];  Service: Endoscopy;  Laterality: N/A;  . Amputation Right 04/14/2012    Procedure: AMPUTATION RAY;  Surgeon: Newt Minion, MD;  Location: [facility];  Service: Orthopedics;  Laterality: Right;  ankle block  . Cardiac electrophysiology mapping and  ablation    . Nm myocar perf wall motion  01/23/2009    mild ischemia mid inferior & apical inferior.  EF 75%  . Amputation Right 08/10/2014    Procedure: AMPUTATION RAY;  Surgeon: Mcarthur Rossetti, MD;  Location: [facility];  Service: Orthopedics;  Laterality: Right;  ankle block  . Appendectomy    . Shoulder surgery Right     "put back in place'  . Eye surgery Bilateral     Cararact  . I&d extremity Right 12/03/2014    Procedure: IRRIGATION AND DEBRIDEMENT RIGHT FOOT;  Surgeon: Mcarthur Rossetti, MD;  Location: [facility];  Service: Orthopedics;  Laterality: Right;  . Amputation Right 04/06/2015    Procedure: Transmetatarsal AMPUTATION RIGHT FOOT;  Surgeon: Mcarthur Rossetti, MD;  Location: [facility];  Service: Orthopedics;  Laterality: Right;   Social History:   reports that she quit smoking about 33 years ago. Her smoking use included Cigarettes. She has a 4 pack-year smoking history. She has never used smokeless tobacco. She reports that she drinks alcohol. She reports that she does not use illicit drugs.  Family History  Problem Relation Age of Onset  . Cancer Sister   . Diabetes Mother   . Heart disease Father     Medications: reviewed  Immunizations: Immunization History  Administered Date(s) Administered  . Influenza,inj,Quad PF,36+ Mos 11/19/2013  . Influenza-Unspecified 10/07/2014  . PPD Test 08/13/2014, 04/08/2015     Physical Exam: Filed Vitals:   04/10/15 1410  BP: 145/60  Pulse: 69  Temp: 97.3 F (36.3 C)  TempSrc: Oral  Resp: 16  Height: 5\' 6"  (1.676 m)  Weight: 159 lb 3.2 oz (72.213 kg)  SpO2: 92%   Body mass index is 25.71 kg/(m^2).  cbg 306  General- elderly female, well built, in mild distress Head- normocephalic, atraumatic Nose- no maxillary or frontal sinus tenderness, no nasal discharge Throat- dry mucus membrane  Eyes- PERRLA, EOMI, no pallor, no icterus Neck- no cervical lymphadenopathy Cardiovascular- normal s1,s2, no  murmur Respiratory- bilateral clear to auscultation, no wheeze, no rhonchi, no crackles, no use of accessory muscles Abdomen- bowel sounds present, soft, non tender, no guarding or rigidity, no CVA tenderness Musculoskeletal- able to move all 4 extremities, generalized weakness Neurological- alert and oriented to person only, slow to response and can follow simple command, slurred speech Skin- warm and dry   Labs reviewed: Basic Metabolic Panel:  Recent Labs  10/21/14 1826  04/05/15 0419  04/07/15 0341 04/10/15 1007 04/10/15 1013  NA 135  < > 136  < > 136 139 138  K 3.8  < > 4.6  < > 4.6 4.2 4.2  CL 102  < > 101  --  102 99* 96*  CO2 23  < > 25  --  27 28  --   GLUCOSE 328*  < > 250*  --  243* 315* 298*  BUN 51*  < > 53*  < > 55* 40* 40*  CREATININE 1.85*  < > 1.72*  < > 1.82* 1.77* 1.70*  CALCIUM 8.3*  < >  8.6*  --  8.1* 9.1  --   MG 1.6*  --   --   --   --   --   --   PHOS 3.0  --   --   --   --   --   --   < > = values in this interval not displayed. Liver Function Tests:  Recent Labs  10/21/14 1826 12/03/14 1303 04/10/15 1007  AST 15 19 15   ALT 12* 12* 10*  ALKPHOS 94 67 55  BILITOT 0.6 0.5 0.2*  PROT 7.0 7.2 6.5  ALBUMIN 2.4* 3.2* 2.6*   No results for input(s): LIPASE, AMYLASE in the last 8760 hours. No results for input(s): AMMONIA in the last 8760 hours. CBC:  Recent Labs  08/08/14 1411  04/04/15 1915  04/07/15 0341 04/08/15 04/08/15 0345 04/10/15 1007 04/10/15 1013  WBC 11.5*  < > 10.2  < > 10.7* 9.2 9.2 8.8  --   NEUTROABS 8.3*  --  6.8  --   --   --   --  5.3  --   HGB 10.3*  < > 11.8*  < > 9.2* 9.1* 9.1* 10.1* 11.6*  HCT 31.8*  < > 36.1  < > 28.4* 28* 28.4* 32.8* 34.0*  MCV 81.3  < > 82.6  < > 81.6  --  81.1 85.6  --   PLT 386  < > 306  < > 296 308 308 348  --   < > = values in this interval not displayed. Cardiac Enzymes:  Recent Labs  10/22/14 0513 10/22/14 1317 10/22/14 1818  TROPONINI 0.09* 0.04* 0.03   BNP: Invalid input(s):  POCBNP CBG:  Recent Labs  04/08/15 0748 04/08/15 1134 04/10/15 1219  GLUCAP 76 183* 329*    Radiological Exams: Ct Head Wo Contrast  04/10/2015  CLINICAL DATA:  Slurred speech EXAM: CT HEAD WITHOUT CONTRAST TECHNIQUE: Contiguous axial images were obtained from the base of the skull through the vertex without intravenous contrast. COMPARISON:  08/27/2013 FINDINGS: The bony calvarium is intact. No gross soft tissue abnormality is noted. Diffuse atrophic changes are noted. Areas of chronic white matter ischemic change are seen. No findings to suggest acute hemorrhage, acute infarction or space-occupying mass lesion are noted. IMPRESSION: Chronic atrophic and ischemic changes.  No acute abnormality noted. These results were called by telephone at the time of interpretation on 04/10/2015 at 10:38 am to Dr. Silverio Decamp, who verbally acknowledged these results. Electronically Signed   By: Inez Catalina M.D.   On: 04/10/2015 10:38   Dg Chest Port 1 View  04/10/2015  CLINICAL DATA:  Chronic nonproductive cough EXAM: PORTABLE CHEST 1 VIEW COMPARISON:  October 24, 2014 FINDINGS: There is no edema or consolidation. The heart size and pulmonary vascularity are normal. No adenopathy. No bone lesions. IMPRESSION: No edema or consolidation. Electronically Signed   By: Lowella Grip III M.D.   On: 04/10/2015 10:52   Dg Foot Complete Right  04/04/2015  CLINICAL DATA:  Diabetic foot wound. EXAM: RIGHT FOOT COMPLETE - 3+ VIEW COMPARISON:  08/08/2014 FINDINGS: Interval amputation of the great toe at the level of the distal metatarsal. There is erosive change of the medial cortex of the mid and proximal first metatarsal suggesting osteomyelitis. There is sclerotic periosteal reaction in this area consistent with chronic bony infection. Chronic fracture of the third metatarsal is unchanged. Prior amputation of the fourth toe is unchanged without evidence of osteomyelitis. IMPRESSION: Findings compatible with chronic  osteomyelitis of the  first metatarsal. Electronically Signed   By: Franchot Gallo M.D.   On: 04/04/2015 18:24    Assessment/Plan  Near syncope Had a presyncopal episode which likely is vasovagal as it happened while having a bowel movement. Will need to rule out neurologic etiology with her new neurological findings and hx of CVA.   Slurred speech With her worsening slurring of her speech and new onset generalized weakness along with her history of stroke and recent surgery, will need to rule out acute CVA/ TIA. Sending her to ED for further evaluation  Generalized weakness New onset, sending patient to ED for stroke workup and further evaluation  DM cbg reviewed at bedside. No hypoglycemia.      Family/ staff Communication: reviewed care plan with patient and nursing supervisor. EMS has been called and patient will be transported to the ED    [facility], MD Internal Medicine [place], [place] [address] Cell Phone (Monday-Friday 8 am - 5 pm): [phone] On Call: [phone] and follow prompts after 5 pm and on weekends Office Phone: [phone] Office Fax: [phone]

## 2015-04-10 NOTE — H&P (Signed)
[facility] History and Physical                                                                                    [name], is a 82 y.o. female  MRN: [id number]   DOB - 24-Jan-1920  Admit Date - 04/10/2015  Outpatient Primary MD for the patient is Jani Gravel, MD  Referring MD: Maryan Rued / ER  Consulting M.D: Silverio Decamp / Neurology  PMH: Past Medical History  Diagnosis Date  . Myocardial infarction ([place])   . Coronary artery disease   . Hypertension   . Dysrhythmia   . Peripheral vascular disease ([place])   . Hypothyroidism   . Diabetes mellitus   . Presbyesophagus 04/23/2011  . Urinary retention with incomplete bladder emptying 04/23/2011  . UTI (lower urinary tract infection) 04/21/2011  . Diastolic CHF, chronic (HCC)     Grade I diastolic dysfunction on Echo 04/26/11  . Anaphylaxis 04/25/2011    Thought to be from Rocephin  . History of atrial flutter     ablation  . C. difficile colitis 04/17/2012  . Atrial flutter ([place]) 11/01/2012  . HCAP (healthcare-associated pneumonia) 10/21/2014  . Stroke [facility]) 2003    "lost the use of all of my right side"  . HOH (hard of hearing)   . Cellulitis   . CKD (chronic kidney disease), stage IV ([place])   . Expressive aphasia   . Hyperkalemia   . Hyponatremia       PSH: Past Surgical History  Procedure Laterality Date  . Cholecystectomy    . Colonoscopy  01/15/2011    Procedure: COLONOSCOPY;  Surgeon: Beryle Beams, MD;  Location: [facility];  Service: Endoscopy;  Laterality: N/A;  . Amputation Right 04/14/2012    Procedure: AMPUTATION RAY;  Surgeon: Newt Minion, MD;  Location: [facility];  Service: Orthopedics;  Laterality: Right;  ankle block  . Cardiac electrophysiology mapping and ablation    . Nm myocar perf wall motion  01/23/2009    mild ischemia mid inferior & apical inferior.  EF 75%  . Amputation Right 08/10/2014    Procedure: AMPUTATION RAY;  Surgeon: Mcarthur Rossetti, MD;  Location: [facility];  Service: Orthopedics;   Laterality: Right;  ankle block  . Appendectomy    . Shoulder surgery Right     "put back in place'  . Eye surgery Bilateral     Cararact  . I&d extremity Right 12/03/2014    Procedure: IRRIGATION AND DEBRIDEMENT RIGHT FOOT;  Surgeon: Mcarthur Rossetti, MD;  Location: [place];  Service: Orthopedics;  Laterality: Right;  . Amputation Right 04/06/2015    Procedure: Transmetatarsal AMPUTATION RIGHT FOOT;  Surgeon: Mcarthur Rossetti, MD;  Location: [facility];  Service: Orthopedics;  Laterality: Right;     CC:  Chief Complaint  Patient presents with  . Code Stroke     HPI: 82 year old female patient with history of prior CVA with apparent right-sided deficits and minor residual expressive aphasia who was recently discharged on 3/14 after admission for cellulitis of the right foot with chronic right foot wound with osteomyelitis subsequently underwent transmetatarsal amputation of right foot on 3/12. Asian also  has a history of diabetes on insulin, atrial flutter not on anticoagulation secondary to advanced age and fall risk, stage IV chronic kidney disease, hypothyroidism, hypertension, chronic LV diastolic dysfunction and anemia of chronic disease. Patient was sent to the ER today after having symptoms that were concerning for possible new stroke. Patient was assisted to ambulate to a bedside commode using a walker to pivot and sit. Prior to sitting down and before beginning any elimination patient said she did not feel right and after sitting back down on the bed became nauseous and had 1 episode of vomiting. She has not had any abdominal pain or GI symptomatology before since. At that time staff noticed that she had left eye and left facial drooping as well as significant dysarthria much worse than her baseline. Patient apparently did not have any extremity weakness different from her baseline. EMS was called to the facility and the patient was transported to the hospital. Neurology was  consulted once the patient arrived to the ER and a code stroke was initiated.  ER Evaluation and treatment: Afebrile, initial BP was 97/80, pulse 68 and respirations 22-repeat blood pressure 166/52. EKG: Sinus rhythm with ventricular rate 72 bpm, QTC 463 ms, no ischemic changes, unchanged from previous EKG. CT head without contrast: Chronic atrophic and ischemic changes without acute abnormality PCXR: No edema or consolidation Laboratory data: Na 138, K 4.2, BUN 40, Cr 1.70, glucose 298, troponin 0.01, WBC 8800 without left shift on differential, hemoglobin 10.1, alcohol less than 5 IV normal saline 500 mL to infuse at 50 mL per hour NovoLog regular insulin 11 units subcutaneous 1  Review of Systems   In addition to the HPI above,  No Fever-chills, myalgias or other constitutional symptoms No Headache, changes with Vision or hearing, new weakness, tingling, numbness in any extremity, although she has had worsened dysarthria with some expressive aphasia as evidenced by difficulty with word finding, she has also had left eye drooping and facial drooping that has improved but not resolved since arrival No problems swallowing food or Liquids, indigestion/reflux No Chest pain, Cough or Shortness of Breath, palpitations, orthopnea or DOE No Abdominal pain, N/V; no melena or hematochezia, no dark tarry stools No dysuria, hematuria or flank pain No new skin rashes, lesions, masses or bruises, No new joints pains-aches No recent weight gain or loss No polyuria, polydypsia or polyphagia,  *A full 10 point Review of Systems was done, except as stated above, all other Review of Systems were negative.  Social History Social History  Substance Use Topics  . Smoking status: Former Smoker -- 0.10 packs/day for 40 years    Types: Cigarettes    Quit date: 04/20/1981  . Smokeless tobacco: Never Used  . Alcohol Use: Yes     Comment: hx social    Resides at: [facility]  Lives with: N/A  Ambulatory status: Essentially nonambulatory since stroke although able to stand and pivot with 2+ assist and rolling walker   Family History Family History  Problem Relation Age of Onset  . Cancer Sister   . Diabetes Mother   . Heart disease Father      Prior to Admission medications   Medication Sig Start Date End Date Taking? Authorizing Provider  acetaminophen (TYLENOL) 325 MG tablet Take 325-650 mg by mouth every 6 (six) hours as needed for mild pain, moderate pain, fever or headache.    Historical Provider, MD  aspirin EC 325 MG tablet Take 1 tablet (325  mg total) by mouth daily. 325 MG daily 1 month for postoperative DVT prophylaxis, then switch back to prior home dose of 81 MG daily. 04/08/15   Modena Jansky, MD  aspirin EC 81 MG tablet Take 81 mg by mouth daily.    Historical Provider, MD  carvedilol (COREG) 6.25 MG tablet Take 6.25 mg by mouth 2 (two) times daily with a meal.      Historical Provider, MD  diltiazem (CARDIZEM CD) 180 MG 24 hr capsule Take 180 mg by mouth at bedtime.     Historical Provider, MD  furosemide (LASIX) 20 MG tablet TAKE 1 TABLET BY MOUTH 2 TIMES DAILY.*PLEASE MAKE APPOINTMENT FOR REFILLS* 10/25/14   Donne Hazel, MD  gabapentin (NEURONTIN) 300 MG capsule Take 300 mg by mouth 3 (three) times daily.  02/17/12   Ivan Anchors Love, PA-C  insulin aspart (NOVOLOG) 100 UNIT/ML injection Inject 2-12 Units into the skin 3 (three) times daily before meals. Give no units for BS 0-69 - hypoglycemic protocol & notify MD;  70-140 (0 units),141-180 (2 units), 181-220(4 units), 221-260 (6 units), 261-300(8 units), 301-340(10 units), >340 (12 units) notify MD    Historical Provider, MD  insulin glargine (LANTUS) 100 UNIT/ML injection Inject 0.35 mLs (35 Units total) into the skin every morning. 04/08/15   Modena Jansky, MD  isosorbide mononitrate (IMDUR) 30 MG 24 hr tablet Take 30 mg by mouth at bedtime.     Historical Provider, MD   levothyroxine (SYNTHROID, LEVOTHROID) 175 MCG tablet Take 175 mcg by mouth daily before breakfast.    Historical Provider, MD  Multiple Vitamin (MULTIVITAMIN) tablet Take 1 tablet by mouth daily.    Historical Provider, MD  Tamsulosin HCl (FLOMAX) 0.4 MG CAPS Take 0.4 mg by mouth every morning.     Historical Provider, MD  VITAMIN E PO Take 1 capsule by mouth daily. 400 mg capsule for supplement    Historical Provider, MD    Allergies  Allergen Reactions  . Corn-Containing Products   . Ephedrine     Unknown.    . Morphine Other (See Comments)    Difficulty waking up & talking after surgery  . Rocephin [Ceftriaxone]     Physical Exam  Vitals  Blood pressure 148/46, pulse 69, temperature 97.2 F (36.2 C), temperature source Oral, resp. rate 19, SpO2 95 %.   General:  In no acute distress, appears stated age  Psych:  Normal affect, Denies Suicidal or Homicidal ideations, Awake Alert, Oriented X 3.   Neuro:  CN II through XII intact except for persistent left facial drooping as evidenced by drooping at mouth while speaking-previous left eye ptosis observed upon arrival to ER has resolved, Strength 4-5/5 all 4 extremities right side being slightly weaker, Sensation intact all 4 extremities.  ENT:  Ears and Eyes appear Normal, Conjunctivae clear, PER. Dry oral mucosa without erythema or exudates. Wears dentures  Neck:  Supple, No lymphadenopathy appreciated  Respiratory:  Symmetrical chest wall movement, Good air movement bilaterally, CTAB. Room Air  Cardiac:  RRR, No Murmurs, no LE edema noted, no JVD, No carotid bruits, peripheral pulses palpable at 2+  Abdomen:  Positive bowel sounds, Soft, Non tender, Non distended,  No masses appreciated, no obvious hepatosplenomegaly  Skin:  No Cyanosis, Normal Skin Turgor, No Skin Rash or Bruise.  Extremities: Symmetrical except for recent transmetatarsal amputation on right with noted occlusive Coban dressing in place with slight edema  above dressing-no drainage noted through dressing,  no effusions.  Data Review  CBC  Recent Labs Lab 04/04/15 1915 04/05/15 0419 04/07/15 0341 04/08/15 0345 04/10/15 1007 04/10/15 1013  WBC 10.2 8.8 10.7* 9.2 8.8  --   HGB 11.8* 10.6* 9.2* 9.1* 10.1* 11.6*  HCT 36.1 33.4* 28.4* 28.4* 32.8* 34.0*  PLT 306 271 296 308 348  --   MCV 82.6 85.9 81.6 81.1 85.6  --   MCH 27.0 27.2 26.4 26.0 26.4  --   MCHC 32.7 31.7 32.4 32.0 30.8  --   RDW 13.7 14.0 13.9 13.9 14.3  --   LYMPHSABS 2.0  --   --   --  2.1  --   MONOABS 1.0  --   --   --  0.7  --   EOSABS 0.3  --   --   --  0.5  --   BASOSABS 0.1  --   --   --  0.1  --     Chemistries   Recent Labs Lab 04/04/15 1915 04/05/15 0419 04/07/15 0341 04/10/15 1007 04/10/15 1013  NA 134* 136 136 139 138  K 5.4* 4.6 4.6 4.2 4.2  CL 96* 101 102 99* 96*  CO2 28 25 27 28   --   GLUCOSE 219* 250* 243* 315* 298*  BUN 54* 53* 55* 40* 40*  CREATININE 1.76* 1.72* 1.82* 1.77* 1.70*  CALCIUM 9.1 8.6* 8.1* 9.1  --   AST  --   --   --  15  --   ALT  --   --   --  10*  --   ALKPHOS  --   --   --  55  --   BILITOT  --   --   --  0.2*  --     estimated creatinine clearance is 20.9 mL/min (by C-G formula based on Cr of 1.7).  No results for input(s): TSH, T4TOTAL, T3FREE, THYROIDAB in the last 72 hours.  Invalid input(s): FREET3  Coagulation profile  Recent Labs Lab 04/10/15 1007  INR 1.17    No results for input(s): DDIMER in the last 72 hours.  Cardiac Enzymes No results for input(s): CKMB, TROPONINI, MYOGLOBIN in the last 168 hours.  Invalid input(s): CK  Invalid input(s): POCBNP  Urinalysis    Component Value Date/Time   COLORURINE YELLOW 10/21/2014 1415   APPEARANCEUR HAZY* 10/21/2014 1415   LABSPEC 1.012 10/21/2014 1415   PHURINE 5.5 10/21/2014 1415   GLUCOSEU 100* 10/21/2014 1415   HGBUR MODERATE* 10/21/2014 1415   BILIRUBINUR NEGATIVE 10/21/2014 1415   KETONESUR NEGATIVE 10/21/2014 1415   PROTEINUR 100*  10/21/2014 1415   UROBILINOGEN 0.2 10/21/2014 1415   NITRITE NEGATIVE 10/21/2014 1415   LEUKOCYTESUR SMALL* 10/21/2014 1415    Imaging results:   Ct Head Wo Contrast  04/10/2015  CLINICAL DATA:  Slurred speech EXAM: CT HEAD WITHOUT CONTRAST TECHNIQUE: Contiguous axial images were obtained from the base of the skull through the vertex without intravenous contrast. COMPARISON:  08/27/2013 FINDINGS: The bony calvarium is intact. No gross soft tissue abnormality is noted. Diffuse atrophic changes are noted. Areas of chronic white matter ischemic change are seen. No findings to suggest acute hemorrhage, acute infarction or space-occupying mass lesion are noted. IMPRESSION: Chronic atrophic and ischemic changes.  No acute abnormality noted. These results were called by telephone at the time of interpretation on 04/10/2015 at 10:38 am to Dr. Silverio Decamp, who verbally acknowledged these results. Electronically Signed   By: Inez Catalina M.D.   On: 04/10/2015 10:38  Dg Chest Port 1 View  04/10/2015  CLINICAL DATA:  Chronic nonproductive cough EXAM: PORTABLE CHEST 1 VIEW COMPARISON:  October 24, 2014 FINDINGS: There is no edema or consolidation. The heart size and pulmonary vascularity are normal. No adenopathy. No bone lesions. IMPRESSION: No edema or consolidation. Electronically Signed   By: Lowella Grip III M.D.   On: 04/10/2015 10:52   Dg Foot Complete Right  04/04/2015  CLINICAL DATA:  Diabetic foot wound. EXAM: RIGHT FOOT COMPLETE - 3+ VIEW COMPARISON:  08/08/2014 FINDINGS: Interval amputation of the great toe at the level of the distal metatarsal. There is erosive change of the medial cortex of the mid and proximal first metatarsal suggesting osteomyelitis. There is sclerotic periosteal reaction in this area consistent with chronic bony infection. Chronic fracture of the third metatarsal is unchanged. Prior amputation of the fourth toe is unchanged without evidence of osteomyelitis. IMPRESSION:  Findings compatible with chronic osteomyelitis of the first metatarsal. Electronically Signed   By: Franchot Gallo M.D.   On: 04/04/2015 18:24     EKG: (Independently reviewed) Sinus rhythm with ventricular rate 72 bpm, QTC 463 ms, no ischemic changes, unchanged from previous EKG.   Assessment & Plan  Principal Problem:   CVA /History of CVA w/ right side deficits -Although symptoms improving have not resolved which are concerning more for actual stroke as opposed to TIA but we'll continue to monitor -Admit to telemetry/Obs -Stat MRI/MRA brain -Bilateral carotid duplex -Echocardiogram -Hemoglobin A1c and lipid panel -PT/OT/SLP -Continue full dose aspirin-may need additional antiplatelet such as Plavix but will defer to neurology -Appreciate neurology assistance  Active Problems:   HYPERTENSION, BENIGN ESSENTIAL -Upon initial presentation patient's blood pressure appeared to be suboptimal with readings in the 90s; this may be suggestive of volume depletion and degree of orthostasis contributing to some of patient's symptomatology -Blood pressure has rebounded to hypertensive range so for now we'll continue preadmission medications of Imdur, Cardizem and carvedilol (she also takes Flomax for ? Overactive bladder)     Chronic kidney disease, stage IV (severe)  -Renal function stable and at baseline also has persistently elevated BUN in setting of continued use of diuretic  -Concern over volume depletion so diuretic on hold and low IV fluids continued     Diabetes mellitus with renal manifestations, uncontrolled  -CBGs have been between 298 and 315 since arrival  -Continue preadmission Lantus  -Follow CBGs and provide SSI  -Follow up on hemoglobin A1c     Left ventricular diastolic dysfunction, NYHA class 2 -Currently patient compensated -Given advanced age and underlying poor renal function would likely only utilize Lasix based on weight gain and not give daily recommend  reevaluating prior to discharge -Follow up on echocardiogram this admission    Hypothyroidism -Continue Synthroid  -TSH November 2016 was 10.224 therefore will repeat this admission     Coronary atherosclerosis of native coronary artery -Has not expressed typical cardiac ischemic symptoms such as chest pain and does not mobilize enough to report dyspnea on exertion  -Prior to recent surgery was on the 1 mg aspirin at this was temporarily increased to 325 mg to utilize his DVT prophylaxis postoperative recent transmetatarsal amputation  -Continue preadmission carvedilol and Imdur  -Was not on statin therapy or omega-3 fatty acids     S/P transmetatarsal amputation of right foot  -Dressing intact  -Consider consulting Dr. Sharol Given for any concerns regarding the postoperative wound     DVT Prophylaxis: renally adjusted Lovenox   Family Communication:  No family at bedside-patient reportedly has legal guardian with daughter listed as contact person per social work notes from last admission  Code Status:   Full code  Condition:   Stable  Discharge disposition: anticipate discharge back to skilled nursing facility once medically stable   Time spent in minutes : 60      ELLIS,ALLISON L. ANP on 04/10/2015 at 1:19 PM  You may contact me by going to [email or web address] - password [email or web address]  I am available from 7a-7p but please confirm I am on the schedule by going to Amion as above.   After 7p please contact night coverage person covering me after hours  [facility]    Patient seen and examined. Agree with above note by Erin Hearing, NP. Patient admitted earlier today from SNF after she was found to be dysarthric and with a left facial droop. She was recently discharged from [facility] on 3/13 status post a right foot transmetatarsal amputation. Code stroke was called on arrival. CT head was negative for acute findings. Deficits have not resolved as of yet. Will admit to telemetry,  obtain stroke workup continue full dose aspirin for secondary stroke prevention. We'll continue to follow.  Domingo Mend, MD [facility] Pager: [phone]

## 2015-04-10 NOTE — ED Notes (Signed)
Pt arrives from [facility] post toe amputations as a code stroke.  EMS reports pt seen by nursing at staff at 0800, pt reported normal.  EMS reports pt helped by staff to toilet at 0900, staff reports near syncopal episode with vomiting and new with slurred speech.  EMS reports no other deficits. Pt reports hx CVA.

## 2015-04-10 NOTE — ED Notes (Signed)
Pt's Daughter at bedside, pt at MRI.

## 2015-04-10 NOTE — ED Provider Notes (Addendum)
CSN: [id number]     Arrival date & time 04/10/15  1003 History   First MD Initiated Contact with Patient 04/10/15 1005     No chief complaint on file.    (Consider location/radiation/quality/duration/timing/severity/associated sxs/prior Treatment) HPI Comments: Patient is a 82 year old female with a history of prior stroke, diabetes, hypertension who recently had a right toe amputation 2 days ago presents today from nursing facility with concern for a code stroke. Patient got up to go to the bathroom around 9:00 and had a near syncopal event while in the bathroom. Since that time facility states she had slurred speech. Upon arrival here patient is speaking and able to answer questions. She denies any new numbness, weakness or swallowing difficulty. She states she has had a cough but denies any shortness of breath.  The history is provided by the patient and the EMS personnel.    Past Medical History  Diagnosis Date  . Myocardial infarction ([place])   . Coronary artery disease   . Hypertension   . Dysrhythmia   . Peripheral vascular disease ([place])   . Hypothyroidism   . Diabetes mellitus   . Presbyesophagus 04/23/2011  . Urinary retention with incomplete bladder emptying 04/23/2011  . UTI (lower urinary tract infection) 04/21/2011  . Diastolic CHF, chronic ([facility])     Grade I diastolic dysfunction on Echo 04/26/11  . Anaphylaxis 04/25/2011    Thought to be from Rocephin  . History of atrial flutter     ablation  . C. difficile colitis 04/17/2012  . Atrial flutter ([place]) 11/01/2012  . HCAP (healthcare-associated pneumonia) 10/21/2014  . Stroke [facility]) 2003    "lost the use of all of my right side"  . HOH (hard of hearing)   . Cellulitis   . CKD (chronic kidney disease), stage IV ([place])   . Expressive aphasia   . Hyperkalemia   . Hyponatremia    Past Surgical History  Procedure Laterality Date  . Cholecystectomy    . Colonoscopy  01/15/2011    Procedure: COLONOSCOPY;  Surgeon: Beryle Beams, MD;  Location: [facility];  Service: Endoscopy;  Laterality: N/A;  . Amputation Right 04/14/2012    Procedure: AMPUTATION RAY;  Surgeon: Newt Minion, MD;  Location: [facility];  Service: Orthopedics;  Laterality: Right;  ankle block  . Cardiac electrophysiology mapping and ablation    . Nm myocar perf wall motion  01/23/2009    mild ischemia mid inferior & apical inferior.  EF 75%  . Amputation Right 08/10/2014    Procedure: AMPUTATION RAY;  Surgeon: Mcarthur Rossetti, MD;  Location: [facility];  Service: Orthopedics;  Laterality: Right;  ankle block  . Appendectomy    . Shoulder surgery Right     "put back in place'  . Eye surgery Bilateral     Cararact  . I&d extremity Right 12/03/2014    Procedure: IRRIGATION AND DEBRIDEMENT RIGHT FOOT;  Surgeon: Mcarthur Rossetti, MD;  Location: [facility];  Service: Orthopedics;  Laterality: Right;  . Amputation Right 04/06/2015    Procedure: Transmetatarsal AMPUTATION RIGHT FOOT;  Surgeon: Mcarthur Rossetti, MD;  Location: [facility];  Service: Orthopedics;  Laterality: Right;   Family History  Problem Relation Age of Onset  . Cancer Sister   . Diabetes Mother   . Heart disease Father    Social History  Substance Use Topics  . Smoking status: Former Smoker -- 0.10 packs/day for 40 years    Types: Cigarettes    Quit  date: 04/20/1981  . Smokeless tobacco: Never Used  . Alcohol Use: Yes     Comment: hx social   OB History    No data available     Review of Systems  Respiratory: Positive for cough. Negative for shortness of breath.   Musculoskeletal:       Toe amputation this week for osteomyelitis  All other systems reviewed and are negative.     Allergies  Corn-containing products; Ephedrine; Morphine; and Rocephin  Home Medications   Prior to Admission medications   Medication Sig Start Date End Date Taking? Authorizing Provider  acetaminophen (TYLENOL) 325 MG tablet Take 325-650 mg by mouth every 6 (six) hours as needed  for mild pain, moderate pain, fever or headache.    Historical Provider, MD  aspirin EC 325 MG tablet Take 1 tablet (325 mg total) by mouth daily. 325 MG daily 1 month for postoperative DVT prophylaxis, then switch back to prior home dose of 81 MG daily. 04/08/15   Modena Jansky, MD  aspirin EC 81 MG tablet Take 81 mg by mouth daily.    Historical Provider, MD  carvedilol (COREG) 6.25 MG tablet Take 6.25 mg by mouth 2 (two) times daily with a meal.      Historical Provider, MD  diltiazem (CARDIZEM CD) 180 MG 24 hr capsule Take 180 mg by mouth at bedtime.     Historical Provider, MD  furosemide (LASIX) 20 MG tablet TAKE 1 TABLET BY MOUTH 2 TIMES DAILY.*PLEASE MAKE APPOINTMENT FOR REFILLS* 10/25/14   Donne Hazel, MD  gabapentin (NEURONTIN) 300 MG capsule Take 300 mg by mouth 3 (three) times daily.  02/17/12   Ivan Anchors Love, PA-C  insulin aspart (NOVOLOG) 100 UNIT/ML injection Inject 2-12 Units into the skin 3 (three) times daily before meals. Give no units for BS 0-69 - hypoglycemic protocol & notify MD;  70-140 (0 units),141-180 (2 units), 181-220(4 units), 221-260 (6 units), 261-300(8 units), 301-340(10 units), >340 (12 units) notify MD    Historical Provider, MD  insulin glargine (LANTUS) 100 UNIT/ML injection Inject 0.35 mLs (35 Units total) into the skin every morning. 04/08/15   Modena Jansky, MD  isosorbide mononitrate (IMDUR) 30 MG 24 hr tablet Take 30 mg by mouth at bedtime.     Historical Provider, MD  levothyroxine (SYNTHROID, LEVOTHROID) 175 MCG tablet Take 175 mcg by mouth daily before breakfast.    Historical Provider, MD  Multiple Vitamin (MULTIVITAMIN) tablet Take 1 tablet by mouth daily.    Historical Provider, MD  Tamsulosin HCl (FLOMAX) 0.4 MG CAPS Take 0.4 mg by mouth every morning.     Historical Provider, MD  VITAMIN E PO Take 1 capsule by mouth daily. 400 mg capsule for supplement    Historical Provider, MD   There were no vitals taken for this visit. Physical Exam   Constitutional: She is oriented to person, place, and time. She appears well-developed and well-nourished. No distress.  HENT:  Head: Normocephalic and atraumatic.  Dry mucous membranes  Eyes: Conjunctivae and EOM are normal. Pupils are equal, round, and reactive to light.  Neck: Normal range of motion. Neck supple.  Cardiovascular: Normal rate, regular rhythm and intact distal pulses.   No murmur heard. Pulmonary/Chest: Effort normal. No respiratory distress. She has wheezes. She has no rales.  Abdominal: Soft. She exhibits no distension. There is no tenderness. There is no rebound and no guarding.  Musculoskeletal: Normal range of motion. She exhibits no edema or tenderness.  Right  foot in [employer]  No leg edema or erythema  Neurological: She is alert and oriented to person, place, and time.  Mild slurred speech, dry mouth, right hand ataxia and decreased sensation on the right.  NIH 3  Skin: Skin is warm and dry. No rash noted. No erythema.  Psychiatric: She has a normal mood and affect. Her behavior is normal.  Nursing note and vitals reviewed.   ED Course  Procedures (including critical care time) Labs Review Labs Reviewed  APTT - Abnormal; Notable for the following:    aPTT 46 (*)    All other components within normal limits  CBC - Abnormal; Notable for the following:    RBC 3.83 (*)    Hemoglobin 10.1 (*)    HCT 32.8 (*)    All other components within normal limits  COMPREHENSIVE METABOLIC PANEL - Abnormal; Notable for the following:    Chloride 99 (*)    Glucose, Bld 315 (*)    BUN 40 (*)    Creatinine, Ser 1.77 (*)    Albumin 2.6 (*)    ALT 10 (*)    Total Bilirubin 0.2 (*)    GFR calc non Af Amer 24 (*)    GFR calc Af Amer 27 (*)    All other components within normal limits  I-STAT CHEM 8, ED - Abnormal; Notable for the following:    Chloride 96 (*)    BUN 40 (*)    Creatinine, Ser 1.70 (*)    Glucose, Bld 298 (*)    Calcium, Ion 1.11 (*)    Hemoglobin  11.6 (*)    HCT 34.0 (*)    All other components within normal limits  URINE CULTURE  ETHANOL  PROTIME-INR  DIFFERENTIAL  URINALYSIS, ROUTINE W REFLEX MICROSCOPIC (NOT AT [facility])  HEMOGLOBIN A1C  I-STAT TROPOININ, ED    Imaging Review Ct Head Wo Contrast  04/10/2015  CLINICAL DATA:  Slurred speech EXAM: CT HEAD WITHOUT CONTRAST TECHNIQUE: Contiguous axial images were obtained from the base of the skull through the vertex without intravenous contrast. COMPARISON:  08/27/2013 FINDINGS: The bony calvarium is intact. No gross soft tissue abnormality is noted. Diffuse atrophic changes are noted. Areas of chronic white matter ischemic change are seen. No findings to suggest acute hemorrhage, acute infarction or space-occupying mass lesion are noted. IMPRESSION: Chronic atrophic and ischemic changes.  No acute abnormality noted. These results were called by telephone at the time of interpretation on 04/10/2015 at 10:38 am to Dr. Silverio Decamp, who verbally acknowledged these results. Electronically Signed   By: Inez Catalina M.D.   On: 04/10/2015 10:38   Dg Chest Port 1 View  04/10/2015  CLINICAL DATA:  Chronic nonproductive cough EXAM: PORTABLE CHEST 1 VIEW COMPARISON:  October 24, 2014 FINDINGS: There is no edema or consolidation. The heart size and pulmonary vascularity are normal. No adenopathy. No bone lesions. IMPRESSION: No edema or consolidation. Electronically Signed   By: Lowella Grip III M.D.   On: 04/10/2015 10:52   I have personally reviewed and evaluated these images and lab results as part of my medical decision-making.   EKG Interpretation   Date/Time:  Thursday April 10 2015 10:19:35 EDT Ventricular Rate:  72 PR Interval:  194 QRS Duration: 89 QT Interval:  423 QTC Calculation: 463 R Axis:   79 Text Interpretation:  Sinus rhythm Anterior infarct, old Baseline wander  in lead(s) V2 No significant change since last tracing Confirmed by  [facility]  MD, Jeanny Rymer ([id number])  on  04/10/2015 11:15:54 AM      MDM   Final diagnoses:  Dehydration  Transient cerebral ischemia, unspecified transient cerebral ischemia type  Near syncope   Patient is a 82 year old female being brought in today for concern for stroke. Patient initially was made a code stroke for symptoms starting at approximately between 8 and 9 AM. Patient was seen at 8 AM and was normal and then at 9 AM got up to go to the bathroom had a near syncopal event but has had slurred speech since.  Patient has no complaints at this time. She feels her voice is normal and states that she has ongoing issues with her right side since she had a stroke 5 years ago. However of note patient was recently hospitalized for healthcare acquired pneumonia as well as underwent surgery for osteomyelitis of her second right toe amputation several days ago. Does not appear the patient's on any pain medication other than Tylenol. Patient is an NIH of 3 for decreased sensation on the right side, ataxia and abnormal speech. Patient was seen by neurology and they recommended keeping her in the window at this time but she is not a candidate for TPA given her recent surgery this week. She was given aspirin and requested admission    Blanchie Dessert, MD 04/10/15 [place], MD 04/10/15 1158

## 2015-04-10 NOTE — Consult Note (Signed)
Requesting Physician: Dr.  Maryan Rued    Reason for consultation: Stroke code   HPI:                                                                                                                                         [name] is an 82 y.o. female patient who presented from a skilled nursing facility for further evaluation of slurred speech symptoms. Her last normal was believed to be at 8 AM. She has a history of chronic stroke affecting her right side with baseline right arm and leg weakness, and right upper extremity incoordination. She has chronic numbness in the right lower extremity. She recently underwent right foot metatarsal amputation 3 days ago. She is at her baseline now during the evaluation the ER. She is able to provide coherent history, and denies any new neurological symptoms.  She has a history of atrial flutter/fibrillation but not on anticoagulation due to falls risk, on aspirin 325 mg daily.   Date last known well:  04/10/2015 Time last known well:  8 A.m. tPA Given: No: Recent right foot metatarsal amputation surgery 4 days ago, no definite clinical evidence for acute stroke  Stroke Risk Factors - atrial fibrillation, diabetes mellitus and hypertension  Past Medical History: Past Medical History  Diagnosis Date  . Myocardial infarction ([place])   . Coronary artery disease   . Hypertension   . Dysrhythmia   . Peripheral vascular disease ([place])   . Hypothyroidism   . Diabetes mellitus   . Presbyesophagus 04/23/2011  . Urinary retention with incomplete bladder emptying 04/23/2011  . UTI (lower urinary tract infection) 04/21/2011  . Diastolic CHF, chronic ([facility])     Grade I diastolic dysfunction on Echo 04/26/11  . Anaphylaxis 04/25/2011    Thought to be from Rocephin  . History of atrial flutter     ablation  . C. difficile colitis 04/17/2012  . Atrial flutter ([place]) 11/01/2012  . HCAP (healthcare-associated pneumonia) 10/21/2014  . Stroke [facility]) 2003    "lost the use  of all of my right side"  . HOH (hard of hearing)   . Cellulitis   . CKD (chronic kidney disease), stage IV ([place])   . Expressive aphasia   . Hyperkalemia   . Hyponatremia     Past Surgical History  Procedure Laterality Date  . Cholecystectomy    . Colonoscopy  01/15/2011    Procedure: COLONOSCOPY;  Surgeon: Beryle Beams, MD;  Location: [facility];  Service: Endoscopy;  Laterality: N/A;  . Amputation Right 04/14/2012    Procedure: AMPUTATION RAY;  Surgeon: Newt Minion, MD;  Location: [facility];  Service: Orthopedics;  Laterality: Right;  ankle block  . Cardiac electrophysiology mapping and ablation    . Nm myocar perf wall motion  01/23/2009    mild ischemia mid inferior & apical inferior.  EF 75%  . Amputation Right  08/10/2014    Procedure: AMPUTATION RAY;  Surgeon: Mcarthur Rossetti, MD;  Location: [facility];  Service: Orthopedics;  Laterality: Right;  ankle block  . Appendectomy    . Shoulder surgery Right     "put back in place'  . Eye surgery Bilateral     Cararact  . I&d extremity Right 12/03/2014    Procedure: IRRIGATION AND DEBRIDEMENT RIGHT FOOT;  Surgeon: Mcarthur Rossetti, MD;  Location: [facility];  Service: Orthopedics;  Laterality: Right;  . Amputation Right 04/06/2015    Procedure: Transmetatarsal AMPUTATION RIGHT FOOT;  Surgeon: Mcarthur Rossetti, MD;  Location: [facility];  Service: Orthopedics;  Laterality: Right;    Family History: Family History  Problem Relation Age of Onset  . Cancer Sister   . Diabetes Mother   . Heart disease Father     Social History:   reports that she quit smoking about 33 years ago. Her smoking use included Cigarettes. She has a 4 pack-year smoking history. She has never used smokeless tobacco. She reports that she drinks alcohol. She reports that she does not use illicit drugs.  Allergies:  Allergies  Allergen Reactions  . Corn-Containing Products   . Ephedrine     Unknown.    . Morphine Other (See Comments)     Difficulty waking up & talking after surgery  . Rocephin [Ceftriaxone]      Medications:                                                                                                                         Current facility-administered medications:  .  0.9 %  sodium chloride infusion, , Intravenous, Once, Joelie Schou Fuller Mandril, MD  Current outpatient prescriptions:  .  acetaminophen (TYLENOL) 325 MG tablet, Take 325-650 mg by mouth every 6 (six) hours as needed for mild pain, moderate pain, fever or headache., Disp: , Rfl:  .  aspirin EC 325 MG tablet, Take 1 tablet (325 mg total) by mouth daily. 325 MG daily 1 month for postoperative DVT prophylaxis, then switch back to prior home dose of 81 MG daily., Disp: , Rfl:  .  aspirin EC 81 MG tablet, Take 81 mg by mouth daily., Disp: , Rfl:  .  carvedilol (COREG) 6.25 MG tablet, Take 6.25 mg by mouth 2 (two) times daily with a meal.  , Disp: , Rfl:  .  diltiazem (CARDIZEM CD) 180 MG 24 hr capsule, Take 180 mg by mouth at bedtime. , Disp: , Rfl:  .  furosemide (LASIX) 20 MG tablet, TAKE 1 TABLET BY MOUTH 2 TIMES DAILY.*PLEASE MAKE APPOINTMENT FOR REFILLS*, Disp: 180 tablet, Rfl: 3 .  gabapentin (NEURONTIN) 300 MG capsule, Take 300 mg by mouth 3 (three) times daily. , Disp: , Rfl:  .  insulin aspart (NOVOLOG) 100 UNIT/ML injection, Inject 2-12 Units into the skin 3 (three) times daily before meals. Give no units for BS 0-69 - hypoglycemic protocol & notify MD;  70-140 (0  units),141-180 (2 units), 181-220(4 units), 221-260 (6 units), 261-300(8 units), 301-340(10 units), >340 (12 units) notify MD, Disp: , Rfl:  .  insulin glargine (LANTUS) 100 UNIT/ML injection, Inject 0.35 mLs (35 Units total) into the skin every morning., Disp: , Rfl:  .  isosorbide mononitrate (IMDUR) 30 MG 24 hr tablet, Take 30 mg by mouth at bedtime. , Disp: , Rfl:  .  levothyroxine (SYNTHROID, LEVOTHROID) 175 MCG tablet, Take 175 mcg by mouth daily before breakfast., Disp:  , Rfl:  .  Multiple Vitamin (MULTIVITAMIN) tablet, Take 1 tablet by mouth daily., Disp: , Rfl:  .  Tamsulosin HCl (FLOMAX) 0.4 MG CAPS, Take 0.4 mg by mouth every morning. , Disp: , Rfl:  .  VITAMIN E PO, Take 1 capsule by mouth daily. 400 mg capsule for supplement, Disp: , Rfl:    ROS:                                                                                                                                       History obtained from the patient  General ROS: negative for - chills, fatigue, fever, night sweats, weight gain or weight loss Psychological ROS: negative for - behavioral disorder, hallucinations, memory difficulties, mood swings or suicidal ideation Ophthalmic ROS: negative for - blurry vision, double vision, eye pain or loss of vision ENT ROS: negative for - epistaxis, nasal discharge, oral lesions, sore throat, tinnitus or vertigo Allergy and Immunology ROS: negative for - hives or itchy/watery eyes Hematological and Lymphatic ROS: negative for - bleeding problems, bruising or swollen lymph nodes Endocrine ROS: negative for - galactorrhea, hair pattern changes, polydipsia/polyuria or temperature intolerance Respiratory ROS: negative for - cough, hemoptysis, shortness of breath or wheezing Cardiovascular ROS: negative for - chest pain, dyspnea on exertion, edema or irregular heartbeat Gastrointestinal ROS: negative for - abdominal pain, diarrhea, hematemesis, nausea/vomiting or stool incontinence Genito-Urinary ROS: negative for - dysuria, hematuria, incontinence or urinary frequency/urgency Musculoskeletal ROS: negative for - joint swelling or muscular weakness Neurological ROS: as noted in HPI Dermatological ROS: negative for rash and skin lesion changes  Neurologic Examination:                                                                                                    Today's Vitals   04/10/15 1022 04/10/15 1024 04/10/15 1026 04/10/15 1030  BP:  149/48 149/48  118/74  Pulse:  69 71 70  Temp:   97.2 F (36.2 C)   TempSrc:   Oral  Resp:  20 21 21   SpO2: 96% 96% 97% 97%  PainSc: 0-No pain       Evaluation of higher integrative functions including: Level of alertness: Alert,  Oriented to place and person, not to the month but knows the year Speech: Mild baseline altered speech likely from chronic stroke, no clear evidence of significant dysarthria, no aphasia noted.  Test the following cranial nerves: 2-12 grossly intact Motor examination: Mild weakness in the right upper and lower extremity again baseline, full 5/5 motor strength in left upper and lower extremities Examination of sensation : Reduced sensation on the right, reportedly baseline Test coordination: Right upper extremity appendicular ataxia noted, patient reports that its baseline, no ataxia of the left upper extremity    Lab Results: Basic Metabolic Panel:  Recent Labs Lab 04/04/15 1915 04/05/15 0419 04/07/15 0341 04/10/15 1007 04/10/15 1013  NA 134* 136 136 139 138  K 5.4* 4.6 4.6 4.2 4.2  CL 96* 101 102 99* 96*  CO2 28 25 27 28   --   GLUCOSE 219* 250* 243* 315* 298*  BUN 54* 53* 55* 40* 40*  CREATININE 1.76* 1.72* 1.82* 1.77* 1.70*  CALCIUM 9.1 8.6* 8.1* 9.1  --     Liver Function Tests:  Recent Labs Lab 04/10/15 1007  AST 15  ALT 10*  ALKPHOS 55  BILITOT 0.2*  PROT 6.5  ALBUMIN 2.6*   No results for input(s): LIPASE, AMYLASE in the last 168 hours. No results for input(s): AMMONIA in the last 168 hours.  CBC:  Recent Labs Lab 04/04/15 1915 04/05/15 0419 04/07/15 0341 04/08/15 0345 04/10/15 1007 04/10/15 1013  WBC 10.2 8.8 10.7* 9.2 8.8  --   NEUTROABS 6.8  --   --   --  5.3  --   HGB 11.8* 10.6* 9.2* 9.1* 10.1* 11.6*  HCT 36.1 33.4* 28.4* 28.4* 32.8* 34.0*  MCV 82.6 85.9 81.6 81.1 85.6  --   PLT 306 271 296 308 348  --     Cardiac Enzymes: No results for input(s): CKTOTAL, CKMB, CKMBINDEX, TROPONINI in the last 168 hours.  Lipid  Panel: No results for input(s): CHOL, TRIG, HDL, CHOLHDL, VLDL, LDLCALC in the last 168 hours.  CBG:  Recent Labs Lab 04/07/15 1148 04/07/15 1735 04/07/15 2144 04/08/15 0748 04/08/15 1134  GLUCAP 291* 286* 293* 43 183*    Microbiology: Results for orders placed or performed during the hospital encounter of 04/04/15  Blood Cultures x 2 sites     Status: None   Collection Time: 04/04/15 10:10 PM  Result Value Ref Range Status   Specimen Description BLOOD BLOOD RIGHT FOREARM  Final   Special Requests BOTTLES DRAWN AEROBIC AND ANAEROBIC 5ML EA  Final   Culture   Final    NO GROWTH 5 DAYS Performed at [facility]    Report Status 04/09/2015 FINAL  Final  Blood Cultures x 2 sites     Status: None   Collection Time: 04/04/15 10:18 PM  Result Value Ref Range Status   Specimen Description BLOOD BLOOD LEFT FOREARM  Final   Special Requests BOTTLES DRAWN AEROBIC AND ANAEROBIC 5ML EA  Final   Culture   Final    NO GROWTH 5 DAYS Performed at [facility]    Report Status 04/09/2015 FINAL  Final  Surgical pcr screen     Status: None   Collection Time: 04/05/15 12:15 PM  Result Value Ref Range Status   MRSA, PCR NEGATIVE NEGATIVE Final   Staphylococcus  aureus NEGATIVE NEGATIVE Final    Comment:        The Xpert SA Assay (FDA approved for NASAL specimens in patients over 34 years of age), is one component of a comprehensive surveillance program.  Test performance has been validated by [facility] for patients greater than or equal to 27 year old. It is not intended to diagnose infection nor to guide or monitor treatment.      Imaging: Ct Head Wo Contrast  04/10/2015  CLINICAL DATA:  Slurred speech EXAM: CT HEAD WITHOUT CONTRAST TECHNIQUE: Contiguous axial images were obtained from the base of the skull through the vertex without intravenous contrast. COMPARISON:  08/27/2013 FINDINGS: The bony calvarium is intact. No gross soft tissue abnormality is noted.  Diffuse atrophic changes are noted. Areas of chronic white matter ischemic change are seen. No findings to suggest acute hemorrhage, acute infarction or space-occupying mass lesion are noted. IMPRESSION: Chronic atrophic and ischemic changes.  No acute abnormality noted. These results were called by telephone at the time of interpretation on 04/10/2015 at 10:38 am to Dr. Silverio Decamp, who verbally acknowledged these results. Electronically Signed   By: Inez Catalina M.D.   On: 04/10/2015 10:38   Dg Chest Port 1 View  04/10/2015  CLINICAL DATA:  Chronic nonproductive cough EXAM: PORTABLE CHEST 1 VIEW COMPARISON:  October 24, 2014 FINDINGS: There is no edema or consolidation. The heart size and pulmonary vascularity are normal. No adenopathy. No bone lesions. IMPRESSION: No edema or consolidation. Electronically Signed   By: Lowella Grip III M.D.   On: 04/10/2015 10:52      Assessment and plan:   [name] is an 83 y.o. female patient who presented with Reported symptoms of slurred speech and right leg numbness per nursing facility, called stroke code by EMS . She has a chronic stroke with baseline right-sided symptoms as described above, with no definite evidence of any new neurological symptoms to suggest acute stroke . Moreover she had a right foot metatarsal application surgery done 3 days ago. Hence based on lack of definite evidence of acute stroke on clinical exam, with a recent foot surgery 3 days ago, she is not considered a candidate for IV TPA at this time.   she'll be admitted for further neurological monitoring and neurodiagnostic workup with brain MRI, MRA of the head. Continue aspirin 325 mg daily We'll follow-up.

## 2015-04-11 ENCOUNTER — Observation Stay (HOSPITAL_BASED_OUTPATIENT_CLINIC_OR_DEPARTMENT_OTHER): Payer: Medicare Other

## 2015-04-11 DIAGNOSIS — G459 Transient cerebral ischemic attack, unspecified: Secondary | ICD-10-CM | POA: Diagnosis not present

## 2015-04-11 DIAGNOSIS — E039 Hypothyroidism, unspecified: Secondary | ICD-10-CM | POA: Diagnosis not present

## 2015-04-11 DIAGNOSIS — I639 Cerebral infarction, unspecified: Secondary | ICD-10-CM | POA: Diagnosis not present

## 2015-04-11 DIAGNOSIS — I10 Essential (primary) hypertension: Secondary | ICD-10-CM | POA: Diagnosis not present

## 2015-04-11 DIAGNOSIS — G451 Carotid artery syndrome (hemispheric): Secondary | ICD-10-CM

## 2015-04-11 DIAGNOSIS — R55 Syncope and collapse: Secondary | ICD-10-CM | POA: Diagnosis not present

## 2015-04-11 DIAGNOSIS — Z8673 Personal history of transient ischemic attack (TIA), and cerebral infarction without residual deficits: Secondary | ICD-10-CM

## 2015-04-11 LAB — URINE CULTURE

## 2015-04-11 LAB — GLUCOSE, CAPILLARY
GLUCOSE-CAPILLARY: 127 mg/dL — AB (ref 65–99)
GLUCOSE-CAPILLARY: 167 mg/dL — AB (ref 65–99)
GLUCOSE-CAPILLARY: 171 mg/dL — AB (ref 65–99)
GLUCOSE-CAPILLARY: 182 mg/dL — AB (ref 65–99)
GLUCOSE-CAPILLARY: 91 mg/dL (ref 65–99)
Glucose-Capillary: 55 mg/dL — ABNORMAL LOW (ref 65–99)
Glucose-Capillary: 62 mg/dL — ABNORMAL LOW (ref 65–99)
Glucose-Capillary: 176 mg/dL — ABNORMAL HIGH (ref 65–99)

## 2015-04-11 LAB — LIPID PANEL
CHOL/HDL RATIO: 3.5 ratio
Cholesterol: 128 mg/dL (ref 0–200)
HDL: 37 mg/dL — AB (ref >40)
LDL CALC: 66 mg/dL (ref 0–99)
Triglycerides: 123 mg/dL (ref <150)
VLDL: 25 mg/dL (ref 0–40)

## 2015-04-11 LAB — HEMOGLOBIN A1C
Hgb A1c MFr Bld: 11.2 % — ABNORMAL HIGH (ref 4.8–5.6)
MEAN PLASMA GLUCOSE: 275 mg/dL

## 2015-04-11 LAB — ECHOCARDIOGRAM COMPLETE
Height: 66 in
WEIGHTICAEL: 2547.2 [oz_av]

## 2015-04-11 MED ORDER — INSULIN ASPART 100 UNIT/ML ~~LOC~~ SOLN: 0.0000 [IU] | Freq: Every day | SUBCUTANEOUS | Status: DC

## 2015-04-11 MED ORDER — INSULIN ASPART 100 UNIT/ML ~~LOC~~ SOLN
0.0000 [IU] | Freq: Three times a day (TID) | SUBCUTANEOUS | Status: DC
  Administered 2015-04-11: 3 [IU] via SUBCUTANEOUS
  Administered 2015-04-11: 2 [IU] via SUBCUTANEOUS
  Administered 2015-04-11: 3 [IU] via SUBCUTANEOUS
  Administered 2015-04-12 (×2): 5 [IU] via SUBCUTANEOUS

## 2015-04-11 NOTE — Progress Notes (Signed)
[facility] PROGRESS NOTE  [name] [id number] DOB: 1919-07-06 DOA: 04/10/2015 PCP: Jani Gravel, MD  Assessment/Plan: Principal Problem:   CVA (cerebral infarction) - Neurology consulted and patient currently undergoing work up for further evaluation recommendations by neurologist.  Active Problems:   HYPERTENSION, BENIGN ESSENTIAL - Stable continue current medication regimen    Hypothyroidism - Stable continue Synthroid, last TSH within normal limits    Chronic kidney disease (CKD), stage IV (severe) (HCC) - Creatinine stable at 1.7    Coronary atherosclerosis of native coronary artery - Currently on aspirin    Diabetes mellitus with renal manifestations, uncontrolled ([place])   Chronic diastolic CHF (congestive heart failure) ([place]) - Continue beta blocker and Imdur    History of CVA w/ right side deficits   S/P transmetatarsal amputation of right foot ([place]) -Patient to continue routine follow-up with surgeon after discharge   Code Status: Full Family Communication: Discussed directly with patient  Disposition Plan: Pending improvement in condition   Consultants:  Neurology  Procedures:  None  Antibiotics:  None  HPI/Subjective: Pt has no new complaints. No acute issues overnight.  Objective: Filed Vitals:   04/11/15 0640 04/11/15 1500  BP: 109/27 159/47  Pulse: 59 62  Temp: 98.1 F (36.7 C) 98.1 F (36.7 C)  Resp: 16 16    Intake/Output Summary (Last 24 hours) at 04/11/15 1543 Last data filed at 04/11/15 1030  Gross per 24 hour  Intake    240 ml  Output    200 ml  Net     40 ml   Filed Weights   04/10/15 1900  Weight: 72.213 kg (159 lb 3.2 oz)    Exam:   General:  Pt in nad, alert and awake  Cardiovascular: rrr, no rubs  Respiratory: no increased wob, no wheezes  Abdomen: soft, nd  Musculoskeletal: no cyanosis or clubbing   Data Reviewed: Basic Metabolic Panel:  Recent Labs Lab 04/04/15 1915 04/05/15 0419  04/07/15 04/07/15 0341 04/10/15 1007 04/10/15 1013  NA 134* 136 136* 136 139 138  K 5.4* 4.6 4.6  4.6 4.6 4.2 4.2  CL 96* 101  --  102 99* 96*  CO2 28 25  --  27 28  --   GLUCOSE 219* 250*  --  243* 315* 298*  BUN 54* 53* 55* 55* 40* 40*  CREATININE 1.76* 1.72* 1.8* 1.82* 1.77* 1.70*  CALCIUM 9.1 8.6*  --  8.1* 9.1  --    Liver Function Tests:  Recent Labs Lab 04/10/15 1007  AST 15  ALT 10*  ALKPHOS 55  BILITOT 0.2*  PROT 6.5  ALBUMIN 2.6*   No results for input(s): LIPASE, AMYLASE in the last 168 hours. No results for input(s): AMMONIA in the last 168 hours. CBC:  Recent Labs Lab 04/04/15 1915 04/05/15 0419 04/07/15 0341 04/08/15 04/08/15 0345 04/10/15 1007 04/10/15 1013  WBC 10.2 8.8 10.7* 9.2 9.2 8.8  --   NEUTROABS 6.8  --   --   --   --  5.3  --   HGB 11.8* 10.6* 9.2* 9.1* 9.1* 10.1* 11.6*  HCT 36.1 33.4* 28.4* 28* 28.4* 32.8* 34.0*  MCV 82.6 85.9 81.6  --  81.1 85.6  --   PLT 306 271 296 308 308 348  --    Cardiac Enzymes: No results for input(s): CKTOTAL, CKMB, CKMBINDEX, TROPONINI in the last 168 hours. BNP (last 3 results) No results for input(s): BNP in the last 8760 hours.  ProBNP (last 3 results) No  results for input(s): PROBNP in the last 8760 hours.  CBG:  Recent Labs Lab 04/11/15 0348 04/11/15 0420 04/11/15 0437 04/11/15 0616 04/11/15 1124  GLUCAP 55* 62* 91 167* 176*    Recent Results (from the past 240 hour(s))  Blood Cultures x 2 sites     Status: None   Collection Time: 04/04/15 10:10 PM  Result Value Ref Range Status   Specimen Description BLOOD BLOOD RIGHT FOREARM  Final   Special Requests BOTTLES DRAWN AEROBIC AND ANAEROBIC 5ML EA  Final   Culture   Final    NO GROWTH 5 DAYS Performed at [facility]    Report Status 04/09/2015 FINAL  Final  Blood Cultures x 2 sites     Status: None   Collection Time: 04/04/15 10:18 PM  Result Value Ref Range Status   Specimen Description BLOOD BLOOD LEFT FOREARM  Final    Special Requests BOTTLES DRAWN AEROBIC AND ANAEROBIC 5ML EA  Final   Culture   Final    NO GROWTH 5 DAYS Performed at [facility]    Report Status 04/09/2015 FINAL  Final  Surgical pcr screen     Status: None   Collection Time: 04/05/15 12:15 PM  Result Value Ref Range Status   MRSA, PCR NEGATIVE NEGATIVE Final   Staphylococcus aureus NEGATIVE NEGATIVE Final    Comment:        The Xpert SA Assay (FDA approved for NASAL specimens in patients over 66 years of age), is one component of a comprehensive surveillance program.  Test performance has been validated by [facility] for patients greater than or equal to 72 year old. It is not intended to diagnose infection nor to guide or monitor treatment.   Urine culture     Status: None   Collection Time: 04/10/15 12:43 PM  Result Value Ref Range Status   Specimen Description URINE, RANDOM  Final   Special Requests NONE  Final   Culture MULTIPLE SPECIES PRESENT, SUGGEST RECOLLECTION  Final   Report Status 04/11/2015 FINAL  Final     Studies: Ct Head Wo Contrast  04/10/2015  CLINICAL DATA:  Slurred speech EXAM: CT HEAD WITHOUT CONTRAST TECHNIQUE: Contiguous axial images were obtained from the base of the skull through the vertex without intravenous contrast. COMPARISON:  08/27/2013 FINDINGS: The bony calvarium is intact. No gross soft tissue abnormality is noted. Diffuse atrophic changes are noted. Areas of chronic white matter ischemic change are seen. No findings to suggest acute hemorrhage, acute infarction or space-occupying mass lesion are noted. IMPRESSION: Chronic atrophic and ischemic changes.  No acute abnormality noted. These results were called by telephone at the time of interpretation on 04/10/2015 at 10:38 am to Dr. Silverio Decamp, who verbally acknowledged these results. Electronically Signed   By: Inez Catalina M.D.   On: 04/10/2015 10:38   Mr Virgel Paling Wo Contrast  04/10/2015  CLINICAL DATA:  82 year old diabetic  hypertensive female with prior stroke presenting with near syncopal episode and slurred speech. Subsequent encounter. EXAM: MRI HEAD WITHOUT CONTRAST MRA HEAD WITHOUT CONTRAST TECHNIQUE: Multiplanar, multiecho pulse sequences of the brain and surrounding structures were obtained without intravenous contrast. Angiographic images of the head were obtained using MRA technique without contrast. COMPARISON:  04/10/2015 CT.  02/04/2012 MR and MR angiogram. FINDINGS: MRI HEAD FINDINGS No acute infarct or intracranial hemorrhage. Moderate global atrophy without hydrocephalus. Mild to moderate chronic small vessel disease changes. No intracranial mass lesion noted on this unenhanced exam. Partial opacification left mastoid  air cells without obstructing lesion of eustachian tube. Post lens replacement otherwise orbital structures unremarkable. Cervical spondylotic changes with spinal stenosis and cord flattening most notable C2-3 followed by the C3-4 and C4-5 level. MRA HEAD FINDINGS Anterior circulation without medium or large size vessel significant stenosis or occlusion. Moderate moderate narrowing of portions of the middle cerebral artery branches and A2 segment anterior cerebral artery bilaterally. No high-grade stenosis distal vertebral arteries or basilar artery. Moderate posterior circulation branch vessel narrowing and irregularity including high-grade stenosis proximal and distal aspect of the posterior cerebral arteries more notable on the right. No aneurysm noted. IMPRESSION: MRI HEAD No acute infarct or intracranial hemorrhage. Moderate global atrophy without hydrocephalus. Mild to moderate chronic small vessel disease changes. No intracranial mass lesion noted on this unenhanced exam. Partial opacification left mastoid air cells. Cervical spondylotic changes with spinal stenosis and cord flattening most notable C2-3 followed by the C3-4 and C4-5 level. MRA HEAD Anterior circulation without medium or large size  vessel significant stenosis or occlusion. Moderate moderate narrowing of portions of the middle cerebral artery branches and A2 segment anterior cerebral artery bilaterally. No high-grade stenosis distal vertebral arteries or basilar artery. Moderate posterior circulation branch vessel narrowing and irregularity. High-grade stenosis proximal and distal aspect of the posterior cerebral arteries more notable on the right. Electronically Signed   By: Genia Del M.D.   On: 04/10/2015 14:52   Mr Brain Wo Contrast  04/10/2015  CLINICAL DATA:  82 year old diabetic hypertensive female with prior stroke presenting with near syncopal episode and slurred speech. Subsequent encounter. EXAM: MRI HEAD WITHOUT CONTRAST MRA HEAD WITHOUT CONTRAST TECHNIQUE: Multiplanar, multiecho pulse sequences of the brain and surrounding structures were obtained without intravenous contrast. Angiographic images of the head were obtained using MRA technique without contrast. COMPARISON:  04/10/2015 CT.  02/04/2012 MR and MR angiogram. FINDINGS: MRI HEAD FINDINGS No acute infarct or intracranial hemorrhage. Moderate global atrophy without hydrocephalus. Mild to moderate chronic small vessel disease changes. No intracranial mass lesion noted on this unenhanced exam. Partial opacification left mastoid air cells without obstructing lesion of eustachian tube. Post lens replacement otherwise orbital structures unremarkable. Cervical spondylotic changes with spinal stenosis and cord flattening most notable C2-3 followed by the C3-4 and C4-5 level. MRA HEAD FINDINGS Anterior circulation without medium or large size vessel significant stenosis or occlusion. Moderate moderate narrowing of portions of the middle cerebral artery branches and A2 segment anterior cerebral artery bilaterally. No high-grade stenosis distal vertebral arteries or basilar artery. Moderate posterior circulation branch vessel narrowing and irregularity including high-grade  stenosis proximal and distal aspect of the posterior cerebral arteries more notable on the right. No aneurysm noted. IMPRESSION: MRI HEAD No acute infarct or intracranial hemorrhage. Moderate global atrophy without hydrocephalus. Mild to moderate chronic small vessel disease changes. No intracranial mass lesion noted on this unenhanced exam. Partial opacification left mastoid air cells. Cervical spondylotic changes with spinal stenosis and cord flattening most notable C2-3 followed by the C3-4 and C4-5 level. MRA HEAD Anterior circulation without medium or large size vessel significant stenosis or occlusion. Moderate moderate narrowing of portions of the middle cerebral artery branches and A2 segment anterior cerebral artery bilaterally. No high-grade stenosis distal vertebral arteries or basilar artery. Moderate posterior circulation branch vessel narrowing and irregularity. High-grade stenosis proximal and distal aspect of the posterior cerebral arteries more notable on the right. Electronically Signed   By: Genia Del M.D.   On: 04/10/2015 14:52   Dg [facility]  04/10/2015  CLINICAL DATA:  Chronic nonproductive cough EXAM: PORTABLE CHEST 1 VIEW COMPARISON:  October 24, 2014 FINDINGS: There is no edema or consolidation. The heart size and pulmonary vascularity are normal. No adenopathy. No bone lesions. IMPRESSION: No edema or consolidation. Electronically Signed   By: Lowella Grip III M.D.   On: 04/10/2015 10:52    Scheduled Meds: . aspirin EC  325 mg Oral Daily  . carvedilol  6.25 mg Oral BID WC  . diltiazem  180 mg Oral QHS  . enoxaparin (LOVENOX) injection  30 mg Subcutaneous Q24H  . insulin aspart  0-15 Units Subcutaneous TID WC  . insulin aspart  0-5 Units Subcutaneous QHS  . insulin glargine  35 Units Subcutaneous q morning - 10a  . isosorbide mononitrate  30 mg Oral QHS  . levothyroxine  175 mcg Oral QAC breakfast  . multivitamin with minerals  1 tablet Oral Daily  .  tamsulosin  0.4 mg Oral q morning - 10a   Continuous Infusions:    Time spent: > 35 minutes   Velvet Bathe  [facility] Pager [phone] If 7PM-7AM, please contact night-coverage at [email or web address], password [facility] 04/11/2015, 3:43 PM

## 2015-04-11 NOTE — Progress Notes (Signed)
  Echocardiogram 2D Echocardiogram has been performed.  Jennette Dubin 04/11/2015, 9:51 AM

## 2015-04-11 NOTE — Care Management Obs Status (Signed)
[place] NOTIFICATION   Patient Details  Name: [name] MRN: [id number] Date of Birth: 10-May-1919   Medicare Observation Status Notification Given:  Yes (mri negative)    Pollie Friar, RN 04/11/2015, 3:38 PM

## 2015-04-11 NOTE — Evaluation (Signed)
Occupational Therapy Evaluation Patient Details Name: [name] MRN: [id number] DOB: 08-30-1919 Today's Date: 04/11/2015    History of Present Illness [name] 82 y.o. female with h/o CVA with apparent R side deficits and residual expressive aphasia who was recently discharged (3/14) after admission for cellulitis of the R foot, with subsequent R foot amputation (3/12), diabetes on insulin, atrial flutter, CKD stage IV, hypothyroidism, HTN and anemia of chronic disease. Pt sent to ED after emesis, L eye and L facial drooping as well as significant dysarthria much worse than baseline. CXR 3/16 no edema or consolidation. MR Brain 3/16 no acute infarct or intracranial hemorrhage.   Clinical Impression   Pt reports she has been getting assist with ADLs d/c to [facility]. Per chart, pt has been performing stand pivot transfers with +2 assist PTA. Pt with incontinent bowel and bladder episode during session. Pt able to perform rolling at bed level with supervision and max assist provided for peri care. Pt planning to return to [facility] upon d/c; agree with SNF placement for further rehab. Pt would benefit from continued skilled OT to address established goals.    Follow Up Recommendations  SNF;Supervision/Assistance - 24 hour    Equipment Recommendations  Other (comment) (TBD at next venue)    Recommendations for Other Services       Precautions / Restrictions Precautions Precautions: Fall Precaution Comments: No WB orders or required braces. Pt recent s/p R foot amputation. Restrictions Weight Bearing Restrictions:  (no orders but pt with recent R foot amputation)      Mobility Bed Mobility Overal bed mobility: Needs Assistance Bed Mobility: Rolling;Supine to Sit;Sit to Supine Rolling: Supervision   Supine to sit: Supervision Sit to supine: Min assist (for LEs into bed; pain in R LE)   General bed mobility comments: Use of bed rails with HOB flat.  Transfers                 General transfer comment: Not assessed at this time.    Balance Overall balance assessment: Needs assistance Sitting-balance support: Feet unsupported;No upper extremity supported Sitting balance-Leahy Scale: Good                                      ADL Overall ADL's : Needs assistance/impaired Eating/Feeding: Set up;Sitting   Grooming: Set up;Sitting   Upper Body Bathing: Minimal assitance;Sitting   Lower Body Bathing: Moderate assistance;Sitting/lateral leans   Upper Body Dressing : Minimal assistance;Sitting Upper Body Dressing Details (indicate cue type and reason): Pt able to doff/don hospital gown sitting EOB with min assist. Lower Body Dressing: Moderate assistance;Sitting/lateral leans       Toileting- Clothing Manipulation and Hygiene: Maximal assistance;Bed level (for peri care)         General ADL Comments: Transfers not attempted at this time secondary to no WB status in chart from recent R foot amputation; per chart pt requiring +2 assist with stand pivot transfers to [facility] PTA. Pt with incontinent bowel and bladder episode before/during session; pt able to assist with rolling at bed level but max assist provided for peri care.       Vision     Perception     Praxis      Pertinent Vitals/Pain Pain Assessment: Faces Faces Pain Scale: Hurts whole lot Pain Location: R foot with sit to supine transfer Pain Descriptors / Indicators: Grimacing;Moaning Pain Intervention(s): Monitored during  session;Repositioned     Hand Dominance Right   Extremity/Trunk Assessment Upper Extremity Assessment Upper Extremity Assessment: Overall WFL for tasks assessed   Lower Extremity Assessment Lower Extremity Assessment: Defer to PT evaluation       Communication Communication Communication: No difficulties   Cognition Arousal/Alertness: Awake/alert Behavior During Therapy: WFL for tasks assessed/performed Overall Cognitive Status: History  of cognitive impairments - at baseline       Memory: Decreased recall of precautions             General Comments       Exercises       Shoulder Instructions      Home Living Family/patient expects to be discharged to:: Skilled nursing facility   Available Help at Discharge: [place] Type of Home: [place]                              Lives With:  (at SNF)    Prior Functioning/Environment Level of Independence: Needs assistance  Gait / Transfers Assistance Needed: Per chart pt able to perform stand pivot transfers with +2 assist ADL's / Homemaking Assistance Needed: Pt reports she has been needed assist with all ADLs.        OT Diagnosis: Generalized weakness;Cognitive deficits;Acute pain   OT Problem List: Decreased strength;Decreased activity tolerance;Decreased range of motion;Impaired balance (sitting and/or standing);Decreased cognition;Decreased knowledge of use of DME or AE;Decreased knowledge of precautions;Decreased safety awareness;Pain   OT Treatment/Interventions: Self-care/ADL training;Energy conservation;DME and/or AE instruction;Therapeutic activities;Patient/family education;Balance training    OT Goals(Current goals can be found in the care plan section) Acute Rehab OT Goals Patient Stated Goal: go back to [place] OT Goal Formulation: With patient Time For Goal Achievement: 04/25/15 Potential to Achieve Goals: Good ADL Goals Pt Will Perform Grooming: with set-up;sitting Pt Will Perform Upper Body Bathing: with set-up;sitting Pt Will Perform Lower Body Bathing: with min assist;sit to/from stand Pt Will Transfer to Toilet: with min assist;stand pivot transfer;bedside commode Pt Will Perform Toileting - Clothing Manipulation and hygiene: with min assist;sit to/from stand  OT Frequency: Min 2X/week   Barriers to D/C:            Co-evaluation PT/OT/SLP Co-Evaluation/Treatment: Yes Reason for  Co-Treatment: For patient/therapist safety   OT goals addressed during session: ADL's and self-care;Other (comment) (mobility)      End of Session Nurse Communication: Other (comment) (RN tech-pt with incontinent episode)  Activity Tolerance: Patient tolerated treatment well Patient left: in bed;with call bell/phone within reach;with bed alarm set   Time: [id number] OT Time Calculation (min): 12 min Charges:  OT General Charges $OT Visit: 1 Procedure OT Evaluation $OT Eval Moderate Complexity: 1 Procedure G-Codes: OT G-codes **NOT FOR INPATIENT CLASS** Functional Assessment Tool Used: Clinical judgement Functional Limitation: Self care Self Care Current Status [id number]): At least 40 percent but less than 60 percent impaired, limited or restricted Self Care Goal Status [id number]): At least 20 percent but less than 40 percent impaired, limited or restricted   Binnie Kand M.S., OTR/L Pager: [phone]  04/11/2015, 4:00 PM

## 2015-04-11 NOTE — Progress Notes (Signed)
VASCULAR LAB PRELIMINARY  PRELIMINARY  PRELIMINARY  PRELIMINARY  Carotid duplex completed.     Bilateral:  1-39% ICA stenosis.  Vertebral artery flow is antegrade.     Janifer Adie, RVT, RDMS 04/11/2015, 2:45 PM

## 2015-04-11 NOTE — Progress Notes (Signed)
Hypoglycemic Event  CBG: 55  Treatment: 15 GM carbohydrate snack  Symptoms: Drowsiness, slow to respond.   Follow-up CBG: Time: [id number] CBG Result:62  Follow-up CBG Time: 0430  CBG Result: 91  Possible Reasons for Event: Inadequate meal intake , q4 coverage Comments/MD notified: Kathline Magic PA paged at [place].     Moises Terpstra, Rande Brunt

## 2015-04-11 NOTE — Evaluation (Signed)
Clinical/Bedside Swallow Evaluation Patient Details  Name: [name] MRN: [id number] Date of Birth: 1919/02/15  Today's Date: 04/11/2015 Time: SLP Start Time (ACUTE ONLY): 1433 SLP Stop Time (ACUTE ONLY): 1456 SLP Time Calculation (min) (ACUTE ONLY): 23 min  Past Medical History:  Past Medical History  Diagnosis Date  . Myocardial infarction ([place])   . Coronary artery disease   . Hypertension   . Dysrhythmia   . Peripheral vascular disease ([place])   . Hypothyroidism   . Diabetes mellitus   . Presbyesophagus 04/23/2011  . Urinary retention with incomplete bladder emptying 04/23/2011  . UTI (lower urinary tract infection) 04/21/2011  . Diastolic CHF, chronic (HCC)     Grade I diastolic dysfunction on Echo 04/26/11  . Anaphylaxis 04/25/2011    Thought to be from Rocephin  . History of atrial flutter     ablation  . C. difficile colitis 04/17/2012  . Atrial flutter ([place]) 11/01/2012  . HCAP (healthcare-associated pneumonia) 10/21/2014  . Stroke [facility]) 2003    "lost the use of all of my right side"  . HOH (hard of hearing)   . Cellulitis   . CKD (chronic kidney disease), stage IV ([place])   . Expressive aphasia   . Hyperkalemia   . Hyponatremia    Past Surgical History:  Past Surgical History  Procedure Laterality Date  . Cholecystectomy    . Colonoscopy  01/15/2011    Procedure: COLONOSCOPY;  Surgeon: Beryle Beams, MD;  [facility];  Service: Endoscopy;  Laterality: N/A;  . Amputation Right 04/14/2012    Procedure: AMPUTATION RAY;  Surgeon: Newt Minion, MD;  Location: [facility];  Service: Orthopedics;  Laterality: Right;  ankle block  . Cardiac electrophysiology mapping and ablation    . Nm myocar perf wall motion  01/23/2009    mild ischemia mid inferior & apical inferior.  EF 75%  . Amputation Right 08/10/2014    Procedure: AMPUTATION RAY;  Surgeon: Mcarthur Rossetti, MD;  [facility];  Service: Orthopedics;  Laterality: Right;  ankle block  . Appendectomy     . Shoulder surgery Right     "put back in place'  . Eye surgery Bilateral     Cararact  . I&d extremity Right 12/03/2014    Procedure: IRRIGATION AND DEBRIDEMENT RIGHT FOOT;  Surgeon: Mcarthur Rossetti, MD;  Location: [place];  Service: Orthopedics;  Laterality: Right;  . Amputation Right 04/06/2015    Procedure: Transmetatarsal AMPUTATION RIGHT FOOT;  Surgeon: Mcarthur Rossetti, MD;  [facility];  Service: Orthopedics;  Laterality: Right;   HPI:  [name] 82 y.o. female with h/o CVA with apparent R side deficits and residual expressive aphasia who was recently discharged (3/14) after admission for cellulitis of the R foot, with subsequent R foot amputation (3/12), diabetes on insulin, atrial flutter, CKD stage IV, hypothyroidism, HTN and anemia of chronic disease. Pt sent to ED after emesis, L eye and L facial drooping as well as significant dysarthria much worse than baseline. CXR 3/16 no edema or consolidation. MR Brain 3/16 no acute infarct or intracranial hemorrhage. BSE 09/2014 recommended regular diet with thin liquids.   Assessment / Plan / Recommendation Clinical Impression  No evidence of dysphagia and no s/s of penetration/aspiration observed. Mastication WFL. Pt educated re: diet recommendation of regular textures and thin liquids. Aspiration appears low at present. No dysphagia f/u necessary at this time.    Aspiration Risk  Mild aspiration risk    Diet Recommendation  Regular;Thin liquid   Liquid Administration via: Cup;Straw Medication Administration: Whole meds with liquid Supervision: Patient able to self feed Compensations: Slow rate;Small sips/bites;Minimize environmental distractions Postural Changes: Seated upright at 90 degrees    Other  Recommendations Oral Care Recommendations: Oral care BID   Follow up Recommendations  Skilled Nursing facility    Frequency and Duration            Prognosis Prognosis for Safe Diet Advancement: Good      Swallow  Study   General HPI: [name] 82 y.o. female with h/o CVA with apparent R side deficits and residual expressive aphasia who was recently discharged (3/14) after admission for cellulitis of the R foot, with subsequent R foot amputation (3/12), diabetes on insulin, atrial flutter, CKD stage IV, hypothyroidism, HTN and anemia of chronic disease. Pt sent to ED after emesis, L eye and L facial drooping as well as significant dysarthria much worse than baseline. CXR 3/16 no edema or consolidation. MR Brain 3/16 no acute infarct or intracranial hemorrhage. BSE 09/2014 recommended regular diet with thin liquids. Type of Study: Bedside Swallow Evaluation Previous Swallow Assessment: see HPI Diet Prior to this Study: Regular;Thin liquids Temperature Spikes Noted: No Respiratory Status: Room air History of Recent Intubation: No Behavior/Cognition: Pleasant mood;Cooperative;Alert Oral Cavity Assessment: Within Functional Limits Oral Care Completed by SLP: No Oral Cavity - Dentition: Dentures, top;Dentures, bottom Vision: Functional for self-feeding Self-Feeding Abilities: Able to feed self Patient Positioning: Upright in bed Baseline Vocal Quality:  (dysarthric) Volitional Cough: Strong Volitional Swallow: Able to elicit    Oral/Motor/Sensory Function Overall Oral Motor/Sensory Function: Within functional limits   Ice Chips Ice chips: Not tested   Thin Liquid Thin Liquid: Within functional limits    Nectar Thick Nectar Thick Liquid: Not tested   Honey Thick Honey Thick Liquid: Not tested   Puree Puree: Not tested   Solid   GO   Solid: Within functional limits    Functional Assessment Tool Used:  (skilled clinical judgement) Functional Limitations: Swallowing Swallow Current Status [id number]): At least 1 percent but less than 20 percent impaired, limited or restricted Swallow Goal Status [phone]): At least 1 percent but less than 20 percent impaired, limited or restricted Swallow Discharge Status  [phone]): At least 1 percent but less than 20 percent impaired, limited or restricted   Titus Mould 04/11/2015,3:32 PM    Titus Mould, Student-SLP

## 2015-04-11 NOTE — Evaluation (Signed)
Physical Therapy Evaluation Patient Details Name: [name] MRN: [id number] DOB: Dec 28, 1919 Today's Date: 04/11/2015   History of Present Illness  [name] 82 y.o. female with h/o CVA (with apparent R side deficits and residual expressive aphasia) who was recently discharged (3/14) after admission for cellulitis of the R foot, with subsequent R transmetatarsal amputation (3/12), diabetes on insulin, atrial flutter, CKD stage IV, hypothyroidism, HTN and anemia of chronic disease. Pt sent to ED after emesis, L eye and L facial drooping as well as significant dysarthria much worse than baseline. CXR 3/16 no edema or consolidation. MR Brain 3/16 no acute infarct or intracranial hemorrhage.    Clinical Impression  Pt admitted with above diagnosis. Pt currently with functional limitations due to the deficits listed below (see PT Problem List).  Pt will benefit from skilled PT to increase their independence and safety with mobility to allow discharge to the venue listed below.       Follow Up Recommendations SNF;Supervision/Assistance - 24 hour    Equipment Recommendations  None recommended by PT    Recommendations for Other Services       Precautions / Restrictions Precautions Precautions: Fall Precaution Comments: No WB orders or required braces. Pt recent s/p R foot amputation. Required Braces or Orthoses: Other Brace/Splint Other Brace/Splint: Darco shoe Rt foot per recent chart Restrictions Weight Bearing Restrictions: Yes (no orders but recent chart states WBAT thru heel in Darco) RLE Weight Bearing: Weight bearing as tolerated (thru heel in Darco shoe)      Mobility  Bed Mobility Overal bed mobility: Needs Assistance Bed Mobility: Rolling;Supine to Sit;Sit to Supine Rolling: Supervision   Supine to sit: Supervision Sit to supine: Min assist (for LEs into bed; pain in R LE)   General bed mobility comments: Use of bed rails with HOB flat.  Transfers                  General transfer comment: Patient reporting she does not know if she can put weight on her Rt foot. Deferred during eval. Later found in previous medical record she is allowed WBAT through heel with Darco shoe.  Ambulation/Gait                Stairs            Wheelchair Mobility    Modified Rankin (Stroke Patients Only)       Balance Overall balance assessment: Needs assistance Sitting-balance support: No upper extremity supported;Feet unsupported Sitting balance-Leahy Scale: Good                                       Pertinent Vitals/Pain Pain Assessment: Faces Faces Pain Scale: Hurts whole lot Pain Location: Rt foot with return to supine Pain Descriptors / Indicators: Grimacing;Moaning Pain Intervention(s): Limited activity within patient's tolerance;Monitored during session;Repositioned    Home Living Family/patient expects to be discharged to:: Skilled nursing facility   Available Help at Discharge: [place] Type of Home: [place]                Prior Function Level of Independence: Needs assistance   Gait / Transfers Assistance Needed: Per chart pt able to perform stand pivot transfers with +2 assist  ADL's / Homemaking Assistance Needed: Pt reports she has been needed assist with all ADLs.        Hand Dominance   Dominant Hand:  Right    Extremity/Trunk Assessment   Upper Extremity Assessment: Overall WFL for tasks assessed           Lower Extremity Assessment: Overall WFL for tasks assessed (rt slightly weaker than lt) RLE Deficits / Details: dressing on R foot, transmet. aMPUTATION    Cervical / Trunk Assessment: Kyphotic  Communication   Communication: Expressive difficulties  Cognition Arousal/Alertness: Awake/alert Behavior During Therapy: Anxious Overall Cognitive Status: No family/caregiver present to determine baseline cognitive functioning       Memory: Decreased  recall of precautions              General Comments      Exercises        Assessment/Plan    PT Assessment Patient needs continued PT services  PT Diagnosis Difficulty walking;Acute pain   PT Problem List Decreased strength;Decreased range of motion;Decreased activity tolerance;Decreased balance;Decreased mobility;Decreased safety awareness;Decreased knowledge of precautions;Pain;Decreased cognition;Decreased knowledge of use of DME  PT Treatment Interventions DME instruction;Functional mobility training;Therapeutic activities;Therapeutic exercise;Wheelchair mobility training;Balance training;Cognitive remediation;Patient/family education   PT Goals (Current goals can be found in the Care Plan section) Acute Rehab PT Goals Patient Stated Goal: go back to [facility] PT Goal Formulation: With patient Time For Goal Achievement: 04/25/15 Potential to Achieve Goals: Good    Frequency Min 3X/week   Barriers to discharge        Co-evaluation PT/OT/SLP Co-Evaluation/Treatment: Yes Reason for Co-Treatment: For patient/therapist safety PT goals addressed during session: Mobility/safety with mobility;Balance OT goals addressed during session: ADL's and self-care;Other (comment) (mobility)       End of Session   Activity Tolerance: Patient tolerated treatment well (until return to supine) Patient left: in bed;with call bell/phone within reach;with bed alarm set Nurse Communication: Mobility status;Weight bearing status (need Darco shoe)    Functional Assessment Tool Used: clinical judgement Functional Limitation: Mobility: Walking and moving around Mobility: Walking and Moving Around Current Status [id number]): At least 80 percent but less than 100 percent impaired, limited or restricted Mobility: Walking and Moving Around Goal Status [phone]): At least 20 percent but less than 40 percent impaired, limited or restricted    Time: [id number] PT Time Calculation (min) (ACUTE ONLY): 23  min   Charges:   PT Evaluation $PT Eval Low Complexity: 1 Procedure     PT G Codes:   PT G-Codes **NOT FOR INPATIENT CLASS** Functional Assessment Tool Used: clinical judgement Functional Limitation: Mobility: Walking and moving around Mobility: Walking and Moving Around Current Status [id number]): At least 80 percent but less than 100 percent impaired, limited or restricted Mobility: Walking and Moving Around Goal Status [phone]): At least 20 percent but less than 40 percent impaired, limited or restricted    Daschel Roughton 04/11/2015, 4:24 PM Pager [phone]

## 2015-04-11 NOTE — Care Management Note (Signed)
Case Management Note  Patient Details  Name: [name] MRN: [id number] Date of Birth: 1919/07/01  Subjective/Objective:                    Action/Plan: Patient presented with slurred speech. Resides at [facility].  CSW notified. CM will continue to follow for discharge needs pending PT/OT evals and physician orders.  Expected Discharge Date:                  Expected Discharge Plan:     In-House Referral:     Discharge planning Services     Post Acute Care Choice:    Choice offered to:     DME Arranged:    DME Agency:     HH Arranged:    HH Agency:     Status of Service:  In process, will continue to follow  Medicare Important Message Given:    Date Medicare IM Given:    Medicare IM give by:    Date Additional Medicare IM Given:    Additional Medicare Important Message give by:     If discussed at [place] of Stay Meetings, dates discussed:    Additional Comments:  Rolm Baptise, RN 04/11/2015, 11:24 AM [phone]

## 2015-04-11 NOTE — Evaluation (Signed)
Speech Language Pathology Evaluation Patient Details Name: [name] MRN: [id number] DOB: 1919-09-10 Today's Date: 04/11/2015 Time: [id number] SLP Time Calculation (min) (ACUTE ONLY): 23 min  Problem List:  Patient Active Problem List   Diagnosis Date Noted  . CVA (cerebral infarction) 04/10/2015  . S/P transmetatarsal amputation of right foot ([place]) 04/10/2015  . Left ventricular diastolic dysfunction, NYHA class 2 04/10/2015  . History of CVA w/ right side deficits 04/05/2015  . Chronic osteomyelitis ([place]) 04/05/2015  . Diabetes mellitus with renal manifestations, uncontrolled ([place]) 10/21/2014  . Chronic ulcer of left foot ([place]) 10/21/2014  . Chronic diastolic CHF (congestive heart failure) ([place]) 10/21/2014  . Diabetic neuropathy ([place]) 08/09/2014  . Coronary atherosclerosis of native coronary artery 06/07/2012  . Chronic kidney disease (CKD), stage IV (severe) ([place]) 04/12/2012  . Hypothyroidism 04/21/2011  . HYPERTENSION, BENIGN ESSENTIAL 12/08/2006   Past Medical History:  Past Medical History  Diagnosis Date  . Myocardial infarction ([place])   . Coronary artery disease   . Hypertension   . Dysrhythmia   . Peripheral vascular disease ([place])   . Hypothyroidism   . Diabetes mellitus   . Presbyesophagus 04/23/2011  . Urinary retention with incomplete bladder emptying 04/23/2011  . UTI (lower urinary tract infection) 04/21/2011  . Diastolic CHF, chronic (HCC)     Grade I diastolic dysfunction on Echo 04/26/11  . Anaphylaxis 04/25/2011    Thought to be from Rocephin  . History of atrial flutter     ablation  . C. difficile colitis 04/17/2012  . Atrial flutter ([place]) 11/01/2012  . HCAP (healthcare-associated pneumonia) 10/21/2014  . Stroke [facility]) 2003    "lost the use of all of my right side"  . HOH (hard of hearing)   . Cellulitis   . CKD (chronic kidney disease), stage IV ([place])   . Expressive aphasia   . Hyperkalemia   . Hyponatremia    Past Surgical History:  Past Surgical  History  Procedure Laterality Date  . Cholecystectomy    . Colonoscopy  01/15/2011    Procedure: COLONOSCOPY;  Surgeon: Beryle Beams, MD;  [facility];  Service: Endoscopy;  Laterality: N/A;  . Amputation Right 04/14/2012    Procedure: AMPUTATION RAY;  Surgeon: Newt Minion, MD;  [facility];  Service: Orthopedics;  Laterality: Right;  ankle block  . Cardiac electrophysiology mapping and ablation    . Nm myocar perf wall motion  01/23/2009    mild ischemia mid inferior & apical inferior.  EF 75%  . Amputation Right 08/10/2014    Procedure: AMPUTATION RAY;  Surgeon: Mcarthur Rossetti, MD;  [facility];  Service: Orthopedics;  Laterality: Right;  ankle block  . Appendectomy    . Shoulder surgery Right     "put back in place'  . Eye surgery Bilateral     Cararact  . I&d extremity Right 12/03/2014    Procedure: IRRIGATION AND DEBRIDEMENT RIGHT FOOT;  Surgeon: Mcarthur Rossetti, MD;  Location: [place];  Service: Orthopedics;  Laterality: Right;  . Amputation Right 04/06/2015    Procedure: Transmetatarsal AMPUTATION RIGHT FOOT;  Surgeon: Mcarthur Rossetti, MD;  [facility];  Service: Orthopedics;  Laterality: Right;   HPI:  [name] 82 y.o. female with h/o CVA with apparent R side deficits and residual expressive aphasia who was recently discharged (3/14) after admission for cellulitis of the R foot, with subsequent R foot amputation (3/12), diabetes on insulin, atrial flutter, CKD stage IV, hypothyroidism, HTN and  anemia of chronic disease. Pt sent to ED after emesis, L eye and L facial drooping as well as significant dysarthria much worse than baseline. CXR 3/16 no edema or consolidation. MR Brain 3/16 no acute infarct or intracranial hemorrhage. BSE 09/2014 recommended regular diet with thin liquids.   Assessment / Plan / Recommendation Clinical Impression  SLP adminsitered Cognistat standardized assessment to evaluate speech, language and cognition. Pt  currently at baseline level of function with impairments observed due to previous CVA (2003). Flaccid dysarthria, dysnomia and mild memory deficits observed throughout assessment. Pt educated re: results of Cognistat. No acute SLP needs at this time.    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  Overall Cognitive Status: History of cognitive impairments - at baseline Arousal/Alertness: Awake/alert Orientation Level: Oriented to person;Oriented to place;Oriented to situation;Oriented to time Attention: Sustained Sustained Attention: Appears intact Memory: Impaired Memory Impairment: Retrieval deficit;Storage deficit (baseline) Awareness: Appears intact Problem Solving: Appears intact Executive Function: Reasoning Reasoning: Impaired Reasoning Impairment: Verbal basic;Functional basic Safety/Judgment: Appears intact    Comprehension  Auditory Comprehension Overall Auditory Comprehension: Appears within functional limits for tasks assessed Yes/No Questions: Not tested Commands: Within Functional Limits Conversation: Simple Visual Recognition/Discrimination Discrimination: Not tested Reading Comprehension Reading Status: Not tested    Expression Expression Primary Mode of Expression: Verbal Verbal Expression Overall Verbal Expression: Appears within functional limits for tasks assessed Initiation: No impairment Level of Generative/Spontaneous Verbalization: Conversation Repetition: No impairment Naming: Impairment (baseline dysnomia) Confrontation: Within functional  limits Verbal Errors: Semantic paraphasias (x 2) Pragmatics: No impairment Interfering Components: Speech intelligibility Effective Techniques: Open ended questions Non-Verbal Means of Communication: Not applicable Written Expression Dominant Hand: Right Written Expression: Within Functional Limits   Oral / Motor  Oral Motor/Sensory Function Overall Oral Motor/Sensory Function: Within functional limits Motor Speech Overall Motor Speech: Impaired at baseline Respiration: Within functional limits Phonation: Normal Resonance: Within functional limits Articulation: Within functional limitis Intelligibility: Intelligibility reduced Word: 75-100% accurate Phrase: 75-100% accurate Sentence: 50-74% accurate Conversation: 50-74% accurate Motor Planning: Witnin functional limits Motor Speech Errors: Aware Interfering Components: Premorbid status Effective Techniques: Slow rate   GO          Functional Assessment Tool Used:  (skilled clinical judgement) Functional Limitations: Memory Swallow Current Status [id number]): At least 20 percent but less than 40 percent impaired, limited or restricted Swallow Goal Status [phone]): At least 20 percent but less than 40 percent impaired, limited or restricted Swallow Discharge Status [phone]): At least 20 percent but less than 40 percent impaired, limited or restricted Memory Current Status [id number]): At least 40 percent but less than 60 percent impaired, limited or restricted Memory Goal Status [id number]): At least 40 percent but less than 60 percent impaired, limited or restricted Memory Discharge Status [phone]): At least 40 percent but less than 60 percent impaired, limited or restricted         Titus Mould 04/11/2015, 3:40 PM   Titus Mould, Student-SLP

## 2015-04-11 NOTE — Progress Notes (Signed)
STROKE TEAM PROGRESS NOTE   HISTORY OF PRESENT ILLNESS FAUSTINA GIRTZ is an 82 y.o. female patient who presented from a skilled nursing facility for further evaluation of slurred speech symptoms. Her last normal was believed to be at 8 AM. She has a history of chronic stroke affecting her right side with baseline right arm and leg weakness, and right upper extremity incoordination. She has chronic numbness in the right lower extremity. She recently underwent right foot metatarsal amputation 3 days ago. She is at her baseline now during the evaluation the ER. She is able to provide coherent history, and denies any new neurological symptoms.  She has a history of atrial flutter/fibrillation but not on anticoagulation due to falls risk, on aspirin 325 mg daily.   Date last known well: 04/10/2015 Time last known well: 8 A.m. tPA Given: No: Recent right foot metatarsal amputation surgery 4 days ago, no definite clinical evidence for acute stroke   SUBJECTIVE (INTERVAL HISTORY) No family members present. The patient is pleasantly demented. She states she feels back to normal. She is oriented for the most part. She stated that her speech is at baseline. She is wheel chair bound and not walking any more after previous stroke. No fall risk.    OBJECTIVE Temp:  [97.2 F (36.2 C)-98.4 F (36.9 C)] 98.1 F (36.7 C) (03/17 0640) Pulse Rate:  [58-78] 59 (03/17 0640) Cardiac Rhythm:  [-] Normal sinus rhythm (03/16 2004) Resp:  [14-26] 16 (03/17 0640) BP: (93-174)/(27-94) 109/27 mmHg (03/17 0640) SpO2:  [92 %-100 %] 94 % (03/17 0640) Weight:  [72.213 kg (159 lb 3.2 oz)] 72.213 kg (159 lb 3.2 oz) (03/16 1900)  CBC:  Recent Labs Lab 04/04/15 1915  04/08/15 0345 04/10/15 1007 04/10/15 1013  WBC 10.2  < > 9.2 8.8  --   NEUTROABS 6.8  --   --  5.3  --   HGB 11.8*  < > 9.1* 10.1* 11.6*  HCT 36.1  < > 28.4* 32.8* 34.0*  MCV 82.6  < > 81.1 85.6  --   PLT 306  < > 308 348  --   < > = values in  this interval not displayed.  Basic Metabolic Panel:  Recent Labs Lab 04/07/15 0341 04/10/15 1007 04/10/15 1013  NA 136 139 138  K 4.6 4.2 4.2  CL 102 99* 96*  CO2 27 28  --   GLUCOSE 243* 315* 298*  BUN 55* 40* 40*  CREATININE 1.82* 1.77* 1.70*  CALCIUM 8.1* 9.1  --     Lipid Panel:    Component Value Date/Time   CHOL 128 04/11/2015 0615   TRIG 123 04/11/2015 0615   HDL 37* 04/11/2015 0615   CHOLHDL 3.5 04/11/2015 0615   VLDL 25 04/11/2015 0615   LDLCALC 66 04/11/2015 0615   HgbA1c:  Lab Results  Component Value Date   HGBA1C 11.2* 04/10/2015   Urine Drug Screen: No results found for: LABOPIA, COCAINSCRNUR, LABBENZ, AMPHETMU, THCU, LABBARB    IMAGING I have personally reviewed the radiological images below and agree with the radiology interpretations.  Ct Head Wo Contrast 04/10/2015   Chronic atrophic and ischemic changes.  No acute abnormality noted.   Mr [name] Head Wo Contrast 04/10/2015   MRI HEAD  No acute infarct or intracranial hemorrhage. Moderate global atrophy without hydrocephalus. Mild to moderate chronic small vessel disease changes. No intracranial mass lesion noted on this unenhanced exam. Partial opacification left mastoid air cells.  Cervical spondylotic changes with spinal  stenosis and cord flattening most notable C2-3 followed by the C3-4 and C4-5 level.  MRA HEAD  Anterior circulation without medium or large size vessel significant stenosis or occlusion. Moderate moderate narrowing of portions of the middle cerebral artery branches and A2 segment anterior cerebral artery bilaterally. No high-grade stenosis distal vertebral arteries or basilar artery. Moderate posterior circulation branch vessel narrowing and irregularity. High-grade stenosis proximal and distal aspect of the posterior cerebral arteries more notable on the right.   CUS - Bilateral: 1-39% ICA stenosis. Vertebral artery flow is antegrade.  TTE - Left ventricle: The cavity size was  normal. There was mild focal  basal hypertrophy of the septum. Systolic function was vigorous.  The estimated ejection fraction was in the range of 65% to 70%.  Wall motion was normal; there were no regional wall motion  abnormalities. Doppler parameters are consistent with abnormal  left ventricular relaxation (grade 1 diastolic dysfunction).  Doppler parameters are consistent with high ventricular filling  pressure. - Mitral valve: Calcified annulus. - Pulmonary arteries: Systolic pressure was mildly increased. Impressions: - Vigorous LV systolic function; grade 1 diastolic dysfunction with  elevated LV filling pressure; mild TR with mildly elevated  pulmonary pressure.    PHYSICAL EXAM  Temp:  [97.5 F (36.4 C)-98.4 F (36.9 C)] 97.9 F (36.6 C) (03/17 2104) Pulse Rate:  [58-66] 66 (03/17 2104) Resp:  [14-20] 18 (03/17 2104) BP: (93-162)/(27-61) 160/61 mmHg (03/17 2104) SpO2:  [94 %-99 %] 99 % (03/17 2104)  General - Well nourished, well developed, in no apparent distress.  Ophthalmologic - Fundi not visualized due to noncooperative.  Cardiovascular - Regular rate and rhythm.  Mental Status -  Level of arousal and orientation to time, place, and president were intact, but not her age. Language including expression, naming, repetition, comprehension was assessed and found intact, but mild dysarthria. Fund of Knowledge was assessed and was impaired.  Cranial Nerves II - XII - II - Visual field intact OU. III, IV, VI - Extraocular movements intact. V - Facial sensation intact bilaterally. VII - Facial movement intact bilaterally. VIII - Hearing & vestibular intact bilaterally. X - Palate elevates symmetrically XI - Chin turning & shoulder shrug intact bilaterally. XII - Tongue protrusion intact.  Motor Strength - The patient's strength was 4/5 in all extremities and pronator drift was absent. Right toes amputation with dressing. Bulk was normal and  fasciculations were absent.   Motor Tone - Muscle tone was assessed at the neck and appendages and was normal.  Reflexes - The patient's reflexes were 1+ in all extremities and she had no pathological reflexes.  Sensory - Light touch, temperature/pinprick were assessed and were symmetrical    Coordination - The patient had normal movements in the hands and feet with no ataxia or dysmetria.  Tremor was absent.  Gait and Station - not tested due to safety concerns   ASSESSMENT/PLAN Ms. BRANTLEY DOCKTER is a 82 y.o. female with history of recent right foot metatarsal amputation, dementia, hypertension, diabetes mellitus, diastolic congestive heart failure, atrial flutter, chronic kidney disease, and previous stroke presenting with dysarthria. She did not receive IV t-PA due to recent surgery.   Possible TIA - could be small vessel disease due to multiple stroke risk factors vs. embolic secondary to atrial flutter without anticoagulation.  Resultant - resolution of deficits  MRI  no acute infarct  MRA  High-grade stenosis proximal and distal aspect of the PCAs.   Carotid Doppler Bilateral:unremarkable .   2D  Echo EF 65-70%. No SOE  LDL 66  HgbA1c 11.2  VTE prophylaxis - Lovenox  Diet Carb Modified Fluid consistency:: Thin; Room service appropriate?: Yes  aspirin 325 mg daily prior to admission, now on aspirin 325 mg daily. Continue ASA on discharge.   Ongoing aggressive stroke risk factor management  Therapy recommendations: back to SNF   Disposition:  Pending  Aflutter s/p ablation  NSR now  Pt likely not a good anticoagulation candidate due to advanced age, dementia and potential fall risk  Continue full dose ASA  Hypertension  Occasional low blood pressures - currently on Coreg, Cardizem, and isosorbide  BP monitoring  Diabetes  HgbA1c 11.2, goal < 7.0  Uncontrolled  CBG monitroing  On lantus  SSI  Other Stroke Risk Factors  Advanced  age  Cigarette smoker, quit smoking in 1983  ETOH use  Hx stroke/TIA  Other Active Problems  Recent right metatarsal amputation  Dementia   Hospital day # 1  Neurology will sign off. Please call with questions. No neuro follow up needed at this time. Thanks for the consult.  Rosalin Hawking, MD PhD Stroke Neurology 04/12/2015 12:00 AM      To contact Stroke Continuity provider, please refer to [email or web address] After hours, contact General Neurology

## 2015-04-12 DIAGNOSIS — I639 Cerebral infarction, unspecified: Secondary | ICD-10-CM

## 2015-04-12 DIAGNOSIS — E1159 Type 2 diabetes mellitus with other circulatory complications: Secondary | ICD-10-CM | POA: Insufficient documentation

## 2015-04-12 DIAGNOSIS — F0390 Unspecified dementia without behavioral disturbance: Secondary | ICD-10-CM | POA: Insufficient documentation

## 2015-04-12 DIAGNOSIS — G459 Transient cerebral ischemic attack, unspecified: Secondary | ICD-10-CM | POA: Insufficient documentation

## 2015-04-12 LAB — GLUCOSE, CAPILLARY
GLUCOSE-CAPILLARY: 112 mg/dL — AB (ref 65–99)
Glucose-Capillary: 232 mg/dL — ABNORMAL HIGH (ref 65–99)
Glucose-Capillary: 238 mg/dL — ABNORMAL HIGH (ref 65–99)

## 2015-04-12 LAB — HEMOGLOBIN A1C
Hgb A1c MFr Bld: 11.9 % — ABNORMAL HIGH (ref 4.8–5.6)
Mean Plasma Glucose: 295 mg/dL

## 2015-04-12 NOTE — Discharge Summary (Signed)
Physician Discharge Summary  [name] [id number] DOB: 1919-05-24 DOA: 04/10/2015  PCP: Jani Gravel, MD  Admit date: 04/10/2015 Discharge date: 04/12/2015  Time spent: > 35 minutes  Recommendations for Outpatient Follow-up:  1. Monitor blood sugars and blood pressures and adjust medications accordingly   Discharge Diagnoses:  Principal Problem:   CVA (cerebral infarction) Active Problems:   HYPERTENSION, BENIGN ESSENTIAL   Hypothyroidism   Chronic kidney disease (CKD), stage IV (severe) (HCC)   Coronary atherosclerosis of native coronary artery   Diabetes mellitus with renal manifestations, uncontrolled (HCC)   Chronic diastolic CHF (congestive heart failure) (HCC)   History of CVA w/ right side deficits   S/P transmetatarsal amputation of right foot (HCC)   Left ventricular diastolic dysfunction, NYHA class 2   TIA (transient ischemic attack)   Type 2 diabetes mellitus with circulatory disorder ([place])   Dementia   Discharge Condition: stable  Diet recommendation: Carb modified diet  Filed Weights   04/10/15 1900  Weight: 72.213 kg (159 lb 3.2 oz)    History of present illness:  82 y.o. female with history of recent right foot metatarsal amputation, dementia, hypertension, diabetes mellitus, diastolic congestive heart failure, atrial flutter, chronic kidney disease, and previous stroke who presented with dysarthria. She did not receive IV t-PA due to recent surgery  Hospital Course:  Possible TIA - Neurology consulted and recommended the following:  Resultant - resolution of deficits  MRI no acute infarct  MRA High-grade stenosis proximal and distal aspect of the PCAs.   Carotid Doppler Bilateral:unremarkable .   2D Echo EF 65-70%. No SOE  LDL 66  HgbA1c 11.2  VTE prophylaxis - Lovenox  Diet Carb Modified Fluid consistency:: Thin; Room service appropriate?: Yes  aspirin 325 mg daily prior to admission, now on aspirin 325 mg daily. Continue ASA  on discharge.   Ongoing aggressive stroke risk factor management  Therapy recommendations: back to SNF  Disposition: Pending  Aflutter s/p ablation  NSR now  Pt likely not a good anticoagulation candidate due to advanced age, dementia and potential fall risk  Continue full dose ASA  Hypertension  Occasional low blood pressures - currently on Coreg, Cardizem, and isosorbide  BP monitoring  Diabetes  HgbA1c 11.2, goal < 7.0  Uncontrolled  CBG monitroing  On lantus  SSI  Other Stroke Risk Factors  Advanced age  Cigarette smoker, quit smoking in 1983  ETOH use  Hx stroke/TIA    Procedures: Ct Head Wo Contrast 04/10/2015  Chronic atrophic and ischemic changes. No acute abnormality noted.   Mr [name] Head Wo Contrast 04/10/2015  MRI HEAD  No acute infarct or intracranial hemorrhage. Moderate global atrophy without hydrocephalus. Mild to moderate chronic small vessel disease changes. No intracranial mass lesion noted on this unenhanced exam. Partial opacification left mastoid air cells.  Cervical spondylotic changes with spinal stenosis and cord flattening most notable C2-3 followed by the C3-4 and C4-5 level.  MRA HEAD  Anterior circulation without medium or large size vessel significant stenosis or occlusion. Moderate moderate narrowing of portions of the middle cerebral artery branches and A2 segment anterior cerebral artery bilaterally. No high-grade stenosis distal vertebral arteries or basilar artery. Moderate posterior circulation branch vessel narrowing and irregularity. High-grade stenosis proximal and distal aspect of the posterior cerebral arteries more notable on the right.   CUS - Bilateral: 1-39% ICA stenosis. Vertebral artery flow is antegrade.  TTE - Left ventricle: The cavity size was normal. There was mild focal  basal hypertrophy  of the septum. Systolic function was vigorous.  The estimated ejection fraction was in the range of 65% to  70%.  Wall motion was normal; there were no regional wall motion  abnormalities. Doppler parameters are consistent with abnormal  left ventricular relaxation (grade 1 diastolic dysfunction).  Doppler parameters are consistent with high ventricular filling  pressure. - Mitral valve: Calcified annulus. - Pulmonary arteries: Systolic pressure was mildly increased. Impressions: - Vigorous LV systolic function; grade 1 diastolic dysfunction with  elevated LV filling pressure; mild TR with mildly elevated  pulmonary pressure.   Consultations:  Neurology  Discharge Exam: Filed Vitals:   04/12/15 0503 04/12/15 0939  BP: 131/41 120/36  Pulse: 60 65  Temp: 97.7 F (36.5 C) 97.6 F (36.4 C)  Resp: 18 18    General: Pt in nad, alert and awake Cardiovascular: s1 and s2 wnl irregular, no mrg Respiratory: cta bl, no wheezes  Discharge Instructions   Discharge Instructions    Call MD for:  difficulty breathing, headache or visual disturbances    Complete by:  As directed      Call MD for:  temperature >100.4    Complete by:  As directed      Diet - low sodium heart healthy    Complete by:  As directed      Discharge instructions    Complete by:  As directed   Please follow up with your primary care physician in 1-2 weeks or sooner.     Increase activity slowly    Complete by:  As directed           Current Discharge Medication List    CONTINUE these medications which have NOT CHANGED   Details  acetaminophen (TYLENOL) 325 MG tablet Take 325-650 mg by mouth every 6 (six) hours as needed for mild pain, moderate pain, fever or headache.    aspirin 325 MG tablet Take 325 mg by mouth daily. Take for 30 days starting 3/15, then dropping down to 81mg  daily.    carvedilol (COREG) 6.25 MG tablet Take 6.25 mg by mouth 2 (two) times daily with a meal. Reported on 04/10/2015    diltiazem (CARDIZEM CD) 180 MG 24 hr capsule Take 180 mg by mouth at bedtime.     gabapentin  (NEURONTIN) 300 MG capsule Take 300 mg by mouth 3 (three) times daily.     insulin aspart (NOVOLOG) 100 UNIT/ML injection Inject 2-12 Units into the skin 3 (three) times daily before meals. Give no units for BS 0-69 - hypoglycemic protocol & notify MD;  70-140 (0 units),141-180 (2 units), 181-220(4 units), 221-260 (6 units), 261-300(8 units), 301-340(10 units), >340 (12 units) notify MD    insulin glargine (LANTUS) 100 UNIT/ML injection Inject 0.35 mLs (35 Units total) into the skin every morning.    isosorbide mononitrate (IMDUR) 30 MG 24 hr tablet Take 30 mg by mouth at bedtime.     levothyroxine (SYNTHROID, LEVOTHROID) 175 MCG tablet Take 175 mcg by mouth daily before breakfast.    Multiple Vitamin (MULTIVITAMIN) tablet Take 1 tablet by mouth daily.    Tamsulosin HCl (FLOMAX) 0.4 MG CAPS Take 0.4 mg by mouth every morning.     VITAMIN E PO Take 1 capsule by mouth daily. 400 mg capsule for supplement      STOP taking these medications     furosemide (LASIX) 20 MG tablet      aspirin EC 325 MG tablet        Allergies  Allergen  Reactions  . Corn-Containing Products   . Ephedrine     Unknown.    . Morphine Other (See Comments)    Difficulty waking up & talking after surgery  . Rocephin [Ceftriaxone]       The results of significant diagnostics from this hospitalization (including imaging, microbiology, ancillary and laboratory) are listed below for reference.    Significant Diagnostic Studies: Ct Head Wo Contrast  04/10/2015  CLINICAL DATA:  Slurred speech EXAM: CT HEAD WITHOUT CONTRAST TECHNIQUE: Contiguous axial images were obtained from the base of the skull through the vertex without intravenous contrast. COMPARISON:  08/27/2013 FINDINGS: The bony calvarium is intact. No gross soft tissue abnormality is noted. Diffuse atrophic changes are noted. Areas of chronic white matter ischemic change are seen. No findings to suggest acute hemorrhage, acute infarction or  space-occupying mass lesion are noted. IMPRESSION: Chronic atrophic and ischemic changes.  No acute abnormality noted. These results were called by telephone at the time of interpretation on 04/10/2015 at 10:38 am to Dr. Silverio Decamp, who verbally acknowledged these results. Electronically Signed   By: Inez Catalina M.D.   On: 04/10/2015 10:38   Mr Virgel Paling Wo Contrast  04/10/2015  CLINICAL DATA:  82 year old diabetic hypertensive female with prior stroke presenting with near syncopal episode and slurred speech. Subsequent encounter. EXAM: MRI HEAD WITHOUT CONTRAST MRA HEAD WITHOUT CONTRAST TECHNIQUE: Multiplanar, multiecho pulse sequences of the brain and surrounding structures were obtained without intravenous contrast. Angiographic images of the head were obtained using MRA technique without contrast. COMPARISON:  04/10/2015 CT.  02/04/2012 MR and MR angiogram. FINDINGS: MRI HEAD FINDINGS No acute infarct or intracranial hemorrhage. Moderate global atrophy without hydrocephalus. Mild to moderate chronic small vessel disease changes. No intracranial mass lesion noted on this unenhanced exam. Partial opacification left mastoid air cells without obstructing lesion of eustachian tube. Post lens replacement otherwise orbital structures unremarkable. Cervical spondylotic changes with spinal stenosis and cord flattening most notable C2-3 followed by the C3-4 and C4-5 level. MRA HEAD FINDINGS Anterior circulation without medium or large size vessel significant stenosis or occlusion. Moderate moderate narrowing of portions of the middle cerebral artery branches and A2 segment anterior cerebral artery bilaterally. No high-grade stenosis distal vertebral arteries or basilar artery. Moderate posterior circulation branch vessel narrowing and irregularity including high-grade stenosis proximal and distal aspect of the posterior cerebral arteries more notable on the right. No aneurysm noted. IMPRESSION: MRI HEAD No acute infarct  or intracranial hemorrhage. Moderate global atrophy without hydrocephalus. Mild to moderate chronic small vessel disease changes. No intracranial mass lesion noted on this unenhanced exam. Partial opacification left mastoid air cells. Cervical spondylotic changes with spinal stenosis and cord flattening most notable C2-3 followed by the C3-4 and C4-5 level. MRA HEAD Anterior circulation without medium or large size vessel significant stenosis or occlusion. Moderate moderate narrowing of portions of the middle cerebral artery branches and A2 segment anterior cerebral artery bilaterally. No high-grade stenosis distal vertebral arteries or basilar artery. Moderate posterior circulation branch vessel narrowing and irregularity. High-grade stenosis proximal and distal aspect of the posterior cerebral arteries more notable on the right. Electronically Signed   By: Genia Del M.D.   On: 04/10/2015 14:52   Mr Brain Wo Contrast  04/10/2015  CLINICAL DATA:  82 year old diabetic hypertensive female with prior stroke presenting with near syncopal episode and slurred speech. Subsequent encounter. EXAM: MRI HEAD WITHOUT CONTRAST MRA HEAD WITHOUT CONTRAST TECHNIQUE: Multiplanar, multiecho pulse sequences of the brain and surrounding structures were obtained  without intravenous contrast. Angiographic images of the head were obtained using MRA technique without contrast. COMPARISON:  04/10/2015 CT.  02/04/2012 MR and MR angiogram. FINDINGS: MRI HEAD FINDINGS No acute infarct or intracranial hemorrhage. Moderate global atrophy without hydrocephalus. Mild to moderate chronic small vessel disease changes. No intracranial mass lesion noted on this unenhanced exam. Partial opacification left mastoid air cells without obstructing lesion of eustachian tube. Post lens replacement otherwise orbital structures unremarkable. Cervical spondylotic changes with spinal stenosis and cord flattening most notable C2-3 followed by the C3-4 and  C4-5 level. MRA HEAD FINDINGS Anterior circulation without medium or large size vessel significant stenosis or occlusion. Moderate moderate narrowing of portions of the middle cerebral artery branches and A2 segment anterior cerebral artery bilaterally. No high-grade stenosis distal vertebral arteries or basilar artery. Moderate posterior circulation branch vessel narrowing and irregularity including high-grade stenosis proximal and distal aspect of the posterior cerebral arteries more notable on the right. No aneurysm noted. IMPRESSION: MRI HEAD No acute infarct or intracranial hemorrhage. Moderate global atrophy without hydrocephalus. Mild to moderate chronic small vessel disease changes. No intracranial mass lesion noted on this unenhanced exam. Partial opacification left mastoid air cells. Cervical spondylotic changes with spinal stenosis and cord flattening most notable C2-3 followed by the C3-4 and C4-5 level. MRA HEAD Anterior circulation without medium or large size vessel significant stenosis or occlusion. Moderate moderate narrowing of portions of the middle cerebral artery branches and A2 segment anterior cerebral artery bilaterally. No high-grade stenosis distal vertebral arteries or basilar artery. Moderate posterior circulation branch vessel narrowing and irregularity. High-grade stenosis proximal and distal aspect of the posterior cerebral arteries more notable on the right. Electronically Signed   By: Genia Del M.D.   On: 04/10/2015 14:52   Dg Chest Port 1 View  04/10/2015  CLINICAL DATA:  Chronic nonproductive cough EXAM: PORTABLE CHEST 1 VIEW COMPARISON:  October 24, 2014 FINDINGS: There is no edema or consolidation. The heart size and pulmonary vascularity are normal. No adenopathy. No bone lesions. IMPRESSION: No edema or consolidation. Electronically Signed   By: Lowella Grip III M.D.   On: 04/10/2015 10:52   Dg Foot Complete Right  04/04/2015  CLINICAL DATA:  Diabetic foot  wound. EXAM: RIGHT FOOT COMPLETE - 3+ VIEW COMPARISON:  08/08/2014 FINDINGS: Interval amputation of the great toe at the level of the distal metatarsal. There is erosive change of the medial cortex of the mid and proximal first metatarsal suggesting osteomyelitis. There is sclerotic periosteal reaction in this area consistent with chronic bony infection. Chronic fracture of the third metatarsal is unchanged. Prior amputation of the fourth toe is unchanged without evidence of osteomyelitis. IMPRESSION: Findings compatible with chronic osteomyelitis of the first metatarsal. Electronically Signed   By: Franchot Gallo M.D.   On: 04/04/2015 18:24    Microbiology: Recent Results (from the past 240 hour(s))  Blood Cultures x 2 sites     Status: None   Collection Time: 04/04/15 10:10 PM  Result Value Ref Range Status   Specimen Description BLOOD BLOOD RIGHT FOREARM  Final   Special Requests BOTTLES DRAWN AEROBIC AND ANAEROBIC 5ML EA  Final   Culture   Final    NO GROWTH 5 DAYS Performed at [facility]    Report Status 04/09/2015 FINAL  Final  Blood Cultures x 2 sites     Status: None   Collection Time: 04/04/15 10:18 PM  Result Value Ref Range Status   Specimen Description BLOOD BLOOD LEFT FOREARM  Final   Special Requests BOTTLES DRAWN AEROBIC AND ANAEROBIC 5ML EA  Final   Culture   Final    NO GROWTH 5 DAYS Performed at [facility]    Report Status 04/09/2015 FINAL  Final  Surgical pcr screen     Status: None   Collection Time: 04/05/15 12:15 PM  Result Value Ref Range Status   MRSA, PCR NEGATIVE NEGATIVE Final   Staphylococcus aureus NEGATIVE NEGATIVE Final    Comment:        The Xpert SA Assay (FDA approved for NASAL specimens in patients over 13 years of age), is one component of a comprehensive surveillance program.  Test performance has been validated by [facility] for patients greater than or equal to 32 year old. It is not intended to diagnose infection nor  to guide or monitor treatment.   Urine culture     Status: None   Collection Time: 04/10/15 12:43 PM  Result Value Ref Range Status   Specimen Description URINE, RANDOM  Final   Special Requests NONE  Final   Culture MULTIPLE SPECIES PRESENT, SUGGEST RECOLLECTION  Final   Report Status 04/11/2015 FINAL  Final     Labs: Basic Metabolic Panel:  Recent Labs Lab 04/07/15 04/07/15 0341 04/10/15 1007 04/10/15 1013  NA 136* 136 139 138  K 4.6  4.6 4.6 4.2 4.2  CL  --  102 99* 96*  CO2  --  27 28  --   GLUCOSE  --  243* 315* 298*  BUN 55* 55* 40* 40*  CREATININE 1.8* 1.82* 1.77* 1.70*  CALCIUM  --  8.1* 9.1  --    Liver Function Tests:  Recent Labs Lab 04/10/15 1007  AST 15  ALT 10*  ALKPHOS 55  BILITOT 0.2*  PROT 6.5  ALBUMIN 2.6*   No results for input(s): LIPASE, AMYLASE in the last 168 hours. No results for input(s): AMMONIA in the last 168 hours. CBC:  Recent Labs Lab 04/07/15 0341 04/08/15 04/08/15 0345 04/10/15 1007 04/10/15 1013  WBC 10.7* 9.2 9.2 8.8  --   NEUTROABS  --   --   --  5.3  --   HGB 9.2* 9.1* 9.1* 10.1* 11.6*  HCT 28.4* 28* 28.4* 32.8* 34.0*  MCV 81.6  --  81.1 85.6  --   PLT 296 308 308 348  --    Cardiac Enzymes: No results for input(s): CKTOTAL, CKMB, CKMBINDEX, TROPONINI in the last 168 hours. BNP: BNP (last 3 results) No results for input(s): BNP in the last 8760 hours.  ProBNP (last 3 results) No results for input(s): PROBNP in the last 8760 hours.  CBG:  Recent Labs Lab 04/11/15 1124 04/11/15 1628 04/11/15 2128 04/12/15 0652 04/12/15 1125  GLUCAP 176* 182* 171* 112* 232*    Signed:  Velvet Bathe MD.  [facility] 04/12/2015, 1:59 PM

## 2015-04-12 NOTE — Progress Notes (Signed)
Patient D/C to [facility] via [place]. Pt  In stable  Condition.

## 2015-04-12 NOTE — Progress Notes (Addendum)
Pt discharged to facility, report given to Santiago Glad, RN at facility, discharge instructions reviewed with nurse, all questions answered,  assessment stable.  IV removed. Telemetry removed.  [facility] transporting pt. Waiting for [facility] to arrive.

## 2015-04-12 NOTE — Progress Notes (Signed)
Report given to Santiago Glad, Therapist, sports at facility.

## 2015-04-12 NOTE — Progress Notes (Signed)
Social work made aware that pt is being discharged back to [facility]. Will wait to hear back from social work before officially discharging patient.

## 2015-04-12 NOTE — Progress Notes (Signed)
CSW received call from RN, Kyra Searles that patient is for discharge today back to SNF. CSW confirmed with Ivin Booty at [facility] that they would be able to take patient back (although [place] authorization had not been obtained). CSW submitted for authorization & left message on [employer] with [place]. FL2 completed.   Patient is set to discharge back to [facility] today. Patient & daughter, [name] aware. Discharge packet given to RN, Faith. [facility] called for transport to pickup at [place], [place] Worker cell #: [phone]

## 2015-04-12 NOTE — NC FL2 (Signed)
[place] LEVEL OF CARE SCREENING TOOL     IDENTIFICATION  Patient Name: [name] Birthdate: 01/05/1920 Sex: female Admission Date (Current Location): 04/10/2015  [facility] and [place] Number:  Herbalist and Address:  [facility], [place] [address], [place], [place] [address]      Provider Number: [id number]  Attending Physician Name and Address:  Velvet Bathe, MD  Relative Name and Phone Number:       Current Level of Care: Hospital Recommended Level of Care: [place] Prior Approval Number:    Date Approved/Denied:   PASRR Number: [id number]  Discharge Plan: SNF    Current Diagnoses: Patient Active Problem List   Diagnosis Date Noted  . TIA (transient ischemic attack)   . Type 2 diabetes mellitus with circulatory disorder ([place])   . Dementia   . CVA (cerebral infarction) 04/10/2015  . S/P transmetatarsal amputation of right foot ([place]) 04/10/2015  . Left ventricular diastolic dysfunction, NYHA class 2 04/10/2015  . History of CVA w/ right side deficits 04/05/2015  . Chronic osteomyelitis ([place]) 04/05/2015  . Diabetes mellitus with renal manifestations, uncontrolled ([place]) 10/21/2014  . Chronic ulcer of left foot ([place]) 10/21/2014  . Chronic diastolic CHF (congestive heart failure) ([place]) 10/21/2014  . Diabetic neuropathy ([place]) 08/09/2014  . Coronary atherosclerosis of native coronary artery 06/07/2012  . Chronic kidney disease (CKD), stage IV (severe) ([place]) 04/12/2012  . Hypothyroidism 04/21/2011  . HYPERTENSION, BENIGN ESSENTIAL 12/08/2006    Orientation RESPIRATION BLADDER Height & Weight     Self, Time, Situation, Place  Normal Incontinent Weight: 159 lb 3.2 oz (72.213 kg) (taken on 04/10/15 @14 :10) Height:  5\' 6"  (167.6 cm) (taken on 04/10/15 @14 :10)  BEHAVIORAL SYMPTOMS/MOOD NEUROLOGICAL BOWEL NUTRITION STATUS      Continent Diet (Carb Modified)  AMBULATORY STATUS COMMUNICATION OF NEEDS Skin    Extensive Assist Verbally Surgical wounds (Wound/Incision(OpenorDehisced)03/10/17DiabeticulcerOther(Comment)Left & Incision(Closed)03/12/17FootRight)                       Personal Care Assistance Level of Assistance  Bathing, Dressing, Feeding Bathing Assistance: Limited assistance Feeding assistance: Limited assistance Dressing Assistance: Limited assistance     Functional Limitations Info  Sight, Hearing, Speech Sight Info: Adequate Hearing Info: Adequate Speech Info: Adequate    SPECIAL CARE FACTORS FREQUENCY  PT (By licensed PT), OT (By licensed OT)     PT Frequency: 5 OT Frequency: 5            Contractures      Additional Factors Info  Code Status, Allergies, Insulin Sliding Scale Code Status Info: Fullcode Allergies Info: Corn-containing Products, Ephedrine, Morphine, Rocephin   Insulin Sliding Scale Info: Novolog 4x a day       Current Medications (04/12/2015):  This is the current hospital active medication list Current Facility-Administered Medications  Medication Dose Route Frequency Provider Last Rate Last Dose  . aspirin EC tablet 325 mg  325 mg Oral Daily Samella Parr, NP   325 mg at 04/12/15 1008  . carvedilol (COREG) tablet 6.25 mg  6.25 mg Oral BID WC Samella Parr, NP   6.25 mg at 04/12/15 [id number]  . diltiazem (CARDIZEM CD) 24 hr capsule 180 mg  180 mg Oral QHS Samella Parr, NP   180 mg at 04/11/15 2123  . enoxaparin (LOVENOX) injection 30 mg  30 mg Subcutaneous Q24H Wendee Beavers, RPH   30 mg at 04/11/15 2122  . insulin  aspart (novoLOG) injection 0-15 Units  0-15 Units Subcutaneous TID WC Dianne Dun, NP   5 Units at 04/12/15 1239  . insulin aspart (novoLOG) injection 0-5 Units  0-5 Units Subcutaneous QHS Dianne Dun, NP   0 Units at 04/11/15 2128  . insulin glargine (LANTUS) injection 35 Units  35 Units Subcutaneous q morning - 10a Samella Parr, NP   35 Units at 04/12/15 1009  . isosorbide  mononitrate (IMDUR) 24 hr tablet 30 mg  30 mg Oral QHS Samella Parr, NP   30 mg at 04/11/15 2122  . levothyroxine (SYNTHROID, LEVOTHROID) tablet 175 mcg  175 mcg Oral QAC breakfast Samella Parr, NP   175 mcg at 04/12/15 [phone]  . multivitamin with minerals tablet 1 tablet  1 tablet Oral Daily Samella Parr, NP   1 tablet at 04/12/15 1008  . tamsulosin (FLOMAX) capsule 0.4 mg  0.4 mg Oral q morning - 10a Samella Parr, NP   0.4 mg at 04/12/15 1008     Discharge Medications: Please see discharge summary for a list of discharge medications.  Relevant Imaging Results:  Relevant Lab Results:   Additional Information SSN: [id number]  Standley Brooking, LCSW

## 2015-04-12 NOTE — Discharge Instructions (Signed)
Recommendations for Outpatient Follow-up:  1. Monitor blood sugars and blood pressures and adjust medications accordingly  How to Avoid Diabetes Problems You can do a lot to prevent or slow down diabetes problems. Following your diabetes plan and taking care of yourself can reduce your risk of serious or life-threatening complications. Below, you will find certain things you can do to prevent diabetes problems. MANAGE YOUR DIABETES Follow your health care provider's, nurse educator's, and dietitian's instructions for managing your diabetes. They will teach you the basics of diabetes care. They can help answer questions you may have. Learn about diabetes and make healthy choices regarding eating and physical activity. Monitor your blood glucose level regularly. Your health care provider will help you decide how often to check your blood glucose level depending on your treatment goals and how well you are meeting them.  DO NOT USE NICOTINE Nicotine and diabetes are a dangerous combination. Nicotine raises your risk for diabetes problems. If you quit using nicotine, you will lower your risk for heart attack, stroke, nerve disease, and kidney disease. Your cholesterol and your blood pressure levels may improve. Your blood circulation will also improve. Do not use any tobacco products, including cigarettes, chewing tobacco, or electronic cigarettes. If you need help quitting, ask your health care provider. KEEP YOUR BLOOD PRESSURE UNDER CONTROL Your health care provider will determine your individualized target blood pressure based on your age, your medicines, how long you have had diabetes, and any other medical conditions you have. Blood pressure consists of two numbers. Generally, the goal is to keep your top number (systolic pressure) at or below 130, and your bottom number (diastolic pressure) at or below 80. Your health care provider may recommend a lower target blood pressure reading, if appropriate.  Meal planning, medicines, and exercise can help you reach your target blood pressure. Make sure your health care provider checks your blood pressure at every visit. KEEP YOUR CHOLESTEROL UNDER CONTROL Normal cholesterol levels will help prevent heart disease and stroke. These are the biggest health problems for people with diabetes. Keeping cholesterol levels under control can also help with blood flow. Have your cholesterol level checked at least once a year. Your health care provider may prescribe a medicine known as a statin. Statins lower your cholesterol. If you are not taking a statin, ask your health care provider if you should be. Meal planning, exercise, and medicines can help you reach your cholesterol targets.  SCHEDULE AND KEEP YOUR ANNUAL PHYSICAL EXAMS AND EYE EXAMS Your health care provider will tell you how often he or she wants to see you depending on your plan of treatment. It is important that you keep these appointments so that possible problems can be identified early and complications can be avoided or treated. 2. Every visit with your health care provider should include your weight, blood pressure, and an evaluation of your blood glucose control. 3. Your hemoglobin A1c should be checked: 1. At least twice a year if you are at your goal. 2. Every 3 months if there are changes in treatment. 3. If you are not meeting your goals. 4. Your blood lipids should be checked yearly. You should also be checked yearly to see if you have protein in your urine (microalbumin). 5. Schedule a dilated eye exam within 5 years of your diagnosis if you have type 1 diabetes, and then yearly. Schedule a dilated eye exam at diagnosis if you have type 2 diabetes, and then yearly. All exams thereafter can be  extended to every 2 to 3 years if one or more exams have been normal. KEEP YOUR VACCINES CURRENT It is recommended that you receive a flu (influenza) vaccine every year. It is also recommended that you  receive a pneumonia (pneumococcal) vaccine. If you are 37 years of age or older and have never received a pneumonia vaccine, this vaccine may be given as a series of two separate shots. Ask your health care provider which additional vaccines may be recommended. TAKE CARE OF YOUR FEET  Diabetes may cause you to have a poor blood supply (circulation) to your legs and feet. Because of this, the skin may be thinner, break easier, and heal more slowly. You also may have nerve damage in your legs and feet, causing decreased feeling. You may not notice minor injuries to your feet that could lead to serious problems or infections. Taking care of your feet is very important. Visual foot exams are performed at every routine medical visit. The exams check for cuts, injuries, or other problems with the feet. A comprehensive foot exam should be done yearly. This includes visual inspection as well as assessing foot pulses and testing for loss of sensation. You should also do the following:  Inspect your feet daily for cuts, calluses, blisters, ingrown toenails, and signs of infection, such as redness, swelling, or pus.  Wash and dry your feet thoroughly, especially between the toes.  Avoid soaking your feet regularly in hot water baths.  Moisturize dry skin with lotion, avoiding areas between your toes.  Cut toenails straight across and file the edges.  Avoid shoes that do not fit well or have areas that irritate your skin.  Avoid going barefooted or wearing only socks. Your feet need protection. TAKE CARE OF YOUR TEETH People with poorly controlled diabetes are more likely to have gum (periodontal) disease. These infections make diabetes harder to control. Periodontal diseases, if left untreated, can lead to tooth loss. Brush your teeth twice a day, floss, and see your dentist for checkups and cleaning every 6 months, or 2 times a year. ASK YOUR HEALTH CARE PROVIDER ABOUT TAKING ASPIRIN Taking aspirin daily  is recommended to help prevent cardiovascular disease in people with and without diabetes. Ask your health care provider if this would benefit you and what dose he or she would recommend. DRINK RESPONSIBLY Moderate amounts of alcohol (less than 1 drink per day for adult women and less than 2 drinks per day for adult men) have a minimal effect on blood glucose if ingested with food. It is important to eat food with alcohol to avoid hypoglycemia. People should avoid alcohol if they have a history of alcohol abuse or dependence, if they are pregnant, and if they have liver disease, pancreatitis, advanced neuropathy, or severe hypertriglyceridemia. LESSEN STRESS Living with diabetes can be stressful. When you are under stress, your blood glucose may be affected in two ways:  Stress hormones may cause your blood glucose to rise.  You may be distracted from taking good care of yourself. It is a good idea to be aware of your stress level and make changes that are necessary to help you better manage challenging situations. Support groups, planned relaxation, a hobby you enjoy, meditation, healthy relationships, and exercise all work to lower your stress level. If your efforts do not seem to be helping, get help from your health care provider or a trained mental health professional.   This information is not intended to replace advice given to you by  your health care provider. Make sure you discuss any questions you have with your health care provider.   Document Released: 09/29/2010 Document Revised: 02/01/2014 Document Reviewed: 03/07/2013 Elsevier Interactive Patient Education 2016 [employer]. Hypertension Hypertension is another name for high blood pressure. High blood pressure forces your heart to work harder to pump blood. A blood pressure reading has two numbers, which includes a higher number over a lower number (example: 110/72). HOME CARE  6. Have your blood pressure rechecked by your  doctor. 7. Only take medicine as told by your doctor. Follow the directions carefully. The medicine does not work as well if you skip doses. Skipping doses also puts you at risk for problems. 8. Do not smoke. 9. Monitor your blood pressure at home as told by your doctor. GET HELP IF:  You think you are having a reaction to the medicine you are taking.  You have repeat headaches or feel dizzy.  You have puffiness (swelling) in your ankles.  You have trouble with your vision. GET HELP RIGHT AWAY IF:   You get a very bad headache and are confused.  You feel weak, numb, or faint.  You get chest or belly (abdominal) pain.  You throw up (vomit).  You cannot breathe very well. MAKE SURE YOU:   Understand these instructions.  Will watch your condition.  Will get help right away if you are not doing well or get worse.   This information is not intended to replace advice given to you by your health care provider. Make sure you discuss any questions you have with your health care provider.   Document Released: 06/30/2007 Document Revised: 01/16/2013 Document Reviewed: 11/03/2012 Elsevier Interactive Patient Education [employer].

## 2015-04-14 ENCOUNTER — Non-Acute Institutional Stay: Payer: Medicare Other | Admitting: Adult Health

## 2015-04-14 ENCOUNTER — Encounter: Payer: Self-pay | Admitting: Adult Health

## 2015-04-14 DIAGNOSIS — I4892 Unspecified atrial flutter: Secondary | ICD-10-CM

## 2015-04-14 DIAGNOSIS — R339 Retention of urine, unspecified: Secondary | ICD-10-CM

## 2015-04-14 DIAGNOSIS — G629 Polyneuropathy, unspecified: Secondary | ICD-10-CM | POA: Diagnosis not present

## 2015-04-14 DIAGNOSIS — E1165 Type 2 diabetes mellitus with hyperglycemia: Secondary | ICD-10-CM

## 2015-04-14 DIAGNOSIS — E039 Hypothyroidism, unspecified: Secondary | ICD-10-CM

## 2015-04-14 DIAGNOSIS — D62 Acute posthemorrhagic anemia: Secondary | ICD-10-CM

## 2015-04-14 DIAGNOSIS — R5381 Other malaise: Secondary | ICD-10-CM | POA: Diagnosis not present

## 2015-04-14 DIAGNOSIS — L03115 Cellulitis of right lower limb: Secondary | ICD-10-CM | POA: Diagnosis not present

## 2015-04-14 DIAGNOSIS — IMO0002 Reserved for concepts with insufficient information to code with codable children: Secondary | ICD-10-CM

## 2015-04-14 DIAGNOSIS — I639 Cerebral infarction, unspecified: Secondary | ICD-10-CM | POA: Diagnosis not present

## 2015-04-14 DIAGNOSIS — E1140 Type 2 diabetes mellitus with diabetic neuropathy, unspecified: Secondary | ICD-10-CM

## 2015-04-14 DIAGNOSIS — I5032 Chronic diastolic (congestive) heart failure: Secondary | ICD-10-CM

## 2015-04-14 DIAGNOSIS — N184 Chronic kidney disease, stage 4 (severe): Secondary | ICD-10-CM | POA: Diagnosis not present

## 2015-04-14 DIAGNOSIS — M8660 Other chronic osteomyelitis, unspecified site: Secondary | ICD-10-CM | POA: Diagnosis not present

## 2015-04-14 DIAGNOSIS — E46 Unspecified protein-calorie malnutrition: Secondary | ICD-10-CM

## 2015-04-14 NOTE — Progress Notes (Addendum)
Patient ID: [name], female   DOB: Aug 27, 1919, 82 y.o.   MRN: [id number]    DATE:  04/14/15  MRN:  [id number]  BIRTHDAY: 09-Mar-1919  Facility:  [facility]urs[facility] Home Location:  [place] Room Number: 208-P  LEVEL OF CARE:  SNF (31)  Contact Information    Name Relation Home Work [place] Daughter [phone]  [phone]       Code Status History    Date Active Date Inactive Code Status Order ID Comments User Context   04/10/2015  6:53 PM 04/12/2015 11:07 PM Full Code [id number]  Samella Parr, NP Inpatient   04/04/2015  9:56 PM 04/08/2015  6:23 PM Full Code [id number]  Norval Morton, MD ED   10/22/2014 11:27 AM 10/25/2014  8:06 PM DNR [id number]  Thurnell Lose, MD Inpatient   10/21/2014  5:31 PM 10/22/2014 11:27 AM Full Code [id number]  Robbie Lis, MD Inpatient   08/08/2014  5:20 PM 08/13/2014  6:08 PM Full Code [id number]  Theodis Blaze, MD Inpatient   11/18/2013 12:57 PM 11/21/2013  4:33 PM Full Code [id number]  Barton Dubois, MD Inpatient   08/28/2013 12:55 AM 08/30/2013  7:16 PM Full Code [id number]  Hosie Poisson, MD Inpatient   04/14/2012  7:40 PM 04/17/2012  5:41 PM Full Code [id number]  Newt Minion, MD Inpatient   04/12/2012 12:18 AM 04/14/2012  7:40 PM Full Code [id number]  Janell Quiet, MD Inpatient   04/21/2011  9:40 PM 04/27/2011  7:19 PM Full Code [id number]  Janae Bridgeman, RN Inpatient       Chief Complaint  Patient presents with  . Hospitalization Follow-up    HISTORY OF PRESENT ILLNESS:  This is a 82 year old female who has been re-admitted to [facility] on 04/12/15 from [facility]. She was having a short-term rehabilitation @ [facility] due to a recent right foot metatarsal amputation. She was noted to have slurred speech and was sent to the hospital. She did not receive IV tPA due to recent surgery. She has a history of chronic stroke affecting her right side with baseline right arm and leg weakness and right upper  extremity incoordination. She has chronic numbness in the lower extremity. She, also, has history of atrial flutter/fibrillation but not on anticoagulation due to fall risk but on ASA 325 mg daily.  MRI shows no acute infarct. MRA shows high-grade stenosis proximal and distal aspect of the PCAs.  She has been re-admitted for a short-term rehabilitation.   PAST MEDICAL HISTORY:  Past Medical History  Diagnosis Date  . Myocardial infarction ([place])   . Coronary artery disease   . Hypertension   . Dysrhythmia   . Peripheral vascular disease ([place])   . Hypothyroidism   . Type 2 diabetes mellitus with diabetic polyneuropathy, with long-term current use of insulin ([place])   . Presbyesophagus 04/23/2011  . Urinary retention with incomplete bladder emptying 04/23/2011  . UTI (lower urinary tract infection) 04/21/2011  . Diastolic CHF, chronic (HCC)     Grade I diastolic dysfunction on Echo 04/26/11  . Anaphylaxis 04/25/2011    Thought to be from Rocephin  . History of atrial flutter     ablation  . C. difficile colitis 04/17/2012  . Atrial flutter ([place]) 11/01/2012  . HCAP (healthcare-associated pneumonia) 10/21/2014  . Stroke [facility]) 2003    "lost the use of all of my right side"  . HOH (hard of hearing)   .  CKD (chronic kidney disease), stage IV ([place])   . Expressive aphasia   . Hyperkalemia   . Hyponatremia   . Physical deconditioning   . Cellulitis of right foot   . Chronic osteomyelitis ([place])   . Chronic diastolic CHF (congestive heart failure) ([place])   . Neuropathy ([place])   . Acute blood loss anemia      CURRENT MEDICATIONS: Reviewed  Patient's Medications  New Prescriptions   No medications on file  Previous Medications   ACETAMINOPHEN (TYLENOL) 325 MG TABLET    Take 325-650 mg by mouth every 6 (six) hours as needed for mild pain, moderate pain, fever or headache.   ASPIRIN 325 MG TABLET    Take 325 mg by mouth daily. Take for 30 days starting 3/15, then dropping down to 81mg  daily.    ASPIRIN 81 MG CHEWABLE TABLET    Chew 81 mg by mouth daily.   CARVEDILOL (COREG) 6.25 MG TABLET    Take 6.25 mg by mouth 2 (two) times daily with a meal. Reported on 04/10/2015   DILTIAZEM (CARDIZEM CD) 180 MG 24 HR CAPSULE    Take 180 mg by mouth at bedtime.    DOXYCYCLINE (VIBRA-TABS) 100 MG TABLET    Take 100 mg by mouth 2 (two) times daily. Take for 2 weeks for right foot cellulitis   GABAPENTIN (NEURONTIN) 300 MG CAPSULE    Take 300 mg by mouth 3 (three) times daily.    INSULIN ASPART (NOVOLOG) 100 UNIT/ML INJECTION    Inject 2-12 Units into the skin 3 (three) times daily before meals. Give no units for BS 0-69 - hypoglycemic protocol & notify MD;  70-140 (0 units),141-180 (2 units), 181-220(4 units), 221-260 (6 units), 261-300(8 units), 301-340(10 units), >340 (12 units) notify MD   INSULIN GLARGINE (LANTUS) 100 UNIT/ML INJECTION    Inject 0.35 mLs (35 Units total) into the skin every morning.   ISOSORBIDE MONONITRATE (IMDUR) 30 MG 24 HR TABLET    Take 30 mg by mouth at bedtime.    LEVOTHYROXINE (SYNTHROID, LEVOTHROID) 175 MCG TABLET    Take 175 mcg by mouth daily before breakfast.   MULTIPLE VITAMIN (MULTIVITAMIN) TABLET    Take 1 tablet by mouth daily.   PROTEIN (PROCEL) POWD    Take 2 scoop by mouth 2 (two) times daily.   SACCHAROMYCES BOULARDII (FLORASTOR) 250 MG CAPSULE    Take 250 mg by mouth 2 (two) times daily. Take for 13 days   TAMSULOSIN HCL (FLOMAX) 0.4 MG CAPS    Take 0.4 mg by mouth every morning.    VITAMIN E PO    Take 1 capsule by mouth daily. 400 mg capsule for supplement  Modified Medications   No medications on file  Discontinued Medications   No medications on file     Allergies  Allergen Reactions  . Corn-Containing Products   . Ephedrine     Unknown.    . Morphine Other (See Comments)    Difficulty waking up & talking after surgery  . Rocephin [Ceftriaxone]      REVIEW OF SYSTEMS:  GENERAL: no change in appetite, no fatigue, no weight changes, no fever,  chills or weakness EYES: Denies change in vision, dry eyes, eye pain, itching or discharge EARS: Denies change in hearing, ringing in ears, or earache NOSE: Denies nasal congestion or epistaxis MOUTH and THROAT: Denies oral discomfort, gingival pain or bleeding, pain from teeth or hoarseness   RESPIRATORY: no cough, SOB, DOE, wheezing, hemoptysis CARDIAC: no  chest pain, edema or palpitations GI: no abdominal pain, diarrhea, constipation, heart burn, nausea or vomiting GU: Denies dysuria, frequency, hematuria, incontinence, or discharge PSYCHIATRIC: Denies feeling of depression or anxiety. No report of hallucinations, insomnia, paranoia, or agitation   PHYSICAL EXAMINATION  GENERAL APPEARANCE: Well nourished. In no acute distress. Normal body habitus SKIN:  Right foot is covered with unna boot. HEAD: Normal in size and contour. No evidence of trauma EYES: Lids open and close normally. No blepharitis, entropion or ectropion. PERRL. Conjunctivae are clear and sclerae are white. Lenses are without opacity EARS: Pinnae are normal. Patient hears normal voice tunes of the examiner MOUTH and THROAT: Lips are without lesions. Oral mucosa is moist and without lesions. Tongue is normal in shape, size, and color and without lesions NECK: supple, trachea midline, no neck masses, no thyroid tenderness, no thyromegaly LYMPHATICS: no LAN in the neck, no supraclavicular LAN RESPIRATORY: breathing is even & unlabored, BS CTAB CARDIAC: Irregularly irregular, no murmur,no extra heart sounds, RLE trace edema GI: abdomen soft, normal BS, no masses, no tenderness, no hepatomegaly, no splenomegaly EXTREMITIES:  Able to move X 4 extremities, generalized weakness PSYCHIATRIC: Alert and oriented X 3. Affect and behavior are appropriate  LABS/RADIOLOGY: Labs reviewed: Basic Metabolic Panel:  Recent Labs  10/21/14 1826  04/05/15 0419  04/07/15 0341  04/10/15 1007 04/10/15 1013   NA 135  < > 136  < > 136  <  > 139 138   K 3.8  < > 4.6  < > 4.6  < > 4.2 4.2   CL 102  < > 101  --  102  --  99* 96*   CO2 23  < > 25  --  27  --  28  --    GLUCOSE 328*  < > 250*  --  243*  --  315* 298*   BUN 51*  < > 53*  < > 55*  < > 40* 40*   CREATININE 1.85*  < > 1.72*  < > 1.82*  < > 1.77* 1.70*   CALCIUM 8.3*  < > 8.6*  --  8.1*  --  9.1  --    MG 1.6*  --   --   --   --   --   --   --    PHOS 3.0  --   --   --   --   --   --   --    < > = values in this interval not displayed. Liver Function Tests:  Recent Labs  10/21/14 1826 12/03/14 1303 04/10/15 1007  AST 15 19 15   ALT 12* 12* 10*  ALKPHOS 94 67 55  BILITOT 0.6 0.5 0.2*  PROT 7.0 7.2 6.5  ALBUMIN 2.4* 3.2* 2.6*   CBC:  Recent Labs  04/07/15 0341  04/08/15 0345 04/10/15 04/10/15 1007 04/10/15 1013   WBC 10.7*  < > 9.2 8.7 8.8  --    NEUTROABS  --   --   --  5 5.3  --    HGB 9.2*  < > 9.1* 9.7* 10.1* 11.6*   HCT 28.4*  < > 28.4* 32* 32.8* 34.0*   MCV 81.6  --  81.1  --  85.6  --    PLT 296  < > 308 393 348  --    < > = values in this interval not displayed.  Lipid Panel:  Recent Labs  04/11/15 0615  HDL 37*   Cardiac  Enzymes:  Recent Labs  10/22/14 0513 10/22/14 1317 10/22/14 1818  TROPONINI 0.09* 0.04* 0.03   CBG:  Recent Labs  04/12/15 0652 04/12/15 1125 04/12/15 1628  GLUCAP 112* 232* 238*      Ct Head Wo Contrast  04/10/2015  CLINICAL DATA:  Slurred speech EXAM: CT HEAD WITHOUT CONTRAST TECHNIQUE: Contiguous axial images were obtained from the base of the skull through the vertex without intravenous contrast. COMPARISON:  08/27/2013 FINDINGS: The bony calvarium is intact. No gross soft tissue abnormality is noted. Diffuse atrophic changes are noted. Areas of chronic white matter ischemic change are seen. No findings to suggest acute hemorrhage, acute infarction or space-occupying mass lesion are noted. IMPRESSION: Chronic atrophic and ischemic changes.  No acute abnormality noted. These results were called by  telephone at the time of interpretation on 04/10/2015 at 10:38 am to Dr. Silverio Decamp, who verbally acknowledged these results. Electronically Signed   By: Inez Catalina M.D.   On: 04/10/2015 10:38   Mr Virgel Paling Wo Contrast  04/10/2015  CLINICAL DATA:  82 year old diabetic hypertensive female with prior stroke presenting with near syncopal episode and slurred speech. Subsequent encounter. EXAM: MRI HEAD WITHOUT CONTRAST MRA HEAD WITHOUT CONTRAST TECHNIQUE: Multiplanar, multiecho pulse sequences of the brain and surrounding structures were obtained without intravenous contrast. Angiographic images of the head were obtained using MRA technique without contrast. COMPARISON:  04/10/2015 CT.  02/04/2012 MR and MR angiogram. FINDINGS: MRI HEAD FINDINGS No acute infarct or intracranial hemorrhage. Moderate global atrophy without hydrocephalus. Mild to moderate chronic small vessel disease changes. No intracranial mass lesion noted on this unenhanced exam. Partial opacification left mastoid air cells without obstructing lesion of eustachian tube. Post lens replacement otherwise orbital structures unremarkable. Cervical spondylotic changes with spinal stenosis and cord flattening most notable C2-3 followed by the C3-4 and C4-5 level. MRA HEAD FINDINGS Anterior circulation without medium or large size vessel significant stenosis or occlusion. Moderate moderate narrowing of portions of the middle cerebral artery branches and A2 segment anterior cerebral artery bilaterally. No high-grade stenosis distal vertebral arteries or basilar artery. Moderate posterior circulation branch vessel narrowing and irregularity including high-grade stenosis proximal and distal aspect of the posterior cerebral arteries more notable on the right. No aneurysm noted. IMPRESSION: MRI HEAD No acute infarct or intracranial hemorrhage. Moderate global atrophy without hydrocephalus. Mild to moderate chronic small vessel disease changes. No intracranial  mass lesion noted on this unenhanced exam. Partial opacification left mastoid air cells. Cervical spondylotic changes with spinal stenosis and cord flattening most notable C2-3 followed by the C3-4 and C4-5 level. MRA HEAD Anterior circulation without medium or large size vessel significant stenosis or occlusion. Moderate moderate narrowing of portions of the middle cerebral artery branches and A2 segment anterior cerebral artery bilaterally. No high-grade stenosis distal vertebral arteries or basilar artery. Moderate posterior circulation branch vessel narrowing and irregularity. High-grade stenosis proximal and distal aspect of the posterior cerebral arteries more notable on the right. Electronically Signed   By: Genia Del M.D.   On: 04/10/2015 14:52   Mr Brain Wo Contrast  04/10/2015  CLINICAL DATA:  82 year old diabetic hypertensive female with prior stroke presenting with near syncopal episode and slurred speech. Subsequent encounter. EXAM: MRI HEAD WITHOUT CONTRAST MRA HEAD WITHOUT CONTRAST TECHNIQUE: Multiplanar, multiecho pulse sequences of the brain and surrounding structures were obtained without intravenous contrast. Angiographic images of the head were obtained using MRA technique without contrast. COMPARISON:  04/10/2015 CT.  02/04/2012 MR and MR angiogram. FINDINGS: MRI HEAD  FINDINGS No acute infarct or intracranial hemorrhage. Moderate global atrophy without hydrocephalus. Mild to moderate chronic small vessel disease changes. No intracranial mass lesion noted on this unenhanced exam. Partial opacification left mastoid air cells without obstructing lesion of eustachian tube. Post lens replacement otherwise orbital structures unremarkable. Cervical spondylotic changes with spinal stenosis and cord flattening most notable C2-3 followed by the C3-4 and C4-5 level. MRA HEAD FINDINGS Anterior circulation without medium or large size vessel significant stenosis or occlusion. Moderate moderate  narrowing of portions of the middle cerebral artery branches and A2 segment anterior cerebral artery bilaterally. No high-grade stenosis distal vertebral arteries or basilar artery. Moderate posterior circulation branch vessel narrowing and irregularity including high-grade stenosis proximal and distal aspect of the posterior cerebral arteries more notable on the right. No aneurysm noted. IMPRESSION: MRI HEAD No acute infarct or intracranial hemorrhage. Moderate global atrophy without hydrocephalus. Mild to moderate chronic small vessel disease changes. No intracranial mass lesion noted on this unenhanced exam. Partial opacification left mastoid air cells. Cervical spondylotic changes with spinal stenosis and cord flattening most notable C2-3 followed by the C3-4 and C4-5 level. MRA HEAD Anterior circulation without medium or large size vessel significant stenosis or occlusion. Moderate moderate narrowing of portions of the middle cerebral artery branches and A2 segment anterior cerebral artery bilaterally. No high-grade stenosis distal vertebral arteries or basilar artery. Moderate posterior circulation branch vessel narrowing and irregularity. High-grade stenosis proximal and distal aspect of the posterior cerebral arteries more notable on the right. Electronically Signed   By: Genia Del M.D.   On: 04/10/2015 14:52   Dg Chest Port 1 View  04/10/2015  CLINICAL DATA:  Chronic nonproductive cough EXAM: PORTABLE CHEST 1 VIEW COMPARISON:  October 24, 2014 FINDINGS: There is no edema or consolidation. The heart size and pulmonary vascularity are normal. No adenopathy. No bone lesions. IMPRESSION: No edema or consolidation. Electronically Signed   By: Lowella Grip III M.D.   On: 04/10/2015 10:52   Dg Foot Complete Right  04/04/2015  CLINICAL DATA:  Diabetic foot wound. EXAM: RIGHT FOOT COMPLETE - 3+ VIEW COMPARISON:  08/08/2014 FINDINGS: Interval amputation of the great toe at the level of the distal  metatarsal. There is erosive change of the medial cortex of the mid and proximal first metatarsal suggesting osteomyelitis. There is sclerotic periosteal reaction in this area consistent with chronic bony infection. Chronic fracture of the third metatarsal is unchanged. Prior amputation of the fourth toe is unchanged without evidence of osteomyelitis. IMPRESSION: Findings compatible with chronic osteomyelitis of the first metatarsal. Electronically Signed   By: Franchot Gallo M.D.   On: 04/04/2015 18:24    ASSESSMENT/PLAN:  Physical deconditioning - for rehabilitation  CVA - continue ASA 325 mg daily  Cellulitis of right foot/Osteomyelitis S/P metatarsal amputation of right foot - RLE WBAT; ASA 325 mg daily DVT prophylaxis; continue acetaminophen 325 mg 1-2 tabs =  by mouth every 6 hours when necessary for pain; follow-up with Dr. Ninfa Linden, orthopedic surgeon, in 2 weeks  Atrial flutter - rate controlled; continue carvedilol 6.25 mg 1 tab by mouth twice a day and diltiazem 180 mg 24 hour 1 capsule by mouth daily at bedtime  Chronic diastolic CHF - stable; continue Lasix 20 mg 1 tab by mouth twice a day  Neuropathy - stable; continue Neurontin 300 mg 1 capsule by mouth 3 times a day   Diabetes mellitus type 2 uncontrolled - hgbA1c 11.9; continue NovoLog sliding scale 3 times a day and Lantus  35 units subcutaneous every morning  Hypothyroidism - continue Synthroid 175 g 1 tab PO daily  Urinary retention - continue Flomax 0.4 mg 1 capsule by mouth every morning chronic kidney disease, stage IV  Chronic kidney disease, stage IV - creatinine 1.77; check BMP  Anemia, acute blood loss - hemoglobin 10.10.1 ; check CBC  Protein-calorie malnutrition, severe - albumin 2.6; started on [place] 2 scoops BID    Goals of care:  Short-term rehabilitation    [facility], NP [facility] [phone]

## 2015-04-15 ENCOUNTER — Encounter: Payer: Self-pay | Admitting: Internal Medicine

## 2015-04-15 ENCOUNTER — Non-Acute Institutional Stay (SKILLED_NURSING_FACILITY): Payer: Medicare Other | Admitting: Internal Medicine

## 2015-04-15 DIAGNOSIS — I10 Essential (primary) hypertension: Secondary | ICD-10-CM | POA: Diagnosis not present

## 2015-04-15 DIAGNOSIS — E1122 Type 2 diabetes mellitus with diabetic chronic kidney disease: Secondary | ICD-10-CM

## 2015-04-15 DIAGNOSIS — IMO0002 Reserved for concepts with insufficient information to code with codable children: Secondary | ICD-10-CM

## 2015-04-15 DIAGNOSIS — I4891 Unspecified atrial fibrillation: Secondary | ICD-10-CM | POA: Diagnosis not present

## 2015-04-15 DIAGNOSIS — D638 Anemia in other chronic diseases classified elsewhere: Secondary | ICD-10-CM

## 2015-04-15 DIAGNOSIS — N184 Chronic kidney disease, stage 4 (severe): Secondary | ICD-10-CM | POA: Diagnosis not present

## 2015-04-15 DIAGNOSIS — E039 Hypothyroidism, unspecified: Secondary | ICD-10-CM | POA: Diagnosis not present

## 2015-04-15 DIAGNOSIS — E1340 Other specified diabetes mellitus with diabetic neuropathy, unspecified: Secondary | ICD-10-CM

## 2015-04-15 DIAGNOSIS — E1165 Type 2 diabetes mellitus with hyperglycemia: Secondary | ICD-10-CM

## 2015-04-15 DIAGNOSIS — I639 Cerebral infarction, unspecified: Secondary | ICD-10-CM

## 2015-04-15 DIAGNOSIS — M8660 Other chronic osteomyelitis, unspecified site: Secondary | ICD-10-CM | POA: Diagnosis not present

## 2015-04-15 DIAGNOSIS — E46 Unspecified protein-calorie malnutrition: Secondary | ICD-10-CM

## 2015-04-15 DIAGNOSIS — Z794 Long term (current) use of insulin: Secondary | ICD-10-CM

## 2015-04-15 DIAGNOSIS — R5381 Other malaise: Secondary | ICD-10-CM

## 2015-04-15 DIAGNOSIS — I5032 Chronic diastolic (congestive) heart failure: Secondary | ICD-10-CM | POA: Diagnosis not present

## 2015-04-15 LAB — CBC AND DIFFERENTIAL
HCT: 33 % — AB (ref 36–46)
Hemoglobin: 10.2 g/dL — AB (ref 12.0–16.0)
Neutrophils Absolute: 6 /uL
Platelets: 407 K/µL — AB (ref 150–399)
WBC: 9.7 10*3/mL

## 2015-04-15 LAB — BASIC METABOLIC PANEL WITH GFR
BUN: 32 mg/dL — AB (ref 4–21)
Creatinine: 1.6 mg/dL — AB (ref 0.5–1.1)
Glucose: 121 mg/dL
Potassium: 4.9 mmol/L (ref 3.4–5.3)
Sodium: 140 mmol/L (ref 137–147)

## 2015-04-15 NOTE — Progress Notes (Signed)
LOCATION: [place]  PCP: Jani Gravel, MD   Code Status: Full Code  Goals of care: Advanced Directive information Advanced Directives 04/10/2015  Does patient have an advance directive? No;Yes  Type of Paramedic of [place];Living will  Does patient want to make changes to advanced directive? -  Copy of advanced directive(s) in chart? -  Would patient like information on creating an advanced directive? Yes - Scientist, clinical (histocompatibility and immunogenetics) given       Extended Emergency Contact Information Primary Emergency Contact: Mahoney,Beverly Address: [place], [place] [address] [place] Phone: [phone] Mobile Phone: [phone] Relation: Daughter   Allergies  Allergen Reactions  . Corn-Containing Products   . Ephedrine     Unknown.    . Morphine Other (See Comments)    Difficulty waking up & talking after surgery  . Rocephin [Ceftriaxone]     Chief Complaint  Patient presents with  . Readmit To SNF    Readmission     HPI:  Patient is a 82 y.o. female seen today for short term rehabilitation post hospital re-admission from 04/10/15-04/12/15 with acute CVA. She was seen by neurology. MRI brain, echocardiogram and carotid doppler study were unremarkable. MRA neck showed high grade stenosis of proximal and distal aspect of PCA. Aspirin was continued. Of note she was in [facility] undergoing STR for right foot metatarsal amputation. She has PMH of atrial flutter/ afib, DM, HTN, old CVA. She is seen in her room today. She denies any concerns.   Review of Systems:  Constitutional: Negative for fever, chills, diaphoresis. Energy is slowly coming back.  HENT: Negative for headache, congestion, nasal discharge, hearing loss, sore throat, difficulty swallowing.   Eyes: Negative for blurred vision, double vision and discharge. Wears glasses.  Respiratory: Negative for shortness of breath and wheezing. Positive for dry  cough.  Cardiovascular: Negative for chest pain, palpitations, leg swelling.  Gastrointestinal: Negative for heartburn, nausea, vomiting, abdominal pain, loss of appetite, melena, diarrhea and constipation. Last bowel movement was today. Genitourinary: Negative for dysuria and flank pain.  Musculoskeletal: Negative for back pain, fall.  Skin: Negative for itching, rash.  Neurological: positive for weakness and negative for dizziness. Psychiatric/Behavioral: Negative for depression    Past Medical History  Diagnosis Date  . Myocardial infarction ([place])   . Coronary artery disease   . Hypertension   . Dysrhythmia   . Peripheral vascular disease ([place])   . Hypothyroidism   . Type 2 diabetes mellitus with diabetic polyneuropathy, with long-term current use of insulin ([place])   . Presbyesophagus 04/23/2011  . Urinary retention with incomplete bladder emptying 04/23/2011  . UTI (lower urinary tract infection) 04/21/2011  . Diastolic CHF, chronic (H[facility])     Grade I diastolic dysfunction on Echo 04/26/11  . Anaphylaxis 04/25/2011    Thought to be from Rocephin  . History of atrial flutter     ablation  . C. difficile colitis 04/17/2012  . Atrial flutter ([place]) 11/01/2012  . HCAP (healthcare-associated pneumonia) 10/21/2014  . Stroke [facility]) 2003    "lost the use of all of my right side"  . HOH (hard of hearing)   . CKD (chronic kidney disease), stage IV ([place])   . Expressive aphasia   . Hyperkalemia   . Hyponatremia   . Physical deconditioning   . Cellulitis of right foot   . Chronic osteomyelitis ([place])   . Chronic diastolic CHF (  congestive heart failure) ([place])   . Neuropathy ([place])   . Acute blood loss anemia    Past Surgical History  Procedure Laterality Date  . Cholecystectomy    . Colonoscopy  01/15/2011    Procedure: COLONOSCOPY;  Surgeon: Beryle Beams, MD;  Location: [facility] ENDOSCOPY;  Service: Endoscopy;  Laterality: N/A;  . Amputation Right 04/14/2012    Procedure: AMPUTATION RAY;   Surgeon: Newt Minion, MD;  Location: [facility] ORS;  Service: Orthopedics;  Laterality: Right;  ankle block  . Cardiac electrophysiology mapping and ablation    . Nm myocar perf wall motion  01/23/2009    mild ischemia mid inferior & apical inferior.  EF 75%  . Amputation Right 08/10/2014    Procedure: AMPUTATION RAY;  Surgeon: Mcarthur Rossetti, MD;  Location: [facility] ORS;  Service: Orthopedics;  Laterality: Right;  ankle block  . Appendectomy    . Shoulder surgery Right     "put back in place'  . Eye surgery Bilateral     Cararact  . I&d extremity Right 12/03/2014    Procedure: IRRIGATION AND DEBRIDEMENT RIGHT FOOT;  Surgeon: Mcarthur Rossetti, MD;  Location: [place];  Service: Orthopedics;  Laterality: Right;  . Amputation Right 04/06/2015    Procedure: Transmetatarsal AMPUTATION RIGHT FOOT;  Surgeon: Mcarthur Rossetti, MD;  Location: [facility];  Service: Orthopedics;  Laterality: Right;   Social History:   reports that she quit smoking about 34 years ago. Her smoking use included Cigarettes. She has a 4 pack-year smoking history. She has never used smokeless tobacco. She reports that she drinks alcohol. She reports that she does not use illicit drugs.  Family History  Problem Relation Age of Onset  . Cancer Sister   . Diabetes Mother   . Heart disease Father     Medications:   Medication List       This list is accurate as of: 04/15/15  3:01 PM.  Always use your most recent med list.               acetaminophen 325 MG tablet  Commonly known as:  TYLENOL  Take 325-650 mg by mouth every 6 (six) hours as needed for mild pain, moderate pain, fever or headache.     aspirin 325 MG tablet  Take 325 mg by mouth daily. Take for 30 days starting 3/15, then dropping down to 81mg  daily.     carvedilol 6.25 MG tablet  Commonly known as:  COREG  Take 6.25 mg by mouth 2 (two) times daily with a meal. Reported on 04/10/2015     diltiazem 180 MG 24 hr capsule  Commonly known as:   CARDIZEM CD  Take 180 mg by mouth at bedtime.     gabapentin 300 MG capsule  Commonly known as:  NEURONTIN  Take 300 mg by mouth 3 (three) times daily.     insulin aspart 100 UNIT/ML injection  Commonly known as:  novoLOG  Inject 2-12 Units into the skin 3 (three) times daily before meals. Give no units for BS 0-69 - hypoglycemic protocol & notify MD;  70-140 (0 units),141-180 (2 units), 181-220(4 units), 221-260 (6 units), 261-300(8 units), 301-340(10 units), >340 (12 units) notify MD     insulin glargine 100 UNIT/ML injection  Commonly known as:  LANTUS  Inject 0.35 mLs (35 Units total) into the skin every morning.     isosorbide mononitrate 30 MG 24 hr tablet  Commonly known as:  IMDUR  Take 30  mg by mouth at bedtime.     levothyroxine 175 MCG tablet  Commonly known as:  SYNTHROID, LEVOTHROID  Take 175 mcg by mouth daily before breakfast.     multivitamin tablet  Take 1 tablet by mouth daily.     PROCEL Powd  Take 2 scoop by mouth 2 (two) times daily.     tamsulosin 0.4 MG Caps capsule  Commonly known as:  FLOMAX  Take 0.4 mg by mouth every morning.     VITAMIN E PO  Take 1 capsule by mouth daily. 400 mg capsule for supplement        Immunizations: Immunization History  Administered Date(s) Administered  . Influenza,inj,Quad PF,36+ Mos 11/19/2013  . Influenza-Unspecified 10/07/2014  . PPD Test 08/13/2014, 04/08/2015     Physical Exam: Filed Vitals:   04/15/15 1454  BP: 151/70  Pulse: 66  Temp: 98.1 F (36.7 C)  TempSrc: Oral  Resp: 16  Height: 5\' 8"  (1.727 m)  Weight: 160 lb 9.6 oz (72.848 kg)  SpO2: 92%   Body mass index is 24.42 kg/(m^2).  General- elderly female, well built, in no acute distress Head- normocephalic, atraumatic Nose- no maxillary or frontal sinus tenderness, no nasal discharge Throat- moist mucus membrane, upper and lower dentures Eyes- PERRLA, EOMI, no pallor, no icterus, no discharge, normal conjunctiva, normal sclera Neck-  no cervical lymphadenopathy Cardiovascular- normal s1,s2, no murmur, trace edema + Respiratory- bilateral clear to auscultation, no wheeze, no rhonchi, no crackles, no use of accessory muscles Abdomen- bowel sounds present, soft, non tender Musculoskeletal- able to move all 4 extremities, generalized weakness Neurological- alert and oriented to person, place and time, dysarthria +, left facial droop Skin- warm and dry, easy bruising, right foot dressing in place and it is clean and dry Psychiatry- normal mood and affect    Labs reviewed: Basic Metabolic Panel:  Recent Labs  10/21/14 1826  04/05/15 0419  04/07/15 0341 04/10/15 1007 04/10/15 1013  NA 135  < > 136  < > 136 139 138  K 3.8  < > 4.6  < > 4.6 4.2 4.2  CL 102  < > 101  --  102 99* 96*  CO2 23  < > 25  --  27 28  --   GLUCOSE 328*  < > 250*  --  243* 315* 298*  BUN 51*  < > 53*  < > 55* 40* 40*  CREATININE 1.85*  < > 1.72*  < > 1.82* 1.77* 1.70*  CALCIUM 8.3*  < > 8.6*  --  8.1* 9.1  --   MG 1.6*  --   --   --   --   --   --   PHOS 3.0  --   --   --   --   --   --   < > = values in this interval not displayed. Liver Function Tests:  Recent Labs  10/21/14 1826 12/03/14 1303 04/10/15 1007  AST 15 19 15   ALT 12* 12* 10*  ALKPHOS 94 67 55  BILITOT 0.6 0.5 0.2*  PROT 7.0 7.2 6.5  ALBUMIN 2.4* 3.2* 2.6*   CBC:  Recent Labs  08/08/14 1411  04/04/15 1915  04/07/15 0341 04/08/15 04/08/15 0345 04/10/15 1007 04/10/15 1013  WBC 11.5*  < > 10.2  < > 10.7* 9.2 9.2 8.8  --   NEUTROABS 8.3*  --  6.8  --   --   --   --  5.3  --  HGB 10.3*  < > 11.8*  < > 9.2* 9.1* 9.1* 10.1* 11.6*  HCT 31.8*  < > 36.1  < > 28.4* 28* 28.4* 32.8* 34.0*  MCV 81.3  < > 82.6  < > 81.6  --  81.1 85.6  --   PLT 386  < > 306  < > 296 308 308 348  --   < > = values in this interval not displayed. Cardiac Enzymes:  Recent Labs  10/22/14 0513 10/22/14 1317 10/22/14 1818  TROPONINI 0.09* 0.04* 0.03   BNP: Invalid input(s):  POCBNP CBG:  Recent Labs  04/12/15 0652 04/12/15 1125 04/12/15 1628  GLUCAP 112* 232* 238*    Radiological Exams: Ct Head Wo Contrast  04/10/2015  CLINICAL DATA:  Slurred speech EXAM: CT HEAD WITHOUT CONTRAST TECHNIQUE: Contiguous axial images were obtained from the base of the skull through the vertex without intravenous contrast. COMPARISON:  08/27/2013 FINDINGS: The bony calvarium is intact. No gross soft tissue abnormality is noted. Diffuse atrophic changes are noted. Areas of chronic white matter ischemic change are seen. No findings to suggest acute hemorrhage, acute infarction or space-occupying mass lesion are noted. IMPRESSION: Chronic atrophic and ischemic changes.  No acute abnormality noted. These results were called by telephone at the time of interpretation on 04/10/2015 at 10:38 am to Dr. Silverio Decamp, who verbally acknowledged these results. Electronically Signed   By: Inez Catalina M.D.   On: 04/10/2015 10:38   Mr Virgel Paling Wo Contrast  04/10/2015  CLINICAL DATA:  83 year old diabetic hypertensive female with prior stroke presenting with near syncopal episode and slurred speech. Subsequent encounter. EXAM: MRI HEAD WITHOUT CONTRAST MRA HEAD WITHOUT CONTRAST TECHNIQUE: Multiplanar, multiecho pulse sequences of the brain and surrounding structures were obtained without intravenous contrast. Angiographic images of the head were obtained using MRA technique without contrast. COMPARISON:  04/10/2015 CT.  02/04/2012 MR and MR angiogram. FINDINGS: MRI HEAD FINDINGS No acute infarct or intracranial hemorrhage. Moderate global atrophy without hydrocephalus. Mild to moderate chronic small vessel disease changes. No intracranial mass lesion noted on this unenhanced exam. Partial opacification left mastoid air cells without obstructing lesion of eustachian tube. Post lens replacement otherwise orbital structures unremarkable. Cervical spondylotic changes with spinal stenosis and cord flattening most  notable C2-3 followed by the C3-4 and C4-5 level. MRA HEAD FINDINGS Anterior circulation without medium or large size vessel significant stenosis or occlusion. Moderate moderate narrowing of portions of the middle cerebral artery branches and A2 segment anterior cerebral artery bilaterally. No high-grade stenosis distal vertebral arteries or basilar artery. Moderate posterior circulation branch vessel narrowing and irregularity including high-grade stenosis proximal and distal aspect of the posterior cerebral arteries more notable on the right. No aneurysm noted. IMPRESSION: MRI HEAD No acute infarct or intracranial hemorrhage. Moderate global atrophy without hydrocephalus. Mild to moderate chronic small vessel disease changes. No intracranial mass lesion noted on this unenhanced exam. Partial opacification left mastoid air cells. Cervical spondylotic changes with spinal stenosis and cord flattening most notable C2-3 followed by the C3-4 and C4-5 level. MRA HEAD Anterior circulation without medium or large size vessel significant stenosis or occlusion. Moderate moderate narrowing of portions of the middle cerebral artery branches and A2 segment anterior cerebral artery bilaterally. No high-grade stenosis distal vertebral arteries or basilar artery. Moderate posterior circulation branch vessel narrowing and irregularity. High-grade stenosis proximal and distal aspect of the posterior cerebral arteries more notable on the right. Electronically Signed   By: Genia Del M.D.   On: 04/10/2015  14:52   Mr Herby Abraham Contrast  04/10/2015  CLINICAL DATA:  82 year old diabetic hypertensive female with prior stroke presenting with near syncopal episode and slurred speech. Subsequent encounter. EXAM: MRI HEAD WITHOUT CONTRAST MRA HEAD WITHOUT CONTRAST TECHNIQUE: Multiplanar, multiecho pulse sequences of the brain and surrounding structures were obtained without intravenous contrast. Angiographic images of the head were  obtained using MRA technique without contrast. COMPARISON:  04/10/2015 CT.  02/04/2012 MR and MR angiogram. FINDINGS: MRI HEAD FINDINGS No acute infarct or intracranial hemorrhage. Moderate global atrophy without hydrocephalus. Mild to moderate chronic small vessel disease changes. No intracranial mass lesion noted on this unenhanced exam. Partial opacification left mastoid air cells without obstructing lesion of eustachian tube. Post lens replacement otherwise orbital structures unremarkable. Cervical spondylotic changes with spinal stenosis and cord flattening most notable C2-3 followed by the C3-4 and C4-5 level. MRA HEAD FINDINGS Anterior circulation without medium or large size vessel significant stenosis or occlusion. Moderate moderate narrowing of portions of the middle cerebral artery branches and A2 segment anterior cerebral artery bilaterally. No high-grade stenosis distal vertebral arteries or basilar artery. Moderate posterior circulation branch vessel narrowing and irregularity including high-grade stenosis proximal and distal aspect of the posterior cerebral arteries more notable on the right. No aneurysm noted. IMPRESSION: MRI HEAD No acute infarct or intracranial hemorrhage. Moderate global atrophy without hydrocephalus. Mild to moderate chronic small vessel disease changes. No intracranial mass lesion noted on this unenhanced exam. Partial opacification left mastoid air cells. Cervical spondylotic changes with spinal stenosis and cord flattening most notable C2-3 followed by the C3-4 and C4-5 level. MRA HEAD Anterior circulation without medium or large size vessel significant stenosis or occlusion. Moderate moderate narrowing of portions of the middle cerebral artery branches and A2 segment anterior cerebral artery bilaterally. No high-grade stenosis distal vertebral arteries or basilar artery. Moderate posterior circulation branch vessel narrowing and irregularity. High-grade stenosis proximal and  distal aspect of the posterior cerebral arteries more notable on the right. Electronically Signed   By: Genia Del M.D.   On: 04/10/2015 14:52   Dg Chest Port 1 View  04/10/2015  CLINICAL DATA:  Chronic nonproductive cough EXAM: PORTABLE CHEST 1 VIEW COMPARISON:  October 24, 2014 FINDINGS: There is no edema or consolidation. The heart size and pulmonary vascularity are normal. No adenopathy. No bone lesions. IMPRESSION: No edema or consolidation. Electronically Signed   By: Lowella Grip III M.D.   On: 04/10/2015 10:52   Dg Foot Complete Right  04/04/2015  CLINICAL DATA:  Diabetic foot wound. EXAM: RIGHT FOOT COMPLETE - 3+ VIEW COMPARISON:  08/08/2014 FINDINGS: Interval amputation of the great toe at the level of the distal metatarsal. There is erosive change of the medial cortex of the mid and proximal first metatarsal suggesting osteomyelitis. There is sclerotic periosteal reaction in this area consistent with chronic bony infection. Chronic fracture of the third metatarsal is unchanged. Prior amputation of the fourth toe is unchanged without evidence of osteomyelitis. IMPRESSION: Findings compatible with chronic osteomyelitis of the first metatarsal. Electronically Signed   By: Franchot Gallo M.D.   On: 04/04/2015 18:24   Ct Head Wo Contrast 04/10/2015    Chronic atrophic and ischemic changes.  No acute abnormality noted.   Mr [name] Head Wo Contrast 04/10/2015     Carotid ultrasound - Bilateral: 1-39% ICA stenosis. Vertebral artery flow is antegrade.  TTE - Left ventricle: The cavity size was normal. There was mild focal   basal hypertrophy of the septum. Systolic function  was vigorous.   The estimated ejection fraction was in the range of 65% to 70%.   Wall motion was normal; there were no regional wall motion   abnormalities. Doppler parameters are consistent with abnormal   left ventricular relaxation (grade 1 diastolic dysfunction).   Doppler parameters are consistent with high  ventricular filling   pressure. - Mitral valve: Calcified annulus. - Pulmonary arteries: Systolic pressure was mildly increased. Impressions: - Vigorous LV systolic function; grade 1 diastolic dysfunction with   elevated LV filling pressure; mild TR with mildly elevated   pulmonary pressure.    Assessment/Plan  Physical deconditioning Will have her work with physical therapy and occupational therapy team to help with gait training and muscle strengthening exercises.fall precautions. Skin care. Encourage to be out of bed.   Right ankle osteomyelitis S/p right foot metatarsal amputation. Continue tylenol 325 mg 1-2 tab q4h prn pain and monitor. To work with therapy team. Fall precautions. Has f/u with orthopedics.   CVA Continue aspirin 325 mg daily. Continue BP meds and monitor bp readings  Protein calorie malnutrition Continue protein supplement. Monitor clinically  ckd stage 4 Monitor renal function  DM uncontrolled Lab Results  Component Value Date   HGBA1C 11.9* 04/11/2015   Continue lantus 35 u with SSI novolog, monitor cbg.   Anemia of chronic disease Monitor cbc  AFib Rate controlled. S/p ablation. Continue cardizem and coreg. Not on anticoagulation with high fall risk and her age. Continue aspirin.  Neuropathic pain  Controlled. Continue neurontin 300 mg tid and monitor  Chronic diastolic chf Appears euvolemic on exam. Continue coreg, imdur and monitor bp  HTN Monitor bp, continue coreg, imdur and monitor bp  Hypothyroidism Continue her synthroid and monitor    Goals of care: short term rehabilitation   Labs/tests ordered: cbc, bmp  Family/ staff Communication: reviewed care plan with patient and nursing supervisor    Blanchie Serve, MD Internal Medicine [address], [place] [address] Cell Phone (Monday-Friday 8 am - 5 pm): [phone] On Call: [phone] and follow prompts after 5  pm and on weekends Office Phone: [phone] Office Fax: [phone]

## 2015-04-18 ENCOUNTER — Encounter: Payer: Self-pay | Admitting: Adult Health

## 2015-04-18 ENCOUNTER — Non-Acute Institutional Stay: Payer: Medicare Other | Admitting: Adult Health

## 2015-04-18 DIAGNOSIS — L03115 Cellulitis of right lower limb: Secondary | ICD-10-CM

## 2015-04-18 NOTE — Progress Notes (Signed)
Patient ID: [name], female   DOB: 06/16/1919, 82 y.o.   MRN: [id number]    DATE:  04/18/15  MRN:  [id number]  BIRTHDAY: 09-Feb-1919  Facility:  Nursing Home Location:  [place] Room Number: 208-P  LEVEL OF CARE:  SNF (31)  Contact Information    Name Relation Home Work [place] Daughter [phone]  [phone]       Code Status History    Date Active Date Inactive Code Status Order ID Comments User Context   04/10/2015  6:53 PM 04/12/2015 11:07 PM Full Code [id number]  Samella Parr, NP Inpatient   04/04/2015  9:56 PM 04/08/2015  6:23 PM Full Code [id number]  Norval Morton, MD ED   10/22/2014 11:27 AM 10/25/2014  8:06 PM DNR [id number]  Thurnell Lose, MD Inpatient   10/21/2014  5:31 PM 10/22/2014 11:27 AM Full Code [id number]  Robbie Lis, MD Inpatient   08/08/2014  5:20 PM 08/13/2014  6:08 PM Full Code [id number]  Theodis Blaze, MD Inpatient   11/18/2013 12:57 PM 11/21/2013  4:33 PM Full Code [id number]  Barton Dubois, MD Inpatient   08/28/2013 12:55 AM 08/30/2013  7:16 PM Full Code [id number]  Hosie Poisson, MD Inpatient   04/14/2012  7:40 PM 04/17/2012  5:41 PM Full Code [id number]  Newt Minion, MD Inpatient   04/12/2012 12:18 AM 04/14/2012  7:40 PM Full Code [id number]  Janell Quiet, MD Inpatient   04/21/2011  9:40 PM 04/27/2011  7:19 PM Full Code [id number]  Janae Bridgeman, RN Inpatient       Chief Complaint  Patient presents with  . Acute Visit    Cellulitis right foot    HISTORY OF PRESENT ILLNESS:  This is a 82 year old female who was noted to have erythema of right foot surgical incisions. Sutures are intact. Right foot is noted to be warmer than the left foot. Right foot has trace edema.  She has been re-admitted to [facility] on 04/12/15 from [facility]. She was having a short-term rehabilitation @ [facility] due to a recent right foot metatarsal amputation. She was noted to have slurred speech and was sent to the  hospital. She did not receive IV tPA due to recent surgery. She has a history of chronic stroke affecting her right side with baseline right arm and leg weakness and right upper extremity incoordination. She has chronic numbness in the lower extremity. She, also, has history of atrial flutter/fibrillation but not on anticoagulation due to fall risk but on ASA 325 mg daily.  MRI shows no acute infarct. MRA shows high-grade stenosis proximal and distal aspect of the PCAs.  She has been re-admitted for a short-term rehabilitation.   PAST MEDICAL HISTORY:  Past Medical History  Diagnosis Date  . Myocardial infarction ([place])   . Coronary artery disease   . Hypertension   . Dysrhythmia   . Peripheral vascular disease ([place])   . Hypothyroidism   . Type 2 diabetes mellitus with diabetic polyneuropathy, with long-term current use of insulin ([place])   . Presbyesophagus 04/23/2011  . Urinary retention with incomplete bladder emptying 04/23/2011  . UTI (lower urinary tract infection) 04/21/2011  . Diastolic CHF, chronic (H[facility])     Grade I diastolic dysfunction on Echo 04/26/11  . Anaphylaxis 04/25/2011    Thought to be from Rocephin  . History of atrial flutter     ablation  . C. difficile colitis 04/17/2012  .  Atrial flutter ([place]) 11/01/2012  . HCAP (healthcare-associated pneumonia) 10/21/2014  . Stroke [facility]) 2003    "lost the use of all of my right side"  . HOH (hard of hearing)   . CKD (chronic kidney disease), stage IV ([place])   . Expressive aphasia   . Hyperkalemia   . Hyponatremia   . Physical deconditioning   . Cellulitis of right foot   . Chronic osteomyelitis ([place])   . Chronic diastolic CHF (congestive heart failure) ([place])   . Neuropathy ([place])   . Acute blood loss anemia      CURRENT MEDICATIONS: Reviewed  Patient's Medications  New Prescriptions   No medications on file  Previous Medications   ACETAMINOPHEN (TYLENOL) 325 MG TABLET    Take 325-650 mg by mouth every 6 (six) hours as  needed for mild pain, moderate pain, fever or headache.   ASPIRIN 325 MG TABLET    Take 325 mg by mouth daily. Take for 30 days starting 3/15, then dropping down to 81mg  daily.   ASPIRIN 81 MG CHEWABLE TABLET    Chew 81 mg by mouth daily.   CARVEDILOL (COREG) 6.25 MG TABLET    Take 6.25 mg by mouth 2 (two) times daily with a meal. Reported on 04/10/2015   DILTIAZEM (CARDIZEM CD) 180 MG 24 HR CAPSULE    Take 180 mg by mouth at bedtime.    DOXYCYCLINE (VIBRA-TABS) 100 MG TABLET    Take 100 mg by mouth 2 (two) times daily. Take for 2 weeks for right foot cellulitis   GABAPENTIN (NEURONTIN) 300 MG CAPSULE    Take 300 mg by mouth 3 (three) times daily.    INSULIN ASPART (NOVOLOG) 100 UNIT/ML INJECTION    Inject 2-12 Units into the skin 3 (three) times daily before meals. Give no units for BS 0-69 - hypoglycemic protocol & notify MD;  70-140 (0 units),141-180 (2 units), 181-220(4 units), 221-260 (6 units), 261-300(8 units), 301-340(10 units), >340 (12 units) notify MD   INSULIN GLARGINE (LANTUS) 100 UNIT/ML INJECTION    Inject 0.35 mLs (35 Units total) into the skin every morning.   ISOSORBIDE MONONITRATE (IMDUR) 30 MG 24 HR TABLET    Take 30 mg by mouth at bedtime.    LEVOTHYROXINE (SYNTHROID, LEVOTHROID) 175 MCG TABLET    Take 175 mcg by mouth daily before breakfast.   MULTIPLE VITAMIN (MULTIVITAMIN) TABLET    Take 1 tablet by mouth daily.   PROTEIN (PROCEL) POWD    Take 2 scoop by mouth 2 (two) times daily.   SACCHAROMYCES BOULARDII (FLORASTOR) 250 MG CAPSULE    Take 250 mg by mouth 2 (two) times daily. Take for 13 days   TAMSULOSIN HCL (FLOMAX) 0.4 MG CAPS    Take 0.4 mg by mouth every morning.    VITAMIN E PO    Take 1 capsule by mouth daily. 400 mg capsule for supplement  Modified Medications   No medications on file  Discontinued Medications   No medications on file     Allergies  Allergen Reactions  . Corn-Containing Products   . Ephedrine     Unknown.    . Morphine Other (See Comments)     Difficulty waking up & talking after surgery  . Rocephin [Ceftriaxone]      REVIEW OF SYSTEMS:  GENERAL: no change in appetite, no fatigue, no weight changes, no fever, chills or weakness EYES: Denies change in vision, dry eyes, eye pain, itching or discharge EARS: Denies change in hearing, ringing  in ears, or earache NOSE: Denies nasal congestion or epistaxis MOUTH and THROAT: Denies oral discomfort, gingival pain or bleeding, pain from teeth or hoarseness   RESPIRATORY: no cough, SOB, DOE, wheezing, hemoptysis CARDIAC: no chest pain, or palpitations GI: no abdominal pain, diarrhea, constipation, heart burn, nausea or vomiting GU: Denies dysuria, frequency, hematuria, incontinence, or discharge PSYCHIATRIC: Denies feeling of depression or anxiety. No report of hallucinations, insomnia, paranoia, or agitation   PHYSICAL EXAMINATION  GENERAL APPEARANCE: Well nourished. In no acute distress. Normal body habitus SKIN:  Right foot surgical incision is erythematous and warm to touch HEAD: Normal in size and contour. No evidence of trauma EYES: Lids open and close normally. No blepharitis, entropion or ectropion. PERRL. Conjunctivae are clear and sclerae are white. Lenses are without opacity EARS: Pinnae are normal. Patient hears normal voice tunes of the examiner MOUTH and THROAT: Lips are without lesions. Oral mucosa is moist and without lesions. Tongue is normal in shape, size, and color and without lesions NECK: supple, trachea midline, no neck masses, no thyroid tenderness, no thyromegaly LYMPHATICS: no LAN in the neck, no supraclavicular LAN RESPIRATORY: breathing is even & unlabored, BS CTAB CARDIAC: Irregularly irregular, no murmur,no extra heart sounds, RLE trace edema GI: abdomen soft, normal BS, no masses, no tenderness, no hepatomegaly, no splenomegaly EXTREMITIES:  Able to move X 4 extremities, generalized weakness PSYCHIATRIC: Alert and oriented X 3. Affect and behavior  are appropriate  LABS/RADIOLOGY: Labs reviewed: Basic Metabolic Panel:  Recent Labs  10/21/14 1826  04/05/15 0419  04/07/15 0341  04/10/15 1007 04/10/15 1013 04/15/15  NA 135  < > 136  < > 136  < > 139 138 140  K 3.8  < > 4.6  < > 4.6  < > 4.2 4.2 4.9  CL 102  < > 101  --  102  --  99* 96*  --   CO2 23  < > 25  --  27  --  28  --   --   GLUCOSE 328*  < > 250*  --  243*  --  315* 298*  --   BUN 51*  < > 53*  < > 55*  < > 40* 40* 32*  CREATININE 1.85*  < > 1.72*  < > 1.82*  < > 1.77* 1.70* 1.6*  CALCIUM 8.3*  < > 8.6*  --  8.1*  --  9.1  --   --   MG 1.6*  --   --   --   --   --   --   --   --   PHOS 3.0  --   --   --   --   --   --   --   --   < > = values in this interval not displayed. Liver Function Tests:  Recent Labs  10/21/14 1826 12/03/14 1303 04/10/15 1007  AST 15 19 15   ALT 12* 12* 10*  ALKPHOS 94 67 55  BILITOT 0.6 0.5 0.2*  PROT 7.0 7.2 6.5  ALBUMIN 2.4* 3.2* 2.6*   CBC:  Recent Labs  04/07/15 0341  04/08/15 0345 04/10/15 04/10/15 1007 04/10/15 1013 04/15/15  WBC 10.7*  < > 9.2 8.7 8.8  --  9.7  NEUTROABS  --   --   --  5 5.3  --  6  HGB 9.2*  < > 9.1* 9.7* 10.1* 11.6* 10.2*  HCT 28.4*  < > 28.4* 32* 32.8* 34.0* 33*  MCV 81.6  --  81.1  --  85.6  --   --   PLT 296  < > 308 393 348  --  407*  < > = values in this interval not displayed.  Lipid Panel:  Recent Labs  04/11/15 0615  HDL 37*   Cardiac Enzymes:  Recent Labs  10/22/14 0513 10/22/14 1317 10/22/14 1818  TROPONINI 0.09* 0.04* 0.03   CBG:  Recent Labs  04/12/15 0652 04/12/15 1125 04/12/15 1628  GLUCAP 112* 232* 238*     Ct Head Wo Contrast  04/10/2015  CLINICAL DATA:  Slurred speech EXAM: CT HEAD WITHOUT CONTRAST TECHNIQUE: Contiguous axial images were obtained from the base of the skull through the vertex without intravenous contrast. COMPARISON:  08/27/2013 FINDINGS: The bony calvarium is intact. No gross soft tissue abnormality is noted. Diffuse atrophic changes are  noted. Areas of chronic white matter ischemic change are seen. No findings to suggest acute hemorrhage, acute infarction or space-occupying mass lesion are noted. IMPRESSION: Chronic atrophic and ischemic changes.  No acute abnormality noted. These results were called by telephone at the time of interpretation on 04/10/2015 at 10:38 am to Dr. Silverio Decamp, who verbally acknowledged these results. Electronically Signed   By: Inez Catalina M.D.   On: 04/10/2015 10:38   Mr Virgel Paling Wo Contrast  04/10/2015  CLINICAL DATA:  82 year old diabetic hypertensive female with prior stroke presenting with near syncopal episode and slurred speech. Subsequent encounter. EXAM: MRI HEAD WITHOUT CONTRAST MRA HEAD WITHOUT CONTRAST TECHNIQUE: Multiplanar, multiecho pulse sequences of the brain and surrounding structures were obtained without intravenous contrast. Angiographic images of the head were obtained using MRA technique without contrast. COMPARISON:  04/10/2015 CT.  02/04/2012 MR and MR angiogram. FINDINGS: MRI HEAD FINDINGS No acute infarct or intracranial hemorrhage. Moderate global atrophy without hydrocephalus. Mild to moderate chronic small vessel disease changes. No intracranial mass lesion noted on this unenhanced exam. Partial opacification left mastoid air cells without obstructing lesion of eustachian tube. Post lens replacement otherwise orbital structures unremarkable. Cervical spondylotic changes with spinal stenosis and cord flattening most notable C2-3 followed by the C3-4 and C4-5 level. MRA HEAD FINDINGS Anterior circulation without medium or large size vessel significant stenosis or occlusion. Moderate moderate narrowing of portions of the middle cerebral artery branches and A2 segment anterior cerebral artery bilaterally. No high-grade stenosis distal vertebral arteries or basilar artery. Moderate posterior circulation branch vessel narrowing and irregularity including high-grade stenosis proximal and distal  aspect of the posterior cerebral arteries more notable on the right. No aneurysm noted. IMPRESSION: MRI HEAD No acute infarct or intracranial hemorrhage. Moderate global atrophy without hydrocephalus. Mild to moderate chronic small vessel disease changes. No intracranial mass lesion noted on this unenhanced exam. Partial opacification left mastoid air cells. Cervical spondylotic changes with spinal stenosis and cord flattening most notable C2-3 followed by the C3-4 and C4-5 level. MRA HEAD Anterior circulation without medium or large size vessel significant stenosis or occlusion. Moderate moderate narrowing of portions of the middle cerebral artery branches and A2 segment anterior cerebral artery bilaterally. No high-grade stenosis distal vertebral arteries or basilar artery. Moderate posterior circulation branch vessel narrowing and irregularity. High-grade stenosis proximal and distal aspect of the posterior cerebral arteries more notable on the right. Electronically Signed   By: Genia Del M.D.   On: 04/10/2015 14:52   Mr Brain Wo Contrast  04/10/2015  CLINICAL DATA:  82 year old diabetic hypertensive female with prior stroke presenting with near syncopal episode and slurred speech. Subsequent encounter. EXAM: MRI  HEAD WITHOUT CONTRAST MRA HEAD WITHOUT CONTRAST TECHNIQUE: Multiplanar, multiecho pulse sequences of the brain and surrounding structures were obtained without intravenous contrast. Angiographic images of the head were obtained using MRA technique without contrast. COMPARISON:  04/10/2015 CT.  02/04/2012 MR and MR angiogram. FINDINGS: MRI HEAD FINDINGS No acute infarct or intracranial hemorrhage. Moderate global atrophy without hydrocephalus. Mild to moderate chronic small vessel disease changes. No intracranial mass lesion noted on this unenhanced exam. Partial opacification left mastoid air cells without obstructing lesion of eustachian tube. Post lens replacement otherwise orbital structures  unremarkable. Cervical spondylotic changes with spinal stenosis and cord flattening most notable C2-3 followed by the C3-4 and C4-5 level. MRA HEAD FINDINGS Anterior circulation without medium or large size vessel significant stenosis or occlusion. Moderate moderate narrowing of portions of the middle cerebral artery branches and A2 segment anterior cerebral artery bilaterally. No high-grade stenosis distal vertebral arteries or basilar artery. Moderate posterior circulation branch vessel narrowing and irregularity including high-grade stenosis proximal and distal aspect of the posterior cerebral arteries more notable on the right. No aneurysm noted. IMPRESSION: MRI HEAD No acute infarct or intracranial hemorrhage. Moderate global atrophy without hydrocephalus. Mild to moderate chronic small vessel disease changes. No intracranial mass lesion noted on this unenhanced exam. Partial opacification left mastoid air cells. Cervical spondylotic changes with spinal stenosis and cord flattening most notable C2-3 followed by the C3-4 and C4-5 level. MRA HEAD Anterior circulation without medium or large size vessel significant stenosis or occlusion. Moderate moderate narrowing of portions of the middle cerebral artery branches and A2 segment anterior cerebral artery bilaterally. No high-grade stenosis distal vertebral arteries or basilar artery. Moderate posterior circulation branch vessel narrowing and irregularity. High-grade stenosis proximal and distal aspect of the posterior cerebral arteries more notable on the right. Electronically Signed   By: Genia Del M.D.   On: 04/10/2015 14:52   Dg Chest Port 1 View  04/10/2015  CLINICAL DATA:  Chronic nonproductive cough EXAM: PORTABLE CHEST 1 VIEW COMPARISON:  October 24, 2014 FINDINGS: There is no edema or consolidation. The heart size and pulmonary vascularity are normal. No adenopathy. No bone lesions. IMPRESSION: No edema or consolidation. Electronically Signed    By: Lowella Grip III M.D.   On: 04/10/2015 10:52   Dg Foot Complete Right  04/04/2015  CLINICAL DATA:  Diabetic foot wound. EXAM: RIGHT FOOT COMPLETE - 3+ VIEW COMPARISON:  08/08/2014 FINDINGS: Interval amputation of the great toe at the level of the distal metatarsal. There is erosive change of the medial cortex of the mid and proximal first metatarsal suggesting osteomyelitis. There is sclerotic periosteal reaction in this area consistent with chronic bony infection. Chronic fracture of the third metatarsal is unchanged. Prior amputation of the fourth toe is unchanged without evidence of osteomyelitis. IMPRESSION: Findings compatible with chronic osteomyelitis of the first metatarsal. Electronically Signed   By: Franchot Gallo M.D.   On: 04/04/2015 18:24    ASSESSMENT/PLAN:   Cellulitis of right foot - start Doxycycline 100 mg 1 PO BID X 2 weeks and Florastor 250 mg 1 capsule PO BID X 17 days     [facility], NP [place]

## 2015-04-28 ENCOUNTER — Encounter: Payer: Self-pay | Admitting: Adult Health

## 2015-04-28 ENCOUNTER — Other Ambulatory Visit: Payer: Self-pay | Admitting: Internal Medicine

## 2015-04-28 ENCOUNTER — Non-Acute Institutional Stay (SKILLED_NURSING_FACILITY): Payer: Medicare Other | Admitting: Adult Health

## 2015-04-28 DIAGNOSIS — L089 Local infection of the skin and subcutaneous tissue, unspecified: Secondary | ICD-10-CM

## 2015-04-28 DIAGNOSIS — L03115 Cellulitis of right lower limb: Secondary | ICD-10-CM

## 2015-04-28 DIAGNOSIS — T148XXA Other injury of unspecified body region, initial encounter: Principal | ICD-10-CM

## 2015-04-28 NOTE — Progress Notes (Signed)
Patient ID: [name], female   DOB: 1919-12-19, 82 y.o.   MRN: [id number]    DATE:    04/28/15  MRN:  [id number]  BIRTHDAY: Aug 02, 1919  Facility:  Nursing Home Location:  [place] [facility] [place] Room Number: 208-P  LEVEL OF CARE:  SNF (31)  Contact Information    Name Relation Home Work [place] Daughter [phone]  [phone]       Code Status History    Date Active Date Inactive Code Status Order ID Comments User Context   04/10/2015  6:53 PM 04/12/2015 11:07 PM Full Code [id number]  Samella Parr, NP Inpatient   04/04/2015  9:56 PM 04/08/2015  6:23 PM Full Code [id number]  Norval Morton, MD ED   10/22/2014 11:27 AM 10/25/2014  8:06 PM DNR [id number]  Thurnell Lose, MD Inpatient   10/21/2014  5:31 PM 10/22/2014 11:27 AM Full Code [id number]  Robbie Lis, MD Inpatient   08/08/2014  5:20 PM 08/13/2014  6:08 PM Full Code [id number]  Theodis Blaze, MD Inpatient   11/18/2013 12:57 PM 11/21/2013  4:33 PM Full Code [id number]  Barton Dubois, MD Inpatient   08/28/2013 12:55 AM 08/30/2013  7:16 PM Full Code [id number]  Hosie Poisson, MD Inpatient   04/14/2012  7:40 PM 04/17/2012  5:41 PM Full Code [id number]  Newt Minion, MD Inpatient   04/12/2012 12:18 AM 04/14/2012  7:40 PM Full Code [id number]  Janell Quiet, MD Inpatient   04/21/2011  9:40 PM 04/27/2011  7:19 PM Full Code [id number]  Janae Bridgeman, RN Inpatient       Chief Complaint  Patient presents with  . Acute Visit    Right foot cellulitis, MRSA    HISTORY OF PRESENT ILLNESS:  This is a 82 year old female who was recently started on Doxycycline for right foot cellulitis. Before starting antibiotics, wound nurse cultured wound drainage. Culture showed heavy growth of staphylococcus aureus, MRSA and gram stain showed rare white blood cells, few gram positive cocci.  She has been re-admitted to [facility] on 04/12/15 from [facility]. She was having a short-term rehabilitation @ [facility]  due to a recent right foot metatarsal amputation. She was noted to have slurred speech and was sent to the hospital. She did not receive IV tPA due to recent surgery. She has a history of chronic stroke affecting her right side with baseline right arm and leg weakness and right upper extremity incoordination. She has chronic numbness in the lower extremity. She, also, has history of atrial flutter/fibrillation but not on anticoagulation due to fall risk but on ASA 325 mg daily.  MRI shows no acute infarct. MRA shows high-grade stenosis proximal and distal aspect of the PCAs.  She has been re-admitted for a short-term rehabilitation.   PAST MEDICAL HISTORY:  Past Medical History  Diagnosis Date  . Myocardial infarction ([place])   . Coronary artery disease   . Hypertension   . Dysrhythmia   . Peripheral vascular disease ([place])   . Hypothyroidism   . Type 2 diabetes mellitus with diabetic polyneuropathy, with long-term current use of insulin ([place])   . Presbyesophagus 04/23/2011  . Urinary retention with incomplete bladder emptying 04/23/2011  . UTI (lower urinary tract infection) 04/21/2011  . Diastolic CHF, chronic (HCC)     Grade I diastolic dysfunction on Echo 04/26/11  . Anaphylaxis 04/25/2011    Thought to be from Rocephin  . History of atrial flutter  ablation  . C. difficile colitis 04/17/2012  . Atrial flutter ([place]) 11/01/2012  . HCAP (healthcare-associated pneumonia) 10/21/2014  . Stroke [facility]) 2003    "lost the use of all of my right side"  . HOH (hard of hearing)   . CKD (chronic kidney disease), stage IV ([place])   . Expressive aphasia   . Hyperkalemia   . Hyponatremia   . Physical deconditioning   . Cellulitis of right foot   . Chronic osteomyelitis ([place])   . Chronic diastolic CHF (congestive heart failure) ([place])   . Neuropathy ([place])   . Acute blood loss anemia   . Cellulitis of right foot      CURRENT MEDICATIONS: Reviewed  Patient's Medications  New Prescriptions   No  medications on file  Previous Medications   ACETAMINOPHEN (TYLENOL) 325 MG TABLET    Take 325-650 mg by mouth every 6 (six) hours as needed for mild pain, moderate pain, fever or headache.   ASPIRIN 325 MG TABLET    Take 325 mg by mouth daily. Take for 30 days starting 3/15, then dropping down to 81mg  daily.   ASPIRIN 81 MG CHEWABLE TABLET    Chew 81 mg by mouth daily.   CARVEDILOL (COREG) 6.25 MG TABLET    Take 6.25 mg by mouth 2 (two) times daily with a meal. Reported on 04/10/2015   DILTIAZEM (CARDIZEM CD) 180 MG 24 HR CAPSULE    Take 180 mg by mouth at bedtime.    GABAPENTIN (NEURONTIN) 300 MG CAPSULE    Take 300 mg by mouth 3 (three) times daily.    INSULIN ASPART (NOVOLOG) 100 UNIT/ML INJECTION    Inject 2-12 Units into the skin 3 (three) times daily before meals. Give no units for BS 0-69 - hypoglycemic protocol & notify MD;  70-140 (0 units),141-180 (2 units), 181-220(4 units), 221-260 (6 units), 261-300(8 units), 301-340(10 units), >340 (12 units) notify MD   INSULIN GLARGINE (LANTUS) 100 UNIT/ML INJECTION    Inject 0.35 mLs (35 Units total) into the skin every morning.   ISOSORBIDE MONONITRATE (IMDUR) 30 MG 24 HR TABLET    Take 30 mg by mouth at bedtime.    LEVOTHYROXINE (SYNTHROID, LEVOTHROID) 175 MCG TABLET    Take 175 mcg by mouth daily before breakfast.   MULTIPLE VITAMIN (MULTIVITAMIN) TABLET    Take 1 tablet by mouth daily.   PROTEIN (PROCEL) POWD    Take 2 scoop by mouth 2 (two) times daily.   SACCHAROMYCES BOULARDII (FLORASTOR) 250 MG CAPSULE    Take 250 mg by mouth 2 (two) times daily. Take for 17 days   SODIUM POLYSTYRENE (KAYEXALATE) 15 GM/60ML SUSPENSION    Take 15 g by mouth once.   TAMSULOSIN HCL (FLOMAX) 0.4 MG CAPS    Take 0.4 mg by mouth every morning.    TIGECYCLINE IV    Inject 50 mg into the vein every 12 (twelve) hours. Give for 10 days via PICC line.   VITAMIN E PO    Take 1 capsule by mouth daily. 400 mg capsule for supplement  Modified Medications   No medications  on file  Discontinued Medications   DOXYCYCLINE (VIBRA-TABS) 100 MG TABLET    Take 100 mg by mouth 2 (two) times daily. Take for 2 weeks for right foot cellulitis     Allergies  Allergen Reactions  . Corn-Containing Products   . Ephedrine     Unknown.    . Morphine Other (See Comments)    Difficulty waking  up & talking after surgery  . Rocephin [Ceftriaxone]      REVIEW OF SYSTEMS:  GENERAL: no change in appetite, no fatigue, no weight changes, no fever, chills or weakness EYES: Denies change in vision, dry eyes, eye pain, itching or discharge EARS: Denies change in hearing, ringing in ears, or earache NOSE: Denies nasal congestion or epistaxis MOUTH and THROAT: Denies oral discomfort, gingival pain or bleeding, pain from teeth or hoarseness   RESPIRATORY: no cough, SOB, DOE, wheezing, hemoptysis CARDIAC: no chest pain, or palpitations GI: no abdominal pain, diarrhea, constipation, heart burn, nausea or vomiting GU: Denies dysuria, frequency, hematuria, incontinence, or discharge PSYCHIATRIC: Denies feeling of depression or anxiety. No report of hallucinations, insomnia, paranoia, or agitation   PHYSICAL EXAMINATION  GENERAL APPEARANCE: Well nourished. In no acute distress. Normal body habitus SKIN:  Right foot surgical incision is erythematous and warm to touch HEAD: Normal in size and contour. No evidence of trauma EYES: Lids open and close normally. No blepharitis, entropion or ectropion. PERRL. Conjunctivae are clear and sclerae are white. Lenses are without opacity EARS: Pinnae are normal. Patient hears normal voice tunes of the examiner MOUTH and THROAT: Lips are without lesions. Oral mucosa is moist and without lesions. Tongue is normal in shape, size, and color and without lesions NECK: supple, trachea midline, no neck masses, no thyroid tenderness, no thyromegaly LYMPHATICS: no LAN in the neck, no supraclavicular LAN RESPIRATORY: breathing is even & unlabored, BS  CTAB CARDIAC: Irregularly irregular, no murmur,no extra heart sounds, RLE trace edema GI: abdomen soft, normal BS, no masses, no tenderness, no hepatomegaly, no splenomegaly EXTREMITIES:  Able to move X 4 extremities, generalized weakness PSYCHIATRIC: Alert and oriented X 3. Affect and behavior are appropriate  LABS/RADIOLOGY: Labs reviewed: Basic Metabolic Panel:  Recent Labs  10/21/14 1826  04/05/15 0419  04/07/15 0341  04/10/15 1007 04/10/15 1013 04/15/15 04/29/15  NA 135  < > 136  < > 136  < > 139 138 140 141  K 3.8  < > 4.6  < > 4.6  < > 4.2 4.2 4.9 5.5*  CL 102  < > 101  --  102  --  99* 96*  --   --   CO2 23  < > 25  --  27  --  28  --   --   --   GLUCOSE 328*  < > 250*  --  243*  --  315* 298*  --   --   BUN 51*  < > 53*  < > 55*  < > 40* 40* 32* 45*  CREATININE 1.85*  < > 1.72*  < > 1.82*  < > 1.77* 1.70* 1.6* 1.8*  CALCIUM 8.3*  < > 8.6*  --  8.1*  --  9.1  --   --   --   MG 1.6*  --   --   --   --   --   --   --   --   --   PHOS 3.0  --   --   --   --   --   --   --   --   --   < > = values in this interval not displayed. Liver Function Tests:  Recent Labs  10/21/14 1826 12/03/14 1303 04/10/15 1007 04/29/15  AST 15 19 15  9*  ALT 12* 12* 10* 6*  ALKPHOS 94 67 55 71  BILITOT 0.6 0.5 0.2*  --   PROT  7.0 7.2 6.5  --   ALBUMIN 2.4* 3.2* 2.6*  --    CBC:  Recent Labs  04/07/15 0341  04/08/15 0345  04/10/15 1007 04/10/15 1013 04/15/15 04/29/15  WBC 10.7*  < > 9.2  < > 8.8  --  9.7 9.5  NEUTROABS  --   --   --   < > 5.3  --  6 5  HGB 9.2*  < > 9.1*  < > 10.1* 11.6* 10.2* 9.1*  HCT 28.4*  < > 28.4*  < > 32.8* 34.0* 33* 29*  MCV 81.6  --  81.1  --  85.6  --   --   --   PLT 296  < > 308  < > 348  --  407* 339  < > = values in this interval not displayed.  Lipid Panel:  Recent Labs  04/11/15 0615  HDL 37*   Cardiac Enzymes:  Recent Labs  10/22/14 0513 10/22/14 1317 10/22/14 1818  TROPONINI 0.09* 0.04* 0.03   CBG:  Recent Labs  04/12/15 0652  04/12/15 1125 04/12/15 1628  GLUCAP 112* 232* 238*     Ct Head Wo Contrast  04/10/2015  CLINICAL DATA:  Slurred speech EXAM: CT HEAD WITHOUT CONTRAST TECHNIQUE: Contiguous axial images were obtained from the base of the skull through the vertex without intravenous contrast. COMPARISON:  08/27/2013 FINDINGS: The bony calvarium is intact. No gross soft tissue abnormality is noted. Diffuse atrophic changes are noted. Areas of chronic white matter ischemic change are seen. No findings to suggest acute hemorrhage, acute infarction or space-occupying mass lesion are noted. IMPRESSION: Chronic atrophic and ischemic changes.  No acute abnormality noted. These results were called by telephone at the time of interpretation on 04/10/2015 at 10:38 am to Dr. Silverio Decamp, who verbally acknowledged these results. Electronically Signed   By: Inez Catalina M.D.   On: 04/10/2015 10:38   Mr Virgel Paling Wo Contrast  04/10/2015  CLINICAL DATA:  82 year old diabetic hypertensive female with prior stroke presenting with near syncopal episode and slurred speech. Subsequent encounter. EXAM: MRI HEAD WITHOUT CONTRAST MRA HEAD WITHOUT CONTRAST TECHNIQUE: Multiplanar, multiecho pulse sequences of the brain and surrounding structures were obtained without intravenous contrast. Angiographic images of the head were obtained using MRA technique without contrast. COMPARISON:  04/10/2015 CT.  02/04/2012 MR and MR angiogram. FINDINGS: MRI HEAD FINDINGS No acute infarct or intracranial hemorrhage. Moderate global atrophy without hydrocephalus. Mild to moderate chronic small vessel disease changes. No intracranial mass lesion noted on this unenhanced exam. Partial opacification left mastoid air cells without obstructing lesion of eustachian tube. Post lens replacement otherwise orbital structures unremarkable. Cervical spondylotic changes with spinal stenosis and cord flattening most notable C2-3 followed by the C3-4 and C4-5 level. MRA HEAD  FINDINGS Anterior circulation without medium or large size vessel significant stenosis or occlusion. Moderate moderate narrowing of portions of the middle cerebral artery branches and A2 segment anterior cerebral artery bilaterally. No high-grade stenosis distal vertebral arteries or basilar artery. Moderate posterior circulation branch vessel narrowing and irregularity including high-grade stenosis proximal and distal aspect of the posterior cerebral arteries more notable on the right. No aneurysm noted. IMPRESSION: MRI HEAD No acute infarct or intracranial hemorrhage. Moderate global atrophy without hydrocephalus. Mild to moderate chronic small vessel disease changes. No intracranial mass lesion noted on this unenhanced exam. Partial opacification left mastoid air cells. Cervical spondylotic changes with spinal stenosis and cord flattening most notable C2-3 followed by the C3-4 and C4-5 level. MRA  HEAD Anterior circulation without medium or large size vessel significant stenosis or occlusion. Moderate moderate narrowing of portions of the middle cerebral artery branches and A2 segment anterior cerebral artery bilaterally. No high-grade stenosis distal vertebral arteries or basilar artery. Moderate posterior circulation branch vessel narrowing and irregularity. High-grade stenosis proximal and distal aspect of the posterior cerebral arteries more notable on the right. Electronically Signed   By: Genia Del M.D.   On: 04/10/2015 14:52   Mr Brain Wo Contrast  04/10/2015  CLINICAL DATA:  82 year old diabetic hypertensive female with prior stroke presenting with near syncopal episode and slurred speech. Subsequent encounter. EXAM: MRI HEAD WITHOUT CONTRAST MRA HEAD WITHOUT CONTRAST TECHNIQUE: Multiplanar, multiecho pulse sequences of the brain and surrounding structures were obtained without intravenous contrast. Angiographic images of the head were obtained using MRA technique without contrast. COMPARISON:   04/10/2015 CT.  02/04/2012 MR and MR angiogram. FINDINGS: MRI HEAD FINDINGS No acute infarct or intracranial hemorrhage. Moderate global atrophy without hydrocephalus. Mild to moderate chronic small vessel disease changes. No intracranial mass lesion noted on this unenhanced exam. Partial opacification left mastoid air cells without obstructing lesion of eustachian tube. Post lens replacement otherwise orbital structures unremarkable. Cervical spondylotic changes with spinal stenosis and cord flattening most notable C2-3 followed by the C3-4 and C4-5 level. MRA HEAD FINDINGS Anterior circulation without medium or large size vessel significant stenosis or occlusion. Moderate moderate narrowing of portions of the middle cerebral artery branches and A2 segment anterior cerebral artery bilaterally. No high-grade stenosis distal vertebral arteries or basilar artery. Moderate posterior circulation branch vessel narrowing and irregularity including high-grade stenosis proximal and distal aspect of the posterior cerebral arteries more notable on the right. No aneurysm noted. IMPRESSION: MRI HEAD No acute infarct or intracranial hemorrhage. Moderate global atrophy without hydrocephalus. Mild to moderate chronic small vessel disease changes. No intracranial mass lesion noted on this unenhanced exam. Partial opacification left mastoid air cells. Cervical spondylotic changes with spinal stenosis and cord flattening most notable C2-3 followed by the C3-4 and C4-5 level. MRA HEAD Anterior circulation without medium or large size vessel significant stenosis or occlusion. Moderate moderate narrowing of portions of the middle cerebral artery branches and A2 segment anterior cerebral artery bilaterally. No high-grade stenosis distal vertebral arteries or basilar artery. Moderate posterior circulation branch vessel narrowing and irregularity. High-grade stenosis proximal and distal aspect of the posterior cerebral arteries more  notable on the right. Electronically Signed   By: Genia Del M.D.   On: 04/10/2015 14:52   Ir Fluoro Guide Cv Line Right  04/29/2015  INDICATION: 82 year old female with a history of foot infection and requirement of antibiotics. EXAM: PICC LINE PLACEMENT WITH ULTRASOUND AND FLUOROSCOPIC GUIDANCE MEDICATIONS: None ANESTHESIA/SEDATION: None FLUOROSCOPY TIME:  Fluoroscopy Time: 0 minutes 12 seconds (1 mGy). COMPLICATIONS: None PROCEDURE: The patient was advised of the possible risks and complications and agreed to undergo the procedure. The patient was then brought to the angiographic suite for the procedure. The right arm was prepped with chlorhexidine, draped in the usual sterile fashion using maximum barrier technique (cap and mask, sterile gown, sterile gloves, large sterile sheet, hand hygiene and cutaneous antisepsis) and infiltrated locally with 1% Lidocaine. Ultrasound demonstrated patency of the right basilic vein, and this was documented with an image. Under real-time ultrasound guidance, this vein was accessed with a 21 gauge micropuncture needle and image documentation was performed. A 0.018 wire was introduced in to the vein. Over this, a 5 [place] single lumen power injectable  PICC was advanced to the superior SVC/right atrial junction. Fluoroscopy during the procedure and fluoro spot radiograph confirms appropriate catheter position. The catheter was flushed and covered with asterile dressing. Catheter length: 40 cm IMPRESSION: Status post right basilic vein PICC placement. 40 cm single-lumen power injectable PICC placed. Catheter ready for use. Signed, Dulcy Fanny. Earleen Newport, DO [facility] Radiology Electronically Signed   By: Corrie Mckusick D.O.   On: 04/29/2015 15:05   Ir US Guide Vasc Access Right  04/29/2015  INDICATION: 82 year old female with a history of foot infection and requirement of antibiotics. EXAM: PICC LINE PLACEMENT WITH ULTRASOUND AND  FLUOROSCOPIC GUIDANCE MEDICATIONS: None ANESTHESIA/SEDATION: None FLUOROSCOPY TIME:  Fluoroscopy Time: 0 minutes 12 seconds (1 mGy). COMPLICATIONS: None PROCEDURE: The patient was advised of the possible risks and complications and agreed to undergo the procedure. The patient was then brought to the angiographic suite for the procedure. The right arm was prepped with chlorhexidine, draped in the usual sterile fashion using maximum barrier technique (cap and mask, sterile gown, sterile gloves, large sterile sheet, hand hygiene and cutaneous antisepsis) and infiltrated locally with 1% Lidocaine. Ultrasound demonstrated patency of the right basilic vein, and this was documented with an image. Under real-time ultrasound guidance, this vein was accessed with a 21 gauge micropuncture needle and image documentation was performed. A 0.018 wire was introduced in to the vein. Over this, a 5 [place] single lumen power injectable PICC was advanced to the superior SVC/right atrial junction. Fluoroscopy during the procedure and fluoro spot radiograph confirms appropriate catheter position. The catheter was flushed and covered with asterile dressing. Catheter length: 40 cm IMPRESSION: Status post right basilic vein PICC placement. 40 cm single-lumen power injectable PICC placed. Catheter ready for use. Signed, Dulcy Fanny. Earleen Newport, DO [facility] Radiology Electronically Signed   By: Corrie Mckusick D.O.   On: 04/29/2015 15:05   Dg Chest Port 1 View  04/10/2015  CLINICAL DATA:  Chronic nonproductive cough EXAM: PORTABLE CHEST 1 VIEW COMPARISON:  October 24, 2014 FINDINGS: There is no edema or consolidation. The heart size and pulmonary vascularity are normal. No adenopathy. No bone lesions. IMPRESSION: No edema or consolidation. Electronically Signed   By: Lowella Grip III M.D.   On: 04/10/2015 10:52   Dg Foot Complete Right  04/04/2015  CLINICAL DATA:  Diabetic foot wound.  EXAM: RIGHT FOOT COMPLETE - 3+ VIEW COMPARISON:  08/08/2014 FINDINGS: Interval amputation of the great toe at the level of the distal metatarsal. There is erosive change of the medial cortex of the mid and proximal first metatarsal suggesting osteomyelitis. There is sclerotic periosteal reaction in this area consistent with chronic bony infection. Chronic fracture of the third metatarsal is unchanged. Prior amputation of the fourth toe is unchanged without evidence of osteomyelitis. IMPRESSION: Findings compatible with chronic osteomyelitis of the first metatarsal. Electronically Signed   By: Franchot Gallo M.D.   On: 04/04/2015 18:24    ASSESSMENT/PLAN:   Cellulitis of right foot - discontinue Doxycycline and start Tigecycline 50 mg IV Q 12 hours X 10 days and continue Florastor 250 mg 1 capsule PO BID X 13 days; insert PICC line for IV use; check  CMP and CBC    [place], NP [place]

## 2015-04-29 ENCOUNTER — Other Ambulatory Visit: Payer: Self-pay | Admitting: Internal Medicine

## 2015-04-29 ENCOUNTER — Ambulatory Visit (HOSPITAL_COMMUNITY)
Admission: RE | Admit: 2015-04-29 | Discharge: 2015-04-29 | Disposition: A | Discharge status: Home / Self Care | Payer: Medicare Other | Source: Ambulatory Visit | Attending: Internal Medicine | Admitting: Internal Medicine

## 2015-04-29 DIAGNOSIS — L089 Local infection of the skin and subcutaneous tissue, unspecified: Secondary | ICD-10-CM | POA: Insufficient documentation

## 2015-04-29 DIAGNOSIS — T148XXA Other injury of unspecified body region, initial encounter: Secondary | ICD-10-CM

## 2015-04-29 LAB — CBC AND DIFFERENTIAL
HCT: 29 % — AB (ref 36–46)
Hemoglobin: 9.1 g/dL — AB (ref 12.0–16.0)
NEUTROS ABS: 5 /uL
Platelets: 339 10*3/uL (ref 150–399)
WBC: 9.5 10^3/mL

## 2015-04-29 LAB — BASIC METABOLIC PANEL
BUN: 45 mg/dL — AB (ref 4–21)
CREATININE: 1.8 mg/dL — AB (ref 0.5–1.1)
Potassium: 5.5 mmol/L — AB (ref 3.4–5.3)
SODIUM: 141 mmol/L (ref 137–147)

## 2015-04-29 LAB — HEPATIC FUNCTION PANEL
ALK PHOS: 71 U/L (ref 25–125)
ALT: 6 U/L — AB (ref 7–35)
AST: 9 U/L — AB (ref 13–35)
Bilirubin, Total: 0.2 mg/dL

## 2015-04-29 MED ORDER — LIDOCAINE HCL 1 % IJ SOLN
INTRAMUSCULAR | Status: DC
Start: 2015-04-29 — End: 2015-04-30
  Filled 2015-04-29: qty 20

## 2015-04-29 MED ORDER — HEPARIN SOD (PORK) LOCK FLUSH 100 UNIT/ML IV SOLN
INTRAVENOUS | Status: DC
Start: 2015-04-29 — End: 2015-04-30
  Filled 2015-04-29: qty 5

## 2015-04-29 NOTE — Procedures (Signed)
Interventional Radiology Procedure Note  Procedure: Placement of a right basilic vein approach single lumen powerpicc.  40cm length.  Tip is positioned at the superior cavoatrial junction and catheter is ready for immediate use.  Complications: none Recommendations:  - Ok to shower tomorrow - Do not submerge   - Routine line care   Signed,  Dulcy Fanny. Earleen Newport, DO

## 2015-04-30 ENCOUNTER — Non-Acute Institutional Stay (SKILLED_NURSING_FACILITY): Payer: Medicare Other | Admitting: Adult Health

## 2015-04-30 ENCOUNTER — Encounter: Payer: Self-pay | Admitting: Adult Health

## 2015-04-30 DIAGNOSIS — E875 Hyperkalemia: Secondary | ICD-10-CM | POA: Diagnosis not present

## 2015-04-30 NOTE — Progress Notes (Signed)
Patient ID: [name], female   DOB: 06-07-1919, 82 y.o.   MRN: [id number]    DATE:    04/30/15  MRN:  [id number]  BIRTHDAY: 24-Mar-1919  Facility:  Nursing Home Location:  [place] Room Number: 208-P  LEVEL OF CARE:  SNF (31)  Contact Information    Name Relation Home Work [place] Daughter [phone]  [phone]       Code Status History    Date Active Date Inactive Code Status Order ID Comments User Context   04/10/2015  6:53 PM 04/12/2015 11:07 PM Full Code [id number]  Samella Parr, NP Inpatient   04/04/2015  9:56 PM 04/08/2015  6:23 PM Full Code [id number]  Norval Morton, MD ED   10/22/2014 11:27 AM 10/25/2014  8:06 PM DNR [id number]  Thurnell Lose, MD Inpatient   10/21/2014  5:31 PM 10/22/2014 11:27 AM Full Code [id number]  Robbie Lis, MD Inpatient   08/08/2014  5:20 PM 08/13/2014  6:08 PM Full Code [id number]  Theodis Blaze, MD Inpatient   11/18/2013 12:57 PM 11/21/2013  4:33 PM Full Code [id number]  Barton Dubois, MD Inpatient   08/28/2013 12:55 AM 08/30/2013  7:16 PM Full Code [id number]  Hosie Poisson, MD Inpatient   04/14/2012  7:40 PM 04/17/2012  5:41 PM Full Code [id number]  Newt Minion, MD Inpatient   04/12/2012 12:18 AM 04/14/2012  7:40 PM Full Code [id number]  Janell Quiet, MD Inpatient   04/21/2011  9:40 PM 04/27/2011  7:19 PM Full Code [id number]  Janae Bridgeman, RN Inpatient       Chief Complaint  Patient presents with  . Acute Visit    Hyperkalemia    HISTORY OF PRESENT ILLNESS:  This is a 82 year old female who was recently started on Doxycycline for right foot cellulitis but has to be changed to Tigecycline due to wound culture result showing MRSA and resistant to Doxycycline. PICC was inserted yesterday for IV use.   Her K was noted to be high - 5.5. No complaints of muscle spasm.  She has been re-admitted to [facility] on 04/12/15 from [facility]. She was having a short-term rehabilitation @ [facility] due to a recent right foot metatarsal amputation. She was noted to have slurred speech and was sent to the hospital. She did not receive IV tPA due to recent surgery. She has a history of chronic stroke affecting her right side with baseline right arm and leg weakness and right upper extremity incoordination. She has chronic numbness in the lower extremity. She, also, has history of atrial flutter/fibrillation but not on anticoagulation due to fall risk but on ASA 325 mg daily.  MRI shows no acute infarct. MRA shows high-grade stenosis proximal and distal aspect of the PCAs.  She is currently having a short-term rehabilitation.  PAST MEDICAL HISTORY:  Past Medical History  Diagnosis Date  . Myocardial infarction ([place])   . Coronary artery disease   . Hypertension   . Dysrhythmia   . Peripheral vascular disease ([place])   . Hypothyroidism   . Type 2 diabetes mellitus with diabetic polyneuropathy, with long-term current use of insulin ([place])   . Presbyesophagus 04/23/2011  . Urinary retention with incomplete bladder emptying 04/23/2011  . UTI (lower urinary tract infection) 04/21/2011  . Diastolic CHF, chronic ([facility])     Grade I diastolic dysfunction on Echo 04/26/11  . Anaphylaxis 04/25/2011    Thought to be  from Rocephin  . History of atrial flutter     ablation  . C. difficile colitis 04/17/2012  . Atrial flutter ([place]) 11/01/2012  . HCAP (healthcare-associated pneumonia) 10/21/2014  . Stroke [facility]) 2003    "lost the use of all of my right side"  . HOH (hard of hearing)   . CKD (chronic kidney disease), stage IV ([place])   . Expressive aphasia   . Hyperkalemia   . Hyponatremia   . Physical deconditioning   . Cellulitis of right foot   . Chronic osteomyelitis ([place])   . Chronic diastolic CHF (congestive heart failure) ([place])   . Neuropathy ([place])   . Acute blood loss anemia   . Cellulitis of right foot      CURRENT MEDICATIONS: Reviewed  Patient's Medications  New Prescriptions   No  medications on file  Previous Medications   ACETAMINOPHEN (TYLENOL) 325 MG TABLET    Take 325-650 mg by mouth every 6 (six) hours as needed for mild pain, moderate pain, fever or headache.   ASPIRIN 325 MG TABLET    Take 325 mg by mouth daily. Take for 30 days starting 3/15, then dropping down to 81mg  daily.   ASPIRIN 81 MG CHEWABLE TABLET    Chew 81 mg by mouth daily.   CARVEDILOL (COREG) 6.25 MG TABLET    Take 6.25 mg by mouth 2 (two) times daily with a meal. Reported on 04/10/2015   DILTIAZEM (CARDIZEM CD) 180 MG 24 HR CAPSULE    Take 180 mg by mouth at bedtime.    GABAPENTIN (NEURONTIN) 300 MG CAPSULE    Take 300 mg by mouth 3 (three) times daily.    INSULIN ASPART (NOVOLOG) 100 UNIT/ML INJECTION    Inject 2-12 Units into the skin 3 (three) times daily before meals. Give no units for BS 0-69 - hypoglycemic protocol & notify MD;  70-140 (0 units),141-180 (2 units), 181-220(4 units), 221-260 (6 units), 261-300(8 units), 301-340(10 units), >340 (12 units) notify MD   INSULIN GLARGINE (LANTUS) 100 UNIT/ML INJECTION    Inject 0.35 mLs (35 Units total) into the skin every morning.   ISOSORBIDE MONONITRATE (IMDUR) 30 MG 24 HR TABLET    Take 30 mg by mouth at bedtime.    LEVOTHYROXINE (SYNTHROID, LEVOTHROID) 175 MCG TABLET    Take 175 mcg by mouth daily before breakfast.   MULTIPLE VITAMIN (MULTIVITAMIN) TABLET    Take 1 tablet by mouth daily.   PROTEIN (PROCEL) POWD    Take 2 scoop by mouth 2 (two) times daily.   SACCHAROMYCES BOULARDII (FLORASTOR) 250 MG CAPSULE    Take 250 mg by mouth 2 (two) times daily. Take for 17 days   SODIUM POLYSTYRENE (KAYEXALATE) 15 GM/60ML SUSPENSION    Take 15 g by mouth once.   TAMSULOSIN HCL (FLOMAX) 0.4 MG CAPS    Take 0.4 mg by mouth every morning.    TIGECYCLINE IV    Inject 50 mg into the vein every 12 (twelve) hours. Give for 10 days via PICC line.   VITAMIN E PO    Take 1 capsule by mouth daily. 400 mg capsule for supplement  Modified Medications   No medications  on file  Discontinued Medications   No medications on file     Allergies  Allergen Reactions  . Corn-Containing Products   . Ephedrine     Unknown.    . Morphine Other (See Comments)    Difficulty waking up & talking after surgery  . Rocephin [Ceftriaxone]  REVIEW OF SYSTEMS:  GENERAL: no change in appetite, no fatigue, no weight changes, no fever, chills or weakness EYES: Denies change in vision, dry eyes, eye pain, itching or discharge EARS: Denies change in hearing, ringing in ears, or earache NOSE: Denies nasal congestion or epistaxis MOUTH and THROAT: Denies oral discomfort, gingival pain or bleeding, pain from teeth or hoarseness   RESPIRATORY: no cough, SOB, DOE, wheezing, hemoptysis CARDIAC: no chest pain, or palpitations GI: no abdominal pain, diarrhea, constipation, heart burn, nausea or vomiting GU: Denies dysuria, frequency, hematuria, incontinence, or discharge PSYCHIATRIC: Denies feeling of depression or anxiety. No report of hallucinations, insomnia, paranoia, or agitation   PHYSICAL EXAMINATION  GENERAL APPEARANCE: Well nourished. In no acute distress. Normal body habitus SKIN:  Right foot surgical incision is covered with dressing, dry HEAD: Normal in size and contour. No evidence of trauma EYES: Lids open and close normally. No blepharitis, entropion or ectropion. PERRL. Conjunctivae are clear and sclerae are white. Lenses are without opacity EARS: Pinnae are normal. Patient hears normal voice tunes of the examiner MOUTH and THROAT: Lips are without lesions. Oral mucosa is moist and without lesions. Tongue is normal in shape, size, and color and without lesions NECK: supple, trachea midline, no neck masses, no thyroid tenderness, no thyromegaly LYMPHATICS: no LAN in the neck, no supraclavicular LAN RESPIRATORY: breathing is even & unlabored, BS CTAB CARDIAC: Irregularly irregular, no murmur,no extra heart sounds, RLE trace edema GI: abdomen soft,  normal BS, no masses, no tenderness, no hepatomegaly, no splenomegaly EXTREMITIES:  Able to move X 4 extremities, generalized weakness PSYCHIATRIC: Alert and oriented X 3. Affect and behavior are appropriate  LABS/RADIOLOGY: Labs reviewed: Basic Metabolic Panel:  Recent Labs  10/21/14 1826  04/05/15 0419  04/07/15 0341  04/10/15 1007 04/10/15 1013 04/15/15 04/29/15  NA 135  < > 136  < > 136  < > 139 138 140 141  K 3.8  < > 4.6  < > 4.6  < > 4.2 4.2 4.9 5.5*  CL 102  < > 101  --  102  --  99* 96*  --   --   CO2 23  < > 25  --  27  --  28  --   --   --   GLUCOSE 328*  < > 250*  --  243*  --  315* 298*  --   --   BUN 51*  < > 53*  < > 55*  < > 40* 40* 32* 45*  CREATININE 1.85*  < > 1.72*  < > 1.82*  < > 1.77* 1.70* 1.6* 1.8*  CALCIUM 8.3*  < > 8.6*  --  8.1*  --  9.1  --   --   --   MG 1.6*  --   --   --   --   --   --   --   --   --   PHOS 3.0  --   --   --   --   --   --   --   --   --   < > = values in this interval not displayed. Liver Function Tests:  Recent Labs  10/21/14 1826 12/03/14 1303 04/10/15 1007 04/29/15  AST 15 19 15  9*  ALT 12* 12* 10* 6*  ALKPHOS 94 67 55 71  BILITOT 0.6 0.5 0.2*  --   PROT 7.0 7.2 6.5  --   ALBUMIN 2.4* 3.2* 2.6*  --  CBC:  Recent Labs  04/07/15 0341  04/08/15 0345  04/10/15 1007 04/10/15 1013 04/15/15 04/29/15  WBC 10.7*  < > 9.2  < > 8.8  --  9.7 9.5  NEUTROABS  --   --   --   < > 5.3  --  6 5  HGB 9.2*  < > 9.1*  < > 10.1* 11.6* 10.2* 9.1*  HCT 28.4*  < > 28.4*  < > 32.8* 34.0* 33* 29*  MCV 81.6  --  81.1  --  85.6  --   --   --   PLT 296  < > 308  < > 348  --  407* 339  < > = values in this interval not displayed.  Lipid Panel:  Recent Labs  04/11/15 0615  HDL 37*   Cardiac Enzymes:  Recent Labs  10/22/14 0513 10/22/14 1317 10/22/14 1818  TROPONINI 0.09* 0.04* 0.03   CBG:  Recent Labs  04/12/15 0652 04/12/15 1125 04/12/15 1628  GLUCAP 112* 232* 238*     Ct Head Wo Contrast  04/10/2015  CLINICAL  DATA:  Slurred speech EXAM: CT HEAD WITHOUT CONTRAST TECHNIQUE: Contiguous axial images were obtained from the base of the skull through the vertex without intravenous contrast. COMPARISON:  08/27/2013 FINDINGS: The bony calvarium is intact. No gross soft tissue abnormality is noted. Diffuse atrophic changes are noted. Areas of chronic white matter ischemic change are seen. No findings to suggest acute hemorrhage, acute infarction or space-occupying mass lesion are noted. IMPRESSION: Chronic atrophic and ischemic changes.  No acute abnormality noted. These results were called by telephone at the time of interpretation on 04/10/2015 at 10:38 am to Dr. Silverio Decamp, who verbally acknowledged these results. Electronically Signed   By: Inez Catalina M.D.   On: 04/10/2015 10:38   Mr Virgel Paling Wo Contrast  04/10/2015  CLINICAL DATA:  82 year old diabetic hypertensive female with prior stroke presenting with near syncopal episode and slurred speech. Subsequent encounter. EXAM: MRI HEAD WITHOUT CONTRAST MRA HEAD WITHOUT CONTRAST TECHNIQUE: Multiplanar, multiecho pulse sequences of the brain and surrounding structures were obtained without intravenous contrast. Angiographic images of the head were obtained using MRA technique without contrast. COMPARISON:  04/10/2015 CT.  02/04/2012 MR and MR angiogram. FINDINGS: MRI HEAD FINDINGS No acute infarct or intracranial hemorrhage. Moderate global atrophy without hydrocephalus. Mild to moderate chronic small vessel disease changes. No intracranial mass lesion noted on this unenhanced exam. Partial opacification left mastoid air cells without obstructing lesion of eustachian tube. Post lens replacement otherwise orbital structures unremarkable. Cervical spondylotic changes with spinal stenosis and cord flattening most notable C2-3 followed by the C3-4 and C4-5 level. MRA HEAD FINDINGS Anterior circulation without medium or large size vessel significant stenosis or occlusion. Moderate  moderate narrowing of portions of the middle cerebral artery branches and A2 segment anterior cerebral artery bilaterally. No high-grade stenosis distal vertebral arteries or basilar artery. Moderate posterior circulation branch vessel narrowing and irregularity including high-grade stenosis proximal and distal aspect of the posterior cerebral arteries more notable on the right. No aneurysm noted. IMPRESSION: MRI HEAD No acute infarct or intracranial hemorrhage. Moderate global atrophy without hydrocephalus. Mild to moderate chronic small vessel disease changes. No intracranial mass lesion noted on this unenhanced exam. Partial opacification left mastoid air cells. Cervical spondylotic changes with spinal stenosis and cord flattening most notable C2-3 followed by the C3-4 and C4-5 level. MRA HEAD Anterior circulation without medium or large size vessel significant stenosis or occlusion. Moderate moderate narrowing  of portions of the middle cerebral artery branches and A2 segment anterior cerebral artery bilaterally. No high-grade stenosis distal vertebral arteries or basilar artery. Moderate posterior circulation branch vessel narrowing and irregularity. High-grade stenosis proximal and distal aspect of the posterior cerebral arteries more notable on the right. Electronically Signed   By: Genia Del M.D.   On: 04/10/2015 14:52   Mr Brain Wo Contrast  04/10/2015  CLINICAL DATA:  82 year old diabetic hypertensive female with prior stroke presenting with near syncopal episode and slurred speech. Subsequent encounter. EXAM: MRI HEAD WITHOUT CONTRAST MRA HEAD WITHOUT CONTRAST TECHNIQUE: Multiplanar, multiecho pulse sequences of the brain and surrounding structures were obtained without intravenous contrast. Angiographic images of the head were obtained using MRA technique without contrast. COMPARISON:  04/10/2015 CT.  02/04/2012 MR and MR angiogram. FINDINGS: MRI HEAD FINDINGS No acute infarct or intracranial  hemorrhage. Moderate global atrophy without hydrocephalus. Mild to moderate chronic small vessel disease changes. No intracranial mass lesion noted on this unenhanced exam. Partial opacification left mastoid air cells without obstructing lesion of eustachian tube. Post lens replacement otherwise orbital structures unremarkable. Cervical spondylotic changes with spinal stenosis and cord flattening most notable C2-3 followed by the C3-4 and C4-5 level. MRA HEAD FINDINGS Anterior circulation without medium or large size vessel significant stenosis or occlusion. Moderate moderate narrowing of portions of the middle cerebral artery branches and A2 segment anterior cerebral artery bilaterally. No high-grade stenosis distal vertebral arteries or basilar artery. Moderate posterior circulation branch vessel narrowing and irregularity including high-grade stenosis proximal and distal aspect of the posterior cerebral arteries more notable on the right. No aneurysm noted. IMPRESSION: MRI HEAD No acute infarct or intracranial hemorrhage. Moderate global atrophy without hydrocephalus. Mild to moderate chronic small vessel disease changes. No intracranial mass lesion noted on this unenhanced exam. Partial opacification left mastoid air cells. Cervical spondylotic changes with spinal stenosis and cord flattening most notable C2-3 followed by the C3-4 and C4-5 level. MRA HEAD Anterior circulation without medium or large size vessel significant stenosis or occlusion. Moderate moderate narrowing of portions of the middle cerebral artery branches and A2 segment anterior cerebral artery bilaterally. No high-grade stenosis distal vertebral arteries or basilar artery. Moderate posterior circulation branch vessel narrowing and irregularity. High-grade stenosis proximal and distal aspect of the posterior cerebral arteries more notable on the right. Electronically Signed   By: Genia Del M.D.   On: 04/10/2015 14:52   Ir Fluoro Guide Cv  Line Right  04/29/2015  INDICATION: 82 year old female with a history of foot infection and requirement of antibiotics. EXAM: PICC LINE PLACEMENT WITH ULTRASOUND AND FLUOROSCOPIC GUIDANCE MEDICATIONS: None ANESTHESIA/SEDATION: None FLUOROSCOPY TIME:  Fluoroscopy Time: 0 minutes 12 seconds (1 mGy). COMPLICATIONS: None PROCEDURE: The patient was advised of the possible risks and complications and agreed to undergo the procedure. The patient was then brought to the angiographic suite for the procedure. The right arm was prepped with chlorhexidine, draped in the usual sterile fashion using maximum barrier technique (cap and mask, sterile gown, sterile gloves, large sterile sheet, hand hygiene and cutaneous antisepsis) and infiltrated locally with 1% Lidocaine. Ultrasound demonstrated patency of the right basilic vein, and this was documented with an image. Under real-time ultrasound guidance, this vein was accessed with a 21 gauge micropuncture needle and image documentation was performed. A 0.018 wire was introduced in to the vein. Over this, a 5 [place] single lumen power injectable PICC was advanced to the superior SVC/right atrial junction. Fluoroscopy during the procedure and fluoro spot  radiograph confirms appropriate catheter position. The catheter was flushed and covered with asterile dressing. Catheter length: 40 cm IMPRESSION: Status post right basilic vein PICC placement. 40 cm single-lumen power injectable PICC placed. Catheter ready for use. Signed, Dulcy Fanny. Earleen Newport, DO [facility] Electronically Signed   By: Corrie Mckusick D.O.   On: 04/29/2015 15:05   Ir US Guide Vasc Access Right  04/29/2015  INDICATION: 82 year old female with a history of foot infection and requirement of antibiotics. EXAM: PICC LINE PLACEMENT WITH ULTRASOUND AND FLUOROSCOPIC GUIDANCE MEDICATIONS: None ANESTHESIA/SEDATION: None FLUOROSCOPY TIME:  Fluoroscopy Time: 0 minutes 12  seconds (1 mGy). COMPLICATIONS: None PROCEDURE: The patient was advised of the possible risks and complications and agreed to undergo the procedure. The patient was then brought to the angiographic suite for the procedure. The right arm was prepped with chlorhexidine, draped in the usual sterile fashion using maximum barrier technique (cap and mask, sterile gown, sterile gloves, large sterile sheet, hand hygiene and cutaneous antisepsis) and infiltrated locally with 1% Lidocaine. Ultrasound demonstrated patency of the right basilic vein, and this was documented with an image. Under real-time ultrasound guidance, this vein was accessed with a 21 gauge micropuncture needle and image documentation was performed. A 0.018 wire was introduced in to the vein. Over this, a 5 [place] single lumen power injectable PICC was advanced to the superior SVC/right atrial junction. Fluoroscopy during the procedure and fluoro spot radiograph confirms appropriate catheter position. The catheter was flushed and covered with asterile dressing. Catheter length: 40 cm IMPRESSION: Status post right basilic vein PICC placement. 40 cm single-lumen power injectable PICC placed. Catheter ready for use. Signed, Dulcy Fanny. Earleen Newport, DO [facility] Electronically Signed   By: Corrie Mckusick D.O.   On: 04/29/2015 15:05   Dg Chest Port 1 View  04/10/2015  CLINICAL DATA:  Chronic nonproductive cough EXAM: PORTABLE CHEST 1 VIEW COMPARISON:  October 24, 2014 FINDINGS: There is no edema or consolidation. The heart size and pulmonary vascularity are normal. No adenopathy. No bone lesions. IMPRESSION: No edema or consolidation. Electronically Signed   By: Lowella Grip III M.D.   On: 04/10/2015 10:52   Dg Foot Complete Right  04/04/2015  CLINICAL DATA:  Diabetic foot wound. EXAM: RIGHT FOOT COMPLETE - 3+ VIEW COMPARISON:  08/08/2014 FINDINGS: Interval amputation of the great toe at the level  of the distal metatarsal. There is erosive change of the medial cortex of the mid and proximal first metatarsal suggesting osteomyelitis. There is sclerotic periosteal reaction in this area consistent with chronic bony infection. Chronic fracture of the third metatarsal is unchanged. Prior amputation of the fourth toe is unchanged without evidence of osteomyelitis. IMPRESSION: Findings compatible with chronic osteomyelitis of the first metatarsal. Electronically Signed   By: Franchot Gallo M.D.   On: 04/04/2015 18:24    ASSESSMENT/PLAN:   Hyperkalemia -  K 5.5; give Kayexalate 15 gm/60 ml PO X 1; BMP in AM and CBC in 1 week    [place], NP [place]

## 2015-05-03 ENCOUNTER — Inpatient Hospital Stay (HOSPITAL_COMMUNITY)
Admission: EM | Admit: 2015-05-03 | Discharge: 2015-05-12 | DRG: 871 | Disposition: A | Discharge status: Hospice | Payer: Medicare Other | Attending: Internal Medicine | Admitting: Internal Medicine

## 2015-05-03 ENCOUNTER — Emergency Department (HOSPITAL_COMMUNITY): Payer: Medicare Other

## 2015-05-03 ENCOUNTER — Encounter (HOSPITAL_COMMUNITY): Payer: Self-pay | Admitting: Nurse Practitioner

## 2015-05-03 DIAGNOSIS — Z91018 Allergy to other foods: Secondary | ICD-10-CM | POA: Diagnosis not present

## 2015-05-03 DIAGNOSIS — I252 Old myocardial infarction: Secondary | ICD-10-CM | POA: Diagnosis not present

## 2015-05-03 DIAGNOSIS — Z515 Encounter for palliative care: Secondary | ICD-10-CM | POA: Insufficient documentation

## 2015-05-03 DIAGNOSIS — R410 Disorientation, unspecified: Secondary | ICD-10-CM | POA: Insufficient documentation

## 2015-05-03 DIAGNOSIS — E1169 Type 2 diabetes mellitus with other specified complication: Secondary | ICD-10-CM | POA: Diagnosis present

## 2015-05-03 DIAGNOSIS — Z794 Long term (current) use of insulin: Secondary | ICD-10-CM

## 2015-05-03 DIAGNOSIS — Z888 Allergy status to other drugs, medicaments and biological substances status: Secondary | ICD-10-CM

## 2015-05-03 DIAGNOSIS — I639 Cerebral infarction, unspecified: Secondary | ICD-10-CM

## 2015-05-03 DIAGNOSIS — Z66 Do not resuscitate: Secondary | ICD-10-CM | POA: Diagnosis present

## 2015-05-03 DIAGNOSIS — E1165 Type 2 diabetes mellitus with hyperglycemia: Secondary | ICD-10-CM

## 2015-05-03 DIAGNOSIS — Z79899 Other long term (current) drug therapy: Secondary | ICD-10-CM

## 2015-05-03 DIAGNOSIS — Z833 Family history of diabetes mellitus: Secondary | ICD-10-CM | POA: Diagnosis not present

## 2015-05-03 DIAGNOSIS — M86671 Other chronic osteomyelitis, right ankle and foot: Secondary | ICD-10-CM | POA: Diagnosis present

## 2015-05-03 DIAGNOSIS — Z87891 Personal history of nicotine dependence: Secondary | ICD-10-CM | POA: Diagnosis not present

## 2015-05-03 DIAGNOSIS — H9190 Unspecified hearing loss, unspecified ear: Secondary | ICD-10-CM | POA: Diagnosis present

## 2015-05-03 DIAGNOSIS — F05 Delirium due to known physiological condition: Secondary | ICD-10-CM | POA: Diagnosis not present

## 2015-05-03 DIAGNOSIS — I130 Hypertensive heart and chronic kidney disease with heart failure and stage 1 through stage 4 chronic kidney disease, or unspecified chronic kidney disease: Secondary | ICD-10-CM | POA: Diagnosis present

## 2015-05-03 DIAGNOSIS — I4892 Unspecified atrial flutter: Secondary | ICD-10-CM | POA: Diagnosis present

## 2015-05-03 DIAGNOSIS — E1142 Type 2 diabetes mellitus with diabetic polyneuropathy: Secondary | ICD-10-CM | POA: Diagnosis present

## 2015-05-03 DIAGNOSIS — I482 Chronic atrial fibrillation, unspecified: Secondary | ICD-10-CM | POA: Insufficient documentation

## 2015-05-03 DIAGNOSIS — E162 Hypoglycemia, unspecified: Secondary | ICD-10-CM | POA: Insufficient documentation

## 2015-05-03 DIAGNOSIS — B9562 Methicillin resistant Staphylococcus aureus infection as the cause of diseases classified elsewhere: Secondary | ICD-10-CM | POA: Diagnosis present

## 2015-05-03 DIAGNOSIS — N390 Urinary tract infection, site not specified: Secondary | ICD-10-CM | POA: Diagnosis present

## 2015-05-03 DIAGNOSIS — E0821 Diabetes mellitus due to underlying condition with diabetic nephropathy: Secondary | ICD-10-CM | POA: Diagnosis not present

## 2015-05-03 DIAGNOSIS — R680 Hypothermia, not associated with low environmental temperature: Secondary | ICD-10-CM | POA: Diagnosis present

## 2015-05-03 DIAGNOSIS — Z7189 Other specified counseling: Secondary | ICD-10-CM | POA: Diagnosis not present

## 2015-05-03 DIAGNOSIS — I739 Peripheral vascular disease, unspecified: Secondary | ICD-10-CM | POA: Diagnosis present

## 2015-05-03 DIAGNOSIS — Z885 Allergy status to narcotic agent status: Secondary | ICD-10-CM | POA: Diagnosis not present

## 2015-05-03 DIAGNOSIS — Z7982 Long term (current) use of aspirin: Secondary | ICD-10-CM

## 2015-05-03 DIAGNOSIS — E039 Hypothyroidism, unspecified: Secondary | ICD-10-CM | POA: Diagnosis present

## 2015-05-03 DIAGNOSIS — E1122 Type 2 diabetes mellitus with diabetic chronic kidney disease: Secondary | ICD-10-CM | POA: Diagnosis present

## 2015-05-03 DIAGNOSIS — Z9049 Acquired absence of other specified parts of digestive tract: Secondary | ICD-10-CM

## 2015-05-03 DIAGNOSIS — Z809 Family history of malignant neoplasm, unspecified: Secondary | ICD-10-CM

## 2015-05-03 DIAGNOSIS — I10 Essential (primary) hypertension: Secondary | ICD-10-CM | POA: Diagnosis present

## 2015-05-03 DIAGNOSIS — IMO0002 Reserved for concepts with insufficient information to code with codable children: Secondary | ICD-10-CM | POA: Diagnosis present

## 2015-05-03 DIAGNOSIS — E1140 Type 2 diabetes mellitus with diabetic neuropathy, unspecified: Secondary | ICD-10-CM | POA: Diagnosis present

## 2015-05-03 DIAGNOSIS — R1310 Dysphagia, unspecified: Secondary | ICD-10-CM | POA: Diagnosis present

## 2015-05-03 DIAGNOSIS — I5032 Chronic diastolic (congestive) heart failure: Secondary | ICD-10-CM | POA: Diagnosis present

## 2015-05-03 DIAGNOSIS — I69320 Aphasia following cerebral infarction: Secondary | ICD-10-CM

## 2015-05-03 DIAGNOSIS — G9340 Encephalopathy, unspecified: Secondary | ICD-10-CM | POA: Diagnosis present

## 2015-05-03 DIAGNOSIS — I69351 Hemiplegia and hemiparesis following cerebral infarction affecting right dominant side: Secondary | ICD-10-CM

## 2015-05-03 DIAGNOSIS — F0390 Unspecified dementia without behavioral disturbance: Secondary | ICD-10-CM | POA: Diagnosis present

## 2015-05-03 DIAGNOSIS — N184 Chronic kidney disease, stage 4 (severe): Secondary | ICD-10-CM | POA: Diagnosis present

## 2015-05-03 DIAGNOSIS — Z8249 Family history of ischemic heart disease and other diseases of the circulatory system: Secondary | ICD-10-CM

## 2015-05-03 DIAGNOSIS — I2510 Atherosclerotic heart disease of native coronary artery without angina pectoris: Secondary | ICD-10-CM | POA: Diagnosis present

## 2015-05-03 DIAGNOSIS — Z881 Allergy status to other antibiotic agents status: Secondary | ICD-10-CM

## 2015-05-03 DIAGNOSIS — Z89439 Acquired absence of unspecified foot: Secondary | ICD-10-CM

## 2015-05-03 DIAGNOSIS — Z89421 Acquired absence of other right toe(s): Secondary | ICD-10-CM | POA: Diagnosis not present

## 2015-05-03 DIAGNOSIS — E11649 Type 2 diabetes mellitus with hypoglycemia without coma: Secondary | ICD-10-CM | POA: Diagnosis present

## 2015-05-03 DIAGNOSIS — Z8673 Personal history of transient ischemic attack (TIA), and cerebral infarction without residual deficits: Secondary | ICD-10-CM

## 2015-05-03 DIAGNOSIS — T68XXXA Hypothermia, initial encounter: Secondary | ICD-10-CM | POA: Diagnosis present

## 2015-05-03 DIAGNOSIS — R197 Diarrhea, unspecified: Secondary | ICD-10-CM | POA: Diagnosis not present

## 2015-05-03 DIAGNOSIS — E1129 Type 2 diabetes mellitus with other diabetic kidney complication: Secondary | ICD-10-CM | POA: Diagnosis present

## 2015-05-03 DIAGNOSIS — A419 Sepsis, unspecified organism: Principal | ICD-10-CM | POA: Diagnosis present

## 2015-05-03 DIAGNOSIS — R4182 Altered mental status, unspecified: Secondary | ICD-10-CM | POA: Diagnosis not present

## 2015-05-03 LAB — URINALYSIS, ROUTINE W REFLEX MICROSCOPIC
Bilirubin Urine: NEGATIVE
GLUCOSE, UA: 100 mg/dL — AB
KETONES UR: NEGATIVE mg/dL
Nitrite: NEGATIVE
PROTEIN: 30 mg/dL — AB
Specific Gravity, Urine: 1.016 (ref 1.005–1.030)
pH: 5.5 (ref 5.0–8.0)

## 2015-05-03 LAB — COMPREHENSIVE METABOLIC PANEL
ALT: 12 U/L — ABNORMAL LOW (ref 14–54)
AST: 12 U/L — ABNORMAL LOW (ref 15–41)
Albumin: 2.8 g/dL — ABNORMAL LOW (ref 3.5–5.0)
Alkaline Phosphatase: 67 U/L (ref 38–126)
Anion gap: 8 (ref 5–15)
BUN: 51 mg/dL — ABNORMAL HIGH (ref 6–20)
CHLORIDE: 107 mmol/L (ref 101–111)
CO2: 21 mmol/L — ABNORMAL LOW (ref 22–32)
Calcium: 8.1 mg/dL — ABNORMAL LOW (ref 8.9–10.3)
Creatinine, Ser: 1.37 mg/dL — ABNORMAL HIGH (ref 0.44–1.00)
GFR, EST AFRICAN AMERICAN: 37 mL/min — AB (ref >60)
GFR, EST NON AFRICAN AMERICAN: 32 mL/min — AB (ref >60)
Glucose, Bld: 174 mg/dL — ABNORMAL HIGH (ref 65–99)
POTASSIUM: 5 mmol/L (ref 3.5–5.1)
Sodium: 136 mmol/L (ref 135–145)
Total Bilirubin: 0.3 mg/dL (ref 0.3–1.2)
Total Protein: 6.1 g/dL — ABNORMAL LOW (ref 6.5–8.1)

## 2015-05-03 LAB — APTT: aPTT: 63 seconds — ABNORMAL HIGH (ref 24–37)

## 2015-05-03 LAB — CBG MONITORING, ED
GLUCOSE-CAPILLARY: 146 mg/dL — AB (ref 65–99)
GLUCOSE-CAPILLARY: 179 mg/dL — AB (ref 65–99)

## 2015-05-03 LAB — I-STAT CHEM 8, ED
BUN: 45 mg/dL — ABNORMAL HIGH (ref 6–20)
CALCIUM ION: 1.19 mmol/L (ref 1.13–1.30)
CREATININE: 1.4 mg/dL — AB (ref 0.44–1.00)
Chloride: 104 mmol/L (ref 101–111)
Glucose, Bld: 221 mg/dL — ABNORMAL HIGH (ref 65–99)
HCT: 33 % — ABNORMAL LOW (ref 36.0–46.0)
HEMOGLOBIN: 11.2 g/dL — AB (ref 12.0–15.0)
Potassium: 4.9 mmol/L (ref 3.5–5.1)
Sodium: 138 mmol/L (ref 135–145)
TCO2: 23 mmol/L (ref 0–100)

## 2015-05-03 LAB — PROTIME-INR
INR: 1.34 (ref 0.00–1.49)
PROTHROMBIN TIME: 16.2 s — AB (ref 11.6–15.2)

## 2015-05-03 LAB — CBC
HEMATOCRIT: 29.3 % — AB (ref 36.0–46.0)
Hemoglobin: 9.5 g/dL — ABNORMAL LOW (ref 12.0–15.0)
MCH: 26.9 pg (ref 26.0–34.0)
MCHC: 32.4 g/dL (ref 30.0–36.0)
MCV: 83 fL (ref 78.0–100.0)
PLATELETS: 331 10*3/uL (ref 150–400)
RBC: 3.53 MIL/uL — AB (ref 3.87–5.11)
RDW: 15.1 % (ref 11.5–15.5)
WBC: 13.1 10*3/uL — AB (ref 4.0–10.5)

## 2015-05-03 LAB — CBC WITH DIFFERENTIAL/PLATELET
Basophils Absolute: 0.1 10*3/uL (ref 0.0–0.1)
Basophils Relative: 0 %
Eosinophils Absolute: 0.3 10*3/uL (ref 0.0–0.7)
Eosinophils Relative: 1 %
HEMATOCRIT: 32.2 % — AB (ref 36.0–46.0)
Hemoglobin: 10.6 g/dL — ABNORMAL LOW (ref 12.0–15.0)
LYMPHS PCT: 9 %
Lymphs Abs: 1.8 10*3/uL (ref 0.7–4.0)
MCH: 27.7 pg (ref 26.0–34.0)
MCHC: 32.9 g/dL (ref 30.0–36.0)
MCV: 84.1 fL (ref 78.0–100.0)
MONO ABS: 1.1 10*3/uL — AB (ref 0.1–1.0)
MONOS PCT: 6 %
NEUTROS ABS: 16.5 10*3/uL — AB (ref 1.7–7.7)
Neutrophils Relative %: 84 %
Platelets: 340 10*3/uL (ref 150–400)
RBC: 3.83 MIL/uL — ABNORMAL LOW (ref 3.87–5.11)
RDW: 15.1 % (ref 11.5–15.5)
WBC: 19.7 10*3/uL — ABNORMAL HIGH (ref 4.0–10.5)

## 2015-05-03 LAB — URINE MICROSCOPIC-ADD ON

## 2015-05-03 LAB — BRAIN NATRIURETIC PEPTIDE: B NATRIURETIC PEPTIDE 5: 47.2 pg/mL (ref 0.0–100.0)

## 2015-05-03 LAB — LACTIC ACID, PLASMA
LACTIC ACID, VENOUS: 0.9 mmol/L (ref 0.5–2.0)
Lactic Acid, Venous: 0.8 mmol/L (ref 0.5–2.0)

## 2015-05-03 LAB — CORTISOL-AM, BLOOD: CORTISOL - AM: 6.1 ug/dL — AB (ref 6.7–22.6)

## 2015-05-03 LAB — PROCALCITONIN: PROCALCITONIN: 1.76 ng/mL

## 2015-05-03 LAB — TSH: TSH: 0.504 u[IU]/mL (ref 0.350–4.500)

## 2015-05-03 MED ORDER — ASPIRIN 300 MG RE SUPP
150.0000 mg | Freq: Every day | RECTAL | Status: DC
  Administered 2015-05-03 – 2015-05-12 (×9): 150 mg via RECTAL
  Filled 2015-05-03 (×10): qty 1

## 2015-05-03 MED ORDER — VANCOMYCIN HCL IN DEXTROSE 1-5 GM/200ML-% IV SOLN: 1000.0000 mg | Freq: Once | INTRAVENOUS | Status: DC

## 2015-05-03 MED ORDER — SODIUM CHLORIDE 0.9 % IV BOLUS (SEPSIS): 2500.0000 mL | Freq: Once | INTRAVENOUS | Status: DC

## 2015-05-03 MED ORDER — ACETAMINOPHEN 650 MG RE SUPP
650.0000 mg | Freq: Four times a day (QID) | RECTAL | Status: DC | PRN
Start: 2015-05-03 — End: 2015-05-12
  Administered 2015-05-06: 650 mg via RECTAL
  Filled 2015-05-03: qty 1

## 2015-05-03 MED ORDER — ENOXAPARIN SODIUM 30 MG/0.3ML ~~LOC~~ SOLN
30.0000 mg | SUBCUTANEOUS | Status: DC
  Administered 2015-05-03 – 2015-05-10 (×8): 30 mg via SUBCUTANEOUS
  Filled 2015-05-03 (×8): qty 0.3

## 2015-05-03 MED ORDER — SODIUM CHLORIDE 0.9% FLUSH
3.0000 mL | Freq: Two times a day (BID) | INTRAVENOUS | Status: DC
Start: 2015-05-03 — End: 2015-05-12
  Administered 2015-05-06 – 2015-05-11 (×3): 3 mL via INTRAVENOUS

## 2015-05-03 MED ORDER — PIPERACILLIN-TAZOBACTAM 3.375 G IVPB 30 MIN: 3.3750 g | Freq: Once | INTRAVENOUS | Status: DC

## 2015-05-03 MED ORDER — SODIUM CHLORIDE 0.9 % IV SOLN
250.0000 mg | Freq: Four times a day (QID) | INTRAVENOUS | Status: DC
  Administered 2015-05-03: 250 mg via INTRAVENOUS
  Filled 2015-05-03 (×2): qty 250

## 2015-05-03 MED ORDER — TIGECYCLINE 50 MG IV SOLR: 50.0000 mg | Freq: Two times a day (BID) | INTRAVENOUS | Status: DC

## 2015-05-03 MED ORDER — SODIUM CHLORIDE 0.9 % IV SOLN
250.0000 mg | INTRAVENOUS | Status: AC
  Administered 2015-05-03: 250 mg via INTRAVENOUS
  Filled 2015-05-03: qty 250

## 2015-05-03 MED ORDER — HEPARIN SOD (PORK) LOCK FLUSH 10 UNIT/ML IV SOLN
50.0000 [IU] | Freq: Two times a day (BID) | INTRAVENOUS | Status: DC
  Filled 2015-05-03 (×21): qty 5

## 2015-05-03 MED ORDER — IMIPENEM-CILASTATIN 250 MG IV SOLR: 250.0000 mg | Freq: Two times a day (BID) | INTRAVENOUS | Status: DC

## 2015-05-03 MED ORDER — SODIUM CHLORIDE 0.9 % IV SOLN
50.0000 mg | Freq: Two times a day (BID) | INTRAVENOUS | Status: DC
  Administered 2015-05-03 – 2015-05-05 (×5): 50 mg via INTRAVENOUS
  Filled 2015-05-03 (×6): qty 50

## 2015-05-03 MED ORDER — SODIUM CHLORIDE 0.9 % IV SOLN: INTRAVENOUS | Status: DC

## 2015-05-03 MED ORDER — SODIUM CHLORIDE 0.9 % IV BOLUS (SEPSIS): 1000.0000 mL | Freq: Once | INTRAVENOUS | Status: DC

## 2015-05-03 MED ORDER — ACETAMINOPHEN 325 MG PO TABS
650.0000 mg | ORAL_TABLET | Freq: Four times a day (QID) | ORAL | Status: DC | PRN
  Administered 2015-05-04 – 2015-05-10 (×2): 650 mg via ORAL
  Filled 2015-05-03 (×4): qty 2

## 2015-05-03 MED ORDER — LEVOTHYROXINE SODIUM 100 MCG IV SOLR
75.0000 ug | Freq: Every day | INTRAVENOUS | Status: DC
  Administered 2015-05-03 – 2015-05-12 (×10): 75 ug via INTRAVENOUS
  Filled 2015-05-03 (×12): qty 5

## 2015-05-03 MED ORDER — METOPROLOL TARTRATE 1 MG/ML IV SOLN
2.5000 mg | Freq: Three times a day (TID) | INTRAVENOUS | Status: DC
  Administered 2015-05-03 – 2015-05-04 (×5): 2.5 mg via INTRAVENOUS
  Filled 2015-05-03 (×7): qty 5

## 2015-05-03 MED ORDER — SODIUM CHLORIDE 0.9 % IV SOLN
INTRAVENOUS | Status: DC
  Administered 2015-05-03 – 2015-05-05 (×4): via INTRAVENOUS

## 2015-05-03 MED ORDER — ONDANSETRON HCL 4 MG/2ML IJ SOLN
4.0000 mg | Freq: Four times a day (QID) | INTRAMUSCULAR | Status: DC | PRN
  Administered 2015-05-03 – 2015-05-11 (×4): 4 mg via INTRAVENOUS
  Filled 2015-05-03 (×4): qty 2

## 2015-05-03 NOTE — ED Notes (Signed)
Bed: [id number] Expected date:  Expected time:  Means of arrival:  Comments: 89 hypotension/brady

## 2015-05-03 NOTE — ED Notes (Signed)
Report given to [place], RN

## 2015-05-03 NOTE — Evaluation (Signed)
Physical Therapy Evaluation Patient Details Name: [name] MRN: [id number] DOB: 09/15/1919 Today's Date: 05/03/2015   History of Present Illness  82 yo female admitted to [facility] with hyopglycemia and altered mental status.   Clinical Impression  Pt admitted from [facility] and wants to return there to continue her rehab. Feel she will be able to continue to benefit from their program    Follow Up Recommendations SNF    Equipment Recommendations       Recommendations for Other Services       Precautions / Restrictions Precautions Precautions: Fall Precaution Comments: contact precautions Required Braces or Orthoses: Other Brace/Splint Other Brace/Splint: Darco Shoe (from previous note  Restrictions Weight Bearing Restrictions: Yes RLE Weight Bearing: Weight bearing as tolerated (darco shoe )      Mobility  Bed Mobility Overal bed mobility: +2 for physical assistance Bed Mobility: Rolling Rolling: +2 for physical assistance         General bed mobility comments: pt incontinent of large amount of urine and appeared uncomfortable due to hunger and being cold. Assisted nursing tech with rolling to clean patient, applied warmed blankets and assisted with try set up as food try had arrived.  Pt observed to be feeding self with spoom   Transfers                    Ambulation/Gait                Stairs            Wheelchair Mobility    Modified Rankin (Stroke Patients Only)       Balance                                             Pertinent Vitals/Pain Pain Assessment: Faces Faces Pain Scale: Hurts even more Pain Descriptors / Indicators: Discomfort;Grimacing;Moaning Pain Intervention(s): Limited activity within patient's tolerance;Monitored during session;Repositioned    Home Living                        Prior Function                 Hand Dominance        Extremity/Trunk Assessment   Upper  Extremity Assessment: Generalized weakness           Lower Extremity Assessment: Generalized weakness;RLE deficits/detail RLE Deficits / Details: decreased  strenth ~ 3-/5, but pt is able to move legs to command        Communication      Cognition Arousal/Alertness: Awake/alert Behavior During Therapy: WFL for tasks assessed/performed Overall Cognitive Status: Within Functional Limits for tasks assessed (pt knowsher birthday/ hopes that [facility] is saving her bed )                      General Comments General comments (skin integrity, edema, etc.): soft dressing on right foot from transmetatarsal amputation site to knee     Exercises        Assessment/Plan    PT Assessment Patient needs continued PT services  PT Diagnosis Difficulty walking;Generalized weakness;Acute pain   PT Problem List Decreased strength;Decreased activity tolerance;Decreased mobility;Decreased skin integrity;Pain  PT Treatment Interventions Functional mobility training;Therapeutic activities;Therapeutic exercise;Balance training;Neuromuscular re-education;Wheelchair mobility training   PT Goals (Current goals can be found in the  Care Plan section) Acute Rehab PT Goals Patient Stated Goal: go back to [facility] 2X/week   Barriers to discharge Other (comment) (pt wants to discharge back to [facility])      Co-evaluation               End of Session   Activity Tolerance: Other (comment) (limited by incontinence and food tray arrived/ pt hungry) Patient left: in bed;with nursing/sitter in room Nurse Communication: Other (comment) (d/c recommendations)         Time: [id number] PT Time Calculation (min) (ACUTE ONLY): 35 min   Charges:   PT Evaluation $PT Eval Low Complexity: 1 Procedure     PT G Codes:       Stormy Sabol K. Owens Shark, PT  05/03/2015, 3:37 PM

## 2015-05-03 NOTE — ED Notes (Signed)
Pt has had 3 episodes of diarrhea. With the second one being very large.

## 2015-05-03 NOTE — ED Notes (Signed)
[name] presented from [place], where she had a hypoglycemic episode with cbg of 45, reportedly reversed via oral glucose pills. Pt also reportedly confused, although AOx2.

## 2015-05-03 NOTE — ED Notes (Signed)
Made two unsuccessful attempts to draw blood for TSH.

## 2015-05-03 NOTE — Progress Notes (Signed)
Patient weighed with mechanical lift.  Weight on lift 29.7 kg.  Previous weight on 04/30/15 74.8 kg.  Weight today according to mechanical lift is not accurate.

## 2015-05-03 NOTE — ED Provider Notes (Signed)
CSN: [id number]     Arrival date & time 05/03/15  0002 History  By signing my name below, I, Emmanuella Mensah, attest that this documentation has been prepared under the direction and in the presence of Aashritha Miedema, MD. Electronically Signed: Judithann Sauger, ED Scribe. 05/03/2015. 12:54 AM.    Chief Complaint  Patient presents with  . Hypoglycemia  . Altered Mental Status   Patient is a 82 y.o. female presenting with hypoglycemia. The history is provided by medical records and the EMS personnel. No language interpreter was used.  Hypoglycemia Initial blood sugar:  45 Severity:  Severe Onset quality:  Unable to specify Timing:  Rare Progression:  Resolved Chronicity:  New Diabetic status:  Controlled with insulin Context: recent illness   Relieved by:  Nothing Ineffective treatments:  None tried Associated symptoms: altered mental status   Risk factors: no pregnancy    HPI Comments: Level 5 Caveat due to being found by [place] is a 82 y.o. female with a hx of MI, CAD, HTN, DM II, and diastolic CHF who presents to the Emergency Department for evaluation of hypoglycemia and altered mental status. Per EMS, pt had a cbg of 45 at the facility and was also confused.    Past Medical History  Diagnosis Date  . Myocardial infarction ([place])   . Coronary artery disease   . Hypertension   . Dysrhythmia   . Peripheral vascular disease ([place])   . Hypothyroidism   . Type 2 diabetes mellitus with diabetic polyneuropathy, with long-term current use of insulin ([place])   . Presbyesophagus 04/23/2011  . Urinary retention with incomplete bladder emptying 04/23/2011  . UTI (lower urinary tract infection) 04/21/2011  . Diastolic CHF, chronic (HCC)     Grade I diastolic dysfunction on Echo 04/26/11  . Anaphylaxis 04/25/2011    Thought to be from Rocephin  . History of atrial flutter     ablation  . C. difficile colitis 04/17/2012  . Atrial flutter ([place]) 11/01/2012  .  HCAP (healthcare-associated pneumonia) 10/21/2014  . Stroke [facility]) 2003    "lost the use of all of my right side"  . HOH (hard of hearing)   . CKD (chronic kidney disease), stage IV ([place])   . Expressive aphasia   . Hyperkalemia   . Hyponatremia   . Physical deconditioning   . Cellulitis of right foot   . Chronic osteomyelitis ([place])   . Chronic diastolic CHF (congestive heart failure) ([place])   . Neuropathy ([place])   . Acute blood loss anemia   . Cellulitis of right foot    Past Surgical History  Procedure Laterality Date  . Cholecystectomy    . Colonoscopy  01/15/2011    Procedure: COLONOSCOPY;  Surgeon: Beryle Beams, MD;  Location: [facility] ENDOSCOPY;  Service: Endoscopy;  Laterality: N/A;  . Amputation Right 04/14/2012    Procedure: AMPUTATION RAY;  Surgeon: Newt Minion, MD;  Location: [facility] ORS;  Service: Orthopedics;  Laterality: Right;  ankle block  . Cardiac electrophysiology mapping and ablation    . Nm myocar perf wall motion  01/23/2009    mild ischemia mid inferior & apical inferior.  EF 75%  . Amputation Right 08/10/2014    Procedure: AMPUTATION RAY;  Surgeon: Mcarthur Rossetti, MD;  Location: [facility];  Service: Orthopedics;  Laterality: Right;  ankle block  . Appendectomy    . Shoulder surgery Right     "put back in place'  . Eye  surgery Bilateral     Cararact  . I&d extremity Right 12/03/2014    Procedure: IRRIGATION AND DEBRIDEMENT RIGHT FOOT;  Surgeon: Mcarthur Rossetti, MD;  Location: [place];  Service: Orthopedics;  Laterality: Right;  . Amputation Right 04/06/2015    Procedure: Transmetatarsal AMPUTATION RIGHT FOOT;  Surgeon: Mcarthur Rossetti, MD;  Location: [facility];  Service: Orthopedics;  Laterality: Right;   Family History  Problem Relation Age of Onset  . Cancer Sister   . Diabetes Mother   . Heart disease Father    Social History  Substance Use Topics  . Smoking status: Former Smoker -- 0.10 packs/day for 40 years    Types: Cigarettes    Quit date:  04/20/1981  . Smokeless tobacco: Never Used  . Alcohol Use: Yes     Comment: hx social   OB History    No data available     Review of Systems  Unable to perform ROS: Mental status change  Constitutional: Negative for fever.      Allergies  Corn-containing products; Ephedrine; Morphine; and Rocephin  Home Medications   Prior to Admission medications   Medication Sig Start Date End Date Taking? Authorizing Provider  acetaminophen (TYLENOL) 325 MG tablet Take 325-650 mg by mouth every 6 (six) hours as needed for mild pain, moderate pain, fever or headache.   Yes Historical Provider, MD  aspirin EC 325 MG tablet Take 325 mg by mouth daily. 04/12/15 05/13/15 Yes Historical Provider, MD  carvedilol (COREG) 6.25 MG tablet Take 6.25 mg by mouth 2 (two) times daily with a meal.    Yes Historical Provider, MD  diltiazem (CARDIZEM CD) 180 MG 24 hr capsule Take 180 mg by mouth at bedtime.    Yes Historical Provider, MD  gabapentin (NEURONTIN) 300 MG capsule Take 300 mg by mouth 3 (three) times daily.    Yes Ivan Anchors Love, PA-C  heparin flush 10 UNIT/ML SOLN injection Inject 50 Units into the vein every 12 (twelve) hours.   Yes Historical Provider, MD  insulin aspart (NOVOLOG) 100 UNIT/ML injection Inject 0-12 Units into the skin 3 (three) times daily with meals as needed for high blood sugar. Pt uses as needed per sliding scale:    70-140:  0 units  141-180:  2 units  181-220:  4 units  221-260:  6 units  261-300:  8 units  301-340:  10 units  [phone]:  12 units   Yes Historical Provider, MD  insulin glargine (LANTUS) 100 UNIT/ML injection Inject 0.35 mLs (35 Units total) into the skin every morning. 04/08/15  Yes Modena Jansky, MD  isosorbide mononitrate (IMDUR) 30 MG 24 hr tablet Take 30 mg by mouth at bedtime.    Yes Historical Provider, MD  levothyroxine (SYNTHROID, LEVOTHROID) 175 MCG tablet Take 175 mcg by mouth daily before breakfast.   Yes Historical Provider, MD  Multiple  Vitamin (MULTIVITAMIN WITH MINERALS) TABS tablet Take 1 tablet by mouth daily.   Yes Historical Provider, MD  Protein (PROCEL) POWD Take 2 scoop by mouth 2 (two) times daily.   Yes Historical Provider, MD  saccharomyces boulardii (FLORASTOR) 250 MG capsule Take 250 mg by mouth 2 (two) times daily.  04/28/15 06/08/15 Yes Historical Provider, MD  Sodium Chloride Flush (NORMAL SALINE FLUSH IV) Inject 10 mLs into the vein See admin instructions. [name] to use two times daily and between IV medications.   **  Use bacteriostatic saline if more than 14mL per day.   Yes  Historical Provider, MD  Tamsulosin HCl (FLOMAX) 0.4 MG CAPS Take 0.4 mg by mouth daily.    Yes Historical Provider, MD  TIGECYCLINE IV Inject 50 mg into the vein every 12 (twelve) hours.  04/28/15 05/10/15 Yes Historical Provider, MD  vitamin E 400 UNIT capsule Take 400 Units by mouth daily.   Yes Historical Provider, MD   BP 144/57 mmHg  Pulse 66  Temp(Src) 94.7 F (34.8 C) (Rectal)  Resp 21  SpO2 92% Physical Exam  Constitutional: She appears well-developed and well-nourished. No distress.  HENT:  Head: Normocephalic and atraumatic.  dry mucous membrane  Eyes: Conjunctivae are normal.  Pin point pupils   Neck: Normal range of motion. Neck supple. No tracheal deviation present.  Cardiovascular: Normal rate, regular rhythm, normal heart sounds and intact distal pulses.   Pulmonary/Chest: Effort normal. No stridor. No respiratory distress. She has no wheezes. She has no rales.  Lungs are diminished but no rales  Abdominal: Soft. There is no tenderness. There is no rebound and no guarding.  Hyperactive bowel sounds  Musculoskeletal: Normal range of motion.  S/p trans metatarsal amputation, clean and dry and intact incision  Lymphadenopathy:    She has no cervical adenopathy.  Neurological: She has normal reflexes. GCS eye subscore is 3. GCS verbal subscore is 3. GCS motor subscore is 6.  Skin: Skin is warm and dry.  Psychiatric:   unable  Nursing note and vitals reviewed.   ED Course  Procedures (including critical care time) DIAGNOSTIC STUDIES: Oxygen Saturation is 92% on RA, low by my interpretation.    COORDINATION OF CARE: 12:46 AM- Pt advised of plan for treatment and pt agrees.    Labs Review Labs Reviewed  CBG MONITORING, ED - Abnormal; Notable for the following:    Glucose-Capillary 146 (*)    All other components within normal limits    Imaging Review No results found.   Rodriguez Aguinaldo, MD has personally reviewed and evaluated these images and lab results as part of her medical decision-making.   EKG Interpretation   Date/Time:  Saturday Montague Corella 08 2017 00:03:44 EDT Ventricular Rate:  65 PR Interval:  56 QRS Duration: 86 QT Interval:  443 QTC Calculation: 461 R Axis:   69 Text Interpretation:  Sinus rhythm Short PR interval Confirmed by  [facility]  MD, Keelyn Monjaras ([id number]) on 05/03/2015 12:56:53 AM      MDM   Final diagnoses:  None  BP 144/57 mmHg  Pulse 66  Temp(Src) 94.7 F (34.8 C) (Rectal)  Resp 21  SpO2 92%    EKG Interpretation  Date/Time:  Saturday Alexiya Franqui 08 2017 00:03:44 EDT Ventricular Rate:  65 PR Interval:  56 QRS Duration: 86 QT Interval:  443 QTC Calculation: 461 R Axis:   69 Text Interpretation:  Sinus rhythm Short PR interval Confirmed by [facility]  MD, Jeniffer Culliver ([id number]) on 05/03/2015 12:56:53 AM      Filed Vitals:   05/03/15 0300 05/03/15 0330  BP: 118/49 123/49  Pulse: 61 63  Temp:    Resp: 12 14   Results for orders placed or performed during the hospital encounter of 05/03/15  CBC with Differential/Platelet  Result Value Ref Range   WBC 19.7 (H) 4.0 - 10.5 K/uL   RBC 3.83 (L) 3.87 - 5.11 MIL/uL   Hemoglobin 10.6 (L) 12.0 - 15.0 g/dL   HCT 32.2 (L) 36.0 - 46.0 %   MCV 84.1 78.0 - 100.0 fL   MCH 27.7 26.0 - 34.0 pg   MCHC  32.9 30.0 - 36.0 g/dL   RDW 15.1 11.5 - 15.5 %   Platelets 340 150 - 400 K/uL   Neutrophils Relative % 84 %   Neutro Abs  16.5 (H) 1.7 - 7.7 K/uL   Lymphocytes Relative 9 %   Lymphs Abs 1.8 0.7 - 4.0 K/uL   Monocytes Relative 6 %   Monocytes Absolute 1.1 (H) 0.1 - 1.0 K/uL   Eosinophils Relative 1 %   Eosinophils Absolute 0.3 0.0 - 0.7 K/uL   Basophils Relative 0 %   Basophils Absolute 0.1 0.0 - 0.1 K/uL  Urinalysis, Routine w reflex microscopic (not at [facility])  Result Value Ref Range   Color, Urine YELLOW YELLOW   APPearance CLOUDY (A) CLEAR   Specific Gravity, Urine 1.016 1.005 - 1.030   pH 5.5 5.0 - 8.0   Glucose, UA 100 (A) NEGATIVE mg/dL   Hgb urine dipstick MODERATE (A) NEGATIVE   Bilirubin Urine NEGATIVE NEGATIVE   Ketones, ur NEGATIVE NEGATIVE mg/dL   Protein, ur 30 (A) NEGATIVE mg/dL   Nitrite NEGATIVE NEGATIVE   Leukocytes, UA LARGE (A) NEGATIVE  Urine microscopic-add on  Result Value Ref Range   Squamous Epithelial / LPF 0-5 (A) NONE SEEN   WBC, UA TOO NUMEROUS TO COUNT 0 - 5 WBC/hpf   RBC / HPF TOO NUMEROUS TO COUNT 0 - 5 RBC/hpf   Bacteria, UA MANY (A) NONE SEEN  CBG monitoring, ED  Result Value Ref Range   Glucose-Capillary 146 (H) 65 - 99 mg/dL   Comment 1 Notify RN   I-stat chem 8, ed  Result Value Ref Range   Sodium 138 135 - 145 mmol/L   Potassium 4.9 3.5 - 5.1 mmol/L   Chloride 104 101 - 111 mmol/L   BUN 45 (H) 6 - 20 mg/dL   Creatinine, Ser 1.40 (H) 0.44 - 1.00 mg/dL   Glucose, Bld 221 (H) 65 - 99 mg/dL   Calcium, Ion 1.19 1.13 - 1.30 mmol/L   TCO2 23 0 - 100 mmol/L   Hemoglobin 11.2 (L) 12.0 - 15.0 g/dL   HCT 33.0 (L) 36.0 - 46.0 %   Dg Chest 2 View  05/03/2015  CLINICAL DATA:  Hypoglycemia.  Cough and congestion. EXAM: CHEST  2 VIEW COMPARISON:  04/10/2015 FINDINGS: There is a right upper extremity PICC line extending into the low SVC. There is mild vascular and interstitial prominence which is unchanged. No consolidation. No effusions. Unchanged mild cardiomegaly. IMPRESSION: Unchanged mild cardiomegaly and vascular/interstitial prominence. No consolidation or effusion.  Electronically Signed   By: Andreas Newport M.D.   On: 05/03/2015 01:51   Ct Head Wo Contrast  05/03/2015  CLINICAL DATA:  Hypoglycemia.  Altered mental status. EXAM: CT HEAD WITHOUT CONTRAST TECHNIQUE: Contiguous axial images were obtained from the base of the skull through the vertex without intravenous contrast. COMPARISON:  04/10/2015 FINDINGS: There is no intracranial hemorrhage, mass or evidence of acute infarction. There is no extra-axial fluid collection. There is moderate generalized atrophy. There is white matter hypodensity, chronic and likely due to small vessel disease. No acute intracranial findings are evident. No interval change is evident. No bony abnormalities evident. The visible paranasal sinuses are clear. IMPRESSION: Generalized atrophy and chronic white matter hypodensities, likely small vessel disease. No acute intracranial finding. Electronically Signed   By: Andreas Newport M.D.   On: 05/03/2015 01:52   Ct Head Wo Contrast  04/10/2015  CLINICAL DATA:  Slurred speech EXAM: CT HEAD WITHOUT CONTRAST TECHNIQUE: Contiguous axial  images were obtained from the base of the skull through the vertex without intravenous contrast. COMPARISON:  08/27/2013 FINDINGS: The bony calvarium is intact. No gross soft tissue abnormality is noted. Diffuse atrophic changes are noted. Areas of chronic white matter ischemic change are seen. No findings to suggest acute hemorrhage, acute infarction or space-occupying mass lesion are noted. IMPRESSION: Chronic atrophic and ischemic changes.  No acute abnormality noted. These results were called by telephone at the time of interpretation on 04/10/2015 at 10:38 am to Dr. Silverio Decamp, who verbally acknowledged these results. Electronically Signed   By: Inez Catalina M.D.   On: 04/10/2015 10:38   Mr Virgel Paling Wo Contrast  04/10/2015  CLINICAL DATA:  82 year old diabetic hypertensive female with prior stroke presenting with near syncopal episode and slurred speech.  Subsequent encounter. EXAM: MRI HEAD WITHOUT CONTRAST MRA HEAD WITHOUT CONTRAST TECHNIQUE: Multiplanar, multiecho pulse sequences of the brain and surrounding structures were obtained without intravenous contrast. Angiographic images of the head were obtained using MRA technique without contrast. COMPARISON:  04/10/2015 CT.  02/04/2012 MR and MR angiogram. FINDINGS: MRI HEAD FINDINGS No acute infarct or intracranial hemorrhage. Moderate global atrophy without hydrocephalus. Mild to moderate chronic small vessel disease changes. No intracranial mass lesion noted on this unenhanced exam. Partial opacification left mastoid air cells without obstructing lesion of eustachian tube. Post lens replacement otherwise orbital structures unremarkable. Cervical spondylotic changes with spinal stenosis and cord flattening most notable C2-3 followed by the C3-4 and C4-5 level. MRA HEAD FINDINGS Anterior circulation without medium or large size vessel significant stenosis or occlusion. Moderate moderate narrowing of portions of the middle cerebral artery branches and A2 segment anterior cerebral artery bilaterally. No high-grade stenosis distal vertebral arteries or basilar artery. Moderate posterior circulation branch vessel narrowing and irregularity including high-grade stenosis proximal and distal aspect of the posterior cerebral arteries more notable on the right. No aneurysm noted. IMPRESSION: MRI HEAD No acute infarct or intracranial hemorrhage. Moderate global atrophy without hydrocephalus. Mild to moderate chronic small vessel disease changes. No intracranial mass lesion noted on this unenhanced exam. Partial opacification left mastoid air cells. Cervical spondylotic changes with spinal stenosis and cord flattening most notable C2-3 followed by the C3-4 and C4-5 level. MRA HEAD Anterior circulation without medium or large size vessel significant stenosis or occlusion. Moderate moderate narrowing of portions of the middle  cerebral artery branches and A2 segment anterior cerebral artery bilaterally. No high-grade stenosis distal vertebral arteries or basilar artery. Moderate posterior circulation branch vessel narrowing and irregularity. High-grade stenosis proximal and distal aspect of the posterior cerebral arteries more notable on the right. Electronically Signed   By: Genia Del M.D.   On: 04/10/2015 14:52   Mr Brain Wo Contrast  04/10/2015  CLINICAL DATA:  82 year old diabetic hypertensive female with prior stroke presenting with near syncopal episode and slurred speech. Subsequent encounter. EXAM: MRI HEAD WITHOUT CONTRAST MRA HEAD WITHOUT CONTRAST TECHNIQUE: Multiplanar, multiecho pulse sequences of the brain and surrounding structures were obtained without intravenous contrast. Angiographic images of the head were obtained using MRA technique without contrast. COMPARISON:  04/10/2015 CT.  02/04/2012 MR and MR angiogram. FINDINGS: MRI HEAD FINDINGS No acute infarct or intracranial hemorrhage. Moderate global atrophy without hydrocephalus. Mild to moderate chronic small vessel disease changes. No intracranial mass lesion noted on this unenhanced exam. Partial opacification left mastoid air cells without obstructing lesion of eustachian tube. Post lens replacement otherwise orbital structures unremarkable. Cervical spondylotic changes with spinal stenosis and cord flattening most notable  C2-3 followed by the C3-4 and C4-5 level. MRA HEAD FINDINGS Anterior circulation without medium or large size vessel significant stenosis or occlusion. Moderate moderate narrowing of portions of the middle cerebral artery branches and A2 segment anterior cerebral artery bilaterally. No high-grade stenosis distal vertebral arteries or basilar artery. Moderate posterior circulation branch vessel narrowing and irregularity including high-grade stenosis proximal and distal aspect of the posterior cerebral arteries more notable on the right. No  aneurysm noted. IMPRESSION: MRI HEAD No acute infarct or intracranial hemorrhage. Moderate global atrophy without hydrocephalus. Mild to moderate chronic small vessel disease changes. No intracranial mass lesion noted on this unenhanced exam. Partial opacification left mastoid air cells. Cervical spondylotic changes with spinal stenosis and cord flattening most notable C2-3 followed by the C3-4 and C4-5 level. MRA HEAD Anterior circulation without medium or large size vessel significant stenosis or occlusion. Moderate moderate narrowing of portions of the middle cerebral artery branches and A2 segment anterior cerebral artery bilaterally. No high-grade stenosis distal vertebral arteries or basilar artery. Moderate posterior circulation branch vessel narrowing and irregularity. High-grade stenosis proximal and distal aspect of the posterior cerebral arteries more notable on the right. Electronically Signed   By: Genia Del M.D.   On: 04/10/2015 14:52   Ir Fluoro Guide Cv Line Right  04/29/2015  INDICATION: 82 year old female with a history of foot infection and requirement of antibiotics. EXAM: PICC LINE PLACEMENT WITH ULTRASOUND AND FLUOROSCOPIC GUIDANCE MEDICATIONS: None ANESTHESIA/SEDATION: None FLUOROSCOPY TIME:  Fluoroscopy Time: 0 minutes 12 seconds (1 mGy). COMPLICATIONS: None PROCEDURE: The patient was advised of the possible risks and complications and agreed to undergo the procedure. The patient was then brought to the angiographic suite for the procedure. The right arm was prepped with chlorhexidine, draped in the usual sterile fashion using maximum barrier technique (cap and mask, sterile gown, sterile gloves, large sterile sheet, hand hygiene and cutaneous antisepsis) and infiltrated locally with 1% Lidocaine. Ultrasound demonstrated patency of the right basilic vein, and this was documented with an image. Under real-time ultrasound guidance, this vein was accessed with a 21 gauge micropuncture  needle and image documentation was performed. A 0.018 wire was introduced in to the vein. Over this, a 5 [place] single lumen power injectable PICC was advanced to the superior SVC/right atrial junction. Fluoroscopy during the procedure and fluoro spot radiograph confirms appropriate catheter position. The catheter was flushed and covered with asterile dressing. Catheter length: 40 cm IMPRESSION: Status post right basilic vein PICC placement. 40 cm single-lumen power injectable PICC placed. Catheter ready for use. Signed, Dulcy Fanny. Earleen Newport, DO [facility] Electronically Signed   By: Corrie Mckusick D.O.   On: 04/29/2015 15:05   Ir US Guide Vasc Access Right  04/29/2015  INDICATION: 82 year old female with a history of foot infection and requirement of antibiotics. EXAM: PICC LINE PLACEMENT WITH ULTRASOUND AND FLUOROSCOPIC GUIDANCE MEDICATIONS: None ANESTHESIA/SEDATION: None FLUOROSCOPY TIME:  Fluoroscopy Time: 0 minutes 12 seconds (1 mGy). COMPLICATIONS: None PROCEDURE: The patient was advised of the possible risks and complications and agreed to undergo the procedure. The patient was then brought to the angiographic suite for the procedure. The right arm was prepped with chlorhexidine, draped in the usual sterile fashion using maximum barrier technique (cap and mask, sterile gown, sterile gloves, large sterile sheet, hand hygiene and cutaneous antisepsis) and infiltrated locally with 1% Lidocaine. Ultrasound demonstrated patency of the right basilic vein, and this was documented with an image. Under real-time ultrasound guidance, this  vein was accessed with a 21 gauge micropuncture needle and image documentation was performed. A 0.018 wire was introduced in to the vein. Over this, a 5 [place] single lumen power injectable PICC was advanced to the superior SVC/right atrial junction. Fluoroscopy during the procedure and fluoro spot radiograph confirms appropriate  catheter position. The catheter was flushed and covered with asterile dressing. Catheter length: 40 cm IMPRESSION: Status post right basilic vein PICC placement. 40 cm single-lumen power injectable PICC placed. Catheter ready for use. Signed, Dulcy Fanny. Earleen Newport, DO [facility] Electronically Signed   By: Corrie Mckusick D.O.   On: 04/29/2015 15:05   Dg Chest Port 1 View  04/10/2015  CLINICAL DATA:  Chronic nonproductive cough EXAM: PORTABLE CHEST 1 VIEW COMPARISON:  October 24, 2014 FINDINGS: There is no edema or consolidation. The heart size and pulmonary vascularity are normal. No adenopathy. No bone lesions. IMPRESSION: No edema or consolidation. Electronically Signed   By: Lowella Grip III M.D.   On: 04/10/2015 10:52   Dg Foot Complete Right  04/04/2015  CLINICAL DATA:  Diabetic foot wound. EXAM: RIGHT FOOT COMPLETE - 3+ VIEW COMPARISON:  08/08/2014 FINDINGS: Interval amputation of the great toe at the level of the distal metatarsal. There is erosive change of the medial cortex of the mid and proximal first metatarsal suggesting osteomyelitis. There is sclerotic periosteal reaction in this area consistent with chronic bony infection. Chronic fracture of the third metatarsal is unchanged. Prior amputation of the fourth toe is unchanged without evidence of osteomyelitis. IMPRESSION: Findings compatible with chronic osteomyelitis of the first metatarsal. Electronically Signed   By: Franchot Gallo M.D.   On: 04/04/2015 18:24   Saline is warmed  Bear hugger in place  Suspect PNA will fluff out when hydrated given hypoxia,    Medications  sodium chloride 0.9 % bolus 1,000 mL (not administered)  vancomycin (VANCOCIN) IVPB 1000 mg/200 mL premix (not administered)  piperacillin-tazobactam (ZOSYN) IVPB 3.375 g (not administered)  0.9 %  sodium chloride infusion (not administered)  these meds were cleared with pharmacy as patient already has had  them and there is very low cross reactivity with corn  Will also need tsh to ruled out mixedema coma as has h/o of hypothyroid and is hypothermic   Admit to tele inpatient per Dr. Blaine Hamper  I personally performed the services described in this documentation, which was scribed in my presence. The recorded information has been reviewed and is accurate.     Veatrice Kells, MD 05/03/15 [phone]

## 2015-05-03 NOTE — Progress Notes (Signed)
Patient seen and examined. Admitted after midnight secondary to AMS, hypoglycemia and hypothermia. Found to have sepsis from UTI. Patient responded well to IVF's and is on rocephin currently. Hemodynamically stable and with mentation back to baseline. In no distress. Please refer to H&P written by Dr. Blaine Hamper for further info/details.  Plan: -palliative care consult for advance directives and GOC requested -continue current antibiotics and supportive care -follow clinical response  -at discharge will return back to [facility]  [place] [phone]

## 2015-05-03 NOTE — ED Notes (Signed)
Attempted lab draw x 2 but unsuccessful. RN,Topher made aware.

## 2015-05-03 NOTE — H&P (Signed)
[facility] History and Physical  [name] [id number] DOB: [birth year] DOA: 05/03/2015  Referring physician: ED physician PCP: Jani Gravel, MD  Specialists:   Chief Complaint: Altered mental status and hypoglycemia  HPI: [name] is a 82 y.o. female with PMH of hypothyroidism, diastolic congestive heart failure, atrial flutter not on anticoagulants due to old age, dementia and high risk of fall, stroke, chronic kidney disease-stage IV, BPH, recent right transmetatarsal amputation due to chronic osteomyelitis, who presents with altered mental status and hypoglycemia.  Patient was recently hospitalized from 3/16 to 3.18 due to stroke. She also had right transmetatarsal amputation due to chronic osteomyelitis in that admission. She was discharged to nursing home for rehabilitation. She is getting Tigecycline by IV in SNF. Per report, pt was found to have AMS and hypoglycemia with blood sugar 45 in her nursing home. When I saw pt in ED, pt is not oriented x 3, could not provide any history. She moves extremities upon painful stimulus. She dose not seem to have cough, nausea, vomiting or diarrhea.  In ED, patient was found to have WBC 19.7, CBG 146 on arrival, positive urinalysis with large amount of leukocytes, hypothermia with temperature 94.7, oxygen desaturation to 86%, slightly bradycardia, stable renal function. Chest x-ray is negative for acute abnormalities. CT head is negative for acute intracranial abnormalities. Patient is admitted to inpatient for further intervention treatment.  EKG: Independently reviewed. QTC 461, no ischemic change.  Where does patient live? SNF  Can patient participate in ADLs? None  Review of Systems: Could not be reviewed due to altered mental status.  Allergy:  Allergies  Allergen Reactions  . Corn-Containing Products Other (See Comments)    Reaction:  Unknown   . Ephedrine Other (See Comments)    Reaction:  Unknown   . Morphine Other  (See Comments)    Reaction:  Difficulty waking up and talking with this medication.    . Rocephin [Ceftriaxone] Other (See Comments)    Possible anaphylaxis reaction as documented previously in patient's medical history     Past Medical History  Diagnosis Date  . Myocardial infarction ([place])   . Coronary artery disease   . Hypertension   . Dysrhythmia   . Peripheral vascular disease ([place])   . Hypothyroidism   . Type 2 diabetes mellitus with diabetic polyneuropathy, with long-term current use of insulin ([place])   . Presbyesophagus 04/23/2011  . Urinary retention with incomplete bladder emptying 04/23/2011  . UTI (lower urinary tract infection) 04/21/2011  . Diastolic CHF, chronic (HCC)     Grade I diastolic dysfunction on Echo 04/26/11  . Anaphylaxis 04/25/2011    Thought to be from Rocephin  . History of atrial flutter     ablation  . C. difficile colitis 04/17/2012  . Atrial flutter ([place]) 11/01/2012  . HCAP (healthcare-associated pneumonia) 10/21/2014  . Stroke [facility]) 2003    "lost the use of all of my right side"  . HOH (hard of hearing)   . CKD (chronic kidney disease), stage IV ([place])   . Expressive aphasia   . Hyperkalemia   . Hyponatremia   . Physical deconditioning   . Cellulitis of right foot   . Chronic osteomyelitis ([place])   . Chronic diastolic CHF (congestive heart failure) ([place])   . Neuropathy ([place])   . Acute blood loss anemia   . Cellulitis of right foot     Past Surgical History  Procedure Laterality Date  . Cholecystectomy    . Colonoscopy  01/15/2011    Procedure: COLONOSCOPY;  Surgeon: Beryle Beams, MD;  Location: [facility] ENDOSCOPY;  Service: Endoscopy;  Laterality: N/A;  . Amputation Right 04/14/2012    Procedure: AMPUTATION RAY;  Surgeon: Newt Minion, MD;  Location: [facility] ORS;  Service: Orthopedics;  Laterality: Right;  ankle block  . Cardiac electrophysiology mapping and ablation    . Nm myocar perf wall motion  01/23/2009    mild ischemia mid inferior & apical  inferior.  EF 75%  . Amputation Right 08/10/2014    Procedure: AMPUTATION RAY;  Surgeon: Mcarthur Rossetti, MD;  Location: [facility] ORS;  Service: Orthopedics;  Laterality: Right;  ankle block  . Appendectomy    . Shoulder surgery Right     "put back in place'  . Eye surgery Bilateral     Cararact  . I&d extremity Right 12/03/2014    Procedure: IRRIGATION AND DEBRIDEMENT RIGHT FOOT;  Surgeon: Mcarthur Rossetti, MD;  Location: [facility];  Service: Orthopedics;  Laterality: Right;  . Amputation Right 04/06/2015    Procedure: Transmetatarsal AMPUTATION RIGHT FOOT;  Surgeon: Mcarthur Rossetti, MD;  Location: [facility] ORS;  Service: Orthopedics;  Laterality: Right;    Social History:  reports that she quit smoking about 34 years ago. Her smoking use included Cigarettes. She has a 4 pack-year smoking history. She has never used smokeless tobacco. She reports that she drinks alcohol. She reports that she does not use illicit drugs.  Family History:  Family History  Problem Relation Age of Onset  . Cancer Sister   . Diabetes Mother   . Heart disease Father      Prior to Admission medications   Medication Sig Start Date End Date Taking? Authorizing Provider  acetaminophen (TYLENOL) 325 MG tablet Take 325-650 mg by mouth every 6 (six) hours as needed for mild pain, moderate pain, fever or headache.   Yes Historical Provider, MD  aspirin EC 325 MG tablet Take 325 mg by mouth daily. 04/12/15 05/13/15 Yes Historical Provider, MD  carvedilol (COREG) 6.25 MG tablet Take 6.25 mg by mouth 2 (two) times daily with a meal.    Yes Historical Provider, MD  diltiazem (CARDIZEM CD) 180 MG 24 hr capsule Take 180 mg by mouth at bedtime.    Yes Historical Provider, MD  gabapentin (NEURONTIN) 300 MG capsule Take 300 mg by mouth 3 (three) times daily.    Yes Ivan Anchors Love, PA-C  heparin flush 10 UNIT/ML SOLN injection Inject 50 Units into the vein every 12 (twelve) hours.   Yes Historical Provider, MD  insulin aspart  (NOVOLOG) 100 UNIT/ML injection Inject 0-12 Units into the skin 3 (three) times daily with meals as needed for high blood sugar. Pt uses as needed per sliding scale:    70-140:  0 units  141-180:  2 units  181-220:  4 units  221-260:  6 units  261-300:  8 units  301-340:  10 units  [id number]:  12 units   Yes Historical Provider, MD  insulin glargine (LANTUS) 100 UNIT/ML injection Inject 0.35 mLs (35 Units total) into the skin every morning. 04/08/15  Yes Modena Jansky, MD  isosorbide mononitrate (IMDUR) 30 MG 24 hr tablet Take 30 mg by mouth at bedtime.    Yes Historical Provider, MD  levothyroxine (SYNTHROID, LEVOTHROID) 175 MCG tablet Take 175 mcg by mouth daily before breakfast.   Yes Historical Provider, MD  Multiple Vitamin (MULTIVITAMIN WITH MINERALS) TABS tablet Take 1 tablet by mouth daily.  Yes Historical Provider, MD  Protein (PROCEL) POWD Take 2 scoop by mouth 2 (two) times daily.   Yes Historical Provider, MD  saccharomyces boulardii (FLORASTOR) 250 MG capsule Take 250 mg by mouth 2 (two) times daily.  04/28/15 06-03-15 Yes Historical Provider, MD  Sodium Chloride Flush (NORMAL SALINE FLUSH IV) Inject 10 mLs into the vein See admin instructions. [name] to use two times daily and between IV medications.   **  Use bacteriostatic saline if more than 62mL per day.   Yes Historical Provider, MD  Tamsulosin HCl (FLOMAX) 0.4 MG CAPS Take 0.4 mg by mouth daily.    Yes Historical Provider, MD  TIGECYCLINE IV Inject 50 mg into the vein every 12 (twelve) hours.  04/28/15 05/10/15 Yes Historical Provider, MD  vitamin E 400 UNIT capsule Take 400 Units by mouth daily.   Yes Historical Provider, MD    Physical Exam: Filed Vitals:   05/03/15 0300 05/03/15 0330 05/03/15 0439 05/03/15 0646  BP: 118/49 123/49 121/60 145/63  Pulse: 61 63 66 75  Temp:   98 F (36.7 C) 97.8 F (36.6 C)  TempSrc:   Rectal Oral  Resp: 12 14 13 16   SpO2: 92% 86% 96% 96%   General: Not in acute distress HEENT:        Eyes: PERRL, EOMI, no scleral icterus.       ENT: No discharge from the ears and nose, no pharynx injection, no tonsillar enlargement.        Neck: No JVD, no bruit, no mass felt. Heme: No neck lymph node enlargement. Cardiac: S1/S2, RRR, No murmurs, No gallops or rubs. Pulm: No rales, wheezing, rhonchi or rubs. Abd: Soft, nondistended, no organomegaly, BS present. Ext: No pitting leg edema bilaterally. 2+DP/PT pulse bilaterally. S/p of right foot transmetatarsal amputation, wrapped without training discharge. Musculoskeletal: No joint deformities, No joint redness or warmth, no limitation of ROM in spin. Skin: No rashes.  Neuro: not oriented X3, cranial nerves II-XII grossly intact, moves all extremities upon painful stimulus. Psych: Patient is not psychotic, no suicidal or hemocidal ideation.  Labs on Admission:  Basic Metabolic Panel:  Recent Labs Lab 04/29/15 05/03/15 0147 05/03/15 0553  NA 141 138 136  K 5.5* 4.9 5.0  CL  --  104 107  CO2  --   --  21*  GLUCOSE  --  221* 174*  BUN 45* 45* 51*  CREATININE 1.8* 1.40* 1.37*  CALCIUM  --   --  8.1*   Liver Function Tests:  Recent Labs Lab 04/29/15 05/03/15 0553  AST 9* 12*  ALT 6* 12*  ALKPHOS 71 67  BILITOT  --  0.3  PROT  --  6.1*  ALBUMIN  --  2.8*   No results for input(s): LIPASE, AMYLASE in the last 168 hours. No results for input(s): AMMONIA in the last 168 hours. CBC:  Recent Labs Lab 04/29/15 05/03/15 0139 05/03/15 0147 05/03/15 0553  WBC 9.5 19.7*  --  13.1*  NEUTROABS 5 16.5*  --   --   HGB 9.1* 10.6* 11.2* 9.5*  HCT 29* 32.2* 33.0* 29.3*  MCV  --  84.1  --  83.0  PLT 339 340  --  331   Cardiac Enzymes: No results for input(s): CKTOTAL, CKMB, CKMBINDEX, TROPONINI in the last 168 hours.  BNP (last 3 results)  Recent Labs  05/03/15 0553  BNP 47.2    ProBNP (last 3 results) No results for input(s): PROBNP in the last 8760 hours.  CBG:  Recent Labs Lab 05/03/15 0008 05/03/15 0434   GLUCAP 146* 179*    Radiological Exams on Admission: Dg Chest 2 View  05/03/2015  CLINICAL DATA:  Hypoglycemia.  Cough and congestion. EXAM: CHEST  2 VIEW COMPARISON:  04/10/2015 FINDINGS: There is a right upper extremity PICC line extending into the low SVC. There is mild vascular and interstitial prominence which is unchanged. No consolidation. No effusions. Unchanged mild cardiomegaly. IMPRESSION: Unchanged mild cardiomegaly and vascular/interstitial prominence. No consolidation or effusion. Electronically Signed   By: Andreas Newport M.D.   On: 05/03/2015 01:51   Ct Head Wo Contrast  05/03/2015  CLINICAL DATA:  Hypoglycemia.  Altered mental status. EXAM: CT HEAD WITHOUT CONTRAST TECHNIQUE: Contiguous axial images were obtained from the base of the skull through the vertex without intravenous contrast. COMPARISON:  04/10/2015 FINDINGS: There is no intracranial hemorrhage, mass or evidence of acute infarction. There is no extra-axial fluid collection. There is moderate generalized atrophy. There is white matter hypodensity, chronic and likely due to small vessel disease. No acute intracranial findings are evident. No interval change is evident. No bony abnormalities evident. The visible paranasal sinuses are clear. IMPRESSION: Generalized atrophy and chronic white matter hypodensities, likely small vessel disease. No acute intracranial finding. Electronically Signed   By: Andreas Newport M.D.   On: 05/03/2015 01:52    Assessment/Plan Principal Problem:   UTI (lower urinary tract infection) Active Problems:   HYPERTENSION, BENIGN ESSENTIAL   Hypothyroidism   Chronic kidney disease (CKD), stage IV (severe) (HCC)   Coronary atherosclerosis of native coronary artery   Diabetic neuropathy (HCC)   Diabetes mellitus with renal manifestations, uncontrolled (HCC)   Chronic diastolic CHF (congestive heart failure) (HCC)   History of CVA w/ right side deficits   S/P transmetatarsal amputation of  right foot ([place])   Dementia   Sepsis ([place])   Acute encephalopathy   Hypothermia   UTI and sepsis: Patient has positive urinalysis with large amount of leukocyte, consistent with  UTI. Patient is septic with leukocytosis, hypothermia. Hemodynamically stable.  - Admit to telemetry bed  - start primaxin. [name] also on Tigecycline  - Follow up results of urine and blood cx and amend antibiotic regimen if needed per sensitivity results - will get Procalcitonin and trend lactic acid levels per sepsis protocol. -  IVF: 2.5L of NS bolus in ED, followed by 75 cc/h -  check cortisol level and TSH due to hypothermia  -  warm blanket   Acute encephalopathy: Most likely due to UTI and sepsis. -Treat UTI as above -Frequent neuro check -hold oral meds  Hx of stroke -change oral ASA to per rectal  HTN: -Hold oral coreg and cardizem due to AMS -IV metoprolol with holding parameter  Hx of Afib/flutter: CHA2DS2-VASc Score is 6, needs oral anticoagulation,but patient is not on AC due to old age, dementia and high risk of fall. HR is well controled. -hold oral cardizem and coreg -IV metoprolol  Hypothyroidism: Last TSH was 0.551 on 04/10/15 -Continue home Synthroid, but change to IV, decreasing dose from 175 to 75 mcg daily -Check TSH  DM-II: Last A1c 11.9 on 04/11/15, poorly controled. Patient is taking Lantus at home -will hold off Lantus due to episode of hypoglycemia -SSI  S/P transmetatarsal amputation of right foot [facility]): due to chronic osteomyelitis -Continue IV Tigecycline -consult wound care  Chronic kidney disease (CKD), stage IV (severe) ([place]): Stable. Creatinine was 1.8 on 04/29/15, her creatinine is 1.4 on admission. -Follow-up  renal function, by BMP  DVT ppx:  SQ Lovenox  Code Status: Full code Family Communication: None at bed side.  Disposition Plan: Admit to inpatient   Date of Service 05/03/2015    Ivor Costa [facility] Pager [phone]  If 7PM-7AM, please  contact night-coverage [email or web address] Password [email or web address] 05/03/2015, 7:23 AM

## 2015-05-03 NOTE — Progress Notes (Signed)
Pharmacy Antibiotic Note  [name] is a 82 y.o. female admitted on 05/03/2015 with UTI / osteomyelitis.  Pharmacy has been consulted for Primaxin dosing. MD dosing Tigecycline (on med PTA).    Plan: Primaxin 250mg  IV q6h    Temp (24hrs), Avg:96.1 F (35.6 C), Min:94.7 F (34.8 C), Max:98 F (36.7 C)   Recent Labs Lab 04/29/15 05/03/15 0139 05/03/15 0147  WBC 9.5 19.7*  --   CREATININE 1.8*  --  1.40*    Estimated Creatinine Clearance: 25.3 mL/min (by C-G formula based on Cr of 1.4).    Allergies  Allergen Reactions  . Corn-Containing Products Other (See Comments)    Reaction:  Unknown   . Ephedrine Other (See Comments)    Reaction:  Unknown   . Morphine Other (See Comments)    Reaction:  Difficulty waking up and talking with this medication.    . Rocephin [Ceftriaxone] Other (See Comments)    Possible anaphylaxis reaction as documented previously in patient's medical history     Antimicrobials this admission: PTA Tigecycline >>   4/8 Primaxin >>    Dose adjustments this admission:    Microbiology results: 4/8 BCx:   4/8 UCx:     Thank you for allowing pharmacy to be a part of this patient's care.  Everette Rank, PharmD 05/03/2015 5:03 AM

## 2015-05-04 ENCOUNTER — Inpatient Hospital Stay (HOSPITAL_COMMUNITY): Payer: Medicare Other

## 2015-05-04 DIAGNOSIS — G459 Transient cerebral ischemic attack, unspecified: Secondary | ICD-10-CM

## 2015-05-04 DIAGNOSIS — I5032 Chronic diastolic (congestive) heart failure: Secondary | ICD-10-CM

## 2015-05-04 DIAGNOSIS — I482 Chronic atrial fibrillation, unspecified: Secondary | ICD-10-CM | POA: Insufficient documentation

## 2015-05-04 DIAGNOSIS — Z7189 Other specified counseling: Secondary | ICD-10-CM

## 2015-05-04 DIAGNOSIS — Z515 Encounter for palliative care: Secondary | ICD-10-CM | POA: Insufficient documentation

## 2015-05-04 DIAGNOSIS — N390 Urinary tract infection, site not specified: Secondary | ICD-10-CM | POA: Insufficient documentation

## 2015-05-04 LAB — URINE CULTURE: SPECIAL REQUESTS: NORMAL

## 2015-05-04 LAB — GLUCOSE, CAPILLARY
Glucose-Capillary: 104 mg/dL — ABNORMAL HIGH (ref 65–99)
Glucose-Capillary: 190 mg/dL — ABNORMAL HIGH (ref 65–99)

## 2015-05-04 MED ORDER — METOPROLOL TARTRATE 1 MG/ML IV SOLN
5.0000 mg | Freq: Three times a day (TID) | INTRAVENOUS | Status: DC
Start: 2015-05-04 — End: 2015-05-12
  Administered 2015-05-04 – 2015-05-12 (×24): 5 mg via INTRAVENOUS
  Filled 2015-05-04 (×28): qty 5

## 2015-05-04 MED ORDER — HYDRALAZINE HCL 20 MG/ML IJ SOLN
5.0000 mg | Freq: Four times a day (QID) | INTRAMUSCULAR | Status: DC | PRN
  Administered 2015-05-05 – 2015-05-10 (×4): 5 mg via INTRAVENOUS
  Filled 2015-05-04 (×4): qty 1

## 2015-05-04 MED ORDER — SODIUM CHLORIDE 0.9 % IV SOLN
250.0000 mg | Freq: Four times a day (QID) | INTRAVENOUS | Status: DC
  Administered 2015-05-04 (×3): 250 mg via INTRAVENOUS
  Filled 2015-05-04 (×5): qty 250

## 2015-05-04 MED ORDER — FLUCONAZOLE IN SODIUM CHLORIDE 100-0.9 MG/50ML-% IV SOLN
100.0000 mg | INTRAVENOUS | Status: DC
  Administered 2015-05-04: 100 mg via INTRAVENOUS
  Filled 2015-05-04 (×2): qty 50

## 2015-05-04 NOTE — Progress Notes (Signed)
Notified physician that patient gagging and vomiting after eating three bites of food.  I reported I will give Zofran for nausea and vomiting.  Physician orders Speech Therapy evaluation and Clear Liquid diet for patient as well as continue to control nausea.

## 2015-05-04 NOTE — Progress Notes (Signed)
This is a no charge note  Pt was started on Primaxin for UTI and sepsis on admission. Pharmacist called and told that urine culture is positive only for yeast.  -will d/c Primaxin -Start Diflucan per pharmacy  Ivor Costa, MD  [facility] Pager [phone]  If 7PM-7AM, please contact night-coverage [email or web address] Password [email or web address] 05/04/2015, 9:22 PM

## 2015-05-04 NOTE — Care Management Note (Signed)
After a medical alert stroke code was called at [facility], I spoke to the patient's nurse and primary hospitalist on phone from [facility]. 82 year old Patient with multiple medical comorbidities including atrial fibrillation, not considered a candidate for anticoagulation. Per nursing staff, she was noted to have altered mental status and was not following commands appropriately which led to the code stroke. By the time I called the nurse immediately following the stroke alert, patient started to follow commands. While on the phone, nurse reported that she was following commands, had no drift in her upper extremities and able to answer simple questions. At baseline she is nonambulatory for possible days, per nurse.  Discussed the case with Dr. Dyann Kief after that. He informed that after his examination, patient's neurological exam is nonfocal and grossly unchanged from her baseline. she is admitted with AMS, recent ostiomyelitis, s/p foot amputation. Palliative care consultation was also sought. Based on her current medical condition, and nonfocal neurological exam, Dr. Dyann Kief felt that her symptoms could possibly represent a TIA given her vascular risk factors. He felt that no neurological consultation is warranted at this time. He will call if consultation is needed in future.

## 2015-05-04 NOTE — Consult Note (Signed)
Consultation Note Date: 05/04/2015   Patient Name: [name]  DOB: 07/01/1919  MRN: [id number]  Age / Sex: 82 y.o., female  PCP: Jani Gravel, MD Referring Physician: Barton Dubois, MD  Reason for Consultation: Establishing goals of care    Clinical Assessment/Narrative:  82 year old lady with a past medical history significant for hypothyroidism, diastolic congestive heart failure, atrial flutter not on anticoagulation because of advanced age, reported history of dementia, stroke, stage IV chronic kidney disease, recent right transmetatarsal amputation due to chronic osteomyelitis. Patient was hospitalized from 3-16 through 3-18 cause of stroke. She was discharged to nursing home [facility] for rehabilitation. She is getting tigecycline IV in skilled nursing facility.  Patient was reported to have altered mental status and low blood sugars and was transported to the emergency department. Initially she was not oriented and could not provide any history. Further workup showed a simple urinary tract infection. Chest x-ray was negative for acute abnormalities. CT have negative for acute intracranial abnormalities. Patient was admitted to hospitalist service and given IV fluids also started on Rocephin.  In light of the patient's advanced age, multiple serious medical conditions, recent hospitalizations, palliative care consultation has been obtained for goals of care discussions.  Ms. Lonsdale is a very pleasant elderly lady sitting up in bed. She is awake and alert. She answers most questions appropriately. She is attempting to feed herself breakfast. She states that compared to when she came into the hospital, she actually feels better this morning. There is no family present at the bedside.   Brief life review performed. Patient states she is originally from [place], [place]. She has 2 cats. Patient's husband is deceased.  Patient's son is deceased. She has a daughter and 2 grandchildren. Daughter [name] at [phone] is her healthcare power of attorney agent. She also has a living will. CODE STATUS discussions undertaken. Patient would like some resuscitative measures such as IV fluids, IV antibiotics, some gentle procedures. She would not want CPR she would not want intubation and mechanical ventilation. DO NOT RESUSCITATE/DO NOT INTUBATE will be established. This has been discussed on the phone with the patient's daughter [name] who is also in agreement. The goals are not comfort only. Patient is likely to go back to [facility] to continue her rehabilitation towards the end of this hospitalization. Patient and daughter are accepting of palliative follow-up at skilled nursing facility rehabilitation.  Contacts/Participants in Discussion: Primary Decision Maker:     Relationship to Patient   HCPOA: yes   Daughter [name]  SUMMARY OF RECOMMENDATIONS: CODE STATUS now established as DO NOT RESUSCITATE/DO NOT INTUBATE Continue current scope of hospitalization, current treatments Skilled nursing facility rehabilitation with palliative services to follow-up after discharge.  Code Status/Advance Care Planning: DNR    Code Status Orders        Start     Ordered   05/04/15 1441  Do not attempt resuscitation (DNR)   Continuous    Question Answer Comment  In the event of cardiac or respiratory ARREST Do not call a "code blue"   In the event of cardiac or respiratory ARREST Do not perform Intubation, CPR, defibrillation or ACLS   In the event of cardiac or respiratory ARREST Use medication by any route, position, wound care, and other measures to relive pain and suffering. May use oxygen, suction and manual treatment of airway obstruction as needed for comfort.      05/04/15 1440    Code Status History    Date Active  Date Inactive Code Status Order ID Comments User Context   05/03/2015  4:53 AM 05/04/2015  2:40  PM Full Code [id number]  Ivor Costa, MD ED   04/10/2015  6:53 PM 04/12/2015 11:07 PM Full Code [id number]  Samella Parr, NP Inpatient   04/04/2015  9:56 PM 04/08/2015  6:23 PM Full Code [id number]  Norval Morton, MD ED   10/22/2014 11:27 AM 10/25/2014  8:06 PM DNR [id number]  Thurnell Lose, MD Inpatient   10/21/2014  5:31 PM 10/22/2014 11:27 AM Full Code [id number]  Robbie Lis, MD Inpatient   08/08/2014  5:20 PM 08/13/2014  6:08 PM Full Code [id number]  Theodis Blaze, MD Inpatient   11/18/2013 12:57 PM 11/21/2013  4:33 PM Full Code [id number]  Barton Dubois, MD Inpatient   08/28/2013 12:55 AM 08/30/2013  7:16 PM Full Code [id number]  Hosie Poisson, MD Inpatient   04/14/2012  7:40 PM 04/17/2012  5:41 PM Full Code [id number]  Newt Minion, MD Inpatient   04/12/2012 12:18 AM 04/14/2012  7:40 PM Full Code [id number]  Janell Quiet, MD Inpatient   04/21/2011  9:40 PM 04/27/2011  7:19 PM Full Code [id number]  Janae Bridgeman, RN Inpatient      Other Directives:Advanced Directive  Symptom Management:    As above  Palliative Prophylaxis:   Delirium Protocol    Psycho-social/Spiritual:  Support System: [place] Desire for further Chaplaincy support:no Additional Recommendations: Caregiving  Support/Resources  Prognosis: < 12 months  Discharge Planning: [facility] for rehab with Palliative care service follow-up   Chief Complaint/ Primary Diagnoses: Present on Admission:  . UTI (lower urinary tract infection) . Hypothyroidism . HYPERTENSION, BENIGN ESSENTIAL . Diabetic neuropathy ([place]) . Diabetes mellitus with renal manifestations, uncontrolled ([place]) . Dementia . Coronary atherosclerosis of native coronary artery . Chronic kidney disease (CKD), stage IV (severe) ([place]) . Chronic diastolic CHF (congestive heart failure) ([place]) . Sepsis ([place]) . Acute encephalopathy . Hypothermia  I have reviewed the medical record, interviewed the patient and family, and examined the patient. The following  aspects are pertinent.  Past Medical History  Diagnosis Date  . Myocardial infarction ([place])   . Coronary artery disease   . Hypertension   . Dysrhythmia   . Peripheral vascular disease ([place])   . Hypothyroidism   . Type 2 diabetes mellitus with diabetic polyneuropathy, with long-term current use of insulin ([place])   . Presbyesophagus 04/23/2011  . Urinary retention with incomplete bladder emptying 04/23/2011  . UTI (lower urinary tract infection) 04/21/2011  . Diastolic CHF, chronic ([facility])     Grade I diastolic dysfunction on Echo 04/26/11  . Anaphylaxis 04/25/2011    Thought to be from Rocephin  . History of atrial flutter     ablation  . C. difficile colitis 04/17/2012  . Atrial flutter ([place]) 11/01/2012  . HCAP (healthcare-associated pneumonia) 10/21/2014  . Stroke [facility]) 2003    "lost the use of all of my right side"  . HOH (hard of hearing)   . CKD (chronic kidney disease), stage IV ([place])   . Expressive aphasia   . Hyperkalemia   . Hyponatremia   . Physical deconditioning   . Cellulitis of right foot   . Chronic osteomyelitis ([place])   . Chronic diastolic CHF (congestive heart failure) ([place])   . Neuropathy ([place])   . Acute blood loss anemia   . Cellulitis of right foot    Social History   Social History  . Marital Status: Widowed  Spouse Name: N/A  . Number of Children: N/A  . Years of Education: N/A   Social History Main Topics  . Smoking status: Former Smoker -- 0.10 packs/day for 40 years    Types: Cigarettes    Quit date: 04/20/1981  . Smokeless tobacco: Never Used  . Alcohol Use: Yes     Comment: hx social  . Drug Use: No  . Sexual Activity: Not Currently   Other Topics Concern  . None   Social History Narrative   Family History  Problem Relation Age of Onset  . Cancer Sister   . Diabetes Mother   . Heart disease Father    Scheduled Meds: . aspirin  150 mg Rectal Daily  . enoxaparin (LOVENOX) injection  30 mg Subcutaneous Q24H  . heparin flush  50 Units  Intravenous Q12H  . imipenem-cilastatin  250 mg Intravenous Q6H  . levothyroxine  75 mcg Intravenous QAC breakfast  . metoprolol  2.5 mg Intravenous 3 times per day  . sodium chloride  2,500 mL Intravenous Once  . sodium chloride flush  3 mL Intravenous Q12H  . tigecycline (TYGACIL) IVPB  50 mg Intravenous Q12H   Continuous Infusions: . sodium chloride 75 mL/hr at 05/04/15 1307   PRN Meds:.acetaminophen **OR** acetaminophen, ondansetron (ZOFRAN) IV Medications Prior to Admission:  Prior to Admission medications   Medication Sig Start Date End Date Taking? Authorizing Provider  acetaminophen (TYLENOL) 325 MG tablet Take 325-650 mg by mouth every 6 (six) hours as needed for mild pain, moderate pain, fever or headache.   Yes Historical Provider, MD  aspirin EC 325 MG tablet Take 325 mg by mouth daily. 04/12/15 05/13/15 Yes Historical Provider, MD  carvedilol (COREG) 6.25 MG tablet Take 6.25 mg by mouth 2 (two) times daily with a meal.    Yes Historical Provider, MD  diltiazem (CARDIZEM CD) 180 MG 24 hr capsule Take 180 mg by mouth at bedtime.    Yes Historical Provider, MD  gabapentin (NEURONTIN) 300 MG capsule Take 300 mg by mouth 3 (three) times daily.    Yes Ivan Anchors Love, PA-C  heparin flush 10 UNIT/ML SOLN injection Inject 50 Units into the vein every 12 (twelve) hours.   Yes Historical Provider, MD  insulin aspart (NOVOLOG) 100 UNIT/ML injection Inject 0-12 Units into the skin 3 (three) times daily with meals as needed for high blood sugar. Pt uses as needed per sliding scale:    70-140:  0 units  141-180:  2 units  181-220:  4 units  221-260:  6 units  261-300:  8 units  301-340:  10 units  [phone]:  12 units   Yes Historical Provider, MD  insulin glargine (LANTUS) 100 UNIT/ML injection Inject 0.35 mLs (35 Units total) into the skin every morning. 04/08/15  Yes Modena Jansky, MD  isosorbide mononitrate (IMDUR) 30 MG 24 hr tablet Take 30 mg by mouth at bedtime.    Yes Historical  Provider, MD  levothyroxine (SYNTHROID, LEVOTHROID) 175 MCG tablet Take 175 mcg by mouth daily before breakfast.   Yes Historical Provider, MD  Multiple Vitamin (MULTIVITAMIN WITH MINERALS) TABS tablet Take 1 tablet by mouth daily.   Yes Historical Provider, MD  Protein (PROCEL) POWD Take 2 scoop by mouth 2 (two) times daily.   Yes Historical Provider, MD  saccharomyces boulardii (FLORASTOR) 250 MG capsule Take 250 mg by mouth 2 (two) times daily.  04/28/15 20-May-2015 Yes Historical Provider, MD  Sodium Chloride Flush (NORMAL SALINE FLUSH IV) Inject  10 mLs into the vein See admin instructions. [name] to use two times daily and between IV medications.   **  Use bacteriostatic saline if more than 57mL per day.   Yes Historical Provider, MD  Tamsulosin HCl (FLOMAX) 0.4 MG CAPS Take 0.4 mg by mouth daily.    Yes Historical Provider, MD  TIGECYCLINE IV Inject 50 mg into the vein every 12 (twelve) hours.  04/28/15 05/10/15 Yes Historical Provider, MD  vitamin E 400 UNIT capsule Take 400 Units by mouth daily.   Yes Historical Provider, MD   Allergies  Allergen Reactions  . Corn-Containing Products Other (See Comments)    Reaction:  Unknown   . Ephedrine Other (See Comments)    Reaction:  Unknown   . Morphine Other (See Comments)    Reaction:  Difficulty waking up and talking with this medication.    . Rocephin [Ceftriaxone] Other (See Comments)    Possible anaphylaxis reaction as documented previously in patient's medical history     Review of Systems Positive for generalized weakness Physical Exam Elderly lady resting in bed sitting upright trying to eat breakfast this morning No acute distress S1-S2 Lungs clear Abdomen soft nontender Extremities: No edema bilaterally status post right foot transmetatarsal amputation dressing without any discharge Patient is awake alert oriented she answers all questions appropriately. Vital Signs: BP 161/132 mmHg  Pulse 76  Temp(Src) 98.1 F (36.7 C) (Oral)   Resp 16  Wt 71.668 kg (158 lb)  SpO2 97%  SpO2: SpO2: 97 % O2 Device:SpO2: 97 % O2 Flow Rate: .   IO: Intake/output summary:  Intake/Output Summary (Last 24 hours) at 05/04/15 1442 Last data filed at 05/04/15 1200  Gross per 24 hour  Intake   1785 ml  Output      0 ml  Net   1785 ml    LBM: Last BM Date: 05/03/15 Baseline Weight: Weight: 29.7 kg (65 lb 7.6 oz) (See Progress Note.) Most recent weight: Weight: 71.668 kg (158 lb)      Palliative Assessment/Data:  Flowsheet Rows        Most Recent Value   Intake Tab    Referral Department  Hospitalist   Unit at Time of Referral  Med/Surg Unit   Palliative Care Primary Diagnosis  Neurology   Palliative Care Type  New Palliative care   Reason for referral  Clarify Goals of Care   Date first seen by Palliative Care  05/04/15   Clinical Assessment    Palliative Performance Scale Score  30%   Pain Max last 24 hours  4   Pain Min Last 24 hours  3   Dyspnea Max Last 24 Hours  4   Dyspnea Min Last 24 hours  3   Psychosocial & Spiritual Assessment    Palliative Care Outcomes    Patient/Family meeting held?  Yes   Who was at the meeting?  patient, daughter over the phone.    Palliative Care follow-up planned  No      Additional Data Reviewed:  CBC:    Component Value Date/Time   WBC 13.1* 05/03/2015 0553   WBC 9.5 04/29/2015   HGB 9.5* 05/03/2015 0553   HCT 29.3* 05/03/2015 0553   PLT 331 05/03/2015 0553   MCV 83.0 05/03/2015 0553   NEUTROABS 16.5* 05/03/2015 0139   NEUTROABS 5 04/29/2015   LYMPHSABS 1.8 05/03/2015 0139   MONOABS 1.1* 05/03/2015 0139   EOSABS 0.3 05/03/2015 0139   BASOSABS 0.1 05/03/2015 0139  Comprehensive Metabolic Panel:    Component Value Date/Time   NA 136 05/03/2015 0553   NA 141 04/29/2015   K 5.0 05/03/2015 0553   CL 107 05/03/2015 0553   CO2 21* 05/03/2015 0553   BUN 51* 05/03/2015 0553   BUN 45* 04/29/2015   CREATININE 1.37* 05/03/2015 0553   CREATININE 1.8* 04/29/2015   GLUCOSE  174* 05/03/2015 0553   CALCIUM 8.1* 05/03/2015 0553   AST 12* 05/03/2015 0553   ALT 12* 05/03/2015 0553   ALKPHOS 67 05/03/2015 0553   BILITOT 0.3 05/03/2015 0553   PROT 6.1* 05/03/2015 0553   ALBUMIN 2.8* 05/03/2015 0553     Time In:  9 Time Out: 10    Time Total:  60 min  Greater than 50%  of this time was spent counseling and coordinating care related to the above assessment and plan.  Signed by: Loistine Chance, MD [place], MD  05/04/2015, 2:42 PM  Please contact Palliative Medicine Team phone at [phone] for questions and concerns.

## 2015-05-04 NOTE — Progress Notes (Signed)
Arrived to patient room to assist Nurse Tech for change of linens for incontinent episode of patient in bed.  Patient lethargic, slight left mouth droop, and does not respond, answer questions, or follows directions like morning assessment.  Notified Charge Nurse, called Rapid Response, notified physician.  Charge Nurse and Rapid Response team in room.  Vital signs obtained, CBG obtained.  Code Stroke called.  Patient being taken downstairs for CT of head.

## 2015-05-04 NOTE — Progress Notes (Signed)
Physician at bedside and [place] at bedside.  Patient is more alert and oriented.  Patient is answering questions, squeezing fingers, holding up arms, following directions from physician, and alert as this morning's assessment.

## 2015-05-04 NOTE — Progress Notes (Signed)
OT Cancellation Note  Patient Details Name: BELOVED COBIA MRN: [id number] DOB: [birth year]   Cancelled Treatment:    Reason Eval/Treat Not Completed: Medical issues which prohibited therapy. Patient not in room. Per nurse, pt had a Code Stroke. Not medically ready for OT evaluation. Will check back at a later time.  Giah Fickett A 05/04/2015, 11:02 AM

## 2015-05-04 NOTE — Progress Notes (Signed)
Pharmacy Antibiotic Note  [name] is a 82 y.o. female admitted on 05/03/2015 with UTI / osteomyelitis.  Pharmacy has been consulted for Primaxin dosing. MD dosing Tigecycline (on med PTA).  Urine culture growing yeast; therefore Primaxin has been D/C'ed and pharmacy consulted to dose Fluconazole for UTI. Patient noted to still be unable to take PO meds, so will give fluconazole via IV route  Plan: Fluconazole 100mg  IV q24h   Weight: 158 lb (71.668 kg)  Temp (24hrs), Avg:98.3 F (36.8 C), Min:98.1 F (36.7 C), Max:98.7 F (37.1 C)   Recent Labs Lab 04/29/15 05/03/15 0139 05/03/15 0147 05/03/15 0553 05/03/15 0914  WBC 9.5 19.7*  --  13.1*  --   CREATININE 1.8*  --  1.40* 1.37*  --   LATICACIDVEN  --   --   --  0.8 0.9    Estimated Creatinine Clearance: 25.9 mL/min (by C-G formula based on Cr of 1.37).    Allergies  Allergen Reactions  . Corn-Containing Products Other (See Comments)    Reaction:  Unknown   . Ephedrine Other (See Comments)    Reaction:  Unknown   . Morphine Other (See Comments)    Reaction:  Difficulty waking up and talking with this medication.    . Rocephin [Ceftriaxone] Other (See Comments)    Possible anaphylaxis reaction as documented previously in patient's medical history     Antimicrobials this admission: PTA Tigecycline >>   4/8 Primaxin >>  4/9 4/9 Fluconazole >>  Dose adjustments this admission:    Microbiology results: 4/8 BCx:  NG x 1 day 4/8 UCx:   > 100,000 yeast (final)  Thank you for allowing pharmacy to be a part of this patient's care.  Everette Rank, PharmD 05/04/2015 9:32 PM

## 2015-05-04 NOTE — Significant Event (Signed)
Rapid Response Event Note  Overview: Time Called: 1055 Event Type: Neurologic  Initial Focused Assessment: Pt lying in bed, speech is slow, weak grip bil,MAE, pupils about 65mm with sluggish reaction.  Slight facial droop on lt side noted .  Bedside RN reports pt is different than she was earlier, not as alert.   Interventions: CBG 191, placed on monitor with SR, BP 194/74 HR 74 R 23  Sat 98% .  HOB lowered.  Taken to CT scan.    Event Summary:1130 AM , Dr Dyann Kief in rm, CT with no acute findings, probable TIA.  Pt now at baseline, cont to say she wants to go back to [facility].  Will cont to monitor on floor.    at      at          [place], Jae Dire

## 2015-05-04 NOTE — Progress Notes (Signed)
Received call from Dr. Autumn Patty reporting No Acute Findings from CT of Head.  Notifying physician.

## 2015-05-04 NOTE — Progress Notes (Signed)
Utilization review completed.  

## 2015-05-04 NOTE — Consult Note (Signed)
WOC wound consult note Reason for Consult:need orders for topical wound care.  Patient is s/p trans met head amputation. Wound type: surgical Pressure Ulcer POA: No Measurement: 2cm x 0.8cm x 0.4cm Wound bed: red, moist and granulating Drainage (amount, consistency, odor) scant light yellow to serous Periwound: intact with evidence of wound healing.  Incision line is closely approximated. There is an atypical, circular elevation of fatty tissue on the right medial LE.  It is not warm or indurated, does not appear to be painful tot he patient. Dressing procedure/placement/frequency: I will implement a conservative POC to include cleansing of the surgical site and wound twice daily and dressing with a saline dressing topped with dry then with sustained mild compression via elastic bandages.  Bilateral heels are provided with pressure redistribution heel boots to prevent pressure injuries. [place] nursing team will not follow, but will remain available to this patient, the nursing and medical teams.  Please re-consult if needed. Thanks, Maudie Flakes, MSN, RN, [place], Arther Abbott  Pager# [phone]

## 2015-05-04 NOTE — Progress Notes (Signed)
[facility] PROGRESS NOTE  [name] [id number] DOB: [birth year] DOA: 05/03/2015 PCP: Jani Gravel, MD  Assessment/Plan: UTI and sepsis: Patient has positive urinalysis with large amount of leukocyte, consistent with UTI. Patient is septic with leukocytosis, hypothermia and encephalopathy.  -continue gentle IVF's -follow cx data -continue primaxin and also tigecycline for her MRSA osteomyelitis process. -follow clinical response -WBC's trending down, no fever or hypothermia anymore. Mentation back to baseline according to family  Acute encephalopathy: Most likely due to UTI and sepsis. -Treating UTI as above -Follow clinical response   Hx of stroke/TIA -continue ASA -recent admission for TIA -completed work up approx 3 weeks ago -discussed with neurology and will monitor for now -patient is back to baseline -will consider using plavix and low dose ASA when able to take PO  HTN: -continue IV metoprolol with holding parameter  Hx of Afib/flutter:  -CHA2DS2-VASc Score is 6, needs oral anticoagulation, but patient is not on [facility] due to old age, dementia and high risk of fall.  -HR is well controled. -on IV metoprolol -ASA for prevention  Dysphagia -will ask SPL to assess -CLD and IV meds as much as possible for now  Hypothyroidism:  -TSH WNL -will continue synthroid -currently IV, given concerns for dysphagia  DM-II: Last A1c 11.9 on 04/11/15, poorly controled. Patient is taking Lantus at home -will continue SSI -will attempt to resume low dose lantus in am  S/P transmetatarsal amputation of right foot [facility]): due to chronic osteomyelitis -Continue IV Tigecycline -consult wound care for dressing instructions and care  Chronic kidney disease (CKD), stage IV (severe) ([place]): Stable.  -Creatinine is 1.4 on admission. -Follow-up renal function intermittently  Code Status: DNR Family Communication: daughter over the phone  Disposition Plan: back to SNF for further  treatment and rehab when medically stable. In 1-2 days.   Consultants:  Neurology curbside (during acute event of TIA)  Palliative Care  Procedures:  See below for x-ray reports   Antibiotics:  primaxin  tigecycline    HPI/Subjective: Afebrile, with event overnight of possible TIA. Resolved by the time of my exam and with neg CT scan of her head. No CP, no SOB and no nausea or vomiting. Patient asking to go to [facility]   Objective: Filed Vitals:   05/04/15 1136 05/04/15 1430  BP: 178/99 161/132  Pulse:  76  Temp:  98.1 F (36.7 C)  Resp:  16    Intake/Output Summary (Last 24 hours) at 05/04/15 1824 Last data filed at 05/04/15 1200  Gross per 24 hour  Intake   1785 ml  Output      0 ml  Net   1785 ml   Filed Weights   05/03/15 0745 05/04/15 0300  Weight: 29.7 kg (65 lb 7.6 oz) 71.668 kg (158 lb)    Exam:   General: afebrile, and without CP or SOB. Patient overnight doing ok, but experienced acute episode of confusion, dysarthria, left facial droop and right side weakness. Event witnessed by nurse and rapid response. Code stroke called and patient had CT scan of her head. Results of scan neg and on my evaluation patient was completely back to normal; including MS 5/5, no dysarthria and oriented X3.  Cardiovascular: rate controlled. No rubs or gallops, no JVD  Respiratory: good air movement, no wheezing, no crackles  Abdomen: soft, NT, ND, positive BS  Musculoskeletal: RLE with clean dressing and healing wound on her right foot, from recent transmetatarsal amputation. Patient LLE w/o edema or cyanosis  Neurology: no focal deficit appreciated.  Data Reviewed: Basic Metabolic Panel:  Recent Labs Lab 04/29/15 05/03/15 0147 05/03/15 0553  NA 141 138 136  K 5.5* 4.9 5.0  CL  --  104 107  CO2  --   --  21*  GLUCOSE  --  221* 174*  BUN 45* 45* 51*  CREATININE 1.8* 1.40* 1.37*  CALCIUM  --   --  8.1*   Liver Function Tests:  Recent Labs Lab 04/29/15  05/03/15 0553  AST 9* 12*  ALT 6* 12*  ALKPHOS 71 67  BILITOT  --  0.3  PROT  --  6.1*  ALBUMIN  --  2.8*   CBC:  Recent Labs Lab 04/29/15 05/03/15 0139 05/03/15 0147 05/03/15 0553  WBC 9.5 19.7*  --  13.1*  NEUTROABS 5 16.5*  --   --   HGB 9.1* 10.6* 11.2* 9.5*  HCT 29* 32.2* 33.0* 29.3*  MCV  --  84.1  --  83.0  PLT 339 340  --  331   BNP (last 3 results)  Recent Labs  05/03/15 0553  BNP 47.2   CBG:  Recent Labs Lab 05/03/15 0008 05/03/15 0434 05/04/15 0017 05/04/15 1050  GLUCAP 146* 179* 104* 190*    Recent Results (from the past 240 hour(s))  Urine culture     Status: Abnormal   Collection Time: 05/03/15  2:55 AM  Result Value Ref Range Status   Specimen Description URINE, CATHETERIZED  Final   Special Requests Normal  Final   Culture >=100,000 COLONIES/mL YEAST (A)  Final   Report Status 05/04/2015 FINAL  Final  Blood culture (routine x 2)     Status: None (Preliminary result)   Collection Time: 05/03/15  4:03 AM  Result Value Ref Range Status   Specimen Description BLOOD RIGHT HAND  Final   Special Requests BOTTLES DRAWN AEROBIC AND ANAEROBIC 5CC  Final   Culture   Final    NO GROWTH 1 DAY Performed at [facility]    Report Status PENDING  Incomplete  Blood culture (routine x 2)     Status: None (Preliminary result)   Collection Time: 05/03/15  4:09 AM  Result Value Ref Range Status   Specimen Description BLOOD LEFT HAND  Final   Special Requests BOTTLES DRAWN AEROBIC AND ANAEROBIC 5CC  Final   Culture   Final    NO GROWTH 1 DAY Performed at [facility]    Report Status PENDING  Incomplete     Studies: Dg Chest 2 View  05/03/2015  CLINICAL DATA:  Hypoglycemia.  Cough and congestion. EXAM: CHEST  2 VIEW COMPARISON:  04/10/2015 FINDINGS: There is a right upper extremity PICC line extending into the low SVC. There is mild vascular and interstitial prominence which is unchanged. No consolidation. No effusions. Unchanged mild  cardiomegaly. IMPRESSION: Unchanged mild cardiomegaly and vascular/interstitial prominence. No consolidation or effusion. Electronically Signed   By: Andreas Newport M.D.   On: 05/03/2015 01:51   Ct Head Wo Contrast  05/04/2015  CLINICAL DATA:  Sudden onset of altered mental status approximately 45 minutes ago. EXAM: CT HEAD WITHOUT CONTRAST TECHNIQUE: Contiguous axial images were obtained from the base of the skull through the vertex without intravenous contrast. COMPARISON:  05/03/2015 FINDINGS: The ventricles are normal in configuration. There is ventricular and sulcal enlargement reflecting moderate atrophy. No hydrocephalus. There are no parenchymal masses or mass effect. There is no evidence of a recent cortical infarct. Patchy white matter hypoattenuation  is noted, stable from the prior exam, consistent with mild chronic microvascular ischemic change. There are no extra-axial masses or abnormal fluid collections. There is no intracranial hemorrhage. The visualized sinuses are clear. IMPRESSION: 1. No acute intracranial abnormalities. No evidence of a recent cortical infarct or of intracranial hemorrhage. 2. Atrophy and mild chronic microvascular ischemic change. These results were called by telephone at the time of interpretation on 05/04/2015 at 11:24 am to nurse Alease Medina, who verbally acknowledged these results. Electronically Signed   By: Lajean Manes M.D.   On: 05/04/2015 11:25   Ct Head Wo Contrast  05/03/2015  CLINICAL DATA:  Hypoglycemia.  Altered mental status. EXAM: CT HEAD WITHOUT CONTRAST TECHNIQUE: Contiguous axial images were obtained from the base of the skull through the vertex without intravenous contrast. COMPARISON:  04/10/2015 FINDINGS: There is no intracranial hemorrhage, mass or evidence of acute infarction. There is no extra-axial fluid collection. There is moderate generalized atrophy. There is white matter hypodensity, chronic and likely due to small vessel disease. No acute  intracranial findings are evident. No interval change is evident. No bony abnormalities evident. The visible paranasal sinuses are clear. IMPRESSION: Generalized atrophy and chronic white matter hypodensities, likely small vessel disease. No acute intracranial finding. Electronically Signed   By: Andreas Newport M.D.   On: 05/03/2015 01:52    Scheduled Meds: . aspirin  150 mg Rectal Daily  . enoxaparin (LOVENOX) injection  30 mg Subcutaneous Q24H  . heparin flush  50 Units Intravenous Q12H  . imipenem-cilastatin  250 mg Intravenous Q6H  . levothyroxine  75 mcg Intravenous QAC breakfast  . metoprolol  2.5 mg Intravenous 3 times per day  . sodium chloride  2,500 mL Intravenous Once  . sodium chloride flush  3 mL Intravenous Q12H  . tigecycline (TYGACIL) IVPB  50 mg Intravenous Q12H   Continuous Infusions: . sodium chloride 75 mL/hr at 05/04/15 1307    Principal Problem:   UTI (lower urinary tract infection) Active Problems:   HYPERTENSION, BENIGN ESSENTIAL   Hypothyroidism   Chronic kidney disease (CKD), stage IV (severe) ([facility])   Coronary atherosclerosis of native coronary artery   Diabetic neuropathy (H[facility])   Diabetes mellitus with renal manifestations, uncontrolled (H[facility])   Chronic diastolic CHF (congestive heart failure) ([place])   History of CVA w/ right side deficits   S/P transmetatarsal amputation of right foot ([place])   Dementia   Sepsis ([place])   Acute encephalopathy   Hypothermia   Encounter for palliative care   Goals of care, counseling/discussion    Time spent: 45 minutes (> 50% of the time dedicated to face to face evaluation, coordination care and discussion/update to her daughter about condition)    Barton Dubois  [facility] Pager [phone]. If 7PM-7AM, please contact night-coverage at [email or web address], password [facility] 05/04/2015, 6:24 PM  LOS: 1 day

## 2015-05-05 DIAGNOSIS — R197 Diarrhea, unspecified: Secondary | ICD-10-CM | POA: Insufficient documentation

## 2015-05-05 LAB — GLUCOSE, CAPILLARY
Glucose-Capillary: 244 mg/dL — ABNORMAL HIGH (ref 65–99)
Glucose-Capillary: 297 mg/dL — ABNORMAL HIGH (ref 65–99)
Glucose-Capillary: 204 mg/dL — ABNORMAL HIGH (ref 65–99)

## 2015-05-05 LAB — BASIC METABOLIC PANEL
ANION GAP: 6 (ref 5–15)
BUN: 32 mg/dL — AB (ref 6–20)
CO2: 23 mmol/L (ref 22–32)
Calcium: 8.3 mg/dL — ABNORMAL LOW (ref 8.9–10.3)
Chloride: 108 mmol/L (ref 101–111)
Creatinine, Ser: 1.21 mg/dL — ABNORMAL HIGH (ref 0.44–1.00)
GFR, EST AFRICAN AMERICAN: 43 mL/min — AB (ref >60)
GFR, EST NON AFRICAN AMERICAN: 37 mL/min — AB (ref >60)
Glucose, Bld: 212 mg/dL — ABNORMAL HIGH (ref 65–99)
POTASSIUM: 4.5 mmol/L (ref 3.5–5.1)
SODIUM: 137 mmol/L (ref 135–145)

## 2015-05-05 LAB — CBC
HCT: 32.2 % — ABNORMAL LOW (ref 36.0–46.0)
Hemoglobin: 10.6 g/dL — ABNORMAL LOW (ref 12.0–15.0)
MCH: 26.8 pg (ref 26.0–34.0)
MCHC: 32.9 g/dL (ref 30.0–36.0)
MCV: 81.3 fL (ref 78.0–100.0)
PLATELETS: 356 10*3/uL (ref 150–400)
RBC: 3.96 MIL/uL (ref 3.87–5.11)
RDW: 14.6 % (ref 11.5–15.5)
WBC: 9.7 10*3/uL (ref 4.0–10.5)

## 2015-05-05 MED ORDER — SODIUM CHLORIDE 0.9 % IV SOLN
250.0000 mg | Freq: Four times a day (QID) | INTRAVENOUS | Status: AC
  Administered 2015-05-05 – 2015-05-10 (×20): 250 mg via INTRAVENOUS
  Filled 2015-05-05 (×20): qty 250

## 2015-05-05 MED ORDER — SACCHAROMYCES BOULARDII 250 MG PO CAPS
250.0000 mg | ORAL_CAPSULE | Freq: Two times a day (BID) | ORAL | Status: DC
  Administered 2015-05-05 – 2015-05-10 (×5): 250 mg via ORAL
  Filled 2015-05-05 (×15): qty 1

## 2015-05-05 MED ORDER — SODIUM CHLORIDE 0.9% FLUSH
10.0000 mL | INTRAVENOUS | Status: DC | PRN
  Administered 2015-05-05 – 2015-05-08 (×2): 10 mL
  Filled 2015-05-05 (×2): qty 40

## 2015-05-05 MED ORDER — INSULIN ASPART 100 UNIT/ML ~~LOC~~ SOLN
0.0000 [IU] | Freq: Three times a day (TID) | SUBCUTANEOUS | Status: DC
  Administered 2015-05-05 – 2015-05-06 (×2): 5 [IU] via SUBCUTANEOUS
  Administered 2015-05-06 (×2): 3 [IU] via SUBCUTANEOUS
  Administered 2015-05-07: 7 [IU] via SUBCUTANEOUS
  Administered 2015-05-07: 1 [IU] via SUBCUTANEOUS
  Administered 2015-05-07: 5 [IU] via SUBCUTANEOUS
  Administered 2015-05-08 (×2): 3 [IU] via SUBCUTANEOUS
  Administered 2015-05-08: 5 [IU] via SUBCUTANEOUS
  Administered 2015-05-09: 3 [IU] via SUBCUTANEOUS
  Administered 2015-05-09 – 2015-05-10 (×3): 5 [IU] via SUBCUTANEOUS
  Administered 2015-05-10: 2 [IU] via SUBCUTANEOUS
  Administered 2015-05-11: 1 [IU] via SUBCUTANEOUS
  Administered 2015-05-11: 2 [IU] via SUBCUTANEOUS
  Administered 2015-05-11: 3 [IU] via SUBCUTANEOUS
  Administered 2015-05-12: 2 [IU] via SUBCUTANEOUS

## 2015-05-05 NOTE — Evaluation (Signed)
Occupational Therapy Evaluation Patient Details Name: [name] MRN: [id number] DOB: November 02, 1919 Today's Date: 05/05/2015    History of Present Illness 82 yo female admitted to [facility] with hyopglycemia and altered mental status.    Clinical Impression   Pt admitted with above. She demonstrates the below listed deficits and will benefit from continued OT to maximize safety and independence with BADLs.  Pt presents to OT with generalized weakness, impaired balance, and impaired cognition.  She currently requires max A for ADLs.  Recommend return to SNF for rehab as medical issues allow.       Follow Up Recommendations  SNF;Supervision/Assistance - 24 hour    Equipment Recommendations  None recommended by OT    Recommendations for Other Services       Precautions / Restrictions Precautions Precautions: Fall Precaution Comments: contact precautions Required Braces or Orthoses: Other Brace/Splint Restrictions Weight Bearing Restrictions: Yes RLE Weight Bearing: Weight bearing as tolerated      Mobility Bed Mobility Overal bed mobility: Needs Assistance Bed Mobility: Rolling;Supine to Sit;Sit to Supine Rolling: Max assist   Supine to sit: Mod assist Sit to supine: Mod assist   General bed mobility comments: {Pt requires assist to initiate movement and to move LEs onto and off the bed   Transfers                 General transfer comment: did not attempt due to incontinence     Balance Overall balance assessment: Needs assistance Sitting-balance support: Feet supported Sitting balance-Leahy Scale: Fair                                      ADL Overall ADL's : Needs assistance/impaired Eating/Feeding: Minimal assistance;Bed level   Grooming: Minimal assistance;Bed level   Upper Body Bathing: Bed level;Maximal assistance   Lower Body Bathing: Total assistance;Bed level   Upper Body Dressing : Total assistance;Bed level;Sitting   Lower  Body Dressing: Total assistance;Bed level   Toilet Transfer: Total assistance Toilet Transfer Details (indicate cue type and reason): Unable to attempt due to incontinence  Toileting- Clothing Manipulation and Hygiene: Total assistance;Bed level Toileting - Clothing Manipulation Details (indicate cue type and reason): Pt incontinent of urine while sitting EOB.  SHe was returned to supine for peri care              Vision     Perception     Praxis      Pertinent Vitals/Pain Pain Assessment: No/denies pain     Hand Dominance Right   Extremity/Trunk Assessment Upper Extremity Assessment Upper Extremity Assessment: Generalized weakness   Lower Extremity Assessment Lower Extremity Assessment: Defer to PT evaluation   Cervical / Trunk Assessment Cervical / Trunk Assessment: Kyphotic   Communication Communication Communication: Expressive difficulties   Cognition Arousal/Alertness: Awake/alert Behavior During Therapy: Flat affect Overall Cognitive Status: No family/caregiver present to determine baseline cognitive functioning (Pt with h/o dementia )                     General Comments       Exercises       Shoulder Instructions      Home Living Family/patient expects to be discharged to:: Skilled nursing facility  Prior Functioning/Environment Level of Independence: Needs assistance  Gait / Transfers Assistance Needed: Pt reports she was transferring to w/c at [place] / Homemaking Assistance Needed: Pt required assist with ADLs         OT Diagnosis: Generalized weakness;Cognitive deficits   OT Problem List: Decreased strength;Decreased activity tolerance;Impaired balance (sitting and/or standing);Decreased safety awareness;Decreased knowledge of use of DME or AE;Decreased cognition   OT Treatment/Interventions: Self-care/ADL training;Therapeutic exercise;DME and/or AE  instruction;Therapeutic activities;Cognitive remediation/compensation;Patient/family education;Balance training    OT Goals(Current goals can be found in the care plan section) Acute Rehab OT Goals Patient Stated Goal: go back to [facility]  OT Goal Formulation: With patient Time For Goal Achievement: 2015/06/10 Potential to Achieve Goals: Good ADL Goals Pt Will Perform Grooming: with supervision;sitting Pt Will Perform Upper Body Bathing: with supervision;sitting Pt Will Perform Lower Body Bathing: with mod assist;sit to/from stand Pt Will Transfer to Toilet: with mod assist;stand pivot transfer;bedside commode Pt Will Perform Toileting - Clothing Manipulation and hygiene: with max assist;sit to/from stand  OT Frequency: Min 2X/week   Barriers to D/C: Decreased caregiver support  Pt from SNF with plan to return to SNF        Co-evaluation              End of Session Nurse Communication: Mobility status  Activity Tolerance: Patient tolerated treatment well Patient left: in bed;with call bell/phone within reach;with bed alarm set;with nursing/sitter in room   Time: [id number] OT Time Calculation (min): 22 min Charges:  OT General Charges $OT Visit: 1 Procedure OT Evaluation $OT Eval Moderate Complexity: 1 Procedure G-Codes:    Dollye Glasser M 05/27/2015, 12:29 PM

## 2015-05-05 NOTE — Progress Notes (Signed)
Daily Progress Note   Patient Name: [name]       Date: 05/05/2015 DOB: 08/08/1919  Age: 82 y.o. MRN#: [id number] Attending Physician: Barton Dubois, MD Primary Care Physician: Jani Gravel, MD Admit Date: 05/03/2015  Reason for Consultation/Follow-up: Establishing goals of care  Subjective:  awake, not as alert as 4-9, resting in bed, no family at bedside.   Interval Events: Patient with ?TIA in afternoon on 4-9. Neuro impressions noted.  Agree with speech eval.  As per my discussions with the patient and her HCPOA daughter [name], no limits on care other than DNR DNI. To continue with ivf, antibiotics etc to try to get her better.  Has yeast in urine, pharmacy note reviewed.  Monitor mental status and overall trajectory, if stabilizes in 24-48 hours, then can consider SNF rehab with palliative, other wise, will have to start discussions regarding residential hospice with patient and/or daughter [name]. Palliative will continue to follow along.    Length of Stay: 2 days  Current Medications: Scheduled Meds:  . aspirin  150 mg Rectal Daily  . enoxaparin (LOVENOX) injection  30 mg Subcutaneous Q24H  . fluconazole (DIFLUCAN) IV  100 mg Intravenous Q24H  . heparin flush  50 Units Intravenous Q12H  . levothyroxine  75 mcg Intravenous QAC breakfast  . metoprolol  5 mg Intravenous 3 times per day  . sodium chloride  2,500 mL Intravenous Once  . sodium chloride flush  3 mL Intravenous Q12H  . tigecycline (TYGACIL) IVPB  50 mg Intravenous Q12H    Continuous Infusions: . sodium chloride 75 mL/hr at 05/05/15 0001    PRN Meds: acetaminophen **OR** acetaminophen, hydrALAZINE, ondansetron (ZOFRAN) IV, sodium chloride flush  Physical Exam: Physical Exam             Weak, elderly  lady, resting in bed S1 S2 Shallow clear anteriorly Abdomen soft No edema, s/p tm amputn Opens eyes, does verbalize some and also follow commands.   Vital Signs: BP 168/99 mmHg  Pulse 74  Temp(Src) 98.1 F (36.7 C) (Oral)  Resp 20  Wt 71.668 kg (158 lb)  SpO2 97% SpO2: SpO2: 97 % O2 Device: O2 Device: Not Delivered O2 Flow Rate:    Intake/output summary:  Intake/Output Summary (Last 24 hours) at 05/05/15 0857 Last data filed at 05/05/15 [phone]  Gross per 24 hour  Intake    240 ml  Output      0 ml  Net    240 ml   LBM: Last BM Date: 05/03/15 Baseline Weight: Weight: 29.7 kg (65 lb 7.6 oz) (See Progress Note.) Most recent weight: Weight: 71.668 kg (158 lb)       Palliative Assessment/Data: Flowsheet Rows        Most Recent Value   Intake Tab    Referral Department  Hospitalist   Unit at Time of Referral  Med/Surg Unit   Palliative Care Primary Diagnosis  Neurology   Palliative Care Type  Return patient Palliative Care   Reason for referral  Clarify Goals of Care   Date first seen by Palliative Care  05/04/15   Clinical Assessment    Palliative Performance Scale Score  30%   Pain Max last 24 hours  3   Pain Min Last 24 hours  2   Dyspnea Max Last 24 Hours  2   Dyspnea Min Last 24 hours  1   Psychosocial & Spiritual Assessment    Palliative Care Outcomes    Patient/Family meeting held?  No   Who was at the meeting?  patient, daughter over the phone.    Palliative Care follow-up planned  Yes, Facility   Other Treatment Preference Instructions  needs palliative care at SNF rehab on D/C       Additional Data Reviewed: CBC    Component Value Date/Time   WBC 9.7 05/05/2015 0422   WBC 9.5 04/29/2015   RBC 3.96 05/05/2015 0422   RBC 3.45* 06/07/2008 1040   HGB 10.6* 05/05/2015 0422   HCT 32.2* 05/05/2015 0422   PLT 356 05/05/2015 0422   MCV 81.3 05/05/2015 0422   MCH 26.8 05/05/2015 0422   MCHC 32.9 05/05/2015 0422   RDW 14.6 05/05/2015 0422   LYMPHSABS 1.8  05/03/2015 0139   MONOABS 1.1* 05/03/2015 0139   EOSABS 0.3 05/03/2015 0139   BASOSABS 0.1 05/03/2015 0139    CMP     Component Value Date/Time   NA 137 05/05/2015 0422   NA 141 04/29/2015   K 4.5 05/05/2015 0422   CL 108 05/05/2015 0422   CO2 23 05/05/2015 0422   GLUCOSE 212* 05/05/2015 0422   BUN 32* 05/05/2015 0422   BUN 45* 04/29/2015   CREATININE 1.21* 05/05/2015 0422   CREATININE 1.8* 04/29/2015   CALCIUM 8.3* 05/05/2015 0422   PROT 6.1* 05/03/2015 0553   ALBUMIN 2.8* 05/03/2015 0553   AST 12* 05/03/2015 0553   ALT 12* 05/03/2015 0553   ALKPHOS 67 05/03/2015 0553   BILITOT 0.3 05/03/2015 0553   GFRNONAA 37* 05/05/2015 0422   GFRAA 43* 05/05/2015 0422       Problem List:  Patient Active Problem List   Diagnosis Date Noted  . Encounter for palliative care   . Goals of care, counseling/discussion   . Urinary tract infection, site not specified   . Chronic atrial fibrillation ([place])   . UTI (lower urinary tract infection) 05/03/2015  . Sepsis ([place]) 05/03/2015  . Acute encephalopathy 05/03/2015  . Hypothermia 05/03/2015  . Hypoglycemia   . TIA (transient ischemic attack)   . Type 2 diabetes mellitus with circulatory disorder ([place])   . Dementia   . CVA (cerebral infarction) 04/10/2015  . S/P transmetatarsal amputation of right foot ([place]) 04/10/2015  . History of CVA w/ right side deficits 04/05/2015  . Chronic osteomyelitis ([place]) 04/05/2015  . Diabetes mellitus  with renal manifestations, uncontrolled ([place]) 10/21/2014  . Chronic ulcer of left foot ([place]) 10/21/2014  . Chronic diastolic CHF (congestive heart failure) ([place]) 10/21/2014  . Diabetic neuropathy ([place]) 08/09/2014  . Coronary atherosclerosis of native coronary artery 06/07/2012  . Chronic kidney disease (CKD), stage IV (severe) ([place]) 04/12/2012  . Hypothyroidism 04/21/2011  . HYPERTENSION, BENIGN ESSENTIAL 12/08/2006     Palliative Care Assessment & Plan    1.Code Status:  DNR    Code Status  Orders        Start     Ordered   05/04/15 1441  Do not attempt resuscitation (DNR)   Continuous    Question Answer Comment  In the event of cardiac or respiratory ARREST Do not call a "code blue"   In the event of cardiac or respiratory ARREST Do not perform Intubation, CPR, defibrillation or ACLS   In the event of cardiac or respiratory ARREST Use medication by any route, position, wound care, and other measures to relive pain and suffering. May use oxygen, suction and manual treatment of airway obstruction as needed for comfort.      05/04/15 1440    Code Status History    Date Active Date Inactive Code Status Order ID Comments User Context   05/03/2015  4:53 AM 05/04/2015  2:40 PM Full Code [id number]  Ivor Costa, MD ED   04/10/2015  6:53 PM 04/12/2015 11:07 PM Full Code [id number]  Samella Parr, NP Inpatient   04/04/2015  9:56 PM 04/08/2015  6:23 PM Full Code [id number]  Norval Morton, MD ED   10/22/2014 11:27 AM 10/25/2014  8:06 PM DNR [id number]  Thurnell Lose, MD Inpatient   10/21/2014  5:31 PM 10/22/2014 11:27 AM Full Code [id number]  Robbie Lis, MD Inpatient   08/08/2014  5:20 PM 08/13/2014  6:08 PM Full Code [id number]  Theodis Blaze, MD Inpatient   11/18/2013 12:57 PM 11/21/2013  4:33 PM Full Code [id number]  Barton Dubois, MD Inpatient   08/28/2013 12:55 AM 08/30/2013  7:16 PM Full Code [id number]  Hosie Poisson, MD Inpatient   04/14/2012  7:40 PM 04/17/2012  5:41 PM Full Code [id number]  Newt Minion, MD Inpatient   04/12/2012 12:18 AM 04/14/2012  7:40 PM Full Code [id number]  Janell Quiet, MD Inpatient   04/21/2011  9:40 PM 04/27/2011  7:19 PM Full Code [id number]  Janae Bridgeman, RN Inpatient       2. Goals of Care/Additional Recommendations:  Limitations on Scope of Treatment: DNR DNI, but no other limits on care, full scope of medical treatment such as fluids, antibiotics.   Desire for further Chaplaincy support:no  Psycho-social Needs: Caregiving  Support/Resources  3. Symptom  Management:      1. As above   4. Palliative Prophylaxis:   Delirium Protocol  5. Prognosis: Unable to determine but appears guarded given advanced age and multiple acute and underlying co morbidities.   6. Discharge Planning:  [place] for rehab with Palliative care service follow-up   Care plan was discussed with  Patient.   Thank you for allowing the Palliative Medicine Team to assist in the care of this patient.   Time In:  830 Time Out: 855 Total Time 25 Prolonged Time Billed  no        [phone] Loistine Chance, MD  05/05/2015, 8:57 AM  Please contact Palliative Medicine Team phone at [phone] for questions and concerns.

## 2015-05-05 NOTE — Progress Notes (Signed)
C diff specimen sent to the lab.  Lab would not accept specimen, saying that stool was formed.  Lab requested new sample be sent, when we get a sample that is watery.

## 2015-05-05 NOTE — NC FL2 (Signed)
[place] LEVEL OF CARE SCREENING TOOL     IDENTIFICATION  Patient Name: [name] Birthdate: 1919/07/08 Sex: female Admission Date (Current Location): 05/03/2015  [facility] [place] and [place] Number:  Herbalist and Address:  [facility],  [place] [address] [place]      Provider Number: [id number]  Attending Physician Name and Address:  Barton Dubois, MD  Relative Name and Phone Number:       Current Level of Care: Hospital Recommended Level of Care: [place] Prior Approval Number:    Date Approved/Denied:   PASRR Number: [id number]  Discharge Plan: SNF    Current Diagnoses: Patient Active Problem List   Diagnosis Date Noted  . Encounter for palliative care   . Goals of care, counseling/discussion   . Urinary tract infection, site not specified   . Chronic atrial fibrillation ([place])   . UTI (lower urinary tract infection) 05/03/2015  . Sepsis ([place]) 05/03/2015  . Acute encephalopathy 05/03/2015  . Hypothermia 05/03/2015  . Hypoglycemia   . TIA (transient ischemic attack)   . Type 2 diabetes mellitus with circulatory disorder ([place])   . Dementia   . CVA (cerebral infarction) 04/10/2015  . S/P transmetatarsal amputation of right foot ([place]) 04/10/2015  . History of CVA w/ right side deficits 04/05/2015  . Chronic osteomyelitis ([place]) 04/05/2015  . Diabetes mellitus with renal manifestations, uncontrolled ([place]) 10/21/2014  . Chronic ulcer of left foot ([place]) 10/21/2014  . Chronic diastolic CHF (congestive heart failure) ([place]) 10/21/2014  . Diabetic neuropathy ([place]) 08/09/2014  . Coronary atherosclerosis of native coronary artery 06/07/2012  . Chronic kidney disease (CKD), stage IV (severe) ([place]) 04/12/2012  . Hypothyroidism 04/21/2011  . HYPERTENSION, BENIGN ESSENTIAL 12/08/2006    Orientation RESPIRATION BLADDER Height & Weight     Self, Time, Place  Normal Incontinent Weight: 158 lb (71.668 kg) Height:      BEHAVIORAL SYMPTOMS/MOOD NEUROLOGICAL BOWEL NUTRITION STATUS  Other (Comment) (NONE)  (NONE) Incontinent Diet (Diet clear liquid; Room service appropriate; Fluid consistency: Thin )  AMBULATORY STATUS COMMUNICATION OF NEEDS Skin   Extensive Assist Verbally Surgical wounds (Incision (Closed); Location: Foot; Orientation: Right; Dressing Change: Twice a Day)                       Personal Care Assistance Level of Assistance    Bathing Assistance: Maximum assistance Feeding assistance: Independent Dressing Assistance: Maximum assistance     Functional Limitations Info  Sight, Hearing, Speech Sight Info: Adequate Hearing Info: Adequate Speech Info: Adequate    SPECIAL CARE FACTORS FREQUENCY  PT (By licensed PT), OT (By licensed OT)     PT Frequency: 5 x week OT Frequency: 5 x week            Contractures Contractures Info: Not present    Additional Factors Info  Code Status, Allergies, Insulin Sliding Scale Code Status Info: DNR Code Allergies Info: Corn-containing Products, Ephedrine, Morphine, Rocephin   Insulin Sliding Scale Info: Novolog 3 x daily       Current Medications (05/05/2015):  This is the current hospital active medication list Current Facility-Administered Medications  Medication Dose Route Frequency Provider Last Rate Last Dose  . 0.9 %  sodium chloride infusion   Intravenous Continuous Ivor Costa, MD 75 mL/hr at 05/05/15 0001    . acetaminophen (TYLENOL) tablet 650 mg  650 mg Oral Q6H PRN Ivor Costa, MD   650 mg at 05/04/15 [phone]  Or  . acetaminophen (TYLENOL) suppository 650 mg  650 mg Rectal Q6H PRN Ivor Costa, MD      . aspirin suppository 150 mg  150 mg Rectal Daily Ivor Costa, MD   150 mg at 05/05/15 0949  . enoxaparin (LOVENOX) injection 30 mg  30 mg Subcutaneous Q24H Ivor Costa, MD   30 mg at 05/05/15 0949  . fluconazole (DIFLUCAN) IVPB 100 mg  100 mg Intravenous Q24H Leann T Poindexter, RPH   100 mg at 05/04/15 2218  . heparin flush 10  UNIT/ML injection 50 Units  50 Units Intravenous Q12H Ivor Costa, MD   50 Units at 05/03/15 1000  . hydrALAZINE (APRESOLINE) injection 5 mg  5 mg Intravenous Q6H PRN Dianne Dun, NP   5 mg at 05/05/15 0001  . levothyroxine (SYNTHROID, LEVOTHROID) injection 75 mcg  75 mcg Intravenous QAC breakfast Ivor Costa, MD   75 mcg at 05/05/15 0949  . metoprolol (LOPRESSOR) injection 5 mg  5 mg Intravenous 3 times per day Barton Dubois, MD   5 mg at 05/05/15 0700  . ondansetron (ZOFRAN) injection 4 mg  4 mg Intravenous Q6H PRN Dianne Dun, NP   4 mg at 05/04/15 1255  . sodium chloride 0.9 % bolus 2,500 mL  2,500 mL Intravenous Once Ivor Costa, MD      . sodium chloride flush (NS) 0.9 % injection 10-40 mL  10-40 mL Intracatheter PRN Barton Dubois, MD   10 mL at 05/05/15 0422  . sodium chloride flush (NS) 0.9 % injection 3 mL  3 mL Intravenous Q12H Ivor Costa, MD      . tigecycline (TYGACIL) 50 mg in sodium chloride 0.9 % 100 mL IVPB  50 mg Intravenous Q12H Ivor Costa, MD   50 mg at 05/05/15 [id number]     Discharge Medications: Please see discharge summary for a list of discharge medications.  Relevant Imaging Results:  Relevant Lab Results:   Additional Information SSN: [id number]  Harlon Flor, Student-SW [phone]

## 2015-05-05 NOTE — Progress Notes (Signed)
Pharmacy Antibiotic Note  [name] is a 82 y.o. female with recent R transmetatarsal amputation due to chronic osteomyelitis admitted on 05/03/2015 with AMS, hypoglycemia, UTI, and sepsis.  Pharmacy has been consulted for Primaxin dosing for UTI as recommended per ID.  Plan: Primaxin 250mg  IV q6h. Continue to monitor renal function, cultures, clinical course.   Weight: 158 lb (71.668 kg)  Temp (24hrs), Avg:98.1 F (36.7 C), Min:97.5 F (36.4 C), Max:98.6 F (37 C)   Recent Labs Lab 04/29/15 05/03/15 0139 05/03/15 0147 05/03/15 0553 05/03/15 0914 05/05/15 0422  WBC 9.5 19.7*  --  13.1*  --  9.7  CREATININE 1.8*  --  1.40* 1.37*  --  1.21*  LATICACIDVEN  --   --   --  0.8 0.9  --     Estimated Creatinine Clearance: 29.3 mL/min (by C-G formula based on Cr of 1.21).    Allergies  Allergen Reactions  . Corn-Containing Products Other (See Comments)    Reaction:  Unknown   . Ephedrine Other (See Comments)    Reaction:  Unknown   . Morphine Other (See Comments)    Reaction:  Difficulty waking up and talking with this medication.    . Rocephin [Ceftriaxone] Other (See Comments)    Possible anaphylaxis reaction as documented previously in patient's medical history     Antimicrobials this admission: PTA Tigecycline >> 4/10  4/8 Primaxin >>4/9, restart 4/10 >> 4/9 Fluconazole >> 4/10  Microbiology results: 4/8 BCx: NGTD 4/8 UCx: > 100,000 colonies/mL yeast (final)  Thank you for allowing pharmacy to be a part of this patient's care.  Lindell Spar, PharmD, BCPS Pager: [phone] 05/05/2015 7:39 PM

## 2015-05-05 NOTE — Clinical Social Work Note (Signed)
Clinical Social Work Assessment  Patient Details  Name: [name] MRN: [id number] Date of Birth: 23-May-1919  Date of referral:  05/05/15               Reason for consult:  Facility Placement                Permission sought to share information with:    Permission granted to share information::     Name::        Agency::     Relationship::     Contact Information:     Housing/Transportation Living arrangements for the past 2 months:  [place], [place] of Information:  Patient Patient Interpreter Needed:  None Criminal Activity/Legal Involvement Pertinent to Current Situation/Hospitalization:  No - Comment as needed Significant Relationships:  Adult Children Lives with:  Facility Resident Do you feel safe going back to the place where you live?  Yes Need for family participation in patient care:  Yes (Comment)  Care giving concerns:  Pt admitted from [facility]. PT recommends continued short-term rehab at facility.    Social Worker assessment / plan:  CSW received consult for facility resident. BSW Intern met with pt and pt daughter, [name] at bedside. BSW Intern introduced self and explained role. Pt and pt daughter both confirmed plans are return to [facility] at time of dc.   BSW Intern provided supportive listening while pt daughter discussed pt being in and out of the hospital for the last month. Pt daughter interested in possible long-term care. BSW Intern to provide pt daughter with list of SNF and ALF facilities.   Pt daughter reports pt will need nonemergency transport to [facility] at [facility].   CSW confirmed with [facility] pt plans to return when medically ready for dc. CSW faxed over pt information via Epic hub to [facility].  CSW continuing to follow and assist with disposition needs when appropriate.   Employment status:  Retired Forensic scientist:  Information systems manager, Programmer, applications PT Recommendations:  [place] / Referral to community resources:  [place]  Patient/Family's Response to care:  Pt disoriented to situation. Pt daughter very involved in pt care. Pt responded to questions when asked directly but fell asleep during assessment.   Patient/Family's Understanding of and Emotional Response to Diagnosis, Current Treatment, and Prognosis:  Pt daughter understands pt condition at this time and reports no further questions.   Emotional Assessment Appearance:  Appears stated age Attitude/Demeanor/Rapport:  Other (Appropriate) Affect (typically observed):  Calm, Quiet Orientation:  Oriented to Self, Oriented to Place, Oriented to  Time Alcohol / Substance use:  Not Applicable Psych involvement (Current and /or in the community):  No (Comment)  Discharge Needs  Concerns to be addressed:  Care Coordination Readmission within the last 30 days:  No Current discharge risk:  None Barriers to Discharge:  Continued Medical Work up   [employer], Student-SW 05/05/2015, 3:14 PM

## 2015-05-05 NOTE — Progress Notes (Signed)
[facility] PROGRESS NOTE  [name] [id number] DOB: [birth year] DOA: 05/03/2015 PCP: Jani Gravel, MD  Assessment/Plan: UTI and sepsis: Patient has positive urinalysis with large amount of leukocyte, consistent with UTI. Patient is septic with leukocytosis, hypothermia and encephalopathy.  -continue gentle IVF's -follow cx data -continue primaxin and d/c Tigecycline and fluconmazole. -follow clinical response -WBC's trending down, no fever or hypothermia anymore. Mentation back to baseline according to family  Acute encephalopathy: Most likely due to UTI and sepsis. -Treating UTI as above -Follow clinical response   Hx of stroke/TIA -continue ASA -recent admission for TIA -completed work up approx 3 weeks ago -discussed with neurology and will monitor for now -patient is back to baseline -will consider using plavix and low dose ASA when able to take PO  HTN: -continue IV metoprolol with holding parameter  Hx of Afib/flutter:  -CHA2DS2-VASc Score is 6, needs oral anticoagulation, but patient is not on [facility] due to old age, dementia and high risk of fall.  -HR is well controled. -on IV metoprolol -ASA for prevention  Dysphagia -will ask SPL to assess -CLD and IV meds as much as possible for now  Hypothyroidism:  -TSH WNL -will continue synthroid -currently IV, given concerns for dysphagia  DM-II: Last A1c 11.9 on 04/11/15, poorly controled. Patient is taking Lantus at home -will use SSI -patient still not eating much and a high risk for hypoglycemia   S/P transmetatarsal amputation of right foot [facility]): due to chronic osteomyelitis -will discontinue IV Tigecycline as recommended by ID -consult wound care for dressing instructions and care  Chronic kidney disease (CKD), stage IV (severe) ([place]): Stable.  -Creatinine is 1.4 on admission. -Follow-up renal function intermittently  Code Status: DNR Family Communication: daughter over the phone  Disposition Plan:  back to SNF for further treatment and rehab when medically stable. In 1-2 days.   Consultants:  Neurology curbside (during acute event of TIA)  Palliative Care  ID (Dr. Linus Salmons;  Will d/c tigicycline and fluconazole; complete 5 days total of primaxin.)  Procedures:  See below for x-ray reports   Antibiotics:  primaxin (4/08>>>4/13)  tigecycline  Stopped on 4/10  HPI/Subjective: Afebrile, no overnight events. Denies CP, SOB and abd pain. 4 loose stools today as main complaint. No further TIA or neurologic deficits appreciated.  Objective: Filed Vitals:   05/05/15 1422 05/05/15 1606  BP:  193/65  Pulse: 62   Temp: 97.5 F (36.4 C)   Resp:      Intake/Output Summary (Last 24 hours) at 05/05/15 1708 Last data filed at 05/05/15 1253  Gross per 24 hour  Intake    480 ml  Output      0 ml  Net    480 ml   Filed Weights   05/03/15 0745 05/04/15 0300  Weight: 29.7 kg (65 lb 7.6 oz) 71.668 kg (158 lb)    Exam:   General: afebrile, and without CP or SOB. Patient is AAOX3; no complaints.  Cardiovascular: rate controlled. No rubs or gallops, no JVD  Respiratory: good air movement, no wheezing, no crackles  Abdomen: soft, NT, ND, positive BS  Musculoskeletal: RLE with clean dressing and healing wound on her right foot, from recent transmetatarsal amputation. Patient LLE w/o edema or cyanosis   Neurology: no focal deficit appreciated.  Data Reviewed: Basic Metabolic Panel:  Recent Labs Lab 04/29/15 05/03/15 0147 05/03/15 0553 05/05/15 0422  NA 141 138 136 137  K 5.5* 4.9 5.0 4.5  CL  --  104  107 108  CO2  --   --  21* 23  GLUCOSE  --  221* 174* 212*  BUN 45* 45* 51* 32*  CREATININE 1.8* 1.40* 1.37* 1.21*  CALCIUM  --   --  8.1* 8.3*   Liver Function Tests:  Recent Labs Lab 04/29/15 05/03/15 0553  AST 9* 12*  ALT 6* 12*  ALKPHOS 71 67  BILITOT  --  0.3  PROT  --  6.1*  ALBUMIN  --  2.8*   CBC:  Recent Labs Lab 04/29/15 05/03/15 0139  05/03/15 0147 05/03/15 0553 05/05/15 0422  WBC 9.5 19.7*  --  13.1* 9.7  NEUTROABS 5 16.5*  --   --   --   HGB 9.1* 10.6* 11.2* 9.5* 10.6*  HCT 29* 32.2* 33.0* 29.3* 32.2*  MCV  --  84.1  --  83.0 81.3  PLT 339 340  --  331 356   BNP (last 3 results)  Recent Labs  05/03/15 0553  BNP 47.2   CBG:  Recent Labs Lab 05/03/15 0008 05/03/15 0434 05/04/15 0017 05/04/15 1050 05/05/15 1432  GLUCAP 146* 179* 104* 190* 244*    Recent Results (from the past 240 hour(s))  Urine culture     Status: Abnormal   Collection Time: 05/03/15  2:55 AM  Result Value Ref Range Status   Specimen Description URINE, CATHETERIZED  Final   Special Requests Normal  Final   Culture >=100,000 COLONIES/mL YEAST (A)  Final   Report Status 05/04/2015 FINAL  Final  Blood culture (routine x 2)     Status: None (Preliminary result)   Collection Time: 05/03/15  4:03 AM  Result Value Ref Range Status   Specimen Description BLOOD RIGHT HAND  Final   Special Requests BOTTLES DRAWN AEROBIC AND ANAEROBIC 5CC  Final   Culture   Final    NO GROWTH 2 DAYS Performed at [facility]    Report Status PENDING  Incomplete  Blood culture (routine x 2)     Status: None (Preliminary result)   Collection Time: 05/03/15  4:09 AM  Result Value Ref Range Status   Specimen Description BLOOD LEFT HAND  Final   Special Requests BOTTLES DRAWN AEROBIC AND ANAEROBIC 5CC  Final   Culture   Final    NO GROWTH 2 DAYS Performed at [facility]    Report Status PENDING  Incomplete     Studies: Ct Head Wo Contrast  05/04/2015  CLINICAL DATA:  Sudden onset of altered mental status approximately 45 minutes ago. EXAM: CT HEAD WITHOUT CONTRAST TECHNIQUE: Contiguous axial images were obtained from the base of the skull through the vertex without intravenous contrast. COMPARISON:  05/03/2015 FINDINGS: The ventricles are normal in configuration. There is ventricular and sulcal enlargement reflecting moderate atrophy. No  hydrocephalus. There are no parenchymal masses or mass effect. There is no evidence of a recent cortical infarct. Patchy white matter hypoattenuation is noted, stable from the prior exam, consistent with mild chronic microvascular ischemic change. There are no extra-axial masses or abnormal fluid collections. There is no intracranial hemorrhage. The visualized sinuses are clear. IMPRESSION: 1. No acute intracranial abnormalities. No evidence of a recent cortical infarct or of intracranial hemorrhage. 2. Atrophy and mild chronic microvascular ischemic change. These results were called by telephone at the time of interpretation on 05/04/2015 at 11:24 am to nurse Alease Medina, who verbally acknowledged these results. Electronically Signed   By: Lajean Manes M.D.   On: 05/04/2015 11:25  Scheduled Meds: . aspirin  150 mg Rectal Daily  . enoxaparin (LOVENOX) injection  30 mg Subcutaneous Q24H  . heparin flush  50 Units Intravenous Q12H  . insulin aspart  0-9 Units Subcutaneous TID WC  . levothyroxine  75 mcg Intravenous QAC breakfast  . metoprolol  5 mg Intravenous 3 times per day  . saccharomyces boulardii  250 mg Oral BID  . sodium chloride  2,500 mL Intravenous Once  . sodium chloride flush  3 mL Intravenous Q12H   Continuous Infusions: . sodium chloride 75 mL/hr at 05/05/15 0001    Principal Problem:   UTI (lower urinary tract infection) Active Problems:   HYPERTENSION, BENIGN ESSENTIAL   Hypothyroidism   Chronic kidney disease (CKD), stage IV (severe) (HCC)   Coronary atherosclerosis of native coronary artery   Diabetic neuropathy (HCC)   Diabetes mellitus with renal manifestations, uncontrolled (HCC)   Chronic diastolic CHF (congestive heart failure) ([place])   History of CVA w/ right side deficits   S/P transmetatarsal amputation of right foot ([place])   Dementia   Sepsis ([place])   Acute encephalopathy   Hypothermia   Encounter for palliative care   Goals of care, counseling/discussion    Urinary tract infection, site not specified   Chronic atrial fibrillation ([place])    Time spent: 45 minutes (> 50% of the time dedicated to face to face evaluation, coordination care and discussion/update to her daughter about condition)    Barton Dubois  [facility] Pager [phone]. If 7PM-7AM, please contact night-coverage at [email or web address], password [facility] 05/05/2015, 5:08 PM  LOS: 2 days

## 2015-05-06 DIAGNOSIS — F05 Delirium due to known physiological condition: Secondary | ICD-10-CM

## 2015-05-06 DIAGNOSIS — R410 Disorientation, unspecified: Secondary | ICD-10-CM | POA: Insufficient documentation

## 2015-05-06 LAB — GLUCOSE, CAPILLARY
GLUCOSE-CAPILLARY: 258 mg/dL — AB (ref 65–99)
GLUCOSE-CAPILLARY: 207 mg/dL — AB (ref 65–99)
GLUCOSE-CAPILLARY: 198 mg/dL — AB (ref 65–99)
Glucose-Capillary: 211 mg/dL — ABNORMAL HIGH (ref 65–99)

## 2015-05-06 LAB — C DIFFICILE QUICK SCREEN W PCR REFLEX
C DIFFICILE (CDIFF) INTERP: NEGATIVE
C DIFFICILE (CDIFF) TOXIN: NEGATIVE
C Diff antigen: NEGATIVE

## 2015-05-06 MED ORDER — QUETIAPINE 12.5 MG HALF TABLET
12.5000 mg | ORAL_TABLET | Freq: Every day | ORAL | Status: DC
  Filled 2015-05-06 (×2): qty 1

## 2015-05-06 NOTE — Progress Notes (Signed)
Daily Progress Note   Patient Name: [name]       Date: 05/06/2015 DOB: 12-22-1919  Age: 82 y.o. MRN#: [id number] Attending Physician: Barton Dubois, MD Primary Care Physician: Jani Gravel, MD Admit Date: 05/03/2015  Reason for Consultation/Follow-up: Establishing goals of care  Subjective: I met today with [name] and [name] daughter, [name].  [name] daughter reports that she has seen a significant change in [name] when visiting with [name] today. She thinks [name] cognitive status is much worse today. Overall, [name] denies any complaints and continually requests to go home (meaning [employer]). Is difficult to obtain history from [name] today based on [name] mental status.  We talked about [name]'s baseline as well as [name] chronic medical conditions at length today. I'm concerned that may she may be developing hypoactive delirium. Discuss with Dr. Dyann Kief and he is in agreement with trial of Seroquel 12.5 mg nightly to see if this improves [name] cognitive status.  I discussed with [name] daughter my concerns regarding [name]'s overall nutrition, functional status, and cognition the changes we've been seeing regarding this. Plan to continue to monitor mental status and overall trajectory, if stabilizes in 24-48 hours, then can consider SNF rehab with palliative.  We discussed that this is a delirium picture, being in the hospital would be a large risk factor for this and she may improve more quickly after she is transitioned back to [facility] where she has been staying the past several weeks.  Length of Stay: 3 days  Current Medications: Scheduled Meds:  . aspirin  150 mg Rectal Daily  . enoxaparin (LOVENOX) injection  30 mg Subcutaneous Q24H  . heparin flush  50 Units Intravenous Q12H  .  imipenem-cilastatin  250 mg Intravenous Q6H  . insulin aspart  0-9 Units Subcutaneous TID WC  . levothyroxine  75 mcg Intravenous QAC breakfast  . metoprolol  5 mg Intravenous 3 times per day  . QUEtiapine  12.5 mg Oral QHS  . saccharomyces boulardii  250 mg Oral BID  . sodium chloride  2,500 mL Intravenous Once  . sodium chloride flush  3 mL Intravenous Q12H    Continuous Infusions: . sodium chloride 20 mL/hr at 05/05/15 1823    PRN Meds: acetaminophen **OR** acetaminophen, hydrALAZINE, ondansetron (ZOFRAN) IV, sodium chloride flush  Physical Exam: Physical Exam             Weak, elderly lady, resting in bed S1 S2 Shallow clear anteriorly Abdomen soft No edema, s/p tm amputn Opens eyes, does verbalize some and also follow commands.   Vital Signs: BP 177/59 mmHg  Pulse 72  Temp(Src) 97.9 F (36.6 C) (Axillary)  Resp 18  Wt 71.668 kg (158 lb)  SpO2 97% SpO2: SpO2: 97 % O2 Device: O2 Device: Not Delivered O2 Flow Rate:    Intake/output summary:   Intake/Output Summary (Last 24 hours) at 05/06/15 1851 Last data filed at 05/05/15 1900  Gross per 24 hour  Intake     60 ml  Output      0 ml  Net     60 ml   LBM: Last BM Date: 05/05/15 Baseline Weight: Weight: 29.7 kg (65 lb 7.6 oz) (See Progress Note.) Most recent weight: Weight: 71.668 kg (158 lb)       Palliative Assessment/Data: Flowsheet Rows        Most Recent Value   Intake Tab    Referral Department  Hospitalist   Unit at Time of Referral  Med/Surg Unit   Palliative Care Primary Diagnosis  Neurology   Date Notified  05/03/15   Palliative Care Type  Return patient Palliative Care   Reason for referral  Clarify Goals of Care   Date of Admission  05/03/15   Date first seen by Palliative Care  05/04/15   # of days Palliative referral response time  1 Day(s)   # of days IP prior to Palliative referral  0   Clinical Assessment    Palliative Performance Scale Score  30%   Pain Max last 24 hours  3   Pain  Min Last 24 hours  2   Dyspnea Max Last 24 Hours  2   Dyspnea Min Last 24 hours  1   Psychosocial & Spiritual Assessment    Palliative Care Outcomes    Patient/Family meeting held?  No   Who was at the meeting?  patient, daughter over the phone.    Palliative Care follow-up planned  Yes, Facility   Other Treatment Preference Instructions  needs palliative care at SNF rehab on D/C       Additional Data Reviewed: CBC    Component Value Date/Time   WBC 9.7 05/05/2015 0422   WBC 9.5 04/29/2015   RBC 3.96 05/05/2015 0422   RBC 3.45* 06/07/2008 1040   HGB 10.6* 05/05/2015 0422   HCT 32.2* 05/05/2015 0422   PLT 356 05/05/2015 0422   MCV 81.3 05/05/2015 0422   MCH 26.8 05/05/2015 0422   MCHC 32.9 05/05/2015 0422   RDW 14.6 05/05/2015 0422   LYMPHSABS 1.8 05/03/2015 0139   MONOABS 1.1* 05/03/2015 0139   EOSABS 0.3 05/03/2015 0139   BASOSABS 0.1 05/03/2015 0139    CMP     Component Value Date/Time   NA 137 05/05/2015 0422   NA 141 04/29/2015   K 4.5 05/05/2015 0422   CL 108 05/05/2015 0422   CO2 23 05/05/2015 0422   GLUCOSE 212* 05/05/2015 0422   BUN 32* 05/05/2015 0422   BUN 45* 04/29/2015   CREATININE 1.21* 05/05/2015 0422   CREATININE 1.8* 04/29/2015   CALCIUM 8.3* 05/05/2015 0422   PROT 6.1* 05/03/2015 0553   ALBUMIN 2.8* 05/03/2015 0553   AST 12* 05/03/2015 0553   ALT 12* 05/03/2015 0553   ALKPHOS 67 05/03/2015 0553   BILITOT 0.3  05/03/2015 0553   GFRNONAA 37* 05/05/2015 0422   GFRAA 43* 05/05/2015 0422       Problem List:  Patient Active Problem List   Diagnosis Date Noted  . Diarrhea   . Encounter for palliative care   . Goals of care, counseling/discussion   . Urinary tract infection, site not specified   . Chronic atrial fibrillation ([place])   . UTI (lower urinary tract infection) 05/03/2015  . Sepsis ([place]) 05/03/2015  . Acute encephalopathy 05/03/2015  . Hypothermia 05/03/2015  . Hypoglycemia   . TIA (transient ischemic attack)   . Type 2  diabetes mellitus with circulatory disorder ([place])   . Dementia   . CVA (cerebral infarction) 04/10/2015  . S/P transmetatarsal amputation of right foot ([place]) 04/10/2015  . History of CVA w/ right side deficits 04/05/2015  . Chronic osteomyelitis ([place]) 04/05/2015  . Diabetes mellitus with renal manifestations, uncontrolled ([place]) 10/21/2014  . Chronic ulcer of left foot ([place]) 10/21/2014  . Chronic diastolic CHF (congestive heart failure) ([place]) 10/21/2014  . Diabetic neuropathy ([place]) 08/09/2014  . Coronary atherosclerosis of native coronary artery 06/07/2012  . Chronic kidney disease (CKD), stage IV (severe) ([place]) 04/12/2012  . Hypothyroidism 04/21/2011  . HYPERTENSION, BENIGN ESSENTIAL 12/08/2006     Palliative Care Assessment & Plan    1.Code Status:  DNR    Code Status Orders        Start     Ordered   05/04/15 1441  Do not attempt resuscitation (DNR)   Continuous    Question Answer Comment  In the event of cardiac or respiratory ARREST Do not call a "code blue"   In the event of cardiac or respiratory ARREST Do not perform Intubation, CPR, defibrillation or ACLS   In the event of cardiac or respiratory ARREST Use medication by any route, position, wound care, and other measures to relive pain and suffering. May use oxygen, suction and manual treatment of airway obstruction as needed for comfort.      05/04/15 1440    Code Status History    Date Active Date Inactive Code Status Order ID Comments User Context   05/03/2015  4:53 AM 05/04/2015  2:40 PM Full Code [id number]  Ivor Costa, MD ED   04/10/2015  6:53 PM 04/12/2015 11:07 PM Full Code [id number]  Samella Parr, NP Inpatient   04/04/2015  9:56 PM 04/08/2015  6:23 PM Full Code [id number]  Norval Morton, MD ED   10/22/2014 11:27 AM 10/25/2014  8:06 PM DNR [id number]  Thurnell Lose, MD Inpatient   10/21/2014  5:31 PM 10/22/2014 11:27 AM Full Code [id number]  Robbie Lis, MD Inpatient   08/08/2014  5:20 PM 08/13/2014  6:08 PM Full  Code [id number]  Theodis Blaze, MD Inpatient   11/18/2013 12:57 PM 11/21/2013  4:33 PM Full Code [id number]  Barton Dubois, MD Inpatient   08/28/2013 12:55 AM 08/30/2013  7:16 PM Full Code [id number]  Hosie Poisson, MD Inpatient   04/14/2012  7:40 PM 04/17/2012  5:41 PM Full Code [id number]  Newt Minion, MD Inpatient   04/12/2012 12:18 AM 04/14/2012  7:40 PM Full Code [id number]  Janell Quiet, MD Inpatient   04/21/2011  9:40 PM 04/27/2011  7:19 PM Full Code [id number]  Janae Bridgeman, RN Inpatient       2. Goals of Care/Additional Recommendations:  Limitations on Scope of Treatment: DNR DNI, but no other limits on care, full scope of medical treatment such as fluids, antibiotics.  I'm concerned she has developed hypoactive delirium. Continue delirium protocol and will plan for addition of Seroquel 12.5 mg daily at bedtime. If this is the case, she made improve after she is transitioned from hospital back to [name] skilled facility. She reports several times during encounter today that she wants to leave the hospital.  Desire for further Chaplaincy support:no  Psycho-social Needs: Caregiving  Support/Resources  3. Symptom Management:      1. As above   4. Palliative Prophylaxis:   Delirium Protocol  Standard delirium management (adapted from NICE guidelines 2011 for prevention of delirium):  Provide continuity of care when possible (avoid frequent changing of surroundings and staff).  Frequent reorientation to time with:  A clock should always be visible.  Make sure Calendar/white board is updated.  Lights on/blinds open during the day and off/closed at night.  Encourage frequent family visits.  Monitor for and treat dehydration/constipation.  Optimize oxygen saturation.  Avoid catheters and IV's when possible and look for/treat infections.  Encourage early mobility.  Assess and treat pain.  Ensure adequate nutrition and functioning dentures.  Address reversible causes of hearing and visual  impairment:  Use pocket talker if hearing aids are unavailable.  Avoid sleep disturbance (normalize sleep/wake cycle).  Minimize disturbances and consider NOT obtaining vitals at night if possible.  Review Medications to avoid polypharmacy and avoid deliriogenic medications when possible:  Benzodiazepines.  Dihydropyridines.  Antihistamines.  Anticholinergics.  (Possibly avoid: H2 blockers, tricyclic antidepressants, antiparkinson medications, steroids, NSAID's).   5. Prognosis: Unable to determine but appears guarded given advanced age and multiple acute and underlying co morbidities including compromised nutrition.   6. Discharge Planning:  [place] for rehab with Palliative care service follow-up   Care plan was discussed with  Patient, [name] daughter, and Dr. Dyann Kief   Thank you for allowing the Palliative Medicine Team to assist in the care of this patient.   Time In:  1100 Time Out: 1150 Total Time 50 Prolonged Time Billed  no        [phone] Micheline Rough, MD  05/06/2015, 6:51 PM  Please contact Palliative Medicine Team phone at [phone] for questions and concerns.

## 2015-05-06 NOTE — Progress Notes (Addendum)
BSE completed, full report to follow.  Pt with dysphagia due to poor attention, weakness c/b frequent gagging and multiple swallows.  [name] grossly weak and severely dysarthric with weak voice - indicating poor airway protection if she does aspirate.   Decreased gagging noted with pt helping to self-feed ice cream and water.  Carbonated beverage cause pt to wince and gag excessively.    No indication of aspiration noted however pt will have difficulty meeting nutritional needs and is at increased aspiration risk due to her level of dysphagia.  Also note h/o presbyesophagus that may be contributing to symptoms.    Pt clearly expresses desire to consume po - recommend consider full liquids with precautions.  Pt would benefit from consideration for medications via alternative route due to her gagging, sensitivity.  Given pt has been having difficulty swallowing for several days - ? If will improve to functional level during acute stay.    Luanna Salk, [facility] SLP [phone]

## 2015-05-06 NOTE — Care Management Important Message (Signed)
Important Message  Patient Details IM Letter given to Rhonda/Case Manager to present to Patient Name: [name] MRN: [id number] Date of Birth: [birth year]   Medicare Important Message Given:  Yes    Camillo Flaming 05/06/2015, 10:38 AMImportant Message  Patient Details  Name: [name] MRN: [id number] Date of Birth: Jan 24, 1920   Medicare Important Message Given:  Yes    Camillo Flaming 05/06/2015, 10:38 AM

## 2015-05-06 NOTE — Progress Notes (Signed)
Initial Nutrition Assessment  DOCUMENTATION CODES:   Not applicable  INTERVENTION:  - Will monitor for POC/GOC  - Continue to monitor for SLP recommendations - If pt unable to to receive PO diet and if nutrition support within POC/GOC, recommend NGT with Glucerna 1.2 @ 45 mL/hr to provide 1296 kcal,  65 grams of protein, and 869 mL free water.  NUTRITION DIAGNOSIS:   Swallowing difficulty related to dysphagia, lethargy/confusion, acute illness as evidenced by other (see comment) (SLP report.).  GOAL:   Patient will meet greater than or equal to 90% of their needs  MONITOR:   PO intake, Weight trends, Labs, Skin, I & O's  REASON FOR ASSESSMENT:   Low Braden  ASSESSMENT:   82 y.o. female with PMH of hypothyroidism, diastolic congestive heart failure, atrial flutter not on anticoagulants due to old age, dementia and high risk of fall, stroke, chronic kidney disease-stage IV, BPH, recent right transmetatarsal amputation due to chronic osteomyelitis, who presents with altered mental status and hypoglycemia.  Pt seen for low Braden. BMI indicates normal weight. Per chart review, pt consumed 75% of breakfast on Heart Healthy diet 05/03/15. Pt with hx of dementia currently exacerbated by acute encephalopathy and is unable to provide any information at time of RD visit; no family/visitors present. Spoke with SLP in the room who states that she provided pt with a few bites of ice cream and a few sips of water and that pt likes ice cream. She states that pt coughing with all intakes and that nursing staff has reported pt frequently "bringing a lot of stuff up." SLP has provided pt with emesis basin. SLP reports pt is well known to her and that current presentation is a big decline for this pt. Palliative team following pt and last note was 4/10 @ 0857.   Per RN note 4/9 @ 1240: Notified physician that patient gagging and vomiting after eating three bites of food. I reported I will give Zofran  for nausea and vomiting. Physician orders Speech Therapy evaluation and Clear Liquid diet for patient as well as continue to control nausea.  Physical assessment shows no signs of muscle or fat wasting. Per chart review, weight fluctuations of 158-165 lbs over the past 1 month; will continue to monitor weight trends during admission. Pt does not currently meet criteria for malnutrition but will update accordingly should this change.   Not meeting needs. TF recommendations outlined above should pt be unsafe for PO diet and if TF is within POC/GOC. Medications reviewed. Labs reviewed; CBGs: 104-297 mg/dL,  BUN/creatinine elevated and trending up, Ca: 8.3 mg/dL, GFR: 37.  Diet Order:  Diet clear liquid Room service appropriate?: Yes; Fluid consistency:: Thin  Skin:  Wound (see comment) (R foot wound)  Last BM:  4/10  Height:   Ht Readings from Last 1 Encounters:  04/30/15 5\' 8"  (1.727 m)    Weight:   Wt Readings from Last 1 Encounters:  05/04/15 158 lb (71.668 kg)    Ideal Body Weight:  63.64 kg (kg)  BMI:  Body mass index is 24.03 kg/(m^2).  Estimated Nutritional Needs:   Kcal:  1300-1500  Protein:  55-65 grams  Fluid:  >/= 1.5 L/day  EDUCATION NEEDS:   No education needs identified at this time    Jarome Matin, RD, LDN Inpatient Clinical Dietitian Pager # [phone] After hours/weekend pager # [phone]

## 2015-05-06 NOTE — Evaluation (Addendum)
Clinical/Bedside Swallow Evaluation Patient Details  Name: [name] MRN: [id number] Date of Birth: 23-May-1919  Today's Date: 05/06/2015 Time: SLP Start Time (ACUTE ONLY): [id number] SLP Stop Time (ACUTE ONLY): 1125 SLP Time Calculation (min) (ACUTE ONLY): 38 min  Past Medical History:  Past Medical History  Diagnosis Date  . Myocardial infarction ([place])   . Coronary artery disease   . Hypertension   . Dysrhythmia   . Peripheral vascular disease ([place])   . Hypothyroidism   . Type 2 diabetes mellitus with diabetic polyneuropathy, with long-term current use of insulin ([place])   . Presbyesophagus 04/23/2011  . Urinary retention with incomplete bladder emptying 04/23/2011  . UTI (lower urinary tract infection) 04/21/2011  . Diastolic CHF, chronic ([facility])     Grade I diastolic dysfunction on Echo 04/26/11  . Anaphylaxis 04/25/2011    Thought to be from Rocephin  . History of atrial flutter     ablation  . C. difficile colitis 04/17/2012  . Atrial flutter ([place]) 11/01/2012  . HCAP (healthcare-associated pneumonia) 10/21/2014  . Stroke [facility]) 2003    "lost the use of all of my right side"  . HOH (hard of hearing)   . CKD (chronic kidney disease), stage IV ([place])   . Expressive aphasia   . Hyperkalemia   . Hyponatremia   . Physical deconditioning   . Cellulitis of right foot   . Chronic osteomyelitis ([place])   . Chronic diastolic CHF (congestive heart failure) ([place])   . Neuropathy ([place])   . Acute blood loss anemia   . Cellulitis of right foot    Past Surgical History:  Past Surgical History  Procedure Laterality Date  . Cholecystectomy    . Colonoscopy  01/15/2011    Procedure: COLONOSCOPY;  Surgeon: Beryle Beams, MD;  Location: [facility];  Service: Endoscopy;  Laterality: N/A;  . Amputation Right 04/14/2012    Procedure: AMPUTATION RAY;  Surgeon: Newt Minion, MD;  Location: [facility];  Service: Orthopedics;  Laterality: Right;  ankle block  . Cardiac electrophysiology mapping and ablation     . Nm myocar perf wall motion  01/23/2009    mild ischemia mid inferior & apical inferior.  EF 75%  . Amputation Right 08/10/2014    Procedure: AMPUTATION RAY;  Surgeon: Mcarthur Rossetti, MD;  Location: [facility];  Service: Orthopedics;  Laterality: Right;  ankle block  . Appendectomy    . Shoulder surgery Right     "put back in place'  . Eye surgery Bilateral     Cararact  . I&d extremity Right 12/03/2014    Procedure: IRRIGATION AND DEBRIDEMENT RIGHT FOOT;  Surgeon: Mcarthur Rossetti, MD;  Location: [facility];  Service: Orthopedics;  Laterality: Right;  . Amputation Right 04/06/2015    Procedure: Transmetatarsal AMPUTATION RIGHT FOOT;  Surgeon: Mcarthur Rossetti, MD;  Location: [facility];  Service: Orthopedics;  Laterality: Right;   HPI:  [name] 82 y.o. female with h/o CVA with recurrent admits within the last month for AMS - weakness, left side weakness, cellulitis (3/14) requiring foot amputation.  She was admitted 05/03/15 with mental status change, diagnosed with UTI.  PMH + for diabetes on insulin, dysphagia, atrial flutter, CKD stage IV, hypothyroidism, HTN and anemia of chronic disease.   She has demonstrated difficulties swallowing with frequent gagging during hospital stay.     Assessment / Plan / Recommendation Clinical Impression  Pt with dysphagia due to poor attention, weakness with suspected esophageal component c/b frequent gagging  and multiple swallows.   Minimal intake observed - few boluses of water, ginger ale and ice cream only - totaling approximately 6 ounces.    [name] grossly weak with severe dysarthria and language deficits.  She demonstrated gagging with approximately 30% of boluses,  decreased gagging noted with pt holding her own cup and spoon to self-feed ice cream and water. Carbonated beverage caused pt to wince and gag excessively.    No indication of aspiration noted however pt will have difficulty meeting nutritional needs and is at increased aspiration  risk due to her level of dysphagia and known presbyesophagus.    Pt clearly expresses desire to consume po - recommend consider full liquids with precautions- as she states she likes icecream. Skilled intervention included determining mitigation strategies to help with airway protection/pt comfort during feeding.   Pt would benefit from consideration for medications via alternative route due to her gagging, sensitivity.  Given pt has been having difficulty swallowing for several days - ? If will improve to functional level during acute stay. GI referral may be beneficial given extensive esophageal hx.     Aspiration Risk  Moderate aspiration risk    Diet Recommendation   full liquids- small amounts for comfort Allow time for pt to conduct multiple swallows Have pt assist to self feed as able Sit fully puright   Medication Administration:  (via alternative means)    Other  Recommendations Recommended Consults: Consider GI evaluation Oral Care Recommendations: Oral care BID Other Recommendations: Have oral suction available;Clarify dietary restrictions   Follow up Recommendations  24 hour supervision/assistance    Frequency and Duration   1 x week         Prognosis Prognosis for Safe Diet Advancement: Guarded Barriers to Reach Goals: Cognitive deficits;Behavior      Swallow Study   General Date of Onset: 05/06/15 HPI: [name] 82 y.o. female with h/o CVA with recurrent admits within the last month for AMS - weakness, left side weakness, cellulitis (3/14) requiring foot amputation.  She was admitted 05/03/15 with mental status change, diagnosed with UTI.  PMH + for diabetes on insulin, dysphagia, atrial flutter, CKD stage IV, hypothyroidism, HTN and anemia of chronic disease.   She has demonstrated difficulties swallowing with frequent gagging during hospital stay.   Type of Study: Bedside Swallow Evaluation Diet Prior to this Study: Thin liquids Temperature Spikes Noted:  No Respiratory Status: Nasal cannula Behavior/Cognition: Alert;Confused;Distractible;Requires cueing;Doesn't follow directions Oral Care Completed by SLP: Yes Oral Cavity - Dentition:  Self-Feeding Abilities: Needs assist Patient Positioning: Upright in bed Baseline Vocal Quality: Normal Volitional Cough: Unable to elicit Volitional Swallow: unable to elicit    Oral/Motor/Sensory Function Overall Oral Motor/Sensory Function: Within functional limits   Ice Chips Ice chips: Not tested   Thin Liquid Thin Liquid: Impaired Presentation: Cup;Self Fed Oral Phase Impairments: Reduced labial seal;Reduced lingual movement/coordination Oral Phase Functional Implications: Prolonged oral transit;Other (comment) (gagging) Pharyngeal  Phase Impairments: Multiple swallows    Nectar Thick Nectar Thick Liquid: Not tested   Honey Thick Honey Thick Liquid: Not tested   Puree Puree: Impaired Presentation:  (icecream, self fed) Oral Phase Impairments: Reduced lingual movement/coordination (gagging) Oral Phase Functional Implications: Prolonged oral transit (gagging) Pharyngeal Phase Impairments: Multiple swallows   Solid   GO   Solid: Not tested        Luanna Salk, [facility] SLP [phone]

## 2015-05-06 NOTE — Progress Notes (Signed)
[facility] PROGRESS NOTE  [name] [id number] DOB: 1919/04/12 DOA: 05/03/2015 PCP: Jani Gravel, MD  Assessment/Plan: UTI and sepsis: Patient has positive urinalysis with large amount of leukocyte, consistent with UTI. Patient is septic with leukocytosis, hypothermia and encephalopathy.  -continue supportive care -continue primaxin and d/c Tigecycline and fluconazole as recommended by ID. -follow clinical response -WBC's WNL now, no fever or hypothermia anymore.  -main problem and barrier for discharge is delirium and inability for meaningful PO intake/medications  Acute encephalopathy: Most likely due to UTI and sepsis. -Treating UTI as above -Follow clinical response   Hx of stroke/TIA -continue ASA -recent admission for TIA -completed work up approx 3 weeks ago -discussed with neurology and will monitor for now -patient is back to baseline -will consider using plavix and low dose ASA when able to take PO  HTN: -continue IV metoprolol with holding parameter  Hx of Afib/flutter:  -CHA2DS2-VASc Score is 6, needs oral anticoagulation, but patient is not on [facility] due to old age, dementia and high risk of fall.  -HR is well controled. -on IV metoprolol -ASA for prevention  Dysphagia -SPL recommending full liquid diet -very high risk for aspiration  Hypothyroidism:  -TSH WNL -will continue synthroid -currently IV, given concerns for dysphagia  DM-II: Last A1c 11.9 on 04/11/15, poorly controled. Patient is taking Lantus at home -will use SSI -patient still not eating much and a high risk for hypoglycemia   S/P transmetatarsal amputation of right foot [facility]): due to chronic osteomyelitis -will discontinue IV Tigecycline as recommended by ID -consult wound care for dressing instructions and care  Chronic kidney disease (CKD), stage IV (severe) ([place]): Stable.  -Creatinine is 1.4 on admission. -Follow-up renal function intermittently  hospital acquired  delirium -will use low dose seroquel   Code Status: DNR Family Communication: daughter over the phone  Disposition Plan: back to SNF for further treatment and rehab when medically stable. Hopefully in 1-2 days. Appreciate palliative care assistance.   Consultants:  Neurology curbside (during acute event of TIA)  Palliative Care  ID (Dr. Linus Salmons;  Will d/c tigicycline and fluconazole; complete 5 days total of primaxin.)  Procedures:  See below for x-ray reports   Antibiotics:  primaxin (4/08>>>4/13)  tigecycline  Stopped on 4/10  HPI/Subjective: Afebrile, Denies CP, SOB and abd pain. Decrease in loose stools today. Having hard time eating and tolerating PO meds. Patient with acute delirium.  Objective: Filed Vitals:   05/05/15 2116 05/06/15 0520  BP: 147/70 177/59  Pulse: 66 72  Temp: 97.7 F (36.5 C) 97.9 F (36.6 C)  Resp: 19 18   No intake or output data in the 24 hours ending 05/06/15 1917 Filed Weights   05/03/15 0745 05/04/15 0300  Weight: 29.7 kg (65 lb 7.6 oz) 71.668 kg (158 lb)    Exam:   General: afebrile and denying CP/SOB. Patient with intermittent episodes of confusion and delirium. Having hard time taking medications by mouth and eating. When oriented, she engage in conversation properly and appears to be oriented X 3; but not all the time.  Cardiovascular: rate controlled. No rubs or gallops, no JVD  Respiratory: good air movement, no wheezing, no crackles  Abdomen: soft, NT, ND, positive BS  Musculoskeletal: RLE with clean dressing and healing wound on her right foot, from recent transmetatarsal amputation. Patient LLE w/o edema or cyanosis   Neurology: no focal deficit appreciated.  Data Reviewed: Basic Metabolic Panel:  Recent Labs Lab 05/03/15 0147 05/03/15 [id number] 05/05/15 0422  NA 138 136 137  K 4.9 5.0 4.5  CL 104 107 108  CO2  --  21* 23  GLUCOSE 221* 174* 212*  BUN 45* 51* 32*  CREATININE 1.40* 1.37* 1.21*  CALCIUM  --   8.1* 8.3*   Liver Function Tests:  Recent Labs Lab 05/03/15 0553  AST 12*  ALT 12*  ALKPHOS 67  BILITOT 0.3  PROT 6.1*  ALBUMIN 2.8*   CBC:  Recent Labs Lab 05/03/15 0139 05/03/15 0147 05/03/15 0553 05/05/15 0422  WBC 19.7*  --  13.1* 9.7  NEUTROABS 16.5*  --   --   --   HGB 10.6* 11.2* 9.5* 10.6*  HCT 32.2* 33.0* 29.3* 32.2*  MCV 84.1  --  83.0 81.3  PLT 340  --  331 356   BNP (last 3 results)  Recent Labs  05/03/15 0553  BNP 47.2   CBG:  Recent Labs Lab 05/05/15 1809 05/05/15 2114 05/06/15 0737 05/06/15 1159 05/06/15 1832  GLUCAP 297* 204* 258* 211* 207*    Recent Results (from the past 240 hour(s))  Urine culture     Status: Abnormal   Collection Time: 05/03/15  2:55 AM  Result Value Ref Range Status   Specimen Description URINE, CATHETERIZED  Final   Special Requests Normal  Final   Culture >=100,000 COLONIES/mL YEAST (A)  Final   Report Status 05/04/2015 FINAL  Final  Blood culture (routine x 2)     Status: None (Preliminary result)   Collection Time: 05/03/15  4:03 AM  Result Value Ref Range Status   Specimen Description BLOOD RIGHT HAND  Final   Special Requests BOTTLES DRAWN AEROBIC AND ANAEROBIC 5CC  Final   Culture   Final    NO GROWTH 3 DAYS Performed at [facility]    Report Status PENDING  Incomplete  Blood culture (routine x 2)     Status: None (Preliminary result)   Collection Time: 05/03/15  4:09 AM  Result Value Ref Range Status   Specimen Description BLOOD LEFT HAND  Final   Special Requests BOTTLES DRAWN AEROBIC AND ANAEROBIC 5CC  Final   Culture   Final    NO GROWTH 3 DAYS Performed at [facility]    Report Status PENDING  Incomplete  C difficile quick scan w PCR reflex     Status: None   Collection Time: 05/06/15  2:51 AM  Result Value Ref Range Status   C Diff antigen NEGATIVE NEGATIVE Final   C Diff toxin NEGATIVE NEGATIVE Final   C Diff interpretation Negative for toxigenic C. difficile  Final      Studies: No results found.  Scheduled Meds: . aspirin  150 mg Rectal Daily  . enoxaparin (LOVENOX) injection  30 mg Subcutaneous Q24H  . heparin flush  50 Units Intravenous Q12H  . imipenem-cilastatin  250 mg Intravenous Q6H  . insulin aspart  0-9 Units Subcutaneous TID WC  . levothyroxine  75 mcg Intravenous QAC breakfast  . metoprolol  5 mg Intravenous 3 times per day  . QUEtiapine  12.5 mg Oral QHS  . saccharomyces boulardii  250 mg Oral BID  . sodium chloride  2,500 mL Intravenous Once  . sodium chloride flush  3 mL Intravenous Q12H   Continuous Infusions: . sodium chloride 20 mL/hr at 05/05/15 1823    Principal Problem:   UTI (lower urinary tract infection) Active Problems:   HYPERTENSION, BENIGN ESSENTIAL   Hypothyroidism   Chronic kidney disease (CKD), stage IV (  severe) ([place])   Coronary atherosclerosis of native coronary artery   Diabetic neuropathy ([place])   Diabetes mellitus with renal manifestations, uncontrolled (H[facility])   Chronic diastolic CHF (congestive heart failure) ([employer])   History of CVA w/ right side deficits   S/P transmetatarsal amputation of right foot ([place])   Dementia   Sepsis ([place])   Acute encephalopathy   Hypothermia   Encounter for palliative care   Goals of care, counseling/discussion   Urinary tract infection, site not specified   Chronic atrial fibrillation ([place])   Diarrhea    Time spent: 45 minutes (> 50% of the time dedicated to face to face evaluation, coordination care and discussion/update to her daughter about condition)    Barton Dubois  [facility] Pager [phone]. If 7PM-7AM, please contact night-coverage at [email or web address], password [facility] 05/06/2015, 7:17 PM  LOS: 3 days

## 2015-05-06 NOTE — Progress Notes (Signed)
Inpatient Diabetes Program Recommendations  AACE/ADA: New Consensus Statement on Inpatient Glycemic Control (2015)  Target Ranges:  Prepandial:   less than 140 mg/dL      Peak postprandial:   less than 180 mg/dL (1-2 hours)      Critically ill patients:  140 - 180 mg/dL   Results for CATHIE, LANSANG (MRN [id number]) as of 05/06/2015 09:40  Ref. Range 05/04/2015 10:50 05/05/2015 14:32 05/05/2015 18:09 05/05/2015 21:14  Glucose-Capillary Latest Ref Range: 65-99 mg/dL 190 (H) 244 (H) 297 (H) 204 (H)   Results for TANAE, ROMER (MRN [id number]) as of 05/06/2015 09:40  Ref. Range 05/06/2015 07:37  Glucose-Capillary Latest Ref Range: 65-99 mg/dL 258 (H)    Admit with: AMS/ Hypoglycemia/ UTI/ Sepsis  History: DM, CKD, CVA, CHF  Home DM Meds: Lantus 35 units QAM       Novolog 0-12 units tid per SSI  Current Insulin Orders: Novolog Sensitive Correction Scale/ SSI (0-9 units) TID AC    MD- Note patient admitted with Hypoglycemia, however, patient now having sustained elevated glucose levels.  Please consider starting a portion of patient's home dose of Lantus- Recommend Lantus 9 units daily (25% of home dose to start)     --Will follow patient during hospitalization--  Wyn Quaker RN, MSN, CDE Diabetes Coordinator Inpatient Glycemic Control Team Team Pager: [phone] (8a-5p)

## 2015-05-07 DIAGNOSIS — I2510 Atherosclerotic heart disease of native coronary artery without angina pectoris: Secondary | ICD-10-CM

## 2015-05-07 DIAGNOSIS — R197 Diarrhea, unspecified: Secondary | ICD-10-CM

## 2015-05-07 DIAGNOSIS — I10 Essential (primary) hypertension: Secondary | ICD-10-CM

## 2015-05-07 DIAGNOSIS — Z515 Encounter for palliative care: Secondary | ICD-10-CM | POA: Insufficient documentation

## 2015-05-07 DIAGNOSIS — N184 Chronic kidney disease, stage 4 (severe): Secondary | ICD-10-CM

## 2015-05-07 DIAGNOSIS — E0865 Diabetes mellitus due to underlying condition with hyperglycemia: Secondary | ICD-10-CM

## 2015-05-07 DIAGNOSIS — E0821 Diabetes mellitus due to underlying condition with diabetic nephropathy: Secondary | ICD-10-CM

## 2015-05-07 DIAGNOSIS — Z8673 Personal history of transient ischemic attack (TIA), and cerebral infarction without residual deficits: Secondary | ICD-10-CM

## 2015-05-07 DIAGNOSIS — Z794 Long term (current) use of insulin: Secondary | ICD-10-CM

## 2015-05-07 DIAGNOSIS — I482 Chronic atrial fibrillation: Secondary | ICD-10-CM

## 2015-05-07 DIAGNOSIS — A419 Sepsis, unspecified organism: Principal | ICD-10-CM

## 2015-05-07 DIAGNOSIS — E039 Hypothyroidism, unspecified: Secondary | ICD-10-CM

## 2015-05-07 LAB — GLUCOSE, CAPILLARY
GLUCOSE-CAPILLARY: 309 mg/dL — AB (ref 65–99)
GLUCOSE-CAPILLARY: 294 mg/dL — AB (ref 65–99)
Glucose-Capillary: 133 mg/dL — ABNORMAL HIGH (ref 65–99)
Glucose-Capillary: 202 mg/dL — ABNORMAL HIGH (ref 65–99)

## 2015-05-07 MED ORDER — LORAZEPAM 2 MG/ML IJ SOLN
1.0000 mg | Freq: Once | INTRAMUSCULAR | Status: AC
  Administered 2015-05-07: 1 mg via INTRAVENOUS
  Filled 2015-05-07: qty 1

## 2015-05-07 MED ORDER — HALOPERIDOL LACTATE 5 MG/ML IJ SOLN
0.5000 mg | Freq: Two times a day (BID) | INTRAMUSCULAR | Status: DC
  Administered 2015-05-07 – 2015-05-12 (×10): 0.5 mg via INTRAVENOUS
  Filled 2015-05-07: qty 0.1
  Filled 2015-05-07 (×2): qty 1
  Filled 2015-05-07 (×2): qty 0.1
  Filled 2015-05-07 (×2): qty 1
  Filled 2015-05-07 (×4): qty 0.1
  Filled 2015-05-07 (×2): qty 1
  Filled 2015-05-07 (×3): qty 0.1

## 2015-05-07 NOTE — Progress Notes (Signed)
Bladder scanned patient. Highest volume seen on right side - 188 ml urine. Notified Dr. Domingo Cocking.

## 2015-05-07 NOTE — Progress Notes (Signed)
Physical Therapy Treatment Patient Details Name: [name] MRN: [id number] DOB: 06/28/1919 Today's Date: 05-09-2015    History of Present Illness 82 yo female admitted to [facility] with hyopglycemia and altered mental status.     PT Comments    Upon entering room, pt did open eyes as response to voice and tactile cueing, but them quickly shut them. Only responded correctly to nodding her head in "yes" to her first name is [name], no verbalization during entire session, only moaning sounds. Did attempt to sit patient at EOB hoping to increase stimuli and ability to awaken and respond, however pt with more of a extension pattern response in sitting with BUES and MAX A . Pt seemed much different today from previous ability and communication in PT eval from Saturday.   Follow Up Recommendations  SNF     Equipment Recommendations       Recommendations for Other Services       Precautions / Restrictions      Mobility  Bed Mobility Overal bed mobility: Needs Assistance Bed Mobility: Supine to Sit;Sit to Supine;Rolling Rolling: Max assist   Supine to sit: Max assist Sit to supine: Max assist   General bed mobility comments: Attempted sitting EOB todayw ith Max A , pt with great extension pattern in UEs and trunk moving from flexion to extension, not settling in centered position, unable to follow commnads . Attempted for 3 minutes , then pt pushed to Left sidelying to lie back down   Transfers                    Ambulation/Gait                 Stairs            Wheelchair Mobility    Modified Rankin (Stroke Patients Only)       Balance                                    Cognition                            Exercises      General Comments        Pertinent Vitals/Pain      Home Living                      Prior Function            PT Goals (current goals can now be found in the care plan section)       Frequency  Min 2X/week    PT Plan Current plan remains appropriate    Co-evaluation             End of Session   Activity Tolerance: Treatment limited secondary to medical complications (Comment)       Time: [id number] PT Time Calculation (min) (ACUTE ONLY): 12 min  Charges:  $Therapeutic Activity: 8-22 mins                    G CodesClide Dales 2015-05-09, 4:47 PM Clide Dales, PT Pager: [phone] 09-May-2015

## 2015-05-07 NOTE — Progress Notes (Signed)
OT Cancellation Note  Patient Details Name: [name] MRN: [id number] DOB: [birth year]   Cancelled Treatment:    Reason Eval/Treat Not Completed: Other (comment).  Per nurse, pt is disoriented and not following any commands. Will check back later in the week to see if pt will benefit from OT.  Allesha Aronoff 05/07/2015, 3:31 PM  Lesle Chris, OTR/L [phone] 05/07/2015

## 2015-05-07 NOTE — Progress Notes (Signed)
CSW continuing to follow.   Pt admitted from [facility] and plans to return.   Per chart, pt not yet medically ready for discharge.  CSW updated [employer] and pt insurance [place].  CSW to continue to follow.  Alison Murray, MSW, LCSW Clinical Social Work 5E coverage [phone]

## 2015-05-07 NOTE — Progress Notes (Signed)
[facility]                                                                              Patient Demographics  [name], is a 82 y.o. female, DOB - [birth year], [id number]  Admit date - 05/03/2015   Admitting Physician Ivor Costa, MD  Outpatient Primary MD for the patient is Jani Gravel, MD  LOS - 4   Chief Complaint  Patient presents with  . Hypoglycemia  . Altered Mental Status      HPI on 05/03/2015 by Dr. Elsie Lincoln is a 82 y.o. female with PMH of hypothyroidism, diastolic congestive heart failure, atrial flutter not on anticoagulants due to old age, dementia and high risk of fall, stroke, chronic kidney disease-stage IV, BPH, recent right transmetatarsal amputation due to chronic osteomyelitis, who presents with altered mental status and hypoglycemia.  Patient was recently hospitalized from 3/16 to 3.18 due to stroke. She also had right transmetatarsal amputation due to chronic osteomyelitis in that admission. She was discharged to nursing home for rehabilitation. She is getting Tigecycline by IV in SNF. Per report, pt was found to have AMS and hypoglycemia with blood sugar 45 in her nursing home. When I saw pt in ED, pt is not oriented x 3, could not provide any history. She moves extremities upon painful stimulus. She dose not seem to have cough, nausea, vomiting or diarrhea.  In ED, patient was found to have WBC 19.7, CBG 146 on arrival, positive urinalysis with large amount of leukocytes, hypothermia with temperature 94.7, oxygen desaturation to 86%, slightly bradycardia, stable renal function. Chest x-ray is negative for acute abnormalities. CT head is negative for acute intracranial abnormalities. Patient is admitted to inpatient for further intervention treatment.  Assessment & Plan   Sepsis secondary to UTI -Patient has positive urinalysis with large amount of leukocyte, consistent with UTI.  -Upon admission, patient had leukocytosis, hypothermia and  encephalopathy.  -WBC normalized, VSS stable -Patient was on Tygacil and as well as fluconazole -Infectious disease recommended discontinuing antibiotics and starting her on Primaxin -main problem and barrier for discharge is delirium and inability for meaningful PO intake/medications  Acute encephalopathy -Most likely due to UTI and sepsis. -Treating UTI as above -Follow clinical response   Hx of stroke/TIA -continue ASA -recent admission for TIA -completed work up approx 3 weeks ago -discussed with neurology and will monitor for now -patient is back to baseline -will consider using plavix and low dose ASA when able to take PO  Hypertension -continue IV metoprolol with holding parameter  History of Afib/flutter:  -CHA2DS2-VASc Score is 6, needs oral anticoagulation, but patient is not on [facility] due to old age, dementia and high risk of fall.  -HR is well controled. -on IV metoprolol -ASA for prevention  Dysphagia -Speech recommending full liquid diet -very high risk for aspiration  Hypothyroidism -TSH WNL -Continue IV synthroid, given dysphagia  Diabetes Mellitus, type II -Last A1c 11.9 on 04/11/15, poorly controlled.  -Patient is taking Lantus at home -Continue ISS -patient still not eating much and a high risk for hypoglycemia   S/P transmetatarsal amputation of right foot  -due to chronic osteomyelitis -will discontinue IV  Tigecycline as recommended by ID -consult wound care for dressing instructions and care  Chronic kidney disease (CKD), stage IV (severe)  -Creatinine is 1.4 on admission. -Continue to monitor [facility]  Hospital acquired delirium -Continue seroquel  Code Status: DNR   Family Communication: None at bedside  Disposition Plan: Admitted.  Possible discharge back to SNF in 1-2 days with palliative care to follow up.  Time Spent in minutes   30 minutes  Procedures  None  Consults   Neurology curbside (during acute event of TIA) Palliative  Care ID (Dr. Linus Salmons; Will d/c tigicycline and fluconazole; complete 5 days total of primaxin.)  DVT Prophylaxis  Lovenox  Lab Results  Component Value Date   PLT 356 05/05/2015    Medications  Scheduled Meds: . aspirin  150 mg Rectal Daily  . enoxaparin (LOVENOX) injection  30 mg Subcutaneous Q24H  . heparin flush  50 Units Intravenous Q12H  . imipenem-cilastatin  250 mg Intravenous Q6H  . insulin aspart  0-9 Units Subcutaneous TID WC  . levothyroxine  75 mcg Intravenous QAC breakfast  . metoprolol  5 mg Intravenous 3 times per day  . QUEtiapine  12.5 mg Oral QHS  . saccharomyces boulardii  250 mg Oral BID  . sodium chloride  2,500 mL Intravenous Once  . sodium chloride flush  3 mL Intravenous Q12H   Continuous Infusions: . sodium chloride 20 mL/hr at 05/05/15 1823   PRN Meds:.acetaminophen **OR** acetaminophen, hydrALAZINE, ondansetron (ZOFRAN) IV, sodium chloride flush  Antibiotics    Anti-infectives    Start     Dose/Rate Route Frequency Ordered Stop   05/05/15 1800  imipenem-cilastatin (PRIMAXIN) 250 mg in sodium chloride 0.9 % 100 mL IVPB     250 mg 200 mL/hr over 30 Minutes Intravenous Every 6 hours 05/05/15 1731     05/04/15 2359  imipenem-cilastatin (PRIMAXIN) 250 mg in sodium chloride 0.9 % 100 mL IVPB  Status:  Discontinued     250 mg 200 mL/hr over 30 Minutes Intravenous Every 12 hours 05/03/15 1323 05/04/15 0334   05/04/15 2200  fluconazole (DIFLUCAN) IVPB 100 mg  Status:  Discontinued     100 mg 50 mL/hr over 60 Minutes Intravenous Every 24 hours 05/04/15 2131 05/05/15 1707   05/04/15 0400  imipenem-cilastatin (PRIMAXIN) 250 mg in sodium chloride 0.9 % 100 mL IVPB  Status:  Discontinued     250 mg 200 mL/hr over 30 Minutes Intravenous Every 6 hours 05/04/15 0334 05/04/15 2121   05/03/15 1200  imipenem-cilastatin (PRIMAXIN) 250 mg in sodium chloride 0.9 % 100 mL IVPB  Status:  Discontinued     250 mg 200 mL/hr over 30 Minutes Intravenous 4 times per day  05/03/15 0502 05/03/15 1323   05/03/15 1000  tigecycline (TYGACIL) injection 50 mg  Status:  Discontinued     50 mg Intravenous Every 12 hours 05/03/15 0450 05/03/15 0454   05/03/15 1000  tigecycline (TYGACIL) 50 mg in sodium chloride 0.9 % 100 mL IVPB  Status:  Discontinued     50 mg 200 mL/hr over 30 Minutes Intravenous Every 12 hours 05/03/15 0455 05/05/15 1707   05/03/15 0515  imipenem-cilastatin (PRIMAXIN) 250 mg in sodium chloride 0.9 % 100 mL IVPB     250 mg 200 mL/hr over 30 Minutes Intravenous STAT 05/03/15 0501 05/03/15 0635   05/03/15 0430  vancomycin (VANCOCIN) IVPB 1000 mg/200 mL premix  Status:  Discontinued     1,000 mg 200 mL/hr over 60 Minutes Intravenous  Once 05/03/15  [id number] 05/03/15 0450   05/03/15 0430  piperacillin-tazobactam (ZOSYN) IVPB 3.375 g  Status:  Discontinued     3.375 g 100 mL/hr over 30 Minutes Intravenous  Once 05/03/15 0416 05/03/15 0436     Subjective:   [name] seen and examined today.  Patient currently nonverbal, moves all extremities without purpose.  Does not respond to questions.   Objective:   Filed Vitals:   05/05/15 1606 05/05/15 2116 05/06/15 0520 05/06/15 2235  BP: 193/65 147/70 177/59 136/78  Pulse:  66 72 68  Temp:  97.7 F (36.5 C) 97.9 F (36.6 C) 98.2 F (36.8 C)  TempSrc:  Oral Axillary Axillary  Resp:  19 18 18   Weight:      SpO2:  95% 97% 98%    Wt Readings from Last 3 Encounters:  05/04/15 71.668 kg (158 lb)  04/30/15 74.844 kg (165 lb)  04/28/15 74.844 kg (165 lb)    No intake or output data in the 24 hours ending 05/07/15 1402  Exam  General: Well developed, Elderly, NAD  HEENT: NCAT, mucous membranes moist.   Cardiovascular: S1 S2 auscultated, regular  Respiratory: Clear to auscultation bilaterally   Abdomen: Soft, nontender, nondistended, + bowel sounds  Extremities: warm dry without cyanosis clubbing or edema. RLE wrapped.  Neuro: Unable to fully assess.  Moves all extremities   Data Review     Micro Results Recent Results (from the past 240 hour(s))  Urine culture     Status: Abnormal   Collection Time: 05/03/15  2:55 AM  Result Value Ref Range Status   Specimen Description URINE, CATHETERIZED  Final   Special Requests Normal  Final   Culture >=100,000 COLONIES/mL YEAST (A)  Final   Report Status 05/04/2015 FINAL  Final  Blood culture (routine x 2)     Status: None (Preliminary result)   Collection Time: 05/03/15  4:03 AM  Result Value Ref Range Status   Specimen Description BLOOD RIGHT HAND  Final   Special Requests BOTTLES DRAWN AEROBIC AND ANAEROBIC 5CC  Final   Culture   Final    NO GROWTH 4 DAYS Performed at [facility]    Report Status PENDING  Incomplete  Blood culture (routine x 2)     Status: None (Preliminary result)   Collection Time: 05/03/15  4:09 AM  Result Value Ref Range Status   Specimen Description BLOOD LEFT HAND  Final   Special Requests BOTTLES DRAWN AEROBIC AND ANAEROBIC 5CC  Final   Culture   Final    NO GROWTH 4 DAYS Performed at [facility]    Report Status PENDING  Incomplete  C difficile quick scan w PCR reflex     Status: None   Collection Time: 05/06/15  2:51 AM  Result Value Ref Range Status   C Diff antigen NEGATIVE NEGATIVE Final   C Diff toxin NEGATIVE NEGATIVE Final   C Diff interpretation Negative for toxigenic C. difficile  Final    Radiology Reports Dg Chest 2 View  05/03/2015  CLINICAL DATA:  Hypoglycemia.  Cough and congestion. EXAM: CHEST  2 VIEW COMPARISON:  04/10/2015 FINDINGS: There is a right upper extremity PICC line extending into the low SVC. There is mild vascular and interstitial prominence which is unchanged. No consolidation. No effusions. Unchanged mild cardiomegaly. IMPRESSION: Unchanged mild cardiomegaly and vascular/interstitial prominence. No consolidation or effusion. Electronically Signed   By: Andreas Newport M.D.   On: 05/03/2015 01:51   Ct Head Wo Contrast  05/04/2015  CLINICAL DATA:   Sudden onset of altered mental status approximately 45 minutes ago. EXAM: CT HEAD WITHOUT CONTRAST TECHNIQUE: Contiguous axial images were obtained from the base of the skull through the vertex without intravenous contrast. COMPARISON:  05/03/2015 FINDINGS: The ventricles are normal in configuration. There is ventricular and sulcal enlargement reflecting moderate atrophy. No hydrocephalus. There are no parenchymal masses or mass effect. There is no evidence of a recent cortical infarct. Patchy white matter hypoattenuation is noted, stable from the prior exam, consistent with mild chronic microvascular ischemic change. There are no extra-axial masses or abnormal fluid collections. There is no intracranial hemorrhage. The visualized sinuses are clear. IMPRESSION: 1. No acute intracranial abnormalities. No evidence of a recent cortical infarct or of intracranial hemorrhage. 2. Atrophy and mild chronic microvascular ischemic change. These results were called by telephone at the time of interpretation on 05/04/2015 at 11:24 am to nurse Alease Medina, who verbally acknowledged these results. Electronically Signed   By: Lajean Manes M.D.   On: 05/04/2015 11:25   Ct Head Wo Contrast  05/03/2015  CLINICAL DATA:  Hypoglycemia.  Altered mental status. EXAM: CT HEAD WITHOUT CONTRAST TECHNIQUE: Contiguous axial images were obtained from the base of the skull through the vertex without intravenous contrast. COMPARISON:  04/10/2015 FINDINGS: There is no intracranial hemorrhage, mass or evidence of acute infarction. There is no extra-axial fluid collection. There is moderate generalized atrophy. There is white matter hypodensity, chronic and likely due to small vessel disease. No acute intracranial findings are evident. No interval change is evident. No bony abnormalities evident. The visible paranasal sinuses are clear. IMPRESSION: Generalized atrophy and chronic white matter hypodensities, likely small vessel disease. No acute  intracranial finding. Electronically Signed   By: Andreas Newport M.D.   On: 05/03/2015 01:52   Ct Head Wo Contrast  04/10/2015  CLINICAL DATA:  Slurred speech EXAM: CT HEAD WITHOUT CONTRAST TECHNIQUE: Contiguous axial images were obtained from the base of the skull through the vertex without intravenous contrast. COMPARISON:  08/27/2013 FINDINGS: The bony calvarium is intact. No gross soft tissue abnormality is noted. Diffuse atrophic changes are noted. Areas of chronic white matter ischemic change are seen. No findings to suggest acute hemorrhage, acute infarction or space-occupying mass lesion are noted. IMPRESSION: Chronic atrophic and ischemic changes.  No acute abnormality noted. These results were called by telephone at the time of interpretation on 04/10/2015 at 10:38 am to Dr. Silverio Decamp, who verbally acknowledged these results. Electronically Signed   By: Inez Catalina M.D.   On: 04/10/2015 10:38   Mr Virgel Paling Wo Contrast  04/10/2015  CLINICAL DATA:  82 year old diabetic hypertensive female with prior stroke presenting with near syncopal episode and slurred speech. Subsequent encounter. EXAM: MRI HEAD WITHOUT CONTRAST MRA HEAD WITHOUT CONTRAST TECHNIQUE: Multiplanar, multiecho pulse sequences of the brain and surrounding structures were obtained without intravenous contrast. Angiographic images of the head were obtained using MRA technique without contrast. COMPARISON:  04/10/2015 CT.  02/04/2012 MR and MR angiogram. FINDINGS: MRI HEAD FINDINGS No acute infarct or intracranial hemorrhage. Moderate global atrophy without hydrocephalus. Mild to moderate chronic small vessel disease changes. No intracranial mass lesion noted on this unenhanced exam. Partial opacification left mastoid air cells without obstructing lesion of eustachian tube. Post lens replacement otherwise orbital structures unremarkable. Cervical spondylotic changes with spinal stenosis and cord flattening most notable C2-3 followed by the  C3-4 and C4-5 level. MRA HEAD FINDINGS Anterior circulation without medium or large size vessel significant stenosis or occlusion.  Moderate moderate narrowing of portions of the middle cerebral artery branches and A2 segment anterior cerebral artery bilaterally. No high-grade stenosis distal vertebral arteries or basilar artery. Moderate posterior circulation branch vessel narrowing and irregularity including high-grade stenosis proximal and distal aspect of the posterior cerebral arteries more notable on the right. No aneurysm noted. IMPRESSION: MRI HEAD No acute infarct or intracranial hemorrhage. Moderate global atrophy without hydrocephalus. Mild to moderate chronic small vessel disease changes. No intracranial mass lesion noted on this unenhanced exam. Partial opacification left mastoid air cells. Cervical spondylotic changes with spinal stenosis and cord flattening most notable C2-3 followed by the C3-4 and C4-5 level. MRA HEAD Anterior circulation without medium or large size vessel significant stenosis or occlusion. Moderate moderate narrowing of portions of the middle cerebral artery branches and A2 segment anterior cerebral artery bilaterally. No high-grade stenosis distal vertebral arteries or basilar artery. Moderate posterior circulation branch vessel narrowing and irregularity. High-grade stenosis proximal and distal aspect of the posterior cerebral arteries more notable on the right. Electronically Signed   By: Genia Del M.D.   On: 04/10/2015 14:52   Mr Brain Wo Contrast  04/10/2015  CLINICAL DATA:  82 year old diabetic hypertensive female with prior stroke presenting with near syncopal episode and slurred speech. Subsequent encounter. EXAM: MRI HEAD WITHOUT CONTRAST MRA HEAD WITHOUT CONTRAST TECHNIQUE: Multiplanar, multiecho pulse sequences of the brain and surrounding structures were obtained without intravenous contrast. Angiographic images of the head were obtained using MRA technique  without contrast. COMPARISON:  04/10/2015 CT.  02/04/2012 MR and MR angiogram. FINDINGS: MRI HEAD FINDINGS No acute infarct or intracranial hemorrhage. Moderate global atrophy without hydrocephalus. Mild to moderate chronic small vessel disease changes. No intracranial mass lesion noted on this unenhanced exam. Partial opacification left mastoid air cells without obstructing lesion of eustachian tube. Post lens replacement otherwise orbital structures unremarkable. Cervical spondylotic changes with spinal stenosis and cord flattening most notable C2-3 followed by the C3-4 and C4-5 level. MRA HEAD FINDINGS Anterior circulation without medium or large size vessel significant stenosis or occlusion. Moderate moderate narrowing of portions of the middle cerebral artery branches and A2 segment anterior cerebral artery bilaterally. No high-grade stenosis distal vertebral arteries or basilar artery. Moderate posterior circulation branch vessel narrowing and irregularity including high-grade stenosis proximal and distal aspect of the posterior cerebral arteries more notable on the right. No aneurysm noted. IMPRESSION: MRI HEAD No acute infarct or intracranial hemorrhage. Moderate global atrophy without hydrocephalus. Mild to moderate chronic small vessel disease changes. No intracranial mass lesion noted on this unenhanced exam. Partial opacification left mastoid air cells. Cervical spondylotic changes with spinal stenosis and cord flattening most notable C2-3 followed by the C3-4 and C4-5 level. MRA HEAD Anterior circulation without medium or large size vessel significant stenosis or occlusion. Moderate moderate narrowing of portions of the middle cerebral artery branches and A2 segment anterior cerebral artery bilaterally. No high-grade stenosis distal vertebral arteries or basilar artery. Moderate posterior circulation branch vessel narrowing and irregularity. High-grade stenosis proximal and distal aspect of the  posterior cerebral arteries more notable on the right. Electronically Signed   By: Genia Del M.D.   On: 04/10/2015 14:52   Ir Fluoro Guide Cv Line Right  04/29/2015  INDICATION: 82 year old female with a history of foot infection and requirement of antibiotics. EXAM: PICC LINE PLACEMENT WITH ULTRASOUND AND FLUOROSCOPIC GUIDANCE MEDICATIONS: None ANESTHESIA/SEDATION: None FLUOROSCOPY TIME:  Fluoroscopy Time: 0 minutes 12 seconds (1 mGy). COMPLICATIONS: None PROCEDURE: The patient was advised of the  possible risks and complications and agreed to undergo the procedure. The patient was then brought to the angiographic suite for the procedure. The right arm was prepped with chlorhexidine, draped in the usual sterile fashion using maximum barrier technique (cap and mask, sterile gown, sterile gloves, large sterile sheet, hand hygiene and cutaneous antisepsis) and infiltrated locally with 1% Lidocaine. Ultrasound demonstrated patency of the right basilic vein, and this was documented with an image. Under real-time ultrasound guidance, this vein was accessed with a 21 gauge micropuncture needle and image documentation was performed. A 0.018 wire was introduced in to the vein. Over this, a 5 [place] single lumen power injectable PICC was advanced to the superior SVC/right atrial junction. Fluoroscopy during the procedure and fluoro spot radiograph confirms appropriate catheter position. The catheter was flushed and covered with asterile dressing. Catheter length: 40 cm IMPRESSION: Status post right basilic vein PICC placement. 40 cm single-lumen power injectable PICC placed. Catheter ready for use. Signed, Dulcy Fanny. Earleen Newport, DO [facility]iology Electronically Signed   By: Corrie Mckusick D.O.   On: 04/29/2015 15:05   Ir US Guide Vasc Access Right  04/29/2015  INDICATION: 82 year old female with a history of foot infection and requirement of antibiotics. EXAM: PICC LINE  PLACEMENT WITH ULTRASOUND AND FLUOROSCOPIC GUIDANCE MEDICATIONS: None ANESTHESIA/SEDATION: None FLUOROSCOPY TIME:  Fluoroscopy Time: 0 minutes 12 seconds (1 mGy). COMPLICATIONS: None PROCEDURE: The patient was advised of the possible risks and complications and agreed to undergo the procedure. The patient was then brought to the angiographic suite for the procedure. The right arm was prepped with chlorhexidine, draped in the usual sterile fashion using maximum barrier technique (cap and mask, sterile gown, sterile gloves, large sterile sheet, hand hygiene and cutaneous antisepsis) and infiltrated locally with 1% Lidocaine. Ultrasound demonstrated patency of the right basilic vein, and this was documented with an image. Under real-time ultrasound guidance, this vein was accessed with a 21 gauge micropuncture needle and image documentation was performed. A 0.018 wire was introduced in to the vein. Over this, a 5 [place] single lumen power injectable PICC was advanced to the superior SVC/right atrial junction. Fluoroscopy during the procedure and fluoro spot radiograph confirms appropriate catheter position. The catheter was flushed and covered with asterile dressing. Catheter length: 40 cm IMPRESSION: Status post right basilic vein PICC placement. 40 cm single-lumen power injectable PICC placed. Catheter ready for use. Signed, Dulcy Fanny. Earleen Newport, DO [facility] Radiology Electronically Signed   By: Corrie Mckusick D.O.   On: 04/29/2015 15:05   Dg Chest Port 1 View  04/10/2015  CLINICAL DATA:  Chronic nonproductive cough EXAM: PORTABLE CHEST 1 VIEW COMPARISON:  October 24, 2014 FINDINGS: There is no edema or consolidation. The heart size and pulmonary vascularity are normal. No adenopathy. No bone lesions. IMPRESSION: No edema or consolidation. Electronically Signed   By: Lowella Grip III M.D.   On: 04/10/2015 10:52    CBC  Recent Labs Lab 05/03/15 0139  05/03/15 0147 05/03/15 0553 05/05/15 0422  WBC 19.7*  --  13.1* 9.7  HGB 10.6* 11.2* 9.5* 10.6*  HCT 32.2* 33.0* 29.3* 32.2*  PLT 340  --  331 356  MCV 84.1  --  83.0 81.3  MCH 27.7  --  26.9 26.8  MCHC 32.9  --  32.4 32.9  RDW 15.1  --  15.1 14.6  LYMPHSABS 1.8  --   --   --   MONOABS 1.1*  --   --   --  EOSABS 0.3  --   --   --   BASOSABS 0.1  --   --   --     Chemistries   Recent Labs Lab 05/03/15 0147 05/03/15 0553 05/05/15 0422  NA 138 136 137  K 4.9 5.0 4.5  CL 104 107 108  CO2  --  21* 23  GLUCOSE 221* 174* 212*  BUN 45* 51* 32*  CREATININE 1.40* 1.37* 1.21*  CALCIUM  --  8.1* 8.3*  AST  --  12*  --   ALT  --  12*  --   ALKPHOS  --  67  --   BILITOT  --  0.3  --    ------------------------------------------------------------------------------------------------------------------ estimated creatinine clearance is 29.3 mL/min (by C-G formula based on Cr of 1.21). ------------------------------------------------------------------------------------------------------------------ No results for input(s): HGBA1C in the last 72 hours. ------------------------------------------------------------------------------------------------------------------ No results for input(s): CHOL, HDL, LDLCALC, TRIG, CHOLHDL, LDLDIRECT in the last 72 hours. ------------------------------------------------------------------------------------------------------------------ No results for input(s): TSH, T4TOTAL, T3FREE, THYROIDAB in the last 72 hours.  Invalid input(s): FREET3 ------------------------------------------------------------------------------------------------------------------ No results for input(s): VITAMINB12, FOLATE, FERRITIN, TIBC, IRON, RETICCTPCT in the last 72 hours.  Coagulation profile  Recent Labs Lab 05/03/15 0553  INR 1.34    No results for input(s): DDIMER in the last 72 hours.  Cardiac Enzymes No results for input(s): CKMB, TROPONINI, MYOGLOBIN in the  last 168 hours.  Invalid input(s): CK ------------------------------------------------------------------------------------------------------------------ Invalid input(s): POCBNP    Gordon Vandunk D.O. on 05/07/2015 at 2:02 PM  Between 7am to 7pm - Pager - [phone]  After 7pm go to [email or web address] - password [email or web address]  And look for the night coverage person covering for me after hours  [facility] Office  [phone]

## 2015-05-07 NOTE — Progress Notes (Signed)
Daily Progress Note   Patient Name: [name]       Date: 05/07/2015 DOB: 09-29-1919  Age: 82 y.o. MRN#: [id number] Attending Physician: Cristal Ford, DO Primary Care Physician: Jani Gravel, MD Admit Date: 05/03/2015  Reason for Consultation/Follow-up: Establishing goals of care  Subjective: I met today with Ms. Lampkins and soke with her daughter, [name], via phone.  Her daughter reports that her mother did not verbalize at all when visiting with her today. She thinks her cognitive status continues to decline. Cannot obtain history from her today based on her mental status.  I discussed with her daughter my concern that her mother's overall nutrition, functional status, and cognition continues to decline. She will call to let me know when she is going to visit the hospital tomorrow in order for [place] to set up a meeting to discuss further goals of care.  Length of Stay: 4 days  Current Medications: Scheduled Meds:  . aspirin  150 mg Rectal Daily  . enoxaparin (LOVENOX) injection  30 mg Subcutaneous Q24H  . haloperidol lactate  0.5 mg Intravenous BID  . heparin flush  50 Units Intravenous Q12H  . imipenem-cilastatin  250 mg Intravenous Q6H  . insulin aspart  0-9 Units Subcutaneous TID WC  . levothyroxine  75 mcg Intravenous QAC breakfast  . metoprolol  5 mg Intravenous 3 times per day  . saccharomyces boulardii  250 mg Oral BID  . sodium chloride  2,500 mL Intravenous Once  . sodium chloride flush  3 mL Intravenous Q12H    Continuous Infusions: . sodium chloride 20 mL/hr at 05/05/15 1823    PRN Meds: acetaminophen **OR** acetaminophen, hydrALAZINE, ondansetron (ZOFRAN) IV, sodium chloride flush  Physical Exam: Physical Exam             Weak, elderly lady, resting in bed S1  S2 Shallow clear anteriorly Abdomen soft No edema, s/p tm amputn Opens eyes, Only repeats "no" repeatedly today.   Vital Signs: BP 136/78 mmHg  Pulse 68  Temp(Src) 98.2 F (36.8 C) (Axillary)  Resp 18  Wt 71.668 kg (158 lb)  SpO2 98% SpO2: SpO2: 98 % O2 Device: O2 Device: Not Delivered O2 Flow Rate:    Intake/output summary:  No intake or output data in the 24 hours ending 05/07/15 1836 LBM: Last BM  Date: 05/07/15 Baseline Weight: Weight: 29.7 kg (65 lb 7.6 oz) (See Progress Note.) Most recent weight: Weight: 71.668 kg (158 lb)       Palliative Assessment/Data: Flowsheet Rows        Most Recent Value   Intake Tab    Referral Department  Hospitalist   Unit at Time of Referral  Med/Surg Unit   Palliative Care Primary Diagnosis  Neurology   Date Notified  05/03/15   Palliative Care Type  Return patient Palliative Care   Reason for referral  Clarify Goals of Care   Date of Admission  05/03/15   Date first seen by Palliative Care  05/04/15   # of days Palliative referral response time  1 Day(s)   # of days IP prior to Palliative referral  0   Clinical Assessment    Palliative Performance Scale Score  30%   Pain Max last 24 hours  3   Pain Min Last 24 hours  2   Dyspnea Max Last 24 Hours  2   Dyspnea Min Last 24 hours  1   Psychosocial & Spiritual Assessment    Palliative Care Outcomes    Patient/Family meeting held?  No   Who was at the meeting?  patient, daughter over the phone.    Palliative Care follow-up planned  Yes, Facility   Other Treatment Preference Instructions  needs palliative care at SNF rehab on D/C       Additional Data Reviewed: CBC    Component Value Date/Time   WBC 9.7 05/05/2015 0422   WBC 9.5 04/29/2015   RBC 3.96 05/05/2015 0422   RBC 3.45* 06/07/2008 1040   HGB 10.6* 05/05/2015 0422   HCT 32.2* 05/05/2015 0422   PLT 356 05/05/2015 0422   MCV 81.3 05/05/2015 0422   MCH 26.8 05/05/2015 0422   MCHC 32.9 05/05/2015 0422   RDW 14.6  05/05/2015 0422   LYMPHSABS 1.8 05/03/2015 0139   MONOABS 1.1* 05/03/2015 0139   EOSABS 0.3 05/03/2015 0139   BASOSABS 0.1 05/03/2015 0139    CMP     Component Value Date/Time   NA 137 05/05/2015 0422   NA 141 04/29/2015   K 4.5 05/05/2015 0422   CL 108 05/05/2015 0422   CO2 23 05/05/2015 0422   GLUCOSE 212* 05/05/2015 0422   BUN 32* 05/05/2015 0422   BUN 45* 04/29/2015   CREATININE 1.21* 05/05/2015 0422   CREATININE 1.8* 04/29/2015   CALCIUM 8.3* 05/05/2015 0422   PROT 6.1* 05/03/2015 0553   ALBUMIN 2.8* 05/03/2015 0553   AST 12* 05/03/2015 0553   ALT 12* 05/03/2015 0553   ALKPHOS 67 05/03/2015 0553   BILITOT 0.3 05/03/2015 0553   GFRNONAA 37* 05/05/2015 0422   GFRAA 43* 05/05/2015 0422       Problem List:  Patient Active Problem List   Diagnosis Date Noted  . Acute delirium   . Diarrhea   . Encounter for palliative care   . Goals of care, counseling/discussion   . Urinary tract infection, site not specified   . Chronic atrial fibrillation ([place])   . UTI (lower urinary tract infection) 05/03/2015  . Sepsis ([place]) 05/03/2015  . Acute encephalopathy 05/03/2015  . Hypothermia 05/03/2015  . Hypoglycemia   . TIA (transient ischemic attack)   . Type 2 diabetes mellitus with circulatory disorder ([place])   . Dementia   . CVA (cerebral infarction) 04/10/2015  . S/P transmetatarsal amputation of right foot ([place]) 04/10/2015  . History of CVA w/  right side deficits 04/05/2015  . Chronic osteomyelitis ([place]) 04/05/2015  . Diabetes mellitus with renal manifestations, uncontrolled ([place]) 10/21/2014  . Chronic ulcer of left foot ([place]) 10/21/2014  . Chronic diastolic CHF (congestive heart failure) ([place]) 10/21/2014  . Diabetic neuropathy ([place]) 08/09/2014  . Coronary atherosclerosis of native coronary artery 06/07/2012  . Chronic kidney disease (CKD), stage IV (severe) ([place]) 04/12/2012  . Hypothyroidism 04/21/2011  . HYPERTENSION, BENIGN ESSENTIAL 12/08/2006     Palliative  Care Assessment & Plan    1.Code Status:  DNR    Code Status Orders        Start     Ordered   05/04/15 1441  Do not attempt resuscitation (DNR)   Continuous    Question Answer Comment  In the event of cardiac or respiratory ARREST Do not call a "code blue"   In the event of cardiac or respiratory ARREST Do not perform Intubation, CPR, defibrillation or ACLS   In the event of cardiac or respiratory ARREST Use medication by any route, position, wound care, and other measures to relive pain and suffering. May use oxygen, suction and manual treatment of airway obstruction as needed for comfort.      05/04/15 1440    Code Status History    Date Active Date Inactive Code Status Order ID Comments User Context   05/03/2015  4:53 AM 05/04/2015  2:40 PM Full Code [id number]  Ivor Costa, MD ED   04/10/2015  6:53 PM 04/12/2015 11:07 PM Full Code [id number]  Samella Parr, NP Inpatient   04/04/2015  9:56 PM 04/08/2015  6:23 PM Full Code [id number]  Norval Morton, MD ED   10/22/2014 11:27 AM 10/25/2014  8:06 PM DNR [id number]  Thurnell Lose, MD Inpatient   10/21/2014  5:31 PM 10/22/2014 11:27 AM Full Code [id number]  Robbie Lis, MD Inpatient   08/08/2014  5:20 PM 08/13/2014  6:08 PM Full Code [id number]  Theodis Blaze, MD Inpatient   11/18/2013 12:57 PM 11/21/2013  4:33 PM Full Code [id number]  Barton Dubois, MD Inpatient   08/28/2013 12:55 AM 08/30/2013  7:16 PM Full Code [id number]  Hosie Poisson, MD Inpatient   04/14/2012  7:40 PM 04/17/2012  5:41 PM Full Code [id number]  Newt Minion, MD Inpatient   04/12/2012 12:18 AM 04/14/2012  7:40 PM Full Code [id number]  Janell Quiet, MD Inpatient   04/21/2011  9:40 PM 04/27/2011  7:19 PM Full Code [id number]  Janae Bridgeman, RN Inpatient       2. Goals of Care/Additional Recommendations:  Limitations on Scope of Treatment: DNR DNI, but no other limits on care, full scope of medical treatment such as fluids, antibiotics.   I'm concerned she has developed hypoactive  delirium. Continue delirium protocol. Reviewed medications. She did not receive Seroquel last evening due to not being able to take by mouth. Will plan for addition of Haldol 0.5 mg IV twice a day.  I discussed this with patient's daughter who is in agreement that severity of her mother's condition warrants risk  associated with this low scheduled dose of IV Haldol.  She has been noted having frequent urination. Will also check bladder scan just to ensure she is not having retention with overflow incontinence. She is having regular bowel movements.  Desire for further Chaplaincy support:no  Psycho-social Needs: Caregiving  Support/Resources  3. Symptom Management:      1. As above   4. Palliative Prophylaxis:   Delirium Protocol  Standard delirium  management (adapted from NICE guidelines 2011 for prevention of delirium):  Provide continuity of care when possible (avoid frequent changing of surroundings and staff).  Frequent reorientation to time with:  A clock should always be visible.  Make sure Calendar/white board is updated.  Lights on/blinds open during the day and off/closed at night.  Encourage frequent family visits.  Monitor for and treat dehydration/constipation.  Optimize oxygen saturation.  Avoid catheters and IV's when possible and look for/treat infections.  Encourage early mobility.  Assess and treat pain.  Ensure adequate nutrition and functioning dentures.  Address reversible causes of hearing and visual impairment:  Use pocket talker if hearing aids are unavailable.  Avoid sleep disturbance (normalize sleep/wake cycle).  Minimize disturbances and consider NOT obtaining vitals at night if possible.  Review Medications to avoid polypharmacy and avoid deliriogenic medications when possible:  Benzodiazepines.  Dihydropyridines.  Antihistamines.  Anticholinergics.  (Possibly avoid: H2 blockers, tricyclic antidepressants, antiparkinson medications, steroids, NSAID's).    5. Prognosis: Unable to determine but appears guarded given advanced age and multiple acute and underlying co morbidities including compromised nutrition. I'm concerned that if her condition does not improve in the near future this will be an irreversible decline.   6. Discharge Planning:  [facility] for rehab with Palliative care service follow-up most likely   Care plan was discussed with  Patient, her daughter, and Dr. Ree Kida   Thank you for allowing the Palliative Medicine Team to assist in the care of this patient.   Time In:  1520 Time Out: 1550 Total Time 30 Prolonged Time Billed  no        [phone] Micheline Rough, MD  05/07/2015, 6:36 PM  Please contact Palliative Medicine Team phone at [phone] for questions and concerns.

## 2015-05-08 DIAGNOSIS — Z89431 Acquired absence of right foot: Secondary | ICD-10-CM

## 2015-05-08 LAB — CBC
HEMATOCRIT: 35.1 % — AB (ref 36.0–46.0)
Hemoglobin: 11.3 g/dL — ABNORMAL LOW (ref 12.0–15.0)
MCH: 26.8 pg (ref 26.0–34.0)
MCHC: 32.2 g/dL (ref 30.0–36.0)
MCV: 83.4 fL (ref 78.0–100.0)
PLATELETS: 392 10*3/uL (ref 150–400)
RBC: 4.21 MIL/uL (ref 3.87–5.11)
RDW: 15.3 % (ref 11.5–15.5)
WBC: 14 10*3/uL — AB (ref 4.0–10.5)

## 2015-05-08 LAB — CULTURE, BLOOD (ROUTINE X 2)
Culture: NO GROWTH
Culture: NO GROWTH

## 2015-05-08 LAB — BASIC METABOLIC PANEL
ANION GAP: 12 (ref 5–15)
BUN: 35 mg/dL — ABNORMAL HIGH (ref 6–20)
CO2: 21 mmol/L — AB (ref 22–32)
Calcium: 8.7 mg/dL — ABNORMAL LOW (ref 8.9–10.3)
Chloride: 114 mmol/L — ABNORMAL HIGH (ref 101–111)
Creatinine, Ser: 1.54 mg/dL — ABNORMAL HIGH (ref 0.44–1.00)
GFR, EST AFRICAN AMERICAN: 32 mL/min — AB (ref >60)
GFR, EST NON AFRICAN AMERICAN: 28 mL/min — AB (ref >60)
GLUCOSE: 278 mg/dL — AB (ref 65–99)
POTASSIUM: 4.4 mmol/L (ref 3.5–5.1)
Sodium: 147 mmol/L — ABNORMAL HIGH (ref 135–145)

## 2015-05-08 LAB — GLUCOSE, CAPILLARY
GLUCOSE-CAPILLARY: 240 mg/dL — AB (ref 65–99)
GLUCOSE-CAPILLARY: 241 mg/dL — AB (ref 65–99)
Glucose-Capillary: 275 mg/dL — ABNORMAL HIGH (ref 65–99)
Glucose-Capillary: 233 mg/dL — ABNORMAL HIGH (ref 65–99)

## 2015-05-08 MED ORDER — SODIUM CHLORIDE 0.45 % IV SOLN
INTRAVENOUS | Status: DC
  Administered 2015-05-08 – 2015-05-10 (×5): via INTRAVENOUS

## 2015-05-08 NOTE — Progress Notes (Signed)
Pharmacy Antibiotic Note  [name] is a 82 y.o. female with recent R transmetatarsal amputation due to chronic osteomyelitis admitted on 05/03/2015 with AMS, hypoglycemia, UTI, and sepsis.  Pharmacy has been consulted for Primaxin dosing for UTI as recommended per ID.  Plan: Primaxin 250mg  IV q6h resumed 4/10 Day 6 inpatient abx SCr increasing, no dose change today  Weight: 158 lb (71.668 kg)  Temp (24hrs), Avg:98.5 F (36.9 C), Min:98.4 F (36.9 C), Max:98.6 F (37 C)   Recent Labs Lab 05/03/15 0139 05/03/15 0147 05/03/15 0553 05/03/15 0914 05/05/15 0422 05/08/15 0420  WBC 19.7*  --  13.1*  --  9.7 14.0*  CREATININE  --  1.40* 1.37*  --  1.21* 1.54*  LATICACIDVEN  --   --  0.8 0.9  --   --     Estimated Creatinine Clearance: 23 mL/min (by C-G formula based on Cr of 1.54).    Allergies  Allergen Reactions  . Corn-Containing Products Other (See Comments)    Reaction:  Unknown   . Ephedrine Other (See Comments)    Reaction:  Unknown   . Morphine Other (See Comments)    Reaction:  Difficulty waking up and talking with this medication.    . Rocephin [Ceftriaxone] Other (See Comments)    Possible anaphylaxis reaction as documented previously in patient's medical history     Antimicrobials this admission: PTA Tigecycline >> 4/10  4/8 Primaxin >>4/9, restart 4/10 >> 4/9 Fluconazole >> 4/10  Microbiology results: 4/8 BCx: NGTD 4/8 UCx: > 100,000 colonies/mL yeast (final) - do not treat per ID 411 CDiff: neg/neg  Thank you for allowing pharmacy to be a part of this patient's care.  Minda Ditto PharmD Pager [phone] 05/08/2015, 11:56 AM

## 2015-05-08 NOTE — Progress Notes (Signed)
SLP Cancellation Note  Patient Details Name: OSHEA MAR MRN: [id number] DOB: Mar 18, 1919   Cancelled treatment:       Reason Eval/Treat Not Completed: Other (comment) (rn reports pt remains lethargic, she plans to contact Dr Domingo Cocking to meet with pt's daughter who is present)   Luanna Salk, [place] [facility] [phone]

## 2015-05-08 NOTE — Progress Notes (Signed)
[facility]                                                                              Patient Demographics  [name], is a 82 y.o. female, DOB - 01-21-1920, [id number]  Admit date - 05/03/2015   Admitting Physician Ivor Costa, MD  Outpatient Primary MD for the patient is Jani Gravel, MD  LOS - 5   Chief Complaint  Patient presents with  . Hypoglycemia  . Altered Mental Status      HPI on 05/03/2015 by Dr. Elsie Lincoln is a 82 y.o. female with PMH of hypothyroidism, diastolic congestive heart failure, atrial flutter not on anticoagulants due to old age, dementia and high risk of fall, stroke, chronic kidney disease-stage IV, BPH, recent right transmetatarsal amputation due to chronic osteomyelitis, who presents with altered mental status and hypoglycemia.  Patient was recently hospitalized from 3/16 to 3.18 due to stroke. She also had right transmetatarsal amputation due to chronic osteomyelitis in that admission. She was discharged to nursing home for rehabilitation. She is getting Tigecycline by IV in SNF. Per report, pt was found to have AMS and hypoglycemia with blood sugar 45 in her nursing home. When I saw pt in ED, pt is not oriented x 3, could not provide any history. She moves extremities upon painful stimulus. She dose not seem to have cough, nausea, vomiting or diarrhea.  In ED, patient was found to have WBC 19.7, CBG 146 on arrival, positive urinalysis with large amount of leukocytes, hypothermia with temperature 94.7, oxygen desaturation to 86%, slightly bradycardia, stable renal function. Chest x-ray is negative for acute abnormalities. CT head is negative for acute intracranial abnormalities. Patient is admitted to inpatient for further intervention treatment.  Assessment & Plan   Sepsis secondary to UTI -Patient has positive urinalysis with large amount of leukocyte, consistent with UTI.  -Upon admission, patient had leukocytosis, hypothermia and  encephalopathy.  -WBC normalized, VSS stable -Patient was on Tygacil and as well as fluconazole -Infectious disease recommended discontinuing antibiotics and starting her on Primaxin -main problem and barrier for discharge is delirium and inability for meaningful PO intake/medications -Spoke with Dr. Domingo Cocking, palliative care, unsure of patient's true baseline.  Not sure that patient will recover from this.  He agrees, plan for discussion with patient's daughter later today.  Acute encephalopathy -Most likely due to UTI and sepsis. -Treating UTI as above -Follow clinical response   Hx of stroke/TIA -continue ASA -recent admission for TIA -completed work up approx 3 weeks ago -discussed with neurology and will monitor for now -patient is back to baseline -will consider using plavix and low dose ASA when able to take PO  Hypertension -continue IV metoprolol with holding parameter  History of Afib/flutter:  -CHA2DS2-VASc Score is 6, needs oral anticoagulation, but patient is not on [facility] due to old age, dementia and high risk of fall.  -HR is well controled. -on IV metoprolol -ASA for prevention  Dysphagia -Speech recommending full liquid diet -very high risk for aspiration  Hypothyroidism -TSH WNL -Continue IV synthroid, given dysphagia  Diabetes Mellitus, type II -Last A1c 11.9 on 04/11/15, poorly controlled.  -Patient is taking Lantus  at home -Continue ISS -patient still not eating much and a high risk for hypoglycemia   S/P transmetatarsal amputation of right foot  -due to chronic osteomyelitis -will discontinue IV Tigecycline as recommended by ID -consult wound care for dressing instructions and care  Chronic kidney disease (CKD), stage IV (severe)  -Creatinine is 1.4 on admission. Currently 1.54 -Continue to monitor [facility]  Hospital acquired delirium -Continue seroquel, haldol  Code Status: DNR   Family Communication: None at bedside  Disposition Plan:  Admitted.  Plan for discussion with patient's daughter and palliative care later today.  Time Spent in minutes   30 minutes  Procedures  None  Consults   Neurology curbside (during acute event of TIA) Palliative Care ID (Dr. Linus Salmons; Will d/c tigicycline and fluconazole; complete 5 days total of primaxin.)  DVT Prophylaxis  Lovenox  Lab Results  Component Value Date   PLT 392 05/08/2015    Medications  Scheduled Meds: . aspirin  150 mg Rectal Daily  . enoxaparin (LOVENOX) injection  30 mg Subcutaneous Q24H  . haloperidol lactate  0.5 mg Intravenous BID  . heparin flush  50 Units Intravenous Q12H  . imipenem-cilastatin  250 mg Intravenous Q6H  . insulin aspart  0-9 Units Subcutaneous TID WC  . levothyroxine  75 mcg Intravenous QAC breakfast  . metoprolol  5 mg Intravenous 3 times per day  . saccharomyces boulardii  250 mg Oral BID  . sodium chloride  2,500 mL Intravenous Once  . sodium chloride flush  3 mL Intravenous Q12H   Continuous Infusions: . sodium chloride     PRN Meds:.acetaminophen **OR** acetaminophen, hydrALAZINE, ondansetron (ZOFRAN) IV, sodium chloride flush  Antibiotics    Anti-infectives    Start     Dose/Rate Route Frequency Ordered Stop   05/05/15 1800  imipenem-cilastatin (PRIMAXIN) 250 mg in sodium chloride 0.9 % 100 mL IVPB     250 mg 200 mL/hr over 30 Minutes Intravenous Every 6 hours 05/05/15 1731     05/04/15 2359  imipenem-cilastatin (PRIMAXIN) 250 mg in sodium chloride 0.9 % 100 mL IVPB  Status:  Discontinued     250 mg 200 mL/hr over 30 Minutes Intravenous Every 12 hours 05/03/15 1323 05/04/15 0334   05/04/15 2200  fluconazole (DIFLUCAN) IVPB 100 mg  Status:  Discontinued     100 mg 50 mL/hr over 60 Minutes Intravenous Every 24 hours 05/04/15 2131 05/05/15 1707   05/04/15 0400  imipenem-cilastatin (PRIMAXIN) 250 mg in sodium chloride 0.9 % 100 mL IVPB  Status:  Discontinued     250 mg 200 mL/hr over 30 Minutes Intravenous Every 6 hours  05/04/15 0334 05/04/15 2121   05/03/15 1200  imipenem-cilastatin (PRIMAXIN) 250 mg in sodium chloride 0.9 % 100 mL IVPB  Status:  Discontinued     250 mg 200 mL/hr over 30 Minutes Intravenous 4 times per day 05/03/15 0502 05/03/15 1323   05/03/15 1000  tigecycline (TYGACIL) injection 50 mg  Status:  Discontinued     50 mg Intravenous Every 12 hours 05/03/15 0450 05/03/15 0454   05/03/15 1000  tigecycline (TYGACIL) 50 mg in sodium chloride 0.9 % 100 mL IVPB  Status:  Discontinued     50 mg 200 mL/hr over 30 Minutes Intravenous Every 12 hours 05/03/15 0455 05/05/15 1707   05/03/15 0515  imipenem-cilastatin (PRIMAXIN) 250 mg in sodium chloride 0.9 % 100 mL IVPB     250 mg 200 mL/hr over 30 Minutes Intravenous STAT 05/03/15 0501 05/03/15 [id number]  05/03/15 0430  vancomycin (VANCOCIN) IVPB 1000 mg/200 mL premix  Status:  Discontinued     1,000 mg 200 mL/hr over 60 Minutes Intravenous  Once 05/03/15 0416 05/03/15 0450   05/03/15 0430  piperacillin-tazobactam (ZOSYN) IVPB 3.375 g  Status:  Discontinued     3.375 g 100 mL/hr over 30 Minutes Intravenous  Once 05/03/15 0416 05/03/15 0436     Subjective:   [name] seen and examined today.  Patient currently nonverbal, moves all extremities without purpose.  Does not respond to questions.   Objective:   Filed Vitals:   05/06/15 0520 05/06/15 2235 05/07/15 2101 05/08/15 0629  BP: 177/59 136/78 158/60 152/52  Pulse: 72 68 80   Temp: 97.9 F (36.6 C) 98.2 F (36.8 C) 98.6 F (37 C) 98.4 F (36.9 C)  TempSrc: Axillary Axillary Axillary Axillary  Resp: 18 18 18    Weight:      SpO2: 97% 98% 99% 96%    Wt Readings from Last 3 Encounters:  05/04/15 71.668 kg (158 lb)  04/30/15 74.844 kg (165 lb)  04/28/15 74.844 kg (165 lb)     Intake/Output Summary (Last 24 hours) at 05/08/15 1208 Last data filed at 05/08/15 0428  Gross per 24 hour  Intake     10 ml  Output      0 ml  Net     10 ml    Exam  General: Well developed, Elderly,  NAD  HEENT: NCAT, mucous membranes moist.   Cardiovascular: S1 S2 auscultated, regular  Respiratory: Clear to auscultation bilaterally   Abdomen: Soft, nontender, nondistended, + bowel sounds  Extremities: warm dry without cyanosis clubbing or edema. RLE wrapped.  Small soft mass right calf- no erythema, induration, or drainage.  Neuro: Unable to fully assess.  Moves all extremities   Data Review   Micro Results Recent Results (from the past 240 hour(s))  Urine culture     Status: Abnormal   Collection Time: 05/03/15  2:55 AM  Result Value Ref Range Status   Specimen Description URINE, CATHETERIZED  Final   Special Requests Normal  Final   Culture >=100,000 COLONIES/mL YEAST (A)  Final   Report Status 05/04/2015 FINAL  Final  Blood culture (routine x 2)     Status: None (Preliminary result)   Collection Time: 05/03/15  4:03 AM  Result Value Ref Range Status   Specimen Description BLOOD RIGHT HAND  Final   Special Requests BOTTLES DRAWN AEROBIC AND ANAEROBIC 5CC  Final   Culture   Final    NO GROWTH 4 DAYS Performed at [facility]    Report Status PENDING  Incomplete  Blood culture (routine x 2)     Status: None (Preliminary result)   Collection Time: 05/03/15  4:09 AM  Result Value Ref Range Status   Specimen Description BLOOD LEFT HAND  Final   Special Requests BOTTLES DRAWN AEROBIC AND ANAEROBIC 5CC  Final   Culture   Final    NO GROWTH 4 DAYS Performed at [facility]    Report Status PENDING  Incomplete  C difficile quick scan w PCR reflex     Status: None   Collection Time: 05/06/15  2:51 AM  Result Value Ref Range Status   C Diff antigen NEGATIVE NEGATIVE Final   C Diff toxin NEGATIVE NEGATIVE Final   C Diff interpretation Negative for toxigenic C. difficile  Final    Radiology Reports Dg Chest 2 View  05/03/2015  CLINICAL DATA:  Hypoglycemia.  Cough and congestion. EXAM: CHEST  2 VIEW COMPARISON:  04/10/2015 FINDINGS: There is a right upper  extremity PICC line extending into the low SVC. There is mild vascular and interstitial prominence which is unchanged. No consolidation. No effusions. Unchanged mild cardiomegaly. IMPRESSION: Unchanged mild cardiomegaly and vascular/interstitial prominence. No consolidation or effusion. Electronically Signed   By: Andreas Newport M.D.   On: 05/03/2015 01:51   Ct Head Wo Contrast  05/04/2015  CLINICAL DATA:  Sudden onset of altered mental status approximately 45 minutes ago. EXAM: CT HEAD WITHOUT CONTRAST TECHNIQUE: Contiguous axial images were obtained from the base of the skull through the vertex without intravenous contrast. COMPARISON:  05/03/2015 FINDINGS: The ventricles are normal in configuration. There is ventricular and sulcal enlargement reflecting moderate atrophy. No hydrocephalus. There are no parenchymal masses or mass effect. There is no evidence of a recent cortical infarct. Patchy white matter hypoattenuation is noted, stable from the prior exam, consistent with mild chronic microvascular ischemic change. There are no extra-axial masses or abnormal fluid collections. There is no intracranial hemorrhage. The visualized sinuses are clear. IMPRESSION: 1. No acute intracranial abnormalities. No evidence of a recent cortical infarct or of intracranial hemorrhage. 2. Atrophy and mild chronic microvascular ischemic change. These results were called by telephone at the time of interpretation on 05/04/2015 at 11:24 am to nurse Alease Medina, who verbally acknowledged these results. Electronically Signed   By: Lajean Manes M.D.   On: 05/04/2015 11:25   Ct Head Wo Contrast  05/03/2015  CLINICAL DATA:  Hypoglycemia.  Altered mental status. EXAM: CT HEAD WITHOUT CONTRAST TECHNIQUE: Contiguous axial images were obtained from the base of the skull through the vertex without intravenous contrast. COMPARISON:  04/10/2015 FINDINGS: There is no intracranial hemorrhage, mass or evidence of acute infarction. There is  no extra-axial fluid collection. There is moderate generalized atrophy. There is white matter hypodensity, chronic and likely due to small vessel disease. No acute intracranial findings are evident. No interval change is evident. No bony abnormalities evident. The visible paranasal sinuses are clear. IMPRESSION: Generalized atrophy and chronic white matter hypodensities, likely small vessel disease. No acute intracranial finding. Electronically Signed   By: Andreas Newport M.D.   On: 05/03/2015 01:52   Ct Head Wo Contrast  04/10/2015  CLINICAL DATA:  Slurred speech EXAM: CT HEAD WITHOUT CONTRAST TECHNIQUE: Contiguous axial images were obtained from the base of the skull through the vertex without intravenous contrast. COMPARISON:  08/27/2013 FINDINGS: The bony calvarium is intact. No gross soft tissue abnormality is noted. Diffuse atrophic changes are noted. Areas of chronic white matter ischemic change are seen. No findings to suggest acute hemorrhage, acute infarction or space-occupying mass lesion are noted. IMPRESSION: Chronic atrophic and ischemic changes.  No acute abnormality noted. These results were called by telephone at the time of interpretation on 04/10/2015 at 10:38 am to Dr. Silverio Decamp, who verbally acknowledged these results. Electronically Signed   By: Inez Catalina M.D.   On: 04/10/2015 10:38   Mr Virgel Paling Wo Contrast  04/10/2015  CLINICAL DATA:  82 year old diabetic hypertensive female with prior stroke presenting with near syncopal episode and slurred speech. Subsequent encounter. EXAM: MRI HEAD WITHOUT CONTRAST MRA HEAD WITHOUT CONTRAST TECHNIQUE: Multiplanar, multiecho pulse sequences of the brain and surrounding structures were obtained without intravenous contrast. Angiographic images of the head were obtained using MRA technique without contrast. COMPARISON:  04/10/2015 CT.  02/04/2012 MR and MR angiogram. FINDINGS: MRI HEAD FINDINGS No acute infarct or  intracranial hemorrhage. Moderate  global atrophy without hydrocephalus. Mild to moderate chronic small vessel disease changes. No intracranial mass lesion noted on this unenhanced exam. Partial opacification left mastoid air cells without obstructing lesion of eustachian tube. Post lens replacement otherwise orbital structures unremarkable. Cervical spondylotic changes with spinal stenosis and cord flattening most notable C2-3 followed by the C3-4 and C4-5 level. MRA HEAD FINDINGS Anterior circulation without medium or large size vessel significant stenosis or occlusion. Moderate moderate narrowing of portions of the middle cerebral artery branches and A2 segment anterior cerebral artery bilaterally. No high-grade stenosis distal vertebral arteries or basilar artery. Moderate posterior circulation branch vessel narrowing and irregularity including high-grade stenosis proximal and distal aspect of the posterior cerebral arteries more notable on the right. No aneurysm noted. IMPRESSION: MRI HEAD No acute infarct or intracranial hemorrhage. Moderate global atrophy without hydrocephalus. Mild to moderate chronic small vessel disease changes. No intracranial mass lesion noted on this unenhanced exam. Partial opacification left mastoid air cells. Cervical spondylotic changes with spinal stenosis and cord flattening most notable C2-3 followed by the C3-4 and C4-5 level. MRA HEAD Anterior circulation without medium or large size vessel significant stenosis or occlusion. Moderate moderate narrowing of portions of the middle cerebral artery branches and A2 segment anterior cerebral artery bilaterally. No high-grade stenosis distal vertebral arteries or basilar artery. Moderate posterior circulation branch vessel narrowing and irregularity. High-grade stenosis proximal and distal aspect of the posterior cerebral arteries more notable on the right. Electronically Signed   By: Genia Del M.D.   On: 04/10/2015 14:52   Mr Brain Wo Contrast  04/10/2015   CLINICAL DATA:  82 year old diabetic hypertensive female with prior stroke presenting with near syncopal episode and slurred speech. Subsequent encounter. EXAM: MRI HEAD WITHOUT CONTRAST MRA HEAD WITHOUT CONTRAST TECHNIQUE: Multiplanar, multiecho pulse sequences of the brain and surrounding structures were obtained without intravenous contrast. Angiographic images of the head were obtained using MRA technique without contrast. COMPARISON:  04/10/2015 CT.  02/04/2012 MR and MR angiogram. FINDINGS: MRI HEAD FINDINGS No acute infarct or intracranial hemorrhage. Moderate global atrophy without hydrocephalus. Mild to moderate chronic small vessel disease changes. No intracranial mass lesion noted on this unenhanced exam. Partial opacification left mastoid air cells without obstructing lesion of eustachian tube. Post lens replacement otherwise orbital structures unremarkable. Cervical spondylotic changes with spinal stenosis and cord flattening most notable C2-3 followed by the C3-4 and C4-5 level. MRA HEAD FINDINGS Anterior circulation without medium or large size vessel significant stenosis or occlusion. Moderate moderate narrowing of portions of the middle cerebral artery branches and A2 segment anterior cerebral artery bilaterally. No high-grade stenosis distal vertebral arteries or basilar artery. Moderate posterior circulation branch vessel narrowing and irregularity including high-grade stenosis proximal and distal aspect of the posterior cerebral arteries more notable on the right. No aneurysm noted. IMPRESSION: MRI HEAD No acute infarct or intracranial hemorrhage. Moderate global atrophy without hydrocephalus. Mild to moderate chronic small vessel disease changes. No intracranial mass lesion noted on this unenhanced exam. Partial opacification left mastoid air cells. Cervical spondylotic changes with spinal stenosis and cord flattening most notable C2-3 followed by the C3-4 and C4-5 level. MRA HEAD Anterior  circulation without medium or large size vessel significant stenosis or occlusion. Moderate moderate narrowing of portions of the middle cerebral artery branches and A2 segment anterior cerebral artery bilaterally. No high-grade stenosis distal vertebral arteries or basilar artery. Moderate posterior circulation branch vessel narrowing and irregularity. High-grade stenosis proximal and distal aspect of the posterior  cerebral arteries more notable on the right. Electronically Signed   By: Genia Del M.D.   On: 04/10/2015 14:52   Ir Fluoro Guide Cv Line Right  04/29/2015  INDICATION: 82 year old female with a history of foot infection and requirement of antibiotics. EXAM: PICC LINE PLACEMENT WITH ULTRASOUND AND FLUOROSCOPIC GUIDANCE MEDICATIONS: None ANESTHESIA/SEDATION: None FLUOROSCOPY TIME:  Fluoroscopy Time: 0 minutes 12 seconds (1 mGy). COMPLICATIONS: None PROCEDURE: The patient was advised of the possible risks and complications and agreed to undergo the procedure. The patient was then brought to the angiographic suite for the procedure. The right arm was prepped with chlorhexidine, draped in the usual sterile fashion using maximum barrier technique (cap and mask, sterile gown, sterile gloves, large sterile sheet, hand hygiene and cutaneous antisepsis) and infiltrated locally with 1% Lidocaine. Ultrasound demonstrated patency of the right basilic vein, and this was documented with an image. Under real-time ultrasound guidance, this vein was accessed with a 21 gauge micropuncture needle and image documentation was performed. A 0.018 wire was introduced in to the vein. Over this, a 5 [place] single lumen power injectable PICC was advanced to the superior SVC/right atrial junction. Fluoroscopy during the procedure and fluoro spot radiograph confirms appropriate catheter position. The catheter was flushed and covered with asterile dressing. Catheter length: 40 cm IMPRESSION: Status post right basilic vein PICC  placement. 40 cm single-lumen power injectable PICC placed. Catheter ready for use. Signed, Dulcy Fanny. Earleen Newport, DO [facility] Electronically Signed   By: Corrie Mckusick D.O.   On: 04/29/2015 15:05   Ir US Guide Vasc Access Right  04/29/2015  INDICATION: 82 year old female with a history of foot infection and requirement of antibiotics. EXAM: PICC LINE PLACEMENT WITH ULTRASOUND AND FLUOROSCOPIC GUIDANCE MEDICATIONS: None ANESTHESIA/SEDATION: None FLUOROSCOPY TIME:  Fluoroscopy Time: 0 minutes 12 seconds (1 mGy). COMPLICATIONS: None PROCEDURE: The patient was advised of the possible risks and complications and agreed to undergo the procedure. The patient was then brought to the angiographic suite for the procedure. The right arm was prepped with chlorhexidine, draped in the usual sterile fashion using maximum barrier technique (cap and mask, sterile gown, sterile gloves, large sterile sheet, hand hygiene and cutaneous antisepsis) and infiltrated locally with 1% Lidocaine. Ultrasound demonstrated patency of the right basilic vein, and this was documented with an image. Under real-time ultrasound guidance, this vein was accessed with a 21 gauge micropuncture needle and image documentation was performed. A 0.018 wire was introduced in to the vein. Over this, a 5 [place] single lumen power injectable PICC was advanced to the superior SVC/right atrial junction. Fluoroscopy during the procedure and fluoro spot radiograph confirms appropriate catheter position. The catheter was flushed and covered with asterile dressing. Catheter length: 40 cm IMPRESSION: Status post right basilic vein PICC placement. 40 cm single-lumen power injectable PICC placed. Catheter ready for use. Signed, Dulcy Fanny. Earleen Newport, DO [facility] Electronically Signed   By: Corrie Mckusick D.O.   On: 04/29/2015 15:05   Dg Chest Port 1  View  04/10/2015  CLINICAL DATA:  Chronic nonproductive cough EXAM: PORTABLE CHEST 1 VIEW COMPARISON:  October 24, 2014 FINDINGS: There is no edema or consolidation. The heart size and pulmonary vascularity are normal. No adenopathy. No bone lesions. IMPRESSION: No edema or consolidation. Electronically Signed   By: Lowella Grip III M.D.   On: 04/10/2015 10:52    CBC  Recent Labs Lab 05/03/15 0139 05/03/15 0147 05/03/15 0553 05/05/15  0422 05/08/15 0420  WBC 19.7*  --  13.1* 9.7 14.0*  HGB 10.6* 11.2* 9.5* 10.6* 11.3*  HCT 32.2* 33.0* 29.3* 32.2* 35.1*  PLT 340  --  331 356 392  MCV 84.1  --  83.0 81.3 83.4  MCH 27.7  --  26.9 26.8 26.8  MCHC 32.9  --  32.4 32.9 32.2  RDW 15.1  --  15.1 14.6 15.3  LYMPHSABS 1.8  --   --   --   --   MONOABS 1.1*  --   --   --   --   EOSABS 0.3  --   --   --   --   BASOSABS 0.1  --   --   --   --     Chemistries   Recent Labs Lab 05/03/15 0147 05/03/15 0553 05/05/15 0422 05/08/15 0420  NA 138 136 137 147*  K 4.9 5.0 4.5 4.4  CL 104 107 108 114*  CO2  --  21* 23 21*  GLUCOSE 221* 174* 212* 278*  BUN 45* 51* 32* 35*  CREATININE 1.40* 1.37* 1.21* 1.54*  CALCIUM  --  8.1* 8.3* 8.7*  AST  --  12*  --   --   ALT  --  12*  --   --   ALKPHOS  --  67  --   --   BILITOT  --  0.3  --   --    ------------------------------------------------------------------------------------------------------------------ estimated creatinine clearance is 23 mL/min (by C-G formula based on Cr of 1.54). ------------------------------------------------------------------------------------------------------------------ No results for input(s): HGBA1C in the last 72 hours. ------------------------------------------------------------------------------------------------------------------ No results for input(s): CHOL, HDL, LDLCALC, TRIG, CHOLHDL, LDLDIRECT in the last 72  hours. ------------------------------------------------------------------------------------------------------------------ No results for input(s): TSH, T4TOTAL, T3FREE, THYROIDAB in the last 72 hours.  Invalid input(s): FREET3 ------------------------------------------------------------------------------------------------------------------ No results for input(s): VITAMINB12, FOLATE, FERRITIN, TIBC, IRON, RETICCTPCT in the last 72 hours.  Coagulation profile  Recent Labs Lab 05/03/15 0553  INR 1.34    No results for input(s): DDIMER in the last 72 hours.  Cardiac Enzymes No results for input(s): CKMB, TROPONINI, MYOGLOBIN in the last 168 hours.  Invalid input(s): CK ------------------------------------------------------------------------------------------------------------------ Invalid input(s): POCBNP    Hilary Pundt D.O. on 05/08/2015 at 12:08 PM  Between 7am to 7pm - Pager - [phone]  After 7pm go to [email or web address] - password [email or web address]  And look for the night coverage person covering for me after hours  [facility] Office  [phone]

## 2015-05-08 NOTE — Progress Notes (Signed)
Date:  May 08, 2015 Chart reviewed for concurrent status and case management needs. Will continue to follow patient for changes and needs: Velva Harman, BSN, [place], [place]   [phone]

## 2015-05-08 NOTE — Progress Notes (Signed)
Patient ID: [name], female   DOB: 09/01/1919, 82 y.o.   MRN: [id number] Ms. Felter is very well-known to me.  She had a right foot transmetatarsal amputation earlier this year.  She was admitted from [facility] due to mental status changes and was also noted to have a mass on the back for her right calf that I was asked to come by and see.  On exam, there is a 3-5 cm mass that is soft on the back of her right calf/ankle.  There is no fluctuance and no open wounds and it is mobile.  For now, with her skin intact, I wound recommend just observation.

## 2015-05-08 NOTE — Progress Notes (Signed)
Daily Progress Note   Patient Name: [name]       Date: 05/08/2015 DOB: 12-09-1919  Age: 82 y.o. MRN#: [id number] Attending Physician: Cristal Ford, DO Primary Care Physician: Jani Gravel, MD Admit Date: 05/03/2015  Reason for Consultation/Follow-up: Establishing goals of care  Subjective: I met today with [name] and her daughter, [name].  Patient still not able to meaningfully carry on conversation, but she was noted to say a few more words today and did smile/laugh when watching video of her cats on her daughter's phone.  I discussed with her daughter that, while she is more interactive today, her mother's overall nutrition, functional status, and cognition continues to decline. We talked about pathways forward including residential hospice.  Length of Stay: 5 days  Current Medications: Scheduled Meds:  . aspirin  150 mg Rectal Daily  . enoxaparin (LOVENOX) injection  30 mg Subcutaneous Q24H  . haloperidol lactate  0.5 mg Intravenous BID  . heparin flush  50 Units Intravenous Q12H  . imipenem-cilastatin  250 mg Intravenous Q6H  . insulin aspart  0-9 Units Subcutaneous TID WC  . levothyroxine  75 mcg Intravenous QAC breakfast  . metoprolol  5 mg Intravenous 3 times per day  . saccharomyces boulardii  250 mg Oral BID  . sodium chloride  2,500 mL Intravenous Once  . sodium chloride flush  3 mL Intravenous Q12H    Continuous Infusions: . sodium chloride 75 mL/hr at 05/08/15 1224    PRN Meds: acetaminophen **OR** acetaminophen, hydrALAZINE, ondansetron (ZOFRAN) IV, sodium chloride flush  Physical Exam: Physical Exam             Weak, elderly lady, resting in bed S1 S2 Shallow clear anteriorly Abdomen soft No edema, s/p tm amputn Opens eyes, Only repeats "no"  repeatedly today.   Vital Signs: BP 177/56 mmHg  Pulse 73  Temp(Src) 98.1 F (36.7 C) (Oral)  Resp 18  Wt 71.668 kg (158 lb)  SpO2 96% SpO2: SpO2: 96 % O2 Device: O2 Device: Not Delivered O2 Flow Rate:    Intake/output summary:   Intake/Output Summary (Last 24 hours) at 05/08/15 2150 Last data filed at 05/08/15 0428  Gross per 24 hour  Intake     10 ml  Output      0 ml  Net  10 ml   LBM: Last BM Date: 05/07/15 Baseline Weight: Weight: 29.7 kg (65 lb 7.6 oz) (See Progress Note.) Most recent weight: Weight: 71.668 kg (158 lb)       Palliative Assessment/Data: Flowsheet Rows        Most Recent Value   Intake Tab    Referral Department  Hospitalist   Unit at Time of Referral  Med/Surg Unit   Palliative Care Primary Diagnosis  Neurology   Date Notified  05/03/15   Palliative Care Type  Return patient Palliative Care   Reason for referral  Clarify Goals of Care   Date of Admission  05/03/15   Date first seen by Palliative Care  05/04/15   # of days Palliative referral response time  1 Day(s)   # of days IP prior to Palliative referral  0   Clinical Assessment    Palliative Performance Scale Score  30%   Pain Max last 24 hours  3   Pain Min Last 24 hours  2   Dyspnea Max Last 24 Hours  2   Dyspnea Min Last 24 hours  1   Psychosocial & Spiritual Assessment    Palliative Care Outcomes    Patient/Family meeting held?  No   Who was at the meeting?  patient, daughter over the phone.    Palliative Care follow-up planned  Yes, Facility   Other Treatment Preference Instructions  needs palliative care at SNF rehab on D/C       Additional Data Reviewed: CBC    Component Value Date/Time   WBC 14.0* 05/08/2015 0420   WBC 9.5 04/29/2015   RBC 4.21 05/08/2015 0420   RBC 3.45* 06/07/2008 1040   HGB 11.3* 05/08/2015 0420   HCT 35.1* 05/08/2015 0420   PLT 392 05/08/2015 0420   MCV 83.4 05/08/2015 0420   MCH 26.8 05/08/2015 0420   MCHC 32.2 05/08/2015 0420   RDW 15.3  05/08/2015 0420   LYMPHSABS 1.8 05/03/2015 0139   MONOABS 1.1* 05/03/2015 0139   EOSABS 0.3 05/03/2015 0139   BASOSABS 0.1 05/03/2015 0139    CMP     Component Value Date/Time   NA 147* 05/08/2015 0420   NA 141 04/29/2015   K 4.4 05/08/2015 0420   CL 114* 05/08/2015 0420   CO2 21* 05/08/2015 0420   GLUCOSE 278* 05/08/2015 0420   BUN 35* 05/08/2015 0420   BUN 45* 04/29/2015   CREATININE 1.54* 05/08/2015 0420   CREATININE 1.8* 04/29/2015   CALCIUM 8.7* 05/08/2015 0420   PROT 6.1* 05/03/2015 0553   ALBUMIN 2.8* 05/03/2015 0553   AST 12* 05/03/2015 0553   ALT 12* 05/03/2015 0553   ALKPHOS 67 05/03/2015 0553   BILITOT 0.3 05/03/2015 0553   GFRNONAA 28* 05/08/2015 0420   GFRAA 32* 05/08/2015 0420       Problem List:  Patient Active Problem List   Diagnosis Date Noted  . Palliative care encounter   . Acute delirium   . Diarrhea   . Encounter for palliative care   . Goals of care, counseling/discussion   . Urinary tract infection, site not specified   . Chronic atrial fibrillation ([place])   . UTI (lower urinary tract infection) 05/03/2015  . Sepsis ([place]) 05/03/2015  . Acute encephalopathy 05/03/2015  . Hypothermia 05/03/2015  . Hypoglycemia   . TIA (transient ischemic attack)   . Type 2 diabetes mellitus with circulatory disorder ([place])   . Dementia   . CVA (cerebral infarction) 04/10/2015  . S/P  transmetatarsal amputation of right foot ([place]) 04/10/2015  . History of CVA w/ right side deficits 04/05/2015  . Chronic osteomyelitis ([place]) 04/05/2015  . Diabetes mellitus with renal manifestations, uncontrolled ([place]) 10/21/2014  . Chronic ulcer of left foot ([place]) 10/21/2014  . Chronic diastolic CHF (congestive heart failure) ([place]) 10/21/2014  . Diabetic neuropathy ([place]) 08/09/2014  . Coronary atherosclerosis of native coronary artery 06/07/2012  . Chronic kidney disease (CKD), stage IV (severe) ([place]) 04/12/2012  . Hypothyroidism 04/21/2011  . HYPERTENSION, BENIGN  ESSENTIAL 12/08/2006     Palliative Care Assessment & Plan    1.Code Status:  DNR    Code Status Orders        Start     Ordered   05/04/15 1441  Do not attempt resuscitation (DNR)   Continuous    Question Answer Comment  In the event of cardiac or respiratory ARREST Do not call a "code blue"   In the event of cardiac or respiratory ARREST Do not perform Intubation, CPR, defibrillation or ACLS   In the event of cardiac or respiratory ARREST Use medication by any route, position, wound care, and other measures to relive pain and suffering. May use oxygen, suction and manual treatment of airway obstruction as needed for comfort.      05/04/15 1440    Code Status History    Date Active Date Inactive Code Status Order ID Comments User Context   05/03/2015  4:53 AM 05/04/2015  2:40 PM Full Code [id number]  Ivor Costa, MD ED   04/10/2015  6:53 PM 04/12/2015 11:07 PM Full Code [id number]  Samella Parr, NP Inpatient   04/04/2015  9:56 PM 04/08/2015  6:23 PM Full Code [id number]  Norval Morton, MD ED   10/22/2014 11:27 AM 10/25/2014  8:06 PM DNR [id number]  Thurnell Lose, MD Inpatient   10/21/2014  5:31 PM 10/22/2014 11:27 AM Full Code [id number]  Robbie Lis, MD Inpatient   08/08/2014  5:20 PM 08/13/2014  6:08 PM Full Code [id number]  Theodis Blaze, MD Inpatient   11/18/2013 12:57 PM 11/21/2013  4:33 PM Full Code [id number]  Barton Dubois, MD Inpatient   08/28/2013 12:55 AM 08/30/2013  7:16 PM Full Code [id number]  Hosie Poisson, MD Inpatient   04/14/2012  7:40 PM 04/17/2012  5:41 PM Full Code [id number]  Newt Minion, MD Inpatient   04/12/2012 12:18 AM 04/14/2012  7:40 PM Full Code [id number]  Janell Quiet, MD Inpatient   04/21/2011  9:40 PM 04/27/2011  7:19 PM Full Code [id number]  Janae Bridgeman, RN Inpatient       2. Goals of Care/Additional Recommendations:  Limitations on Scope of Treatment: DNR DNI, but no other limits on care, full scope of medical treatment such as fluids, antibiotics.   I'm  concerned she has developed hypoactive delirium. Continue delirium protocol. Continue BID scheduled haldol.  I had a long discussion with her daughter today regarding loss of nutrition, functional status and cognition.  She appears a little more awake at time of our encounter, however, I believe that this will have little impact on her overall trajectory.  We discussed options forward if we are, in fact, approaching the end of life for her mother.  If care plan were to focus on comfort, I believe that her prognosis would be < 2 weeks based upon lack of nutrition and hydration.  We discussed residential hospice as option if this is the case.  Her daughter reports understanding of the severity of  her condition, however, with her mother eing more awake today, she would like to see how she does overnight.    Plan to meet at 10:00am tomorrow.  If no improvement, will likely pursue residential hospice.   3. Symptom Management:      1. As above   4. Palliative Prophylaxis:   Delirium Protocol  Standard delirium management (adapted from NICE guidelines 2011 for prevention of delirium):  Provide continuity of care when possible (avoid frequent changing of surroundings and staff).  Frequent reorientation to time with:  A clock should always be visible.  Make sure Calendar/white board is updated.  Lights on/blinds open during the day and off/closed at night.  Encourage frequent family visits.  Monitor for and treat dehydration/constipation.  Optimize oxygen saturation.  Avoid catheters and IV's when possible and look for/treat infections.  Encourage early mobility.  Assess and treat pain.  Ensure adequate nutrition and functioning dentures.  Address reversible causes of hearing and visual impairment:  Use pocket talker if hearing aids are unavailable.  Avoid sleep disturbance (normalize sleep/wake cycle).  Minimize disturbances and consider NOT obtaining vitals at night if possible.  Review  Medications to avoid polypharmacy and avoid deliriogenic medications when possible:  Benzodiazepines.  Dihydropyridines.  Antihistamines.  Anticholinergics.  (Possibly avoid: H2 blockers, tricyclic antidepressants, antiparkinson medications, steroids, NSAID's).   5. Prognosis: Unable to determine but appears guarded given advanced age and multiple acute and underlying co morbidities including compromised nutrition. I'm concerned this is irreversible decline.   6. Discharge Planning:  [facility] for rehab with Palliative care service follow-up vs residential hospice   Care plan was discussed with  Patient, her daughter, and Dr. Ree Kida   Thank you for allowing the Palliative Medicine Team to assist in the care of this patient.   Time In:  1450 Time Out: 1540 Total Time 50 Prolonged Time Billed  no        [phone] Micheline Rough, MD  05/08/2015, 9:50 PM  Please contact Palliative Medicine Team phone at [phone] for questions and concerns.

## 2015-05-08 NOTE — Progress Notes (Signed)
Nutrition Follow-up  DOCUMENTATION CODES:   Not applicable  INTERVENTION:  - Continue to monitor POC/GOC - Continue to monitor for SLP recommendations - Continue CLD and advance as medically feasible/GOC and SLP recommendations driven  NUTRITION DIAGNOSIS:   Swallowing difficulty related to dysphagia, lethargy/confusion, acute illness as evidenced by other (see comment) (SLP report.). -ongoing  GOAL:   Patient will meet greater than or equal to 90% of their needs -unmet  MONITOR:   PO intake, Weight trends, Labs, Skin, I & O's  ASSESSMENT:   82 y.o. female with PMH of hypothyroidism, diastolic congestive heart failure, atrial flutter not on anticoagulants due to old age, dementia and high risk of fall, stroke, chronic kidney disease-stage IV, BPH, recent right transmetatarsal amputation due to chronic osteomyelitis, who presents with altered mental status and hypoglycemia.  4/13 Pt continues on CLD since previous assessment with no intakes documented. Pt sleeping at this time with untouched breakfast tray on bedside table and no family/visitors present. Palliative continues to follow for POC/GOC and plan, per note yesterday, to meet with daughter today concerning the same.   SLP note 4/11 @ 1846: Full liquids- small amounts for comfort Allow time for pt to conduct multiple swallows Have pt assist to self feed as able  Diet has not been changed at this time; will continue to monitor for plans related to this and overall POC/GOC. Not meeting needs. Medications reviewed. IVF: NS @ 20 mL/hr. Labs reviewed; CBGs: 133-309 mg/dL, Cl: 114 mmol/L, Na: 147 mmol/L, BUN/creatinine elevated and trending up, Ca: 8.7 mg/dL, GFR: 28.    4/11 - Per chart review, pt consumed 75% of breakfast on Heart Healthy diet 05/03/15. - Pt with hx of dementia currently exacerbated by acute encephalopathy and is unable to provide any information at time of RD visit; no family/visitors present.  - Spoke with  SLP in the room who states that she provided pt with a few bites of ice cream and a few sips of water and that pt likes ice cream.  - She states that pt coughing with all intakes and that nursing staff has reported pt frequently "bringing a lot of stuff up."  - SLP has provided pt with emesis basin. - SLP reports pt is well known to her and that current presentation is a big decline for this pt.  - Palliative team following pt and last note was 4/10 @ 0857.  - Per RN note 4/9 @ 1240: Notified physician that patient gagging and vomiting after eating three bites of food. I reported I will give Zofran for nausea and vomiting. Physician orders Speech Therapy evaluation and Clear Liquid diet for patient as well as continue to control nausea. - Physical assessment shows no signs of muscle or fat wasting.  - Per chart review, weight fluctuations of 158-165 lbs over the past 1 month; will continue to monitor weight trends during admission. - Pt does not currently meet criteria for malnutrition but will update accordingly should this change.  - TF recommendations outlined above should pt be unsafe for PO diet and if TF is within POC/GOC: NGT with Glucerna 1.2 @ 45 mL/hr to provide 1296 kcal, 65 grams of protein, and 869 mL free water.   Diet Order:  Diet clear liquid Room service appropriate?: Yes; Fluid consistency:: Thin  Skin:  Wound (see comment) (R foot wound)  Last BM:  4/12  Height:   Ht Readings from Last 1 Encounters:  04/30/15 5\' 8"  (1.727 m)    Weight:  Wt Readings from Last 1 Encounters:  05/04/15 158 lb (71.668 kg)    Ideal Body Weight:  63.64 kg (kg)  BMI:  Body mass index is 24.03 kg/(m^2).  Estimated Nutritional Needs:   Kcal:  1300-1500  Protein:  55-65 grams  Fluid:  >/= 1.5 L/day  EDUCATION NEEDS:   No education needs identified at this time    Jarome Matin, RD, LDN Inpatient Clinical Dietitian Pager # [phone] After hours/weekend pager #  [phone]

## 2015-05-09 DIAGNOSIS — E1340 Other specified diabetes mellitus with diabetic neuropathy, unspecified: Secondary | ICD-10-CM

## 2015-05-09 LAB — GLUCOSE, CAPILLARY
GLUCOSE-CAPILLARY: 283 mg/dL — AB (ref 65–99)
GLUCOSE-CAPILLARY: 300 mg/dL — AB (ref 65–99)
Glucose-Capillary: 206 mg/dL — ABNORMAL HIGH (ref 65–99)
Glucose-Capillary: 170 mg/dL — ABNORMAL HIGH (ref 65–99)

## 2015-05-09 MED ORDER — INSULIN GLARGINE 100 UNIT/ML ~~LOC~~ SOLN
9.0000 [IU] | Freq: Every day | SUBCUTANEOUS | Status: DC
  Administered 2015-05-09 – 2015-05-12 (×4): 9 [IU] via SUBCUTANEOUS
  Filled 2015-05-09 (×4): qty 0.09

## 2015-05-09 NOTE — Progress Notes (Signed)
Daily Progress Note   Patient Name: [name]       Date: 05/09/2015 DOB: 1919/12/12  Age: 82 y.o. MRN#: [id number] Attending Physician: Cristal Ford, DO Primary Care Physician: Jani Gravel, MD Admit Date: 05/03/2015  Reason for Consultation/Follow-up: Establishing goals of care  Subjective: I met today with [name] and her daughter, [name].  [name] remains confused, but she is much more interactive and is able to participate in some basic conversation today. She denies any concerns or complaints.  I discussed with her daughter that, while she is even more interactive today than yesterday, her mother's overall nutrition, functional status, and cognition is poor and this may be a small improvement before continued decline. Her daughter appears to appreciate severity of the situation, but she is also hopeful that her mother will improve enough to return to [facility] where she had been living.  Length of Stay: 6 days  Current Medications: Scheduled Meds:  . aspirin  150 mg Rectal Daily  . enoxaparin (LOVENOX) injection  30 mg Subcutaneous Q24H  . haloperidol lactate  0.5 mg Intravenous BID  . heparin flush  50 Units Intravenous Q12H  . imipenem-cilastatin  250 mg Intravenous Q6H  . insulin aspart  0-9 Units Subcutaneous TID WC  . levothyroxine  75 mcg Intravenous QAC breakfast  . metoprolol  5 mg Intravenous 3 times per day  . saccharomyces boulardii  250 mg Oral BID  . sodium chloride  2,500 mL Intravenous Once  . sodium chloride flush  3 mL Intravenous Q12H    Continuous Infusions: . sodium chloride 75 mL/hr at 05/09/15 0349    PRN Meds: acetaminophen **OR** acetaminophen, hydrALAZINE, ondansetron (ZOFRAN) IV, sodium chloride flush  Physical Exam: Physical Exam              Weak, elderly lady, resting in bed S1 S2 Shallow clear anteriorly Abdomen soft No edema, s/p tm amputn Opens eyes, Only repeats "no" repeatedly today.   Vital Signs: BP 181/75 mmHg  Pulse 83  Temp(Src) 98.6 F (37 C) (Axillary)  Resp 16  Wt 71.668 kg (158 lb)  SpO2 98% SpO2: SpO2: 98 % O2 Device: O2 Device: Not Delivered O2 Flow Rate:    Intake/output summary:  No intake or output data in the 24 hours ending 05/09/15 1108 LBM:  Last BM Date: 05/07/15 Baseline Weight: Weight: 29.7 kg (65 lb 7.6 oz) (See Progress Note.) Most recent weight: Weight: 71.668 kg (158 lb)       Palliative Assessment/Data: Flowsheet Rows        Most Recent Value   Intake Tab    Referral Department  Hospitalist   Unit at Time of Referral  Med/Surg Unit   Palliative Care Primary Diagnosis  Neurology   Date Notified  05/03/15   Palliative Care Type  Return patient Palliative Care   Reason for referral  Clarify Goals of Care   Date of Admission  05/03/15   Date first seen by Palliative Care  05/04/15   # of days Palliative referral response time  1 Day(s)   # of days IP prior to Palliative referral  0   Clinical Assessment    Palliative Performance Scale Score  30%   Pain Max last 24 hours  3   Pain Min Last 24 hours  2   Dyspnea Max Last 24 Hours  2   Dyspnea Min Last 24 hours  1   Psychosocial & Spiritual Assessment    Palliative Care Outcomes    Patient/Family meeting held?  No   Who was at the meeting?  patient, daughter over the phone.    Palliative Care follow-up planned  Yes, Facility   Other Treatment Preference Instructions  needs palliative care at SNF rehab on D/C       Additional Data Reviewed: CBC    Component Value Date/Time   WBC 14.0* 05/08/2015 0420   WBC 9.5 04/29/2015   RBC 4.21 05/08/2015 0420   RBC 3.45* 06/07/2008 1040   HGB 11.3* 05/08/2015 0420   HCT 35.1* 05/08/2015 0420   PLT 392 05/08/2015 0420   MCV 83.4 05/08/2015 0420   MCH 26.8 05/08/2015  0420   MCHC 32.2 05/08/2015 0420   RDW 15.3 05/08/2015 0420   LYMPHSABS 1.8 05/03/2015 0139   MONOABS 1.1* 05/03/2015 0139   EOSABS 0.3 05/03/2015 0139   BASOSABS 0.1 05/03/2015 0139    CMP     Component Value Date/Time   NA 147* 05/08/2015 0420   NA 141 04/29/2015   K 4.4 05/08/2015 0420   CL 114* 05/08/2015 0420   CO2 21* 05/08/2015 0420   GLUCOSE 278* 05/08/2015 0420   BUN 35* 05/08/2015 0420   BUN 45* 04/29/2015   CREATININE 1.54* 05/08/2015 0420   CREATININE 1.8* 04/29/2015   CALCIUM 8.7* 05/08/2015 0420   PROT 6.1* 05/03/2015 0553   ALBUMIN 2.8* 05/03/2015 0553   AST 12* 05/03/2015 0553   ALT 12* 05/03/2015 0553   ALKPHOS 67 05/03/2015 0553   BILITOT 0.3 05/03/2015 0553   GFRNONAA 28* 05/08/2015 0420   GFRAA 32* 05/08/2015 0420       Problem List:  Patient Active Problem List   Diagnosis Date Noted  . Palliative care encounter   . Acute delirium   . Diarrhea   . Encounter for palliative care   . Goals of care, counseling/discussion   . Urinary tract infection, site not specified   . Chronic atrial fibrillation ([place])   . UTI (lower urinary tract infection) 05/03/2015  . Sepsis ([place]) 05/03/2015  . Acute encephalopathy 05/03/2015  . Hypothermia 05/03/2015  . Hypoglycemia   . TIA (transient ischemic attack)   . Type 2 diabetes mellitus with circulatory disorder ([place])   . Dementia   . CVA (cerebral infarction) 04/10/2015  . S/P transmetatarsal amputation of right foot (  [place]) 04/10/2015  . History of CVA w/ right side deficits 04/05/2015  . Chronic osteomyelitis ([place]) 04/05/2015  . Diabetes mellitus with renal manifestations, uncontrolled ([place]) 10/21/2014  . Chronic ulcer of left foot ([place]) 10/21/2014  . Chronic diastolic CHF (congestive heart failure) ([place]) 10/21/2014  . Diabetic neuropathy ([place]) 08/09/2014  . Coronary atherosclerosis of native coronary artery 06/07/2012  . Chronic kidney disease (CKD), stage IV (severe) ([place]) 04/12/2012  .  Hypothyroidism 04/21/2011  . HYPERTENSION, BENIGN ESSENTIAL 12/08/2006     Palliative Care Assessment & Plan    1.Code Status:  DNR    Code Status Orders        Start     Ordered   05/04/15 1441  Do not attempt resuscitation (DNR)   Continuous    Question Answer Comment  In the event of cardiac or respiratory ARREST Do not call a "code blue"   In the event of cardiac or respiratory ARREST Do not perform Intubation, CPR, defibrillation or ACLS   In the event of cardiac or respiratory ARREST Use medication by any route, position, wound care, and other measures to relive pain and suffering. May use oxygen, suction and manual treatment of airway obstruction as needed for comfort.      05/04/15 1440    Code Status History    Date Active Date Inactive Code Status Order ID Comments User Context   05/03/2015  4:53 AM 05/04/2015  2:40 PM Full Code [id number]  Ivor Costa, MD ED   04/10/2015  6:53 PM 04/12/2015 11:07 PM Full Code [id number]  Samella Parr, NP Inpatient   04/04/2015  9:56 PM 04/08/2015  6:23 PM Full Code [id number]  Norval Morton, MD ED   10/22/2014 11:27 AM 10/25/2014  8:06 PM DNR [id number]  Thurnell Lose, MD Inpatient   10/21/2014  5:31 PM 10/22/2014 11:27 AM Full Code [id number]  Robbie Lis, MD Inpatient   08/08/2014  5:20 PM 08/13/2014  6:08 PM Full Code [id number]  Theodis Blaze, MD Inpatient   11/18/2013 12:57 PM 11/21/2013  4:33 PM Full Code [id number]  Barton Dubois, MD Inpatient   08/28/2013 12:55 AM 08/30/2013  7:16 PM Full Code [id number]  Hosie Poisson, MD Inpatient   04/14/2012  7:40 PM 04/17/2012  5:41 PM Full Code [id number]  Newt Minion, MD Inpatient   04/12/2012 12:18 AM 04/14/2012  7:40 PM Full Code [id number]  Janell Quiet, MD Inpatient   04/21/2011  9:40 PM 04/27/2011  7:19 PM Full Code [id number]  Janae Bridgeman, RN Inpatient       2. Goals of Care/Additional Recommendations:  Limitations on Scope of Treatment: DNR DNI, but no other limits on care, full scope of medical  treatment such as fluids, antibiotics.   I'm concerned she has developed hypoactive delirium. Continue delirium protocol. Continue BID scheduled haldol.  Patient is more interactive and awake today.  She still is not taking in sufficient nutrition/hydration.  Her daughter remains hopeful that she will improve enough to return to [facility].  Plan to complete abx and continue to assess daily.  Plan will either be to [facility] or residential hospice.   3. Symptom Management:      1. As above   4. Palliative Prophylaxis:   Delirium Protocol  Standard delirium management (adapted from NICE guidelines 2011 for prevention of delirium):  Provide continuity of care when possible (avoid frequent changing of surroundings and staff).  Frequent reorientation to time with:  A clock should always  be visible.  Make sure Calendar/white board is updated.  Lights on/blinds open during the day and off/closed at night.  Encourage frequent family visits.  Monitor for and treat dehydration/constipation.  Optimize oxygen saturation.  Avoid catheters and IV's when possible and look for/treat infections.  Encourage early mobility.  Assess and treat pain.  Ensure adequate nutrition and functioning dentures.  Address reversible causes of hearing and visual impairment:  Use pocket talker if hearing aids are unavailable.  Avoid sleep disturbance (normalize sleep/wake cycle).  Minimize disturbances and consider NOT obtaining vitals at night if possible.  Review Medications to avoid polypharmacy and avoid deliriogenic medications when possible:  Benzodiazepines.  Dihydropyridines.  Antihistamines.  Anticholinergics.  (Possibly avoid: H2 blockers, tricyclic antidepressants, antiparkinson medications, steroids, NSAID's).   5. Prognosis: Unable to determine but prognosis guarded given advanced age and multiple acute and underlying co morbidities including compromised nutrition. I'm concerned this is  irreversible decline and, while her mental status is improved today, she will continue to decompensate over the next few days/weeks.   6. Discharge Planning:  [place] for rehab with Palliative care service follow-up most likely   Care plan was discussed with  Patient, her daughter, and Dr. Ree Kida   Thank you for allowing the Palliative Medicine Team to assist in the care of this patient.   Time In:  1000 Time Out: 1030 Total Time 30 Prolonged Time Billed  no        [phone] Micheline Rough, MD  05/09/2015, 11:08 AM  Please contact Palliative Medicine Team phone at [phone] for questions and concerns.

## 2015-05-09 NOTE — Progress Notes (Signed)
[facility]                                                                              Patient Demographics  [name], is a 82 y.o. female, DOB - 25-Oct-1919, [id number]  Admit date - 05/03/2015   Admitting Physician Ivor Costa, MD  Outpatient Primary MD for the patient is Jani Gravel, MD  LOS - 6   Chief Complaint  Patient presents with  . Hypoglycemia  . Altered Mental Status      HPI on 05/03/2015 by Dr. Elsie Lincoln is a 82 y.o. female with PMH of hypothyroidism, diastolic congestive heart failure, atrial flutter not on anticoagulants due to old age, dementia and high risk of fall, stroke, chronic kidney disease-stage IV, BPH, recent right transmetatarsal amputation due to chronic osteomyelitis, who presents with altered mental status and hypoglycemia.  Patient was recently hospitalized from 3/16 to 3.18 due to stroke. She also had right transmetatarsal amputation due to chronic osteomyelitis in that admission. She was discharged to nursing home for rehabilitation. She is getting Tigecycline by IV in SNF. Per report, pt was found to have AMS and hypoglycemia with blood sugar 45 in her nursing home. When I saw pt in ED, pt is not oriented x 3, could not provide any history. She moves extremities upon painful stimulus. She dose not seem to have cough, nausea, vomiting or diarrhea.  In ED, patient was found to have WBC 19.7, CBG 146 on arrival, positive urinalysis with large amount of leukocytes, hypothermia with temperature 94.7, oxygen desaturation to 86%, slightly bradycardia, stable renal function. Chest x-ray is negative for acute abnormalities. CT head is negative for acute intracranial abnormalities. Patient is admitted to inpatient for further intervention treatment.  Assessment & Plan   Sepsis secondary to UTI -Patient has positive urinalysis with large amount of leukocyte, consistent with UTI.  -Upon admission, patient had leukocytosis, hypothermia and  encephalopathy.  -WBC normalized, VSS stable -Patient was on Tygacil and as well as fluconazole -Infectious disease recommended discontinuing antibiotics and starting her on Primaxin -main problem and barrier for discharge is delirium and inability for meaningful PO intake/medications -Spoke with Dr. Domingo Cocking, palliative care, unsure of patient's true baseline.  Not sure that patient will recover from this.   -Patient will continue primaxin for additional day.  If patient continues to improve, will discharge her to SNF with palliative care to follow.  If patient declines, plan for possible residential hospice.  Acute encephalopathy -Most likely due to UTI and sepsis. -patient more awake today and interactive -Treating UTI as above -Follow clinical response   Hx of stroke/TIA -continue ASA -recent admission for TIA -completed work up approx 3 weeks ago -discussed with neurology and will monitor for now -patient is back to baseline -will consider using plavix and low dose ASA when able to take PO  Hypertension -continue IV metoprolol with holding parameter  History of Afib/flutter:  -CHA2DS2-VASc Score is 6, needs oral anticoagulation, but patient is not on [facility] due to old age, dementia and high risk of fall.  -HR is well controled. -on IV metoprolol -ASA for prevention  Dysphagia -Speech recommending full liquid diet -very high  risk for aspiration  Hypothyroidism -TSH WNL -Continue IV synthroid, given dysphagia  Diabetes Mellitus, type II -Last A1c 11.9 on 04/11/15, poorly controlled.  -Patient is taking Lantus at home -Continue ISS. Blood sugars elevated, will restart lantus 9u daily -patient still not eating much and a high risk for hypoglycemia   S/P transmetatarsal amputation of right foot  -due to chronic osteomyelitis -will discontinue IV Tigecycline as recommended by ID -consult wound care for dressing instructions and care  Chronic kidney disease (CKD), stage  IV (severe)  -Creatinine is 1.4 on admission. Currently 1.54 -Continue to monitor [facility]  Hospital acquired delirium -Continue seroquel, haldol  Code Status: DNR   Family Communication: None at bedside  Disposition Plan: Admitted.  Continue to monitor patient. Poor oral intake.   Time Spent in minutes   30 minutes  Procedures  None  Consults   Neurology curbside (during acute event of TIA) Palliative Care ID (Dr. Linus Salmons; Will d/c tigicycline and fluconazole; complete 5 days total of primaxin.)  DVT Prophylaxis  Lovenox  Lab Results  Component Value Date   PLT 392 05/08/2015    Medications  Scheduled Meds: . aspirin  150 mg Rectal Daily  . enoxaparin (LOVENOX) injection  30 mg Subcutaneous Q24H  . haloperidol lactate  0.5 mg Intravenous BID  . heparin flush  50 Units Intravenous Q12H  . imipenem-cilastatin  250 mg Intravenous Q6H  . insulin aspart  0-9 Units Subcutaneous TID WC  . levothyroxine  75 mcg Intravenous QAC breakfast  . metoprolol  5 mg Intravenous 3 times per day  . saccharomyces boulardii  250 mg Oral BID  . sodium chloride  2,500 mL Intravenous Once  . sodium chloride flush  3 mL Intravenous Q12H   Continuous Infusions: . sodium chloride 75 mL/hr at 05/09/15 0349   PRN Meds:.acetaminophen **OR** acetaminophen, hydrALAZINE, ondansetron (ZOFRAN) IV, sodium chloride flush  Antibiotics    Anti-infectives    Start     Dose/Rate Route Frequency Ordered Stop   05/05/15 1800  imipenem-cilastatin (PRIMAXIN) 250 mg in sodium chloride 0.9 % 100 mL IVPB     250 mg 200 mL/hr over 30 Minutes Intravenous Every 6 hours 05/05/15 1731 05/10/15 1759   05/04/15 2359  imipenem-cilastatin (PRIMAXIN) 250 mg in sodium chloride 0.9 % 100 mL IVPB  Status:  Discontinued     250 mg 200 mL/hr over 30 Minutes Intravenous Every 12 hours 05/03/15 1323 05/04/15 0334   05/04/15 2200  fluconazole (DIFLUCAN) IVPB 100 mg  Status:  Discontinued     100 mg 50 mL/hr over 60 Minutes  Intravenous Every 24 hours 05/04/15 2131 05/05/15 1707   05/04/15 0400  imipenem-cilastatin (PRIMAXIN) 250 mg in sodium chloride 0.9 % 100 mL IVPB  Status:  Discontinued     250 mg 200 mL/hr over 30 Minutes Intravenous Every 6 hours 05/04/15 0334 05/04/15 2121   05/03/15 1200  imipenem-cilastatin (PRIMAXIN) 250 mg in sodium chloride 0.9 % 100 mL IVPB  Status:  Discontinued     250 mg 200 mL/hr over 30 Minutes Intravenous 4 times per day 05/03/15 0502 05/03/15 1323   05/03/15 1000  tigecycline (TYGACIL) injection 50 mg  Status:  Discontinued     50 mg Intravenous Every 12 hours 05/03/15 0450 05/03/15 0454   05/03/15 1000  tigecycline (TYGACIL) 50 mg in sodium chloride 0.9 % 100 mL IVPB  Status:  Discontinued     50 mg 200 mL/hr over 30 Minutes Intravenous Every 12 hours 05/03/15 0455 05/05/15  1707   05/03/15 0515  imipenem-cilastatin (PRIMAXIN) 250 mg in sodium chloride 0.9 % 100 mL IVPB     250 mg 200 mL/hr over 30 Minutes Intravenous STAT 05/03/15 0501 05/03/15 0635   05/03/15 0430  vancomycin (VANCOCIN) IVPB 1000 mg/200 mL premix  Status:  Discontinued     1,000 mg 200 mL/hr over 60 Minutes Intravenous  Once 05/03/15 0416 05/03/15 0450   05/03/15 0430  piperacillin-tazobactam (ZOSYN) IVPB 3.375 g  Status:  Discontinued     3.375 g 100 mL/hr over 30 Minutes Intravenous  Once 05/03/15 0416 05/03/15 0436     Subjective:   [name] seen and examined today.  Patient more awake this morning.  States she "feels fine."  Has no complaints.    Objective:   Filed Vitals:   05/08/15 0629 05/08/15 1500 05/08/15 2055 05/09/15 0508  BP: 152/52 113/68 177/56 181/75  Pulse:   73 83  Temp: 98.4 F (36.9 C) 98.2 F (36.8 C) 98.1 F (36.7 C) 98.6 F (37 C)  TempSrc: Axillary Axillary Oral Axillary  Resp:   18 16  Weight:      SpO2: 96% 98% 96% 98%    Wt Readings from Last 3 Encounters:  05/04/15 71.668 kg (158 lb)  04/30/15 74.844 kg (165 lb)  04/28/15 74.844 kg (165 lb)    No  intake or output data in the 24 hours ending 05/09/15 1221  Exam  General: Well developed, Elderly, NAD  HEENT: NCAT, mucous membranes moist.   Cardiovascular: S1 S2 auscultated, regular  Respiratory: Clear to auscultation bilaterally   Abdomen: Soft, nontender, nondistended, + bowel sounds  Extremities: warm dry without cyanosis clubbing or edema. RLE wrapped.  Small soft mass right calf- no erythema, induration, or drainage.  Neuro: AAOx1, nonfocal  Data Review   Micro Results Recent Results (from the past 240 hour(s))  Urine culture     Status: Abnormal   Collection Time: 05/03/15  2:55 AM  Result Value Ref Range Status   Specimen Description URINE, CATHETERIZED  Final   Special Requests Normal  Final   Culture >=100,000 COLONIES/mL YEAST (A)  Final   Report Status 05/04/2015 FINAL  Final  Blood culture (routine x 2)     Status: None   Collection Time: 05/03/15  4:03 AM  Result Value Ref Range Status   Specimen Description BLOOD RIGHT HAND  Final   Special Requests BOTTLES DRAWN AEROBIC AND ANAEROBIC 5CC  Final   Culture   Final    NO GROWTH 5 DAYS Performed at [facility]    Report Status 05/08/2015 FINAL  Final  Blood culture (routine x 2)     Status: None   Collection Time: 05/03/15  4:09 AM  Result Value Ref Range Status   Specimen Description BLOOD LEFT HAND  Final   Special Requests BOTTLES DRAWN AEROBIC AND ANAEROBIC 5CC  Final   Culture   Final    NO GROWTH 5 DAYS Performed at [facility]    Report Status 05/08/2015 FINAL  Final  C difficile quick scan w PCR reflex     Status: None   Collection Time: 05/06/15  2:51 AM  Result Value Ref Range Status   C Diff antigen NEGATIVE NEGATIVE Final   C Diff toxin NEGATIVE NEGATIVE Final   C Diff interpretation Negative for toxigenic C. difficile  Final    Radiology Reports Dg Chest 2 View  05/03/2015  CLINICAL DATA:  Hypoglycemia.  Cough and congestion. EXAM:  CHEST  2 VIEW COMPARISON:   04/10/2015 FINDINGS: There is a right upper extremity PICC line extending into the low SVC. There is mild vascular and interstitial prominence which is unchanged. No consolidation. No effusions. Unchanged mild cardiomegaly. IMPRESSION: Unchanged mild cardiomegaly and vascular/interstitial prominence. No consolidation or effusion. Electronically Signed   By: Andreas Newport M.D.   On: 05/03/2015 01:51   Ct Head Wo Contrast  05/04/2015  CLINICAL DATA:  Sudden onset of altered mental status approximately 45 minutes ago. EXAM: CT HEAD WITHOUT CONTRAST TECHNIQUE: Contiguous axial images were obtained from the base of the skull through the vertex without intravenous contrast. COMPARISON:  05/03/2015 FINDINGS: The ventricles are normal in configuration. There is ventricular and sulcal enlargement reflecting moderate atrophy. No hydrocephalus. There are no parenchymal masses or mass effect. There is no evidence of a recent cortical infarct. Patchy white matter hypoattenuation is noted, stable from the prior exam, consistent with mild chronic microvascular ischemic change. There are no extra-axial masses or abnormal fluid collections. There is no intracranial hemorrhage. The visualized sinuses are clear. IMPRESSION: 1. No acute intracranial abnormalities. No evidence of a recent cortical infarct or of intracranial hemorrhage. 2. Atrophy and mild chronic microvascular ischemic change. These results were called by telephone at the time of interpretation on 05/04/2015 at 11:24 am to nurse Alease Medina, who verbally acknowledged these results. Electronically Signed   By: Lajean Manes M.D.   On: 05/04/2015 11:25   Ct Head Wo Contrast  05/03/2015  CLINICAL DATA:  Hypoglycemia.  Altered mental status. EXAM: CT HEAD WITHOUT CONTRAST TECHNIQUE: Contiguous axial images were obtained from the base of the skull through the vertex without intravenous contrast. COMPARISON:  04/10/2015 FINDINGS: There is no intracranial hemorrhage,  mass or evidence of acute infarction. There is no extra-axial fluid collection. There is moderate generalized atrophy. There is white matter hypodensity, chronic and likely due to small vessel disease. No acute intracranial findings are evident. No interval change is evident. No bony abnormalities evident. The visible paranasal sinuses are clear. IMPRESSION: Generalized atrophy and chronic white matter hypodensities, likely small vessel disease. No acute intracranial finding. Electronically Signed   By: Andreas Newport M.D.   On: 05/03/2015 01:52   Ct Head Wo Contrast  04/10/2015  CLINICAL DATA:  Slurred speech EXAM: CT HEAD WITHOUT CONTRAST TECHNIQUE: Contiguous axial images were obtained from the base of the skull through the vertex without intravenous contrast. COMPARISON:  08/27/2013 FINDINGS: The bony calvarium is intact. No gross soft tissue abnormality is noted. Diffuse atrophic changes are noted. Areas of chronic white matter ischemic change are seen. No findings to suggest acute hemorrhage, acute infarction or space-occupying mass lesion are noted. IMPRESSION: Chronic atrophic and ischemic changes.  No acute abnormality noted. These results were called by telephone at the time of interpretation on 04/10/2015 at 10:38 am to Dr. Silverio Decamp, who verbally acknowledged these results. Electronically Signed   By: Inez Catalina M.D.   On: 04/10/2015 10:38   Mr Virgel Paling Wo Contrast  04/10/2015  CLINICAL DATA:  82 year old diabetic hypertensive female with prior stroke presenting with near syncopal episode and slurred speech. Subsequent encounter. EXAM: MRI HEAD WITHOUT CONTRAST MRA HEAD WITHOUT CONTRAST TECHNIQUE: Multiplanar, multiecho pulse sequences of the brain and surrounding structures were obtained without intravenous contrast. Angiographic images of the head were obtained using MRA technique without contrast. COMPARISON:  04/10/2015 CT.  02/04/2012 MR and MR angiogram. FINDINGS: MRI HEAD FINDINGS No  acute infarct or intracranial hemorrhage. Moderate global atrophy  without hydrocephalus. Mild to moderate chronic small vessel disease changes. No intracranial mass lesion noted on this unenhanced exam. Partial opacification left mastoid air cells without obstructing lesion of eustachian tube. Post lens replacement otherwise orbital structures unremarkable. Cervical spondylotic changes with spinal stenosis and cord flattening most notable C2-3 followed by the C3-4 and C4-5 level. MRA HEAD FINDINGS Anterior circulation without medium or large size vessel significant stenosis or occlusion. Moderate moderate narrowing of portions of the middle cerebral artery branches and A2 segment anterior cerebral artery bilaterally. No high-grade stenosis distal vertebral arteries or basilar artery. Moderate posterior circulation branch vessel narrowing and irregularity including high-grade stenosis proximal and distal aspect of the posterior cerebral arteries more notable on the right. No aneurysm noted. IMPRESSION: MRI HEAD No acute infarct or intracranial hemorrhage. Moderate global atrophy without hydrocephalus. Mild to moderate chronic small vessel disease changes. No intracranial mass lesion noted on this unenhanced exam. Partial opacification left mastoid air cells. Cervical spondylotic changes with spinal stenosis and cord flattening most notable C2-3 followed by the C3-4 and C4-5 level. MRA HEAD Anterior circulation without medium or large size vessel significant stenosis or occlusion. Moderate moderate narrowing of portions of the middle cerebral artery branches and A2 segment anterior cerebral artery bilaterally. No high-grade stenosis distal vertebral arteries or basilar artery. Moderate posterior circulation branch vessel narrowing and irregularity. High-grade stenosis proximal and distal aspect of the posterior cerebral arteries more notable on the right. Electronically Signed   By: Genia Del M.D.   On: 04/10/2015  14:52   Mr Brain Wo Contrast  04/10/2015  CLINICAL DATA:  81 year old diabetic hypertensive female with prior stroke presenting with near syncopal episode and slurred speech. Subsequent encounter. EXAM: MRI HEAD WITHOUT CONTRAST MRA HEAD WITHOUT CONTRAST TECHNIQUE: Multiplanar, multiecho pulse sequences of the brain and surrounding structures were obtained without intravenous contrast. Angiographic images of the head were obtained using MRA technique without contrast. COMPARISON:  04/10/2015 CT.  02/04/2012 MR and MR angiogram. FINDINGS: MRI HEAD FINDINGS No acute infarct or intracranial hemorrhage. Moderate global atrophy without hydrocephalus. Mild to moderate chronic small vessel disease changes. No intracranial mass lesion noted on this unenhanced exam. Partial opacification left mastoid air cells without obstructing lesion of eustachian tube. Post lens replacement otherwise orbital structures unremarkable. Cervical spondylotic changes with spinal stenosis and cord flattening most notable C2-3 followed by the C3-4 and C4-5 level. MRA HEAD FINDINGS Anterior circulation without medium or large size vessel significant stenosis or occlusion. Moderate moderate narrowing of portions of the middle cerebral artery branches and A2 segment anterior cerebral artery bilaterally. No high-grade stenosis distal vertebral arteries or basilar artery. Moderate posterior circulation branch vessel narrowing and irregularity including high-grade stenosis proximal and distal aspect of the posterior cerebral arteries more notable on the right. No aneurysm noted. IMPRESSION: MRI HEAD No acute infarct or intracranial hemorrhage. Moderate global atrophy without hydrocephalus. Mild to moderate chronic small vessel disease changes. No intracranial mass lesion noted on this unenhanced exam. Partial opacification left mastoid air cells. Cervical spondylotic changes with spinal stenosis and cord flattening most notable C2-3 followed by the  C3-4 and C4-5 level. MRA HEAD Anterior circulation without medium or large size vessel significant stenosis or occlusion. Moderate moderate narrowing of portions of the middle cerebral artery branches and A2 segment anterior cerebral artery bilaterally. No high-grade stenosis distal vertebral arteries or basilar artery. Moderate posterior circulation branch vessel narrowing and irregularity. High-grade stenosis proximal and distal aspect of the posterior cerebral arteries more notable on the  right. Electronically Signed   By: Genia Del M.D.   On: 04/10/2015 14:52   Ir Fluoro Guide Cv Line Right  04/29/2015  INDICATION: 82 year old female with a history of foot infection and requirement of antibiotics. EXAM: PICC LINE PLACEMENT WITH ULTRASOUND AND FLUOROSCOPIC GUIDANCE MEDICATIONS: None ANESTHESIA/SEDATION: None FLUOROSCOPY TIME:  Fluoroscopy Time: 0 minutes 12 seconds (1 mGy). COMPLICATIONS: None PROCEDURE: The patient was advised of the possible risks and complications and agreed to undergo the procedure. The patient was then brought to the angiographic suite for the procedure. The right arm was prepped with chlorhexidine, draped in the usual sterile fashion using maximum barrier technique (cap and mask, sterile gown, sterile gloves, large sterile sheet, hand hygiene and cutaneous antisepsis) and infiltrated locally with 1% Lidocaine. Ultrasound demonstrated patency of the right basilic vein, and this was documented with an image. Under real-time ultrasound guidance, this vein was accessed with a 21 gauge micropuncture needle and image documentation was performed. A 0.018 wire was introduced in to the vein. Over this, a 5 [place] single lumen power injectable PICC was advanced to the superior SVC/right atrial junction. Fluoroscopy during the procedure and fluoro spot radiograph confirms appropriate catheter position. The catheter was flushed and covered with asterile dressing. Catheter length: 40 cm  IMPRESSION: Status post right basilic vein PICC placement. 40 cm single-lumen power injectable PICC placed. Catheter ready for use. Signed, Dulcy Fanny. Earleen Newport, DO [facility] Radiology Electronically Signed   By: Corrie Mckusick D.O.   On: 04/29/2015 15:05   Ir US Guide Vasc Access Right  04/29/2015  INDICATION: 82 year old female with a history of foot infection and requirement of antibiotics. EXAM: PICC LINE PLACEMENT WITH ULTRASOUND AND FLUOROSCOPIC GUIDANCE MEDICATIONS: None ANESTHESIA/SEDATION: None FLUOROSCOPY TIME:  Fluoroscopy Time: 0 minutes 12 seconds (1 mGy). COMPLICATIONS: None PROCEDURE: The patient was advised of the possible risks and complications and agreed to undergo the procedure. The patient was then brought to the angiographic suite for the procedure. The right arm was prepped with chlorhexidine, draped in the usual sterile fashion using maximum barrier technique (cap and mask, sterile gown, sterile gloves, large sterile sheet, hand hygiene and cutaneous antisepsis) and infiltrated locally with 1% Lidocaine. Ultrasound demonstrated patency of the right basilic vein, and this was documented with an image. Under real-time ultrasound guidance, this vein was accessed with a 21 gauge micropuncture needle and image documentation was performed. A 0.018 wire was introduced in to the vein. Over this, a 5 [place] single lumen power injectable PICC was advanced to the superior SVC/right atrial junction. Fluoroscopy during the procedure and fluoro spot radiograph confirms appropriate catheter position. The catheter was flushed and covered with asterile dressing. Catheter length: 40 cm IMPRESSION: Status post right basilic vein PICC placement. 40 cm single-lumen power injectable PICC placed. Catheter ready for use. Signed, Dulcy Fanny. Earleen Newport, DO [facility] Radiology Electronically Signed   By: Corrie Mckusick D.O.   On:  04/29/2015 15:05   Dg Chest Port 1 View  04/10/2015  CLINICAL DATA:  Chronic nonproductive cough EXAM: PORTABLE CHEST 1 VIEW COMPARISON:  October 24, 2014 FINDINGS: There is no edema or consolidation. The heart size and pulmonary vascularity are normal. No adenopathy. No bone lesions. IMPRESSION: No edema or consolidation. Electronically Signed   By: Lowella Grip III M.D.   On: 04/10/2015 10:52    CBC  Recent Labs Lab 05/03/15 0139 05/03/15 0147 05/03/15 0553 05/05/15 0422 05/08/15 0420  WBC 19.7*  --  13.1* 9.7 14.0*  HGB 10.6* 11.2* 9.5* 10.6* 11.3*  HCT 32.2* 33.0* 29.3* 32.2* 35.1*  PLT 340  --  331 356 392  MCV 84.1  --  83.0 81.3 83.4  MCH 27.7  --  26.9 26.8 26.8  MCHC 32.9  --  32.4 32.9 32.2  RDW 15.1  --  15.1 14.6 15.3  LYMPHSABS 1.8  --   --   --   --   MONOABS 1.1*  --   --   --   --   EOSABS 0.3  --   --   --   --   BASOSABS 0.1  --   --   --   --     Chemistries   Recent Labs Lab 05/03/15 0147 05/03/15 0553 05/05/15 0422 05/08/15 0420  NA 138 136 137 147*  K 4.9 5.0 4.5 4.4  CL 104 107 108 114*  CO2  --  21* 23 21*  GLUCOSE 221* 174* 212* 278*  BUN 45* 51* 32* 35*  CREATININE 1.40* 1.37* 1.21* 1.54*  CALCIUM  --  8.1* 8.3* 8.7*  AST  --  12*  --   --   ALT  --  12*  --   --   ALKPHOS  --  67  --   --   BILITOT  --  0.3  --   --    ------------------------------------------------------------------------------------------------------------------ estimated creatinine clearance is 23 mL/min (by C-G formula based on Cr of 1.54). ------------------------------------------------------------------------------------------------------------------ No results for input(s): HGBA1C in the last 72 hours. ------------------------------------------------------------------------------------------------------------------ No results for input(s): CHOL, HDL, LDLCALC, TRIG, CHOLHDL, LDLDIRECT in the last 72  hours. ------------------------------------------------------------------------------------------------------------------ No results for input(s): TSH, T4TOTAL, T3FREE, THYROIDAB in the last 72 hours.  Invalid input(s): FREET3 ------------------------------------------------------------------------------------------------------------------ No results for input(s): VITAMINB12, FOLATE, FERRITIN, TIBC, IRON, RETICCTPCT in the last 72 hours.  Coagulation profile  Recent Labs Lab 05/03/15 0553  INR 1.34    No results for input(s): DDIMER in the last 72 hours.  Cardiac Enzymes No results for input(s): CKMB, TROPONINI, MYOGLOBIN in the last 168 hours.  Invalid input(s): CK ------------------------------------------------------------------------------------------------------------------ Invalid input(s): POCBNP    Joycelyn Liska D.O. on 05/09/2015 at 12:21 PM  Between 7am to 7pm - Pager - [phone]  After 7pm go to [email or web address] - password [email or web address]  And look for the night coverage person covering for me after hours  [facility] Office  [phone]

## 2015-05-09 NOTE — Progress Notes (Signed)
Occupational Therapy Treatment Patient Details Name: [name] MRN: [id number] DOB: April 16, 1919 Today's Date: 05/09/2015    History of present illness 82 yo female admitted to [facility] with hyopglycemia and altered mental status.    OT comments  This 82 yo female admitted with above presents to acute OT not make progress towards goals--will update them. She will continue to trial OT for 1 more week to see if progress can be made.  Follow Up Recommendations  SNF;Supervision/Assistance - 24 hour    Equipment Recommendations  None recommended by OT       Precautions / Restrictions Precautions Precautions: Fall Required Braces or Orthoses: Other Brace/Splint Other Brace/Splint: Darco Shoe (from previous note)--currently has Prevalon boots for both LEs  Restrictions Weight Bearing Restrictions: No RLE Weight Bearing: Weight bearing as tolerated Other Position/Activity Restrictions: in darco shoe        Mobility Bed Mobility Overal bed mobility: Needs Assistance;+2 for physical assistance Bed Mobility: Rolling Rolling:  (total A to her left (with resistance); Mod A her right (no resistance))                   ADL Overall ADL's : Needs assistance/impaired     Grooming: Wash/dry face;Total assistance;Bed level                       Toileting- Clothing Manipulation and Hygiene: Total assistance;Bed level                Vision                 Additional Comments: intermittent eyes open during session          Cognition   Behavior During Therapy: Flat affect (other than grimacing at times) Overall Cognitive Status: No family/caregiver present to determine baseline cognitive functioning (decreased following commands)                                    Pertinent Vitals/ Pain       Pain Assessment: Faces Faces Pain Scale: Hurts whole lot Pain Location: with rolling in bed to her left and peri-care (very excoriated from urine and  feces) Pain Descriptors / Indicators: Grimacing;Guarding;Moaning Pain Intervention(s): Monitored during session;Repositioned         Frequency Min 2X/week     Progress Toward Goals  OT Goals(current goals can now be found in the care plan section)  Progress towards OT goals: Not progressing toward goals - comment (doing worse overall, goals will be modified)     Plan Discharge plan remains appropriate       End of Session     Activity Tolerance Patient limited by pain;Patient limited by lethargy   Patient Left in bed;with call bell/phone within reach;with nursing/sitter in room   Nurse Communication  (nursing in to help get pt cleaned up and to give meds)        Time: [id number] OT Time Calculation (min): 21 min  Charges: OT General Charges $OT Visit: 1 Procedure OT Treatments $Self Care/Home Management : 8-22 mins  Almon Register [id number] 05/09/2015, 12:24 PM

## 2015-05-09 NOTE — Progress Notes (Signed)
Physical Therapy Treatment Patient Details Name: [name] MRN: [id number] DOB: October 23, 1919 Today's Date: 05/09/2015    History of Present Illness 82 yo female admitted to [facility] with hyopglycemia and altered mental status.     PT Comments    Able to sit pt up at EOB with +2 assist. Once upright, pt sat EOB ~5 minutes with Mod to brief periods of Min guard assist. Pt grimaces with activity. Recommend SNF.   Follow Up Recommendations  SNF     Equipment Recommendations  None recommended by PT    Recommendations for Other Services       Precautions / Restrictions Precautions Precautions: Fall Required Braces or Orthoses: Other Brace/Splint Other Brace/Splint: Darco Shoe (from previous note)--currently has Prevalon boots for both LEs  Restrictions Weight Bearing Restrictions: No RLE Weight Bearing: Weight bearing as tolerated Other Position/Activity Restrictions: in darco shoe     Mobility  Bed Mobility Overal bed mobility: Needs Assistance Bed Mobility: Supine to Sit;Sit to Supine Rolling:  (total A to her left (with resistance); Mod A her right (no resistance))   Supine to sit: Total assist;+2 for physical assistance;+2 for safety/equipment Sit to supine: Total assist;+2 for physical assistance;+2 for safety/equipment   General bed mobility comments: Assist for trunk and bil LEs. Resistive at times. Utilized bedpad for scooting, positioning.   Transfers                 General transfer comment: NT  Ambulation/Gait                 Stairs            Wheelchair Mobility    Modified Rankin (Stroke Patients Only)       Balance Overall balance assessment: Needs assistance Sitting-balance support: Feet supported Sitting balance-Leahy Scale: Poor Sitting balance - Comments: Sat EOB ~5 minutes with varying level of assist. Mod-Min guard. Postural control: Posterior lean                          Cognition Arousal/Alertness:  Awake/alert Behavior During Therapy: WFL for tasks assessed/performed (pt smiled. gave some yes/no head shaking responses. grimacing) Overall Cognitive Status: No family/caregiver present to determine baseline cognitive functioning Area of Impairment: Following commands       Following Commands: Follows one step commands inconsistently            Exercises General Exercises - Upper Extremity Shoulder Flexion: AAROM;Both;10 reps;Seated General Exercises - Lower Extremity Long Arc Quad: AAROM;Both;5 reps;Seated    General Comments        Pertinent Vitals/Pain Pain Assessment: Faces Faces Pain Scale: Hurts even more Pain Location: with activity Pain Descriptors / Indicators: Grimacing;Guarding;Moaning Pain Intervention(s): Limited activity within patient's tolerance;Repositioned    Home Living                      Prior Function            PT Goals (current goals can now be found in the care plan section) Progress towards PT goals: Progressing toward goals (will follow on trial basis to assess pt's ability to progress)    Frequency  Min 2X/week    PT Plan Current plan remains appropriate    Co-evaluation             End of Session   Activity Tolerance: Patient tolerated treatment well Patient left: in bed;with call bell/phone within reach;with bed alarm set  Time: [id number] PT Time Calculation (min) (ACUTE ONLY): 15 min  Charges:  $Therapeutic Activity: 8-22 mins                    G Codes:      Weston Anna, MPT Pager: [phone]

## 2015-05-09 NOTE — Progress Notes (Signed)
Inpatient Diabetes Program Recommendations  AACE/ADA: New Consensus Statement on Inpatient Glycemic Control (2015)  Target Ranges:  Prepandial:   less than 140 mg/dL      Peak postprandial:   less than 180 mg/dL (1-2 hours)      Critically ill patients:  140 - 180 mg/dL   Results for JILLIA, RHUDY (MRN [id number]) as of 05/09/2015 10:13  Ref. Range 05/08/2015 08:58 05/08/2015 12:22 05/08/2015 17:18 05/08/2015 22:31  Glucose-Capillary Latest Ref Range: 65-99 mg/dL 275 (H) 240 (H) 233 (H) 241 (H)   Results for ARLEIGH, DERKS (MRN [id number]) as of 05/09/2015 10:13  Ref. Range 05/09/2015 08:07  Glucose-Capillary Latest Ref Range: 65-99 mg/dL 283 (H)    Admit with: AMS/ Hypoglycemia/ UTI/ Sepsis  History: DM, CKD, CVA, CHF  Home DM Meds: Lantus 35 units QAM  Novolog 0-12 units tid per SSI  Current Insulin Orders: Novolog Sensitive Correction Scale/ SSI (0-9 units) TID AC    MD- Note patient admitted with Hypoglycemia, however, patient now having sustained elevated glucose levels.  If within goals of care for this patient, please consider starting a portion of patient's home dose of Lantus- Recommend Lantus 9 units daily (25% of home dose to start)    --Will follow patient during hospitalization--  Wyn Quaker RN, MSN, CDE Diabetes Coordinator Inpatient Glycemic Control Team Team Pager: [phone] (8a-5p)

## 2015-05-10 LAB — BASIC METABOLIC PANEL
Anion gap: 10 (ref 5–15)
BUN: 19 mg/dL (ref 6–20)
CALCIUM: 8.1 mg/dL — AB (ref 8.9–10.3)
CO2: 21 mmol/L — ABNORMAL LOW (ref 22–32)
CREATININE: 1.06 mg/dL — AB (ref 0.44–1.00)
Chloride: 107 mmol/L (ref 101–111)
GFR, EST AFRICAN AMERICAN: 51 mL/min — AB (ref >60)
GFR, EST NON AFRICAN AMERICAN: 44 mL/min — AB (ref >60)
Glucose, Bld: 259 mg/dL — ABNORMAL HIGH (ref 65–99)
Potassium: 3.5 mmol/L (ref 3.5–5.1)
SODIUM: 138 mmol/L (ref 135–145)

## 2015-05-10 LAB — GLUCOSE, CAPILLARY
GLUCOSE-CAPILLARY: 257 mg/dL — AB (ref 65–99)
GLUCOSE-CAPILLARY: 168 mg/dL — AB (ref 65–99)
GLUCOSE-CAPILLARY: 106 mg/dL — AB (ref 65–99)
Glucose-Capillary: 106 mg/dL — ABNORMAL HIGH (ref 65–99)

## 2015-05-10 LAB — CBC
HCT: 31.9 % — ABNORMAL LOW (ref 36.0–46.0)
Hemoglobin: 10.7 g/dL — ABNORMAL LOW (ref 12.0–15.0)
MCH: 26.8 pg (ref 26.0–34.0)
MCHC: 33.5 g/dL (ref 30.0–36.0)
MCV: 79.9 fL (ref 78.0–100.0)
PLATELETS: 361 10*3/uL (ref 150–400)
RBC: 3.99 MIL/uL (ref 3.87–5.11)
RDW: 14.8 % (ref 11.5–15.5)
WBC: 11.4 10*3/uL — ABNORMAL HIGH (ref 4.0–10.5)

## 2015-05-10 MED ORDER — ENOXAPARIN SODIUM 40 MG/0.4ML ~~LOC~~ SOLN
40.0000 mg | SUBCUTANEOUS | Status: DC
  Administered 2015-05-11 – 2015-05-12 (×2): 40 mg via SUBCUTANEOUS
  Filled 2015-05-10 (×2): qty 0.4

## 2015-05-10 MED ORDER — HALOPERIDOL LACTATE 5 MG/ML IJ SOLN: 0.5000 mg | INTRAMUSCULAR | Status: DC | PRN

## 2015-05-10 NOTE — Progress Notes (Signed)
[facility]                                                                              Patient Demographics  [name], is a 82 y.o. female, DOB - 1919/02/09, [id number]  Admit date - 05/03/2015   Admitting Physician Ivor Costa, MD  Outpatient Primary MD for the patient is Jani Gravel, MD  LOS - 7   Chief Complaint  Patient presents with  . Hypoglycemia  . Altered Mental Status      HPI on 05/03/2015 by Dr. Elsie Lincoln is a 82 y.o. female with PMH of hypothyroidism, diastolic congestive heart failure, atrial flutter not on anticoagulants due to old age, dementia and high risk of fall, stroke, chronic kidney disease-stage IV, BPH, recent right transmetatarsal amputation due to chronic osteomyelitis, who presents with altered mental status and hypoglycemia.  Patient was recently hospitalized from 3/16 to 3.18 due to stroke. She also had right transmetatarsal amputation due to chronic osteomyelitis in that admission. She was discharged to nursing home for rehabilitation. She is getting Tigecycline by IV in SNF. Per report, pt was found to have AMS and hypoglycemia with blood sugar 45 in her nursing home. When I saw pt in ED, pt is not oriented x 3, could not provide any history. She moves extremities upon painful stimulus. She dose not seem to have cough, nausea, vomiting or diarrhea.  In ED, patient was found to have WBC 19.7, CBG 146 on arrival, positive urinalysis with large amount of leukocytes, hypothermia with temperature 94.7, oxygen desaturation to 86%, slightly bradycardia, stable renal function. Chest x-ray is negative for acute abnormalities. CT head is negative for acute intracranial abnormalities. Patient is admitted to inpatient for further intervention treatment.  Assessment & Plan   Sepsis secondary to UTI -Patient has positive urinalysis with large amount of leukocyte, consistent with UTI.  -Upon admission, patient had leukocytosis, hypothermia and  encephalopathy.  -WBC normalized, VSS stable -Patient was on Tygacil and as well as fluconazole -Infectious disease recommended discontinuing antibiotics and starting her on Primaxin -main problem and barrier for discharge is delirium and inability for meaningful PO intake/medications -Spoke with Dr. Domingo Cocking, palliative care, unsure of patient's true baseline.  Not sure that patient will recover from this.   -Patient will continue primaxin for today and then discontinue, as patient would have completed antibiotic course.   -If patient continues to improve, will discharge her to SNF with palliative care to follow.  If patient declines, plan for possible residential hospice.  Acute encephalopathy -Most likely due to UTI and sepsis. -patient more awake today and interactive -Treating UTI as above -Follow clinical response   Hx of stroke/TIA -continue ASA -recent admission for TIA -completed work up approx 3 weeks ago -discussed with neurology and will monitor for now -patient is back to baseline -will consider using plavix and low dose ASA when able to take PO  Hypertension -continue IV metoprolol with holding parameter  History of Afib/flutter:  -CHA2DS2-VASc Score is 6, needs oral anticoagulation, but patient is not on [facility] due to old age, dementia and high risk of fall.  -HR is well controled. -on IV metoprolol -ASA for  prevention  Dysphagia -Speech recommending full liquid diet -very high risk for aspiration  Hypothyroidism -TSH WNL -Continue IV synthroid, given dysphagia  Diabetes Mellitus, type II -Last A1c 11.9 on 04/11/15, poorly controlled.  -Patient is taking Lantus at home -Continue ISS. Blood sugars elevated, will restart lantus 9u daily -patient still not eating much and a high risk for hypoglycemia   S/P transmetatarsal amputation of right foot  -due to chronic osteomyelitis -will discontinue IV Tigecycline as recommended by ID -consult wound care for  dressing instructions and care  Chronic kidney disease (CKD), stage IV (severe)  -Creatinine is 1.4 on admission. Currently 1.06 -Continue to monitor [facility]  Hospital acquired delirium -Continue seroquel, haldol  Code Status: DNR   Family Communication: None at bedside  Disposition Plan: Admitted.  Continue to monitor patient. Poor oral intake.   Time Spent in minutes   30 minutes  Procedures  None  Consults   Neurology curbside (during acute event of TIA) Palliative Care ID (Dr. Linus Salmons; Will d/c tigicycline and fluconazole; complete 5 days total of primaxin.)  DVT Prophylaxis  Lovenox  Lab Results  Component Value Date   PLT 361 05/10/2015    Medications  Scheduled Meds: . aspirin  150 mg Rectal Daily  . enoxaparin (LOVENOX) injection  30 mg Subcutaneous Q24H  . haloperidol lactate  0.5 mg Intravenous BID  . heparin flush  50 Units Intravenous Q12H  . insulin aspart  0-9 Units Subcutaneous TID WC  . insulin glargine  9 Units Subcutaneous Daily  . levothyroxine  75 mcg Intravenous QAC breakfast  . metoprolol  5 mg Intravenous 3 times per day  . saccharomyces boulardii  250 mg Oral BID  . sodium chloride  2,500 mL Intravenous Once  . sodium chloride flush  3 mL Intravenous Q12H   Continuous Infusions: . sodium chloride 75 mL/hr at 05/10/15 0548   PRN Meds:.acetaminophen **OR** acetaminophen, haloperidol lactate, hydrALAZINE, ondansetron (ZOFRAN) IV, sodium chloride flush  Antibiotics    Anti-infectives    Start     Dose/Rate Route Frequency Ordered Stop   05/05/15 1800  imipenem-cilastatin (PRIMAXIN) 250 mg in sodium chloride 0.9 % 100 mL IVPB     250 mg 200 mL/hr over 30 Minutes Intravenous Every 6 hours 05/05/15 1731 05/10/15 1204   05/04/15 2359  imipenem-cilastatin (PRIMAXIN) 250 mg in sodium chloride 0.9 % 100 mL IVPB  Status:  Discontinued     250 mg 200 mL/hr over 30 Minutes Intravenous Every 12 hours 05/03/15 1323 05/04/15 0334   05/04/15 2200   fluconazole (DIFLUCAN) IVPB 100 mg  Status:  Discontinued     100 mg 50 mL/hr over 60 Minutes Intravenous Every 24 hours 05/04/15 2131 05/05/15 1707   05/04/15 0400  imipenem-cilastatin (PRIMAXIN) 250 mg in sodium chloride 0.9 % 100 mL IVPB  Status:  Discontinued     250 mg 200 mL/hr over 30 Minutes Intravenous Every 6 hours 05/04/15 0334 05/04/15 2121   05/03/15 1200  imipenem-cilastatin (PRIMAXIN) 250 mg in sodium chloride 0.9 % 100 mL IVPB  Status:  Discontinued     250 mg 200 mL/hr over 30 Minutes Intravenous 4 times per day 05/03/15 0502 05/03/15 1323   05/03/15 1000  tigecycline (TYGACIL) injection 50 mg  Status:  Discontinued     50 mg Intravenous Every 12 hours 05/03/15 0450 05/03/15 0454   05/03/15 1000  tigecycline (TYGACIL) 50 mg in sodium chloride 0.9 % 100 mL IVPB  Status:  Discontinued     50  mg 200 mL/hr over 30 Minutes Intravenous Every 12 hours 05/03/15 0455 05/05/15 1707   05/03/15 0515  imipenem-cilastatin (PRIMAXIN) 250 mg in sodium chloride 0.9 % 100 mL IVPB     250 mg 200 mL/hr over 30 Minutes Intravenous STAT 05/03/15 0501 05/03/15 0635   05/03/15 0430  vancomycin (VANCOCIN) IVPB 1000 mg/200 mL premix  Status:  Discontinued     1,000 mg 200 mL/hr over 60 Minutes Intravenous  Once 05/03/15 0416 05/03/15 0450   05/03/15 0430  piperacillin-tazobactam (ZOSYN) IVPB 3.375 g  Status:  Discontinued     3.375 g 100 mL/hr over 30 Minutes Intravenous  Once 05/03/15 0416 05/03/15 0436     Subjective:   [name] seen and examined today.  Patient more awake this morning. Has no complaints.  Would like to get up.   Objective:   Filed Vitals:   05/09/15 1232 05/09/15 1500 05/09/15 2330 05/10/15 0500  BP: 160/85 179/51 169/47 176/63  Pulse: 73 64  81  Temp: 98.1 F (36.7 C) 98.3 F (36.8 C)  98 F (36.7 C)  TempSrc: Axillary Axillary  Axillary  Resp: 18 18 18 18   Height:      Weight:      SpO2: 99% 98% 97% 98%    Wt Readings from Last 3 Encounters:  05/04/15  71.668 kg (158 lb)  04/30/15 74.844 kg (165 lb)  04/28/15 74.844 kg (165 lb)     Intake/Output Summary (Last 24 hours) at 05/10/15 1414 Last data filed at 05/10/15 1300  Gross per 24 hour  Intake   5005 ml  Output      0 ml  Net   5005 ml    Exam  General: Well developed, Elderly, NAD  HEENT: NCAT, mucous membranes moist.   Cardiovascular: S1 S2 auscultated, regular  Respiratory: Clear to auscultation   Abdomen: Soft, nontender, nondistended, + bowel sounds  Extremities: warm dry without cyanosis clubbing or edema. RLE wrapped.  Small soft mass right calf- no erythema, induration, or drainage.  Neuro: AAOx1, nonfocal  Data Review   Micro Results Recent Results (from the past 240 hour(s))  Urine culture     Status: Abnormal   Collection Time: 05/03/15  2:55 AM  Result Value Ref Range Status   Specimen Description URINE, CATHETERIZED  Final   Special Requests Normal  Final   Culture >=100,000 COLONIES/mL YEAST (A)  Final   Report Status 05/04/2015 FINAL  Final  Blood culture (routine x 2)     Status: None   Collection Time: 05/03/15  4:03 AM  Result Value Ref Range Status   Specimen Description BLOOD RIGHT HAND  Final   Special Requests BOTTLES DRAWN AEROBIC AND ANAEROBIC 5CC  Final   Culture   Final    NO GROWTH 5 DAYS Performed at [facility]    Report Status 05/08/2015 FINAL  Final  Blood culture (routine x 2)     Status: None   Collection Time: 05/03/15  4:09 AM  Result Value Ref Range Status   Specimen Description BLOOD LEFT HAND  Final   Special Requests BOTTLES DRAWN AEROBIC AND ANAEROBIC 5CC  Final   Culture   Final    NO GROWTH 5 DAYS Performed at [facility]    Report Status 05/08/2015 FINAL  Final  C difficile quick scan w PCR reflex     Status: None   Collection Time: 05/06/15  2:51 AM  Result Value Ref Range Status   C Diff  antigen NEGATIVE NEGATIVE Final   C Diff toxin NEGATIVE NEGATIVE Final   C Diff interpretation  Negative for toxigenic C. difficile  Final    Radiology Reports Dg Chest 2 View  05/03/2015  CLINICAL DATA:  Hypoglycemia.  Cough and congestion. EXAM: CHEST  2 VIEW COMPARISON:  04/10/2015 FINDINGS: There is a right upper extremity PICC line extending into the low SVC. There is mild vascular and interstitial prominence which is unchanged. No consolidation. No effusions. Unchanged mild cardiomegaly. IMPRESSION: Unchanged mild cardiomegaly and vascular/interstitial prominence. No consolidation or effusion. Electronically Signed   By: Andreas Newport M.D.   On: 05/03/2015 01:51   Ct Head Wo Contrast  05/04/2015  CLINICAL DATA:  Sudden onset of altered mental status approximately 45 minutes ago. EXAM: CT HEAD WITHOUT CONTRAST TECHNIQUE: Contiguous axial images were obtained from the base of the skull through the vertex without intravenous contrast. COMPARISON:  05/03/2015 FINDINGS: The ventricles are normal in configuration. There is ventricular and sulcal enlargement reflecting moderate atrophy. No hydrocephalus. There are no parenchymal masses or mass effect. There is no evidence of a recent cortical infarct. Patchy white matter hypoattenuation is noted, stable from the prior exam, consistent with mild chronic microvascular ischemic change. There are no extra-axial masses or abnormal fluid collections. There is no intracranial hemorrhage. The visualized sinuses are clear. IMPRESSION: 1. No acute intracranial abnormalities. No evidence of a recent cortical infarct or of intracranial hemorrhage. 2. Atrophy and mild chronic microvascular ischemic change. These results were called by telephone at the time of interpretation on 05/04/2015 at 11:24 am to nurse Alease Medina, who verbally acknowledged these results. Electronically Signed   By: Lajean Manes M.D.   On: 05/04/2015 11:25   Ct Head Wo Contrast  05/03/2015  CLINICAL DATA:  Hypoglycemia.  Altered mental status. EXAM: CT HEAD WITHOUT CONTRAST TECHNIQUE:  Contiguous axial images were obtained from the base of the skull through the vertex without intravenous contrast. COMPARISON:  04/10/2015 FINDINGS: There is no intracranial hemorrhage, mass or evidence of acute infarction. There is no extra-axial fluid collection. There is moderate generalized atrophy. There is white matter hypodensity, chronic and likely due to small vessel disease. No acute intracranial findings are evident. No interval change is evident. No bony abnormalities evident. The visible paranasal sinuses are clear. IMPRESSION: Generalized atrophy and chronic white matter hypodensities, likely small vessel disease. No acute intracranial finding. Electronically Signed   By: Andreas Newport M.D.   On: 05/03/2015 01:52   Mr Virgel Paling Wo Contrast  04/10/2015  CLINICAL DATA:  82 year old diabetic hypertensive female with prior stroke presenting with near syncopal episode and slurred speech. Subsequent encounter. EXAM: MRI HEAD WITHOUT CONTRAST MRA HEAD WITHOUT CONTRAST TECHNIQUE: Multiplanar, multiecho pulse sequences of the brain and surrounding structures were obtained without intravenous contrast. Angiographic images of the head were obtained using MRA technique without contrast. COMPARISON:  04/10/2015 CT.  02/04/2012 MR and MR angiogram. FINDINGS: MRI HEAD FINDINGS No acute infarct or intracranial hemorrhage. Moderate global atrophy without hydrocephalus. Mild to moderate chronic small vessel disease changes. No intracranial mass lesion noted on this unenhanced exam. Partial opacification left mastoid air cells without obstructing lesion of eustachian tube. Post lens replacement otherwise orbital structures unremarkable. Cervical spondylotic changes with spinal stenosis and cord flattening most notable C2-3 followed by the C3-4 and C4-5 level. MRA HEAD FINDINGS Anterior circulation without medium or large size vessel significant stenosis or occlusion. Moderate moderate narrowing of portions of the  middle cerebral artery branches and A2  segment anterior cerebral artery bilaterally. No high-grade stenosis distal vertebral arteries or basilar artery. Moderate posterior circulation branch vessel narrowing and irregularity including high-grade stenosis proximal and distal aspect of the posterior cerebral arteries more notable on the right. No aneurysm noted. IMPRESSION: MRI HEAD No acute infarct or intracranial hemorrhage. Moderate global atrophy without hydrocephalus. Mild to moderate chronic small vessel disease changes. No intracranial mass lesion noted on this unenhanced exam. Partial opacification left mastoid air cells. Cervical spondylotic changes with spinal stenosis and cord flattening most notable C2-3 followed by the C3-4 and C4-5 level. MRA HEAD Anterior circulation without medium or large size vessel significant stenosis or occlusion. Moderate moderate narrowing of portions of the middle cerebral artery branches and A2 segment anterior cerebral artery bilaterally. No high-grade stenosis distal vertebral arteries or basilar artery. Moderate posterior circulation branch vessel narrowing and irregularity. High-grade stenosis proximal and distal aspect of the posterior cerebral arteries more notable on the right. Electronically Signed   By: Genia Del M.D.   On: 04/10/2015 14:52   Mr Brain Wo Contrast  04/10/2015  CLINICAL DATA:  82 year old diabetic hypertensive female with prior stroke presenting with near syncopal episode and slurred speech. Subsequent encounter. EXAM: MRI HEAD WITHOUT CONTRAST MRA HEAD WITHOUT CONTRAST TECHNIQUE: Multiplanar, multiecho pulse sequences of the brain and surrounding structures were obtained without intravenous contrast. Angiographic images of the head were obtained using MRA technique without contrast. COMPARISON:  04/10/2015 CT.  02/04/2012 MR and MR angiogram. FINDINGS: MRI HEAD FINDINGS No acute infarct or intracranial hemorrhage. Moderate global atrophy without  hydrocephalus. Mild to moderate chronic small vessel disease changes. No intracranial mass lesion noted on this unenhanced exam. Partial opacification left mastoid air cells without obstructing lesion of eustachian tube. Post lens replacement otherwise orbital structures unremarkable. Cervical spondylotic changes with spinal stenosis and cord flattening most notable C2-3 followed by the C3-4 and C4-5 level. MRA HEAD FINDINGS Anterior circulation without medium or large size vessel significant stenosis or occlusion. Moderate moderate narrowing of portions of the middle cerebral artery branches and A2 segment anterior cerebral artery bilaterally. No high-grade stenosis distal vertebral arteries or basilar artery. Moderate posterior circulation branch vessel narrowing and irregularity including high-grade stenosis proximal and distal aspect of the posterior cerebral arteries more notable on the right. No aneurysm noted. IMPRESSION: MRI HEAD No acute infarct or intracranial hemorrhage. Moderate global atrophy without hydrocephalus. Mild to moderate chronic small vessel disease changes. No intracranial mass lesion noted on this unenhanced exam. Partial opacification left mastoid air cells. Cervical spondylotic changes with spinal stenosis and cord flattening most notable C2-3 followed by the C3-4 and C4-5 level. MRA HEAD Anterior circulation without medium or large size vessel significant stenosis or occlusion. Moderate moderate narrowing of portions of the middle cerebral artery branches and A2 segment anterior cerebral artery bilaterally. No high-grade stenosis distal vertebral arteries or basilar artery. Moderate posterior circulation branch vessel narrowing and irregularity. High-grade stenosis proximal and distal aspect of the posterior cerebral arteries more notable on the right. Electronically Signed   By: Genia Del M.D.   On: 04/10/2015 14:52   Ir Fluoro Guide Cv Line Right  04/29/2015  INDICATION:  82 year old female with a history of foot infection and requirement of antibiotics. EXAM: PICC LINE PLACEMENT WITH ULTRASOUND AND FLUOROSCOPIC GUIDANCE MEDICATIONS: None ANESTHESIA/SEDATION: None FLUOROSCOPY TIME:  Fluoroscopy Time: 0 minutes 12 seconds (1 mGy). COMPLICATIONS: None PROCEDURE: The patient was advised of the possible risks and complications and agreed to undergo the procedure. The patient was  then brought to the angiographic suite for the procedure. The right arm was prepped with chlorhexidine, draped in the usual sterile fashion using maximum barrier technique (cap and mask, sterile gown, sterile gloves, large sterile sheet, hand hygiene and cutaneous antisepsis) and infiltrated locally with 1% Lidocaine. Ultrasound demonstrated patency of the right basilic vein, and this was documented with an image. Under real-time ultrasound guidance, this vein was accessed with a 21 gauge micropuncture needle and image documentation was performed. A 0.018 wire was introduced in to the vein. Over this, a 5 [place] single lumen power injectable PICC was advanced to the superior SVC/right atrial junction. Fluoroscopy during the procedure and fluoro spot radiograph confirms appropriate catheter position. The catheter was flushed and covered with asterile dressing. Catheter length: 40 cm IMPRESSION: Status post right basilic vein PICC placement. 40 cm single-lumen power injectable PICC placed. Catheter ready for use. Signed, Dulcy Fanny. Earleen Newport, DO [facility] Radiology Electronically Signed   By: Corrie Mckusick D.O.   On: 04/29/2015 15:05   Ir US Guide Vasc Access Right  04/29/2015  INDICATION: 82 year old female with a history of foot infection and requirement of antibiotics. EXAM: PICC LINE PLACEMENT WITH ULTRASOUND AND FLUOROSCOPIC GUIDANCE MEDICATIONS: None ANESTHESIA/SEDATION: None FLUOROSCOPY TIME:  Fluoroscopy Time: 0 minutes 12 seconds (1 mGy). COMPLICATIONS: None  PROCEDURE: The patient was advised of the possible risks and complications and agreed to undergo the procedure. The patient was then brought to the angiographic suite for the procedure. The right arm was prepped with chlorhexidine, draped in the usual sterile fashion using maximum barrier technique (cap and mask, sterile gown, sterile gloves, large sterile sheet, hand hygiene and cutaneous antisepsis) and infiltrated locally with 1% Lidocaine. Ultrasound demonstrated patency of the right basilic vein, and this was documented with an image. Under real-time ultrasound guidance, this vein was accessed with a 21 gauge micropuncture needle and image documentation was performed. A 0.018 wire was introduced in to the vein. Over this, a 5 [place] single lumen power injectable PICC was advanced to the superior SVC/right atrial junction. Fluoroscopy during the procedure and fluoro spot radiograph confirms appropriate catheter position. The catheter was flushed and covered with asterile dressing. Catheter length: 40 cm IMPRESSION: Status post right basilic vein PICC placement. 40 cm single-lumen power injectable PICC placed. Catheter ready for use. Signed, Dulcy Fanny. Earleen Newport, DO [facility] Radiology Electronically Signed   By: Corrie Mckusick D.O.   On: 04/29/2015 15:05    CBC  Recent Labs Lab 05/05/15 0422 05/08/15 0420 05/10/15 0855  WBC 9.7 14.0* 11.4*  HGB 10.6* 11.3* 10.7*  HCT 32.2* 35.1* 31.9*  PLT 356 392 361  MCV 81.3 83.4 79.9  MCH 26.8 26.8 26.8  MCHC 32.9 32.2 33.5  RDW 14.6 15.3 14.8    Chemistries   Recent Labs Lab 05/05/15 0422 05/08/15 0420 05/10/15 0855  NA 137 147* 138  K 4.5 4.4 3.5  CL 108 114* 107  CO2 23 21* 21*  GLUCOSE 212* 278* 259*  BUN 32* 35* 19  CREATININE 1.21* 1.54* 1.06*  CALCIUM 8.3* 8.7* 8.1*    ------------------------------------------------------------------------------------------------------------------ estimated creatinine clearance is 33.4 mL/min (by C-G formula based on Cr of 1.06). ------------------------------------------------------------------------------------------------------------------ No results for input(s): HGBA1C in the last 72 hours. ------------------------------------------------------------------------------------------------------------------ No results for input(s): CHOL, HDL, LDLCALC, TRIG, CHOLHDL, LDLDIRECT in the last 72 hours. ------------------------------------------------------------------------------------------------------------------ No results for input(s): TSH, T4TOTAL, T3FREE, THYROIDAB in the last 72 hours.  Invalid input(s): FREET3 ------------------------------------------------------------------------------------------------------------------  No results for input(s): VITAMINB12, FOLATE, FERRITIN, TIBC, IRON, RETICCTPCT in the last 72 hours.  Coagulation profile No results for input(s): INR, PROTIME in the last 168 hours.  No results for input(s): DDIMER in the last 72 hours.  Cardiac Enzymes No results for input(s): CKMB, TROPONINI, MYOGLOBIN in the last 168 hours.  Invalid input(s): CK ------------------------------------------------------------------------------------------------------------------ Invalid input(s): POCBNP    Dajai Wahlert D.O. on 05/10/2015 at 2:14 PM  Between 7am to 7pm - Pager - [phone]  After 7pm go to [email or web address] - password [email or web address]  And look for the night coverage person covering for me after hours  [facility] Office  [phone]

## 2015-05-10 NOTE — Progress Notes (Signed)
Daily Progress Note   Patient Name: RUCHEL BRANDENBURGER       Date: 05/10/2015 DOB: Jan 25, 1920  Age: 82 y.o. MRN#: [id number] Attending Physician: Cristal Ford, DO Primary Care Physician: Jani Gravel, MD Admit Date: 05/03/2015  Reason for Consultation/Follow-up: Establishing goals of care  Subjective: I met today with Ms. Kaminski and her daughter, [name].  Ms. Elizondo remains confused, but she remains more interactive and is able to participate in some basic conversation today. She denies any concerns or complaints.  I discussed with her daughter that her mother's overall nutrition, functional status, and cognition is poor and this may be a small improvement before continued decline. Her daughter appears to appreciate severity of the situation, but she is also hopeful that her mother will improve enough to return to [facility] where she had been living.  Length of Stay: 7 days  Current Medications: Scheduled Meds:  . aspirin  150 mg Rectal Daily  . enoxaparin (LOVENOX) injection  30 mg Subcutaneous Q24H  . haloperidol lactate  0.5 mg Intravenous BID  . heparin flush  50 Units Intravenous Q12H  . insulin aspart  0-9 Units Subcutaneous TID WC  . insulin glargine  9 Units Subcutaneous Daily  . levothyroxine  75 mcg Intravenous QAC breakfast  . metoprolol  5 mg Intravenous 3 times per day  . saccharomyces boulardii  250 mg Oral BID  . sodium chloride  2,500 mL Intravenous Once  . sodium chloride flush  3 mL Intravenous Q12H    Continuous Infusions: . sodium chloride 75 mL/hr at 05/10/15 0548    PRN Meds: acetaminophen **OR** acetaminophen, hydrALAZINE, ondansetron (ZOFRAN) IV, sodium chloride flush  Physical Exam: Physical Exam             Weak, elderly lady, resting in bed S1  S2 Shallow clear anteriorly Abdomen soft No edema, s/p tm amputn Opens eyes, Only repeats "no" repeatedly today.   Vital Signs: BP 176/63 mmHg  Pulse 81  Temp(Src) 98 F (36.7 C) (Axillary)  Resp 18  Ht '5\' 8"'$  (1.727 m)  Wt 71.668 kg (158 lb)  BMI 24.03 kg/m2  SpO2 98% SpO2: SpO2: 98 % O2 Device: O2 Device: Not Delivered O2 Flow Rate:    Intake/output summary:   Intake/Output Summary (Last 24 hours) at 05/10/15 1204 Last data filed  at 05/10/15 0500  Gross per 24 hour  Intake   4845 ml  Output      0 ml  Net   4845 ml   LBM: Last BM Date: 05/09/15 Baseline Weight: Weight: 29.7 kg (65 lb 7.6 oz) (See Progress Note.) Most recent weight: Weight: 71.668 kg (158 lb)       Palliative Assessment/Data: Flowsheet Rows        Most Recent Value   Intake Tab    Referral Department  Hospitalist   Unit at Time of Referral  Med/Surg Unit   Palliative Care Primary Diagnosis  Neurology   Date Notified  05/03/15   Palliative Care Type  Return patient Palliative Care   Reason for referral  Clarify Goals of Care   Date of Admission  05/03/15   Date first seen by Palliative Care  05/04/15   # of days Palliative referral response time  1 Day(s)   # of days IP prior to Palliative referral  0   Clinical Assessment    Palliative Performance Scale Score  30%   Pain Max last 24 hours  3   Pain Min Last 24 hours  2   Dyspnea Max Last 24 Hours  2   Dyspnea Min Last 24 hours  1   Psychosocial & Spiritual Assessment    Palliative Care Outcomes    Patient/Family meeting held?  No   Who was at the meeting?  patient, daughter over the phone.    Palliative Care follow-up planned  Yes, Facility   Other Treatment Preference Instructions  needs palliative care at SNF rehab on D/C       Additional Data Reviewed: CBC    Component Value Date/Time   WBC 11.4* 05/10/2015 0855   WBC 9.5 04/29/2015   RBC 3.99 05/10/2015 0855   RBC 3.45* 06/07/2008 1040   HGB 10.7* 05/10/2015 0855   HCT 31.9*  05/10/2015 0855   PLT 361 05/10/2015 0855   MCV 79.9 05/10/2015 0855   MCH 26.8 05/10/2015 0855   MCHC 33.5 05/10/2015 0855   RDW 14.8 05/10/2015 0855   LYMPHSABS 1.8 05/03/2015 0139   MONOABS 1.1* 05/03/2015 0139   EOSABS 0.3 05/03/2015 0139   BASOSABS 0.1 05/03/2015 0139    CMP     Component Value Date/Time   NA 138 05/10/2015 0855   NA 141 04/29/2015   K 3.5 05/10/2015 0855   CL 107 05/10/2015 0855   CO2 21* 05/10/2015 0855   GLUCOSE 259* 05/10/2015 0855   BUN 19 05/10/2015 0855   BUN 45* 04/29/2015   CREATININE 1.06* 05/10/2015 0855   CREATININE 1.8* 04/29/2015   CALCIUM 8.1* 05/10/2015 0855   PROT 6.1* 05/03/2015 0553   ALBUMIN 2.8* 05/03/2015 0553   AST 12* 05/03/2015 0553   ALT 12* 05/03/2015 0553   ALKPHOS 67 05/03/2015 0553   BILITOT 0.3 05/03/2015 0553   GFRNONAA 44* 05/10/2015 0855   GFRAA 51* 05/10/2015 0855       Problem List:  Patient Active Problem List   Diagnosis Date Noted  . Palliative care encounter   . Acute delirium   . Diarrhea   . Encounter for palliative care   . Goals of care, counseling/discussion   . Urinary tract infection, site not specified   . Chronic atrial fibrillation ([place])   . UTI (lower urinary tract infection) 05/03/2015  . Sepsis ([place]) 05/03/2015  . Acute encephalopathy 05/03/2015  . Hypothermia 05/03/2015  . Hypoglycemia   . TIA (transient  ischemic attack)   . Type 2 diabetes mellitus with circulatory disorder ([place])   . Dementia   . CVA (cerebral infarction) 04/10/2015  . S/P transmetatarsal amputation of right foot ([place]) 04/10/2015  . History of CVA w/ right side deficits 04/05/2015  . Chronic osteomyelitis ([place]) 04/05/2015  . Diabetes mellitus with renal manifestations, uncontrolled ([place]) 10/21/2014  . Chronic ulcer of left foot ([place]) 10/21/2014  . Chronic diastolic CHF (congestive heart failure) ([place]) 10/21/2014  . Diabetic neuropathy ([place]) 08/09/2014  . Coronary atherosclerosis of native coronary artery  06/07/2012  . Chronic kidney disease (CKD), stage IV (severe) ([place]) 04/12/2012  . Hypothyroidism 04/21/2011  . HYPERTENSION, BENIGN ESSENTIAL 12/08/2006     Palliative Care Assessment & Plan    1.Code Status:  DNR    Code Status Orders        Start     Ordered   05/04/15 1441  Do not attempt resuscitation (DNR)   Continuous    Question Answer Comment  In the event of cardiac or respiratory ARREST Do not call a "code blue"   In the event of cardiac or respiratory ARREST Do not perform Intubation, CPR, defibrillation or ACLS   In the event of cardiac or respiratory ARREST Use medication by any route, position, wound care, and other measures to relive pain and suffering. May use oxygen, suction and manual treatment of airway obstruction as needed for comfort.      05/04/15 1440    Code Status History    Date Active Date Inactive Code Status Order ID Comments User Context   05/03/2015  4:53 AM 05/04/2015  2:40 PM Full Code [id number]  Ivor Costa, MD ED   04/10/2015  6:53 PM 04/12/2015 11:07 PM Full Code [id number]  Samella Parr, NP Inpatient   04/04/2015  9:56 PM 04/08/2015  6:23 PM Full Code [id number]  Norval Morton, MD ED   10/22/2014 11:27 AM 10/25/2014  8:06 PM DNR [id number]  Thurnell Lose, MD Inpatient   10/21/2014  5:31 PM 10/22/2014 11:27 AM Full Code [id number]  Robbie Lis, MD Inpatient   08/08/2014  5:20 PM 08/13/2014  6:08 PM Full Code [id number]  Theodis Blaze, MD Inpatient   11/18/2013 12:57 PM 11/21/2013  4:33 PM Full Code [id number]  Barton Dubois, MD Inpatient   08/28/2013 12:55 AM 08/30/2013  7:16 PM Full Code [id number]  Hosie Poisson, MD Inpatient   04/14/2012  7:40 PM 04/17/2012  5:41 PM Full Code [id number]  Newt Minion, MD Inpatient   04/12/2012 12:18 AM 04/14/2012  7:40 PM Full Code [id number]  Janell Quiet, MD Inpatient   04/21/2011  9:40 PM 04/27/2011  7:19 PM Full Code [id number]  Janae Bridgeman, RN Inpatient       2. Goals of Care/Additional Recommendations:  Limitations  on Scope of Treatment: DNR DNI, but no other limits on care, full scope of medical treatment such as fluids, antibiotics.   I'm concerned she has developed hypoactive delirium. Continue delirium protocol. Continue BID scheduled haldol.  She is currently in mittens.  I asked staff to remove.  I ordered additional haldol prn.  While this may be sedating, I think that she will best be served by avoiding mittens, redirecting as necessary, and using additional prn haldol only if needed.  Patient is more interactive and awake today.  She still is not taking in sufficient nutrition/hydration.  Her daughter remains hopeful that she will improve enough to return to [facility].  Plan to  complete abx and continue to assess daily.  Plan will either be to [facility] or residential hospice.   3. Symptom Management:      1. As above   4. Palliative Prophylaxis:   Delirium Protocol  Standard delirium management (adapted from NICE guidelines 2011 for prevention of delirium):  Provide continuity of care when possible (avoid frequent changing of surroundings and staff).  Frequent reorientation to time with:  A clock should always be visible.  Make sure Calendar/white board is updated.  Lights on/blinds open during the day and off/closed at night.  Encourage frequent family visits.  Monitor for and treat dehydration/constipation.  Optimize oxygen saturation.  Avoid catheters and IV's when possible and look for/treat infections.  Encourage early mobility.  Assess and treat pain.  Ensure adequate nutrition and functioning dentures.  Address reversible causes of hearing and visual impairment:  Use pocket talker if hearing aids are unavailable.  Avoid sleep disturbance (normalize sleep/wake cycle).  Minimize disturbances and consider NOT obtaining vitals at night if possible.  Review Medications to avoid polypharmacy and avoid deliriogenic medications when possible:  Benzodiazepines.  Dihydropyridines.   Antihistamines.  Anticholinergics.  (Possibly avoid: H2 blockers, tricyclic antidepressants, antiparkinson medications, steroids, NSAID's).   5. Prognosis: Unable to determine but prognosis guarded given advanced age and multiple acute and underlying co morbidities including compromised nutrition. I'm concerned this is irreversible decline and, while her mental status is improved today, she will continue to decompensate over the next few days/weeks.   6. Discharge Planning:  [place] for rehab with Palliative care service follow-up most likely   Care plan was discussed with  Patient, her daughter   Thank you for allowing the Palliative Medicine Team to assist in the care of this patient.   Time In:  1140 Time Out: 1205 Total Time 25 Prolonged Time Billed  no        Baring, MD  05/10/2015, 12:04 PM  Please contact Palliative Medicine Team phone at [phone] for questions and concerns.

## 2015-05-11 LAB — GLUCOSE, CAPILLARY
GLUCOSE-CAPILLARY: 205 mg/dL — AB (ref 65–99)
Glucose-Capillary: 129 mg/dL — ABNORMAL HIGH (ref 65–99)
Glucose-Capillary: 163 mg/dL — ABNORMAL HIGH (ref 65–99)
Glucose-Capillary: 166 mg/dL — ABNORMAL HIGH (ref 65–99)

## 2015-05-11 NOTE — Progress Notes (Signed)
[facility]                                                                              Patient Demographics  [name], is a 82 y.o. female, DOB - [birth year], [id number]  Admit date - 05/03/2015   Admitting Physician Ivor Costa, MD  Outpatient Primary MD for the patient is Jani Gravel, MD  LOS - 8   Chief Complaint  Patient presents with  . Hypoglycemia  . Altered Mental Status      HPI on 05/03/2015 by Dr. Elsie Lincoln is a 82 y.o. female with PMH of hypothyroidism, diastolic congestive heart failure, atrial flutter not on anticoagulants due to old age, dementia and high risk of fall, stroke, chronic kidney disease-stage IV, BPH, recent right transmetatarsal amputation due to chronic osteomyelitis, who presents with altered mental status and hypoglycemia.  Patient was recently hospitalized from 3/16 to 3.18 due to stroke. She also had right transmetatarsal amputation due to chronic osteomyelitis in that admission. She was discharged to nursing home for rehabilitation. She is getting Tigecycline by IV in SNF. Per report, pt was found to have AMS and hypoglycemia with blood sugar 45 in her nursing home. When I saw pt in ED, pt is not oriented x 3, could not provide any history. She moves extremities upon painful stimulus. She dose not seem to have cough, nausea, vomiting or diarrhea.  In ED, patient was found to have WBC 19.7, CBG 146 on arrival, positive urinalysis with large amount of leukocytes, hypothermia with temperature 94.7, oxygen desaturation to 86%, slightly bradycardia, stable renal function. Chest x-ray is negative for acute abnormalities. CT head is negative for acute intracranial abnormalities. Patient is admitted to inpatient for further intervention treatment.  Interim history Completed antibiotics for UTI.  Mental status improved transiently.  Currently not responsive or interactive. Palliative care consulted.  Patient will likely need SNF with  palliative care or residential hospice.   Assessment & Plan   Sepsis secondary to UTI -Patient has positive urinalysis with large amount of leukocyte, consistent with UTI.  -Upon admission, patient had leukocytosis, hypothermia and encephalopathy.  -WBC normalized, VSS stable -Patient was on Tygacil and as well as fluconazole -Infectious disease recommended discontinuing antibiotics and starting her on Primaxin -main problem and barrier for discharge is delirium and inability for meaningful PO intake/medications -Spoke with Dr. Domingo Cocking, palliative care, unsure of patient's true baseline.  Not sure that patient will recover from this.   -Primaxin completed on 05/10/2015. -Patient less interactive today.  Will likely need discharge to hospice. (palliative care also following and appreciated)  Acute encephalopathy -Most likely due to UTI and sepsis. -Treating UTI as above -Follow clinical response- not interactive today   Hx of stroke/TIA -continue ASA -recent admission for TIA -completed work up approx 3 weeks ago -discussed with neurology and will monitor for now -patient is back to baseline -will consider using plavix and low dose ASA when able to take PO  Hypertension -continue IV metoprolol with holding parameter  History of Afib/flutter:  -CHA2DS2-VASc Score is 6, needs oral anticoagulation, but patient is not on [facility] due to old age, dementia and high risk of fall.  -  HR is well controled. -on IV metoprolol -ASA for prevention  Dysphagia -Speech recommending full liquid diet -very high risk for aspiration  Hypothyroidism -TSH WNL -Continue IV synthroid, given dysphagia  Diabetes Mellitus, type II -Last A1c 11.9 on 04/11/15, poorly controlled.  -Patient is taking Lantus at home -Continue ISS. Blood sugars elevated, will restart lantus 9u daily -patient still not eating much and a high risk for hypoglycemia   S/P transmetatarsal amputation of right foot  -due to  chronic osteomyelitis -will discontinue IV Tigecycline as recommended by ID -consult wound care for dressing instructions and care  Chronic kidney disease (CKD), stage IV (severe)  -Creatinine is 1.4 on admission. Currently 1.06 -Continue to monitor [facility]  Hospital acquired delirium -Continue seroquel, haldol  Code Status: DNR   Family Communication: None at bedside  Disposition Plan: Admitted.  Continue to monitor patient. Patient will like need hospice.  Time Spent in minutes   30 minutes  Procedures  None  Consults   Neurology curbside (during acute event of TIA) Palliative Care ID (Dr. Linus Salmons; Will d/c tigicycline and fluconazole; complete 5 days total of primaxin.)  DVT Prophylaxis  Lovenox  Lab Results  Component Value Date   PLT 361 05/10/2015    Medications  Scheduled Meds: . aspirin  150 mg Rectal Daily  . enoxaparin (LOVENOX) injection  40 mg Subcutaneous Q24H  . haloperidol lactate  0.5 mg Intravenous BID  . heparin flush  50 Units Intravenous Q12H  . insulin aspart  0-9 Units Subcutaneous TID WC  . insulin glargine  9 Units Subcutaneous Daily  . levothyroxine  75 mcg Intravenous QAC breakfast  . metoprolol  5 mg Intravenous 3 times per day  . saccharomyces boulardii  250 mg Oral BID  . sodium chloride  2,500 mL Intravenous Once  . sodium chloride flush  3 mL Intravenous Q12H   Continuous Infusions: . sodium chloride 75 mL/hr at 05/10/15 2036   PRN Meds:.acetaminophen **OR** acetaminophen, haloperidol lactate, hydrALAZINE, ondansetron (ZOFRAN) IV, sodium chloride flush  Antibiotics    Anti-infectives    Start     Dose/Rate Route Frequency Ordered Stop   05/05/15 1800  imipenem-cilastatin (PRIMAXIN) 250 mg in sodium chloride 0.9 % 100 mL IVPB     250 mg 200 mL/hr over 30 Minutes Intravenous Every 6 hours 05/05/15 1731 05/10/15 1204   05/04/15 2359  imipenem-cilastatin (PRIMAXIN) 250 mg in sodium chloride 0.9 % 100 mL IVPB  Status:  Discontinued       250 mg 200 mL/hr over 30 Minutes Intravenous Every 12 hours 05/03/15 1323 05/04/15 0334   05/04/15 2200  fluconazole (DIFLUCAN) IVPB 100 mg  Status:  Discontinued     100 mg 50 mL/hr over 60 Minutes Intravenous Every 24 hours 05/04/15 2131 05/05/15 1707   05/04/15 0400  imipenem-cilastatin (PRIMAXIN) 250 mg in sodium chloride 0.9 % 100 mL IVPB  Status:  Discontinued     250 mg 200 mL/hr over 30 Minutes Intravenous Every 6 hours 05/04/15 0334 05/04/15 2121   05/03/15 1200  imipenem-cilastatin (PRIMAXIN) 250 mg in sodium chloride 0.9 % 100 mL IVPB  Status:  Discontinued     250 mg 200 mL/hr over 30 Minutes Intravenous 4 times per day 05/03/15 0502 05/03/15 1323   05/03/15 1000  tigecycline (TYGACIL) injection 50 mg  Status:  Discontinued     50 mg Intravenous Every 12 hours 05/03/15 0450 05/03/15 0454   05/03/15 1000  tigecycline (TYGACIL) 50 mg in sodium chloride 0.9 % 100  mL IVPB  Status:  Discontinued     50 mg 200 mL/hr over 30 Minutes Intravenous Every 12 hours 05/03/15 0455 05/05/15 1707   05/03/15 0515  imipenem-cilastatin (PRIMAXIN) 250 mg in sodium chloride 0.9 % 100 mL IVPB     250 mg 200 mL/hr over 30 Minutes Intravenous STAT 05/03/15 0501 05/03/15 0635   05/03/15 0430  vancomycin (VANCOCIN) IVPB 1000 mg/200 mL premix  Status:  Discontinued     1,000 mg 200 mL/hr over 60 Minutes Intravenous  Once 05/03/15 0416 05/03/15 0450   05/03/15 0430  piperacillin-tazobactam (ZOSYN) IVPB 3.375 g  Status:  Discontinued     3.375 g 100 mL/hr over 30 Minutes Intravenous  Once 05/03/15 0416 05/03/15 0436     Subjective:   [name] seen and examined today.  Not interactive today.  Awake, but not verbally responsive.   Objective:   Filed Vitals:   05/10/15 1448 05/10/15 2111 05/11/15 0413 05/11/15 0418  BP: 185/64 199/65 164/60 138/58  Pulse: 88 75 76 77  Temp: 97.7 F (36.5 C) 98 F (36.7 C) 98.5 F (36.9 C)   TempSrc: Oral Axillary Axillary   Resp: 18 19 19    Height:       Weight:      SpO2: 96% 100% 100%     Wt Readings from Last 3 Encounters:  05/04/15 71.668 kg (158 lb)  04/30/15 74.844 kg (165 lb)  04/28/15 74.844 kg (165 lb)     Intake/Output Summary (Last 24 hours) at 05/11/15 1244 Last data filed at 05/10/15 1300  Gross per 24 hour  Intake     60 ml  Output      0 ml  Net     60 ml    Exam  General: Well developed, Elderly, NAD  HEENT: NCAT, mucous membranes moist.   Cardiovascular: S1 S2 auscultated, regular  Respiratory: Clear to auscultation   Abdomen: Soft, nontender, nondistended, + bowel sounds  Extremities: warm dry without cyanosis clubbing or edema. RLE wrapped.    Data Review   Micro Results Recent Results (from the past 240 hour(s))  Urine culture     Status: Abnormal   Collection Time: 05/03/15  2:55 AM  Result Value Ref Range Status   Specimen Description URINE, CATHETERIZED  Final   Special Requests Normal  Final   Culture >=100,000 COLONIES/mL YEAST (A)  Final   Report Status 05/04/2015 FINAL  Final  Blood culture (routine x 2)     Status: None   Collection Time: 05/03/15  4:03 AM  Result Value Ref Range Status   Specimen Description BLOOD RIGHT HAND  Final   Special Requests BOTTLES DRAWN AEROBIC AND ANAEROBIC 5CC  Final   Culture   Final    NO GROWTH 5 DAYS Performed at [facility]    Report Status 05/08/2015 FINAL  Final  Blood culture (routine x 2)     Status: None   Collection Time: 05/03/15  4:09 AM  Result Value Ref Range Status   Specimen Description BLOOD LEFT HAND  Final   Special Requests BOTTLES DRAWN AEROBIC AND ANAEROBIC 5CC  Final   Culture   Final    NO GROWTH 5 DAYS Performed at [facility]    Report Status 05/08/2015 FINAL  Final  C difficile quick scan w PCR reflex     Status: None   Collection Time: 05/06/15  2:51 AM  Result Value Ref Range Status   C Diff antigen NEGATIVE NEGATIVE  Final   C Diff toxin NEGATIVE NEGATIVE Final   C Diff interpretation Negative  for toxigenic C. difficile  Final    Radiology Reports Dg Chest 2 View  05/03/2015  CLINICAL DATA:  Hypoglycemia.  Cough and congestion. EXAM: CHEST  2 VIEW COMPARISON:  04/10/2015 FINDINGS: There is a right upper extremity PICC line extending into the low SVC. There is mild vascular and interstitial prominence which is unchanged. No consolidation. No effusions. Unchanged mild cardiomegaly. IMPRESSION: Unchanged mild cardiomegaly and vascular/interstitial prominence. No consolidation or effusion. Electronically Signed   By: Andreas Newport M.D.   On: 05/03/2015 01:51   Ct Head Wo Contrast  05/04/2015  CLINICAL DATA:  Sudden onset of altered mental status approximately 45 minutes ago. EXAM: CT HEAD WITHOUT CONTRAST TECHNIQUE: Contiguous axial images were obtained from the base of the skull through the vertex without intravenous contrast. COMPARISON:  05/03/2015 FINDINGS: The ventricles are normal in configuration. There is ventricular and sulcal enlargement reflecting moderate atrophy. No hydrocephalus. There are no parenchymal masses or mass effect. There is no evidence of a recent cortical infarct. Patchy white matter hypoattenuation is noted, stable from the prior exam, consistent with mild chronic microvascular ischemic change. There are no extra-axial masses or abnormal fluid collections. There is no intracranial hemorrhage. The visualized sinuses are clear. IMPRESSION: 1. No acute intracranial abnormalities. No evidence of a recent cortical infarct or of intracranial hemorrhage. 2. Atrophy and mild chronic microvascular ischemic change. These results were called by telephone at the time of interpretation on 05/04/2015 at 11:24 am to nurse Alease Medina, who verbally acknowledged these results. Electronically Signed   By: Lajean Manes M.D.   On: 05/04/2015 11:25   Ct Head Wo Contrast  05/03/2015  CLINICAL DATA:  Hypoglycemia.  Altered mental status. EXAM: CT HEAD WITHOUT CONTRAST TECHNIQUE: Contiguous  axial images were obtained from the base of the skull through the vertex without intravenous contrast. COMPARISON:  04/10/2015 FINDINGS: There is no intracranial hemorrhage, mass or evidence of acute infarction. There is no extra-axial fluid collection. There is moderate generalized atrophy. There is white matter hypodensity, chronic and likely due to small vessel disease. No acute intracranial findings are evident. No interval change is evident. No bony abnormalities evident. The visible paranasal sinuses are clear. IMPRESSION: Generalized atrophy and chronic white matter hypodensities, likely small vessel disease. No acute intracranial finding. Electronically Signed   By: Andreas Newport M.D.   On: 05/03/2015 01:52   Ir Fluoro Guide Cv Line Right  04/29/2015  INDICATION: 82 year old female with a history of foot infection and requirement of antibiotics. EXAM: PICC LINE PLACEMENT WITH ULTRASOUND AND FLUOROSCOPIC GUIDANCE MEDICATIONS: None ANESTHESIA/SEDATION: None FLUOROSCOPY TIME:  Fluoroscopy Time: 0 minutes 12 seconds (1 mGy). COMPLICATIONS: None PROCEDURE: The patient was advised of the possible risks and complications and agreed to undergo the procedure. The patient was then brought to the angiographic suite for the procedure. The right arm was prepped with chlorhexidine, draped in the usual sterile fashion using maximum barrier technique (cap and mask, sterile gown, sterile gloves, large sterile sheet, hand hygiene and cutaneous antisepsis) and infiltrated locally with 1% Lidocaine. Ultrasound demonstrated patency of the right basilic vein, and this was documented with an image. Under real-time ultrasound guidance, this vein was accessed with a 21 gauge micropuncture needle and image documentation was performed. A 0.018 wire was introduced in to the vein. Over this, a 5 [place] single lumen power injectable PICC was advanced to the superior SVC/right atrial junction. Fluoroscopy  during the procedure and  fluoro spot radiograph confirms appropriate catheter position. The catheter was flushed and covered with asterile dressing. Catheter length: 40 cm IMPRESSION: Status post right basilic vein PICC placement. 40 cm single-lumen power injectable PICC placed. Catheter ready for use. Signed, Dulcy Fanny. Earleen Newport, DO [facility] Radiology Electronically Signed   By: Corrie Mckusick D.O.   On: 04/29/2015 15:05   Ir US Guide Vasc Access Right  04/29/2015  INDICATION: 82 year old female with a history of foot infection and requirement of antibiotics. EXAM: PICC LINE PLACEMENT WITH ULTRASOUND AND FLUOROSCOPIC GUIDANCE MEDICATIONS: None ANESTHESIA/SEDATION: None FLUOROSCOPY TIME:  Fluoroscopy Time: 0 minutes 12 seconds (1 mGy). COMPLICATIONS: None PROCEDURE: The patient was advised of the possible risks and complications and agreed to undergo the procedure. The patient was then brought to the angiographic suite for the procedure. The right arm was prepped with chlorhexidine, draped in the usual sterile fashion using maximum barrier technique (cap and mask, sterile gown, sterile gloves, large sterile sheet, hand hygiene and cutaneous antisepsis) and infiltrated locally with 1% Lidocaine. Ultrasound demonstrated patency of the right basilic vein, and this was documented with an image. Under real-time ultrasound guidance, this vein was accessed with a 21 gauge micropuncture needle and image documentation was performed. A 0.018 wire was introduced in to the vein. Over this, a 5 [place] single lumen power injectable PICC was advanced to the superior SVC/right atrial junction. Fluoroscopy during the procedure and fluoro spot radiograph confirms appropriate catheter position. The catheter was flushed and covered with asterile dressing. Catheter length: 40 cm IMPRESSION: Status post right basilic vein PICC placement. 40 cm single-lumen power injectable PICC placed. Catheter ready for use.  Signed, Dulcy Fanny. Earleen Newport, DO [facility] Radiology Electronically Signed   By: Corrie Mckusick D.O.   On: 04/29/2015 15:05    CBC  Recent Labs Lab 05/05/15 0422 05/08/15 0420 05/10/15 0855  WBC 9.7 14.0* 11.4*  HGB 10.6* 11.3* 10.7*  HCT 32.2* 35.1* 31.9*  PLT 356 392 361  MCV 81.3 83.4 79.9  MCH 26.8 26.8 26.8  MCHC 32.9 32.2 33.5  RDW 14.6 15.3 14.8    Chemistries   Recent Labs Lab 05/05/15 0422 05/08/15 0420 05/10/15 0855  NA 137 147* 138  K 4.5 4.4 3.5  CL 108 114* 107  CO2 23 21* 21*  GLUCOSE 212* 278* 259*  BUN 32* 35* 19  CREATININE 1.21* 1.54* 1.06*  CALCIUM 8.3* 8.7* 8.1*   ------------------------------------------------------------------------------------------------------------------ estimated creatinine clearance is 33.4 mL/min (by C-G formula based on Cr of 1.06). ------------------------------------------------------------------------------------------------------------------ No results for input(s): HGBA1C in the last 72 hours. ------------------------------------------------------------------------------------------------------------------ No results for input(s): CHOL, HDL, LDLCALC, TRIG, CHOLHDL, LDLDIRECT in the last 72 hours. ------------------------------------------------------------------------------------------------------------------ No results for input(s): TSH, T4TOTAL, T3FREE, THYROIDAB in the last 72 hours.  Invalid input(s): FREET3 ------------------------------------------------------------------------------------------------------------------ No results for input(s): VITAMINB12, FOLATE, FERRITIN, TIBC, IRON, RETICCTPCT in the last 72 hours.  Coagulation profile No results for input(s): INR, PROTIME in the last 168 hours.  No results for input(s): DDIMER in the last 72 hours.  Cardiac Enzymes No results for input(s): CKMB, TROPONINI, MYOGLOBIN in the last 168 hours.  Invalid  input(s): CK ------------------------------------------------------------------------------------------------------------------ Invalid input(s): POCBNP    Lanee Chain D.O. on 05/11/2015 at 12:44 PM  Between 7am to 7pm - Pager - [phone]  After 7pm go to [email or web address] - password [email or web address]  And look for the night coverage person covering for me after hours  [facility]  Group Office  [phone]

## 2015-05-11 NOTE — Progress Notes (Signed)
Daughter, [name], too patient's diamond engagement ring home today.

## 2015-05-12 DIAGNOSIS — N390 Urinary tract infection, site not specified: Secondary | ICD-10-CM

## 2015-05-12 DIAGNOSIS — R410 Disorientation, unspecified: Secondary | ICD-10-CM

## 2015-05-12 DIAGNOSIS — G9340 Encephalopathy, unspecified: Secondary | ICD-10-CM

## 2015-05-12 LAB — GLUCOSE, CAPILLARY
Glucose-Capillary: 180 mg/dL — ABNORMAL HIGH (ref 65–99)
Glucose-Capillary: 130 mg/dL — ABNORMAL HIGH (ref 65–99)

## 2015-05-12 MED ORDER — HALOPERIDOL LACTATE 2 MG/ML PO CONC: 0.6000 mg | ORAL | Status: AC | PRN

## 2015-05-12 MED ORDER — MORPHINE SULFATE (CONCENTRATE) 10 MG /0.5 ML PO SOLN: 5.0000 mg | Freq: Two times a day (BID) | ORAL | Status: AC | PRN

## 2015-05-12 MED ORDER — LORAZEPAM 2 MG/ML PO CONC: 1.0000 mg | ORAL | Status: AC | PRN

## 2015-05-12 NOTE — Progress Notes (Signed)
[id number] Davis,BSN,RN,CCM/spoke with the daughter of Ms. Shipp concerning options for home hospice and residential hospice.  Would like to use [facility] and Palliative care if needed/also is thinking about residential hospice as well.  Patient has been [place] daughter did go and gathering her things from this facility this am.  My name and number given to daughter for any questions and assistance.

## 2015-05-12 NOTE — Discharge Summary (Signed)
[name], is a 82 y.o. female  DOB 19-Apr-1919  MRN [id number].  Admission date:  05/03/2015  Admitting Physician  Ivor Costa, MD  Discharge Date:  05/12/2015   Primary MD  Jani Gravel, MD  Recommendations for primary care physician for things to follow:  - Further management per home hospice agency   Admission Diagnosis  Hypoglycemia [E16.2] UTI (lower urinary tract infection) [N39.0] Hypothermia, initial encounter [T68.XXXA]   Discharge Diagnosis  Hypoglycemia [E16.2] UTI (lower urinary tract infection) [N39.0] Hypothermia, initial encounter [T68.XXXA]    Principal Problem:   UTI (lower urinary tract infection) Active Problems:   HYPERTENSION, BENIGN ESSENTIAL   Hypothyroidism   Chronic kidney disease (CKD), stage IV (severe) (HCC)   Coronary atherosclerosis of native coronary artery   Diabetic neuropathy (HCC)   Diabetes mellitus with renal manifestations, uncontrolled (HCC)   Chronic diastolic CHF (congestive heart failure) (HCC)   History of CVA w/ right side deficits   S/P transmetatarsal amputation of right foot ([place])   Dementia   Sepsis ([place])   Acute encephalopathy   Hypothermia   Encounter for palliative care   Goals of care, counseling/discussion   Urinary tract infection, site not specified   Chronic atrial fibrillation ([place])   Diarrhea   Acute delirium   Palliative care encounter      Past Medical History  Diagnosis Date  . Myocardial infarction ([place])   . Coronary artery disease   . Hypertension   . Dysrhythmia   . Peripheral vascular disease ([place])   . Hypothyroidism   . Type 2 diabetes mellitus with diabetic polyneuropathy, with long-term current use of insulin ([place])   . Presbyesophagus 04/23/2011  . Urinary retention with incomplete bladder emptying 04/23/2011  . UTI (lower urinary tract infection) 04/21/2011  . Diastolic CHF, chronic (HCC)     Grade I diastolic  dysfunction on Echo 04/26/11  . Anaphylaxis 04/25/2011    Thought to be from Rocephin  . History of atrial flutter     ablation  . C. difficile colitis 04/17/2012  . Atrial flutter ([place]) 11/01/2012  . HCAP (healthcare-associated pneumonia) 10/21/2014  . Stroke [facility]) 2003    "lost the use of all of my right side"  . HOH (hard of hearing)   . CKD (chronic kidney disease), stage IV ([place])   . Expressive aphasia   . Hyperkalemia   . Hyponatremia   . Physical deconditioning   . Cellulitis of right foot   . Chronic osteomyelitis ([place])   . Chronic diastolic CHF (congestive heart failure) ([place])   . Neuropathy ([place])   . Acute blood loss anemia   . Cellulitis of right foot     Past Surgical History  Procedure Laterality Date  . Cholecystectomy    . Colonoscopy  01/15/2011    Procedure: COLONOSCOPY;  Surgeon: Beryle Beams, MD;  Location: [facility];  Service: Endoscopy;  Laterality: N/A;  . Amputation Right 04/14/2012    Procedure: AMPUTATION RAY;  Surgeon: Newt Minion, MD;  Location: [facility];  Service: Orthopedics;  Laterality: Right;  ankle block  . Cardiac electrophysiology mapping and ablation    . Nm myocar perf wall motion  01/23/2009    mild ischemia mid inferior & apical inferior.  EF 75%  . Amputation Right 08/10/2014    Procedure: AMPUTATION RAY;  Surgeon: Mcarthur Rossetti, MD;  Location: [facility] ORS;  Service: Orthopedics;  Laterality: Right;  ankle block  . Appendectomy    . Shoulder surgery Right     "put back in place'  . Eye surgery Bilateral     Cararact  . I&d extremity Right 12/03/2014    Procedure: IRRIGATION AND DEBRIDEMENT RIGHT FOOT;  Surgeon: Mcarthur Rossetti, MD;  Location: [facility];  Service: Orthopedics;  Laterality: Right;  . Amputation Right 04/06/2015    Procedure: Transmetatarsal AMPUTATION RIGHT FOOT;  Surgeon: Mcarthur Rossetti, MD;  Location: [facility];  Service: Orthopedics;  Laterality: Right;       History of present illness and  Hospital  Course:     Kindly see H&P for history of present illness and admission details, please review complete Labs, Consult reports and Test reports for all details in brief  HPI  from the history and physical done on the day of admission 05/03/2015   HPI: [name] is a 82 y.o. female with PMH of hypothyroidism, diastolic congestive heart failure, atrial flutter not on anticoagulants due to old age, dementia and high risk of fall, stroke, chronic kidney disease-stage IV, BPH, recent right transmetatarsal amputation due to chronic osteomyelitis, who presents with altered mental status and hypoglycemia.  Patient was recently hospitalized from 3/16 to 3.18 due to stroke. She also had right transmetatarsal amputation due to chronic osteomyelitis in that admission. She was discharged to nursing home for rehabilitation. She is getting Tigecycline by IV in SNF. Per report, pt was found to have AMS and hypoglycemia with blood sugar 45 in her nursing home. When I saw pt in ED, pt is not oriented x 3, could not provide any history. She moves extremities upon painful stimulus. She dose not seem to have cough, nausea, vomiting or diarrhea.  In ED, patient was found to have WBC 19.7, CBG 146 on arrival, positive urinalysis with large amount of leukocytes, hypothermia with temperature 94.7, oxygen desaturation to 86%, slightly bradycardia, stable renal function. Chest x-ray is negative for acute abnormalities. CT head is negative for acute intracranial abnormalities. Patient is admitted to inpatient for further intervention treatment.  Hospital Course  Completed antibiotics for UTI. Mental status with no significant improvement during hospital stay, patient is not interactive, not communicative, palliative care consulted, discussed with patient and daughter, that is for comfort care, can go home with home hospice.  Sepsis secondary to UTI -Patient has positive urinalysis with large amount of leukocyte, consistent  with UTI.  -Upon admission, patient had leukocytosis, hypothermia and encephalopathy.  -WBC normalized, VSS stable -Patient was on Tygacil and as well as fluconazole -Infectious disease recommended discontinuing antibiotics and starting her on Primaxin -main problem and barrier for discharge is delirium and inability for meaningful PO intake/medications -Spoke with Dr. Domingo Cocking, palliative care, unsure of patient's true baseline. Not sure that patient will recover from this.  -Primaxin completed on 05/10/2015.   Acute encephalopathy -Most likely due to UTI and sepsis. - Patient with no significant mental status improvement, palliative care consulted, discussed with patient's family, plan is for comfort care, to go home with home hospice.  Hx of stroke/TIA -No antiplatelet therapy  images comfort care  Hypertension -No medications  History of Afib/flutter:  -No medications   Dysphagia -Treated for comfort   Hypothyroidism  Diabetes Mellitus, type II   S/P transmetatarsal amputation of right foot   Chronic kidney disease (CKD), stage IV (severe)   Hospital acquired delirium    Discharge Condition:  Comfort care, hours today's   Follow UP   per home hospice agency   Discharge Instructions  and  Discharge Medications     Discharge Instructions    Discharge instructions    Complete by:  As directed   Further management as per home hospice agency - Feed  for comfort            Medication List    STOP taking these medications        acetaminophen 325 MG tablet  Commonly known as:  TYLENOL     aspirin EC 325 MG tablet     carvedilol 6.25 MG tablet  Commonly known as:  COREG     diltiazem 180 MG 24 hr capsule  Commonly known as:  CARDIZEM CD     gabapentin 300 MG capsule  Commonly known as:  NEURONTIN     heparin flush 10 UNIT/ML Soln injection     insulin aspart 100 UNIT/ML injection  Commonly known as:  novoLOG     insulin glargine  100 UNIT/ML injection  Commonly known as:  LANTUS     isosorbide mononitrate 30 MG 24 hr tablet  Commonly known as:  IMDUR     levothyroxine 175 MCG tablet  Commonly known as:  SYNTHROID, LEVOTHROID     multivitamin with minerals Tabs tablet     NORMAL SALINE FLUSH IV     PROCEL Powd     saccharomyces boulardii 250 MG capsule  Commonly known as:  FLORASTOR     tamsulosin 0.4 MG Caps capsule  Commonly known as:  FLOMAX     TIGECYCLINE IV     vitamin E 400 UNIT capsule      TAKE these medications        haloperidol 2 MG/ML solution  Commonly known as:  HALDOL  Take 0.3 mLs (0.6 mg total) by mouth every 4 (four) hours as needed for agitation (or nausea).     LORazepam 2 MG/ML concentrated solution  Commonly known as:  ATIVAN  Take 0.5 mLs (1 mg total) by mouth every 4 (four) hours as needed for anxiety.     morphine CONCENTRATE 10 mg / 0.5 ml concentrated solution  Take 0.25 mLs (5 mg total) by mouth every 12 (twelve) hours as needed for severe pain or shortness of breath.          Diet and Activity recommendation: See Discharge Instructions above   Consults obtained -  Neurology curbside (during acute event of TIA) Palliative Care ID   Major procedures and Radiology Reports - PLEASE review detailed and final reports for all details, in brief -     Dg Chest 2 View  05/03/2015  CLINICAL DATA:  Hypoglycemia.  Cough and congestion. EXAM: CHEST  2 VIEW COMPARISON:  04/10/2015 FINDINGS: There is a right upper extremity PICC line extending into the low SVC. There is mild vascular and interstitial prominence which is unchanged. No consolidation. No effusions. Unchanged mild cardiomegaly. IMPRESSION: Unchanged mild cardiomegaly and vascular/interstitial prominence. No consolidation or effusion. Electronically Signed   By: Andreas Newport M.D.   On: 05/03/2015 01:51   Ct Head Wo Contrast  05/04/2015  CLINICAL DATA:  Sudden onset of altered mental status approximately  45 minutes ago. EXAM: CT HEAD WITHOUT CONTRAST TECHNIQUE: Contiguous axial images were obtained from the base of the skull through the vertex without intravenous contrast. COMPARISON:  05/03/2015 FINDINGS: The ventricles are normal in configuration. There is ventricular and sulcal enlargement reflecting moderate atrophy. No hydrocephalus. There are no parenchymal masses or mass effect. There is no evidence of a recent cortical infarct. Patchy white matter hypoattenuation is noted, stable from the prior exam, consistent with mild chronic microvascular ischemic change. There are no extra-axial masses or abnormal fluid collections. There is no intracranial hemorrhage. The visualized sinuses are clear. IMPRESSION: 1. No acute intracranial abnormalities. No evidence of a recent cortical infarct or of intracranial hemorrhage. 2. Atrophy and mild chronic microvascular ischemic change. These results were called by telephone at the time of interpretation on 05/04/2015 at 11:24 am to nurse Alease Medina, who verbally acknowledged these results. Electronically Signed   By: Lajean Manes M.D.   On: 05/04/2015 11:25   Ct Head Wo Contrast  05/03/2015  CLINICAL DATA:  Hypoglycemia.  Altered mental status. EXAM: CT HEAD WITHOUT CONTRAST TECHNIQUE: Contiguous axial images were obtained from the base of the skull through the vertex without intravenous contrast. COMPARISON:  04/10/2015 FINDINGS: There is no intracranial hemorrhage, mass or evidence of acute infarction. There is no extra-axial fluid collection. There is moderate generalized atrophy. There is white matter hypodensity, chronic and likely due to small vessel disease. No acute intracranial findings are evident. No interval change is evident. No bony abnormalities evident. The visible paranasal sinuses are clear. IMPRESSION: Generalized atrophy and chronic white matter hypodensities, likely small vessel disease. No acute intracranial finding. Electronically Signed   By:  Andreas Newport M.D.   On: 05/03/2015 01:52   Ir Fluoro Guide Cv Line Right  04/29/2015  INDICATION: 82 year old female with a history of foot infection and requirement of antibiotics. EXAM: PICC LINE PLACEMENT WITH ULTRASOUND AND FLUOROSCOPIC GUIDANCE MEDICATIONS: None ANESTHESIA/SEDATION: None FLUOROSCOPY TIME:  Fluoroscopy Time: 0 minutes 12 seconds (1 mGy). COMPLICATIONS: None PROCEDURE: The patient was advised of the possible risks and complications and agreed to undergo the procedure. The patient was then brought to the angiographic suite for the procedure. The right arm was prepped with chlorhexidine, draped in the usual sterile fashion using maximum barrier technique (cap and mask, sterile gown, sterile gloves, large sterile sheet, hand hygiene and cutaneous antisepsis) and infiltrated locally with 1% Lidocaine. Ultrasound demonstrated patency of the right basilic vein, and this was documented with an image. Under real-time ultrasound guidance, this vein was accessed with a 21 gauge micropuncture needle and image documentation was performed. A 0.018 wire was introduced in to the vein. Over this, a 5 [place] single lumen power injectable PICC was advanced to the superior SVC/right atrial junction. Fluoroscopy during the procedure and fluoro spot radiograph confirms appropriate catheter position. The catheter was flushed and covered with asterile dressing. Catheter length: 40 cm IMPRESSION: Status post right basilic vein PICC placement. 40 cm single-lumen power injectable PICC placed. Catheter ready for use. Signed, Dulcy Fanny. Earleen Newport, DO [facility] Radiology Electronically Signed   By: Corrie Mckusick D.O.   On: 04/29/2015 15:05   Ir US Guide Vasc Access Right  04/29/2015  INDICATION: 82 year old female with a history of foot infection and requirement of antibiotics. EXAM: PICC LINE PLACEMENT WITH ULTRASOUND AND FLUOROSCOPIC GUIDANCE MEDICATIONS: None  ANESTHESIA/SEDATION: None FLUOROSCOPY TIME:  Fluoroscopy Time: 0 minutes  12 seconds (1 mGy). COMPLICATIONS: None PROCEDURE: The patient was advised of the possible risks and complications and agreed to undergo the procedure. The patient was then brought to the angiographic suite for the procedure. The right arm was prepped with chlorhexidine, draped in the usual sterile fashion using maximum barrier technique (cap and mask, sterile gown, sterile gloves, large sterile sheet, hand hygiene and cutaneous antisepsis) and infiltrated locally with 1% Lidocaine. Ultrasound demonstrated patency of the right basilic vein, and this was documented with an image. Under real-time ultrasound guidance, this vein was accessed with a 21 gauge micropuncture needle and image documentation was performed. A 0.018 wire was introduced in to the vein. Over this, a 5 [place] single lumen power injectable PICC was advanced to the superior SVC/right atrial junction. Fluoroscopy during the procedure and fluoro spot radiograph confirms appropriate catheter position. The catheter was flushed and covered with asterile dressing. Catheter length: 40 cm IMPRESSION: Status post right basilic vein PICC placement. 40 cm single-lumen power injectable PICC placed. Catheter ready for use. Signed, Dulcy Fanny. Earleen Newport, DO [facility] Radiology Electronically Signed   By: Corrie Mckusick D.O.   On: 04/29/2015 15:05    Micro Results    Recent Results (from the past 240 hour(s))  Urine culture     Status: Abnormal   Collection Time: 05/03/15  2:55 AM  Result Value Ref Range Status   Specimen Description URINE, CATHETERIZED  Final   Special Requests Normal  Final   Culture >=100,000 COLONIES/mL YEAST (A)  Final   Report Status 05/04/2015 FINAL  Final  Blood culture (routine x 2)     Status: None   Collection Time: 05/03/15  4:03 AM  Result Value Ref Range Status   Specimen Description BLOOD RIGHT HAND   Final   Special Requests BOTTLES DRAWN AEROBIC AND ANAEROBIC 5CC  Final   Culture   Final    NO GROWTH 5 DAYS Performed at [facility]    Report Status 05/08/2015 FINAL  Final  Blood culture (routine x 2)     Status: None   Collection Time: 05/03/15  4:09 AM  Result Value Ref Range Status   Specimen Description BLOOD LEFT HAND  Final   Special Requests BOTTLES DRAWN AEROBIC AND ANAEROBIC 5CC  Final   Culture   Final    NO GROWTH 5 DAYS Performed at [facility]    Report Status 05/08/2015 FINAL  Final  C difficile quick scan w PCR reflex     Status: None   Collection Time: 05/06/15  2:51 AM  Result Value Ref Range Status   C Diff antigen NEGATIVE NEGATIVE Final   C Diff toxin NEGATIVE NEGATIVE Final   C Diff interpretation Negative for toxigenic C. difficile  Final       Today   Subjective:   [name] today is nonverbal, no significant events overnight  Objective:   Blood pressure 184/70, pulse 80, temperature 98.5 F (36.9 C), temperature source Axillary, resp. rate 18, height 5\' 8"  (1.727 m), weight 71.668 kg (158 lb), SpO2 98 %.  No intake or output data in the 24 hours ending 05/12/15 1309  Exam  General: Frail, elderly, no apparent distress, eyes open, does not communicate or follow commands  HEENT: NCAT, mucous membranes moist.   Cardiovascular: S1 S2 auscultated, regular  Respiratory: Clear to auscultation  Abdomen: Soft, nontender, nondistended, + bowel sounds  Extremities: warm dry without cyanosis clubbing or edema. RLE wrapped.  Data Review   CBC w Diff:  Lab Results  Component Value Date   WBC 11.4* 05/10/2015   WBC 9.5 04/29/2015   HGB 10.7* 05/10/2015   HCT 31.9* 05/10/2015   PLT 361 05/10/2015   LYMPHOPCT 9 05/03/2015   MONOPCT 6 05/03/2015   EOSPCT 1 05/03/2015   BASOPCT 0 05/03/2015    CMP:  Lab Results  Component Value Date   NA 138 05/10/2015   NA 141 04/29/2015   K 3.5 05/10/2015   CL 107 05/10/2015     CO2 21* 05/10/2015   BUN 19 05/10/2015   BUN 45* 04/29/2015   CREATININE 1.06* 05/10/2015   CREATININE 1.8* 04/29/2015   GLU 121 04/15/2015   PROT 6.1* 05/03/2015   ALBUMIN 2.8* 05/03/2015   BILITOT 0.3 05/03/2015   ALKPHOS 67 05/03/2015   AST 12* 05/03/2015   ALT 12* 05/03/2015  .   Total Time in preparing paper work, data evaluation and todays exam - 35 minutes  Susannah Carbin M.D on 05/12/2015 at 1:09 PM  [facility]   Office  [phone]

## 2015-05-12 NOTE — Progress Notes (Signed)
Daily Progress Note   Patient Name: [name]       Date: 05/12/2015 DOB: [birth year]  Age: 82 y.o. MRN#: [id number] Attending Physician: Albertine Patricia, MD Primary Care Physician: Jani Gravel, MD Admit Date: 05/03/2015  Reason for Consultation/Follow-up: Establishing goals of care  Subjective: I met today with [name].  Her condition continues to worsen.  She is not able to participate in conversation this morning.  I spoke with her daughter and her mother's caregiver has offered to be in her home 24 hours per day until patient passes.  Her daughter would like to transition home with hospice support.  Length of Stay: 9 days  Current Medications: Scheduled Meds:  . aspirin  150 mg Rectal Daily  . enoxaparin (LOVENOX) injection  40 mg Subcutaneous Q24H  . haloperidol lactate  0.5 mg Intravenous BID  . heparin flush  50 Units Intravenous Q12H  . insulin aspart  0-9 Units Subcutaneous TID WC  . insulin glargine  9 Units Subcutaneous Daily  . levothyroxine  75 mcg Intravenous QAC breakfast  . metoprolol  5 mg Intravenous 3 times per day  . saccharomyces boulardii  250 mg Oral BID  . sodium chloride  2,500 mL Intravenous Once  . sodium chloride flush  3 mL Intravenous Q12H    Continuous Infusions: . sodium chloride 75 mL/hr at 05/10/15 2036    PRN Meds: acetaminophen **OR** acetaminophen, haloperidol lactate, hydrALAZINE, ondansetron (ZOFRAN) IV, sodium chloride flush  Physical Exam: Physical Exam             Weak, elderly lady, resting in bed, does not respond to verbal or tactile stimulation S1 S2 Shallow clear anteriorly Abdomen soft No edema, s/p tm amputn Opens eyes, Only repeats "no" repeatedly today.   Vital Signs: BP 184/70 mmHg  Pulse 80  Temp(Src) 98.5 F  (36.9 C) (Axillary)  Resp 18  Ht _0  (1.727 m)  Wt 71.668 kg (158 lb)  BMI 24.03 kg/m2  SpO2 98% SpO2: SpO2: 98 % O2 Device: O2 Device: Not Delivered O2 Flow Rate:    Intake/output summary:  No intake or output data in the 24 hours ending 05/12/15 0855 LBM: Last BM Date: 05/09/15 Baseline Weight: Weight: 29.7 kg (65 lb 7.6 oz) (See Progress Note.) Most recent weight: Weight: 71.668 kg (158  lb)       Palliative Assessment/Data: Flowsheet Rows        Most Recent Value   Intake Tab    Referral Department  Hospitalist   Unit at Time of Referral  Med/Surg Unit   Palliative Care Primary Diagnosis  Neurology   Date Notified  05/03/15   Palliative Care Type  Return patient Palliative Care   Reason for referral  Clarify Goals of Care   Date of Admission  05/03/15   Date first seen by Palliative Care  05/04/15   # of days Palliative referral response time  1 Day(s)   # of days IP prior to Palliative referral  0   Clinical Assessment    Palliative Performance Scale Score  30%   Pain Max last 24 hours  3   Pain Min Last 24 hours  2   Dyspnea Max Last 24 Hours  2   Dyspnea Min Last 24 hours  1   Psychosocial & Spiritual Assessment    Palliative Care Outcomes    Patient/Family meeting held?  No   Who was at the meeting?  patient, daughter over the phone.    Palliative Care follow-up planned  Yes, Facility   Other Treatment Preference Instructions  needs palliative care at SNF rehab on D/C       Additional Data Reviewed: CBC    Component Value Date/Time   WBC 11.4* 05/10/2015 0855   WBC 9.5 04/29/2015   RBC 3.99 05/10/2015 0855   RBC 3.45* 06/07/2008 1040   HGB 10.7* 05/10/2015 0855   HCT 31.9* 05/10/2015 0855   PLT 361 05/10/2015 0855   MCV 79.9 05/10/2015 0855   MCH 26.8 05/10/2015 0855   MCHC 33.5 05/10/2015 0855   RDW 14.8 05/10/2015 0855   LYMPHSABS 1.8 05/03/2015 0139   MONOABS 1.1* 05/03/2015 0139   EOSABS 0.3 05/03/2015 0139   BASOSABS 0.1 05/03/2015 0139      CMP     Component Value Date/Time   NA 138 05/10/2015 0855   NA 141 04/29/2015   K 3.5 05/10/2015 0855   CL 107 05/10/2015 0855   CO2 21* 05/10/2015 0855   GLUCOSE 259* 05/10/2015 0855   BUN 19 05/10/2015 0855   BUN 45* 04/29/2015   CREATININE 1.06* 05/10/2015 0855   CREATININE 1.8* 04/29/2015   CALCIUM 8.1* 05/10/2015 0855   PROT 6.1* 05/03/2015 0553   ALBUMIN 2.8* 05/03/2015 0553   AST 12* 05/03/2015 0553   ALT 12* 05/03/2015 0553   ALKPHOS 67 05/03/2015 0553   BILITOT 0.3 05/03/2015 0553   GFRNONAA 44* 05/10/2015 0855   GFRAA 51* 05/10/2015 0855       Problem List:  Patient Active Problem List   Diagnosis Date Noted  . Palliative care encounter   . Acute delirium   . Diarrhea   . Encounter for palliative care   . Goals of care, counseling/discussion   . Urinary tract infection, site not specified   . Chronic atrial fibrillation ([place])   . UTI (lower urinary tract infection) 05/03/2015  . Sepsis ([place]) 05/03/2015  . Acute encephalopathy 05/03/2015  . Hypothermia 05/03/2015  . Hypoglycemia   . TIA (transient ischemic attack)   . Type 2 diabetes mellitus with circulatory disorder ([place])   . Dementia   . CVA (cerebral infarction) 04/10/2015  . S/P transmetatarsal amputation of right foot ([place]) 04/10/2015  . History of CVA w/ right side deficits 04/05/2015  . Chronic osteomyelitis ([place]) 04/05/2015  . Diabetes mellitus  with renal manifestations, uncontrolled ([place]) 10/21/2014  . Chronic ulcer of left foot ([place]) 10/21/2014  . Chronic diastolic CHF (congestive heart failure) ([place]) 10/21/2014  . Diabetic neuropathy ([place]) 08/09/2014  . Coronary atherosclerosis of native coronary artery 06/07/2012  . Chronic kidney disease (CKD), stage IV (severe) ([place]) 04/12/2012  . Hypothyroidism 04/21/2011  . HYPERTENSION, BENIGN ESSENTIAL 12/08/2006     Palliative Care Assessment & Plan    1.Code Status:  DNR    Code Status Orders        Start     Ordered    05/04/15 1441  Do not attempt resuscitation (DNR)   Continuous    Question Answer Comment  In the event of cardiac or respiratory ARREST Do not call a "code blue"   In the event of cardiac or respiratory ARREST Do not perform Intubation, CPR, defibrillation or ACLS   In the event of cardiac or respiratory ARREST Use medication by any route, position, wound care, and other measures to relive pain and suffering. May use oxygen, suction and manual treatment of airway obstruction as needed for comfort.      05/04/15 1440    Code Status History    Date Active Date Inactive Code Status Order ID Comments User Context   05/03/2015  4:53 AM 05/04/2015  2:40 PM Full Code [id number]  Ivor Costa, MD ED   04/10/2015  6:53 PM 04/12/2015 11:07 PM Full Code [id number]  Samella Parr, NP Inpatient   04/04/2015  9:56 PM 04/08/2015  6:23 PM Full Code [id number]  Norval Morton, MD ED   10/22/2014 11:27 AM 10/25/2014  8:06 PM DNR [id number]  Thurnell Lose, MD Inpatient   10/21/2014  5:31 PM 10/22/2014 11:27 AM Full Code [id number]  Robbie Lis, MD Inpatient   08/08/2014  5:20 PM 08/13/2014  6:08 PM Full Code [id number]  Theodis Blaze, MD Inpatient   11/18/2013 12:57 PM 11/21/2013  4:33 PM Full Code [id number]  Barton Dubois, MD Inpatient   08/28/2013 12:55 AM 08/30/2013  7:16 PM Full Code [id number]  Hosie Poisson, MD Inpatient   04/14/2012  7:40 PM 04/17/2012  5:41 PM Full Code [id number]  Newt Minion, MD Inpatient   04/12/2012 12:18 AM 04/14/2012  7:40 PM Full Code [id number]  Janell Quiet, MD Inpatient   04/21/2011  9:40 PM 04/27/2011  7:19 PM Full Code [id number]  Janae Bridgeman, RN Inpatient       2. Goals of Care/Additional Recommendations: Her daughter would like to transition home with hospice support.  3. Symptom Management:      On discharge, would recommend scripts for: - Morphine Concentrate 75m/0.5ml: 548m(0.2536msublingual every 1 hour as needed for pain or shortness of breath: Disp 46m61mLorazepam 2mg/1m concentrated solution: 1mg (55mml) s98mingual every 4 hours as needed for anxiety: Disp 46ml - 9mol 2mg/ml s6mtion: 0.5mg (0.2576m subl19mal every 4 hours as needed for agitation or nausea: Disp 46ml    4. 61miative Prophylaxis:   Delirium Protocol  Standard delirium management (adapted from NICE guidelines 2011 for prevention of delirium):  Provide continuity of care when possible (avoid frequent changing of surroundings and staff).  Frequent reorientation to time with:  A clock should always be visible.  Make sure Calendar/white board is updated.  Lights on/blinds open during the day and off/closed at night.  Encourage frequent family visits.  Monitor for and treat dehydration/constipation.  Optimize oxygen saturation.  Avoid catheters and IV's when possible and look for/treat infections.  Encourage early mobility.  Assess and treat pain.  Ensure adequate nutrition and functioning dentures.  Address reversible causes of hearing and visual impairment:  Use pocket talker if hearing aids are unavailable.  Avoid sleep disturbance (normalize sleep/wake cycle).  Minimize disturbances and consider NOT obtaining vitals at night if possible.  Review Medications to avoid polypharmacy and avoid deliriogenic medications when possible:  Benzodiazepines.  Dihydropyridines.  Antihistamines.  Anticholinergics.  (Possibly avoid: H2 blockers, tricyclic antidepressants, antiparkinson medications, steroids, NSAID's).   5. Prognosis: Hours - Days.  Her condition has continued to decline.  She is not taking in nutrition or hydration.   6. Discharge Planning: Home with hospice support  Care plan was discussed with  Patient, her daughter   Thank you for allowing the Palliative Medicine Team to assist in the care of this patient.   Time In: 0815 Time Out: 0845 Total Time 30 Prolonged Time Billed  no        [place], MD  05/12/2015, 8:55 AM  Please contact Palliative  Medicine Team phone at [phone] for questions and concerns.

## 2015-05-12 NOTE — Progress Notes (Signed)
[id number] [name],BSN,RN,CCM: Transport paper and home prescriptions gathered for transport by ptar given to floor RN.

## 2015-05-12 NOTE — Progress Notes (Signed)
PT Cancellation Note  Patient Details Name: LATRISE BURROUS MRN: [id number] DOB: 1920-01-07   Cancelled Treatment:    Reason Eval/Treat Not Completed: Per palliative, medical decline. Daughter plans to take pt home with hospice support. Do not feel pt is appropriate for acute PT services. Will sign off.    Weston Anna, MPT Pager: [phone]

## 2015-05-12 NOTE — Discharge Instructions (Signed)
Further management as per home hospice agency - Feed  for comfort

## 2015-05-26 DEATH — deceased

## 2016-06-16 ENCOUNTER — Encounter (HOSPITAL_COMMUNITY): Payer: Self-pay | Admitting: Orthopaedic Surgery

## 2017-02-20 IMAGING — MR MR MRA HEAD W/O CM
9 of 11 series · 31 of 48 positions shown · non-contrast
Comparison: 04/10/2015 CT.  02/04/2012 MR and MR angiogram.

CLINICAL DATA: [AGE] diabetic hypertensive female with prior
stroke presenting with near syncopal episode and slurred speech.
Subsequent encounter.

EXAM:
MRI HEAD WITHOUT CONTRAST
MRA HEAD WITHOUT CONTRAST
TECHNIQUE: Multiplanar, multiecho pulse sequences of the brain and surrounding
structures were obtained without intravenous contrast. Angiographic
images of the head were obtained using MRA technique without
contrast.

[Series 3: DWI · axial · 3.0mm · 1.09mm/px · z∈[-50,+79]mm · 7 of 90 slices shown (1 of 4)]
[im 1/90]
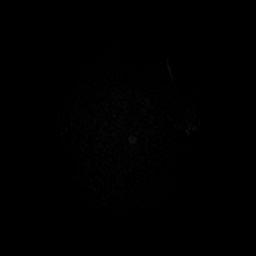
[im 15/90]
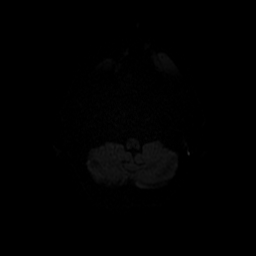
[im 30/90]
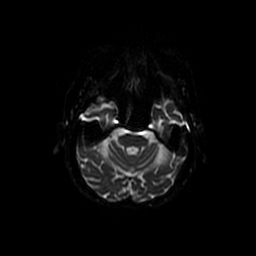
[im 45/90]
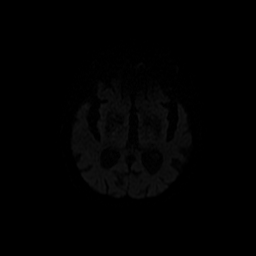
[im 60/90]
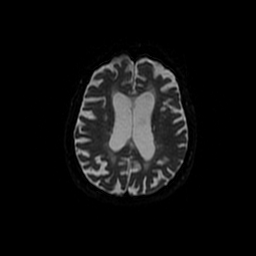
[im 75/90]
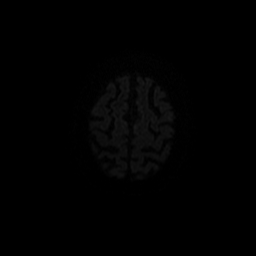
[im 90/90]
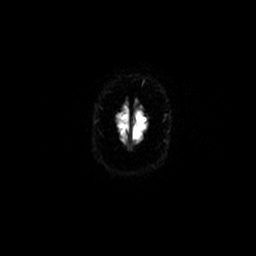

[Series 4: T1 · sagittal · 5.0mm · 0.47mm/px · 2 of 23 slices shown]
[im 1/23]
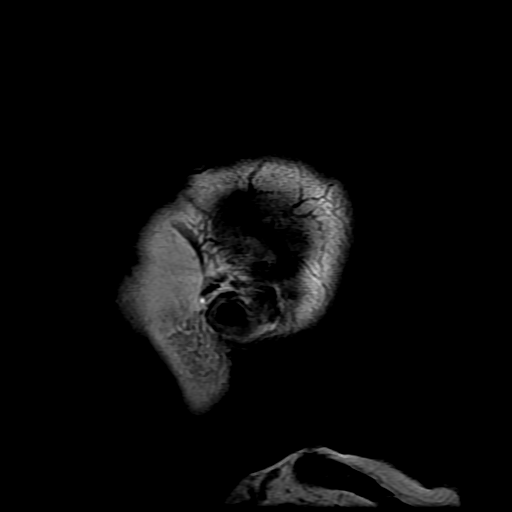
[im 23/23]
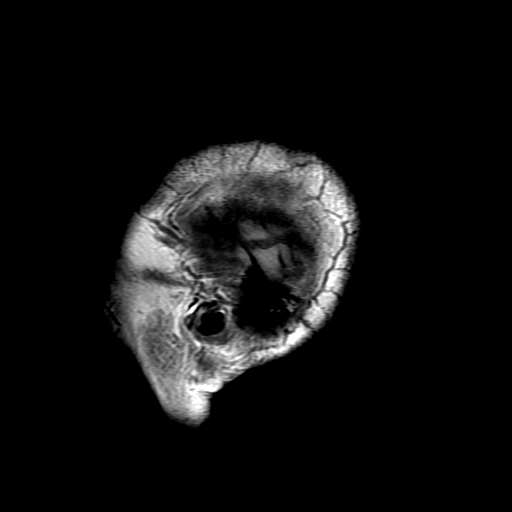

[Series 5: (id) mt fs · axial · 1.4mm · 0.43mm/px · z∈[-56,-1]mm · 5 of 152 slices shown]
[im 1/152]
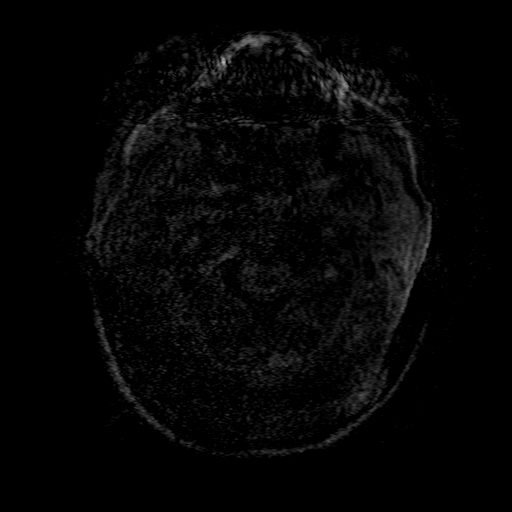
[im 28/152]
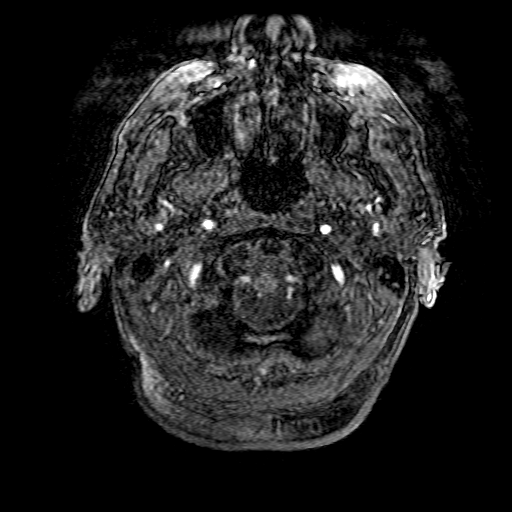
[im 42/152]
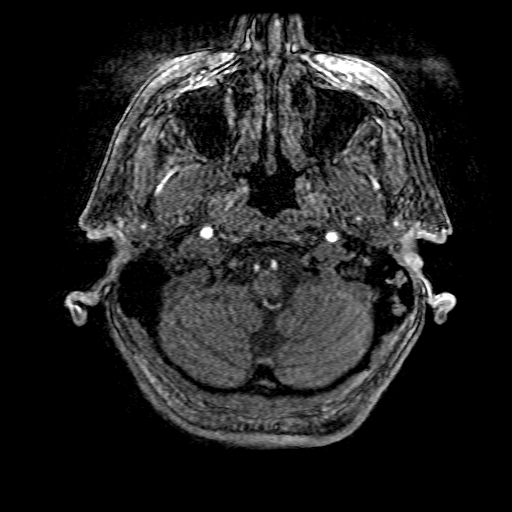
[im 69/152]
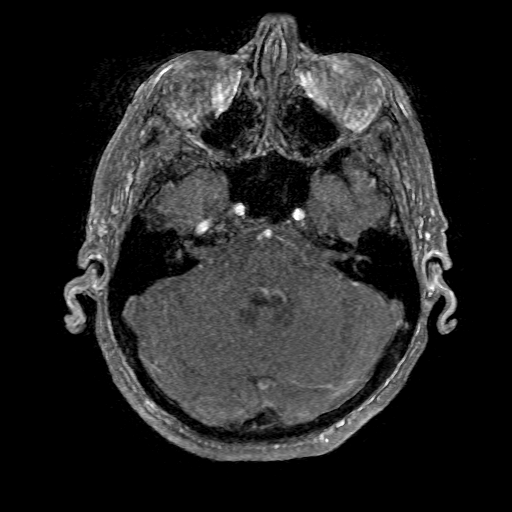
[im 83/152]
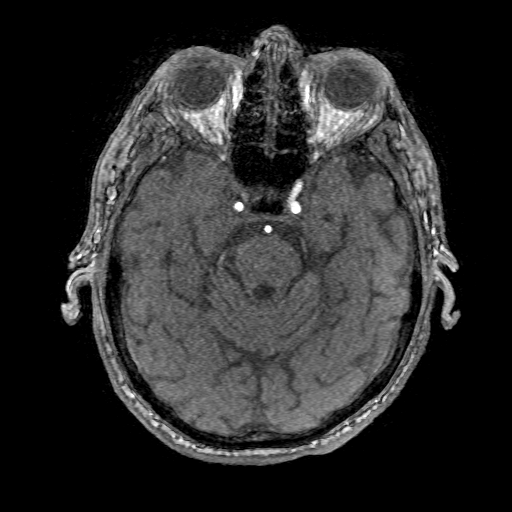

[Series 6: T2 · axial · 5.0mm · 0.43mm/px · z∈[-53,+76]mm · 2 of 23 slices shown (1 of 2)]
[im 1/23]
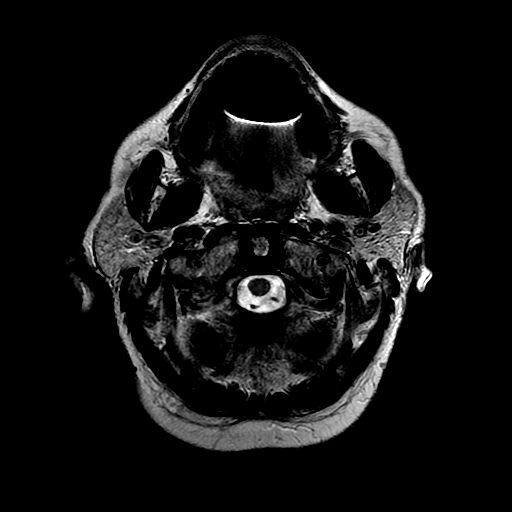
[im 23/23]
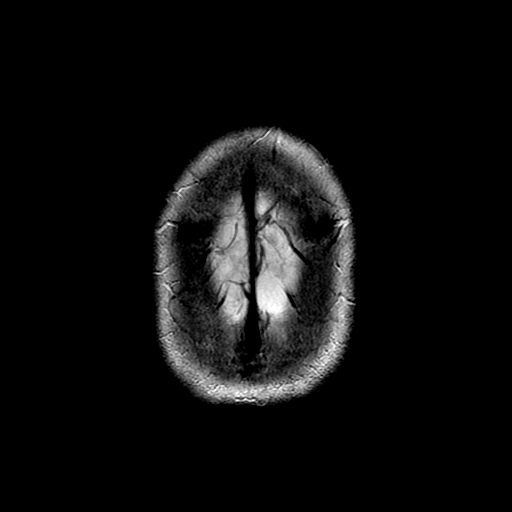

[Series 7: DWI · coronal · 5.0mm · 1.09mm/px · 5 of 60 slices shown (2 of 4)]
[im 1/60]
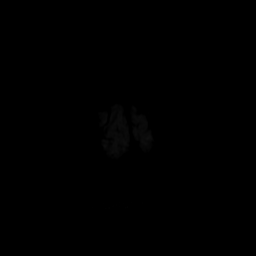
[im 15/60]
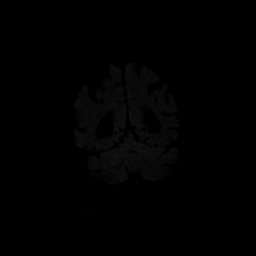
[im 30/60]
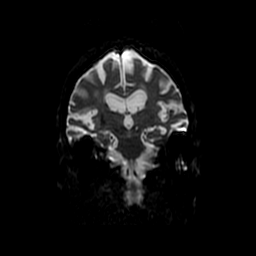
[im 45/60]
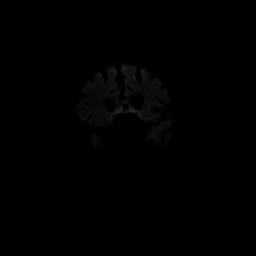
[im 60/60]
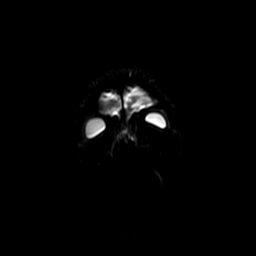

[Series 8: FLAIR · axial · 5.0mm · 0.43mm/px · z∈[-53,+76]mm · 2 of 23 slices shown]
[im 1/23]
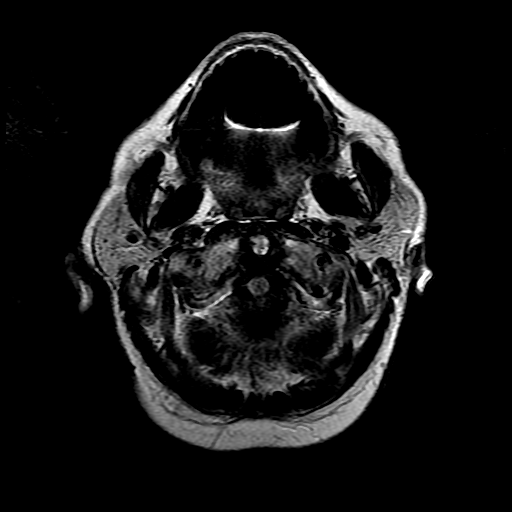
[im 23/23]
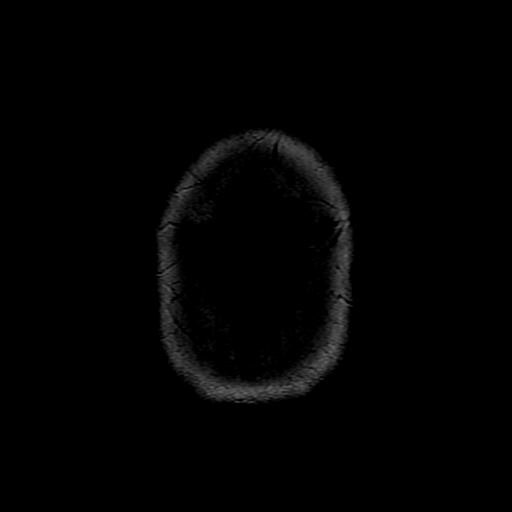

[Series 11: T2 · coronal · 5.0mm · 0.39mm/px · 2 of 23 slices shown (2 of 2)]
[im 1/23]
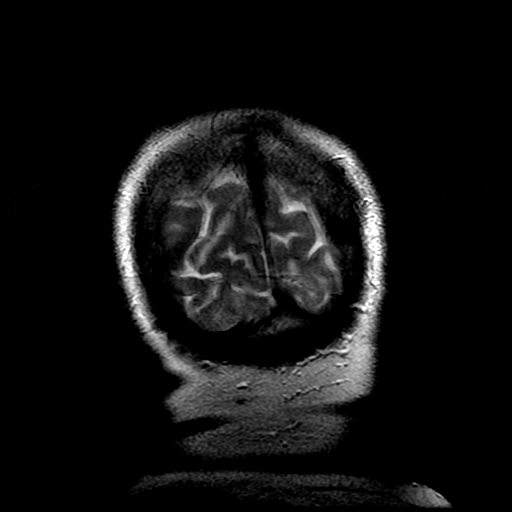
[im 23/23]
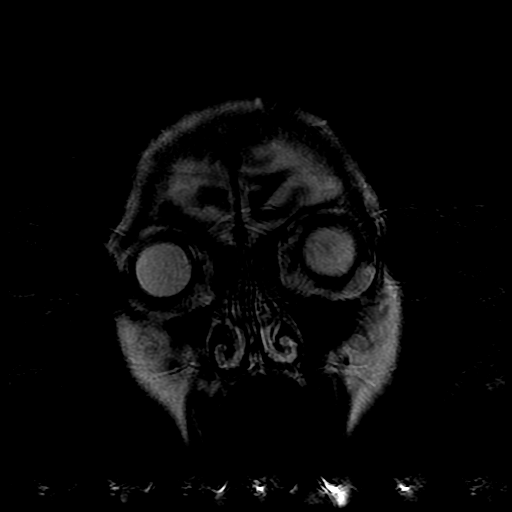

[Series 300: DWI · axial · 3.0mm · 1.09mm/px · z∈[-50,+79]mm · 4 of 45 slices shown (3 of 4)]
[im 1/45]
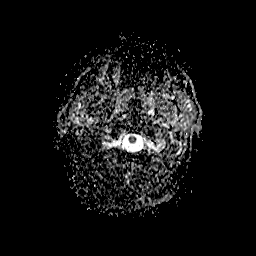
[im 15/45]
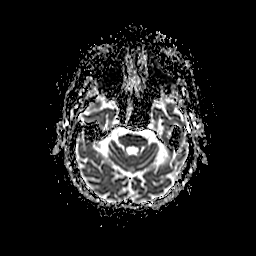
[im 30/45]
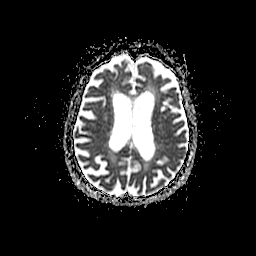
[im 45/45]
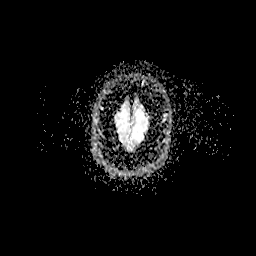

[Series 700: DWI · coronal · 5.0mm · 1.09mm/px · 2 of 30 slices shown (4 of 4)]
[im 1/30]
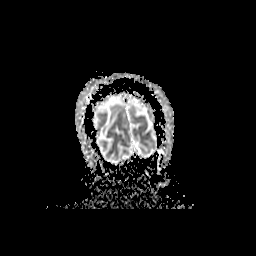
[im 30/30]
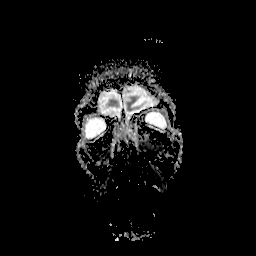

[31 of 48 positions shown; findings below may reference images not displayed]

FINDINGS: MRI HEAD FINDINGS

No acute infarct or intracranial hemorrhage.

Moderate global atrophy without hydrocephalus.

Mild to moderate chronic small vessel disease changes.

No intracranial mass lesion noted on this unenhanced exam.

Partial opacification left mastoid air cells without obstructing
lesion of eustachian tube.

Post lens replacement otherwise orbital structures unremarkable.

Cervical spondylotic changes with spinal stenosis and cord
flattening most notable C2-3 followed by the C3-4 and C4-5 level.

MRA HEAD FINDINGS

Anterior circulation without medium or large size vessel significant
stenosis or occlusion.

Moderate moderate narrowing of portions of the middle cerebral
artery branches and A2 segment anterior cerebral artery bilaterally.

No high-grade stenosis distal vertebral arteries or basilar artery.

Moderate posterior circulation branch vessel narrowing and
irregularity including high-grade stenosis proximal and distal
aspect of the posterior cerebral arteries more notable on the right.

No aneurysm noted.
IMPRESSION: MRI HEAD

No acute infarct or intracranial hemorrhage.

Moderate global atrophy without hydrocephalus.

Mild to moderate chronic small vessel disease changes.

No intracranial mass lesion noted on this unenhanced exam.

Partial opacification left mastoid air cells.

Cervical spondylotic changes with spinal stenosis and cord
flattening most notable C2-3 followed by the C3-4 and C4-5 level.

MRA HEAD

Anterior circulation without medium or large size vessel significant
stenosis or occlusion.

Moderate moderate narrowing of portions of the middle cerebral
artery branches and A2 segment anterior cerebral artery bilaterally.

No high-grade stenosis distal vertebral arteries or basilar artery.

Moderate posterior circulation branch vessel narrowing and
irregularity.

High-grade stenosis proximal and distal aspect of the posterior
cerebral arteries more notable on the right.

## 2017-02-20 IMAGING — CT CT HEAD W/O CM
2 series · 16 of 30 positions shown, 20 images · non-contrast
Comparison: 08/27/2013

CLINICAL DATA: Slurred speech

EXAM:
CT HEAD WITHOUT CONTRAST
TECHNIQUE: Contiguous axial images were obtained from the base of the skull
through the vertex without intravenous contrast.

[Series 201: head w/o, idose (1) · axial · non-contrast · 0.38mm/px · z∈[+71,+196]mm · 13 of 31 slices shown, 17 images]
[im 3/31  brain]
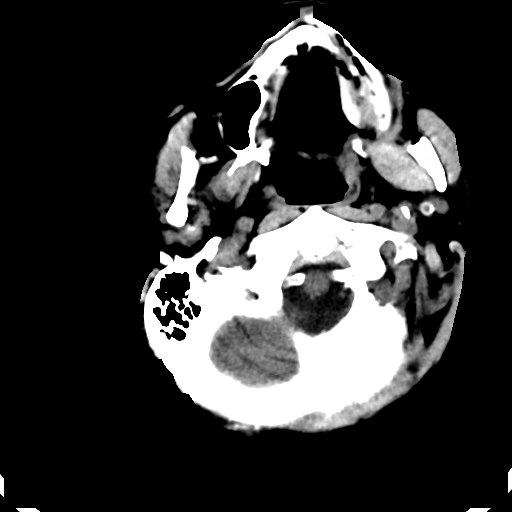
[im 3/31  bone]
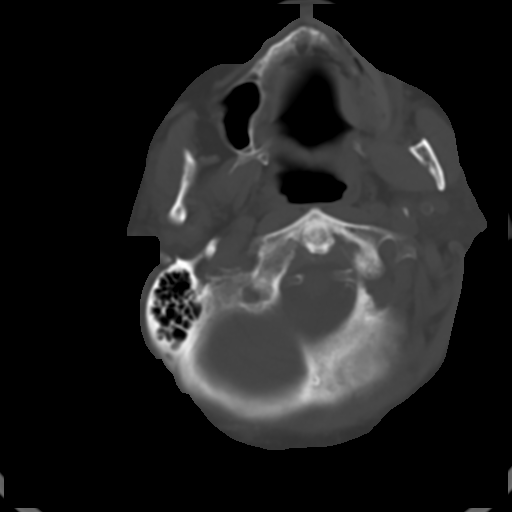
[im 5/31  brain]
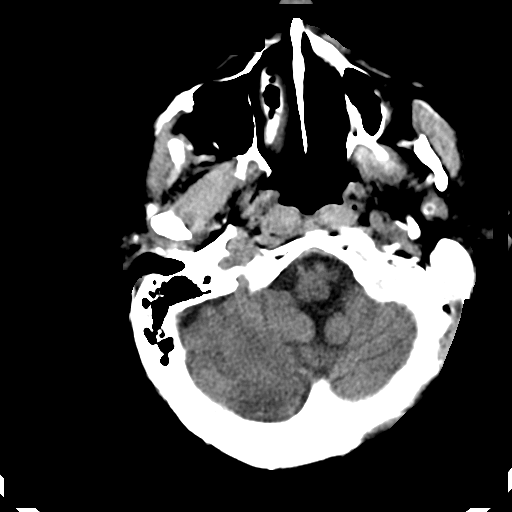
[im 7/31  brain]
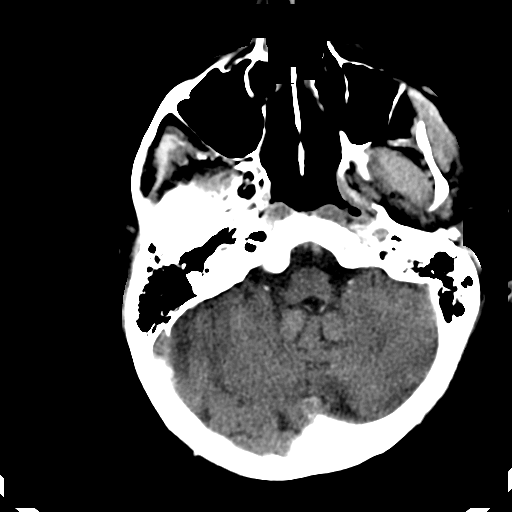
[im 9/31  brain]
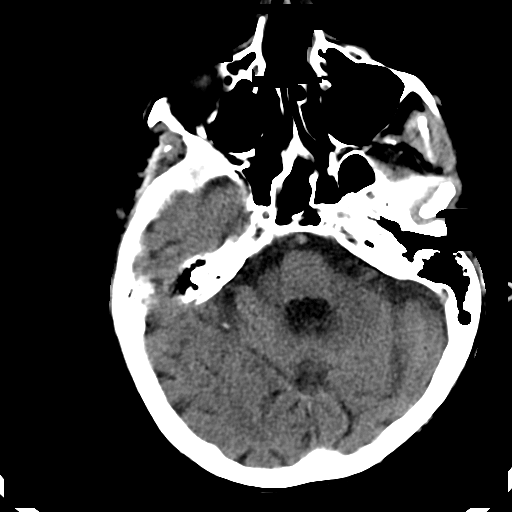
[im 11/31  brain]
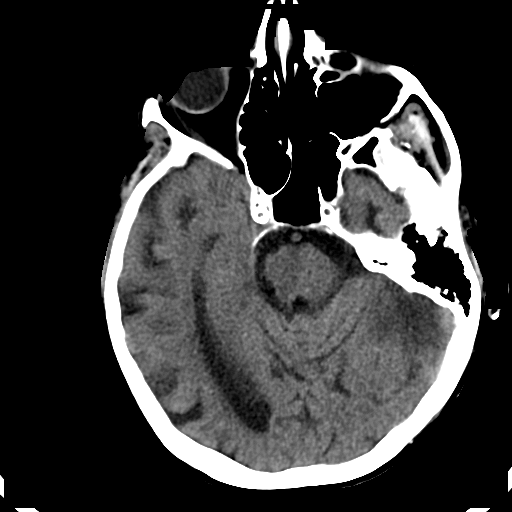
[im 11/31  bone]
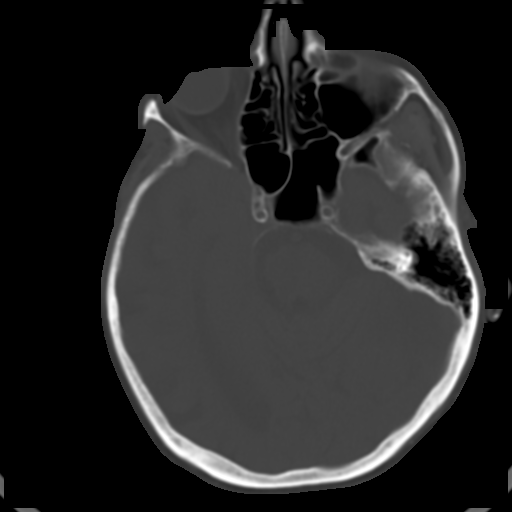
[im 13/31  brain]
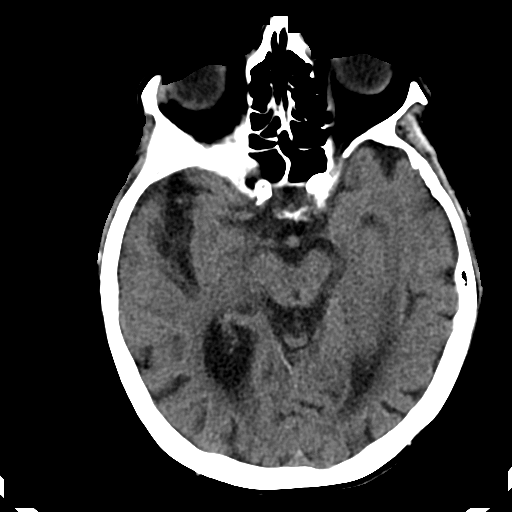
[im 16/31  brain]
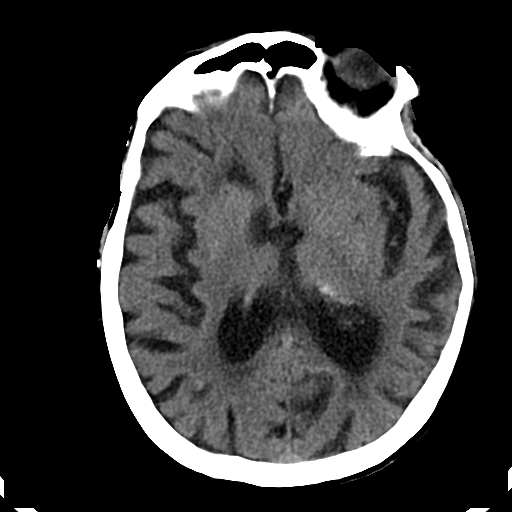
[im 18/31  brain]
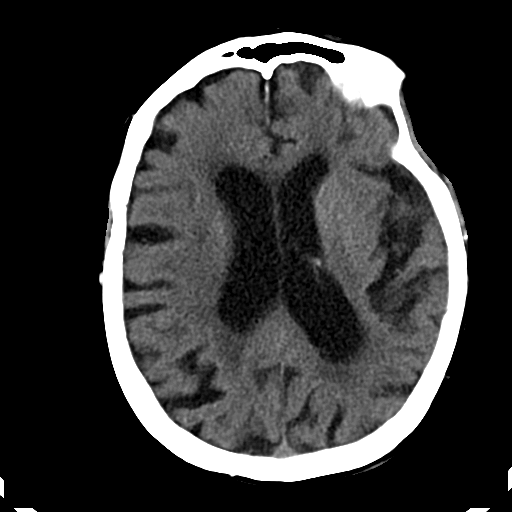
[im 20/31  brain]
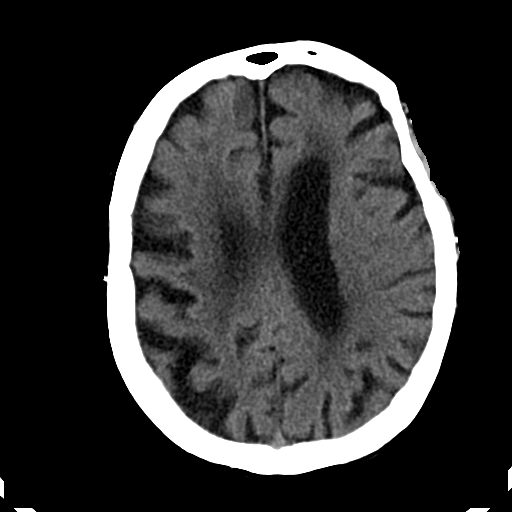
[im 20/31  bone]
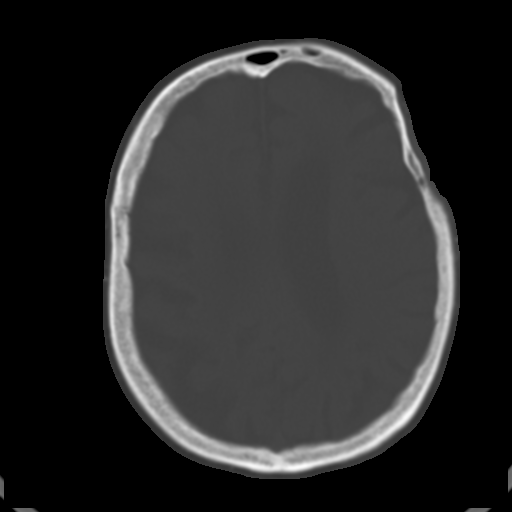
[im 22/31  brain]
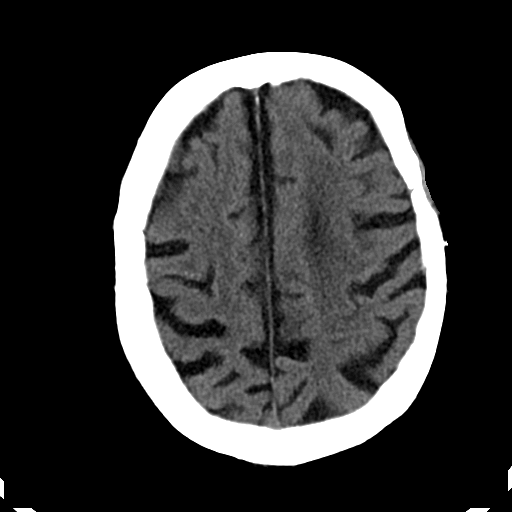
[im 24/31  brain]
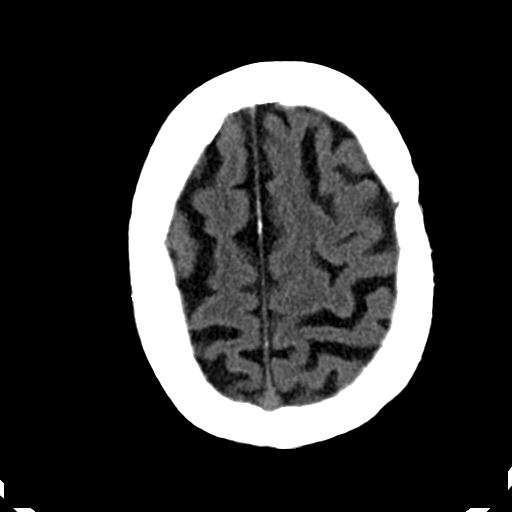
[im 26/31  brain]
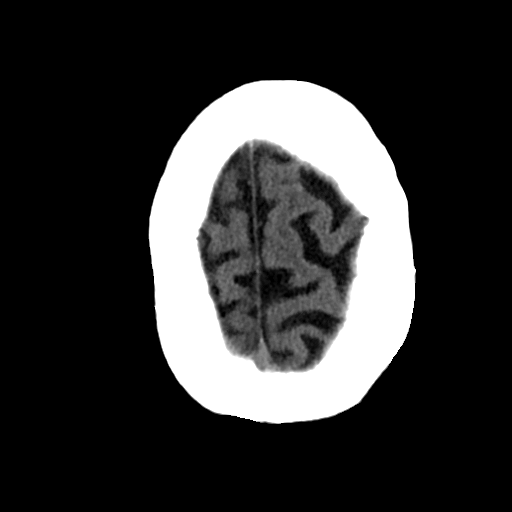
[im 28/31  brain]
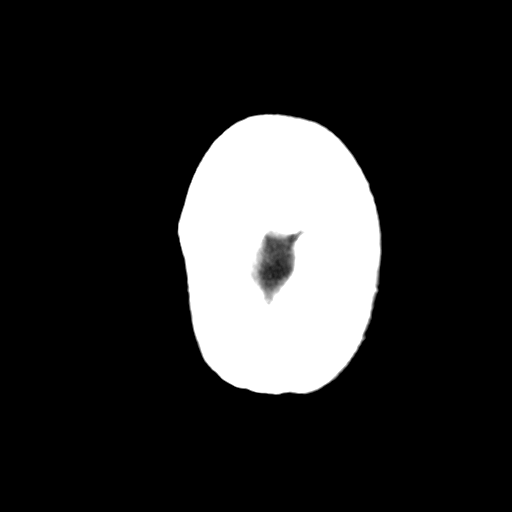
[im 28/31  bone]
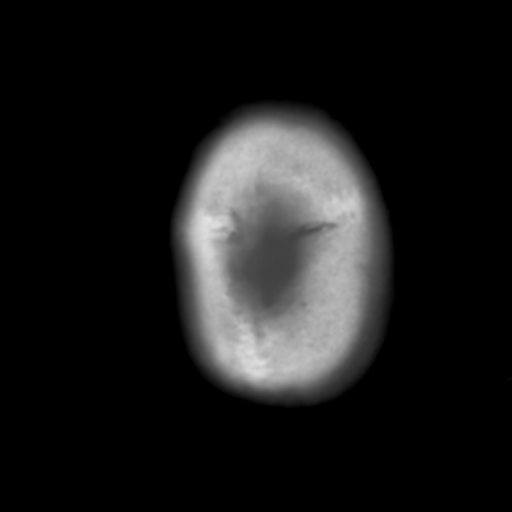

[Series 202: head w/o bone, idose (1) · axial · non-contrast · 0.38mm/px · z∈[+71,+111]mm · 3 of 31 slices shown]
[im 3/31  bone]
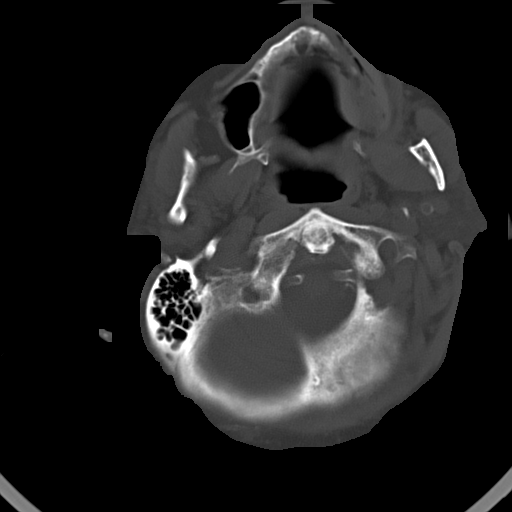
[im 7/31  bone]
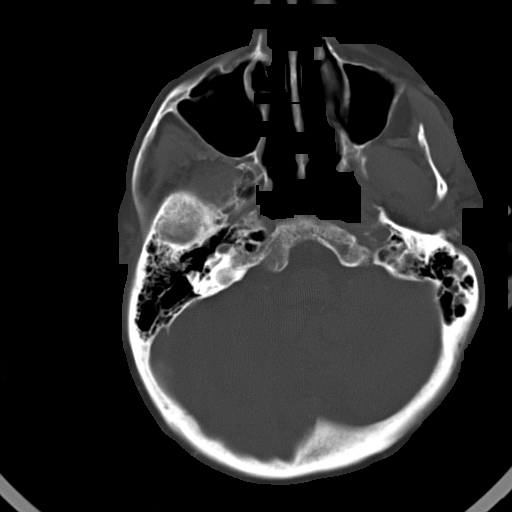
[im 11/31  bone]
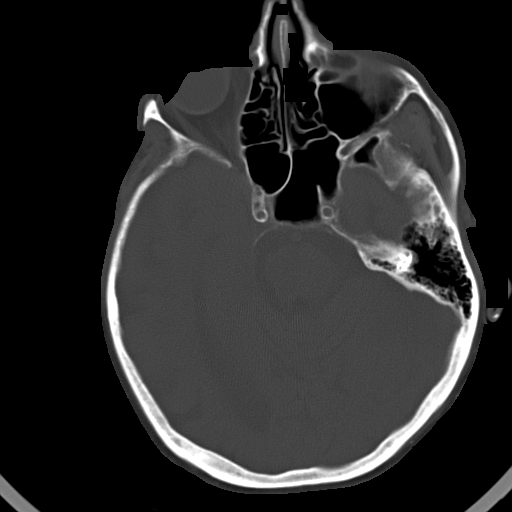

[16 of 30 positions shown; findings below may reference images not displayed]

FINDINGS: The bony calvarium is intact. No gross soft tissue abnormality is
noted. Diffuse atrophic changes are noted. Areas of chronic white
matter ischemic change are seen. No findings to suggest acute
hemorrhage, acute infarction or space-occupying mass lesion are
noted.
IMPRESSION: Chronic atrophic and ischemic changes.  No acute abnormality noted.

These results were called by telephone at the time of interpretation
on 04/10/2015 at [DATE] to Dr. Aidee, who verbally acknowledged
these results.

## 2017-03-15 IMAGING — CT CT HEAD W/O CM
2 series · 17 of 30 positions shown, 20 images · non-contrast
Comparison: 04/10/2015

CLINICAL DATA: Hypoglycemia.  Altered mental status.

EXAM:
CT HEAD WITHOUT CONTRAST
TECHNIQUE: Contiguous axial images were obtained from the base of the skull
through the vertex without intravenous contrast.

[Series 2: head w/o · axial · non-contrast · 0.45mm/px · z∈[+1509,+1624]mm · 9 of 29 slices shown, 12 images]
[im 3/29  brain]
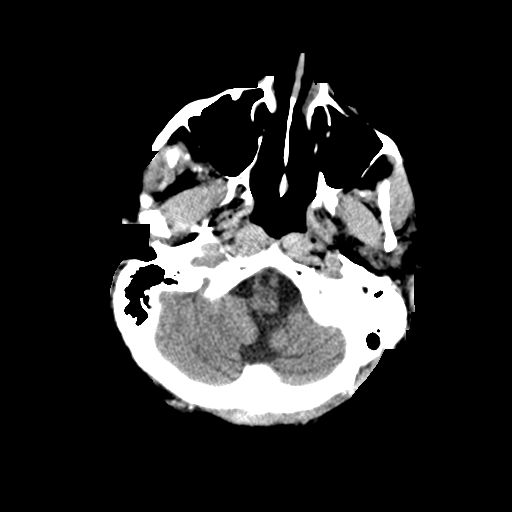
[im 3/29  bone]
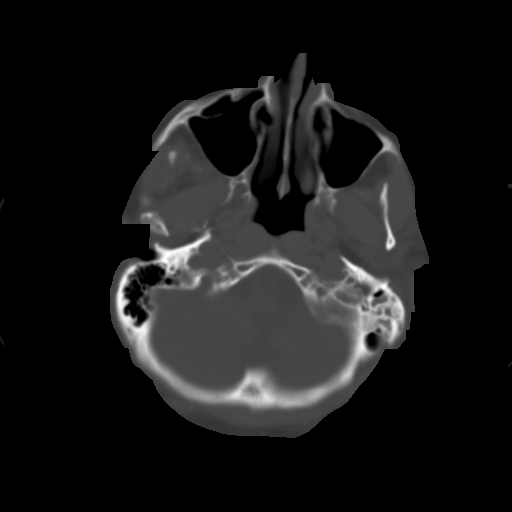
[im 6/29  brain]
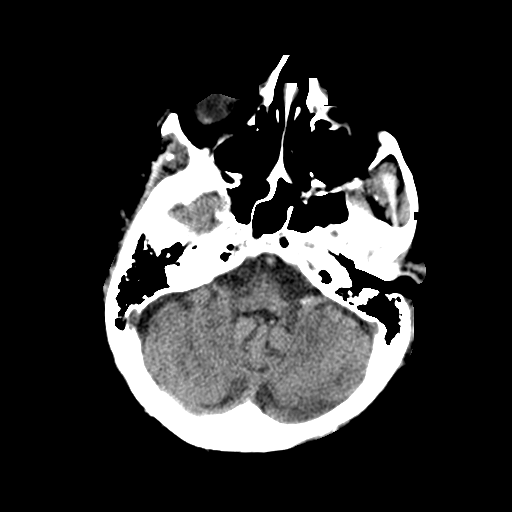
[im 9/29  brain]
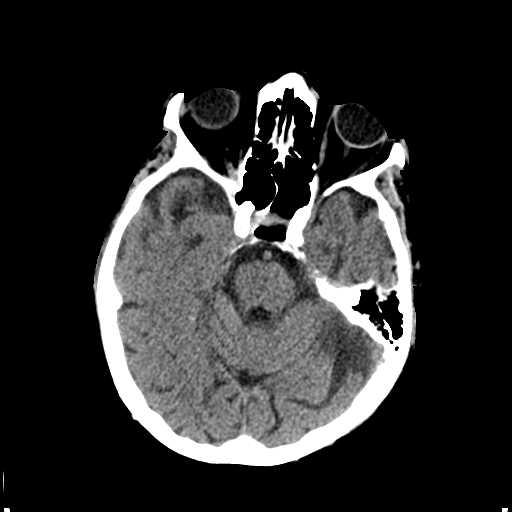
[im 12/29  brain]
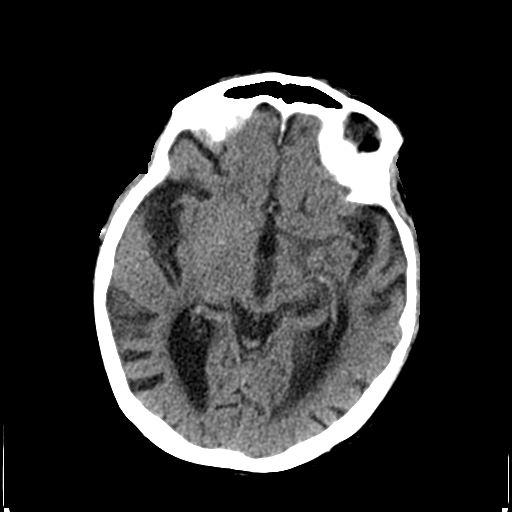
[im 15/29  brain]
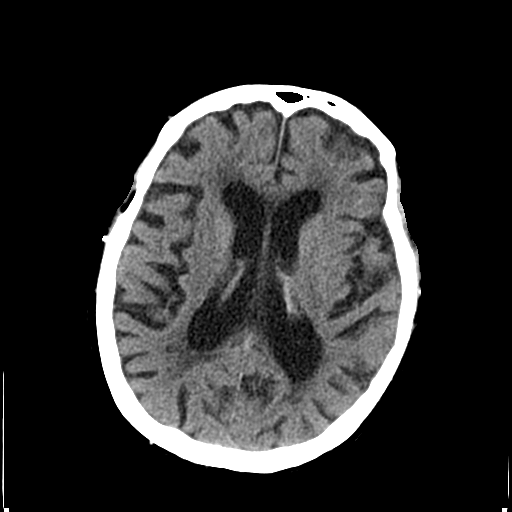
[im 15/29  bone]
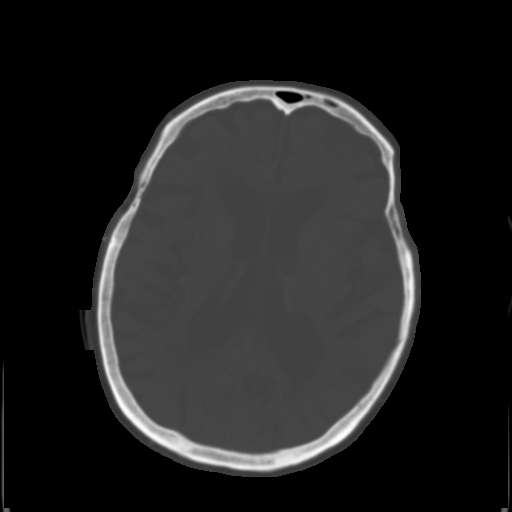
[im 17/29  brain]
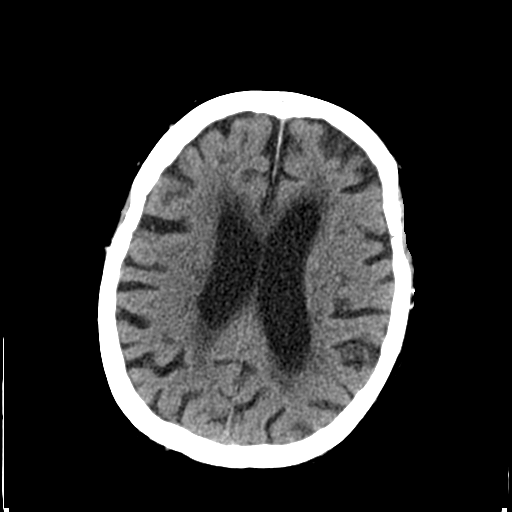
[im 20/29  brain]
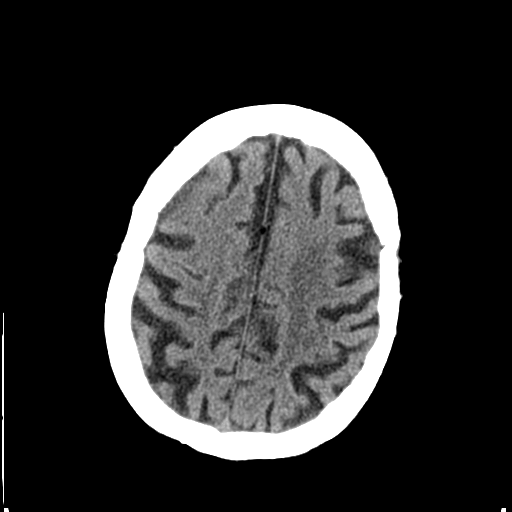
[im 23/29  brain]
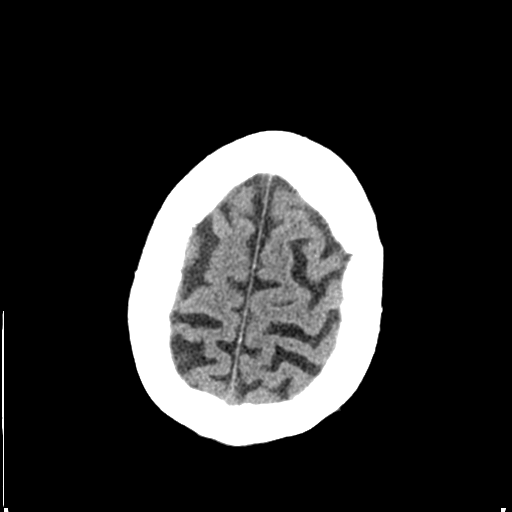
[im 26/29  brain]
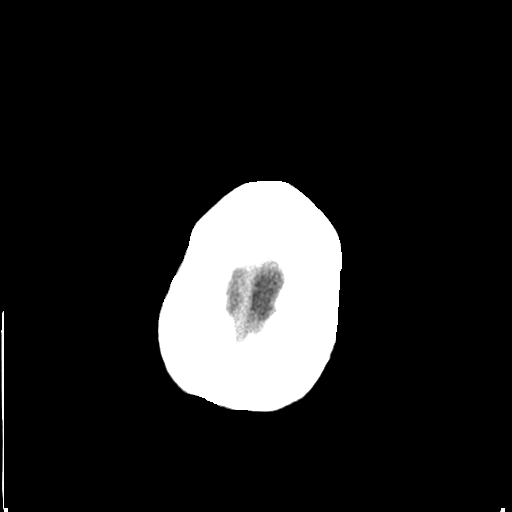
[im 26/29  bone]
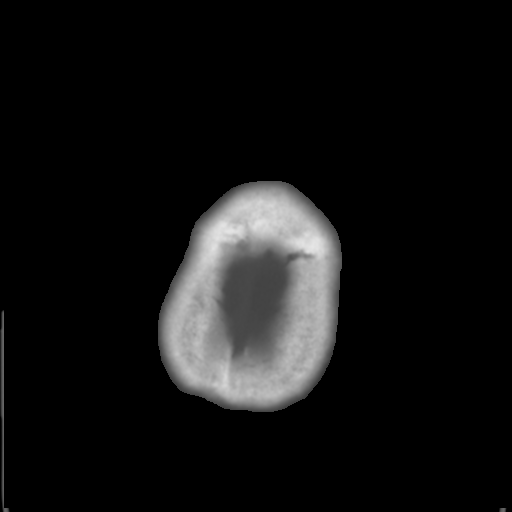

[Series 3: bone windows · axial · 0.45mm/px · z∈[+1514,+1622]mm · 8 of 48 slices shown]
[im 6/48  bone]
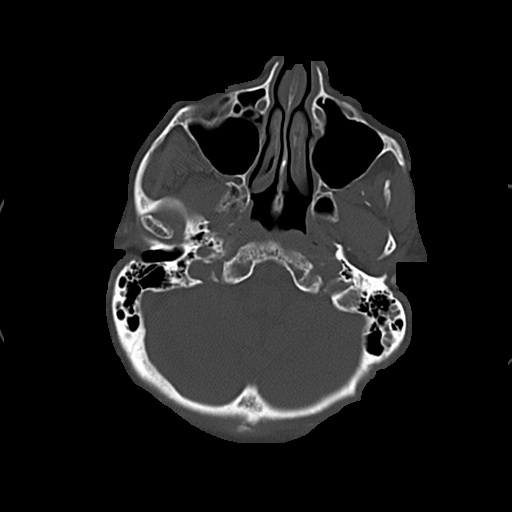
[im 11/48  bone]
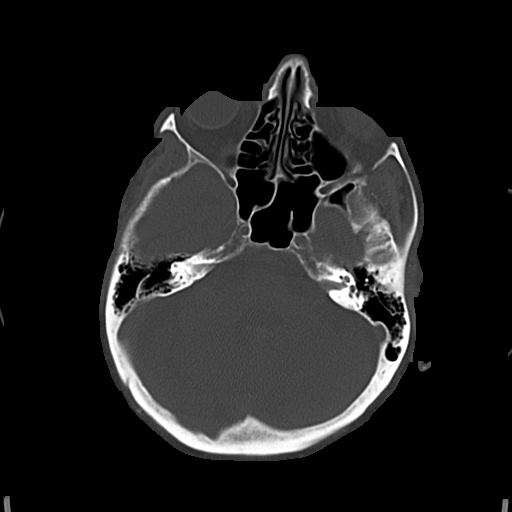
[im 16/48  bone]
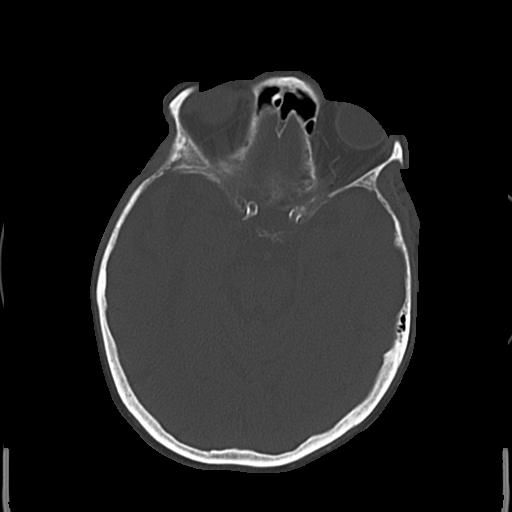
[im 21/48  bone]
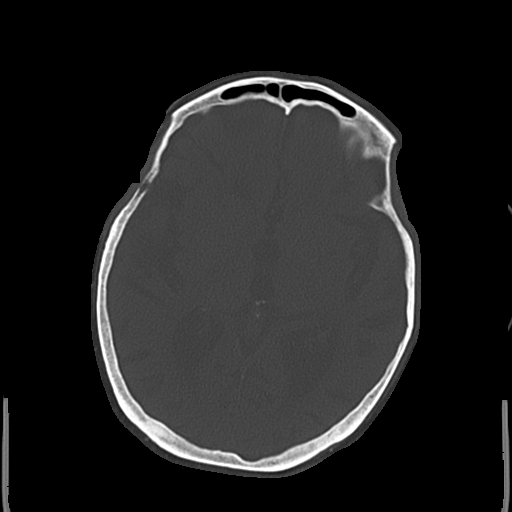
[im 27/48  bone]
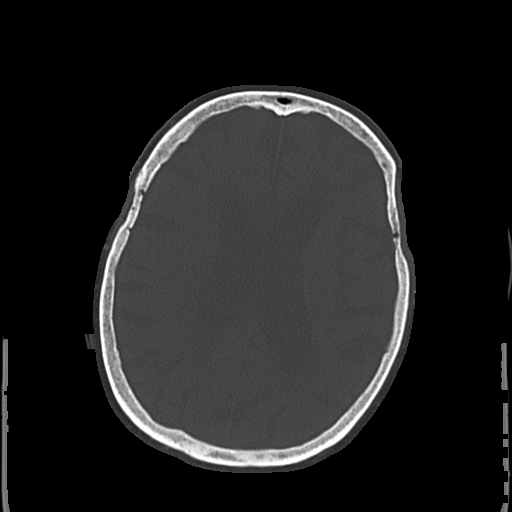
[im 32/48  bone]
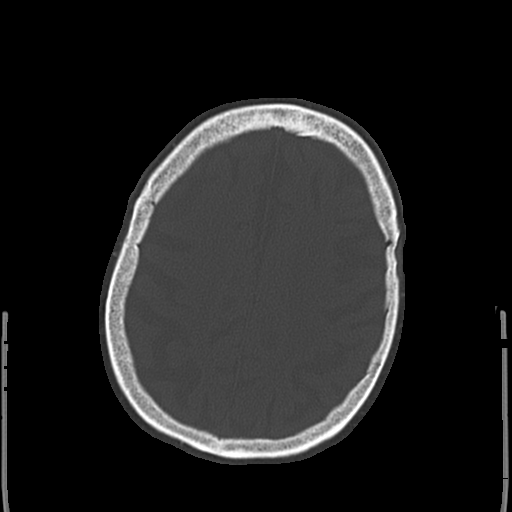
[im 37/48  bone]
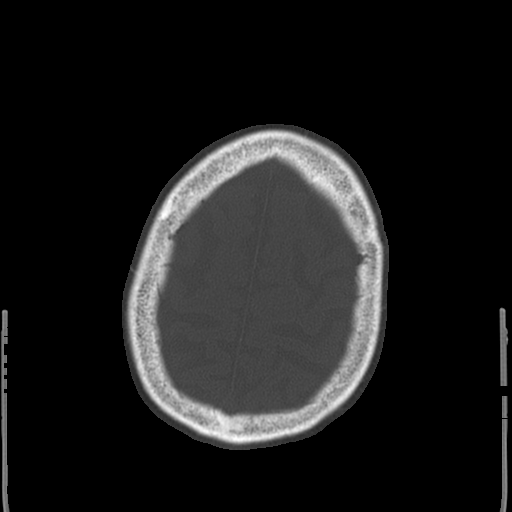
[im 42/48  bone]
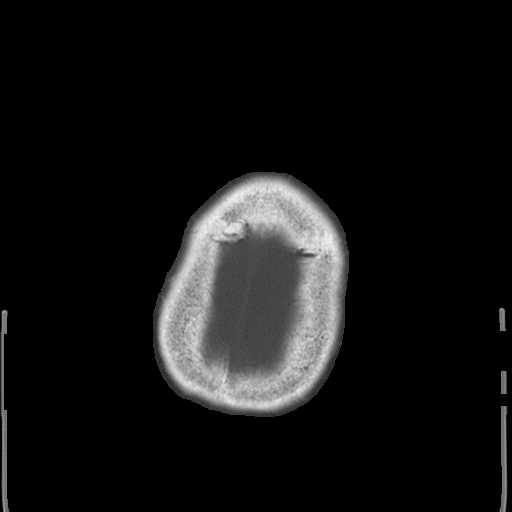

[17 of 30 positions shown; findings below may reference images not displayed]

FINDINGS: There is no intracranial hemorrhage, mass or evidence of acute
infarction. There is no extra-axial fluid collection. There is
moderate generalized atrophy. There is white matter hypodensity,
chronic and likely due to small vessel disease. No acute
intracranial findings are evident. No interval change is evident. No
bony abnormalities evident. The visible paranasal sinuses are clear.
IMPRESSION: Generalized atrophy and chronic white matter hypodensities, likely
small vessel disease. No acute intracranial finding.
